# Patient Record
Sex: Female | Born: 1980 | Race: Black or African American | Hispanic: No | Marital: Single | Smoking: Current every day smoker
Health system: Southern US, Community
[De-identification: ages and names within clinical notes are randomized; demographics above are authoritative.]

## PROBLEM LIST (undated history)

## (undated) DIAGNOSIS — R569 Unspecified convulsions: Secondary | ICD-10-CM

## (undated) DIAGNOSIS — F445 Conversion disorder with seizures or convulsions: Secondary | ICD-10-CM

## (undated) DIAGNOSIS — K3184 Gastroparesis: Secondary | ICD-10-CM

## (undated) DIAGNOSIS — IMO0002 Reserved for concepts with insufficient information to code with codable children: Secondary | ICD-10-CM

## (undated) DIAGNOSIS — E119 Type 2 diabetes mellitus without complications: Secondary | ICD-10-CM

## (undated) DIAGNOSIS — M359 Systemic involvement of connective tissue, unspecified: Secondary | ICD-10-CM

## (undated) DIAGNOSIS — M329 Systemic lupus erythematosus, unspecified: Secondary | ICD-10-CM

## (undated) HISTORY — PX: CHOLECYSTECTOMY: SHX55

## (undated) HISTORY — PX: APPENDECTOMY: SHX54

---

## 2004-03-25 ENCOUNTER — Emergency Department: Payer: Self-pay | Admitting: Emergency Medicine

## 2004-05-20 ENCOUNTER — Emergency Department: Payer: Self-pay | Admitting: Internal Medicine

## 2004-05-24 ENCOUNTER — Inpatient Hospital Stay: Payer: Self-pay | Admitting: Anesthesiology

## 2007-07-20 ENCOUNTER — Ambulatory Visit: Payer: Self-pay | Admitting: Gynecology

## 2007-07-22 ENCOUNTER — Ambulatory Visit (HOSPITAL_COMMUNITY): Admission: RE | Admit: 2007-07-22 | Discharge: 2007-07-22 | Payer: Self-pay | Admitting: Gynecology

## 2007-07-23 ENCOUNTER — Ambulatory Visit: Payer: Self-pay | Admitting: Gynecology

## 2007-07-29 ENCOUNTER — Ambulatory Visit (HOSPITAL_COMMUNITY): Admission: RE | Admit: 2007-07-29 | Discharge: 2007-07-29 | Payer: Self-pay | Admitting: Gynecology

## 2007-08-18 ENCOUNTER — Ambulatory Visit (HOSPITAL_COMMUNITY): Admission: RE | Admit: 2007-08-18 | Discharge: 2007-08-18 | Payer: Self-pay | Admitting: Gynecology

## 2007-08-26 ENCOUNTER — Inpatient Hospital Stay (HOSPITAL_COMMUNITY): Admission: AD | Admit: 2007-08-26 | Discharge: 2007-08-26 | Payer: Self-pay | Admitting: Obstetrics & Gynecology

## 2007-09-01 ENCOUNTER — Emergency Department: Payer: Self-pay | Admitting: Emergency Medicine

## 2007-09-17 ENCOUNTER — Ambulatory Visit: Payer: Self-pay | Admitting: Obstetrics & Gynecology

## 2007-09-17 ENCOUNTER — Encounter (INDEPENDENT_AMBULATORY_CARE_PROVIDER_SITE_OTHER): Payer: Self-pay | Admitting: Gynecology

## 2007-09-27 ENCOUNTER — Emergency Department: Payer: Self-pay | Admitting: Emergency Medicine

## 2007-09-28 ENCOUNTER — Inpatient Hospital Stay (HOSPITAL_COMMUNITY): Admission: AD | Admit: 2007-09-28 | Discharge: 2007-10-01 | Payer: Self-pay | Admitting: Obstetrics & Gynecology

## 2007-09-28 ENCOUNTER — Ambulatory Visit: Payer: Self-pay | Admitting: Family Medicine

## 2007-10-08 ENCOUNTER — Ambulatory Visit: Payer: Self-pay | Admitting: Obstetrics & Gynecology

## 2007-10-14 ENCOUNTER — Ambulatory Visit (HOSPITAL_COMMUNITY): Admission: RE | Admit: 2007-10-14 | Discharge: 2007-10-14 | Payer: Self-pay | Admitting: Gynecology

## 2007-10-20 ENCOUNTER — Ambulatory Visit: Payer: Self-pay | Admitting: Obstetrics and Gynecology

## 2007-10-27 ENCOUNTER — Ambulatory Visit (HOSPITAL_COMMUNITY): Admission: RE | Admit: 2007-10-27 | Discharge: 2007-10-27 | Payer: Self-pay | Admitting: Gynecology

## 2007-11-04 ENCOUNTER — Ambulatory Visit: Payer: Self-pay | Admitting: Obstetrics & Gynecology

## 2007-11-10 ENCOUNTER — Emergency Department: Payer: Self-pay

## 2007-11-11 ENCOUNTER — Inpatient Hospital Stay (HOSPITAL_COMMUNITY): Admission: AD | Admit: 2007-11-11 | Discharge: 2007-11-11 | Payer: Self-pay | Admitting: Obstetrics and Gynecology

## 2007-11-12 ENCOUNTER — Inpatient Hospital Stay (HOSPITAL_COMMUNITY): Admission: AD | Admit: 2007-11-12 | Discharge: 2007-11-14 | Payer: Self-pay | Admitting: Family Medicine

## 2007-11-12 ENCOUNTER — Ambulatory Visit: Payer: Self-pay | Admitting: Family Medicine

## 2007-11-15 ENCOUNTER — Inpatient Hospital Stay (HOSPITAL_COMMUNITY): Admission: AD | Admit: 2007-11-15 | Discharge: 2007-11-20 | Payer: Self-pay | Admitting: Obstetrics & Gynecology

## 2007-11-15 ENCOUNTER — Ambulatory Visit: Payer: Self-pay | Admitting: Family Medicine

## 2007-12-03 ENCOUNTER — Ambulatory Visit: Payer: Self-pay | Admitting: Obstetrics & Gynecology

## 2007-12-17 ENCOUNTER — Inpatient Hospital Stay (HOSPITAL_COMMUNITY): Admission: AD | Admit: 2007-12-17 | Discharge: 2007-12-22 | Payer: Self-pay | Admitting: Obstetrics & Gynecology

## 2007-12-22 ENCOUNTER — Ambulatory Visit: Payer: Self-pay | Admitting: Family Medicine

## 2007-12-22 ENCOUNTER — Inpatient Hospital Stay (HOSPITAL_COMMUNITY): Admission: AD | Admit: 2007-12-22 | Discharge: 2007-12-29 | Payer: Self-pay | Admitting: Gynecology

## 2007-12-30 ENCOUNTER — Inpatient Hospital Stay (HOSPITAL_COMMUNITY): Admission: AD | Admit: 2007-12-30 | Discharge: 2007-12-31 | Payer: Self-pay | Admitting: Obstetrics & Gynecology

## 2008-01-11 ENCOUNTER — Encounter: Admission: RE | Admit: 2008-01-11 | Discharge: 2008-01-11 | Payer: Self-pay | Admitting: Obstetrics & Gynecology

## 2008-01-11 ENCOUNTER — Ambulatory Visit: Payer: Self-pay | Admitting: Family Medicine

## 2008-01-11 LAB — CONVERTED CEMR LAB
Hgb A1c MFr Bld: 5.6 % (ref 4.6–6.1)
Platelets: 232 10*3/uL (ref 150–400)
RDW: 13.5 % (ref 11.5–15.5)

## 2008-01-14 ENCOUNTER — Ambulatory Visit: Payer: Self-pay | Admitting: Obstetrics & Gynecology

## 2008-01-14 ENCOUNTER — Inpatient Hospital Stay (HOSPITAL_COMMUNITY): Admission: AD | Admit: 2008-01-14 | Discharge: 2008-01-17 | Payer: Self-pay | Admitting: Obstetrics & Gynecology

## 2008-01-14 ENCOUNTER — Ambulatory Visit: Payer: Self-pay | Admitting: Family Medicine

## 2008-01-19 ENCOUNTER — Ambulatory Visit: Payer: Self-pay | Admitting: Family Medicine

## 2008-01-19 ENCOUNTER — Inpatient Hospital Stay (HOSPITAL_COMMUNITY): Admission: AD | Admit: 2008-01-19 | Discharge: 2008-01-26 | Payer: Self-pay | Admitting: Obstetrics & Gynecology

## 2008-02-05 ENCOUNTER — Inpatient Hospital Stay (HOSPITAL_COMMUNITY): Admission: AD | Admit: 2008-02-05 | Discharge: 2008-02-08 | Payer: Self-pay | Admitting: Obstetrics & Gynecology

## 2008-02-09 ENCOUNTER — Ambulatory Visit: Payer: Self-pay | Admitting: Advanced Practice Midwife

## 2008-02-09 ENCOUNTER — Inpatient Hospital Stay (HOSPITAL_COMMUNITY): Admission: AD | Admit: 2008-02-09 | Discharge: 2008-02-09 | Payer: Self-pay | Admitting: Obstetrics & Gynecology

## 2008-02-17 ENCOUNTER — Observation Stay: Payer: Self-pay | Admitting: Obstetrics and Gynecology

## 2008-02-20 ENCOUNTER — Inpatient Hospital Stay: Payer: Self-pay | Admitting: Obstetrics and Gynecology

## 2008-02-23 ENCOUNTER — Inpatient Hospital Stay: Payer: Self-pay | Admitting: Obstetrics and Gynecology

## 2008-04-18 ENCOUNTER — Emergency Department: Payer: Self-pay | Admitting: Emergency Medicine

## 2008-04-20 ENCOUNTER — Emergency Department: Payer: Self-pay | Admitting: Emergency Medicine

## 2008-08-26 ENCOUNTER — Emergency Department: Payer: Self-pay | Admitting: Emergency Medicine

## 2009-01-04 ENCOUNTER — Inpatient Hospital Stay: Payer: Self-pay | Admitting: Internal Medicine

## 2009-01-09 ENCOUNTER — Inpatient Hospital Stay: Payer: Self-pay | Admitting: Internal Medicine

## 2009-01-23 ENCOUNTER — Emergency Department: Payer: Self-pay | Admitting: Emergency Medicine

## 2009-04-09 ENCOUNTER — Observation Stay: Payer: Self-pay | Admitting: Internal Medicine

## 2009-04-11 ENCOUNTER — Emergency Department: Payer: Self-pay | Admitting: Emergency Medicine

## 2009-04-12 ENCOUNTER — Inpatient Hospital Stay: Payer: Self-pay | Admitting: Internal Medicine

## 2009-04-25 ENCOUNTER — Inpatient Hospital Stay: Payer: Self-pay | Admitting: Internal Medicine

## 2009-12-11 ENCOUNTER — Emergency Department: Payer: Self-pay | Admitting: Emergency Medicine

## 2010-03-22 ENCOUNTER — Emergency Department: Payer: Self-pay | Admitting: Emergency Medicine

## 2010-03-25 ENCOUNTER — Ambulatory Visit: Payer: Self-pay | Admitting: Oncology

## 2010-04-05 ENCOUNTER — Emergency Department: Payer: Self-pay | Admitting: Emergency Medicine

## 2010-04-06 ENCOUNTER — Inpatient Hospital Stay: Payer: Self-pay | Admitting: Internal Medicine

## 2010-04-12 LAB — PATHOLOGY REPORT

## 2010-04-25 ENCOUNTER — Ambulatory Visit: Payer: Self-pay | Admitting: Oncology

## 2010-05-14 IMAGING — US US ABDOMEN COMPLETE
1 series · 13 of 25 positions shown · non-contrast
Comparison: 09/29/2007

CLINICAL DATA: Right upper quadrant abdominal pain.  Late second
trimester pregnancy.  Hyper emesis.

ABDOMEN ULTRASOUND
TECHNIQUE: Complete abdominal ultrasound examination was performed
including evaluation of the liver, gallbladder, bile ducts,
pancreas, kidneys, spleen, IVC, and abdominal aorta.

[Series 1: us abdomen complete · 13 of 106 slices shown]
[im 1/106]
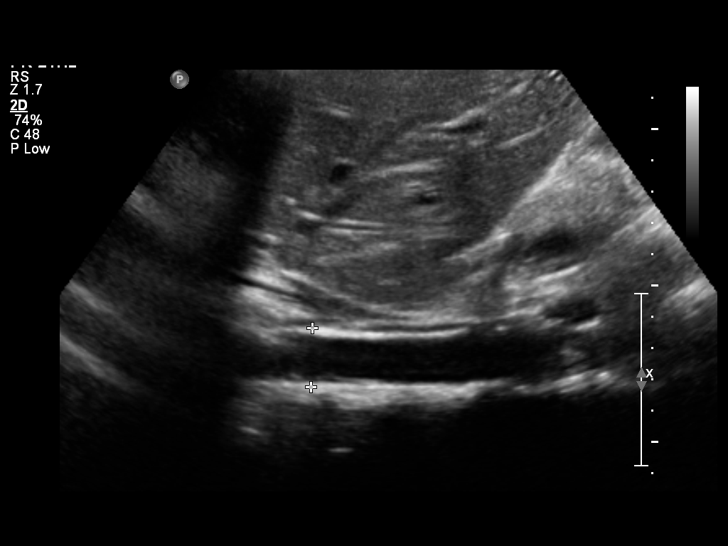
[im 9/106]
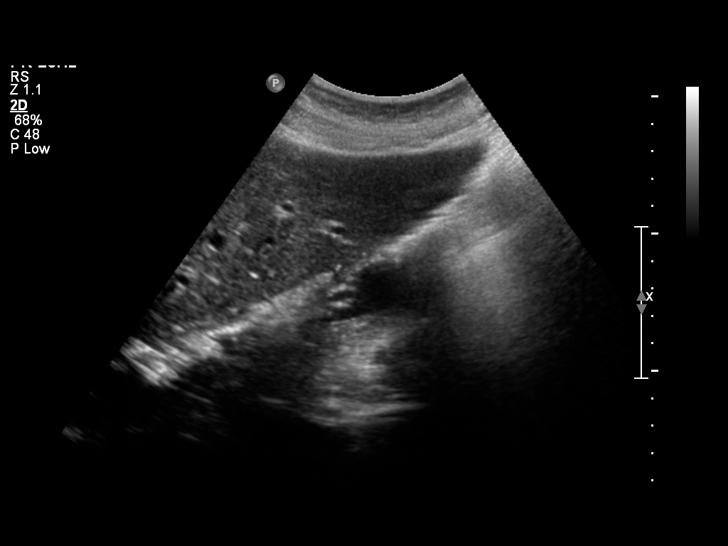
[im 18/106]
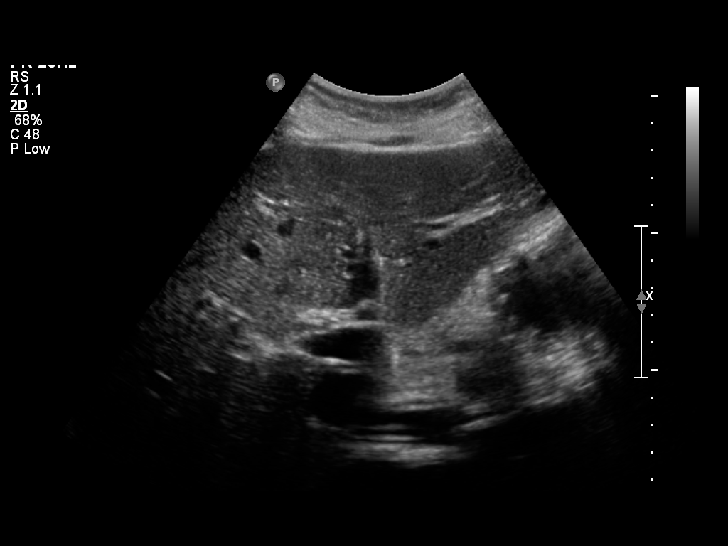
[im 27/106]
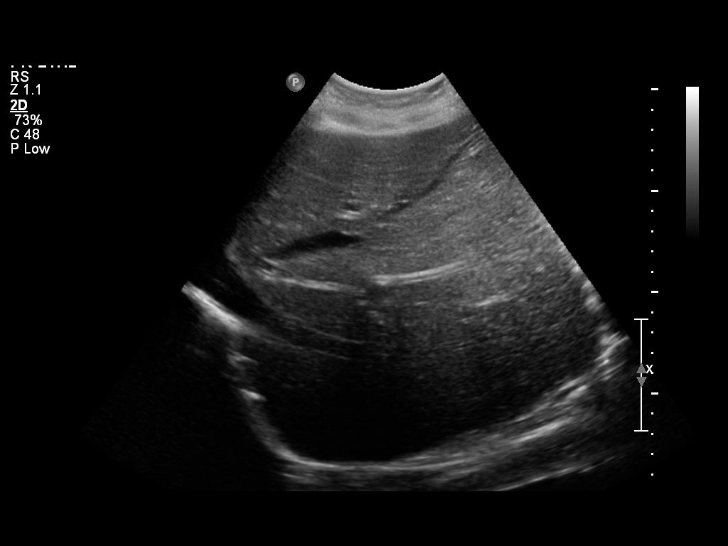
[im 36/106]
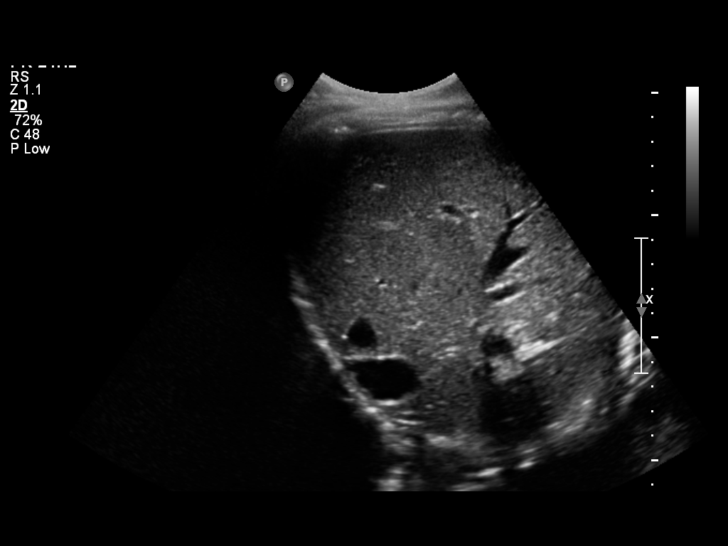
[im 44/106]
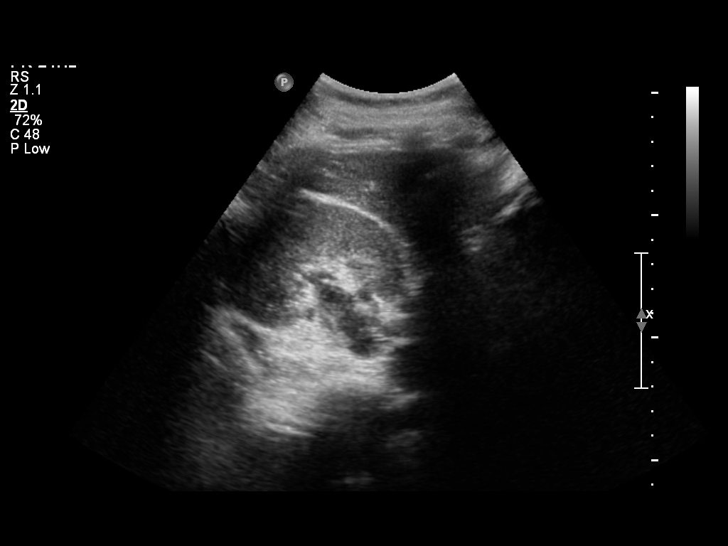
[im 53/106]
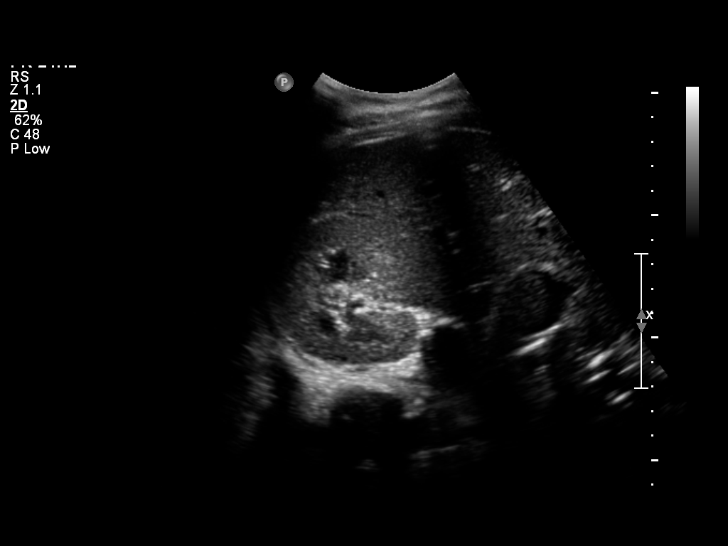
[im 62/106]
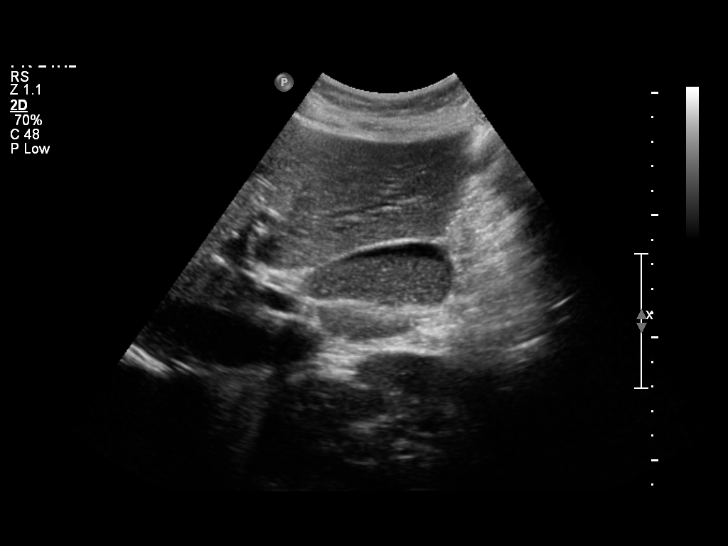
[im 71/106]
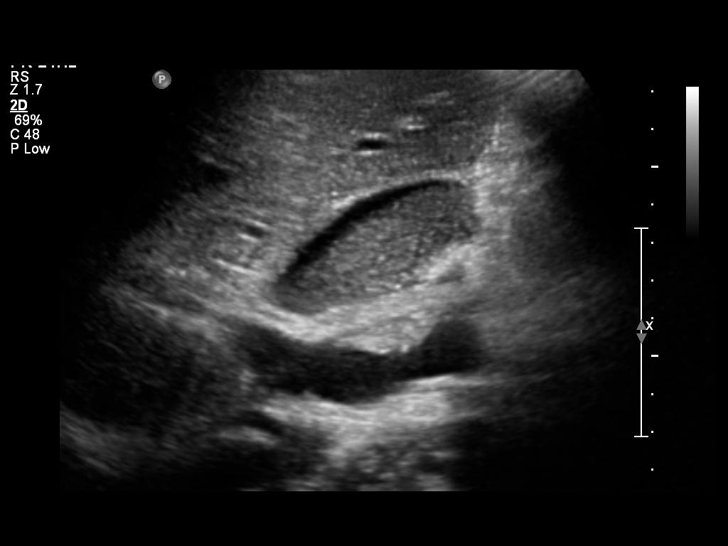
[im 79/106]
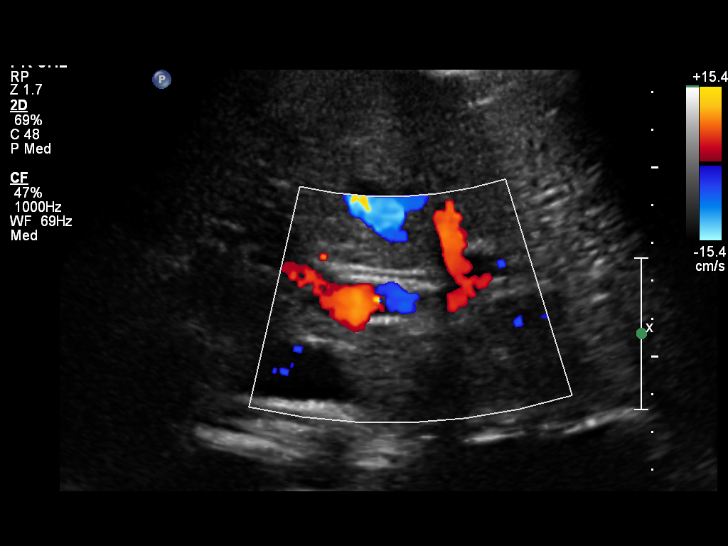
[im 88/106]
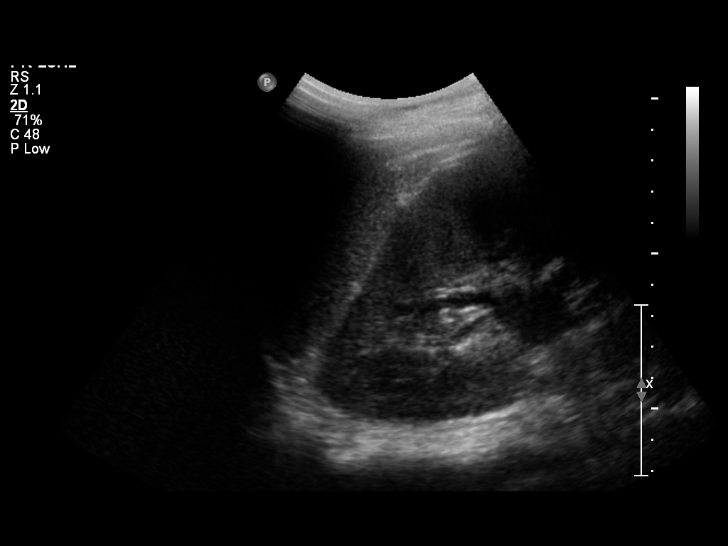
[im 97/106]
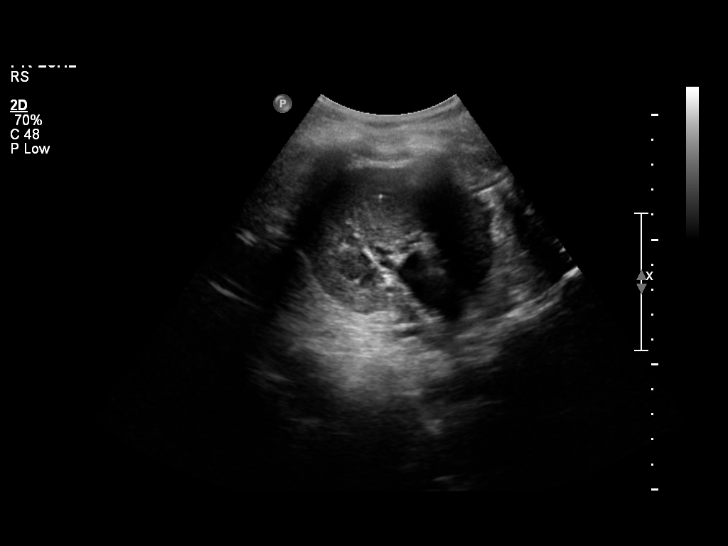
[im 106/106]
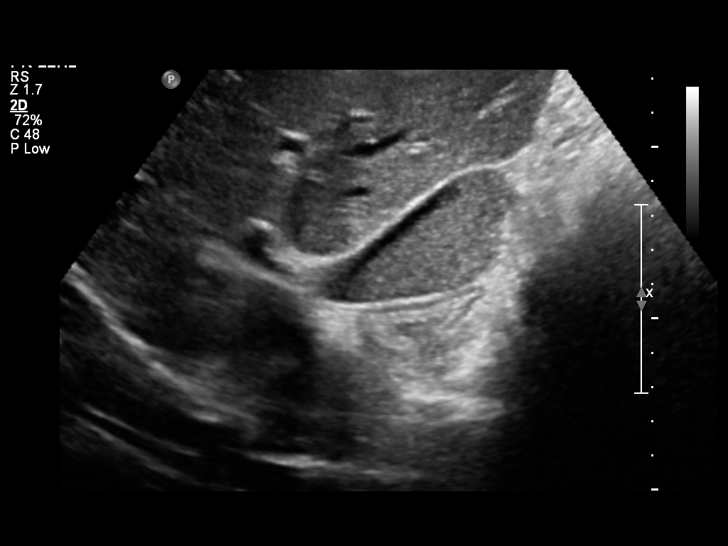

[13 of 25 positions shown; findings below may reference images not displayed]

FINDINGS: The aorta demonstrates expected tapering course.  The
pancreas appears unremarkable.

No discrete hepatic lesion is identified.  The common bile duct
measures 3 mm in diameter.

There is considerable sludge throughout the gallbladder, nearly
filling the gallbladder.  Gallbladder wall measures 3 mm in
diameter, borderline for gallbladder wall thickening.  No directly
visualized choledocholithiasis identified.  No shadowing
hyperechogenicity in the gallbladder to suggest gallstone.

The spleen appears unremarkable.

There is borderline left hydronephrosis and mild right
hydronephrosis.  No renal calculi are identified.  No renal mass
noted. The right kidney measures 13.9 cm in greatest length and the
left kidney measures 13.3 cm in greatest length.

The upper IVC appears unremarkable.
IMPRESSION: 1.  Prominent sludge in the gallbladder. Borderline gallbladder
wall thickening.  No directly visualized gallstones or biliary
dilatation.  Sonographic Murphy's sign is absent.
2.  Mild right hydronephrosis.

## 2010-08-07 NOTE — Discharge Summary (Signed)
[name], [name]            ACCOUNT NO.:  [id number]   MEDICAL RECORD NO.:  [id number]          PATIENT TYPE:  INP   LOCATION:  9305                          [facility]ACILITY:  [facility]   PHYSICIAN:  Norton Blizzard, MD    DATE OF BIRTH:  02-Sep-1980   DATE OF ADMISSION:  11/15/2007  DATE OF DISCHARGE:  11/20/2007                               DISCHARGE SUMMARY   REASON FOR HOSPITALIZATION:  [name] is a 30 year old  gravida 3, para 1-0-1-1 with approximately 58 weeks' gestational age who  was admitted with worsening right upper quadrant pain and nausea and  vomiting with a known history of cholestasis/cholelithiasis.  She has  had previous admissions for such similar problems and this admission is  related to worsening of her pain, nausea by related to her gallbladder.   HOSPITAL COURSE:  The patient was admitted and made n.p.o., started on  IV fluids.  Of note, due to her frequent vomiting, she had a potassium  of 2.0.  Her potassium was replaced with numerous runs of potassium  chloride IV.  Her nausea and vomiting were treated with numerous IV  medications to include Zofran, Protonix, Pepcid and Phenergan.  Additionally, the patient is an insulin-dependent diabetic; however, due  to her nutritional status and n.p.o. status, she required a little to no  insulin during the hospitalization.  Over period of several days, her  nausea and vomiting improved and she tolerated a regular carbohydrate-  modified low-fat diet.  At the time of discharge, the patient was  tolerating a full diet and had minimal to no right upper quadrant pain.   DISCHARGE MEDICATIONS:  1. Pepcid 20 mg twice daily.  2. Phenergan 25 mg orally as needed.  3. Her prenatal vitamins.  4. Transdermal scopolamine.  5. Zofran oral dissolving tablet 3 times a day as needed.  6. Her previous insulin dosages.   FINDINGS:  As per hospital course.   CONDITION AT DISCHARGE:  Good.   OPERATIONS AND PROCEDURES:   None.   DISCHARGE DIAGNOSES:  1. Hyperemesis gravidarum.  2. Cholestasis/cholelithiasis.  3. Hypokalemia.  4. Insulin-dependent diabetes mellitus.   INSTRUCTIONS FOR THE PATIENT:  The patient was instructed on use of her  medications as well as appointment for follow-up with [facility]  at [facility] on Monday, November 23, 2007, at 8:45 in morning.  The  patient voiced understanding of these instructions and all her questions  were answered.      Odie Sera, DO  Electronically Signed     ______________________________  Norton Blizzard, MD    MC/MEDQ  D:  01/18/2008  T:  01/18/2008  Job:  [id number]

## 2010-08-07 NOTE — Discharge Summary (Signed)
Rachael Gray, Rachael Gray            ACCOUNT NO.:  [id number]   MEDICAL RECORD NO.:  [id number]          PATIENT TYPE:  INP   LOCATION:  9158                          [facility]ILITY:  [facility]   PHYSICIAN:  Odie Sera, DO      DATE OF BIRTH:  February 12, 1981   DATE OF ADMISSION:  01/19/2008  DATE OF DISCHARGE:  01/26/2008                               DISCHARGE SUMMARY   REASON FOR ADMISSION:  [name] is a 30 year old gravida 3, para 1-0-1-  1 who is at 30 weeks and 3 days gestational age on the day of discharge  with a known history of chronic hyperemesis gravidarum most likely  secondary to chronic cholestasis gallbladder disease.  On the day of  admission, she presented to the MAU with worsening right upper quadrant  pain and inability to tolerate anything orally.  She had been previously  admitted and discharged approximately 3 days before the current  admission.  Additionally, she reports that over the last 2-3 weeks, she  has lost approximately 15 pounds due to the inability to tolerate  anything significant in terms of nutrition.   HOSPITAL COURSE:  The patient was admitted and placed n.p.o., given IV  fluids.  She was started on Zosyn because of her marked right upper  quadrant pain and her previous admissions.  She improved after receiving  a few days of IV Zosyn.  She received approximately 5 days IV Zosyn and  in addition, a Dobbhoff tube was placed.  In the process of trying to  confirm appropriate placement of the Dobbhoff tube, the patient related  that she had an episode of emesis, felt the Dobbhoff tube in her mouth,  and pulled it out.  Another Dobbhoff tube was then placed and  appropriate position was confirmed after advancing it several  centimeters and repeated an x-ray.  Tube feeds began, the patient  received Jevity 1.2 with increasing rate up until a goal rate of 70 mL  per hour.  The patient tolerated tube feeds quite well; however, when  given 80 mL of free water, the  patient became nauseated and vomited and  this happened on several occasions.  It was difficult to explain this;  however, this was shown to happen on several occasions.  However, the  patient was able to tolerate liquids p.o. in addition to the tube feeds,  so she is getting adequate amount of free water in addition to her tube  feeds.  The patient improved significantly after tube feeds were  started.  Her energy level improved and on hospital day #8, the patient  was requesting discharge to home.  She will be going home with continued  tube feeds at home, Jevity 1.2 at 70 mL per hour with recommendation to  continue oral hydration, small sips throughout the day.   Her meds at discharge include Reglan liquid 10 mg every 6 hours,  Dilaudid liquid 1 mg every 6 hours as needed, Protonix 40 mg liquid  suspension daily, Zofran oral-dissolving tablet 8 mg every 8 hours.  I  recommend she use this round-the-clock instead of p.r.n.  Phenergan  25  mg tablets as needed.  The patient was previously diagnosed insulin-  dependent diabetes, has had normal blood sugars in the entire  hospitalization, so I recommended she continue to check her blood sugars  at home.   FINAL DIAGNOSES:  1. Chronic cholestasis.  2. Insulin-dependent diabetes mellitus.  3. Hyperemesis gravidarum.  4. Hypokalemia.  5. Hypomagnesemia.   PROCEDURE PERFORMED:  Dobbhoff insertion x2.   CONDITION AT DISCHARGE:  She was stable.   INSTRUCTIONS TO THE PATIENT:  As per hospital course.   For her medications, I had a lengthy discussion with her at the time of  discharge in regards to trying to eat solid foods with regards to eating  bland low-fat foods in very small portions and eating every 1-2 hours  and then slowly advancing her diet as tolerated.  The patient was given  a followup appointment in [facility] for Thursday, November 5 at  9:30 a.m.  The patient voiced understanding of all the above  instructions and  all her questions were answered.      Odie Sera, DO  Electronically Signed     MC/MEDQ  D:  01/26/2008  T:  01/26/2008  Job:  [id number]

## 2010-08-07 NOTE — Discharge Summary (Signed)
Rachael Gray, SCHOENBECK            ACCOUNT NO.:  [id number]   MEDICAL RECORD NO.:  [id number]         PATIENT TYPE:  WINP   LOCATION:  WOC                           [facility]   PHYSICIAN:  Norton Blizzard, MD    DATE OF BIRTH:  12-02-1980   DATE OF ADMISSION:  01/14/2008  DATE OF DISCHARGE:  01/17/2008                               DISCHARGE SUMMARY   REASON FOR HOSPITALIZATION:  The patient is a 30 year old gravida 3,  para 1-0-1-1 at 33 weeks' gestation age with known history of  hyperemesis, chronic cholecystitis and gallbladder sludge who was  admitted for worsening pain and inability to tolerate any food or liquid  intake. The patient was also noted to have 6-pound weight loss over 4  days.  There were no fetal complications or abnormal vital signs that  were noted.  Of note, the patient does have a history of type 2  diabetes, but her sugars were well controlled.   HOSPITAL COURSE:  Upon admission, the patient was put on clear liquids,  which she tolerated.  She had few episodes of emesis, but this was  controlled on Zofran and Reglan.  She was advanced to soft, bland, low-  fat, carbohydrate-modified diet.  Of note, the patient did manage to  bring in regular food in the form of fried chicken tenders and [place]  fries. She did report increase in her pain, but she was told that fatty  foods would lead to gallbladder pain.  She was advised to stick to the  recommended diet.  Her pain was controlled on oral pain medications.  The patient was supposed to receive Thorazine suppositories for her  nausea, but she refused this.  She was sent out with a prescription for  oral Thorazine to see if this would aid in treating her nausea.  She  does report that sublingual Zofran makes her bottom lip swell; however,  she does not have any reaction to the intravenous Zofran.  The patient  required significant amount of IV Dilaudid initially, which was switched  over to oral Dilaudid for  her pain and she will be discharged on this  medication.  At the time of discharge, she is not noted to have any  abdominal pain.  Her abdomen is soft and nontender.  The fetal heart  rate tracing is reactive with a baseline in the 140s.  No contractions  on tocometer.  She is tolerating a full low-fat, carbohydrate-modified  diet and desires discharge to home.   DISCHARGE CONDITION:  Stable.   DISCHARGE INSTRUCTIONS:  The patient was told to adhere to a low-fat,  carbohydrate-modified diet and to continue her insulin regimen, her  antiemetic regimen and analgesia regimen. Of note during her  hospitalization, her blood sugars were all within normal range.   DISCHARGE MEDICATIONS:  1. Dilaudid 2-4 mg p.o. q. 4 h p.r.n. pain.  2. Thorazine 25 mg p.o. q.6 h p.r.n. pain.   FOLLOW-UP APPOINTMENTS:  The patient does have a scheduled followup  appointment in the [facility] on January 18, 2008.     [name],  MD  Electronically Signed    UAD/MEDQ  D:  01/17/2008  T:  01/17/2008  Job:  [id number]

## 2010-08-07 NOTE — Consult Note (Signed)
Rachael Gray, Rachael Gray            ACCOUNT NO.:  [id number]   MEDICAL RECORD NO.:  [id number]          PATIENT TYPE:  INP   LOCATION:  9153                          [facility]  [facility]   PHYSICIAN:  Almond Lint, MD       DATE OF BIRTH:  1980/12/06   DATE OF CONSULTATION:  12/23/2007  DATE OF DISCHARGE:                                 CONSULTATION   REFERRING PHYSICIAN:  Odie Sera, DO   CHIEF COMPLAINT:  Abdominal pain, nausea, and vomiting.   HISTORY OF PRESENT ILLNESS:  Rachael Gray is a patient that is being taken  care at [facility].  She has had hyperemesis and issues with her  gallbladder.  She was seen by our service 4 weeks ago for questionable  gallstones.  At that point, she was able to eat and not having any  tenderness.  She was subsequently managed nonoperatively.  At this time,  she was readmitted for 4 days and was advanced to low-fat diet.  She  only went home for 5 hours before coming back to the emergency  department.  At that point, she had worsening right upper quadrant  abdominal pain and was unable to keep down food.   PAST MEDICAL HISTORY:  Significant for adult-onset diabetes.  Also, [redacted]  weeks pregnant.  History of endometriosis.   PAST SURGICAL HISTORY:  Significant for laparoscopic right salpingo-  oophorectomy for what she describes as a ruptured cyst and a diagnostic  laparoscopy for endometriosis.  She has also had a miscarriage.   SOCIAL HISTORY:  She does not smoke or drink alcohol or abuse drugs.  She lives with her mother and her older preteen child.   FAMILY HISTORY:  Significant for family history of gallbladder disease.  She does have family member with cancer and hypertension.   ALLERGIES:  LIQUID TYLENOL, causes a rash.   MEDICATIONS:  1. Pepcid 40 a day.  2. Phenergan 25 as needed.  3. Prenatal vitamins.  4. Novolin N 24 units once a day.  5. NovoLog subcu 10 units in the morning and 8 units in the evening.  6. Transdermal  scopolamine patch.  7. Potassium chloride.   REVIEW OF SYSTEMS:  Otherwise negative x11 systems.   PHYSICAL EXAMINATION:  VITAL SIGNS:  T-max was 99.1 on September 28,  otherwise 97.6, pulse 87, blood pressure 96/58, respirations 18, and 96%  on room air.  GENERAL:  She is alert and oriented x3, in no acute distress.  HEART:  Regular rate and rhythm.  LUNGS:  Clear.  ABDOMEN:  Soft, nontender, and nondistended. Gravid.  Specifically, the  right upper quadrant is nontender on deep palpation.  EXTREMITIES:  Warm and well perfused.  No pitting edema.   LABORATORY DATA:  She has LFTs from September 26, which were not  elevated.  UA is positive for ketones.  Repeat ultrasound is significant  for continued and increased amount of gallbladder sludge.  Positive  sonographic Rachael Gray sign was seen.   ASSESSMENT:  Rachael Gray is a 30 year old female, who is [redacted] weeks  pregnant with complaints of abdominal pain, nausea, and  vomiting.  One  thing that she has not ever tried is antibiotics for a potential low-  grade chronic cholecystitis.  Her ultrasound findings do not demonstrate  any wall thickening or pericholecystic fluid, but she does have  tenderness and nausea.  She is advised that the second trimester is a  better time to have surgery because there is a lower risk of preterm  labor.  She would like to try to avoid surgery as she has had a  miscarriage in the past and is very concerned about the outcome of her  child.  She was written for Zosyn.  She should get repeat labs including  LFTs and CBC.  As long as her pain continues to resolve, it is not clear  that all her nausea is related to her gallbladder.  However, I did  advise the patient that if she continues to have pain, I would recommend  taking out the gallbladder now rather than waiting until late in her  pregnancy.  This was discussed with Dr Noel Gerold.  We will try keeping her  NPO with ice chips today and potentially placing her on  clear liquids  tomorrow.  Should we elect to do a cholecystectomy, we will need to do  this during the day at [facility] with fetal monitoring and then send  her back to [facility] after her gallbladder is removed.  This is not a  procedure that we would do at night.  We would want to do it electively  during the day.      Almond Lint, MD  Electronically Signed     FB/MEDQ  D:  12/24/2007  T:  12/25/2007  Job:  [id number]

## 2010-08-07 NOTE — Discharge Summary (Signed)
NAMESWETHA, Rachael Gray            ACCOUNT NO.:  [id number]   MEDICAL RECORD NO.:  [id number]          PATIENT TYPE:  INP   LOCATION:  9316                          [facility]   PHYSICIAN:  Allie Bossier, MD        DATE OF BIRTH:  1980/05/22   DATE OF ADMISSION:  12/17/2007  DATE OF DISCHARGE:  12/22/2007                               DISCHARGE SUMMARY   REASON FOR ADMISSION:  Rachael Gray is a 30 year old gravida 3, para 1-0-1-  1 who presented at 10 weeks and 5/7 days gestational age with right  upper abdominal pain, vomiting and poor oral intake.  Rachael Gray has had  several hospitalizations in this pregnancy for hyperemesis as well as  known gallbladder disease.   HOSPITAL COURSE:  Rachael Gray was admitted and placed on n.p.o. for  approximately 24 hours.  She was given her medications mostly in IV  form.  She did have a moderate amount of abdominal pain as well as  nausea and vomiting during the first 24 hours of her hospitalization.  She had an abdominal ultrasound which revealed a prominent sludge in the  gallbladder with some borderline gallbladder wall thickening.  No  gallstones were visualized on this ultrasound.  Ultrasound date was  December 19, 2007.  Her symptoms improved and her diet was slowly  advanced over the next 2 days.  At the time of discharge, she is having  a little bit of which she describes as acid reflux.  However, is  tolerating a normal low-fat modified carbohydrates gestational diabetes-  type diet.  This was per the advice of Nutrition who also saw her during  this hospitalization.   SIGNIFICANT FINDINGS:  Ultrasound as reported above.   Her labs during this hospitalization were significant for a mildly low  potassium level in the range of 3.1-3.3.  Additionally, an OB ultrasound  was performed which showed an EFW 64 percentile and showed an estimated  due date consistent with her estimated due date of March 25, 2008, and  subjectively normal  amniotic fluid level and of the visualized anatomy,  all was normal.   MEDICATIONS AT DISCHARGE:  The patient will go home on the following  meds.  Of note, please note that she has prescriptions for all these  medications except for the Percocet and prescription was given.  1. Zofran 8 mg orally dissolving tablets every 8 hours as needed.  2. Reglan 10 mg 4 times daily.  3. Phenergan 25 mg every 6 hours as needed.  4. Scopolamine patch every 72 hours.  5. Pepcid 20 mg twice daily.  6. K-Dur tablets 20 mEq daily.  7. Insulin.  The patient has previously been on regimen of NovoLog and      Novolin, however, due to her poor intake related to her gallbladder      disease, she has been on a sliding scale.  She relates that she      only had to use insulin twice in the last week.  Her blood sugars      have been normal in this entire  hospitalization without any insulin      use.   FINAL DIAGNOSES:  1. Hyperemesis gravidarum.  2. Chronic cholecystitis.  3. Insulin-dependent diabetes mellitus.  4. Intrauterine pregnancy at 76 weeks' gestational age.   PLAN:  The patient will follow up in the [facility] on Monday,  December 28, 2007.  The patient will call for an appointment.  The patient  will take her medications as described above.  Additionally,  approximately 50 minutes was spent talking with the patient about  controlling her gallbladder symptoms with a low-fat modified  carbohydrate diet at home as well as discussion on plans to help calm  the gallbladder down in the event that she does develop more pain and  more vomiting.  My recommendation was to put herself n.p.o. for least 12  hours except for some Percocet and sips of water and if the pain does  not go away after 12-24 hours then we instructed her to come in for  evaluation.      Odie Sera, DO  Electronically Signed     ______________________________  Allie Bossier, MD    MC/MEDQ  D:  12/22/2007  T:   12/23/2007  Job:  [id number]

## 2010-08-07 NOTE — Consult Note (Signed)
Rachael Gray, Rachael Gray            ACCOUNT NO.:  [id number]   MEDICAL RECORD NO.:  [id number]          PATIENT TYPE:  INP   LOCATION:                                [facility]   PHYSICIAN:  Thornton Park. Daphine Deutscher, MD  DATE OF BIRTH:  [birth year]   DATE OF CONSULTATION:  11/17/2007  DATE OF DISCHARGE:                                 CONSULTATION   CHIEF COMPLAINT:  Gallstones during pregnancy with diabetes and  hyperemesis gravidarum.   HISTORY:  This is a 30 year old African American female gravida 3, para  1, who is 25 weeks' gestational age, was admitted on November 16, 2007,  with persistent nausea, vomiting, and some upper abdominal discomfort.  I am seeing her on November 17, 2007, in the afternoon, and this has all  resolved.  She is eating a tray of solids and seems to be doing fine  with that.  She had an ultrasound recently that showed gallstones, and  we talked about the management of these.   PHYSICAL EXAMINATION:  She is having no tenderness in her right upper  quadrant at the present time.   LABORATORY DATA:  She has some metabolic aberrations secondary to her  insulin-dependent diabetes mellitus, but otherwise is unremarkable.   I think that under the circumstances, I would try to manage her  nonoperatively.  I told her it would be good for me to see her in the  office after delivery, and we can schedule an elective cholecystectomy,  perhaps, 6-8 weeks following delivery.  In the meantime, we could try to  follow her symptomatically and try to avoid surgery with pregnancy.  Certainly if she develops an acute attack of cholecystitis at the time  of remitting, then we would consider lap chole at that time.   I gave her my name and told her I would happy to see her after her  delivery or should there be something that arises before then we could  see her and intervene.   IMPRESSION:  1. Twenty one weeks' pregnant.  2. Insulin-dependent diabetes mellitus.  3. Hyperemesis  gravidarum.  4. Gallstones.  No evidence of acute cholecystitis at this time.   PLAN:  Observation and hopefully, we will be able to get through her  pregnancy without anesthesia and surgical intervention.      Thornton Park Daphine Deutscher, MD  Electronically Signed     MBM/MEDQ  D:  11/17/2007  T:  11/18/2007  Job:  [id number]   cc:   Lazaro Arms, M.D.  Fax: [phone]

## 2010-08-07 NOTE — Discharge Summary (Signed)
NAMEANAIKA, SANTILLANO            ACCOUNT NO.:  [id number]   MEDICAL RECORD NO.:  [id number]          PATIENT TYPE:  INP   LOCATION:  9305                          FACILITY:  [facility]   PHYSICIAN:  Tanya S. Shawnie Pons, M.D.   DATE OF BIRTH:  1981/01/19   DATE OF ADMISSION:  11/12/2007  DATE OF DISCHARGE:  11/14/2007                               DISCHARGE SUMMARY   DISCHARGE DIAGNOSES:  1. Hyperemesis gravidarum.  2. Cholelithiasis.  3. Singleton intrauterine pregnancy at 41 weeks' gestation.  4. A2 gestational diabetic-requiring insulin.   REASON FOR HOSPITALIZATION:  Mrs. Rachael Gray is a 30 year old  gravida 3, para 1-0-1-1 who was admitted from the MAU due to several  days of repetitive emesis episodes and inability to tolerate any fluids,  keep fluids down or food.  Approximately 1 week before this admission,  she was seen in a different facility and diagnosis of cholelithiasis was  made by ultrasound.  She was admitted and started on IV fluids as well  as clear liquid diet.  She was given a combination of Zofran and Reglan  for her nausea which seemed to control her nausea.  Additionally, she  was given Percocet on 2 occasions which significantly improved her right  upper quadrant pain.  As a result of improved pain and improved nausea,  she was tolerating p.o.'s and eating a full diet, feeling much better,  and asking to go home.  I had a lengthy discussion with Mrs. Ohm in  regards to whether her nausea improved, because her gallbladder pain  improved or her nausea improved as a combination of the Zofran and  Reglan.  She will be sent out with a few Percocet, but encouraged not to  use them on a regular basis.  Additionally, she will be sent out with  prescriptions for Zofran and Reglan.   PERTINENT LABORATORY DATA:  Upon admission, her admission labs were  significant for urine with greater than 80 ketone urea, a serum  bilirubin of 1.5, and white count of 9.7.   Additionally, she had a TSH  of 0.131 with a free T3 of 1.9, which is slightly on the lower side and  a free T4 of 1.01   PROCEDURE PERFORMED:  None.   CONDITION ON DISCHARGE:  Good.   INSTRUCTIONS:  Instructions given to the patient, described above.  The  patient was given instructions on taking her medications as well as  discussion on potential etiology of her nausea and vomiting.  The  patient was also instructed to continue her previous outpatient insulin  regimen as well as followup with her OB/GYN doctors at [facility] sometime within the next week.      Odie Sera, DO  Electronically Signed     ______________________________  Shelbie Proctor. Shawnie Pons, M.D.    MC/MEDQ  D:  11/14/2007  T:  11/14/2007  Job:  [id number]

## 2010-08-07 NOTE — Discharge Summary (Signed)
Rachael Gray, Rachael Gray            ACCOUNT NO.:  [id number]   MEDICAL RECORD NO.:  [id number]          PATIENT TYPE:  INP   LOCATION:  9155                          [facility]ACILITY:  [facility]   PHYSICIAN:  Allie Bossier, MD        DATE OF BIRTH:  1980-09-13   DATE OF ADMISSION:  02/05/2008  DATE OF DISCHARGE:  02/08/2008                               DISCHARGE SUMMARY   ATTENDING PHYSICIAN AT DISCHARGE:  Allie Bossier, MD   PRIMARY CARE PHYSICIAN:  [facility] at the [facility] in  [place].   DISCHARGE DIAGNOSES:  1. Chronic cholestasis.  2. Urinary tract infection, polymicrobial.  3. Insulin-dependent diabetes mellitus.  4. Hyperemesis gravidarum.  5. Hypokalemia.  6. Hypomagnesemia.   PROCEDURES PERFORMED DURING HOSPITALIZATION:  None.   HISTORY OF PRESENT ILLNESS:  The patient is a 30 year old G3, P 1-0-1-1  at approximately 33 weeks and 3 days gestation.  The patient had arrived  with EMS after reporting a near-syncopal episode on the day of  admission.  At that time, the patient stated that her mother had felt  that she needed to be checked out due to patient blacking out.  The  patient reported continued vomiting per previous reports.  Apparently,  the patient had reported vomiting twice on the day prior to admission,  in addition to two times on the day of admission.  She had been eating  poorly.   HOSPITAL COURSE:  1. Chronic cholestasis:  At this point, given the patient's history of      cholestasis, in addition to her hypokalemia, it was felt that these      were potential reasons why the patient had near-syncopal episodes.      She was placed on a bland diet and slowly progressed to increased      food intake.  Prior admissions required the patient has a      nasogastric Dobbhoff tube placed.  during this hospitalization, the      patient was able to take in by mouth without much vomiting.  During      this hospitalization within the last 2 days, the patient  vomited a      small amount only a single time and was tolerating a general diet.      On the day of discharge, the examiner noted that the patient was      eating potato chips on the final visit.  At this point, the patient      will be discharged with antinausea medication and instructions to      eat smaller meals in addition to blander foods, in addition to      avoiding fatty foods.  Please see discharge medications for      antiemetic drugs.  2. Urinary tract infection:  On the day of admission, the patient had      a urinalysis, which demonstrated positive nitrites in addition to      trace leukocyte esterase.  The patient was placed on Rocephin and      received approximately 7 doses of Rocephin during  her      hospitalization.  Urine culture returned back as polymicrobial.      There was no evidence of any GBS present.  At this point, it was      felt to be appropriately treated with 7 doses of Rocephin and no      further antibiotics will be given.  3. Insulin-dependent diabetes:  Also a potential reason that the      patient was having near-syncopal episodes.  During the      hospitalization, the patient maintained adequate CBGs in the range      of 70-110 on current insulin regimen.  Please see discharge      medications for current insulin regimen.  We will not change as      success is currently present.  I advised the patient of      hypoglycemic symptoms and urged the patient to maintain a low-      refined sugar diet.  I stressed the importance of protein and whole      wheats in her diet and an attempt to maintain blood sugars at      appropriate levels.  4. History of hyperemesis gravidarum:  At this point, it seems to be      controlled.  Likely a second component of her nausea.  We will      attempt to be controlled with smaller portion sizes in diet,      medications per below, and blander diet.  5. Hypokalemia:  On the day of admission, the patient had a  potassium      level of 2.6.  This was supplemented with oral potassium in      addition to IV potassium.  The level rose to 3.2.  On the day of      discharge, the patient's level had dropped back down to      approximately 2.8.  Four runs of potassium were given.  At this      point, due the patient's repeated success with this, no further      potassium level was checked prior to discharge.  She will be      discharged with 20 mEq potassium p.o. b.i.d.  Please see below.  6. Hypomagnesemia:  The patient had a magnesium level of approximately      1.7 and was supplemented appropriately.  No repeat value was had      given the patient's prior success with magnesium supplementation.   DISCHARGE DISPOSITION:  To home in improved and stable condition,  pregnant at [redacted] weeks and 3 days.   DISCHARGE MEDICATIONS:  1. Pepcid 40 mg p.o. daily #30, no refills.  2. Potassium chloride 20 mEq p.o. b.i.d. #60, no refills.  3. Zofran 8 mg p.o. q.8 h. p.r.n. for nausea #30, no refills.  4. Prenatal vitamins, take one tablet by mouth daily while breast-      feeding, #30.  5. Colace 100 mg take one tablet by mouth b.i.d. as needed for      constipation.  6. Dilaudid 2 mg p.o. q.4 h. p.r.n. for pain #30, no refills.  7. Phenergan 25 mg p.o. q.4-6 h. p.r.n. for nausea #30, no refills.  8. Reglan 10 mg p.o., take two times per day as needed for nausea.  9. Insulin schedule will be as follows, Novolin N subcutaneous 24      units subcutaneously daily.  10.NovoLog subcutaneously 10 units subcu a.m.  11.NovoLog subcutaneous 8 units  nightly.   The patient is to take blood sugars a.m. fasting, 2 hours postprandially  in the morning, 2 hours post lunch, 2 hours post dinner and one time  prior to bed, monitoring sugars appropriately.   Followup appointments will be with [facility] in approximately 1  week.  The patient is to schedule appointment.   DISCHARGE DIET:  Parke Simmers foods, avoiding fatty  foods:  Extensive  conversation has been had with the patient regarding diet changes  necessary at this point.   DISCHARGE INSTRUCTIONS:  The patient is to return to MAU for worsening  pain in her gallbladder out of proportion to her prior episodes.  Additionally, hypoglycemic symptoms were discussed with the patient and  the  patient is to call clinic for further advice with insulin regimen if  hypoglycemia takes place.  The patient is to take potassium levels as  follows per above and check a potassium level in the [facility] in  approximately in 1 week.     ______________________________  Obstetrics Resident      Allie Bossier, MD  Electronically Signed    OR/MEDQ  D:  02/08/2008  T:  02/09/2008  Job:  [id number]   cc:   Allie Bossier, MD

## 2010-08-07 NOTE — Discharge Summary (Signed)
NAMEALAILA, Gray            ACCOUNT NO.:  [id number]   MEDICAL RECORD NO.:  [id number]          PATIENT TYPE:  INP   LOCATION:  9153                          [facility]ACILITY:  [facility]   PHYSICIAN:  Lesly Dukes, M.D. DATE OF BIRTH:  [birth year]   DATE OF ADMISSION:  12/22/2007  DATE OF DISCHARGE:  12/29/2007                               DISCHARGE SUMMARY   REASON FOR HOSPITALIZATION:  [name] is a 30 year old  gravida 3, para 1-0-1-1 at 25 weeks' gestational age with a known  history of chronic cholecystitis/gallbladder sludge that was admitted  for worsening pain and inability to tolerate any food or drinks.  She  presented to the Maternity Admissions Unit several hours after a recent  discharge with worsening right upper quadrant pain and vomiting.   HOSPITAL COURSE:  She was admitted and placed n.p.o., it took several  days for pain to decrease, and her vomiting stopped.  She had multiple  episodes of bilious emesis approximately 800-900 mL at a time.  General  Surgery was consulted who recommended Zosyn.  The patient has begun to  feel better, and her diet was advanced slowly.  She slowly improved on  her medication regimen, which included Pepcid 20 mg b.i.d., Reglan 10 mg  4 times daily, transdermal scopolamine patch, Zofran 4 mg IV, which was  then changed to 8 mg dissolving tablets, and Phenergan 25 mg orally  every 6 hours.  The patient required a moderate mount of Dilaudid while  she was here; however 1 day prior to admission, she was changed to an  oral Percocet and has not had one since 2:00 p.m. on the day prior to  discharge.  At the time of discharge, she has no abdominal pain.  Her  abdomen is soft and nontender.  Uterus is gravid and consistent with  dates.  She is tolerating a full low-fat, carbohydrate-modified diet and  is desiring discharge.   FINAL DIAGNOSES:  1. Chronic cholecystitis.  2. Insulin-dependent diabetes mellitus.   INSTRUCTIONS FOR  THE PATIENT:  The patient was discharged on the  medications as described above.  I recommended that she routinely use  the Pepcid and the Reglan and the transdermal scopolamine with the  addition of the Zofran and Phenergan on as-needed basis.  I have  strongly urged her to maintain a good low-fat, carbohydrate-modified  diet to include 3 small meals a day and 3 snacks.  Because of her  decreased intake, her blood sugars have remained normal throughout this  hospitalization without any use of insulin except for the day of  discharge, which her a.m. insulin was 129.  Based on this, I recommended  she resume her insulin at approximately half a dose previously so that  would be Novolin N 10 units and NovoLog 5 units every morning and  NovoLog 4 units every night at dinner time.  She was told to continue  monitoring her blood pressures fasting, 2 hours postprandial, and at  bedtime.  She will follow up at the [facility] on Monday, March 05, 2008.  Of note, the patient states she  still has a prescription of Percocet from last hospitalization, which  she will use for pain control on an as-needed basis.  The patient  expressed a clear understanding of all of these instructions and will be  given a written version of these instructions as well.      Rachael Sera, DO      Lesly Dukes, M.D.  Electronically Signed    MC/MEDQ  D:  12/29/2007  T:  12/29/2007  Job:  [id number]

## 2010-08-07 NOTE — Discharge Summary (Signed)
Rachael Gray, Rachael Gray            ACCOUNT NO.:  [id number]   MEDICAL RECORD NO.:  [id number]          PATIENT TYPE:  INP   LOCATION:                                [facility]  [facility]   PHYSICIAN:  Karlton Lemon, MD      DATE OF BIRTH:  1981/03/04   DATE OF ADMISSION:  09/28/2007  DATE OF DISCHARGE:  10/01/2007                               DISCHARGE SUMMARY   ADMISSION DIAGNOSES:  1. Intrauterine pregnancy at 14 weeks 3 days gestation.  2. Gastroenteritis.  3. Diabetes mellitus type 2.  4. Dehydration.  5. Hypokalemia repleted.  6. Trichomonas vaginalis.   DISCHARGE DIAGNOSES:  1. Intrauterine pregnancy at 14 weeks 6 days gestation.  2. Gastroenteritis, symptoms improved.  3. Diabetes mellitus type 2.  4. Dehydration, resolved.  5. Hypokalemia repleted.  6. Trichomonas vaginalis status post treatment with Flagyl.   PROCEDURES:  The patient had a complete abdominal ultrasound performed  on September 29, 2007 showing normal gallbladder without gallstones,  gallbladder wall thickening or pericholecystic fluid, bowel duct was  within normal limits.  Liver, inferior vena cava, pancreas, spleen,  right and left kidney and the abdominal wall unremarkable.   CONSULTATIONS:  None.   COMPLICATIONS:  None.   PERTINENT LABORATORY FINDINGS:  Upon admission Rachael Gray had a CBC with  white blood cell count 10.4, hemoglobin 11.2, hematocrit 32.5, and  platelets 204.  Complete metabolic panel showed sodium 134, potassium  2.5. chloride 99, CO2 28, glucose 135, BUN 4, creatinine 0.39, AST 20,  ALT 13, total bilirubin 0.9, amylase was 150, and lipase was 31.  After  initial repletion with potassium the level improved to 2.8.  Later on  the day of admission, hospital day #3, potassium was 3.3.  A repeat  complete blood count showed white blood cells 7.9, hemoglobin 10.6,  hematocrit 31.1, and platelets 174.   BRIEF PERTINENT ADMISSION HISTORY:  Rachael Gray is a 30 year old gravida  3 para 1-0-1-1  at 14 weeks 3 days gestation by an 8-week ultrasound  presented to the [facility] office in [place] with  weight loss, nausea, vomiting, and dehydration.  The patient does have  known type 2 diabetes for which she is managed with NPH and regular  insulin.  She had started throwing up 48 hours prior to presentation in  the clinic.  She was unable to keep any food in her stomach without  vomiting.  She had a 16-pound weight loss over 11 days.  She notes no  diarrhea.  There were no other infectious symptoms such as fever, rash,  upper respiratory infections.  The patient was to be admitted for IV  hydration, antiemetics, and blood sugar control.   HOSPITAL COURSE:  Rachael Gray was admitted and started on IV hydration  with IV bolus of lactated Ringer's followed by 150 milliliters an hour.  Labs were drawn and the patient was found to have a potassium of 2.5.  At this time, potassium was replaced with IV repletion.  Her potassium  did improve from 2.5 initially to 3.3 on hospital day #3.  Her symptoms  of nausea and vomiting were treated symptomatically with Zofran and  Phenergan with fairly good control.  Protonix was used for any reflux  symptoms and Robinul was used to help with spitting that she may have  had.  She did continue to vomit during her stay as many as four times a  day.  Her diet was to be advanced as tolerated and she tolerated clears  at times throughout her first 2 days of admission.  Her blood sugars  were controlled with modified insulin regimen beginning at 12 units [place]  a.m. and NPH and 4 units [place] at bedtime.  She was started on sliding  scale insulin of regular insulin.  Her blood sugars throughout her stay  initially ranged in the 190, 193 postprandial.  The first fasting was  137, this improved to fasting of 70 on day of discharge with her  postprandial the day prior to discharge being 70, 75, and 100.  The  patient was also treated with IV Flagyl  during her stay due to  Trichomonas that was identified on her Pap smear at her initial prenatal  visit.  On day of discharge, the patient tolerated ham and eggs for  breakfast and did not vomit.  She stated that she was ready for  discharge.  She had initially lost 16 pounds over a 2-week period  between her last clinic visits.  She had regained weight from 131 pounds  to 139 pounds at time of discharge.  She was otherwise stable and ready  for discharge.   DISCHARGE STATUS:  Stable.   DISCHARGE MEDICATIONS:  1. Phenergan 25 mg 1 tablet every 6 hours as needed for nausea and      vomiting.  2. Zofran 4 mg 1 tablet by mouth every 4 hours as needed for nausea      and vomiting.  3. Robinul 1 mg 1 tablet by mouth three times daily.  4. Prilosec 20 mg 1 tablet by mouth daily.  5. Novolin N 10 units subcutaneously in the morning before breakfast      and 6 units subcutaneously before bedtime.   DISCHARGE INSTRUCTIONS:  1. Discharged to home.  2. Continue bland diet as tolerated and may advance to full diet as      tolerated.  3. Keep well hydrated with small frequent sips of water.  4. Followup in [facility] in 1 week.  5. Hospital followup and routine OB appointment.      Karlton Lemon, MD  Electronically Signed     NS/MEDQ  D:  10/01/2007  T:  10/01/2007  Job:  [id number]

## 2010-08-20 ENCOUNTER — Emergency Department: Payer: Self-pay | Admitting: Emergency Medicine

## 2010-12-20 LAB — COMPREHENSIVE METABOLIC PANEL
AST: 20
Albumin: 3.4 — ABNORMAL LOW
Calcium: 9.7
Chloride: 99
Creatinine, Ser: 0.39 — ABNORMAL LOW
GFR calc Af Amer: >60
Total Protein: 6.8

## 2010-12-20 LAB — CBC
Hemoglobin: 10.6 — ABNORMAL LOW
MCHC: 34
MCV: 85.3
MCV: 85.7
Platelets: 204
RBC: 3.63 — ABNORMAL LOW
WBC: 10.4

## 2010-12-20 LAB — WET PREP, GENITAL: Yeast Wet Prep HPF POC: NONE SEEN

## 2010-12-20 LAB — BASIC METABOLIC PANEL
CO2: 24
Chloride: 102
GFR calc Af Amer: >60
Potassium: 3.3 — ABNORMAL LOW
Sodium: 133 — ABNORMAL LOW

## 2010-12-20 LAB — GC/CHLAMYDIA PROBE AMP, GENITAL: GC Probe Amp, Genital: NEGATIVE

## 2010-12-24 LAB — GLUCOSE, CAPILLARY
Glucose-Capillary: 123 — ABNORMAL HIGH
Glucose-Capillary: 130 — ABNORMAL HIGH
Glucose-Capillary: 154 — ABNORMAL HIGH
Glucose-Capillary: 174 — ABNORMAL HIGH
Glucose-Capillary: 62 — ABNORMAL LOW
Glucose-Capillary: 65 — ABNORMAL LOW
Glucose-Capillary: 68 — ABNORMAL LOW
Glucose-Capillary: 71
Glucose-Capillary: 75
Glucose-Capillary: 76
Glucose-Capillary: 76
Glucose-Capillary: 81
Glucose-Capillary: 82
Glucose-Capillary: 84
Glucose-Capillary: 94
Glucose-Capillary: 95
Glucose-Capillary: 104 — ABNORMAL HIGH
Glucose-Capillary: 104 — ABNORMAL HIGH
Glucose-Capillary: 106 — ABNORMAL HIGH
Glucose-Capillary: 109 — ABNORMAL HIGH
Glucose-Capillary: 110 — ABNORMAL HIGH
Glucose-Capillary: 110 — ABNORMAL HIGH
Glucose-Capillary: 113 — ABNORMAL HIGH
Glucose-Capillary: 117 — ABNORMAL HIGH
Glucose-Capillary: 118 — ABNORMAL HIGH
Glucose-Capillary: 56 — ABNORMAL LOW
Glucose-Capillary: 63 — ABNORMAL LOW
Glucose-Capillary: 66 — ABNORMAL LOW
Glucose-Capillary: 67 — ABNORMAL LOW
Glucose-Capillary: 70
Glucose-Capillary: 70
Glucose-Capillary: 72
Glucose-Capillary: 72
Glucose-Capillary: 72
Glucose-Capillary: 85
Glucose-Capillary: 86
Glucose-Capillary: 89
Glucose-Capillary: 90
Glucose-Capillary: 92
Glucose-Capillary: 92
Glucose-Capillary: 93
Glucose-Capillary: 93
Glucose-Capillary: 93

## 2010-12-24 LAB — CBC
HCT: 30.1 — ABNORMAL LOW
HCT: 30.5 — ABNORMAL LOW
HCT: 24.2 — ABNORMAL LOW
HCT: 26.8 — ABNORMAL LOW
HCT: 36.2
Hemoglobin: 10.1 — ABNORMAL LOW
Hemoglobin: 8.1 — ABNORMAL LOW
Hemoglobin: 9 — ABNORMAL LOW
MCHC: 33.1
MCHC: 33.3
MCHC: 33.7
MCV: 89
MCV: 87.5
MCV: 87.5
MCV: 87.6
Platelets: 271
Platelets: 200
Platelets: 206
RBC: 3.43 — ABNORMAL LOW
RBC: 3.07 — ABNORMAL LOW
RDW: 13.4
RDW: 13.2
RDW: 13.3
WBC: 5.7

## 2010-12-24 LAB — COMPREHENSIVE METABOLIC PANEL
ALT: 9
ALT: 9
ALT: 10
ALT: <8
AST: 10
AST: 9
AST: 11
AST: 17
Albumin: 2.3 — ABNORMAL LOW
Albumin: 2.4 — ABNORMAL LOW
Albumin: 2.9 — ABNORMAL LOW
Albumin: 1.9 — ABNORMAL LOW
Albumin: 2 — ABNORMAL LOW
Albumin: 2.1 — ABNORMAL LOW
Albumin: 3 — ABNORMAL LOW
Alkaline Phosphatase: 30 — ABNORMAL LOW
Alkaline Phosphatase: 40
Alkaline Phosphatase: 32 — ABNORMAL LOW
Alkaline Phosphatase: 36 — ABNORMAL LOW
Alkaline Phosphatase: 38 — ABNORMAL LOW
BUN: 1 — ABNORMAL LOW
BUN: 1 — ABNORMAL LOW
BUN: 1 — ABNORMAL LOW
BUN: 2 — ABNORMAL LOW
BUN: <1 — ABNORMAL LOW
BUN: <1 — ABNORMAL LOW
BUN: <1 — ABNORMAL LOW
BUN: <1 — ABNORMAL LOW
CO2: 19
CO2: 19
CO2: 22
CO2: 25
CO2: 26
Calcium: 8.8
Calcium: 9.1
Calcium: 8 — ABNORMAL LOW
Calcium: 8 — ABNORMAL LOW
Calcium: 8 — ABNORMAL LOW
Chloride: 110
Chloride: 110
Chloride: 102
Chloride: 104
Chloride: 106
Creatinine, Ser: 0.31 — ABNORMAL LOW
Creatinine, Ser: 0.4
Creatinine, Ser: 0.3 — ABNORMAL LOW
Creatinine, Ser: 0.3 — ABNORMAL LOW
Creatinine, Ser: 0.38 — ABNORMAL LOW
Creatinine, Ser: 0.39 — ABNORMAL LOW
Creatinine, Ser: <0.3 — ABNORMAL LOW
GFR calc Af Amer: >60
GFR calc Af Amer: >60
GFR calc Af Amer: >60
GFR calc non Af Amer: >60
GFR calc non Af Amer: >60
GFR calc non Af Amer: >60
GFR calc non Af Amer: >60
GFR calc non Af Amer: >60
Glucose, Bld: 157 — ABNORMAL HIGH
Glucose, Bld: 85
Glucose, Bld: 102 — ABNORMAL HIGH
Glucose, Bld: 66 — ABNORMAL LOW
Glucose, Bld: 85
Glucose, Bld: 96
Potassium: 3.2 — ABNORMAL LOW
Potassium: 3.4 — ABNORMAL LOW
Potassium: 3 — ABNORMAL LOW
Potassium: 3.2 — ABNORMAL LOW
Potassium: 3.4 — ABNORMAL LOW
Sodium: 135
Sodium: 137
Sodium: 132 — ABNORMAL LOW
Sodium: 135
Total Bilirubin: 0.6
Total Bilirubin: 1
Total Bilirubin: 0.9
Total Bilirubin: 1.1
Total Bilirubin: 1.2
Total Protein: 5.2 — ABNORMAL LOW
Total Protein: 5.7 — ABNORMAL LOW
Total Protein: 6.8
Total Protein: 4.7 — ABNORMAL LOW
Total Protein: 5.1 — ABNORMAL LOW

## 2010-12-24 LAB — BASIC METABOLIC PANEL
BUN: <1 — ABNORMAL LOW
BUN: <1 — ABNORMAL LOW
CO2: 18 — ABNORMAL LOW
CO2: 22
CO2: 27
CO2: 29
Calcium: 8.3 — ABNORMAL LOW
Calcium: 8.4
Calcium: 9
Chloride: 103
Chloride: 108
Creatinine, Ser: 0.33 — ABNORMAL LOW
Creatinine, Ser: 0.57
GFR calc Af Amer: >60
GFR calc Af Amer: >60
GFR calc Af Amer: >60
GFR calc Af Amer: >60
GFR calc non Af Amer: >60
GFR calc non Af Amer: >60
GFR calc non Af Amer: >60
GFR calc non Af Amer: >60
Glucose, Bld: 99
Glucose, Bld: 107 — ABNORMAL HIGH
Glucose, Bld: 111 — ABNORMAL HIGH
Glucose, Bld: 88
Potassium: 3.3 — ABNORMAL LOW
Potassium: 3 — ABNORMAL LOW
Potassium: 3.2 — ABNORMAL LOW
Potassium: 3.4 — ABNORMAL LOW
Sodium: 136
Sodium: 134 — ABNORMAL LOW
Sodium: 134 — ABNORMAL LOW
Sodium: 136
Sodium: 138

## 2010-12-24 LAB — DIFFERENTIAL
Basophils Relative: 1
Lymphocytes Relative: 32
Monocytes Absolute: 0.5
Monocytes Relative: 11
Neutro Abs: 2.9
Neutrophils Relative %: 56

## 2010-12-24 LAB — POCT URINALYSIS DIP (DEVICE)
Operator id: 297281
Protein, ur: 30 — AB
Specific Gravity, Urine: 1.015

## 2010-12-24 LAB — MAGNESIUM
Magnesium: 1.5
Magnesium: 1.4 — ABNORMAL LOW
Magnesium: 1.6
Magnesium: 1.7

## 2010-12-24 LAB — URINALYSIS, ROUTINE W REFLEX MICROSCOPIC
Glucose, UA: NEGATIVE
Hgb urine dipstick: NEGATIVE
Hgb urine dipstick: NEGATIVE
Ketones, ur: >80 — AB
Protein, ur: NEGATIVE
Protein, ur: NEGATIVE
Specific Gravity, Urine: 1.015
Urobilinogen, UA: 0.2

## 2010-12-24 LAB — LIPASE, BLOOD: Lipase: 23

## 2010-12-25 LAB — COMPREHENSIVE METABOLIC PANEL
ALT: 49 — ABNORMAL HIGH
Albumin: 2 — ABNORMAL LOW
BUN: 2 — ABNORMAL LOW
BUN: <1 — ABNORMAL LOW
CO2: 26
Calcium: 8.5
Calcium: 9.3
Chloride: 104
Creatinine, Ser: 0.31 — ABNORMAL LOW
Creatinine, Ser: 0.49
GFR calc Af Amer: >60
GFR calc non Af Amer: >60
Glucose, Bld: 111 — ABNORMAL HIGH
Total Bilirubin: 0.6
Total Protein: 5.3 — ABNORMAL LOW
Total Protein: 7

## 2010-12-25 LAB — URINALYSIS, ROUTINE W REFLEX MICROSCOPIC
Glucose, UA: 100 — AB
Ketones, ur: >80 — AB
Ketones, ur: NEGATIVE
Nitrite: NEGATIVE
Nitrite: POSITIVE — AB
Protein, ur: 100 — AB
Protein, ur: NEGATIVE
pH: 6.5

## 2010-12-25 LAB — WET PREP, GENITAL: Trich, Wet Prep: NONE SEEN

## 2010-12-25 LAB — CBC
Hemoglobin: 12.3
MCHC: 32.8
MCV: 86.7
RBC: 4.34
RDW: 12.8

## 2010-12-25 LAB — URINE CULTURE

## 2010-12-25 LAB — GLUCOSE, CAPILLARY
Glucose-Capillary: 105 — ABNORMAL HIGH
Glucose-Capillary: 110 — ABNORMAL HIGH
Glucose-Capillary: 110 — ABNORMAL HIGH
Glucose-Capillary: 112 — ABNORMAL HIGH
Glucose-Capillary: 115 — ABNORMAL HIGH
Glucose-Capillary: 116 — ABNORMAL HIGH
Glucose-Capillary: 116 — ABNORMAL HIGH
Glucose-Capillary: 119 — ABNORMAL HIGH
Glucose-Capillary: 56 — ABNORMAL LOW
Glucose-Capillary: 70
Glucose-Capillary: 71
Glucose-Capillary: 76
Glucose-Capillary: 80
Glucose-Capillary: 81
Glucose-Capillary: 91
Glucose-Capillary: 94

## 2010-12-25 LAB — URINE MICROSCOPIC-ADD ON

## 2010-12-25 LAB — BASIC METABOLIC PANEL
BUN: <1 — ABNORMAL LOW
Chloride: 102
GFR calc non Af Amer: >60
Glucose, Bld: 114 — ABNORMAL HIGH
Potassium: 2.7 — CL

## 2010-12-25 LAB — GC/CHLAMYDIA PROBE AMP, GENITAL: GC Probe Amp, Genital: NEGATIVE

## 2010-12-26 LAB — POCT URINALYSIS DIP (DEVICE)
Glucose, UA: 250 — AB
Hgb urine dipstick: NEGATIVE
Protein, ur: NEGATIVE
Specific Gravity, Urine: 1.02
Urobilinogen, UA: 4 — ABNORMAL HIGH

## 2011-03-26 ENCOUNTER — Inpatient Hospital Stay: Payer: Self-pay | Admitting: Internal Medicine

## 2011-03-26 LAB — COMPREHENSIVE METABOLIC PANEL
Albumin: 4.2 g/dL (ref 3.4–5.0)
Alkaline Phosphatase: 52 U/L (ref 50–136)
Anion Gap: 16 (ref 7–16)
BUN: 13 mg/dL (ref 7–18)
Bilirubin,Total: 0.3 mg/dL (ref 0.2–1.0)
Calcium, Total: 9.7 mg/dL (ref 8.5–10.1)
Chloride: 102 mmol/L (ref 98–107)
Co2: 23 mmol/L (ref 21–32)
Creatinine: 0.64 mg/dL (ref 0.60–1.30)
EGFR (African American): >60
EGFR (Non-African Amer.): >60
Glucose: 379 mg/dL — ABNORMAL HIGH (ref 65–99)
Osmolality: 297 (ref 275–301)
Potassium: 3.7 mmol/L (ref 3.5–5.1)
SGOT(AST): 5 U/L — ABNORMAL LOW (ref 15–37)
SGPT (ALT): 12 U/L
Sodium: 141 mmol/L (ref 136–145)
Total Protein: 8.2 g/dL (ref 6.4–8.2)

## 2011-03-26 LAB — URINALYSIS, COMPLETE
Bilirubin,UR: NEGATIVE
Glucose,UR: >=500 mg/dL (ref 0–75)
Leukocyte Esterase: NEGATIVE
Nitrite: POSITIVE
Ph: 6 (ref 4.5–8.0)
Protein: NEGATIVE
RBC,UR: 1 /HPF (ref 0–5)
Specific Gravity: 1.037 (ref 1.003–1.030)
Squamous Epithelial: 5
WBC UR: 3 /HPF (ref 0–5)

## 2011-03-26 LAB — CBC
HCT: 41 % (ref 35.0–47.0)
HGB: 13.5 g/dL (ref 12.0–16.0)
MCH: 28.8 pg (ref 26.0–34.0)
MCHC: 32.8 g/dL (ref 32.0–36.0)
MCV: 88 fL (ref 80–100)
Platelet: 188 10*3/uL (ref 150–440)
RBC: 4.68 10*6/uL (ref 3.80–5.20)
RDW: 13.1 % (ref 11.5–14.5)
WBC: 21.5 10*3/uL — ABNORMAL HIGH (ref 3.6–11.0)

## 2011-03-26 LAB — GLUCOSE, RANDOM: Glucose: 297 mg/dL — ABNORMAL HIGH (ref 65–99)

## 2011-03-26 LAB — PHENYTOIN LEVEL, TOTAL: Dilantin: <0.4 ug/mL — ABNORMAL LOW (ref 10.0–20.0)

## 2011-03-26 LAB — LIPASE, BLOOD: Lipase: 123 U/L (ref 73–393)

## 2011-03-26 LAB — PREGNANCY, URINE: Pregnancy Test, Urine: NEGATIVE m[IU]/mL

## 2011-03-27 LAB — BASIC METABOLIC PANEL
Anion Gap: 11 (ref 7–16)
BUN: 7 mg/dL (ref 7–18)
Calcium, Total: 7.9 mg/dL — ABNORMAL LOW (ref 8.5–10.1)
Chloride: 106 mmol/L (ref 98–107)
Co2: 26 mmol/L (ref 21–32)
Creatinine: 0.32 mg/dL — ABNORMAL LOW (ref 0.60–1.30)
EGFR (African American): >60
Osmolality: 286 (ref 275–301)
Potassium: 3 mmol/L — ABNORMAL LOW (ref 3.5–5.1)
Sodium: 143 mmol/L (ref 136–145)

## 2011-04-12 ENCOUNTER — Observation Stay: Payer: Self-pay | Admitting: Internal Medicine

## 2011-04-12 LAB — COMPREHENSIVE METABOLIC PANEL
Alkaline Phosphatase: 44 U/L — ABNORMAL LOW (ref 50–136)
Anion Gap: 13 (ref 7–16)
Bilirubin,Total: 0.3 mg/dL (ref 0.2–1.0)
Calcium, Total: 9.1 mg/dL (ref 8.5–10.1)
Chloride: 103 mmol/L (ref 98–107)
Co2: 23 mmol/L (ref 21–32)
Creatinine: 0.54 mg/dL — ABNORMAL LOW (ref 0.60–1.30)
EGFR (African American): >60
EGFR (Non-African Amer.): >60
Osmolality: 286 (ref 275–301)
Potassium: 3.5 mmol/L (ref 3.5–5.1)
Sodium: 139 mmol/L (ref 136–145)

## 2011-04-12 LAB — CBC
HCT: 39 % (ref 35.0–47.0)
HGB: 13.2 g/dL (ref 12.0–16.0)
MCH: 29.1 pg (ref 26.0–34.0)
MCHC: 33.9 g/dL (ref 32.0–36.0)
RBC: 4.54 10*6/uL (ref 3.80–5.20)
RDW: 12.5 % (ref 11.5–14.5)

## 2011-04-12 LAB — URINALYSIS, COMPLETE
Bacteria: NONE SEEN
Bilirubin,UR: NEGATIVE
Blood: NEGATIVE
Glucose,UR: >=500 mg/dL (ref 0–75)
Leukocyte Esterase: NEGATIVE
Ph: 7 (ref 4.5–8.0)
Protein: NEGATIVE
Squamous Epithelial: 1

## 2011-04-12 LAB — PHENYTOIN LEVEL, TOTAL: Dilantin: 27 ug/mL — ABNORMAL HIGH (ref 10.0–20.0)

## 2011-04-14 LAB — GLUCOSE, RANDOM: Glucose: 136 mg/dL — ABNORMAL HIGH (ref 65–99)

## 2011-04-14 LAB — HEMOGLOBIN A1C: Hemoglobin A1C: 11.3 % — ABNORMAL HIGH (ref 4.2–6.3)

## 2011-10-18 ENCOUNTER — Emergency Department: Payer: Self-pay

## 2011-10-18 LAB — URINALYSIS, COMPLETE
Bacteria: NONE SEEN
Leukocyte Esterase: NEGATIVE
Squamous Epithelial: 1
WBC UR: 1 /HPF (ref 0–5)

## 2011-10-18 LAB — COMPREHENSIVE METABOLIC PANEL
Albumin: 3.8 g/dL (ref 3.4–5.0)
Anion Gap: 6 — ABNORMAL LOW (ref 7–16)
Calcium, Total: 9.2 mg/dL (ref 8.5–10.1)
Co2: 27 mmol/L (ref 21–32)
EGFR (African American): >60
EGFR (Non-African Amer.): >60
Glucose: 310 mg/dL — ABNORMAL HIGH (ref 65–99)
SGOT(AST): 19 U/L (ref 15–37)
SGPT (ALT): 13 U/L
Sodium: 136 mmol/L (ref 136–145)

## 2011-10-18 LAB — CBC
HCT: 40.6 % (ref 35.0–47.0)
RDW: 12.4 % (ref 11.5–14.5)

## 2011-10-18 LAB — HEMOGLOBIN A1C: Hemoglobin A1C: 11.1 % — ABNORMAL HIGH (ref 4.2–6.3)

## 2011-11-13 ENCOUNTER — Emergency Department: Payer: Self-pay | Admitting: Emergency Medicine

## 2011-11-13 LAB — CBC WITH DIFFERENTIAL/PLATELET
Basophil #: 0.1 10*3/uL (ref 0.0–0.1)
Lymphocyte #: 3.5 10*3/uL (ref 1.0–3.6)
MCH: 30.4 pg (ref 26.0–34.0)
MCV: 87 fL (ref 80–100)
Monocyte #: 0.6 x10 3/mm (ref 0.2–0.9)
Monocyte %: 7.7 %
Platelet: 206 10*3/uL (ref 150–440)
RDW: 13 % (ref 11.5–14.5)
WBC: 8.1 10*3/uL (ref 3.6–11.0)

## 2011-11-13 LAB — URINALYSIS, COMPLETE
Blood: NEGATIVE
Glucose,UR: >=500 mg/dL (ref 0–75)
Nitrite: POSITIVE
Ph: 7 (ref 4.5–8.0)
Specific Gravity: 1.021 (ref 1.003–1.030)
Squamous Epithelial: 1

## 2011-11-13 LAB — BASIC METABOLIC PANEL
Anion Gap: 6 — ABNORMAL LOW (ref 7–16)
BUN: 9 mg/dL (ref 7–18)
Co2: 26 mmol/L (ref 21–32)
Creatinine: 0.35 mg/dL — ABNORMAL LOW (ref 0.60–1.30)
EGFR (African American): >60
Osmolality: 278 (ref 275–301)

## 2011-11-13 LAB — PHENYTOIN LEVEL, TOTAL: Dilantin: 17.1 ug/mL (ref 10.0–20.0)

## 2011-11-22 ENCOUNTER — Emergency Department: Payer: Self-pay | Admitting: Emergency Medicine

## 2011-11-22 LAB — CBC
HGB: 13.5 g/dL (ref 12.0–16.0)
MCH: 29.2 pg (ref 26.0–34.0)
MCHC: 34.1 g/dL (ref 32.0–36.0)
WBC: 11.6 10*3/uL — ABNORMAL HIGH (ref 3.6–11.0)

## 2011-11-22 LAB — COMPREHENSIVE METABOLIC PANEL
Bilirubin,Total: 0.1 mg/dL — ABNORMAL LOW (ref 0.2–1.0)
Chloride: 106 mmol/L (ref 98–107)
Co2: 25 mmol/L (ref 21–32)
Creatinine: 0.52 mg/dL — ABNORMAL LOW (ref 0.60–1.30)
EGFR (Non-African Amer.): >60
Osmolality: 287 (ref 275–301)
Sodium: 140 mmol/L (ref 136–145)
Total Protein: 7.9 g/dL (ref 6.4–8.2)

## 2011-11-22 LAB — URINALYSIS, COMPLETE
Bilirubin,UR: NEGATIVE
Nitrite: POSITIVE
Ph: 6 (ref 4.5–8.0)
Protein: 100
RBC,UR: 4 /HPF (ref 0–5)

## 2011-11-22 LAB — HCG, QUANTITATIVE, PREGNANCY: Beta Hcg, Quant.: <1 m[IU]/mL — ABNORMAL LOW

## 2011-11-22 LAB — LIPASE, BLOOD: Lipase: 155 U/L (ref 73–393)

## 2011-11-22 LAB — PHENYTOIN LEVEL, TOTAL: Dilantin: 7.6 ug/mL — ABNORMAL LOW (ref 10.0–20.0)

## 2012-01-13 ENCOUNTER — Emergency Department: Payer: Self-pay | Admitting: Emergency Medicine

## 2012-04-13 ENCOUNTER — Emergency Department: Payer: Self-pay | Admitting: Emergency Medicine

## 2012-04-13 LAB — URINALYSIS, COMPLETE
Bilirubin,UR: NEGATIVE
Glucose,UR: >=500 mg/dL (ref 0–75)
Nitrite: NEGATIVE
Ph: 6 (ref 4.5–8.0)
RBC,UR: NONE SEEN /HPF (ref 0–5)
Specific Gravity: 1.025 (ref 1.003–1.030)
WBC UR: 1880 /HPF (ref 0–5)

## 2012-04-13 LAB — COMPREHENSIVE METABOLIC PANEL
Albumin: 3.6 g/dL (ref 3.4–5.0)
Alkaline Phosphatase: 99 U/L (ref 50–136)
BUN: 7 mg/dL (ref 7–18)
Calcium, Total: 9.1 mg/dL (ref 8.5–10.1)
Chloride: 101 mmol/L (ref 98–107)
Co2: 29 mmol/L (ref 21–32)
Creatinine: 0.66 mg/dL (ref 0.60–1.30)
Glucose: 350 mg/dL — ABNORMAL HIGH (ref 65–99)
Osmolality: 284 (ref 275–301)
Potassium: 3.4 mmol/L — ABNORMAL LOW (ref 3.5–5.1)
SGOT(AST): 18 U/L (ref 15–37)
SGPT (ALT): 32 U/L (ref 12–78)
Sodium: 136 mmol/L (ref 136–145)

## 2012-04-13 LAB — CBC
MCH: 28.8 pg (ref 26.0–34.0)
MCHC: 33.5 g/dL (ref 32.0–36.0)
Platelet: 159 10*3/uL (ref 150–440)
RDW: 13.5 % (ref 11.5–14.5)

## 2012-04-13 LAB — PREGNANCY, URINE: Pregnancy Test, Urine: NEGATIVE m[IU]/mL

## 2012-04-14 ENCOUNTER — Inpatient Hospital Stay: Payer: Self-pay | Admitting: Internal Medicine

## 2012-04-14 LAB — CBC WITH DIFFERENTIAL/PLATELET
Basophil %: 0.4 %
Eosinophil #: 0 10*3/uL (ref 0.0–0.7)
HCT: 35.2 % (ref 35.0–47.0)
HGB: 12.1 g/dL (ref 12.0–16.0)
Lymphocyte #: 1 10*3/uL (ref 1.0–3.6)
Lymphocyte %: 9.3 %
MCHC: 34.4 g/dL (ref 32.0–36.0)
MCV: 85 fL (ref 80–100)
Monocyte #: 1 x10 3/mm — ABNORMAL HIGH (ref 0.2–0.9)
Platelet: 140 10*3/uL — ABNORMAL LOW (ref 150–440)
RDW: 13.1 % (ref 11.5–14.5)
WBC: 10.6 10*3/uL (ref 3.6–11.0)

## 2012-04-14 LAB — URINALYSIS, COMPLETE
Bacteria: NONE SEEN
Glucose,UR: >=500 mg/dL (ref 0–75)
Nitrite: NEGATIVE
Squamous Epithelial: 3
WBC UR: 33 /HPF (ref 0–5)

## 2012-04-14 LAB — BASIC METABOLIC PANEL
Anion Gap: 15 (ref 7–16)
BUN: 4 mg/dL — ABNORMAL LOW (ref 7–18)
Calcium, Total: 8.6 mg/dL (ref 8.5–10.1)
Creatinine: 0.52 mg/dL — ABNORMAL LOW (ref 0.60–1.30)
EGFR (Non-African Amer.): >60
Glucose: 255 mg/dL — ABNORMAL HIGH (ref 65–99)
Osmolality: 278 (ref 275–301)
Sodium: 136 mmol/L (ref 136–145)

## 2012-04-15 LAB — COMPREHENSIVE METABOLIC PANEL
Albumin: 2.6 g/dL — ABNORMAL LOW (ref 3.4–5.0)
Alkaline Phosphatase: 115 U/L (ref 50–136)
Anion Gap: 10 (ref 7–16)
BUN: 1 mg/dL — ABNORMAL LOW (ref 7–18)
Bilirubin,Total: 0.3 mg/dL (ref 0.2–1.0)
Calcium, Total: 8.1 mg/dL — ABNORMAL LOW (ref 8.5–10.1)
Chloride: 108 mmol/L — ABNORMAL HIGH (ref 98–107)
EGFR (Non-African Amer.): >60
Glucose: 191 mg/dL — ABNORMAL HIGH (ref 65–99)
SGOT(AST): 21 U/L (ref 15–37)
Sodium: 138 mmol/L (ref 136–145)
Total Protein: 6.6 g/dL (ref 6.4–8.2)

## 2012-04-15 LAB — CBC WITH DIFFERENTIAL/PLATELET
Basophil #: 0 10*3/uL (ref 0.0–0.1)
Basophil %: 0.3 %
Eosinophil %: 0.1 %
HCT: 31.7 % — ABNORMAL LOW (ref 35.0–47.0)
HGB: 10.8 g/dL — ABNORMAL LOW (ref 12.0–16.0)
Lymphocyte #: 1.4 10*3/uL (ref 1.0–3.6)
Lymphocyte %: 25.4 %
MCH: 29.1 pg (ref 26.0–34.0)
MCHC: 33.9 g/dL (ref 32.0–36.0)
Neutrophil #: 3.4 10*3/uL (ref 1.4–6.5)
Neutrophil %: 64.4 %
RBC: 3.7 10*6/uL — ABNORMAL LOW (ref 3.80–5.20)
WBC: 5.3 10*3/uL (ref 3.6–11.0)

## 2012-04-15 LAB — URINE CULTURE

## 2012-04-16 LAB — CBC WITH DIFFERENTIAL/PLATELET
Basophil #: 0 10*3/uL (ref 0.0–0.1)
Basophil %: 0.5 %
Eosinophil #: 0 10*3/uL (ref 0.0–0.7)
Eosinophil %: 0.4 %
Lymphocyte #: 1.7 10*3/uL (ref 1.0–3.6)
Lymphocyte %: 29.1 %
MCH: 27.2 pg (ref 26.0–34.0)
MCHC: 32.1 g/dL (ref 32.0–36.0)
Monocyte #: 0.6 x10 3/mm (ref 0.2–0.9)
Monocyte %: 9.7 %
Neutrophil #: 3.5 10*3/uL (ref 1.4–6.5)
Neutrophil %: 60.3 %
Platelet: 156 10*3/uL (ref 150–440)
RBC: 3.81 10*6/uL (ref 3.80–5.20)
RDW: 13.1 % (ref 11.5–14.5)

## 2012-04-16 LAB — URINE CULTURE

## 2012-04-17 ENCOUNTER — Emergency Department: Payer: Self-pay | Admitting: Emergency Medicine

## 2012-04-17 LAB — COMPREHENSIVE METABOLIC PANEL
Albumin: 2.8 g/dL — ABNORMAL LOW (ref 3.4–5.0)
Albumin: 2.8 g/dL — ABNORMAL LOW (ref 3.4–5.0)
Alkaline Phosphatase: 184 U/L — ABNORMAL HIGH (ref 50–136)
Alkaline Phosphatase: 161 U/L — ABNORMAL HIGH (ref 50–136)
BUN: 2 mg/dL — ABNORMAL LOW (ref 7–18)
Bilirubin,Total: 0.3 mg/dL (ref 0.2–1.0)
Bilirubin,Total: 0.3 mg/dL (ref 0.2–1.0)
Calcium, Total: 7.8 mg/dL — ABNORMAL LOW (ref 8.5–10.1)
Chloride: 110 mmol/L — ABNORMAL HIGH (ref 98–107)
Co2: 21 mmol/L (ref 21–32)
Co2: 19 mmol/L — ABNORMAL LOW (ref 21–32)
Creatinine: 0.33 mg/dL — ABNORMAL LOW (ref 0.60–1.30)
EGFR (African American): >60
EGFR (Non-African Amer.): >60
Glucose: 192 mg/dL — ABNORMAL HIGH (ref 65–99)
Glucose: 174 mg/dL — ABNORMAL HIGH (ref 65–99)
Potassium: 3.2 mmol/L — ABNORMAL LOW (ref 3.5–5.1)
SGOT(AST): 27 U/L (ref 15–37)
SGPT (ALT): 49 U/L (ref 12–78)
Sodium: 140 mmol/L (ref 136–145)
Total Protein: 7.2 g/dL (ref 6.4–8.2)
Total Protein: 7.2 g/dL (ref 6.4–8.2)

## 2012-04-17 LAB — CBC WITH DIFFERENTIAL/PLATELET
Basophil #: 0.1 10*3/uL (ref 0.0–0.1)
Eosinophil %: 0.5 %
Eosinophil %: 0.3 %
HCT: 33.9 % — ABNORMAL LOW (ref 35.0–47.0)
HCT: 33.2 % — ABNORMAL LOW (ref 35.0–47.0)
HGB: 11 g/dL — ABNORMAL LOW (ref 12.0–16.0)
Lymphocyte #: 1 10*3/uL (ref 1.0–3.6)
Lymphocyte %: 17.5 %
Lymphocyte %: 16.5 %
MCH: 28.3 pg (ref 26.0–34.0)
MCHC: 33.5 g/dL (ref 32.0–36.0)
MCHC: 33.3 g/dL (ref 32.0–36.0)
MCV: 85 fL (ref 80–100)
Monocyte %: 6.3 %
Monocyte %: 6.3 %
Neutrophil %: 74.9 %
Neutrophil %: 75.9 %
Platelet: 184 10*3/uL (ref 150–440)
RBC: 4.01 10*6/uL (ref 3.80–5.20)
RDW: 13.2 % (ref 11.5–14.5)
WBC: 7 10*3/uL (ref 3.6–11.0)

## 2012-04-17 LAB — URINALYSIS, COMPLETE
Bacteria: NONE SEEN
Bilirubin,UR: NEGATIVE
Glucose,UR: >=500 mg/dL (ref 0–75)
Nitrite: NEGATIVE
Ph: 6 (ref 4.5–8.0)
Ph: 6 (ref 4.5–8.0)
Protein: NEGATIVE
Protein: NEGATIVE
RBC,UR: 26 /HPF (ref 0–5)
Specific Gravity: 1.008 (ref 1.003–1.030)
Squamous Epithelial: <1
WBC UR: 2 /HPF (ref 0–5)
WBC UR: 1 /HPF (ref 0–5)

## 2012-04-18 ENCOUNTER — Inpatient Hospital Stay: Payer: Self-pay | Admitting: Internal Medicine

## 2012-04-18 LAB — BASIC METABOLIC PANEL
Anion Gap: 11 (ref 7–16)
BUN: 1 mg/dL — ABNORMAL LOW (ref 7–18)
Calcium, Total: 7.9 mg/dL — ABNORMAL LOW (ref 8.5–10.1)
Co2: 21 mmol/L (ref 21–32)
Creatinine: 0.48 mg/dL — ABNORMAL LOW (ref 0.60–1.30)
EGFR (African American): >60
Potassium: 3.2 mmol/L — ABNORMAL LOW (ref 3.5–5.1)

## 2012-04-19 LAB — POTASSIUM: Potassium: 3.4 mmol/L — ABNORMAL LOW (ref 3.5–5.1)

## 2012-04-19 LAB — CULTURE, BLOOD (SINGLE)

## 2012-04-21 LAB — PATHOLOGY REPORT

## 2012-04-21 LAB — MAGNESIUM: Magnesium: 1.7 mg/dL — ABNORMAL LOW

## 2012-09-01 ENCOUNTER — Observation Stay: Payer: Self-pay | Admitting: Student

## 2012-09-01 LAB — URINALYSIS, COMPLETE
Bacteria: NONE SEEN
Bilirubin,UR: NEGATIVE
Nitrite: NEGATIVE
Protein: 30
RBC,UR: 18 /HPF (ref 0–5)
Squamous Epithelial: 6

## 2012-09-01 LAB — CBC
HGB: 13.1 g/dL (ref 12.0–16.0)
MCH: 27.4 pg (ref 26.0–34.0)
MCHC: 32.3 g/dL (ref 32.0–36.0)
MCV: 85 fL (ref 80–100)
Platelet: 249 10*3/uL (ref 150–440)
RDW: 12.6 % (ref 11.5–14.5)
WBC: 21.6 10*3/uL — ABNORMAL HIGH (ref 3.6–11.0)

## 2012-09-01 LAB — LIPASE, BLOOD: Lipase: 96 U/L (ref 73–393)

## 2012-09-01 LAB — COMPREHENSIVE METABOLIC PANEL
Bilirubin,Total: 0.3 mg/dL (ref 0.2–1.0)
Calcium, Total: 9.3 mg/dL (ref 8.5–10.1)
Chloride: 108 mmol/L — ABNORMAL HIGH (ref 98–107)
Co2: 19 mmol/L — ABNORMAL LOW (ref 21–32)
Creatinine: 0.63 mg/dL (ref 0.60–1.30)
EGFR (Non-African Amer.): >60
Osmolality: 286 (ref 275–301)
Potassium: 3.8 mmol/L (ref 3.5–5.1)
SGOT(AST): 9 U/L — ABNORMAL LOW (ref 15–37)
SGPT (ALT): 11 U/L — ABNORMAL LOW (ref 12–78)
Sodium: 139 mmol/L (ref 136–145)
Total Protein: 8.2 g/dL (ref 6.4–8.2)

## 2012-09-01 LAB — PREGNANCY, URINE: Pregnancy Test, Urine: NEGATIVE m[IU]/mL

## 2012-09-02 LAB — CBC WITH DIFFERENTIAL/PLATELET
Basophil %: 1.7 %
HGB: 11.8 g/dL — ABNORMAL LOW (ref 12.0–16.0)
Lymphocyte #: 4.1 10*3/uL — ABNORMAL HIGH (ref 1.0–3.6)
Lymphocyte %: 35.7 %
MCH: 28 pg (ref 26.0–34.0)
MCHC: 33.6 g/dL (ref 32.0–36.0)
Monocyte #: 0.6 x10 3/mm (ref 0.2–0.9)
Monocyte %: 4.8 %
Neutrophil #: 6.5 10*3/uL (ref 1.4–6.5)
Platelet: 216 10*3/uL (ref 150–440)
RBC: 4.21 10*6/uL (ref 3.80–5.20)
RDW: 12.8 % (ref 11.5–14.5)
WBC: 11.5 10*3/uL — ABNORMAL HIGH (ref 3.6–11.0)

## 2012-09-02 LAB — BASIC METABOLIC PANEL
BUN: 8 mg/dL (ref 7–18)
Co2: 24 mmol/L (ref 21–32)
Creatinine: 0.51 mg/dL — ABNORMAL LOW (ref 0.60–1.30)
EGFR (African American): >60
EGFR (Non-African Amer.): >60
Potassium: 3.2 mmol/L — ABNORMAL LOW (ref 3.5–5.1)
Sodium: 139 mmol/L (ref 136–145)

## 2012-09-04 LAB — URINE CULTURE

## 2013-02-27 LAB — CBC WITH DIFFERENTIAL/PLATELET
Basophil #: 0.1 10*3/uL (ref 0.0–0.1)
Basophil %: 1.2 %
Eosinophil #: 0.1 10*3/uL (ref 0.0–0.7)
HCT: 42.4 % (ref 35.0–47.0)
HGB: 14.1 g/dL (ref 12.0–16.0)
Lymphocyte #: 2.9 10*3/uL (ref 1.0–3.6)
MCHC: 33.2 g/dL (ref 32.0–36.0)
Monocyte #: 0.4 x10 3/mm (ref 0.2–0.9)
Monocyte %: 4.7 %
Neutrophil %: 62.3 %
RBC: 4.98 10*6/uL (ref 3.80–5.20)
WBC: 9.3 10*3/uL (ref 3.6–11.0)

## 2013-02-27 LAB — URINALYSIS, COMPLETE
Bacteria: NONE SEEN
Bilirubin,UR: NEGATIVE
Glucose,UR: >=500 mg/dL (ref 0–75)
Leukocyte Esterase: NEGATIVE
Nitrite: NEGATIVE
Protein: NEGATIVE
Specific Gravity: 1.039 (ref 1.003–1.030)
WBC UR: 2 /HPF (ref 0–5)

## 2013-02-27 LAB — COMPREHENSIVE METABOLIC PANEL
Albumin: 3.8 g/dL (ref 3.4–5.0)
Alkaline Phosphatase: 67 U/L
Anion Gap: 6 — ABNORMAL LOW (ref 7–16)
BUN: 10 mg/dL (ref 7–18)
Bilirubin,Total: 0.3 mg/dL (ref 0.2–1.0)
Calcium, Total: 9.6 mg/dL (ref 8.5–10.1)
Chloride: 102 mmol/L (ref 98–107)
Co2: 28 mmol/L (ref 21–32)
Creatinine: 0.92 mg/dL (ref 0.60–1.30)
EGFR (African American): >60
Glucose: 347 mg/dL — ABNORMAL HIGH (ref 65–99)
Osmolality: 285 (ref 275–301)
Potassium: 3.5 mmol/L (ref 3.5–5.1)
SGOT(AST): 15 U/L (ref 15–37)
Sodium: 136 mmol/L (ref 136–145)

## 2013-02-27 LAB — LIPASE, BLOOD: Lipase: 464 U/L — ABNORMAL HIGH (ref 73–393)

## 2013-02-28 ENCOUNTER — Inpatient Hospital Stay: Payer: Self-pay | Admitting: Student

## 2013-03-01 LAB — CBC WITH DIFFERENTIAL/PLATELET
Basophil #: 0 10*3/uL (ref 0.0–0.1)
Basophil %: 0.3 %
Eosinophil #: 0.1 10*3/uL (ref 0.0–0.7)
Eosinophil %: 0.8 %
Lymphocyte #: 2.9 10*3/uL (ref 1.0–3.6)
MCH: 28.5 pg (ref 26.0–34.0)
MCHC: 33.9 g/dL (ref 32.0–36.0)
Platelet: 181 10*3/uL (ref 150–440)
RDW: 12.8 % (ref 11.5–14.5)
WBC: 8.3 10*3/uL (ref 3.6–11.0)

## 2013-03-01 LAB — BASIC METABOLIC PANEL
Anion Gap: 9 (ref 7–16)
Chloride: 106 mmol/L (ref 98–107)
Glucose: 151 mg/dL — ABNORMAL HIGH (ref 65–99)
Potassium: 3 mmol/L — ABNORMAL LOW (ref 3.5–5.1)

## 2013-03-02 LAB — CBC WITH DIFFERENTIAL/PLATELET
Basophil #: 0.1 10*3/uL (ref 0.0–0.1)
Basophil %: 1 %
HCT: 35.5 % (ref 35.0–47.0)
Lymphocyte #: 3.2 10*3/uL (ref 1.0–3.6)
Lymphocyte %: 39.5 %
MCH: 28.1 pg (ref 26.0–34.0)
MCHC: 33.5 g/dL (ref 32.0–36.0)
MCV: 84 fL (ref 80–100)
Monocyte %: 6.5 %
RBC: 4.23 10*6/uL (ref 3.80–5.20)

## 2013-03-02 LAB — MAGNESIUM: Magnesium: 1.4 mg/dL — ABNORMAL LOW

## 2013-03-02 LAB — COMPREHENSIVE METABOLIC PANEL
Alkaline Phosphatase: 53 U/L
Anion Gap: 6 — ABNORMAL LOW (ref 7–16)
BUN: 3 mg/dL — ABNORMAL LOW (ref 7–18)
Calcium, Total: 8.6 mg/dL (ref 8.5–10.1)
Chloride: 107 mmol/L (ref 98–107)
Co2: 26 mmol/L (ref 21–32)
Creatinine: 0.43 mg/dL — ABNORMAL LOW (ref 0.60–1.30)
EGFR (Non-African Amer.): >60
SGOT(AST): 13 U/L — ABNORMAL LOW (ref 15–37)
SGPT (ALT): 14 U/L (ref 12–78)

## 2013-03-03 LAB — BASIC METABOLIC PANEL
Anion Gap: 3 — ABNORMAL LOW (ref 7–16)
Calcium, Total: 8.9 mg/dL (ref 8.5–10.1)
Chloride: 107 mmol/L (ref 98–107)
Co2: 27 mmol/L (ref 21–32)
Creatinine: 0.4 mg/dL — ABNORMAL LOW (ref 0.60–1.30)
EGFR (African American): >60
EGFR (Non-African Amer.): >60
Glucose: 90 mg/dL (ref 65–99)
Potassium: 3.4 mmol/L — ABNORMAL LOW (ref 3.5–5.1)
Sodium: 137 mmol/L (ref 136–145)

## 2013-03-03 LAB — MAGNESIUM: Magnesium: 1.6 mg/dL — ABNORMAL LOW

## 2013-03-26 ENCOUNTER — Emergency Department: Payer: Self-pay | Admitting: Emergency Medicine

## 2013-03-26 LAB — COMPREHENSIVE METABOLIC PANEL
ALT: 14 U/L (ref 12–78)
Albumin: 4 g/dL (ref 3.4–5.0)
Alkaline Phosphatase: 71 U/L
Anion Gap: 8 (ref 7–16)
BILIRUBIN TOTAL: 0.3 mg/dL (ref 0.2–1.0)
BUN: 7 mg/dL (ref 7–18)
CALCIUM: 9.5 mg/dL (ref 8.5–10.1)
CHLORIDE: 102 mmol/L (ref 98–107)
Co2: 24 mmol/L (ref 21–32)
Creatinine: 0.6 mg/dL (ref 0.60–1.30)
EGFR (Non-African Amer.): >60
Glucose: 358 mg/dL — ABNORMAL HIGH (ref 65–99)
Osmolality: 281 (ref 275–301)
Potassium: 3.6 mmol/L (ref 3.5–5.1)
SGOT(AST): 16 U/L (ref 15–37)
Sodium: 134 mmol/L — ABNORMAL LOW (ref 136–145)
TOTAL PROTEIN: 8.1 g/dL (ref 6.4–8.2)

## 2013-03-26 LAB — CBC
HCT: 41.6 % (ref 35.0–47.0)
HGB: 13.7 g/dL (ref 12.0–16.0)
MCH: 27.9 pg (ref 26.0–34.0)
MCHC: 33 g/dL (ref 32.0–36.0)
MCV: 85 fL (ref 80–100)
PLATELETS: 193 10*3/uL (ref 150–440)
RBC: 4.92 10*6/uL (ref 3.80–5.20)
RDW: 13.1 % (ref 11.5–14.5)
WBC: 11.3 10*3/uL — ABNORMAL HIGH (ref 3.6–11.0)

## 2013-03-26 LAB — URINALYSIS, COMPLETE
Bacteria: NONE SEEN
Bilirubin,UR: NEGATIVE
Glucose,UR: >=500 mg/dL (ref 0–75)
LEUKOCYTE ESTERASE: NEGATIVE
NITRITE: NEGATIVE
Ph: 6 (ref 4.5–8.0)
Protein: NEGATIVE
RBC,UR: 2 /HPF (ref 0–5)
Specific Gravity: 1.038 (ref 1.003–1.030)
Squamous Epithelial: 1

## 2013-03-26 LAB — PREGNANCY, URINE: PREGNANCY TEST, URINE: NEGATIVE m[IU]/mL

## 2013-03-26 LAB — LIPASE, BLOOD: LIPASE: 744 U/L — AB (ref 73–393)

## 2013-05-20 ENCOUNTER — Emergency Department: Payer: Self-pay | Admitting: Emergency Medicine

## 2013-05-20 LAB — COMPREHENSIVE METABOLIC PANEL
ALBUMIN: 4.1 g/dL (ref 3.4–5.0)
ALK PHOS: 67 U/L
ALT: 17 U/L (ref 12–78)
ANION GAP: 7 (ref 7–16)
BUN: 7 mg/dL (ref 7–18)
Bilirubin,Total: 0.4 mg/dL (ref 0.2–1.0)
CHLORIDE: 104 mmol/L (ref 98–107)
Calcium, Total: 9.4 mg/dL (ref 8.5–10.1)
Co2: 23 mmol/L (ref 21–32)
Creatinine: 0.45 mg/dL — ABNORMAL LOW (ref 0.60–1.30)
EGFR (Non-African Amer.): >60
Glucose: 300 mg/dL — ABNORMAL HIGH (ref 65–99)
OSMOLALITY: 277 (ref 275–301)
POTASSIUM: 4.1 mmol/L (ref 3.5–5.1)
SGOT(AST): 29 U/L (ref 15–37)
SODIUM: 134 mmol/L — AB (ref 136–145)
TOTAL PROTEIN: 8.9 g/dL — AB (ref 6.4–8.2)

## 2013-05-20 LAB — CBC
HCT: 43.2 % (ref 35.0–47.0)
HGB: 14.4 g/dL (ref 12.0–16.0)
MCH: 28.7 pg (ref 26.0–34.0)
MCHC: 33.5 g/dL (ref 32.0–36.0)
MCV: 86 fL (ref 80–100)
Platelet: 194 10*3/uL (ref 150–440)
RBC: 5.03 10*6/uL (ref 3.80–5.20)
RDW: 13 % (ref 11.5–14.5)
WBC: 10.8 10*3/uL (ref 3.6–11.0)

## 2013-05-20 LAB — MAGNESIUM: MAGNESIUM: 1.6 mg/dL — AB

## 2013-05-20 LAB — LIPASE, BLOOD: Lipase: 174 U/L (ref 73–393)

## 2013-05-20 LAB — HCG, QUANTITATIVE, PREGNANCY

## 2013-05-27 ENCOUNTER — Emergency Department: Payer: Self-pay | Admitting: Emergency Medicine

## 2013-05-27 LAB — CBC
HCT: 44.7 % (ref 35.0–47.0)
HGB: 15 g/dL (ref 12.0–16.0)
MCH: 28.6 pg (ref 26.0–34.0)
MCHC: 33.6 g/dL (ref 32.0–36.0)
MCV: 85 fL (ref 80–100)
Platelet: 209 10*3/uL (ref 150–440)
RBC: 5.24 10*6/uL — AB (ref 3.80–5.20)
RDW: 13.1 % (ref 11.5–14.5)
WBC: 9 10*3/uL (ref 3.6–11.0)

## 2013-05-27 LAB — URINALYSIS, COMPLETE
BILIRUBIN, UR: NEGATIVE
Blood: NEGATIVE
Glucose,UR: >=500 mg/dL (ref 0–75)
Ketone: NEGATIVE
Leukocyte Esterase: NEGATIVE
NITRITE: NEGATIVE
Ph: 6 (ref 4.5–8.0)
Protein: 30
RBC,UR: 2 /HPF (ref 0–5)
Specific Gravity: 1.038 (ref 1.003–1.030)
Squamous Epithelial: 12
WBC UR: 8 /HPF (ref 0–5)

## 2013-05-27 LAB — COMPREHENSIVE METABOLIC PANEL
ALBUMIN: 3.9 g/dL (ref 3.4–5.0)
Alkaline Phosphatase: 68 U/L
Anion Gap: 5 — ABNORMAL LOW (ref 7–16)
BUN: 7 mg/dL (ref 7–18)
Bilirubin,Total: 0.2 mg/dL (ref 0.2–1.0)
Calcium, Total: 9.7 mg/dL (ref 8.5–10.1)
Chloride: 103 mmol/L (ref 98–107)
Co2: 27 mmol/L (ref 21–32)
Creatinine: 0.65 mg/dL (ref 0.60–1.30)
EGFR (African American): >60
EGFR (Non-African Amer.): >60
GLUCOSE: 368 mg/dL — AB (ref 65–99)
Osmolality: 283 (ref 275–301)
Potassium: 3.8 mmol/L (ref 3.5–5.1)
SGOT(AST): 6 U/L — ABNORMAL LOW (ref 15–37)
SGPT (ALT): 10 U/L — ABNORMAL LOW (ref 12–78)
Sodium: 135 mmol/L — ABNORMAL LOW (ref 136–145)
Total Protein: 8.2 g/dL (ref 6.4–8.2)

## 2013-05-27 LAB — LIPASE, BLOOD: Lipase: 252 U/L (ref 73–393)

## 2013-05-27 LAB — ACETAMINOPHEN LEVEL: Acetaminophen: <2 ug/mL

## 2013-05-27 LAB — MAGNESIUM: Magnesium: 1.6 mg/dL — ABNORMAL LOW

## 2013-05-28 LAB — BASIC METABOLIC PANEL
Anion Gap: 8 (ref 7–16)
BUN: 8 mg/dL (ref 7–18)
CO2: 24 mmol/L (ref 21–32)
CREATININE: 0.68 mg/dL (ref 0.60–1.30)
Calcium, Total: 8.9 mg/dL (ref 8.5–10.1)
Chloride: 106 mmol/L (ref 98–107)
EGFR (African American): >60
EGFR (Non-African Amer.): >60
GLUCOSE: 227 mg/dL — AB (ref 65–99)
Osmolality: 281 (ref 275–301)
POTASSIUM: 3.7 mmol/L (ref 3.5–5.1)
Sodium: 138 mmol/L (ref 136–145)

## 2013-05-28 LAB — CBC WITH DIFFERENTIAL/PLATELET
BASOS PCT: 0.3 %
Basophil #: 0 10*3/uL (ref 0.0–0.1)
EOS ABS: 0 10*3/uL (ref 0.0–0.7)
Eosinophil %: 0.3 %
HCT: 42 % (ref 35.0–47.0)
HGB: 13.7 g/dL (ref 12.0–16.0)
LYMPHS ABS: 1.7 10*3/uL (ref 1.0–3.6)
LYMPHS PCT: 10.4 %
MCH: 28.2 pg (ref 26.0–34.0)
MCHC: 32.6 g/dL (ref 32.0–36.0)
MCV: 87 fL (ref 80–100)
Monocyte #: 0.7 x10 3/mm (ref 0.2–0.9)
Monocyte %: 4.3 %
Neutrophil #: 13.8 10*3/uL — ABNORMAL HIGH (ref 1.4–6.5)
Neutrophil %: 84.7 %
PLATELETS: 195 10*3/uL (ref 150–440)
RBC: 4.86 10*6/uL (ref 3.80–5.20)
RDW: 13.2 % (ref 11.5–14.5)
WBC: 16.3 10*3/uL — ABNORMAL HIGH (ref 3.6–11.0)

## 2013-05-28 LAB — MAGNESIUM: MAGNESIUM: 1.7 mg/dL — AB

## 2013-05-29 ENCOUNTER — Inpatient Hospital Stay: Payer: Self-pay | Admitting: Internal Medicine

## 2013-05-29 LAB — BASIC METABOLIC PANEL
Anion Gap: 8 (ref 7–16)
BUN: 7 mg/dL (ref 7–18)
CALCIUM: 8.5 mg/dL (ref 8.5–10.1)
CHLORIDE: 106 mmol/L (ref 98–107)
Co2: 23 mmol/L (ref 21–32)
Creatinine: 0.55 mg/dL — ABNORMAL LOW (ref 0.60–1.30)
EGFR (Non-African Amer.): >60
Glucose: 166 mg/dL — ABNORMAL HIGH (ref 65–99)
OSMOLALITY: 276 (ref 275–301)
Potassium: 2.9 mmol/L — ABNORMAL LOW (ref 3.5–5.1)
Sodium: 137 mmol/L (ref 136–145)

## 2013-05-29 LAB — CBC WITH DIFFERENTIAL/PLATELET
Basophil #: 0.2 10*3/uL — ABNORMAL HIGH (ref 0.0–0.1)
Basophil %: 1.6 %
Eosinophil #: 0 10*3/uL (ref 0.0–0.7)
Eosinophil %: 0.1 %
HCT: 38.7 % (ref 35.0–47.0)
HGB: 12.8 g/dL (ref 12.0–16.0)
Lymphocyte #: 3.7 10*3/uL — ABNORMAL HIGH (ref 1.0–3.6)
Lymphocyte %: 26.7 %
MCH: 28.1 pg (ref 26.0–34.0)
MCHC: 33.2 g/dL (ref 32.0–36.0)
MCV: 85 fL (ref 80–100)
Monocyte #: 0.8 x10 3/mm (ref 0.2–0.9)
Monocyte %: 5.7 %
NEUTROS ABS: 9.2 10*3/uL — AB (ref 1.4–6.5)
NEUTROS PCT: 65.9 %
PLATELETS: 202 10*3/uL (ref 150–440)
RBC: 4.57 10*6/uL (ref 3.80–5.20)
RDW: 13.2 % (ref 11.5–14.5)
WBC: 13.9 10*3/uL — AB (ref 3.6–11.0)

## 2013-05-29 LAB — MAGNESIUM: Magnesium: 1.5 mg/dL — ABNORMAL LOW

## 2013-05-30 LAB — BASIC METABOLIC PANEL
Anion Gap: 7 (ref 7–16)
BUN: 5 mg/dL — ABNORMAL LOW (ref 7–18)
CHLORIDE: 109 mmol/L — AB (ref 98–107)
CREATININE: 0.45 mg/dL — AB (ref 0.60–1.30)
Calcium, Total: 8.2 mg/dL — ABNORMAL LOW (ref 8.5–10.1)
Co2: 24 mmol/L (ref 21–32)
EGFR (African American): >60
EGFR (Non-African Amer.): >60
GLUCOSE: 97 mg/dL (ref 65–99)
OSMOLALITY: 277 (ref 275–301)
POTASSIUM: 3.2 mmol/L — AB (ref 3.5–5.1)
Sodium: 140 mmol/L (ref 136–145)

## 2013-05-30 LAB — MAGNESIUM: MAGNESIUM: 1.5 mg/dL — AB

## 2013-06-28 ENCOUNTER — Inpatient Hospital Stay: Payer: Self-pay | Admitting: Internal Medicine

## 2013-06-28 LAB — WET PREP, GENITAL

## 2013-06-28 LAB — COMPREHENSIVE METABOLIC PANEL
AST: 7 U/L — AB (ref 15–37)
Albumin: 3.2 g/dL — ABNORMAL LOW (ref 3.4–5.0)
Alkaline Phosphatase: 55 U/L
Anion Gap: 6 — ABNORMAL LOW (ref 7–16)
BILIRUBIN TOTAL: 0.2 mg/dL (ref 0.2–1.0)
BUN: 6 mg/dL — ABNORMAL LOW (ref 7–18)
CHLORIDE: 108 mmol/L — AB (ref 98–107)
CREATININE: 0.61 mg/dL (ref 0.60–1.30)
Calcium, Total: 7.9 mg/dL — ABNORMAL LOW (ref 8.5–10.1)
Co2: 26 mmol/L (ref 21–32)
EGFR (African American): >60
EGFR (Non-African Amer.): >60
Glucose: 264 mg/dL — ABNORMAL HIGH (ref 65–99)
OSMOLALITY: 286 (ref 275–301)
POTASSIUM: 3 mmol/L — AB (ref 3.5–5.1)
SGPT (ALT): 10 U/L — ABNORMAL LOW (ref 12–78)
Sodium: 140 mmol/L (ref 136–145)
TOTAL PROTEIN: 6.8 g/dL (ref 6.4–8.2)

## 2013-06-28 LAB — CBC
HCT: 39.6 % (ref 35.0–47.0)
HGB: 13 g/dL (ref 12.0–16.0)
MCH: 28.2 pg (ref 26.0–34.0)
MCHC: 32.9 g/dL (ref 32.0–36.0)
MCV: 86 fL (ref 80–100)
Platelet: 167 10*3/uL (ref 150–440)
RBC: 4.62 10*6/uL (ref 3.80–5.20)
RDW: 13.4 % (ref 11.5–14.5)
WBC: 8.8 10*3/uL (ref 3.6–11.0)

## 2013-06-28 LAB — ETHANOL: Ethanol: <3 mg/dL

## 2013-06-28 LAB — HCG, QUANTITATIVE, PREGNANCY

## 2013-06-28 LAB — LIPASE, BLOOD: LIPASE: 150 U/L (ref 73–393)

## 2013-06-28 LAB — MAGNESIUM: MAGNESIUM: 1.1 mg/dL — AB

## 2013-06-28 LAB — GC/CHLAMYDIA PROBE AMP

## 2013-06-29 ENCOUNTER — Ambulatory Visit: Payer: Self-pay | Admitting: Neurology

## 2013-06-29 LAB — CBC WITH DIFFERENTIAL/PLATELET
BASOS ABS: 0.1 10*3/uL (ref 0.0–0.1)
Basophil %: 0.7 %
Eosinophil #: 0.1 10*3/uL (ref 0.0–0.7)
Eosinophil %: 0.9 %
HCT: 36.8 % (ref 35.0–47.0)
HGB: 12.2 g/dL (ref 12.0–16.0)
LYMPHS PCT: 34.9 %
Lymphocyte #: 3.4 10*3/uL (ref 1.0–3.6)
MCH: 28.5 pg (ref 26.0–34.0)
MCHC: 33 g/dL (ref 32.0–36.0)
MCV: 86 fL (ref 80–100)
Monocyte #: 0.5 x10 3/mm (ref 0.2–0.9)
Monocyte %: 5.1 %
Neutrophil #: 5.7 10*3/uL (ref 1.4–6.5)
Neutrophil %: 58.4 %
PLATELETS: 144 10*3/uL — AB (ref 150–440)
RBC: 4.27 10*6/uL (ref 3.80–5.20)
RDW: 13.7 % (ref 11.5–14.5)
WBC: 9.8 10*3/uL (ref 3.6–11.0)

## 2013-06-29 LAB — DRUG SCREEN, URINE
AMPHETAMINES, UR SCREEN: NEGATIVE (ref ?–1000)
Barbiturates, Ur Screen: NEGATIVE (ref ?–200)
Benzodiazepine, Ur Scrn: NEGATIVE (ref ?–200)
CANNABINOID 50 NG, UR ~~LOC~~: POSITIVE (ref ?–50)
COCAINE METABOLITE, UR ~~LOC~~: NEGATIVE (ref ?–300)
MDMA (Ecstasy)Ur Screen: NEGATIVE (ref ?–500)
Methadone, Ur Screen: NEGATIVE (ref ?–300)
Opiate, Ur Screen: POSITIVE (ref ?–300)
Phencyclidine (PCP) Ur S: NEGATIVE (ref ?–25)
TRICYCLIC, UR SCREEN: NEGATIVE (ref ?–1000)

## 2013-06-29 LAB — URINALYSIS, COMPLETE
Bacteria: NONE SEEN
Bilirubin,UR: NEGATIVE
Glucose,UR: >=500 mg/dL (ref 0–75)
KETONE: NEGATIVE
Leukocyte Esterase: NEGATIVE
NITRITE: NEGATIVE
Ph: 7 (ref 4.5–8.0)
Protein: 30
RBC,UR: 3372 /HPF (ref 0–5)
Specific Gravity: 1.044 (ref 1.003–1.030)
Squamous Epithelial: 3
WBC UR: 5 /HPF (ref 0–5)

## 2013-06-29 LAB — BASIC METABOLIC PANEL
Anion Gap: 6 — ABNORMAL LOW (ref 7–16)
BUN: 4 mg/dL — AB (ref 7–18)
CREATININE: 0.64 mg/dL (ref 0.60–1.30)
Calcium, Total: 7.6 mg/dL — ABNORMAL LOW (ref 8.5–10.1)
Chloride: 106 mmol/L (ref 98–107)
Co2: 25 mmol/L (ref 21–32)
EGFR (African American): >60
EGFR (Non-African Amer.): >60
Glucose: 287 mg/dL — ABNORMAL HIGH (ref 65–99)
Osmolality: 281 (ref 275–301)
POTASSIUM: 3.8 mmol/L (ref 3.5–5.1)
Sodium: 137 mmol/L (ref 136–145)

## 2013-06-29 LAB — MAGNESIUM: MAGNESIUM: 2.5 mg/dL — AB

## 2013-06-30 ENCOUNTER — Emergency Department: Payer: Self-pay | Admitting: Emergency Medicine

## 2013-06-30 LAB — COMPREHENSIVE METABOLIC PANEL
ALT: 11 U/L — AB (ref 12–78)
Albumin: 3.8 g/dL (ref 3.4–5.0)
Alkaline Phosphatase: 63 U/L
Anion Gap: 5 — ABNORMAL LOW (ref 7–16)
BUN: 4 mg/dL — ABNORMAL LOW (ref 7–18)
Bilirubin,Total: 0.4 mg/dL (ref 0.2–1.0)
CHLORIDE: 104 mmol/L (ref 98–107)
CO2: 27 mmol/L (ref 21–32)
Calcium, Total: 9.1 mg/dL (ref 8.5–10.1)
Creatinine: 0.58 mg/dL — ABNORMAL LOW (ref 0.60–1.30)
EGFR (African American): >60
EGFR (Non-African Amer.): >60
Glucose: 240 mg/dL — ABNORMAL HIGH (ref 65–99)
OSMOLALITY: 277 (ref 275–301)
POTASSIUM: 3.5 mmol/L (ref 3.5–5.1)
SGOT(AST): 15 U/L (ref 15–37)
Sodium: 136 mmol/L (ref 136–145)
Total Protein: 8.2 g/dL (ref 6.4–8.2)

## 2013-06-30 LAB — CBC
HCT: 41.7 % (ref 35.0–47.0)
HGB: 13.5 g/dL (ref 12.0–16.0)
MCH: 27.7 pg (ref 26.0–34.0)
MCHC: 32.3 g/dL (ref 32.0–36.0)
MCV: 86 fL (ref 80–100)
Platelet: 200 10*3/uL (ref 150–440)
RBC: 4.87 10*6/uL (ref 3.80–5.20)
RDW: 13.7 % (ref 11.5–14.5)
WBC: 8.6 10*3/uL (ref 3.6–11.0)

## 2013-06-30 LAB — LIPASE, BLOOD: Lipase: 171 U/L (ref 73–393)

## 2013-08-30 ENCOUNTER — Emergency Department: Payer: Self-pay | Admitting: Emergency Medicine

## 2013-08-30 LAB — CBC WITH DIFFERENTIAL/PLATELET
Comment - H1-Com1: NORMAL
Eosinophil: 2 %
HCT: 39.2 % (ref 35.0–47.0)
HGB: 13.2 g/dL (ref 12.0–16.0)
LYMPHS PCT: 29 %
MCH: 28.9 pg (ref 26.0–34.0)
MCHC: 33.8 g/dL (ref 32.0–36.0)
MCV: 86 fL (ref 80–100)
MONOS PCT: 4 %
PLATELETS: 180 10*3/uL (ref 150–440)
RBC: 4.58 10*6/uL (ref 3.80–5.20)
RDW: 12.7 % (ref 11.5–14.5)
SEGMENTED NEUTROPHILS: 63 %
VARIANT LYMPHOCYTE - H1-RLYMPH: 2 %
WBC: 7 10*3/uL (ref 3.6–11.0)

## 2013-08-30 LAB — COMPREHENSIVE METABOLIC PANEL
ALK PHOS: 55 U/L
AST: 6 U/L — AB (ref 15–37)
Albumin: 3.7 g/dL (ref 3.4–5.0)
Anion Gap: 4 — ABNORMAL LOW (ref 7–16)
BILIRUBIN TOTAL: 0.2 mg/dL (ref 0.2–1.0)
BUN: 7 mg/dL (ref 7–18)
CHLORIDE: 105 mmol/L (ref 98–107)
CO2: 27 mmol/L (ref 21–32)
Calcium, Total: 8.9 mg/dL (ref 8.5–10.1)
Creatinine: 0.5 mg/dL — ABNORMAL LOW (ref 0.60–1.30)
EGFR (African American): >60
EGFR (Non-African Amer.): >60
GLUCOSE: 285 mg/dL — AB (ref 65–99)
Osmolality: 280 (ref 275–301)
Potassium: 4 mmol/L (ref 3.5–5.1)
SGPT (ALT): 10 U/L — ABNORMAL LOW (ref 12–78)
SODIUM: 136 mmol/L (ref 136–145)
Total Protein: 7.6 g/dL (ref 6.4–8.2)

## 2013-08-30 LAB — URINALYSIS, COMPLETE
BACTERIA: NONE SEEN
BILIRUBIN, UR: NEGATIVE
Glucose,UR: >=500 mg/dL (ref 0–75)
KETONE: NEGATIVE
Leukocyte Esterase: NEGATIVE
Nitrite: NEGATIVE
Ph: 7 (ref 4.5–8.0)
Protein: NEGATIVE
RBC,UR: 16 /HPF (ref 0–5)
SPECIFIC GRAVITY: 1.023 (ref 1.003–1.030)
Squamous Epithelial: <1

## 2013-08-30 LAB — LIPASE, BLOOD: Lipase: 186 U/L (ref 73–393)

## 2013-09-17 ENCOUNTER — Emergency Department: Payer: Self-pay | Admitting: Emergency Medicine

## 2013-09-17 LAB — COMPREHENSIVE METABOLIC PANEL
ALT: 15 U/L (ref 12–78)
ANION GAP: 8 (ref 7–16)
Albumin: 3.9 g/dL (ref 3.4–5.0)
Alkaline Phosphatase: 55 U/L
BUN: 6 mg/dL — AB (ref 7–18)
Bilirubin,Total: 0.2 mg/dL (ref 0.2–1.0)
CREATININE: 0.64 mg/dL (ref 0.60–1.30)
Calcium, Total: 9.4 mg/dL (ref 8.5–10.1)
Chloride: 105 mmol/L (ref 98–107)
Co2: 26 mmol/L (ref 21–32)
GLUCOSE: 187 mg/dL — AB (ref 65–99)
Osmolality: 280 (ref 275–301)
Potassium: 3.7 mmol/L (ref 3.5–5.1)
SGOT(AST): 10 U/L — ABNORMAL LOW (ref 15–37)
Sodium: 139 mmol/L (ref 136–145)
TOTAL PROTEIN: 8 g/dL (ref 6.4–8.2)

## 2013-09-17 LAB — URINALYSIS, COMPLETE
Bilirubin,UR: NEGATIVE
Blood: NEGATIVE
Glucose,UR: 50 mg/dL (ref 0–75)
Ketone: NEGATIVE
LEUKOCYTE ESTERASE: NEGATIVE
Nitrite: NEGATIVE
Ph: 8 (ref 4.5–8.0)
SPECIFIC GRAVITY: 1.015 (ref 1.003–1.030)
WBC UR: 5 /HPF (ref 0–5)

## 2013-09-17 LAB — CBC
HCT: 40.2 % (ref 35.0–47.0)
HGB: 13.4 g/dL (ref 12.0–16.0)
MCH: 28.8 pg (ref 26.0–34.0)
MCHC: 33.3 g/dL (ref 32.0–36.0)
MCV: 87 fL (ref 80–100)
PLATELETS: 236 10*3/uL (ref 150–440)
RBC: 4.63 10*6/uL (ref 3.80–5.20)
RDW: 12.6 % (ref 11.5–14.5)
WBC: 9.3 10*3/uL (ref 3.6–11.0)

## 2013-09-17 LAB — LIPASE, BLOOD: Lipase: 165 U/L (ref 73–393)

## 2013-09-20 ENCOUNTER — Observation Stay: Payer: Self-pay | Admitting: Internal Medicine

## 2013-09-20 LAB — DRUG SCREEN, URINE

## 2013-09-20 LAB — COMPREHENSIVE METABOLIC PANEL
ALBUMIN: 3.6 g/dL (ref 3.4–5.0)
ANION GAP: 8 (ref 7–16)
Alkaline Phosphatase: 51 U/L
BUN: 4 mg/dL — AB (ref 7–18)
Bilirubin,Total: 0.1 mg/dL — ABNORMAL LOW (ref 0.2–1.0)
CALCIUM: 9 mg/dL (ref 8.5–10.1)
CHLORIDE: 104 mmol/L (ref 98–107)
Co2: 25 mmol/L (ref 21–32)
Creatinine: 0.71 mg/dL (ref 0.60–1.30)
EGFR (Non-African Amer.): >60
Glucose: 294 mg/dL — ABNORMAL HIGH (ref 65–99)
OSMOLALITY: 282 (ref 275–301)
Potassium: 3.6 mmol/L (ref 3.5–5.1)
SGOT(AST): 7 U/L — ABNORMAL LOW (ref 15–37)
SGPT (ALT): 14 U/L (ref 12–78)
Sodium: 137 mmol/L (ref 136–145)
TOTAL PROTEIN: 7.4 g/dL (ref 6.4–8.2)

## 2013-09-20 LAB — URINALYSIS, COMPLETE
BILIRUBIN, UR: NEGATIVE
Blood: NEGATIVE
Glucose,UR: 100 mg/dL (ref 0–75)
KETONE: NEGATIVE
LEUKOCYTE ESTERASE: NEGATIVE
Nitrite: POSITIVE
PH: 7 (ref 4.5–8.0)
Protein: NEGATIVE
RBC,UR: NONE SEEN /HPF (ref 0–5)
Specific Gravity: 1.01 (ref 1.003–1.030)

## 2013-09-20 LAB — CBC
HCT: 40 % (ref 35.0–47.0)
HGB: 12.9 g/dL (ref 12.0–16.0)
MCH: 28.1 pg (ref 26.0–34.0)
MCHC: 32.4 g/dL (ref 32.0–36.0)
MCV: 87 fL (ref 80–100)
PLATELETS: 320 10*3/uL (ref 150–440)
RBC: 4.6 10*6/uL (ref 3.80–5.20)
RDW: 12.8 % (ref 11.5–14.5)
WBC: 8.8 10*3/uL (ref 3.6–11.0)

## 2013-09-20 LAB — CK: CK, Total: 40 U/L

## 2013-09-20 LAB — LIPASE, BLOOD: LIPASE: 171 U/L (ref 73–393)

## 2013-09-24 ENCOUNTER — Emergency Department: Payer: Self-pay | Admitting: Emergency Medicine

## 2013-09-25 LAB — CBC WITH DIFFERENTIAL/PLATELET
BASOS ABS: 0.2 10*3/uL — AB (ref 0.0–0.1)
BASOS PCT: 2.7 %
EOS ABS: 0.5 10*3/uL (ref 0.0–0.7)
Eosinophil %: 6.6 %
HCT: 41.3 % (ref 35.0–47.0)
HGB: 13.9 g/dL (ref 12.0–16.0)
LYMPHS ABS: 3.4 10*3/uL (ref 1.0–3.6)
Lymphocyte %: 42.7 %
MCH: 29 pg (ref 26.0–34.0)
MCHC: 33.6 g/dL (ref 32.0–36.0)
MCV: 86 fL (ref 80–100)
MONO ABS: 0.6 x10 3/mm (ref 0.2–0.9)
MONOS PCT: 7.4 %
NEUTROS ABS: 3.2 10*3/uL (ref 1.4–6.5)
NEUTROS PCT: 40.6 %
Platelet: 281 10*3/uL (ref 150–440)
RBC: 4.8 10*6/uL (ref 3.80–5.20)
RDW: 12.8 % (ref 11.5–14.5)
WBC: 8 10*3/uL (ref 3.6–11.0)

## 2013-09-25 LAB — COMPREHENSIVE METABOLIC PANEL
ANION GAP: 10 (ref 7–16)
AST: 17 U/L (ref 15–37)
Albumin: 4.2 g/dL (ref 3.4–5.0)
Alkaline Phosphatase: 58 U/L
BUN: 6 mg/dL — ABNORMAL LOW (ref 7–18)
Bilirubin,Total: 0.2 mg/dL (ref 0.2–1.0)
CALCIUM: 9.8 mg/dL (ref 8.5–10.1)
CO2: 24 mmol/L (ref 21–32)
Chloride: 105 mmol/L (ref 98–107)
Creatinine: 0.6 mg/dL (ref 0.60–1.30)
EGFR (African American): >60
EGFR (Non-African Amer.): >60
Glucose: 209 mg/dL — ABNORMAL HIGH (ref 65–99)
OSMOLALITY: 281 (ref 275–301)
Potassium: 3.5 mmol/L (ref 3.5–5.1)
SGPT (ALT): 17 U/L (ref 12–78)
Sodium: 139 mmol/L (ref 136–145)
TOTAL PROTEIN: 8.9 g/dL — AB (ref 6.4–8.2)

## 2013-09-25 LAB — LIPASE, BLOOD: Lipase: 172 U/L (ref 73–393)

## 2013-09-26 ENCOUNTER — Emergency Department: Payer: Self-pay | Admitting: Emergency Medicine

## 2013-09-26 LAB — CBC
HCT: 37.5 %
HCT: 38.2 %
HGB: 12.7 g/dL
HGB: 12.8 g/dL
MCH: 28.8 pg
MCH: 29.1 pg
MCHC: 33.5 g/dL
MCHC: 34 g/dL
MCV: 86 fL
MCV: 86 fL
Platelet: 256 x10 3/mm 3
Platelet: 273 x10 3/mm 3
RBC: 4.38 X10 6/mm 3
RBC: 4.44 X10 6/mm 3
RDW: 12.5 %
RDW: 12.8 %
WBC: 7 x10 3/mm 3
WBC: 7.9 x10 3/mm 3

## 2013-09-26 LAB — COMPREHENSIVE METABOLIC PANEL WITH GFR
Albumin: 3.7 g/dL
Alkaline Phosphatase: 48 U/L
Anion Gap: 7
BUN: 6 mg/dL — ABNORMAL LOW
Bilirubin,Total: 0.2 mg/dL
Calcium, Total: 9.2 mg/dL
Chloride: 103 mmol/L
Co2: 28 mmol/L
Creatinine: 0.58 mg/dL — ABNORMAL LOW
EGFR (African American): >60
EGFR (Non-African Amer.): >60
Glucose: 286 mg/dL — ABNORMAL HIGH
Osmolality: 284
Potassium: 3.6 mmol/L
SGOT(AST): 8 U/L — ABNORMAL LOW
SGPT (ALT): 15 U/L
Sodium: 138 mmol/L
Total Protein: 7.7 g/dL

## 2013-09-26 LAB — BASIC METABOLIC PANEL
ANION GAP: 11 (ref 7–16)
BUN: 6 mg/dL — ABNORMAL LOW (ref 7–18)
CHLORIDE: 104 mmol/L (ref 98–107)
CO2: 24 mmol/L (ref 21–32)
CREATININE: 0.63 mg/dL (ref 0.60–1.30)
Calcium, Total: 9.6 mg/dL (ref 8.5–10.1)
EGFR (African American): >60
Glucose: 277 mg/dL — ABNORMAL HIGH (ref 65–99)
Osmolality: 285 (ref 275–301)
Potassium: 3.5 mmol/L (ref 3.5–5.1)
Sodium: 139 mmol/L (ref 136–145)

## 2013-09-27 ENCOUNTER — Ambulatory Visit: Payer: Self-pay | Admitting: Neurology

## 2013-09-27 ENCOUNTER — Inpatient Hospital Stay: Payer: Self-pay | Admitting: Internal Medicine

## 2013-09-27 LAB — URINALYSIS, COMPLETE
Bacteria: NEGATIVE
Bilirubin,UR: NEGATIVE
Leukocyte Esterase: NEGATIVE
Nitrite: NEGATIVE
Ph: 9 (ref 4.5–8.0)
Protein: NEGATIVE
SPECIFIC GRAVITY: 1.016 (ref 1.003–1.030)

## 2013-09-27 LAB — DRUG SCREEN, URINE

## 2013-09-28 LAB — BASIC METABOLIC PANEL
Anion Gap: 10 (ref 7–16)
BUN: 7 mg/dL (ref 7–18)
CO2: 26 mmol/L (ref 21–32)
Calcium, Total: 9 mg/dL (ref 8.5–10.1)
Chloride: 101 mmol/L (ref 98–107)
Creatinine: 0.63 mg/dL (ref 0.60–1.30)
EGFR (African American): >60
EGFR (Non-African Amer.): >60
Glucose: 200 mg/dL — ABNORMAL HIGH (ref 65–99)
OSMOLALITY: 277 (ref 275–301)
POTASSIUM: 3.1 mmol/L — AB (ref 3.5–5.1)
Sodium: 137 mmol/L (ref 136–145)

## 2013-09-28 LAB — CBC WITH DIFFERENTIAL/PLATELET
Basophil #: 0.1 10*3/uL (ref 0.0–0.1)
Basophil %: 1.2 %
EOS PCT: 0 %
Eosinophil #: 0 10*3/uL (ref 0.0–0.7)
HCT: 39 % (ref 35.0–47.0)
HGB: 13.3 g/dL (ref 12.0–16.0)
Lymphocyte #: 2.2 10*3/uL (ref 1.0–3.6)
Lymphocyte %: 21.6 %
MCH: 29 pg (ref 26.0–34.0)
MCHC: 34 g/dL (ref 32.0–36.0)
MCV: 85 fL (ref 80–100)
MONO ABS: 0.6 x10 3/mm (ref 0.2–0.9)
MONOS PCT: 5.7 %
NEUTROS ABS: 7.1 10*3/uL — AB (ref 1.4–6.5)
Neutrophil %: 71.5 %
Platelet: 258 10*3/uL (ref 150–440)
RBC: 4.58 10*6/uL (ref 3.80–5.20)
RDW: 12.8 % (ref 11.5–14.5)
WBC: 10 10*3/uL (ref 3.6–11.0)

## 2013-09-30 LAB — BASIC METABOLIC PANEL
ANION GAP: 13 (ref 7–16)
BUN: 5 mg/dL — ABNORMAL LOW (ref 7–18)
CHLORIDE: 101 mmol/L (ref 98–107)
CO2: 24 mmol/L (ref 21–32)
Calcium, Total: 9.2 mg/dL (ref 8.5–10.1)
Creatinine: 0.44 mg/dL — ABNORMAL LOW (ref 0.60–1.30)
EGFR (African American): >60
EGFR (Non-African Amer.): >60
GLUCOSE: 136 mg/dL — AB (ref 65–99)
OSMOLALITY: 275 (ref 275–301)
Potassium: 3.1 mmol/L — ABNORMAL LOW (ref 3.5–5.1)
SODIUM: 138 mmol/L (ref 136–145)

## 2013-09-30 LAB — CBC WITH DIFFERENTIAL/PLATELET
BASOS ABS: 0.1 10*3/uL (ref 0.0–0.1)
BASOS PCT: 0.8 %
Eosinophil #: 0.1 10*3/uL (ref 0.0–0.7)
Eosinophil %: 1.2 %
HCT: 38 % (ref 35.0–47.0)
HGB: 12.7 g/dL (ref 12.0–16.0)
LYMPHS PCT: 34.4 %
Lymphocyte #: 3.8 10*3/uL — ABNORMAL HIGH (ref 1.0–3.6)
MCH: 29.1 pg (ref 26.0–34.0)
MCHC: 33.5 g/dL (ref 32.0–36.0)
MCV: 87 fL (ref 80–100)
Monocyte #: 1 x10 3/mm — ABNORMAL HIGH (ref 0.2–0.9)
Monocyte %: 9.2 %
Neutrophil #: 6 10*3/uL (ref 1.4–6.5)
Neutrophil %: 54.4 %
Platelet: 283 10*3/uL (ref 150–440)
RBC: 4.38 10*6/uL (ref 3.80–5.20)
RDW: 12.5 % (ref 11.5–14.5)
WBC: 11 10*3/uL (ref 3.6–11.0)

## 2013-09-30 LAB — SEDIMENTATION RATE: Erythrocyte Sed Rate: 30 mm/hr — ABNORMAL HIGH (ref 0–20)

## 2013-10-04 LAB — CREATININE, SERUM
Creatinine: 0.58 mg/dL — ABNORMAL LOW (ref 0.60–1.30)
EGFR (Non-African Amer.): >60

## 2013-10-06 ENCOUNTER — Emergency Department: Payer: Self-pay | Admitting: Emergency Medicine

## 2013-10-06 LAB — CBC WITH DIFFERENTIAL/PLATELET
BASOS ABS: 0.1 10*3/uL (ref 0.0–0.1)
Basophil %: 0.6 %
EOS ABS: 0.2 10*3/uL (ref 0.0–0.7)
Eosinophil %: 2.5 %
HCT: 36.7 % (ref 35.0–47.0)
HGB: 12.3 g/dL (ref 12.0–16.0)
LYMPHS ABS: 2.4 10*3/uL (ref 1.0–3.6)
Lymphocyte %: 29.2 %
MCH: 29.1 pg (ref 26.0–34.0)
MCHC: 33.5 g/dL (ref 32.0–36.0)
MCV: 87 fL (ref 80–100)
Monocyte #: 0.6 x10 3/mm (ref 0.2–0.9)
Monocyte %: 7.7 %
NEUTROS ABS: 4.9 10*3/uL (ref 1.4–6.5)
Neutrophil %: 60 %
Platelet: 243 10*3/uL (ref 150–440)
RBC: 4.23 10*6/uL (ref 3.80–5.20)
RDW: 12.7 % (ref 11.5–14.5)
WBC: 8.2 10*3/uL (ref 3.6–11.0)

## 2013-10-06 LAB — COMPREHENSIVE METABOLIC PANEL
ANION GAP: 10 (ref 7–16)
AST: 18 U/L (ref 15–37)
Albumin: 3.5 g/dL (ref 3.4–5.0)
Alkaline Phosphatase: 43 U/L — ABNORMAL LOW
BUN: 4 mg/dL — ABNORMAL LOW (ref 7–18)
Bilirubin,Total: 0.1 mg/dL — ABNORMAL LOW (ref 0.2–1.0)
CHLORIDE: 104 mmol/L (ref 98–107)
CO2: 25 mmol/L (ref 21–32)
CREATININE: 0.68 mg/dL (ref 0.60–1.30)
Calcium, Total: 8.9 mg/dL (ref 8.5–10.1)
EGFR (Non-African Amer.): >60
GLUCOSE: 309 mg/dL — AB (ref 65–99)
Osmolality: 286 (ref 275–301)
Potassium: 3.3 mmol/L — ABNORMAL LOW (ref 3.5–5.1)
SGPT (ALT): 13 U/L (ref 12–78)
Sodium: 139 mmol/L (ref 136–145)
Total Protein: 7.6 g/dL (ref 6.4–8.2)

## 2013-10-06 LAB — LIPASE, BLOOD: Lipase: 278 U/L (ref 73–393)

## 2013-10-07 LAB — URINALYSIS, COMPLETE
BILIRUBIN, UR: NEGATIVE
Blood: NEGATIVE
Glucose,UR: >=500 mg/dL (ref 0–75)
Ketone: NEGATIVE
LEUKOCYTE ESTERASE: NEGATIVE
Nitrite: NEGATIVE
Ph: 7 (ref 4.5–8.0)
Protein: 30
RBC,UR: 2 /HPF (ref 0–5)
Specific Gravity: 1.033 (ref 1.003–1.030)

## 2013-11-08 ENCOUNTER — Emergency Department: Payer: Self-pay | Admitting: Emergency Medicine

## 2013-11-08 LAB — URINALYSIS, COMPLETE
BLOOD: NEGATIVE
Bilirubin,UR: NEGATIVE
Glucose,UR: >=500 mg/dL (ref 0–75)
KETONE: NEGATIVE
Leukocyte Esterase: NEGATIVE
Nitrite: NEGATIVE
PH: 8 (ref 4.5–8.0)
Protein: 30
RBC,UR: 2 /HPF (ref 0–5)
Specific Gravity: 1.014 (ref 1.003–1.030)
WBC UR: 6 /HPF (ref 0–5)

## 2013-11-08 LAB — COMPREHENSIVE METABOLIC PANEL
ALK PHOS: 54 U/L
Albumin: 4 g/dL (ref 3.4–5.0)
Anion Gap: 9 (ref 7–16)
BILIRUBIN TOTAL: 0.3 mg/dL (ref 0.2–1.0)
BUN: 7 mg/dL (ref 7–18)
CALCIUM: 9.2 mg/dL (ref 8.5–10.1)
CHLORIDE: 103 mmol/L (ref 98–107)
CREATININE: 0.54 mg/dL — AB (ref 0.60–1.30)
Co2: 25 mmol/L (ref 21–32)
EGFR (Non-African Amer.): >60
GLUCOSE: 246 mg/dL — AB (ref 65–99)
OSMOLALITY: 280 (ref 275–301)
POTASSIUM: 4 mmol/L (ref 3.5–5.1)
SGOT(AST): 11 U/L — ABNORMAL LOW (ref 15–37)
SGPT (ALT): 9 U/L — ABNORMAL LOW
SODIUM: 137 mmol/L (ref 136–145)
TOTAL PROTEIN: 8.1 g/dL (ref 6.4–8.2)

## 2013-11-08 LAB — LIPASE, BLOOD: Lipase: 152 U/L (ref 73–393)

## 2013-11-08 LAB — CBC
HCT: 36.7 % (ref 35.0–47.0)
HGB: 11.6 g/dL — ABNORMAL LOW (ref 12.0–16.0)
MCH: 27.8 pg (ref 26.0–34.0)
MCHC: 31.7 g/dL — AB (ref 32.0–36.0)
MCV: 88 fL (ref 80–100)
PLATELETS: 227 10*3/uL (ref 150–440)
RBC: 4.19 10*6/uL (ref 3.80–5.20)
RDW: 12.7 % (ref 11.5–14.5)
WBC: 11.3 10*3/uL — AB (ref 3.6–11.0)

## 2013-12-07 ENCOUNTER — Emergency Department: Payer: Self-pay | Admitting: Emergency Medicine

## 2013-12-07 LAB — BASIC METABOLIC PANEL
ANION GAP: 11 (ref 7–16)
BUN: 5 mg/dL — AB (ref 7–18)
CALCIUM: 8.9 mg/dL (ref 8.5–10.1)
Chloride: 105 mmol/L (ref 98–107)
Co2: 22 mmol/L (ref 21–32)
Creatinine: 0.51 mg/dL — ABNORMAL LOW (ref 0.60–1.30)
EGFR (African American): >60
EGFR (Non-African Amer.): >60
GLUCOSE: 193 mg/dL — AB (ref 65–99)
Osmolality: 278 (ref 275–301)
Potassium: 3.3 mmol/L — ABNORMAL LOW (ref 3.5–5.1)
Sodium: 138 mmol/L (ref 136–145)

## 2013-12-07 LAB — CBC
HCT: 39.3 % (ref 35.0–47.0)
HGB: 12.7 g/dL (ref 12.0–16.0)
MCH: 28.2 pg (ref 26.0–34.0)
MCHC: 32.3 g/dL (ref 32.0–36.0)
MCV: 87 fL (ref 80–100)
Platelet: 212 10*3/uL (ref 150–440)
RBC: 4.51 10*6/uL (ref 3.80–5.20)
RDW: 13.1 % (ref 11.5–14.5)
WBC: 11.5 10*3/uL — ABNORMAL HIGH (ref 3.6–11.0)

## 2013-12-31 LAB — PATHOLOGY REPORT

## 2014-01-04 ENCOUNTER — Emergency Department: Payer: Self-pay | Admitting: Emergency Medicine

## 2014-01-04 LAB — CBC
HCT: 41.9 % (ref 35.0–47.0)
HGB: 13.7 g/dL (ref 12.0–16.0)
MCH: 28.1 pg (ref 26.0–34.0)
MCHC: 32.6 g/dL (ref 32.0–36.0)
MCV: 86 fL (ref 80–100)
Platelet: 195 10*3/uL (ref 150–440)
RBC: 4.87 10*6/uL (ref 3.80–5.20)
RDW: 13 % (ref 11.5–14.5)
WBC: 7.2 10*3/uL (ref 3.6–11.0)

## 2014-01-04 LAB — URINALYSIS, COMPLETE
BILIRUBIN, UR: NEGATIVE
Blood: NEGATIVE
Glucose,UR: >=500 mg/dL (ref 0–75)
Ketone: NEGATIVE
Leukocyte Esterase: NEGATIVE
Nitrite: NEGATIVE
Ph: 7 (ref 4.5–8.0)
Protein: NEGATIVE
RBC,UR: 1 /HPF (ref 0–5)
SPECIFIC GRAVITY: 1.028 (ref 1.003–1.030)

## 2014-01-04 LAB — COMPREHENSIVE METABOLIC PANEL
ALK PHOS: 69 U/L
ALT: 13 U/L — AB
ANION GAP: 8 (ref 7–16)
AST: 16 U/L (ref 15–37)
Albumin: 4 g/dL (ref 3.4–5.0)
BILIRUBIN TOTAL: 0.2 mg/dL (ref 0.2–1.0)
BUN: 8 mg/dL (ref 7–18)
CREATININE: 0.66 mg/dL (ref 0.60–1.30)
Calcium, Total: 9.2 mg/dL (ref 8.5–10.1)
Chloride: 102 mmol/L (ref 98–107)
Co2: 30 mmol/L (ref 21–32)
EGFR (African American): >60
Glucose: 308 mg/dL — ABNORMAL HIGH (ref 65–99)
Osmolality: 289 (ref 275–301)
Potassium: 3.8 mmol/L (ref 3.5–5.1)
Sodium: 140 mmol/L (ref 136–145)
Total Protein: 8.6 g/dL — ABNORMAL HIGH (ref 6.4–8.2)

## 2014-01-04 LAB — DRUG SCREEN, URINE
Amphetamines, Ur Screen: NEGATIVE (ref ?–1000)
BARBITURATES, UR SCREEN: NEGATIVE (ref ?–200)
Benzodiazepine, Ur Scrn: NEGATIVE (ref ?–200)
COCAINE METABOLITE, UR ~~LOC~~: NEGATIVE (ref ?–300)
Cannabinoid 50 Ng, Ur ~~LOC~~: POSITIVE (ref ?–50)
MDMA (ECSTASY) UR SCREEN: NEGATIVE (ref ?–500)
Methadone, Ur Screen: NEGATIVE (ref ?–300)
Opiate, Ur Screen: NEGATIVE (ref ?–300)
PHENCYCLIDINE (PCP) UR S: NEGATIVE (ref ?–25)
TRICYCLIC, UR SCREEN: NEGATIVE (ref ?–1000)

## 2014-01-04 LAB — ETHANOL: Ethanol: <3 mg/dL

## 2014-02-24 ENCOUNTER — Emergency Department: Payer: Self-pay | Admitting: Emergency Medicine

## 2014-02-24 LAB — BASIC METABOLIC PANEL
Anion Gap: 9 (ref 7–16)
BUN: 8 mg/dL (ref 7–18)
Calcium, Total: 9.1 mg/dL (ref 8.5–10.1)
Chloride: 103 mmol/L (ref 98–107)
Co2: 24 mmol/L (ref 21–32)
Creatinine: 0.56 mg/dL — ABNORMAL LOW (ref 0.60–1.30)
EGFR (African American): >60
Glucose: 272 mg/dL — ABNORMAL HIGH (ref 65–99)
Osmolality: 280 (ref 275–301)
POTASSIUM: 3.9 mmol/L (ref 3.5–5.1)
Sodium: 136 mmol/L (ref 136–145)

## 2014-02-24 LAB — COMPREHENSIVE METABOLIC PANEL
ALBUMIN: 3.9 g/dL (ref 3.4–5.0)
ANION GAP: 13 (ref 7–16)
Alkaline Phosphatase: 58 U/L
BUN: 11 mg/dL (ref 7–18)
Bilirubin,Total: 0.4 mg/dL (ref 0.2–1.0)
CHLORIDE: 106 mmol/L (ref 98–107)
Calcium, Total: 9.1 mg/dL (ref 8.5–10.1)
Co2: 19 mmol/L — ABNORMAL LOW (ref 21–32)
Creatinine: 0.59 mg/dL — ABNORMAL LOW (ref 0.60–1.30)
EGFR (African American): >60
GLUCOSE: 295 mg/dL — AB (ref 65–99)
Osmolality: 286 (ref 275–301)
Potassium: 3.6 mmol/L (ref 3.5–5.1)
SGOT(AST): 8 U/L — ABNORMAL LOW (ref 15–37)
SGPT (ALT): 15 U/L
SODIUM: 138 mmol/L (ref 136–145)
TOTAL PROTEIN: 8 g/dL (ref 6.4–8.2)

## 2014-02-24 LAB — CBC
HCT: 42.7 % (ref 35.0–47.0)
HCT: 42.1 % (ref 35.0–47.0)
HGB: 14 g/dL (ref 12.0–16.0)
HGB: 13.6 g/dL (ref 12.0–16.0)
MCH: 28.3 pg (ref 26.0–34.0)
MCH: 27.8 pg (ref 26.0–34.0)
MCHC: 32.8 g/dL (ref 32.0–36.0)
MCHC: 32.2 g/dL (ref 32.0–36.0)
MCV: 86 fL (ref 80–100)
MCV: 86 fL (ref 80–100)
PLATELETS: 177 10*3/uL (ref 150–440)
Platelet: 194 10*3/uL (ref 150–440)
RBC: 4.95 10*6/uL (ref 3.80–5.20)
RBC: 4.89 10*6/uL (ref 3.80–5.20)
RDW: 13.7 % (ref 11.5–14.5)
RDW: 13.8 % (ref 11.5–14.5)
WBC: 8.2 10*3/uL (ref 3.6–11.0)
WBC: 19.3 10*3/uL — ABNORMAL HIGH (ref 3.6–11.0)

## 2014-02-24 LAB — URINALYSIS, COMPLETE
Bilirubin,UR: NEGATIVE
Blood: NEGATIVE
Glucose,UR: >=500 mg/dL (ref 0–75)
Leukocyte Esterase: NEGATIVE
NITRITE: NEGATIVE
Ph: 7 (ref 4.5–8.0)
SPECIFIC GRAVITY: 1.03 (ref 1.003–1.030)
WBC UR: 1 /HPF (ref 0–5)

## 2014-02-24 LAB — LIPASE, BLOOD
LIPASE: 61 U/L — AB (ref 73–393)
Lipase: 212 U/L (ref 73–393)

## 2014-02-25 ENCOUNTER — Observation Stay: Payer: Self-pay | Admitting: Internal Medicine

## 2014-02-25 LAB — DRUG SCREEN, URINE
Amphetamines, Ur Screen: NEGATIVE (ref ?–1000)
BENZODIAZEPINE, UR SCRN: NEGATIVE (ref ?–200)
Barbiturates, Ur Screen: NEGATIVE (ref ?–200)
Cannabinoid 50 Ng, Ur ~~LOC~~: POSITIVE (ref ?–50)
Cocaine Metabolite,Ur ~~LOC~~: NEGATIVE (ref ?–300)
MDMA (Ecstasy)Ur Screen: NEGATIVE (ref ?–500)
METHADONE, UR SCREEN: NEGATIVE (ref ?–300)
OPIATE, UR SCREEN: NEGATIVE (ref ?–300)
Phencyclidine (PCP) Ur S: NEGATIVE (ref ?–25)
Tricyclic, Ur Screen: NEGATIVE (ref ?–1000)

## 2014-02-25 LAB — CBC
HCT: 40.9 % (ref 35.0–47.0)
HGB: 13.1 g/dL (ref 12.0–16.0)
MCH: 27.7 pg (ref 26.0–34.0)
MCHC: 32.1 g/dL (ref 32.0–36.0)
MCV: 86 fL (ref 80–100)
Platelet: 202 10*3/uL (ref 150–440)
RBC: 4.74 10*6/uL (ref 3.80–5.20)
RDW: 13.9 % (ref 11.5–14.5)
WBC: 18.6 10*3/uL — AB (ref 3.6–11.0)

## 2014-02-25 LAB — URINALYSIS, COMPLETE
BILIRUBIN, UR: NEGATIVE
Bacteria: NONE SEEN
Glucose,UR: >=500 mg/dL (ref 0–75)
LEUKOCYTE ESTERASE: NEGATIVE
NITRITE: NEGATIVE
Ph: 6 (ref 4.5–8.0)
SPECIFIC GRAVITY: 1.032 (ref 1.003–1.030)
Squamous Epithelial: 1
WBC UR: 1 /HPF (ref 0–5)

## 2014-02-25 LAB — ETHANOL

## 2014-02-25 LAB — COMPREHENSIVE METABOLIC PANEL
ALBUMIN: 4 g/dL (ref 3.4–5.0)
ANION GAP: 19 — AB (ref 7–16)
Alkaline Phosphatase: 59 U/L
BILIRUBIN TOTAL: 0.6 mg/dL (ref 0.2–1.0)
BUN: 13 mg/dL (ref 7–18)
CALCIUM: 9 mg/dL (ref 8.5–10.1)
Chloride: 101 mmol/L (ref 98–107)
Co2: 17 mmol/L — ABNORMAL LOW (ref 21–32)
Creatinine: 0.7 mg/dL (ref 0.60–1.30)
EGFR (African American): >60
EGFR (Non-African Amer.): >60
Glucose: 292 mg/dL — ABNORMAL HIGH (ref 65–99)
OSMOLALITY: 285 (ref 275–301)
POTASSIUM: 3.7 mmol/L (ref 3.5–5.1)
SGOT(AST): 7 U/L — ABNORMAL LOW (ref 15–37)
SGPT (ALT): 14 U/L
Sodium: 137 mmol/L (ref 136–145)
Total Protein: 8.3 g/dL — ABNORMAL HIGH (ref 6.4–8.2)

## 2014-02-25 LAB — CK TOTAL AND CKMB (NOT AT ARMC)
CK, TOTAL: 71 U/L (ref 26–192)
CK, Total: 83 U/L (ref 26–192)
CK, Total: 91 U/L (ref 26–192)
CK-MB: <0.5 ng/mL — ABNORMAL LOW (ref 0.5–3.6)
CK-MB: <0.5 ng/mL — ABNORMAL LOW (ref 0.5–3.6)

## 2014-02-25 LAB — TROPONIN I
Troponin-I: <0.02 ng/mL
Troponin-I: <0.02 ng/mL

## 2014-02-26 LAB — CBC WITH DIFFERENTIAL/PLATELET
BASOS ABS: 0.1 10*3/uL (ref 0.0–0.1)
Basophil %: 0.6 %
EOS ABS: 0 10*3/uL (ref 0.0–0.7)
EOS PCT: 0 %
HCT: 39.9 % (ref 35.0–47.0)
HGB: 12.8 g/dL (ref 12.0–16.0)
LYMPHS PCT: 8.7 %
Lymphocyte #: 1.4 10*3/uL (ref 1.0–3.6)
MCH: 27.9 pg (ref 26.0–34.0)
MCHC: 32.1 g/dL (ref 32.0–36.0)
MCV: 87 fL (ref 80–100)
MONOS PCT: 3.5 %
Monocyte #: 0.6 x10 3/mm (ref 0.2–0.9)
Neutrophil #: 13.9 10*3/uL — ABNORMAL HIGH (ref 1.4–6.5)
Neutrophil %: 87.2 %
Platelet: 183 10*3/uL (ref 150–440)
RBC: 4.6 10*6/uL (ref 3.80–5.20)
RDW: 13.9 % (ref 11.5–14.5)
WBC: 16 10*3/uL — ABNORMAL HIGH (ref 3.6–11.0)

## 2014-02-26 LAB — BASIC METABOLIC PANEL
Anion Gap: 12 (ref 7–16)
BUN: 9 mg/dL (ref 7–18)
Calcium, Total: 8.4 mg/dL — ABNORMAL LOW (ref 8.5–10.1)
Chloride: 107 mmol/L (ref 98–107)
Co2: 21 mmol/L (ref 21–32)
Creatinine: 0.43 mg/dL — ABNORMAL LOW (ref 0.60–1.30)
EGFR (Non-African Amer.): >60
Glucose: 182 mg/dL — ABNORMAL HIGH (ref 65–99)
OSMOLALITY: 283 (ref 275–301)
POTASSIUM: 3.4 mmol/L — AB (ref 3.5–5.1)
Sodium: 140 mmol/L (ref 136–145)

## 2014-02-27 LAB — CBC WITH DIFFERENTIAL/PLATELET
BASOS ABS: 0.1 10*3/uL (ref 0.0–0.1)
Basophil %: 1.1 %
Eosinophil #: 0 10*3/uL (ref 0.0–0.7)
Eosinophil %: 0.2 %
HCT: 35.9 % (ref 35.0–47.0)
HGB: 11.6 g/dL — ABNORMAL LOW (ref 12.0–16.0)
Lymphocyte #: 3.3 10*3/uL (ref 1.0–3.6)
Lymphocyte %: 38 %
MCH: 28 pg (ref 26.0–34.0)
MCHC: 32.4 g/dL (ref 32.0–36.0)
MCV: 87 fL (ref 80–100)
MONOS PCT: 7.7 %
Monocyte #: 0.7 x10 3/mm (ref 0.2–0.9)
Neutrophil #: 4.6 10*3/uL (ref 1.4–6.5)
Neutrophil %: 53 %
PLATELETS: 178 10*3/uL (ref 150–440)
RBC: 4.15 10*6/uL (ref 3.80–5.20)
RDW: 13.7 % (ref 11.5–14.5)
WBC: 8.8 10*3/uL (ref 3.6–11.0)

## 2014-02-27 LAB — BASIC METABOLIC PANEL
Anion Gap: 6 — ABNORMAL LOW (ref 7–16)
BUN: 6 mg/dL — ABNORMAL LOW (ref 7–18)
Calcium, Total: 8.2 mg/dL — ABNORMAL LOW (ref 8.5–10.1)
Chloride: 104 mmol/L (ref 98–107)
Co2: 29 mmol/L (ref 21–32)
Creatinine: 0.5 mg/dL — ABNORMAL LOW (ref 0.60–1.30)
EGFR (African American): >60
EGFR (Non-African Amer.): >60
Glucose: 189 mg/dL — ABNORMAL HIGH (ref 65–99)
OSMOLALITY: 280 (ref 275–301)
Potassium: 3.5 mmol/L (ref 3.5–5.1)
Sodium: 139 mmol/L (ref 136–145)

## 2014-03-01 ENCOUNTER — Emergency Department: Payer: Self-pay | Admitting: Emergency Medicine

## 2014-03-01 LAB — CBC WITH DIFFERENTIAL/PLATELET
BASOS ABS: 0.1 10*3/uL (ref 0.0–0.1)
Basophil %: 0.6 %
Eosinophil #: 0.1 10*3/uL (ref 0.0–0.7)
Eosinophil %: 1.3 %
HCT: 38.9 % (ref 35.0–47.0)
HGB: 12.7 g/dL (ref 12.0–16.0)
LYMPHS ABS: 4.1 10*3/uL — AB (ref 1.0–3.6)
Lymphocyte %: 41.6 %
MCH: 28 pg (ref 26.0–34.0)
MCHC: 32.5 g/dL (ref 32.0–36.0)
MCV: 86 fL (ref 80–100)
MONOS PCT: 7.2 %
Monocyte #: 0.7 x10 3/mm (ref 0.2–0.9)
NEUTROS PCT: 49.3 %
Neutrophil #: 4.9 10*3/uL (ref 1.4–6.5)
Platelet: 210 10*3/uL (ref 150–440)
RBC: 4.53 10*6/uL (ref 3.80–5.20)
RDW: 13.2 % (ref 11.5–14.5)
WBC: 9.9 10*3/uL (ref 3.6–11.0)

## 2014-03-01 LAB — URINALYSIS, COMPLETE
BILIRUBIN, UR: NEGATIVE
Glucose,UR: >=500 mg/dL (ref 0–75)
Leukocyte Esterase: NEGATIVE
Nitrite: NEGATIVE
PROTEIN: NEGATIVE
Ph: 8 (ref 4.5–8.0)
Specific Gravity: 1.018 (ref 1.003–1.030)
Squamous Epithelial: 2

## 2014-03-01 LAB — COMPREHENSIVE METABOLIC PANEL
ALBUMIN: 3.7 g/dL (ref 3.4–5.0)
ALT: 15 U/L
Alkaline Phosphatase: 49 U/L
Anion Gap: 10 (ref 7–16)
BUN: 5 mg/dL — ABNORMAL LOW (ref 7–18)
Bilirubin,Total: 0.3 mg/dL (ref 0.2–1.0)
Calcium, Total: 9 mg/dL (ref 8.5–10.1)
Chloride: 103 mmol/L (ref 98–107)
Co2: 24 mmol/L (ref 21–32)
Creatinine: 0.59 mg/dL — ABNORMAL LOW (ref 0.60–1.30)
EGFR (African American): >60
EGFR (Non-African Amer.): >60
Glucose: 269 mg/dL — ABNORMAL HIGH (ref 65–99)
Osmolality: 281 (ref 275–301)
Potassium: 3.2 mmol/L — ABNORMAL LOW (ref 3.5–5.1)
SGOT(AST): 7 U/L — ABNORMAL LOW (ref 15–37)
SODIUM: 137 mmol/L (ref 136–145)
TOTAL PROTEIN: 7.3 g/dL (ref 6.4–8.2)

## 2014-03-01 LAB — LIPASE, BLOOD: Lipase: 161 U/L (ref 73–393)

## 2014-04-15 ENCOUNTER — Emergency Department: Payer: Self-pay | Admitting: Emergency Medicine

## 2014-04-15 LAB — URINALYSIS, COMPLETE
BACTERIA: NONE SEEN
Bilirubin,UR: NEGATIVE
LEUKOCYTE ESTERASE: NEGATIVE
Nitrite: NEGATIVE
Ph: 5 (ref 4.5–8.0)
RBC,UR: 2 /HPF (ref 0–5)
Specific Gravity: 1.028 (ref 1.003–1.030)
Squamous Epithelial: 4

## 2014-04-15 LAB — BASIC METABOLIC PANEL
ANION GAP: 16 (ref 7–16)
BUN: 9 mg/dL (ref 7–18)
CREATININE: 0.65 mg/dL (ref 0.60–1.30)
Calcium, Total: 9.8 mg/dL (ref 8.5–10.1)
Chloride: 101 mmol/L (ref 98–107)
Co2: 20 mmol/L — ABNORMAL LOW (ref 21–32)
EGFR (African American): >60
Glucose: 264 mg/dL — ABNORMAL HIGH (ref 65–99)
OSMOLALITY: 282 (ref 275–301)
POTASSIUM: 3.7 mmol/L (ref 3.5–5.1)
Sodium: 137 mmol/L (ref 136–145)

## 2014-04-15 LAB — COMPREHENSIVE METABOLIC PANEL
ANION GAP: 10 (ref 7–16)
Albumin: 4 g/dL (ref 3.4–5.0)
Alkaline Phosphatase: 59 U/L
BUN: 6 mg/dL — AB (ref 7–18)
Bilirubin,Total: 0.4 mg/dL (ref 0.2–1.0)
CALCIUM: 9.2 mg/dL (ref 8.5–10.1)
Chloride: 103 mmol/L (ref 98–107)
Co2: 25 mmol/L (ref 21–32)
Creatinine: 0.59 mg/dL — ABNORMAL LOW (ref 0.60–1.30)
EGFR (Non-African Amer.): >60
Glucose: 279 mg/dL — ABNORMAL HIGH (ref 65–99)
Osmolality: 283 (ref 275–301)
POTASSIUM: 3.3 mmol/L — AB (ref 3.5–5.1)
SGOT(AST): 16 U/L (ref 15–37)
SGPT (ALT): 17 U/L
Sodium: 138 mmol/L (ref 136–145)
TOTAL PROTEIN: 8.2 g/dL (ref 6.4–8.2)

## 2014-04-15 LAB — CBC WITH DIFFERENTIAL/PLATELET
Basophil #: 0.1 10*3/uL (ref 0.0–0.1)
Basophil %: 1 %
EOS ABS: 0 10*3/uL (ref 0.0–0.7)
Eosinophil %: 0.4 %
HCT: 40.8 % (ref 35.0–47.0)
HGB: 13.1 g/dL (ref 12.0–16.0)
LYMPHS PCT: 25.5 %
Lymphocyte #: 2.9 10*3/uL (ref 1.0–3.6)
MCH: 28.1 pg (ref 26.0–34.0)
MCHC: 32.2 g/dL (ref 32.0–36.0)
MCV: 87 fL (ref 80–100)
Monocyte #: 0.6 x10 3/mm (ref 0.2–0.9)
Monocyte %: 5.4 %
NEUTROS ABS: 7.8 10*3/uL — AB (ref 1.4–6.5)
Neutrophil %: 67.7 %
Platelet: 196 10*3/uL (ref 150–440)
RBC: 4.68 10*6/uL (ref 3.80–5.20)
RDW: 14.2 % (ref 11.5–14.5)
WBC: 11.5 10*3/uL — ABNORMAL HIGH (ref 3.6–11.0)

## 2014-04-15 LAB — LIPASE, BLOOD: Lipase: 84 U/L (ref 73–393)

## 2014-04-16 ENCOUNTER — Inpatient Hospital Stay: Payer: Self-pay | Admitting: Internal Medicine

## 2014-04-16 LAB — CBC
HCT: 41.1 % (ref 35.0–47.0)
HGB: 13.3 g/dL (ref 12.0–16.0)
MCH: 27.7 pg (ref 26.0–34.0)
MCHC: 32.3 g/dL (ref 32.0–36.0)
MCV: 86 fL (ref 80–100)
Platelet: 209 10*3/uL (ref 150–440)
RBC: 4.78 10*6/uL (ref 3.80–5.20)
RDW: 14 % (ref 11.5–14.5)
WBC: 14.5 10*3/uL — ABNORMAL HIGH (ref 3.6–11.0)

## 2014-04-16 LAB — COMPREHENSIVE METABOLIC PANEL
ALK PHOS: 57 U/L
ANION GAP: 14 (ref 7–16)
Albumin: 4.2 g/dL (ref 3.4–5.0)
BILIRUBIN TOTAL: 0.7 mg/dL (ref 0.2–1.0)
BUN: 13 mg/dL (ref 7–18)
CALCIUM: 9.7 mg/dL (ref 8.5–10.1)
CHLORIDE: 100 mmol/L (ref 98–107)
CO2: 23 mmol/L (ref 21–32)
CREATININE: 0.74 mg/dL (ref 0.60–1.30)
EGFR (Non-African Amer.): >60
Glucose: 248 mg/dL — ABNORMAL HIGH (ref 65–99)
OSMOLALITY: 282 (ref 275–301)
POTASSIUM: 3 mmol/L — AB (ref 3.5–5.1)
SGOT(AST): 8 U/L — ABNORMAL LOW (ref 15–37)
SGPT (ALT): 12 U/L — ABNORMAL LOW
SODIUM: 137 mmol/L (ref 136–145)
Total Protein: 8.1 g/dL (ref 6.4–8.2)

## 2014-04-16 LAB — MAGNESIUM: Magnesium: 1.8 mg/dL

## 2014-04-17 LAB — CBC WITH DIFFERENTIAL/PLATELET
BASOS ABS: 0 10*3/uL (ref 0.0–0.1)
Basophil %: 0.3 %
EOS PCT: 0 %
Eosinophil #: 0 10*3/uL (ref 0.0–0.7)
HCT: 41 % (ref 35.0–47.0)
HGB: 13 g/dL (ref 12.0–16.0)
Lymphocyte #: 2 10*3/uL (ref 1.0–3.6)
Lymphocyte %: 13.9 %
MCH: 27.5 pg (ref 26.0–34.0)
MCHC: 31.7 g/dL — ABNORMAL LOW (ref 32.0–36.0)
MCV: 87 fL (ref 80–100)
MONOS PCT: 5.8 %
Monocyte #: 0.8 x10 3/mm (ref 0.2–0.9)
NEUTROS PCT: 80 %
Neutrophil #: 11.3 10*3/uL — ABNORMAL HIGH (ref 1.4–6.5)
PLATELETS: 174 10*3/uL (ref 150–440)
RBC: 4.72 10*6/uL (ref 3.80–5.20)
RDW: 13.8 % (ref 11.5–14.5)
WBC: 14.1 10*3/uL — AB (ref 3.6–11.0)

## 2014-04-17 LAB — BASIC METABOLIC PANEL
ANION GAP: 14 (ref 7–16)
BUN: 8 mg/dL (ref 7–18)
CO2: 23 mmol/L (ref 21–32)
Calcium, Total: 8.8 mg/dL (ref 8.5–10.1)
Chloride: 101 mmol/L (ref 98–107)
Creatinine: 0.52 mg/dL — ABNORMAL LOW (ref 0.60–1.30)
EGFR (African American): >60
EGFR (Non-African Amer.): >60
Glucose: 184 mg/dL — ABNORMAL HIGH (ref 65–99)
Osmolality: 279 (ref 275–301)
POTASSIUM: 3.5 mmol/L (ref 3.5–5.1)
Sodium: 138 mmol/L (ref 136–145)

## 2014-04-18 LAB — CLOSTRIDIUM DIFFICILE(ARMC)

## 2014-04-19 LAB — MAGNESIUM: Magnesium: 1.5 mg/dL — ABNORMAL LOW

## 2014-04-19 LAB — BASIC METABOLIC PANEL
Anion Gap: 7 (ref 7–16)
Calcium, Total: 8.2 mg/dL — ABNORMAL LOW (ref 8.5–10.1)
Chloride: 107 mmol/L (ref 98–107)
Co2: 28 mmol/L (ref 21–32)
Creatinine: 0.39 mg/dL — ABNORMAL LOW (ref 0.60–1.30)
EGFR (African American): >60
EGFR (Non-African Amer.): >60
Glucose: 91 mg/dL (ref 65–99)
POTASSIUM: 3.2 mmol/L — AB (ref 3.5–5.1)
SODIUM: 142 mmol/L (ref 136–145)

## 2014-04-20 LAB — POTASSIUM: POTASSIUM: 3.6 mmol/L (ref 3.5–5.1)

## 2014-04-20 LAB — MAGNESIUM: Magnesium: 1.7 mg/dL — ABNORMAL LOW

## 2014-04-23 LAB — CBC WITH DIFFERENTIAL/PLATELET
Basophil #: 0.1 10*3/uL (ref 0.0–0.1)
Basophil %: 1.3 %
EOS PCT: 3.1 %
Eosinophil #: 0.3 10*3/uL (ref 0.0–0.7)
HCT: 41.2 % (ref 35.0–47.0)
HGB: 13.6 g/dL (ref 12.0–16.0)
LYMPHS PCT: 35.8 %
Lymphocyte #: 3.3 10*3/uL (ref 1.0–3.6)
MCH: 28.2 pg (ref 26.0–34.0)
MCHC: 33.1 g/dL (ref 32.0–36.0)
MCV: 85 fL (ref 80–100)
MONO ABS: 0.6 x10 3/mm (ref 0.2–0.9)
MONOS PCT: 6.3 %
NEUTROS PCT: 53.5 %
Neutrophil #: 4.9 10*3/uL (ref 1.4–6.5)
Platelet: 221 10*3/uL (ref 150–440)
RBC: 4.84 10*6/uL (ref 3.80–5.20)
RDW: 13.7 % (ref 11.5–14.5)
WBC: 9.2 10*3/uL (ref 3.6–11.0)

## 2014-04-23 LAB — URINALYSIS, COMPLETE
BACTERIA: NONE SEEN
BLOOD: NEGATIVE
Bilirubin,UR: NEGATIVE
Glucose,UR: >=500 mg/dL (ref 0–75)
Hyaline Cast: 1
Ketone: NEGATIVE
NITRITE: NEGATIVE
Ph: 9 (ref 4.5–8.0)
RBC,UR: 13 /HPF (ref 0–5)
SPECIFIC GRAVITY: 1.02 (ref 1.003–1.030)

## 2014-04-23 LAB — TROPONIN I

## 2014-04-23 LAB — COMPREHENSIVE METABOLIC PANEL
ANION GAP: 10 (ref 7–16)
AST: 13 U/L — AB (ref 15–37)
Albumin: 3.7 g/dL (ref 3.4–5.0)
Alkaline Phosphatase: 45 U/L — ABNORMAL LOW (ref 46–116)
BUN: 5 mg/dL — ABNORMAL LOW (ref 7–18)
Bilirubin,Total: 0.1 mg/dL — ABNORMAL LOW (ref 0.2–1.0)
CALCIUM: 9.2 mg/dL (ref 8.5–10.1)
Chloride: 104 mmol/L (ref 98–107)
Co2: 25 mmol/L (ref 21–32)
Creatinine: 0.76 mg/dL (ref 0.60–1.30)
EGFR (Non-African Amer.): >60
Glucose: 245 mg/dL — ABNORMAL HIGH (ref 65–99)
Osmolality: 283 (ref 275–301)
POTASSIUM: 3.4 mmol/L — AB (ref 3.5–5.1)
SGPT (ALT): 12 U/L — ABNORMAL LOW (ref 14–63)
Sodium: 139 mmol/L (ref 136–145)
TOTAL PROTEIN: 8 g/dL (ref 6.4–8.2)

## 2014-04-24 ENCOUNTER — Observation Stay: Payer: Self-pay | Admitting: Internal Medicine

## 2014-04-24 LAB — BASIC METABOLIC PANEL
ANION GAP: 7 (ref 7–16)
BUN: 5 mg/dL — ABNORMAL LOW (ref 7–18)
CHLORIDE: 106 mmol/L (ref 98–107)
Calcium, Total: 8.9 mg/dL (ref 8.5–10.1)
Co2: 24 mmol/L (ref 21–32)
Creatinine: 0.62 mg/dL (ref 0.60–1.30)
EGFR (Non-African Amer.): >60
GLUCOSE: 268 mg/dL — AB (ref 65–99)
Osmolality: 280 (ref 275–301)
Potassium: 3.8 mmol/L (ref 3.5–5.1)
SODIUM: 137 mmol/L (ref 136–145)

## 2014-04-25 LAB — DRUG SCREEN, URINE
AMPHETAMINES, UR SCREEN: NEGATIVE (ref ?–1000)
Barbiturates, Ur Screen: NEGATIVE (ref ?–200)
Benzodiazepine, Ur Scrn: NEGATIVE (ref ?–200)
CANNABINOID 50 NG, UR ~~LOC~~: POSITIVE (ref ?–50)
COCAINE METABOLITE, UR ~~LOC~~: NEGATIVE (ref ?–300)
MDMA (ECSTASY) UR SCREEN: NEGATIVE (ref ?–500)
Methadone, Ur Screen: NEGATIVE (ref ?–300)
OPIATE, UR SCREEN: POSITIVE (ref ?–300)
PHENCYCLIDINE (PCP) UR S: NEGATIVE (ref ?–25)
Tricyclic, Ur Screen: NEGATIVE (ref ?–1000)

## 2014-05-04 ENCOUNTER — Emergency Department: Payer: Self-pay | Admitting: Emergency Medicine

## 2014-06-13 ENCOUNTER — Emergency Department: Payer: Self-pay | Admitting: Emergency Medicine

## 2014-06-15 ENCOUNTER — Ambulatory Visit: Admit: 2014-06-15 | Disposition: A | Discharge status: Home / Self Care | Payer: Self-pay | Admitting: Neurology

## 2014-06-16 ENCOUNTER — Inpatient Hospital Stay: Payer: Self-pay | Admitting: Internal Medicine

## 2014-06-16 LAB — COMPREHENSIVE METABOLIC PANEL
ALBUMIN: 4 g/dL
Alkaline Phosphatase: 38 U/L
Anion Gap: 9 (ref 7–16)
BILIRUBIN TOTAL: 0.8 mg/dL
BUN: 7 mg/dL
Calcium, Total: 8.6 mg/dL — ABNORMAL LOW
Chloride: 102 mmol/L
Co2: 25 mmol/L
Creatinine: 0.38 mg/dL — ABNORMAL LOW
Glucose: 81 mg/dL
Potassium: 2.9 mmol/L — ABNORMAL LOW
SGOT(AST): 14 U/L — ABNORMAL LOW
SGPT (ALT): 10 U/L — ABNORMAL LOW
SODIUM: 136 mmol/L
Total Protein: 7.1 g/dL

## 2014-06-17 LAB — BASIC METABOLIC PANEL
Anion Gap: 5 — ABNORMAL LOW (ref 7–16)
BUN: 6 mg/dL
Calcium, Total: 8.4 mg/dL — ABNORMAL LOW
Chloride: 109 mmol/L
Co2: 25 mmol/L
Creatinine: 0.38 mg/dL — ABNORMAL LOW
EGFR (African American): >60
Glucose: 125 mg/dL — ABNORMAL HIGH
POTASSIUM: 3 mmol/L — AB
Sodium: 139 mmol/L

## 2014-06-17 LAB — MAGNESIUM: Magnesium: 1.7 mg/dL

## 2014-06-18 LAB — CBC WITH DIFFERENTIAL/PLATELET
BASOS PCT: 1.2 %
Basophil #: 0.1 10*3/uL (ref 0.0–0.1)
Eosinophil #: 0.1 10*3/uL (ref 0.0–0.7)
Eosinophil %: 1.5 %
HCT: 37.3 % (ref 35.0–47.0)
HGB: 12.2 g/dL (ref 12.0–16.0)
LYMPHS PCT: 45.3 %
Lymphocyte #: 2.9 10*3/uL (ref 1.0–3.6)
MCH: 28 pg (ref 26.0–34.0)
MCHC: 32.7 g/dL (ref 32.0–36.0)
MCV: 86 fL (ref 80–100)
MONO ABS: 0.6 x10 3/mm (ref 0.2–0.9)
Monocyte %: 9.1 %
NEUTROS PCT: 42.9 %
Neutrophil #: 2.7 10*3/uL (ref 1.4–6.5)
Platelet: 170 10*3/uL (ref 150–440)
RBC: 4.36 10*6/uL (ref 3.80–5.20)
RDW: 13.7 % (ref 11.5–14.5)
WBC: 6.3 10*3/uL (ref 3.6–11.0)

## 2014-06-18 LAB — BASIC METABOLIC PANEL
ANION GAP: 7 (ref 7–16)
BUN: 6 mg/dL
Calcium, Total: 8.8 mg/dL — ABNORMAL LOW
Chloride: 106 mmol/L
Co2: 25 mmol/L
Creatinine: 0.42 mg/dL — ABNORMAL LOW
EGFR (African American): >60
EGFR (Non-African Amer.): >60
Glucose: 117 mg/dL — ABNORMAL HIGH
Potassium: 3.4 mmol/L — ABNORMAL LOW
SODIUM: 138 mmol/L

## 2014-06-18 LAB — URINE CULTURE

## 2014-07-15 NOTE — H&P (Signed)
PATIENT NAME:  Rachael Gray, Rachael Gray MR#:  [id number] DATE OF BIRTH:  01/15/1981  DATE OF ADMISSION:  09/01/2012  Addendum. This is a continuation.    PAST MEDICAL HISTORY:  1. Insulin-dependent diabetes, uncontrolled. Today's hemoglobin was 12.4. Last level was 11.4.  2. History of seizure disorder.   PAST SURGICAL HISTORY:  1. C-section.  2. Laparotomy.  3. Right ovary.   ALLERGIES: DOXYCYCLINE AND TYLENOL.   HOME MEDICATIONS:  1. Lantus 30 units daily and 15 units at bedtime if her blood sugar is elevated.  2. Keppra 500 mg oral p.o. b.i.d.   SOCIAL HISTORY: The patient smokes 1/2 pack to 1 pack per day. No alcohol. No IV drug abuse. Tried to counsel about quitting. She is not interested in that at all.   FAMILY HISTORY: Positive for cancer and diabetes.   REVIEW OF SYSTEMS:  CONSTITUTIONAL: The patient denies fever, chills. Complains of fatigue, weakness.  EYES: Denies blurry vision, double vision, inflammation, glaucoma.  ENT: Denies tinnitus, ear pain, hearing loss or epistaxis.  RESPIRATORY: Denies cough, wheezing, hemoptysis, painful respiration or COPD.   CARDIOVASCULAR: Denies chest pain, orthopnea, edema, palpitation or syncope.  GASTROINTESTINAL: Complains of nausea, vomiting, abdominal pain. Denies any coffee-ground emesis, any bright red blood per rectum, any melena, any diarrhea or constipation.  GENITOURINARY: Denies dysuria, hematuria, renal colic.  ENDOCRINE: Denies polyuria, polydipsia, heat or cold intolerance.  HEMATOLOGIC: Denies anemia, easy bruising, bleeding diathesis.  INTEGUMENTARY: Denies acne, rash, skin lesions.  MUSCULOSKELETAL: Denies gout, arthritis, cramps, limited activity.  NEUROLOGIC: Denies numbness, weakness, dysarthria, CVA, TIA.   PSYCHIATRIC: Denies anxiety, insomnia, substance abuse, alcohol abuse, bipolar disorder or schizophrenia.   PHYSICAL EXAMINATION:  VITAL SIGNS: Temperature 98.5, pulse 78, respiratory rate 18, blood pressure  145/78, saturating 99% on room air.  GENERAL: Well-nourished female, looks comfortable in bed, in no apparent stress.  HEENT: Head atraumatic, normocephalic. Pupils equal, reactive to light. Pink conjunctivae. Anicteric sclerae. Moist oral mucosa.  NECK: Supple. No thyromegaly. No JVD.  CHEST: Good air entry bilaterally. No wheezing, rales, rhonchi.  CARDIOVASCULAR: S1, S2 heard. No rubs, murmurs, gallops.  ABDOMEN: Tender to palpation in the epigastric area but no rebound, no guarding, nondistended. Bowel sounds present. No organomegaly.  EXTREMITIES: No edema. No clubbing. No cyanosis. Dorsalis pedis pulse +2 bilaterally.  SKIN: Normal skin turgor. Warm and dry.  PSYCHIATRIC: Appropriate affect. Awake, alert x3. Intact judgment and insight.  NEUROLOGIC: Cranial nerves grossly intact. No motor or sensory deficits.   PERTINENT LABS: Glucose 271, BUN 9, creatinine 0.63, sodium 139, potassium 3.8, chloride 108. Hemoglobin A1c 12.6. Calcium 9.3. ALT 11, AST 9, alk phos 65. White blood cells 21.6, hemoglobin 13.1, hematocrit 40, platelets 249.   ASSESSMENT AND PLAN:  1. Nausea, vomiting, abdominal pain: The patient had esophagogastroduodenoscopy done last time which did show gastric fundus erythema with bilious gastric contents, so this is most likely due to gastritis versus gastroparesis. Will start the patient on intravenous Protonix 40 mg b.i.d. The patient was never officially diagnosed with gastroparesis as she never had gastric emptying study done, so we will schedule her for gastric emptying study in the morning. Will manage with intravenous pain and nausea medicine. At this point, will continue with Phenergan and Zofran. Will try to avoid Reglan for her gastric emptying study in the morning. As well, will not start her on erythromycin until the study is done. If she has any delays in gastric emptying, will add Reglan and erythromycin to her current regimen. Will   have her on clear liquid diet and  advance as tolerated.  2. Leukocytosis: The patient is afebrile. This is most likely reactive. Will check urinalysis. The patient's CT abdomen and pelvis is pending. She denies any cough or any productive sputum.  3. Diabetes mellitus, poorly controlled: Will resume the patient on Lantus and insulin sliding scale.  4. Tobacco abuse: The patient was counseled. She is not interested at all in quitting. As well, she does not want any NicoDerm patches.  5. History of seizures: The patient will be resumed on her Keppra.  6. Deep vein thrombosis prophylaxis: Subcutaneous heparin.  7. Gastrointestinal prophylaxis: On intravenous Protonix.   CODE STATUS: FULL CODE.   TOTAL TIME SPENT ON ADMISSION AND PATIENT CARE: 55 minutes.   ____________________________ Albertine Patricia, MD dse:gb D: 09/01/2012 01:18:06 ET T: 09/01/2012 01:56:28 ET JOB#: [id number]  cc: Albertine Patricia, MD, <Dictator> Janyah Singleterry Graciela Husbands MD ELECTRONICALLY SIGNED 09/02/2012 0:41

## 2014-07-15 NOTE — Consult Note (Signed)
PATIENT NAME:  Rachael, Gray MR#:  [id number] DATE OF BIRTH:  01/25/1981  DATE OF CONSULTATION:  09/01/2012  REFERRING PHYSICIAN:  Vivien Presto, MD CONSULTING PHYSICIAN:  Manya Silvas, MD/Anterrio Mccleery A. Jerelene Gray, ANP (Adult Nurse Practitioner)  REASON FOR CONSULTATION: Gastroparesis.   HISTORY OF PRESENT ILLNESS: This pleasant African American female with history of type 1 diabetes, seizures disorder, gastritis and gastroparesis presents to the Emergency Room with acute onset of nausea, vomiting and diffuse abdominal pain, started Sunday. The patient reports she ate a hamburger with chili and chips at 11:00 and within 15 minutes developed severe nausea to the point where she had to leave the church picnic.Marland Kitchen She had repeated vomiting about every 25 minutes all day long. First emesis was greenish to yellow and then she thinks she passed some black emesis at the end. She had an episode of brown diarrhea. No bloating, but positive diffuse abdominal discomfort described as sharp and dull, rated 10/10. The patient reports this event is identical to what she has experienced in the past.    The patient does have a history of recurrent abdominal pain, and she used to take oxycodone which she has not had recently. The patient knows to avoid NSAIDs. She did take a Migraine Excedrin prior to this event. Previous luminal upper endoscopy studies have been performed 3 times for the same type of presentation, including 2011 with findings of small hiatus hernia, gastritis; 2012 with erosive gastritis; and most recently 04/20/2012 with EGD showing bilious fluid found in the gastric fundus, localized moderate erythematous mucosa with stigmata of recent bleeding found in the gastric fundus and normal esophagus, normal examined duodenum, with pathology showing negative H. pylori, negative dysplasia and malignancy, and mild to moderate chronic gastritis. No previous colonoscopy. A CT of the abdomen and pelvis with  contrast performed today showed a small area of decreased enhancement in the right kidney, decreased from prior. The patient had admission and pyelonephritis earlier this year. Dr. Vira Agar saw the patient in consultation in January for similar symptoms and recommended IV Reglan and improve diabetes control. The patient reports that she gets her care through a [facility] urgent care walk-in clinic. She reports Medicaid  just started and she resumed her medications in January. She reports taking pantoprazole daily.   PAST MEDICAL HISTORY: 1.  Insulin-dependent diabetes, diagnosed age 75, uncontrolled, hemoglobin A1c 12.4, previously at 11.4.  2.  History of seizure disorder.  3.  Nausea, vomiting, abdominal pain. Recurrent events with gastritis/erosive gastritis per recent EGD 04/20/2012.  4.  Gastroparesis.   PAST SURGICAL HISTORY: 1.  C-section.  2.  Laparotomy.  3.  Right oophorectomy. 4.  Cholecystectomy.   HOME MEDICATIONS:  1.  Lantus 30 units in the a.m.  2.  Lantus 15 units at bedtime.  3.  Levitra 40 mg daily.  4.  Keppra 500 mg twice daily. The patient says she is off that medication, but she brings in the pill bottle.  5.  Zofran/ondansetron 4 mg for nausea, vomiting.  6.  Pantoprazole 40 mg daily.  7.  Excedrin Migraine, rare use.  8.  Previously used oxycodone, discontinued 03/2012.   ALLERGIES: DOXYCYCLINE AND TYLENOL.   FAMILY HISTORY: Grandmother with breast cancer. Negative for colon cancer, colon polyps.   HABITS: Positive tobacco, 1 pack per day. Denies alcohol or illicit drug use.   SOCIAL HISTORY: The patient is single. Has 2 daughters, 58 year old and 43-year-old. Lives with mother. 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 She has been unemployed since. The patient reports she is applying for disability.   REVIEW OF SYSTEMS: Ten systems reviewed.  CONSTITUTIONAL: Positive for fever (99.5), some chills, fatigue, weakness.  GASTROINTESTINAL: GI  history with nausea, vomiting, abdominal pain. Reports coffee-ground emesis after repeated vomiting. Denies bright red blood or melena. Denies change in bowel habits and has a tendency towards very slight constipation at rare intervals.  GENITOURINARY: She does have urinary frequency, urgency. No burning, dysuria or hematuria.  NEUROLOGIC: Reports last seizure was yesterday and it was a short one lasting 10 to 20 minutes, witnessed by her mother. The patient does not know what type it was and she says she was incontinent of urine. PSYCHIATRIC: Positive for depression and a lot of situational stress and was referred to psychiatry as outpatient. Her appointment was today and she had to miss it for this hospitalization.   Remaining systems negative.   PHYSICAL EXAMINATION: VITAL SIGNS: 98.5, 104, 18, 121/72, 98% on room air.  GENERAL: Well-nourished, young [name] female presents in NAD. The patient is resting in bed. She is conversive.  HEENT: Head is normocephalic. Conjunctivae are a little injected. Sclerae are anicteric. Oral mucosa is dry and intact.  NECK: Supple. Trachea midline.  HEART: Heart tones S1, S2. Positive systolic murmur, may be flow murmur. No gallop or click.  LUNGS: CTA. Respirations are eupneic anterior and posterior.  ABDOMEN: Soft. The patient tenses. When she is asked to relax, abdomen remains soft, nondistended. She reports generalized tenderness wherever pressed. No HSM or masses.  RECTAL: Digital rectal exam with formed, dry, brown, heme-negative stool present. No internal masses.  EXTREMITIES: Lower extremities without edema, cyanosis or clubbing.  SKIN: Warm and dry without rash.  PSYCHIATRIC: The patient becomes tearful when talking about her situation, and starts to cry when talking about depression. She states she had a terrible childhood and has some issues she wants to talk to a psychiatrist about.  NEUROLOGIC: Cranial nerves grossly intact. Follows  commands and movement in extremities x 4. Gait not evaluated.   LABORATORY DATA: Admission blood work as noted. Glucose today 233. Beta-hCG negative. Hemoglobin A1c 12.6. Electrolytes unremarkable. Liver portion unremarkable. WBC 21.6. Urinalysis positive for ketones, blood, protein. CT of the abdomen without any acute abdominal findings. Small and large bowel normal in caliber. Appendix not definitely identified, however, no inflammatory changes in right lower quadrant.   IMPRESSION:  1.  A patient with recurrent episodes of severe nausea followed by repetitive vomiting, diffuse abdominal pain. This has been investigated in the past with findings of gastritis and erosive gastritis. She has had bilious gastric fluid to suggest a gastroparesis. This is a reasonable assumption give her insulin-dependence and uncontrolled diabetes.  2.  History of cholecystectomy.  3.  Leukocytosis, may be from repetitive vomiting. The patient reports low-grade fever at 99.5, some chills. Afebrile during admission.  4.  Generalized abdominal pain is chronic, tenderness is nonspecific, and the patient requests Dilaudid as it works much better for her than the morphine. Pain is at time sharp to dull.  5.  Guaiac-negative.  6.  The patient reports depression, becomes tearful, reports she had outpatient appointment  with psychiatry today. She reports difficult for her to access health care and states her Medicaid just came in January.   PLAN:  1.  Increase Protonix to 40 mg b.i.d. Begin Reglan 5 mg IV 4 times daily. See if that helps her stomach empty. There is a gastric emptying  study arranged for tomorrow. Hold Reglan in the am until that test is pursued. Elevated A1c could cause poor gastric emptying with uncontrolled diabetes. Gastric emptying usually improves when glucose  management improves.  2.  No recommendation for repeat luminal evaluation since EGD  was just performed in January as noted.  3.  Hemoccult negative  today, no evidence of active GI bleed with normal hemoglobin. No lower bowel complaints. 4.  Expect leukocytosis to improve. Will check serial CBCs daily.  5.  Psychiatric consult to assist with her depression and tearfulness and perhaps with compliance.  6.  This case was discussed with Dr. Vira Agar in collaboration of care. Further recommendations pending results.   These services provided by Rachael Millin A. Jerelene Redden, MS, APRN, BC, ANP, under collaborative agreement with Gaylyn Cheers, MD.   Thank you for this consultation.    ____________________________ Rachael Harder Jerelene Gray, ANP (Adult Nurse Practitioner) kam:jm D: 09/01/2012 17:21:00 ET T: 09/01/2012 20:22:55 ET JOB#: [id number]  cc: Rachael Gray, ANP (Adult Nurse Practitioner), <Dictator> Rachael Harder Sherlyn Hay, MSN, ANP-BC Adult Nurse Practitioner ELECTRONICALLY SIGNED 09/02/2012 11:59

## 2014-07-15 NOTE — H&P (Signed)
PATIENT NAME:  Rachael Gray, Rachael Gray MR#:  [id number] DATE OF BIRTH:  03-Jan-1981  DATE OF ADMISSION:  02/28/2013  REFERRING PHYSICIAN:  Dr. Lurline Hare.  PRIMARY CARE PHYSICIAN:  Following at the [facility] urgent care center.   CHIEF COMPLAINT:  Intractable nausea, vomiting and abdominal pain.   HISTORY OF PRESENT ILLNESS:  This is a 34 year old female with history of chronic abdominal pain, diabetes mellitus, seizures, the patient has multiple admissions in the past for similar presentation.  Most recent in June of this year, where she had a gastric emptying study done which was negative where she was discharged with a diagnosis of viral gastroenteritis, the patient as well she had an upper GI endoscopy in the past where she was found to have evidence of gastric fundus mucosa erythema and bilious content in the stomach, the patient reports she has been having abdominal pain, nausea and vomiting for the last two days, reports she has been symptom-free over the last five months since her last discharge in June of this year, reports symptoms started before two days, reports where she had vomiting more than 5 times today, she had one episode in the ED, the patient received a few doses of Zofran and Dilaudid where she still felt nauseous after that so hospitalist requested to admit the patient, the patient had CT abdomen and pelvis with IV contrast which was negative, did not show any acute findings, the patient did not have any leukocytosis, her blood glucose was uncontrolled at 347, but she was not in DKA with normal anion gap, lipase was mildly elevated at 464, the patient had a temperature 100.9 in the ED.  Urinalysis was negative.  Blood cultures were sent.  Denies any cough or any productive sputum.   PAST MEDICAL HISTORY: 1.  Insulin-dependent diabetes.  2.  History of seizure disorder.   PAST SURGICAL HISTORY: 1.  C-section.  2.  Laparotomy.   ALLERGIES:  DOXYCYCLINE AND TYLENOL AND ASPIRIN.   HOME  MEDICATIONS: 1.  Lantus 30 units in the morning, 15 at the evening.  2.  Keppra 500 mg oral 2 times a day.   SOCIAL HISTORY:  The patient smokes 1 pack per day.  No alcohol.  No illicit drug use.   FAMILY HISTORY:  Significant for cancer and diabetes in the family.   REVIEW OF SYSTEMS:   The patient denies any fever, chills, even though she had a temperature 100.9 in the ED.  Denies any weight gain, weight loss.  Complains of fatigue and weakness.  EYES:  Denies blurry vision, double vision, inflammation, glaucoma.  EARS, NOSE, THROAT:  Denies tinnitus, ear pain, hearing loss, epistaxis.  RESPIRATORY:  Denies cough, wheezing, hemoptysis, painful respiration or COPD.  CARDIOVASCULAR:  Denies chest pain, orthopnea, edema, palpitations or syncope.  GASTROINTESTINAL:  Had complaints of nausea and vomiting and abdominal pain for the last two days, but she denies any coffee-ground emesis, any bright red blood per rectum, any melena, any diarrhea, any constipation.  ENDOCRINE:  Denies polyuria, polydipsia, heat or cold intolerance.  Reports history of diabetes. GENITOURINARY:  Denies any dysuria, hematuria, or renal colic.  HEMATOLOGIC:  Denies anemia, easy bruising, bleeding diathesis.  INTEGUMENTARY:  Denies acne, rash or skin lesions.  MUSCULOSKELETAL:  Denies any arthritis, cramps.  NEUROLOGIC:  Denies any numbness, weakness, dysarthria, CVA, TIA.  Reports history of seizures in the past.  PSYCHIATRIC:  Denies anxiety, insomnia, substance abuse, alcohol abuse, bipolar disorder or schizophrenia.   PHYSICAL EXAMINATION: VITAL SIGNS:  Temperature 98.1, repeat 100.9, pulse 107, respiratory rate 18, blood pressure 137/79, saturating 98% on room air.  GENERAL:  Well-nourished female who looks comfortable in bed, in no apparent distress.  HEENT:  Head atraumatic, normocephalic.  Pupils equal, reactive to light.  Pink conjunctivae.  Anicteric sclerae.  Moist oral mucosa.  NECK:  Supple.  No  thyromegaly.  No JVD.  CHEST:  Good air entry bilaterally.  No wheezing, rales, rhonchi.  CARDIOVASCULAR:  S1, S2 heard.  No rubs, murmurs or gallops.  ABDOMEN:  Mild tender to palpation in the epigastric area, but no rebound, no guarding.  Bowel sounds present in all four quadrants.  No organomegaly, nondistended abdomen.  EXTREMITIES:  No edema.  No clubbing.  No cyanosis.  Dorsalis pedis pulse +2 bilaterally, radial pulses +2 bilaterally.  SKIN:  Normal skin turgor.  Warm and dry.  PSYCHIATRIC:  Appropriate affect.  Awake, alert x 3.  Intact judgment and insight.  NEUROLOGIC:  Cranial nerves grossly intact.  Motor 5 out of 5.  No focal deficits.  LYMPHATICS:  No cervical or supraclavicular lymphadenopathy.   PERTINENT LABORATORY DATA:  Glucose 347, BUN 10, creatinine 0.92, sodium 136, potassium 3.5, chloride 102, CO2 28, ALT 14, AST 15, alk phos 67.  Troponin less than 0.02.  White blood cells 9.3, hemoglobin 14.1, hematocrit 42.4, platelets 203.  Urinalysis negative for leukocyte esterase and nitrite.   IMAGING STUDIES:  CT abdomen and pelvis showing no acute abnormalities within the abdomen or pelvis.   ASSESSMENT AND PLAN: 1.  Nausea, vomiting and abdominal pain, the patient had multiple presentations in the past with a similar episode, she had negative study for gastroparesis in June of this year, so this is most likely related to gastritis episode, most likely viral, especially with her low-grade temperature, at this point we will send blood cultures and we will continue with aggressive hydration, we will keep her on as needed pain and nausea medicine, as well, she had history of gastritis in the past so we will start her on IV Protonix twice daily, as well she had mildly elevated lipase.  This is most likely related to her nausea and vomiting.  We will repeat the level in a.m.  2.  Diabetes mellitus, uncontrolled.  We will resume the patient on Lantus, but we will have her on a lower dose as  her by mouth intake is unpredictable.  We will have her on 10 twice a day and we will cover with insulin sliding scale as well.  We will keep her on liquid diet.  3.  Tobacco abuse.  The patient was counseled and she will be started on nicotine patches.  4.  History of seizure.  She will be continued on Keppra.  5.  Deep vein thrombosis prophylaxis.  SubQ heparin.  6.  Gastrointestinal prophylaxis.  The patient is on Protonix.  7.  CODE STATUS:  FULL CODE.   Total time spent on admission and patient care 55 minutes.    ____________________________ Albertine Patricia, MD dse:ea D: 02/28/2013 01:31:47 ET T: 02/28/2013 02:17:48 ET JOB#: [id number]  cc: Albertine Patricia, MD, <Dictator> DAWOOD Graciela Husbands MD ELECTRONICALLY SIGNED 03/01/2013 4:07

## 2014-07-15 NOTE — Consult Note (Signed)
Reason for Consult:  Admit Date: 14-Apr-2012   Chief Complaint: malaise   Reason for Consult: seizure   History of Present Illness:  History of Present Illness:   34 yo RHD F presents with generalized maliase and noted to have an urinary tract infection.  She then had a seizure in the ER.  Per pt, she has had seizures since age of 89 and has had multiple episodes.  She initially saw a neurologist 7 years ago but none since on the same dose of dilantin and has had multiple seizures but they have been more controlled lately.  She had an EEG in the past.  ROS:   General fatigue    HEENT no complaints    Lungs no complaints    Cardiac no complaints    GI no complaints    GU burning with urination    Musculoskeletal no complaints    Extremities no complaints    Skin no complaints    Neuro seizure    Endocrine no complaints    Psych no complaints   Past Medical/Surgical Hx:  migraines:   Seizures:   Lupus:   diabetes:   Epilepsy:   Endometriosis:   Diabetes Mellitus, Type II (NIDD):   Cesarean Section:   Laparoscopy:   Past Medical/ Surgical Hx:   Past Medical History as above    Past Surgical History as above   Home Medications: Medication Instructions Last Modified Date/Time  Dilantin 100 mg oral capsule, extended release 1 cap(s) orally 2 times a day 21-Jan-14 09:25   Allergies:  Tylenol: Rash  Tylenol: Rash  Doxycycline: Rash  Allergies:   Allergies as above   Social/Family History:  Employment Status: disabled   Lives With: family   Living Arrangements: apartment   Social History: rare Etoh, no tob, no illicits   Family History: no seizures   Vital Signs: **Vital Signs.:   21-Jan-14 09:47   Vital Signs Type Admission   Temperature Temperature (F) 97.9   Celsius 36.6   Temperature Source Oral   Pulse Pulse 91   Respirations Respirations 20   Systolic BP Systolic BP 536   Diastolic BP (mmHg) Diastolic BP (mmHg) 68   Mean BP 81   Pulse  Ox % Pulse Ox % 98   Pulse Ox Activity Level  At rest   Oxygen Delivery Room Air/ 21 %   Physical Exam:  General: NAD, resting comfortably, nl weight   HEENT: normocephalic, sclera nonicteric, oropharynx clear   Neck: supple, no JVD, no bruits   Chest: CTA B, no wheezing, good movement   Cardiac: RRR, no murmurs, no edema, 2+ pulses   Extremities: no C/C/E, FROM   Neurologic Exam:  Mental Status: alert and oriented x 3, normal speech and language, follows complex commands   Cranial Nerves: PERRLA, EOMI, nl VF, face symmetric, tongue midline, shoulder shrug equal   Motor Exam: 5/5 B normal, tone, no tremor   Deep Tendon Reflexes: 2+/4 B, plantars downgoing B, no Hoffman   Sensory Exam: pinprick, temperature, and vibration intact B   Coordination: FTN and HTS WNL, nl RAM, nl gait   Lab Results: TDMs:  21-Jan-14 05:15    Dilantin, Serum 14.5 (Result(s) reported on 14 Apr 2012 at 05:59AM.)  Routine Chem:  21-Jan-14 05:15    Glucose, Serum  255   BUN  4   Creatinine (comp)  0.52   Sodium, Serum 136   Potassium, Serum  3.2   Chloride, Serum 104  CO2, Serum  17   Calcium (Total), Serum 8.6   Anion Gap 15   Osmolality (calc) 278   eGFR (African American) >60   eGFR (Non-African American) >60 (eGFR values <65m/min/1.73 m2 may be an indication of chronic kidney disease (CKD). Calculated eGFR is useful in patients with stable renal function. The eGFR calculation will not be reliable in acutely ill patients when serum creatinine is changing rapidly. It is not useful in  patients on dialysis. The eGFR calculation may not be applicable to patients at the low and high extremes of body sizes, pregnant women, and vegetarians.)  Routine UA:  21-Jan-14 05:33    Color (UA) Yellow   Clarity (UA) Hazy   Glucose (UA) >=500   Bilirubin (UA) Negative   Ketones (UA) 2+   Specific Gravity (UA) 1.022   Blood (UA) 3+   pH (UA) 6.0   Protein (UA) 30 mg/dL   Nitrite (UA) Negative    Leukocyte Esterase (UA) Trace (Result(s) reported on 14 Apr 2012 at 06:25AM.)   RBC (UA) 81 /HPF   WBC (UA) 33 /HPF   Bacteria (UA) NONE SEEN   Epithelial Cells (UA) 3 /HPF   Mucous (UA) PRESENT (Result(s) reported on 14 Apr 2012 at 06:25AM.)  Routine Hem:  21-Jan-14 05:15    WBC (CBC) 10.6   RBC (CBC) 4.14   Hemoglobin (CBC) 12.1   Hematocrit (CBC) 35.2   Platelet Count (CBC)  140   MCV 85   MCH 29.2   MCHC 34.4   RDW 13.1   Neutrophil % 80.9   Lymphocyte % 9.3   Monocyte % 9.4   Eosinophil % 0.0   Basophil % 0.4   Neutrophil #  8.6   Lymphocyte # 1.0   Monocyte #  1.0   Eosinophil # 0.0   Basophil # 0.0 (Result(s) reported on 14 Apr 2012 at 05:59AM.)   Impression/Recommendations:  Recommendations:   CT of abdomen report reviewed by me  and shows pyelonephritis reviewed by me and show UTI notes reviewed by me    Epilepsy-  given the time period in which her seizures developed 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 Pyelonephritis-  treated d/c dilantin continue Keppra [id number] no driving or operating heavy machinery x 6 months would not use flouroquinolones to treat infection because they lower seizure threshold treat infection will sign off, [facility]  Electronic Signatures: SmJamison NeighborMD)  (Signed 21-Jan-14 14:12)  Authored: Consult, History of Present Illness, Review of Systems, PAST MEDICAL/SURGICAL HISTORY, HOME MEDICATIONS, ALLERGIES, Social/Family History, NURSING VITAL SIGNS, Physical Exam-, LAB RESULTS, Recommendations   Last Updated: 21-Jan-14 14:12 by SmJamison NeighborMD)

## 2014-07-15 NOTE — Consult Note (Signed)
Brief Consult Note: Diagnosis: abdominal pain, NV.   Patient was seen by consultant.   Consult note dictated.   Comments: Appreciate consult for 34 y/o [name] woman for evaluation of abdominal pain, NVD. onset 2d ago: unsure what started it. No known sick contacts. States her pain is in the upper abdomen on both sides, and food worsens. Food also triggers NV. Reports a black stool last Wed, and a dark greenish stool last Fri, which was her last bm.  States her pain is better on Dilaudid, and the Phenergan is helpful in controlling the NV. Feeling some better today, but has had several episodes of vomiting- last was a reddish color, but also noted patient has had several red ice pops. No coffee ground emesis at home. Last hgb 11.2, lipase slightly elevated. Had EGD 1/14 with bile gastritis. At some point had a positive gastric delay, but in chart review this was repeated over the summer while hospitlalized for similar complaints, and there was no gastroparesis- however was discharged on Reglan 5mg  po qid and PPI. States this was helpful, but her medicaid expired and she has been out for quite some time. Denies further GI complaints. Mild upper abdominal tenderness on exam: abd is soft. On rectal exam stool is faintly brown, heme negative. Impression and plan: Elevated lipase: repeat.  Epigastric pain, NV. Questionable melena, heme negative today. Has been off PPI and has diabetes, which in itself increases gastric acid producion. She is  Hemodynamically stable. Agree with PPI bid. Will plan for EGD when clinically feasible..  Electronic Signatures: Stephens November H (NP)  (Signed 08-Dec-14 17:20)  Authored: Brief Consult Note   Last Updated: 08-Dec-14 17:20 by Theodore Demark (NP)

## 2014-07-15 NOTE — Discharge Summary (Signed)
PATIENT NAME:  Rachael Gray, REGINO MR#:  [id number] DATE OF BIRTH:  October 26, 1980  DATE OF ADMISSION:  04/18/2012 DATE OF DISCHARGE:  04/23/2012  DISCHARGE DIAGNOSES:  1. Nausea, vomiting, now resolved, status post EGD showing some bile in the stomach with area of erythema in the fundus likely source of hematemesis, now tolerating diet fine, and the pain is well controlled.  2. Hypokalemia/hypomagnesemia, repleted and resolved.   SECONDARY DIAGNOSES:  1. Diabetes.  2. A history of seizure disorder.   CONSULTANTS: GI:  Dr. Vira Agar.  PROCEDURES/RADIOLOGY: 1. EGD on January 27th showed some bile in the stomach with area of erythema in the fundus which was thought to be likely source of hematemesis. Biopsies were taken for H. pylori.  2. KUB on January 24th showed no findings to suggest bowel obstruction or ileus.   MAJOR LABORATORY PANEL: Urinalysis on admission was negative. Urine pregnancy test was negative. Gastric biopsy showed mild-to-moderate chronic gastritis, no H. pylori, negative for dysplasia and malignancy.   HISTORY AND SHORT HOSPITAL COURSE: The patient is a 34 year old female with the above-mentioned medical problems who was admitted for nausea and vomiting. Please see Dr. Gardiner Coins dictated history and physical for further details. She also has a history of seizure for which she was restarted on Keppra. A GI consultation was obtained with Dr. Vira Agar who recommended EGD which was performed January 27th with the results dictated above. The patient was started on Reglan, Protonix and her antiseizure medication, was slowly improving, tolerated her diet okay, and her pain was fairly well controlled. She was close to her baseline on January 30th and was discharged home in stable condition.     PHYSICAL EXAMINATION: VITAL SIGNS: On the date of discharge her vital signs are as follows: Temperature 98.5, heart rate 91 per minute, respirations 18 per minute, blood pressure 112/74 mmHg. She was  saturating 97% on room air.   PERTINENT PHYSICAL EXAMINATION ON THE DATE OF DISCHARGE:  CARDIOVASCULAR: S1, S2 normal. No murmurs, rubs, or gallop.  LUNGS: Clear to auscultation bilaterally. No wheezing, rales, rhonchi, or crepitation.  ABDOMEN: Soft, benign.  NEUROLOGIC: Nonfocal examination. All other physical examination remained at the baseline.   DISCHARGE MEDICATIONS:  1. Insulin Lantus 12 units subcutaneous at bedtime.  2. Oxycodone 5 mg p.o. every 4 hours as needed.  3. Keppra 500 mg p.o. b.i.d.  4. Protonix 40 mg p.o. daily.  5. Zofran 4 mg p.o. every 6 hours as needed.  6. Ketorolac 10 mg p.o. 4 times a day as needed.  7. Promethazine 25 mg p.o. every 8 hours as needed.   DISCHARGE DIET: Regular.  DISCHARGE ACTIVITY: As tolerated.   DISCHARGE INSTRUCTIONS AND FOLLOW-UP:  The patient was instructed follow-up with her primary care physician at [facility] in 1 to 2 weeks. She will need follow-up with Dr. Vira Agar in 2 to 4 weeks. She was also recommended to follow-up at [facility] Neurology doctor in 4 to 6 weeks.   TOTAL TIME DISCHARGING THIS PATIENT: 55 minutes.    ____________________________ Lucina Mellow. Manuella Ghazi, MD vss:jm D: 04/24/2012 10:26:11 ET T: 04/24/2012 12:52:29 ET JOB#: [id number]  cc: Babbette Dalesandro S. Manuella Ghazi, MD, <Dictator> Manya Silvas, MD [facility] Neurology [facility] Lucina Mellow [facility] MD ELECTRONICALLY SIGNED 04/25/2012 11:20

## 2014-07-15 NOTE — H&P (Signed)
PATIENT NAME:  Rachael Gray, Rachael Gray MR#:  [id number] DATE OF BIRTH:  [birth year]  DATE OF ADMISSION:  08/31/2012  PRIMARY CARE PHYSICIAN: Following at [facility].    REFERRING PHYSICIAN: Marjean Donna.   CHIEF COMPLAINT: Intractable nausea, vomiting, abdominal pain.   HISTORY OF PRESENT ILLNESS: This is a 34 year old female with history of chronic abdominal pain, diabetes mellitus, seizures who has had multiple admissions in the past for similar presentation in January of this year. One episode, she had acute pyelonephritis. Another episode where she did require upper GI endoscopy, where she was found to have evidence of gastric fundus mucosa erythema and bilious contents in her stomach. The patient reports she was never officially diagnosed with gastroparesis as she never had gastric emptying study done in the past. The patient reports she has been having abdominal pain, nausea, vomiting over the last day. Denies any fever or chills. Denies diarrhea, loss of appetite, chest pain or back pain. No relieving or provoking symptoms. The patient's labs were significant for elevated white blood cell count at 20,000. The patient's lipase was within normal limits. She denies any alcohol abuse. The patient's hemoglobin A1c was at 12.6 which is an elevation from her previous hemoglobin A1c which was at 11 in the past.   The patient received multiple doses of IV Dilaudid and IV medication for her nausea without much improvement, so hospitalist service was requested to admit the patient for further management and treatment of her symptoms.     ____________________________ Albertine Patricia, MD dse:gb D: 09/01/2012 01:09:26 ET T: 09/01/2012 01:46:59 ET JOB#: [id number]  cc: Albertine Patricia, MD, <Dictator> Haliyah Fryman Graciela Husbands MD ELECTRONICALLY SIGNED 09/02/2012 0:41

## 2014-07-15 NOTE — H&P (Signed)
PATIENT NAME:  Rachael Gray, Rachael Gray MR#:  [id number] DATE OF BIRTH:  [birth year]  DATE OF ADMISSION:  04/17/2012  PRIMARY CARE PHYSICIAN:  None.   CHIEF COMPLAINT:  Intractable nausea, vomiting, seizures.   HISTORY OF PRESENT ILLNESS:  A 34 year old female with chronic abdominal pain, who was just discharged on January 22, who initially presented with nausea, vomiting and seizures, was diagnosed with right-sided pyelonephritis by CT scan secondary to E. coli and also seizures, and was discharged on Keppra. After discharge, the patient did not fill any of her medications. She actually came into the ER this morning for intractable nausea and vomiting. She was given Phenergan and sent home. Her aunt was not satisfied, so they were on their way to [facility] for further evaluation, when she stopped at [place] and apparently had 2 seizures. The EMS was called, and the patient was brought here where she has had no further seizures; however, she is complaining of her chronic abdominal pain and nausea and vomiting.   REVIEW OF SYSTEMS:  CONSTITUTIONAL:  No fever. Positive fatigue and weakness.  EYES:  No blurred or double vision, glaucoma or cataracts.   EAR, NOSE, THROAT:  No ear pain, hearing loss or seasonal allergies.  RESPIRATORY:  No cough, wheezing, hemoptysis.  CARDIOVASCULAR:  No chest pain, palpitations, orthopnea, edema, arrhythmia or dyspnea on exertion.  GASTROINTESTINAL:  Positive nausea and vomiting. No diarrhea. No melena or ulcers.  GENITOURINARY:  No dysuria or hematuria.  ENDOCRINE:  No polyuria or polydipsia.  HEMATOLOGIC:  No anemia or easy bruising.  SKIN:  No rash or lesions.  MUSCULOSKELETAL:  No limited activity.  NEUROLOGIC:  No history of CVA or TIA.  She has a history of seizure disorder.  PSYCHIATRIC:  No anxiety or depression.   PAST MEDICAL HISTORY:   1.  Insulin-dependent diabetes. The patient's last hemoglobin A1c was 11.4. She is not compliant with medications. She  says she cannot afford medications, so she does not take any of her medications.  2. History of seizure disorder, recently changed from Dilantin to [place] during her last hospitalization.   PAST SURGICAL HISTORY:  1.  C-section.  2.  Laparotomy.  3.  Right ovary.   ALLERGIES: DOXYCYCLINE AND TYLENOL.   MEDICATIONS:  The patient was discharged on:  1.  Lantus 12 units at bedtime.  2.  Cefuroxime 500 mg b.i.d. for 10 days.  3.  Oxycodone 5 mg q.4 hours p.r.n. pain.  4.  Keppra 500 mg b.i.d.   5.  Protonix 40 mg daily.  6.  Zofran ODT 4 mg q.i.d. p.r.n. nausea and vomiting.   SOCIAL HISTORY:  The patient smokes 1 pack a day. No alcohol or IV drug use. She is not interested at all in quitting.   FAMILY HISTORY:  Positive for cancer and diabetes.   PHYSICAL EXAMINATION: VITAL SIGNS:  Temperature 98.1, pulse is 95, respirations 18, blood pressure 145/63, 98% on room air.  GENERAL:  The patient is alert, in mild distress.  HEENT: Head is atraumatic. Pupils are round and reactive. Sclerae anicteric. Mucous membranes are moist. There is no tongue biting noted.  Sclerae anicteric.    NECK:  Supple without JVD, carotid bruit or enlarged thyroid.  CARDIOVASCULAR:  Regular rate and rhythm. There are no murmurs, gallops or rubs. PMI is not displaced.   LUNGS:  Clear to auscultation bilaterally without crackles, rales, rhonchi or wheezing. Normal percussion.  EXTREMITIES:  No clubbing, cyanosis or edema.  NEUROLOGIC:  Cranial  nerves II through XII are intact. Babinski sign is downgoing. Finger-to-nose is intact. Her reflexes are 2+ bilaterally and symmetrically. Her strength is a 4+ bilaterally and symmetrically.   LABORATORY DATA:   Sodium 140, potassium 3.3, chloride 109, bicarb 19, BUN less than 1, creatinine 0.33, glucose 174. Calcium 7.8, bilirubin 0.3, alk phos 161, ALT 41, AST 27, total protein 7.2, albumin 2.8. White blood cells 5.9, hemoglobin 11, hematocrit 33.2, platelets are 195. I have  ordered a KUB, which is pending.   ASSESSMENT AND PLAN: A 34 year old female with chronic abdominal pain with possible gastroparesis. She presents with intractable nausea and vomiting. She also had 2 seizures this afternoon and learned with recent diagnosis of pyelonephritis.  1.  Intractable nausea and vomiting. I suspect this is possibly related to her uncontrolled diabetes. Her last hemoglobin A1c was over 11 during her last hospitalization. I did discuss with this patient that she probably would have less frequent attacks of this nausea and vomiting  if her blood sugars were better controlled; however, she states that she cannot afford any medications. She will need a gastroparesis study at some point as an outpatient. For now, we will treat her nausea and vomiting with supportive care. I have ordered a KUB just to make sure she does have any other acute pathology, although her abdominal exam is completely benign.  2.  Seizures. The patient apparently had 2 seizures today. She did not pick up her prescription of Keppra, which is probably the reason why she had her seizures. She was loaded in the ER. We will  continue Keppra 500 b.i.d., and continue neuro checks and seizure precautions.  3.  Hypokalemia. This has been replaced in the ER.  4.  Uncontrolled diabetes. I have stressed the importance of trying to obtain her medications and any having an ADA diet. The patient seems very reluctant to make any changes right now. We will need case management at discharge. I will continue her on her outpatient medications that she was discharged with.  5.  Tobacco dependence. The patient is adamant about not quitting smoking. She has no desire to. The patient was counseled for 3 minutes. The patient would like a nicotine patch.  6.  The patient is FULL CODE STATUS. She will need a PCP and case manager prior to discharge.    TIME SPENT: Approximately 40 minutes.   ____________________________ Donell Beers. Benjie Karvonen,  MD spm:dm D: 04/17/2012 19:40:00 ET T: 04/17/2012 21:09:39 ET JOB#: [id number]  cc: Kip Cropp P. Benjie Karvonen, MD, <Dictator> Kahleb Mcclane P Rudolf Blizard MD ELECTRONICALLY SIGNED 04/19/2012 22:03

## 2014-07-15 NOTE — Consult Note (Signed)
Chief Complaint:  Subjective/Chief Complaint mild nausea today, 2 episodes of emesis since early this am.  Patient states abdominal pain is improved, overall feeling better than the past few days. no bm   VITAL SIGNS/ANCILLARY NOTES: **Vital Signs.:   10-Dec-14 20:12  Vital Signs Type Routine  Temperature Temperature (F) 98.7  Celsius 37  Temperature Source oral  Respirations Respirations 18  Systolic BP Systolic BP 981  Diastolic BP (mmHg) Diastolic BP (mmHg) 77  Mean BP 100  Pulse Ox % Pulse Ox % 97  Pulse Ox Activity Level  At rest  Oxygen Delivery Room Air/ 21 %  *Intake and Output.:   Daily 10-Dec-14 07:00  Emesis ml     Out:  250    13:30  Emesis ml     Out:  1    Shift 15:00  Emesis ml     Out:  1    18:10  Emesis ml     Out:  1    Shift 23:00  Emesis ml     Out:  1   Brief Assessment:  Cardiac Regular   Respiratory clear BS   Gastrointestinal details normal Soft  Nondistended  mild tenderness topalpation in the epigastrum, bowel sounds positive.   Lab Results: General Ref:  [id number] 95:62   Helicobacter pylori AB. IgG, IgA, IgM ========== TEST NAME ==========  ========= RESULTS =========  = REFERENCE RANGE =  HELICOBACTER P. AB PANEL  H pylori, IgM, IgG, IgA Ab H. pylori, IgG Abs              [   Result Pending       ]                   H. pylori, IgA Abs[   Result Pending       ]                   H. pylori, IgM Abs              [   <9.0 units           ]           0.0-8.9                                                Negative          <9.0                                                Equivocal   9.0 - 11.0                                                Positive         >11.0                                                                      .  This test was developed and its performance                characteristics determined by [facility]. It has not been                cleared or approved by the Food and Drug Administration.                Results of this test are for investigational purposes                only. The result should not be used as a diagnostic              procedure without confirmation of the diagnosis by                another medically diagnostic product or procedure.               [facility] [place]            No: [id number]           [address], [place], [place] [address]         Lindon Romp, MD         [phone]   Result(s) reported on 03 Mar 2013 at 12:47PM.  Routine Chem:  10-Dec-14 05:15   Glucose, Serum 90  BUN  2  Creatinine (comp)  0.40  Sodium, Serum 137  Potassium, Serum  3.4  Chloride, Serum 107  CO2, Serum 27  Calcium (Total), Serum 8.9  Anion Gap  3  Osmolality (calc) 270  eGFR (African American) >60  eGFR (Non-African American) >60 (eGFR values <47m/min/1.73 m2 may be an indication of chronic kidney disease (CKD). Calculated eGFR is useful in patients with stable renal function. The eGFR calculation will not be reliable in acutely ill patients when serum creatinine is changing rapidly. It is not useful in  patients on dialysis. The eGFR calculation may not be applicable to patients at the low and high extremes of body sizes, pregnant women, and vegetarians.)  Magnesium, Serum  1.6 (1.8-2.4 THERAPEUTIC RANGE: 4-7 mg/dL TOXIC: > 10 mg/dL  -----------------------)   Assessment/Plan:  Assessment/Plan:  Assessment 1) nausea vomiting abdominal pain.  improving.  currently on ppi, atc zofran, metoclopromide prn, was started on po erythromycin,  2) IDDM-history of poor compliance and poor control. 3) gastritis-appearance of mechanical gastritis, secondary to recurrent emesis   Plan 1) continue current, would change reglan from prn to 5 mg po ac and at bedtime.  Erythromycin, though a good short term help will not be an efficacious outpatient medicine.  Helicobacter pylori serology result pending.  I encouraged limiting use of pain meds as this may be  counterproductive re poor gastric emptying. I have also encouraged good outpatient followup with care provider and compliance with medicines.  (note normal gastric emptying study last summer) 2) patietn states she has not been seeing a regular putpatient physician and has been going to urgent care.  I have suggested to her that follow up could be done at [facility] for followup with better continuity of care  I willnot be available through the weekend, and will ask one of my associates to follow.   Electronic Signatures: SLoistine Simas(MD)  (Signed 10-Dec-14 21:49)  Authored: Chief Complaint, VITAL SIGNS/ANCILLARY NOTES, Brief Assessment, Lab Results, Assessment/Plan   Last Updated: 10-Dec-14 21:49 by SLoistine Simas(MD)

## 2014-07-15 NOTE — Consult Note (Signed)
PATIENT NAME:  Rachael Gray, Rachael Gray MR#:  [id number] DATE OF BIRTH:  11/11/1980  DATE OF CONSULTATION:  04/19/2012  CONSULTING PHYSICIAN:  Manya Silvas, MD  REASON FOR CONSULTATION: The patient is a 34 year old black female with chronic abdominal pain. She has problems with epigastric pain, pain across the upper abdomen, sometimes with vomiting and vomiting makes the pain worse. She has vomited black material in the last week or so. The pain is not relieved by vomiting. She was admitted, and I was asked to see her in consultation.   She had an upper endoscopy done here in 2009, and she also was diagnosed with gastroparesis at that time.   The patient was discharged recently on January 22nd.  She initially presented with nausea, vomiting and seizures, was diagnosed with a right-sided pyelonephritis by CAT scan with E. coli. She was discharged on Keppra, however, she was unable to get her medicines filled. Her Medicaid has expired, and she found out last year that her Medicaid was no longer applicable, and she was going through the process of getting it reinstated. It has recently been reinstated, but she has been unable to get any medicines yet.   PAST MEDICAL HISTORY:  1. History of insulin-dependent diabetes since age 55.  2. Seizure disorder, medicine changed from Dilantin to [place] during her last hospitalization.  3. Gastroparesis diagnosed in 2009.   PAST SURGICAL HISTORY: 1. Gallbladder removal.  2. Cesarean section.  3. Right ovary removal.   ALLERGIES: DOXYCYCLINE AND TYLENOL.   MEDICATIONS: Lantus 12 units at bedtime, oxycodone 5 mg every 4 hours p.r.n. for pain, Keppra 500 mg p.o. b.i.d., Protonix 40 mg a day,  Zofran oral dissolving tablet q.i.d. p.r.n.   The patient says that she has used Reglan in the past, and it seemed to help her nausea better than Zofran.  SOCIAL HISTORY: She smokes a pack a day. She does not drink alcohol. She does not use IV drugs. She has two  daughters.   FAMILY HISTORY: Positive for cancer and diabetes. Diabetes in both grandparents.   REVIEW OF SYSTEMS: No constipation. No diarrhea. No melena. She does definitely have hematemesis of black material. Her previous hemoglobin A1c was 11.4.   PHYSICAL EXAMINATION: GENERAL: Black female in no acute distress, appropriate and good historian.  HEENT: Sclerae anicteric. Conjunctivae negative.  HEAD: Atraumatic. Tongue shows some coating in it suggesting yeast infection.  NECK: Trachea in the midline.  CHEST: Clear.  HEART: No murmurs or gallops I can hear.  There is an occasional skipped beat, but the rhythm overall is  regular.  ABDOMEN: Bowel sounds are present, somewhat diminished. No hepatosplenomegaly. No masses. No bruits. There is definite epigastric tenderness and tenderness across the upper abdomen to palpation. No hepatosplenomegaly.  EXTREMITIES: No edema.  SKIN: Warm and dry.  PSYCH: Mood and affect are appropriate.   LABORATORY DATA: Sodium 140, potassium 3.3, chloride 109, bicarbonate 19, creatinine 0.33, BUN 1, glucose 174, calcium 7.8, total bilirubin 0.3, alkaline phosphatase 161, ALT 41, AST 27, total protein 7.2, albumin 2.8.  White count 5.9, hemoglobin 11, platelet count 195.   ASSESSMENT: Given her upper abdominal pain and hematemesis of black material in someone not taking iron or Pepto-Bismol, likely she has either gastritis with bleeding and possible ulcer disease with bleeding. She does have a history of gastroparesis. She does take an occasional Aleve for headaches. This is fairly rarely. She is on Depo-Provera shots for birth control.   PLAN: I am going to  start her on Reglan 5 mg IV every 6 hours, schedule her for an upper endoscopy for tomorrow with propofol anesthesia. This is felt to be needed because of her      seizure disorder to have their assistance. She has gotten Keppra since she has been here this admission. I will follow with you.    ____________________________ Manya Silvas, MD rte:cb D: 04/19/2012 17:02:05 ET T: 04/19/2012 17:14:53 ET JOB#: [id number]  cc: Manya Silvas, MD, <Dictator> Vipul S. Manuella Ghazi, MD Sital P. Benjie Karvonen, MD Manya Silvas MD ELECTRONICALLY SIGNED 05/14/2012 7:54

## 2014-07-15 NOTE — Consult Note (Signed)
Chief Complaint:  Subjective/Chief Complaint Please see full GI consult and brief consult note.  Patietn seen and examined, chart reviewed.  Patient admitted with n/v.  History of IDDM, and possible gastroparesis.  Of note, last gastric empyting study June '14 normal.  Patient reports some hematemesis when current began several days ago.  Of not patietn also reports that she has alot of problems with chronic constipation and is not taking anything to help her with a bm.  Of note when patient was on reglan (she ran out a coupld months ago)  her bm were also better.   N/V improved from admission but no bm since then.  REcommend continue bid ppi, add miralax, recheck lipase in am.  Will plan foe egd tomorrow as clinically feasible.  I have discussed the risks benefits and complicatiosn of egd to include not limited to bleeding infection perforation and sedation and she wishes to proceed.   VITAL SIGNS/ANCILLARY NOTES: **Vital Signs.:   08-Dec-14 14:09  Vital Signs Type Routine  Temperature Temperature (F) 98.1  Celsius 36.7  Temperature Source oral  Pulse Pulse 87  Respirations Respirations 18  Systolic BP Systolic BP 300  Diastolic BP (mmHg) Diastolic BP (mmHg) 81  Mean BP 94  Pulse Ox % Pulse Ox % 97  Pulse Ox Activity Level  At rest  Oxygen Delivery Room Air/ 21 %   Brief Assessment:  Cardiac Regular   Respiratory clear BS   Gastrointestinal details normal Soft  Nondistended  Bowel sounds normal  No rebound tenderness  mild epigastric tenderness to palpation.   Lab Results: Routine Chem:  08-Dec-14 04:57   Lipase 226 (Result(s) reported on 01 Mar 2013 at 06:41AM.)  Glucose, Serum  151  BUN  4  Creatinine (comp)  0.34  Sodium, Serum 139  Potassium, Serum  3.0  Chloride, Serum 106  CO2, Serum 24  Calcium (Total), Serum  8.2  Anion Gap 9  Osmolality (calc) 277  eGFR (African American) >60  eGFR (Non-African American) >60 (eGFR values <75m/min/1.73 m2 may be an indication of  chronic kidney disease (CKD). Calculated eGFR is useful in patients with stable renal function. The eGFR calculation will not be reliable in acutely ill patients when serum creatinine is changing rapidly. It is not useful in  patients on dialysis. The eGFR calculation may not be applicable to patients at the low and high extremes of body sizes, pregnant women, and vegetarians.)  Magnesium, Serum  1.5 (1.8-2.4 THERAPEUTIC RANGE: 4-7 mg/dL TOXIC: > 10 mg/dL  -----------------------)  Hemoglobin A1c ([facility])  12.2 (The American Diabetes Association recommends that a primary goal of therapy should be <7% and that physicians should reevaluate the treatment regimen in patients with HbA1c values consistently >8%.)  Routine Hem:  08-Dec-14 04:57   WBC (CBC) 8.3  RBC (CBC) 4.10  Hemoglobin (CBC)  11.7  Hematocrit (CBC)  34.5  Platelet Count (CBC) 181  MCV 84  MCH 28.5  MCHC 33.9  RDW 12.8  Neutrophil % 59.1  Lymphocyte % 35.1  Monocyte % 4.7  Eosinophil % 0.8  Basophil % 0.3  Neutrophil # 4.9  Lymphocyte # 2.9  Monocyte # 0.4  Eosinophil # 0.1  Basophil # 0.0 (Result(s) reported on 01 Mar 2013 at 06:56AM.)   Radiology Results: XRay:    06-Dec-14 23:00, Abdomen Complete 3 or more views of abd  Abdomen Complete 3 or more views of abd   REASON FOR EXAM:    abd pain, n/v  COMMENTS:  PROCEDURE: DXR - DXR ABDOMEN COMPLETE  - Feb 27 2013 11:00PM     CLINICAL DATA:  Epigastric pain.    EXAM:  ACUTE ABDOMEN SERIES (2 VIEW ABDOMEN AND 1 VIEW CHEST)    COMPARISON:  04/12/2009 04/17/2012    FINDINGS:  Chest radiograph demonstrates clear lungs. Heart and mediastinum are  normal. No evidence for free air. Stable surgical clips in the right  upper abdomen. Small amount of gas within the colon. Bony structures  are intact. No large abdominal calcifications.     IMPRESSION:  No acute chest findings.    Nonspecific bowel gas pattern.      Electronically Signed    By: Markus Daft M.D.    On: 02/27/2013 23:24         Verified By: Burman Riis, M.D.,  CT:    07-Dec-14 00:16, CT Abdomen and Pelvis With Contrast  CT Abdomen and Pelvis With Contrast   REASON FOR EXAM:    (1) B/L lower quadrant tenderness, n/v; (2) B/L lower   quadrant tenderness, n/v  COMMENTS:       PROCEDURE: CT  - CT ABDOMEN / PELVIS  W  - Feb 28 2013 12:16AM     CLINICAL DATA:  Epigastric pain.    EXAM:  CT ABDOMEN AND PELVIS WITH CONTRAST    TECHNIQUE:  Multidetector CT imaging of the abdomen and pelvis was performed  using the standard protocol following bolus administration of  intravenous contrast.  CONTRAST:  100 mL Isovue-300    COMPARISON:  11/22/2011 and 04/13/2012    FINDINGS:  Subtle ground-glass density at the posterior left lung base on  sequence 4, image 8 appears chronic and may represent small scar.  Otherwise, the lung bases are clear. No evidence for free air.    Gallbladder is been removed. No gross abnormality to the liver or  portal venous system. Normal appearance of the pancreas, spleen and  adrenal glands. Normal appearance of both kidneys. No significant  free fluid or lymphadenopathy. No gross abnormality to the uterus or  left ovary. Rightovary is not confidently visualized. No gross  abnormality to the small or large bowel. No acute bone abnormality.   IMPRESSION:  No acute abnormalities within the abdomen or pelvis.      Electronically Signed    By: Markus Daft M.D.    On: 12/07/201401:01         Verified By: Burman Riis, M.D.,   Assessment/Plan:  Assessment/Plan:  Plan as above.   Electronic Signatures: Loistine Simas (MD)  (Signed 08-Dec-14 21:05)  Authored: Chief Complaint, VITAL SIGNS/ANCILLARY NOTES, Brief Assessment, Lab Results, Radiology Results, Assessment/Plan   Last Updated: 08-Dec-14 21:05 by Loistine Simas (MD)

## 2014-07-15 NOTE — H&P (Signed)
PATIENT NAME:  Rachael Gray, Rachael Gray MR#:  [id number] DATE OF BIRTH:  07/10/1980  DATE OF ADMISSION:  04/14/2012  PRIMARY CARE PHYSICIAN: None.   CHIEF COMPLAINT: Seizures and abdominal pain.   HISTORY OF PRESENT ILLNESS: This is a 34 year old female patient with history of pyelonephritis, seizures and insulin-dependent diabetes mellitus who was initially in the Emergency Room yesterday diagnosed with right-sided pyelonephritis and UTI and returned to the Emergency Room today, early morning, with complaints of 5 minutes of generalized seizures. The patient presently feels back to baseline after receiving pain medications. She does have chronic lower abdominal and back pain and shoulder pain which seems to be worse, as per the patient. The patient was placed on ciprofloxacin and sent home. Her Dilantin level is therapeutic at 14.5. She did receive 1000 mg of Cerebyx in the Emergency Room.   The patient mentions that the last time she had UTI was 2 years prior. Her seizures have been well controlled. She has not had any recent change in medications.   No rash, fever or chills. Complains of fatigue.   PAST MEDICAL HISTORY: 1. Insulin-dependent diabetes mellitus. 2. Seizure disorder. 3. Endometriosis. 4. Pyelonephritis.   PAST SURGICAL HISTORY: 1. Cesarean section. 2. Laparotomy. 3. Right ovary removal.   ALLERGIES: DOXYCYCLINE AND TYLENOL.   HOME MEDICATIONS: 1. Dilantin extended-release 300 mg at bedtime.  2. Dilantin 200 mg oral 2 times a day.  3. Zofran ODT 4 mg oral every 6 hours as needed.  4. Norco 5/325 mg 1 to 2 tablets oral every 4 hours as needed.   SOCIAL HISTORY: The patient smokes a pack a day, occasional alcohol, no illicit drugs. Lives at home with her mom.   FAMILY HISTORY: Significant for cancer and diabetes.   REVIEW OF SYSTEMS:  CONSTITUTIONAL: Complains of fatigue. No weight loss or weight gain.   EYES: No blurred vision, pain or redness.   ENT: No ear pain,  hearing loss or tinnitus.   RESPIRATORY: No cough, wheeze or hemoptysis.   CARDIOVASCULAR: No chest pain, orthopnea or edema.   GASTROINTESTINAL: Complains of nausea, vomiting and abdominal pain. No melena or diarrhea.   GENITOURINARY: No dysuria. Complains of flank pain. Incontinence with seizures.   ENDOCRINE: No polyuria, nocturia or thyroid problems. Has diabetes.   HEMATOLOGIC/LYMPHATIC: No anemia, easy bruising or bleeding.   INTEGUMENTARY: No acne, rash or lesions.   MUSCULOSKELETAL: Has chronic back pain, neck pain and shoulder pain.   NEUROLOGICAL: Had one episode of seizure lasting about 5 minutes, as per the patient, which was generalized with incontinence followed.   PSYCHIATRY: No anxiety or depression.   PHYSICAL EXAMINATION:  VITALS: Temperature is 98.3, pulse 113, respirations 20, blood pressure 110/64 and saturating 98% on room air.   GENERAL: Moderately built female patient lying in bed, comfortable, conversational, cooperative with exam.   PSYCHIATRIC: Alert and oriented x 3. Mood and affect appropriate. Judgment intact.   HEENT: Atraumatic, normocephalic. Oral mucosa dry and pink. No oral ulcers or thrush. No tongue bite.   NECK: Supple. No thyromegaly. No palpable lymph nodes. Trachea midline. No carotid bruit or JVD.   CARDIOVASCULAR: S1 and S2 tachycardic without any murmurs. Peripheral pulses 2+. No edema.   RESPIRATORY: Normal work of breathing. Clear to auscultation on both sides.   ABDOMEN: Soft abdomen. Tenderness in the lower and middle abdomen without rigidity or guarding. No hepatosplenomegaly palpable.   GENITOURINARY: No suprapubic tenderness. No bladder distention. No CVA tenderness.   SKIN: Warm and dry. No  petechiae, rash or ulcers.   MUSCULOSKELETAL: No joint swelling, redness or effusion of the large joints. Normal muscle tone.   NEUROLOGICAL: Motor strength 5 out of 5 in upper and lower extremities. Sensation to fine touch intact  all over. Cranial nerves II through XII intact.   LYMPHATIC: No cervical lymphadenopathy.   LABS/RADIOLOGIC STUDIES: Glucose 255, BUN 4, creatinine 0.52, sodium 136, potassium 3.2 and bicarbonate 17. Dilantin level 14.5. WBC 10.6, hemoglobin 12.1, platelets 140 and neutrophils 80.9.   Urinalysis shows 1880 WBCs with 3+ bacteria.   Pregnancy test is negative.   CT scan of the abdomen and pelvis with contrast shows findings concerning for pyelonephritis involving the right kidney.   ASSESSMENT AND PLAN: 1. Recurrent seizures with therapeutic Dilantin. I suspect her recurrent seizures are likely secondary from the ongoing urinary tract infection. The patient has been compliant with her medications and Dilantin level is therapeutic. She will not need any further imaging studies at this time. The patient will be on seizure precautions with p.r.n. Ativan for any further seizures. We will add a dose of Keppra, continue her Dilantin orally. We will consult neurology for further input with the case with the recurrent seizures.  2. Right-sided pyelonephritis. The patient was placed on ciprofloxacin in the Emergency Room. Fluoroquinolones can lower the threshold of seizures. We will change this to ceftriaxone, follow urine cultures and get blood cultures. Pain management with IV and oral pain medications.  3. Insulin-dependent diabetes mellitus. The patient was on Lantus in the past, but presently is not on any diabetic medications at home. With uncontrolled diabetes, we will check a HbA1c, put her on sliding scale insulin and restart on low-dose Lantus.  4. Hypokalemia secondary to vomiting and dehydration. Replace as needed. 5. DVT prophylaxis with heparin.   CODE STATUS: FULL CODE.   TIME SPENT: 60 minutes. Also the patient was counseled for greater than 3 minutes to quit smoking.  ____________________________ Leia Alf. Calvary Difranco, MD srs:sb D: 04/14/2012 07:59:54 ET    T: 04/14/2012 09:21:19 ET         JOB#: [id number] cc: Alveta Heimlich R. Chidera Dearcos, MD, <Dictator> Neita Carp MD ELECTRONICALLY SIGNED 04/30/2012 13:22

## 2014-07-15 NOTE — Consult Note (Signed)
CC: abd pain.  Pt gastric empty study was normal.  She was started on reglan yesterday, it was supposed to be one dose but was ordered for qid. She should be kept on this medicine 5mg  qid an hour before meals.  Abd with minimal tenderness today.  Would continue PPI and no need for repeat EGD at this time.  Can follow up in our office is she would like.  Dr. Candace Cruise to cover but will not see unless you call.  Electronic Signatures: Manya Silvas (MD)  (Signed on 11-Jun-14 16:34)  Authored  Last Updated: 11-Jun-14 16:34 by Manya Silvas (MD)

## 2014-07-15 NOTE — Consult Note (Signed)
Pt feels better and would like to have real food.  Bx showed gastritis.  Will try mechanical soft diet.  Electronic Signatures: Manya Silvas (MD)  (Signed on 28-Jan-14 17:23)  Authored  Last Updated: 28-Jan-14 17:23 by Manya Silvas (MD)

## 2014-07-15 NOTE — Consult Note (Signed)
Chief Complaint:  Subjective/Chief Complaint seen for nausea and vomiting.  feeling much better today, no nausea or vomiting, less epigastric pain.   VITAL SIGNS/ANCILLARY NOTES: **Vital Signs.:   09-Dec-14 04:47  Vital Signs Type Routine  Temperature Temperature (F) 98.1  Celsius 36.7  Temperature Source oral  Pulse Pulse 92  Respirations Respirations 18  Systolic BP Systolic BP 832  Diastolic BP (mmHg) Diastolic BP (mmHg) 60  Mean BP 77  Pulse Ox % Pulse Ox % 99  Pulse Ox Activity Level  At rest  Oxygen Delivery Room Air/ 21 %   Brief Assessment:  Cardiac Regular   Respiratory clear BS   Gastrointestinal details normal Soft  Nondistended  No masses palpable  Bowel sounds normal  mild epigastric tenderness to palpation.   Lab Results: Hepatic:  09-Dec-14 05:06   Bilirubin, Total 0.3  Alkaline Phosphatase 53 (45-117 NOTE: New Reference Range 02/12/13)  SGPT (ALT) 14  SGOT (AST)  13  Total Protein, Serum 6.5  Albumin, Serum  2.9  Routine Chem:  09-Dec-14 05:06   HCG Betasubunit Quant. Serum  < 1 (1-3  (International Unit)  ----------------- Non-pregnant <5 Weeks Post LMP mIU/mL  3- 4 wk 9 - 130  4- 5 wk 75 - 2,600  5- 6 wk 850 - 20,800  6- 7 wk 4,000 - 100,000  7-12 wk 11,500 - 289,000 12-16 wk 18,000 - 137,000 16-29 wk 1,400 - 53,000 29-41 wk 940 - 60,000)  Glucose, Serum 90  BUN  3  Creatinine (comp)  0.43  Sodium, Serum 139  Potassium, Serum  3.0  Chloride, Serum 107  CO2, Serum 26  Calcium (Total), Serum 8.6  Osmolality (calc) 274  eGFR (African American) >60  eGFR (Non-African American) >60 (eGFR values <27m/min/1.73 m2 may be an indication of chronic kidney disease (CKD). Calculated eGFR is useful in patients with stable renal function. The eGFR calculation will not be reliable in acutely ill patients when serum creatinine is changing rapidly. It is not useful in  patients on dialysis. The eGFR calculation may not be applicable to patients  at the low and high extremes of body sizes, pregnant women, and vegetarians.)  Anion Gap  6  Magnesium, Serum  1.4 (1.8-2.4 THERAPEUTIC RANGE: 4-7 mg/dL TOXIC: > 10 mg/dL  -----------------------)  Routine Hem:  09-Dec-14 05:06   WBC (CBC) 8.2  RBC (CBC) 4.23  Hemoglobin (CBC)  11.9  Hematocrit (CBC) 35.5  Platelet Count (CBC) 192  MCV 84  MCH 28.1  MCHC 33.5  RDW 12.9  Neutrophil % 52.4  Lymphocyte % 39.5  Monocyte % 6.5  Eosinophil % 0.6  Basophil % 1.0  Neutrophil # 4.3  Lymphocyte # 3.2  Monocyte # 0.5  Eosinophil # 0.1  Basophil # 0.1 (Result(s) reported on 02 Mar 2013 at 05:48AM.)   Radiology Results: XRay:    06-Dec-14 23:00, Abdomen Complete 3 or more views of abd  Abdomen Complete 3 or more views of abd   REASON FOR EXAM:    abd pain, n/v  COMMENTS:       PROCEDURE: DXR - DXR ABDOMEN COMPLETE  - Feb 27 2013 11:00PM     CLINICAL DATA:  Epigastric pain.    EXAM:  ACUTE ABDOMEN SERIES (2 VIEW ABDOMEN AND 1 VIEW CHEST)    COMPARISON:  04/12/2009 04/17/2012    FINDINGS:  Chest radiograph demonstrates clear lungs. Heart and mediastinum are  normal. No evidence for free air. Stable surgical clips in the right  upper abdomen. Small amount of gas within the colon. Bony structures  are intact. No large abdominal calcifications.     IMPRESSION:  No acute chest findings.    Nonspecific bowel gas pattern.      Electronically Signed    By: Markus Daft M.D.    On: 02/27/2013 23:24         Verified By: Burman Riis, M.D.,  CT:    07-Dec-14 00:16, CT Abdomen and Pelvis With Contrast  CT Abdomen and Pelvis With Contrast   REASON FOR EXAM:    (1) B/L lower quadrant tenderness, n/v; (2) B/L lower   quadrant tenderness, n/v  COMMENTS:       PROCEDURE: CT  - CT ABDOMEN / PELVIS  W  - Feb 28 2013 12:16AM     CLINICAL DATA:  Epigastric pain.    EXAM:  CT ABDOMEN AND PELVIS WITH CONTRAST    TECHNIQUE:  Multidetector CT imaging of the abdomen and pelvis  was performed  using the standard protocol following bolus administration of  intravenous contrast.  CONTRAST:  100 mL Isovue-300    COMPARISON:  11/22/2011 and 04/13/2012    FINDINGS:  Subtle ground-glass density at the posterior left lung base on  sequence 4, image 8 appears chronic and may represent small scar.  Otherwise, the lung bases are clear. No evidence for free air.    Gallbladder is been removed. No gross abnormality to the liver or  portal venous system. Normal appearance of the pancreas, spleen and  adrenal glands. Normal appearance of both kidneys. No significant  free fluid or lymphadenopathy. No gross abnormality to the uterus or  left ovary. Rightovary is not confidently visualized. No gross  abnormality to the small or large bowel. No acute bone abnormality.   IMPRESSION:  No acute abnormalities within the abdomen or pelvis.      Electronically Signed    By: Markus Daft M.D.    On: 12/07/201401:01         Verified By: Burman Riis, M.D.,   Assessment/Plan:  Assessment/Plan:  Assessment 1) epigastric pain , nausea vomiting, hematemesis-possible h/o gastroparesis.  feeling much better.   Plan 1) egd today.  I have discussed the risks beneftis and complications of egd to include not limited to bleeding infection perforation and sedation and she wishes to proceed.  further recs to follow.   Electronic Signatures: Loistine Simas (MD)  (Signed 09-Dec-14 12:44)  Authored: Chief Complaint, VITAL SIGNS/ANCILLARY NOTES, Brief Assessment, Lab Results, Radiology Results, Assessment/Plan   Last Updated: 09-Dec-14 12:44 by Loistine Simas (MD)

## 2014-07-15 NOTE — Discharge Summary (Signed)
PATIENT NAME:  Rachael Gray, SOUCY MR#:  [id number] DATE OF BIRTH:  1980-07-06  DATE OF ADMISSION:  02/28/2013 DATE OF DISCHARGE: 03/04/2013   CONSULTANTS: Dr. Gustavo Lah from GI.   PRIMARY CARE PHYSICIAN: The patient following at [facility].   CHIEF COMPLAINT: Nausea, vomiting and abdominal pain.   DISCHARGE DIAGNOSES:  1.  Suspected gastroparesis in the setting of poorly controlled diabetes.  2.  Gastritis secondary to repeated emesis.  3.  Uncontrolled diabetes.  4.  History of seizure disorder.  5.  Tobacco abuse.  6.  Hypokalemia.  7.  Hypomagnesemia.   DISCHARGE MEDICATIONS:   1.  Omeprazole 20 mg daily.  2.  Keppra 500 mg 2 times a day.  3.  Lantus 30 units in the morning and 15 units at bedtime.  4.  Metoclopramide 5 mg 4 times a day, 3 times with meals and once at bedtime.   DIET: Low-sodium, low-fat, low-cholesterol, ADA diet. A soft diet for now until he feels better and back to baseline.   FOLLOWUP: Please follow with PCP within 1 to 2 weeks. Please follow with GI within 1 to 2 weeks for your H. pylori test result.   SIGNIFICANT LABS AND IMAGING: Initial lipase was 464, mildly elevated. Hemoglobin A1c was 12.2. Initial creatinine was 0.9, BUN 10, lowest potassium was 3, lowest magnesium was 1.4. LFTs on arrival showed protein of 8.4, otherwise LFTs are within normal limits. Troponin was negative. Initial white count of 9.3, hemoglobin 14.1, platelets of 203. UA on arrival did not suggest infection. H. pylori is pending. CT of abdomen and pelvis with contrast showing no acute abnormalities within the abdomen or pelvis. Abdominal x-ray 3 or more complete showing no acute chest findings and nonspecific bowel gas pattern.  HISTORY OF PRESENT ILLNESS AND HOSPITAL COURSE: For full details of H and P, please the dictation on day of admission by Dr. Waldron Labs, but briefly this is a 34 year old with poorly controlled diabetes, history of seizure disorder with multiple  admissions for same, who came in for intractable nausea, vomiting and abdominal pain. Of note, the patient had a recent gastric emptying study in June, which was negative. Discharged with viral gastroenteritis. Of note, the patient also had a recent EGD showing gastric fundus mucosa erythema and bilious content in the stomach. She was admitted to the hospital for symptomatic relief and started on and some pain medications, IV fluid and Zofran and GI was consulted. The patient had a slow recovery. The patient required multiple doses of opiates. She was seen by Dr. Gustavo Lah from GI and underwent an EGD on December 09 showing gastritis, likely from multiple bouts of emesis. She had been on PPI. She was started on some erythromycin and at this point her symptoms have significantly improved. She is tolerating her full liquids and is eager to be discharged at this point. She will follow up with her PCP in regards to her insulin management and she is aware that her H. pylori is pending and she will follow with GI. At this point, she will be discharged to outpatient followup.   PHYSICAL EXAMINATION:  VITAL SIGNS: On the day of discharge, temperature is 98.2, pulse rate 85, respiratory rate 18, blood pressure 122/76, oxygen saturation 98% on room air.  GENERAL: The patient is a well-developed female lying in bed in no obvious distress.  HEENT: Normocephalic, atraumatic. Moist mucous membranes. HEART: Normal S1, S2 without significant murmurs. LUNGS: Clear to auscultation.  ABDOMEN: Soft, nontender. Hyperactive.  EXTREMITIES: Show no significant lower extremity edema.   TOTAL TIME SPENT: 35 minutes.   CODE STATUS: The patient is full code.  ____________________________ Vivien Presto, MD sa:aw D: 03/04/2013 11:57:30 ET T: 03/04/2013 12:34:55 ET JOB#: [id number]  cc: Vivien Presto, MD, <Dictator> Vivien Presto MD ELECTRONICALLY SIGNED 03/09/2013 11:08

## 2014-07-15 NOTE — Discharge Summary (Signed)
PATIENT NAME:  Rachael Gray, MCGLOCKLIN MR#:  [id number] DATE OF BIRTH:  1980-05-25  DATE OF ADMISSION:  04/14/2012 DATE OF DISCHARGE:  04/16/2012  DISCHARGE DIAGNOSES: 1. Right pyelonephritis.  2. Recurrent seizures.  3. Escherichia coli urinary tract infection.  4. Tobacco abuse.  5. Uncontrolled diabetes mellitus.   IMAGING STUDIES: CT of the abdomen and pelvis showed right-sided pyelonephritis, uncomplicated.   CONSULTS: Dr. Tamala Julian of Neurology.   ADMITTING HISTORY AND PHYSICAL: Please see detailed H and P dictated on 04/14/2012. In brief, the patient is a 34 year old female with history of pyelonephritis, seizures, insulin-dependent diabetes mellitus, presented to the Emergency Room with 2 episodes of recurrent seizures. The patient was previously diagnosed with right-sided pyelonephritis 1 day prior and was sent home on ciprofloxacin. The patient was admitted to the hospitalist service for further work-up and treatment.   HOSPITAL COURSE: 1. Right pyelonephritis:  The patient was started on ceftriaxone. Her ciprofloxacin was discontinued as it can lower the seizure threshold. The patient did well, did have 1 spike in temperature of 101.5 on the day of admission but afebrile after that. Her urine culture is growing E. coli. Blood cultures have been negative. The patient will be on antibiotic treatment for a total of 14 days. She has finished a 4-day course of antibiotic, and she will be on antibiotic pills for 10 more days.  2. Recurrent seizures: The patient was on Dilantin with therapeutic dose, started on Keppra. Dr. Tamala Julian, of Neurology, saw the patient. She is to be continued on the Warden and discontinue the Dilantin considering she has generalized seizures. The patient will follow up as outpatient with a neurologist for the seizures.  3. Uncontrolled diabetes mellitus:  The patient's HgA1c has been elevated at 11.4, but she was not taking any insulin. She has been started on Lantus 12 units,  and her blood sugars are much well controlled between 150 to 186 at this time.   On the day of discharge, the patient has blood pressure 133/79, pulse 100 and is being discharged home in fair condition.   DISCHARGE MEDICATIONS: 1. Cefuroxime 500 mg oral twice a day for 10 days.  2. Oxycodone 5 mg oral every 4 hours as needed for pain.  3. Keppra 500 mg oral twice a day.  4. Protonix 40 mg oral once a day.  5. Zofran ODT 4 mg oral 4 times a day as needed for nausea or vomiting.  6. Lantus 12 units subcutaneous once a day.   DISCHARGE INSTRUCTIONS: The patient has been given a glucometer and Accu-Strip prescription to check her blood sugars before meals and at bedtime. ADA diet. Activity as tolerated, and follow up with primary care physician within a week.   TIME SPENT: On the day of discharge in discharge activity was 35 minutes.  ____________________________ Leia Alf Avnoor Koury, MD srs:cb D: 04/16/2012 11:48:00 ET T: 04/16/2012 12:04:40 ET JOB#: [id number]  cc: Alveta Heimlich R. Cherith Tewell, MD, <Dictator> [facility] MD ELECTRONICALLY SIGNED 04/30/2012 13:22

## 2014-07-15 NOTE — Consult Note (Signed)
PATIENT NAME:  Rachael Gray, Rachael Gray MR#:  [id number] DATE OF BIRTH:  06-24-1980  DATE OF CONSULTATION:  03/01/2013  REFERRING PHYSICIAN:  Dr. Laurin Coder  CONSULTING PHYSICIAN:  Theodore Demark, NP  REASON FOR CONSULTATION: GI consult has been ordered by Dr. Laurin Coder to evaluate for persistent nausea and vomiting.   HISTORY OF PRESENT ILLNESS:  We appreciate this consult for a 34 year old [name] woman for evaluation of abdominal pain, nausea, vomiting, onset two days ago. Unsure what started it. No known sick contacts. States her pain is in the upper abdomen on both sides and food worsens it. Food also triggers nausea and vomiting. Reports a black stool last Wednesday and a dark, greenish stool last Friday, which was her last bowel movement. She states her pain is better on Dilaudid and Phenergan is helpful in controlling the nausea, vomiting. Feeling some better today, but has had several episodes of vomiting last with reddish color, but also noted the patient has had several red ice pops. No coffee-ground emesis at home. Last hemoglobin 11.2, lipase slightly elevated, this has normalized. Had EGD January 2014, has bile gastritis at some point had a positive gastric delay but in chart review this was repeated over the summer while hospitalized for similar complaints and there was no gastroparesis, it was a normal test. However, she was discharged on Reglan 5 mg 4 times a day and PPI. States this was helpful but her Medicaid expired. She has been out of both of these medications for quite some time. Denies further GI complaints.   HOME MEDICATIONS: Lantus 30 units in the morning 15 in the evening, Keppra 500 mg b.i.d.   SOCIAL HISTORY: Endorses 1 pack per day smoking history. No alcohol, illicits. Never had a blood transfusion.   FAMILY HISTORY: No history of colorectal cancer, liver disease, ulcers, colon polyps.   PAST MEDICAL HISTORY:  Insulin-dependent diabetes, seizure disorder,  C-section, laparotomy.   ALLERGIES: DOXYCYCLINE, TYLENOL, ASPIRIN.  LABORATORY, DIAGNOSTIC AND RADIOLOGIC DATA: Glucose 151, BUN 4, creatinine 0.34, sodium 139, potassium 3, chloride 106, GFR greater than 60, magnesium 1.5, lipase 226, hemoglobin A1c 12.2. WBC 8.3, hemoglobin 11.7, hematocrit 34.5, platelet count 181. Red cells are normocytic with normal RDW.   Contrasted CT of the abdomen and pelvis yesterday did not reveal any acute abnormalities within the abdomen or pelvis.   PHYSICAL EXAMINATION: VITAL SIGNS: Most recent vital signs: Temperature 98.1, pulse 87, respiratory rate 18, blood pressure 120/81, oxygen saturation 97% on room air.  GENERAL: Well-appearing woman resting in bed in no acute distress.  HEENT: Normocephalic, atraumatic. Sclerae are clear. Mucous membranes pink and moist.  NECK: Supple. No abnormalities.  CHEST: Respirations eupneic. Lungs are clear.  CARDIAC: S1, S2, RRR. No MRG. No peripheral edema.  ABDOMEN: Soft, flat, nondistended. Bowel sounds x 4. Mild left upper and right upper and epigastric quadrant tenderness. No guarding, rigidity, peritoneal signs, hepatosplenomegaly, masses or other abnormalities.  RECTAL: No apparent abnormalities. Stool light brownish heme negative. No internal abnormalities.  EXTREMITIES: MAEW x 4. Strength five out of five. No clubbing or cyanosis.  NEUROLOGICAL: Alert, oriented x 3. Cranial nerves II through XII intact. Speech clear. No facial droop.  PSYCHIATRIC: Pleasant, calm, cooperative somewhat limited understanding to health care behaviors.   IMPRESSION AND PLAN:  1.  Elevated lipase. This was repeated and normalized.  2.  Epigastric pain, nausea, and vomiting questionable melena heme negative today. She has been off proton pump inhibitor and has diabetes, which in itself increases gastric  acid production. She says she is not using any NSAIDs. She is hemodynamically stable. Agree with proton pump inhibitor twice a day. We will  plan for EGD when clinically feasible. Additionally have recommended to her that she find a regular primary physician as to assist with her diabetes control as her elevated A1c indicates that her diabetes is not well controlled. This may be contributing to her gastrointestinal symptoms.   Thank you very much for this consult. These services were provided by Stephens November, MSN, [facility], in collaboration with Loistine Simas, M.D. with whom I have discussed this patient in full.   ____________________________ Theodore Demark, NP chl:cc D: 03/01/2013 17:26:58 ET T: 03/01/2013 18:38:06 ET JOB#: [id number]  cc: Theodore Demark, NP, <Dictator> [place] SIGNED 03/03/2013 11:04

## 2014-07-15 NOTE — Consult Note (Signed)
EGD done for Dr. Vira Agar. No more vomiting. Less abd pain. EGD showed some bile in stomach. Area of erythema in fundus, likely source of hematemesis. Bx taken for H/H/H.pylori. Continue reglan. Add daily PPI. Resume diet and advance as tolerated. Thanks.  Electronic Signatures: Verdie Shire (MD)  (Signed on 27-Jan-14 11:55)  Authored  Last Updated: 27-Jan-14 11:55 by Verdie Shire (MD)

## 2014-07-15 NOTE — Discharge Summary (Signed)
PATIENT NAME:  Rachael Gray, Rachael Gray MR#:  [id number] DATE OF BIRTH:  1980/08/11  DATE OF ADMISSION:  09/01/2012 DATE OF DISCHARGE:  09/02/2012  CHIEF COMPLAINT: Nausea, vomiting, abdominal pain.  DISCHARGE DIAGNOSES: 1.  Nausea and vomiting likely secondary to viral gastritis.  2.  Uncontrolled diabetes secondary to noncompliance.  3.  Hypokalemia.  4.  Hypomagnesemia.  5.  History of seizure disorder.   DISCHARGE MEDICATIONS:  1.  Keppra 500 mg 2 times a day. 2.  Lantus 30 units in the morning and 15 units at night. 3.  Protonix 40 mg 2 times a day. 4.  Zofran ODT 4 mg 1 tab 3 times a day as needed for nausea and vomiting.   DIET: Low sodium, ADA diet.   ACTIVITY: As tolerated.   DISCHARGE INSTRUCTIONS:  Please follow with PCP within 1 to 2 weeks for electrolyte check. Please follow with GI if symptoms recur within 1 to 2 weeks.   SIGNIFICANT LABORATORY AND DIAGNOSTICS: Initial BUN 9, creatinine 0.63, sodium 139, potassium 3.8.  Last potassium was 3.2, which was repleted. Initial serum CO2 19. LFTs on arrival showed AST of 9, ALT of 11, otherwise within normal limits. Hemoglobin A1c was elevated at 12.6. Initial WBC 21.6.  The following day WBC was 11.5. Urine culture does show greater than 100,000 CFU gram-negative rods. Urinalysis: No nitrites or leukocyte esterase, 18 RBC, 3. WBC, no bacteria. Pregnancy test negative.   Gastric emptying study: Negative.  CT of abdomen and pelvis with contrast shows small area of decreased enhancement in the right kidney which is significantly decreased in size from prior.   HISTORY OF PRESENT ILLNESS AND HOSPITAL COURSE: For full details of H and P, please see the dictation on 06/09 by Dr. Waldron Labs, but briefly this is a 34 year old with uncontrolled diabetes, seizure history, apparently noncompliant with Lantus who came in for above chief complaint and was noted to have elevated white blood cell count of 28,000, normal lipase, and was admitted to  the hospitalist service for IV fluids, nausea medicine and GI consult. She did have significant leukocytosis which did trend down the following day without any IV antibiotics. This was likely viral gastroenteritis. The possibility of gastroparesis was raised as the patient does have elevated A1c and is a noncompliant diabetic; however, that study was completely negative. The patient has been tolerating p.o. Was seen by GI. Her PPI was increased to b.i.d. and was discharged with outpatient follow-up as above. She has had no further nausea or vomiting and vitals being stable. She did have low electrolytes that were repleted and was instructed to follow with PCP for electrolyte check. While hospitalized her seizure medications were continued. Although urine culture is positive, she is not symptomatic and has no UTI symptoms at this point will be discharged. The patient is FULL CODE.  TOTAL TIME SPENT: 32 minutes. ____________________________ Vivien Presto, MD sa:sb D: 09/03/2012 08:12:48 ET T: 09/03/2012 10:42:37 ET JOB#: [id number]  cc: Vivien Presto, MD, <Dictator> Vivien Presto MD ELECTRONICALLY SIGNED 09/20/2012 22:10

## 2014-07-16 NOTE — Discharge Summary (Signed)
PATIENT NAME:  Rachael Gray, Rachael Gray MR#:  [id number] DATE OF BIRTH:  Jan 17, 1981  DATE OF ADMISSION:  09/20/2013 DATE OF DISCHARGE:  09/23/2013  DISCHARGE DIAGNOSES:  1. Nausea, vomiting, likely from gastroparesis, much improved. Tolerated diet. 2. Seizure.  None in the hospital. Likely due to medication noncompliance.   Certainly could have pseudoseizure also.   SECONDARY DIAGNOSES:  1. Insulin-dependent diabetes mellitus.  2. Osteoporosis. 3.  Seizure  disorder.  4.  Migraine. 5.  Lupus.  CONSULTATIONS: None.   PROCEDURES AND RADIOLOGY: Left shoulder x-ray on June 29th showed no acute bony abnormality.   MAJOR LABORATORY PANEL:  UA on admission was negative.   HISTORY AND SHORT HOSPITAL COURSE: The patient is a 34 year old female with above-mentioned medical problems who was admitted for intractable nausea, vomiting, and had an episode of seizure. Please see Dr. Nichola Sizer dictated history and physical for further details. The patient did not have any further seizures while in the hospital. This was thought to be due to medication noncompliance. The patient continued to complain of nausea and vomiting, although her vomiting was not being recorded by any of the nursing staff for which she was quite upset at times. After clear expectations set to patient and nursing staff, patient was well aware not to flush any vomiting. After which, within 24 hours, she did not have any vomiting reported. She was able to tolerate the diet and was agreeable with the discharge plan.  On the date of discharge, the vital signs were as follows:  Temperature 98.4, heart rate 80 per minute, respirations 20 per minute, blood pressure 110/69 mmHg, and she was saturating 99% on room air.    PHYSICAL EXAMINATION ON THE DATE OF DISCHARGE: CARDIOVASCULAR: S1, S2 normal. No murmurs, rubs, or gallop.  LUNGS: Clear to auscultation bilaterally. No wheezing, rales, rhonchi, crepitation.  ABDOMEN: Soft, benign.  NEUROLOGIC:  Nonfocal examination.    All other physical examination remained at baseline.   DISCHARGE MEDICATIONS:  1. Levetiracetam 500 mg p.o. b.i.d.  2. Tramadol 50 mg p.o. every 4 hours as needed.  3. Insulin glargine 15 units twice a day.  4. Promethazine 12.5 mg p.o. every 6 hours.  5. Humulin R sliding scale as per her home regimen.   DISCHARGE DIET: 1800 ADA.   DISCHARGE ACTIVITY: As tolerated.  DISCHARGE INSTRUCTIONS AND FOLLOWUP: The patient was instructed to follow up with a new primary care physician that she needs to pick from her new Medicaid card in 1-2 weeks. She will need followup with [facility] in 2-4 weeks.   TOTAL TIME DISCHARGING THIS PATIENT: 55 minutes.   SHE REMAINS AT VERY HIGH RISK FOR READMISSIONS.   ____________________________ Keli Buehner S. Manuella Ghazi, MD vss:dd/am D: 09/24/2013 19:39:49 ET T: 09/25/2013 05:24:45 ET JOB#: [id number]  cc: Averi Cacioppo S. Manuella Ghazi, MD, <Dictator> [facility]   Lucina Mellow [facility] MD ELECTRONICALLY SIGNED 09/26/2013 18:42

## 2014-07-16 NOTE — Consult Note (Signed)
Chief Complaint:  Subjective/Chief Complaint The patient reports less abd apin today and had some nausea with vomiting yesterday. Her diet has been advanced to clear liquids.   VITAL SIGNS/ANCILLARY NOTES: **Vital Signs.:   11-Jul-15 07:45  Vital Signs Type Q 8hr  Temperature Temperature (F) 98.5  Celsius 36.9  Temperature Source oral  Pulse Pulse 93  Respirations Respirations 18  Systolic BP Systolic BP 686  Diastolic BP (mmHg) Diastolic BP (mmHg) 73  Mean BP 87  Pulse Ox % Pulse Ox % 98  Pulse Ox Activity Level  At rest  Oxygen Delivery Room Air/ 21 %  Nurse Fingerstick (mg/dL) FSBS (fasting range 65-99 mg/dL) 107  Comments/Interventions  Nurse Notified   Brief Assessment:  GEN well developed, well nourished   Respiratory normal resp effort   Additional Physical Exam Alert and orientated times 3   Lab Results: Routine Chem:  09-Jul-15 18:25   Glucose, Serum  136  BUN  5  Creatinine (comp)  0.44  Sodium, Serum 138  Potassium, Serum  3.1  Chloride, Serum 101  CO2, Serum 24  Calcium (Total), Serum 9.2  Anion Gap 13  Osmolality (calc) 275  eGFR (African American) >60  eGFR (Non-African American) >60 (eGFR values <47m/min/1.73 m2 may be an indication of chronic kidney disease (CKD). Calculated eGFR is useful in patients with stable renal function. The eGFR calculation will not be reliable in acutely ill patients when serum creatinine is changing rapidly. It is not useful in  patients on dialysis. The eGFR calculation may not be applicable to patients at the low and high extremes of body sizes, pregnant women, and vegetarians.)  Routine Hem:  09-Jul-15 18:25   WBC (CBC) 11.0  RBC (CBC) 4.38  Hemoglobin (CBC) 12.7  Hematocrit (CBC) 38.0  Platelet Count (CBC) 283  MCV 87  MCH 29.1  MCHC 33.5  RDW 12.5  Neutrophil % 54.4  Lymphocyte % 34.4  Monocyte % 9.2  Eosinophil % 1.2  Basophil % 0.8  Neutrophil # 6.0  Lymphocyte #  3.8  Monocyte #  1.0   Eosinophil # 0.1  Basophil # 0.1 (Result(s) reported on 30 Sep 2013 at 06:51PM.)  Erythrocyte Sed Rate  30 (Result(s) reported on 30 Sep 2013 at 06:51PM.)   Assessment/Plan:  Assessment/Plan:  Assessment Nausea and vomiting with grade 4 erosive esophaigitis.   Plan Patient feeling better. Advance diet as tolerated.  Continue supportive care.   Electronic Signatures: WLucilla Lame(MD)  (Signed 11-Jul-15 09:09)  Authored: Chief Complaint, VITAL SIGNS/ANCILLARY NOTES, Brief Assessment, Lab Results, Assessment/Plan   Last Updated: 11-Jul-15 09:09 by WLucilla Lame(MD)

## 2014-07-16 NOTE — Discharge Summary (Signed)
Dates of Admission and Diagnosis:  Date of Admission 28-Jun-2013   Date of Discharge 29-Jun-2013   Admitting Diagnosis Gastritis, Seizure   Final Diagnosis 1. Gastritis 2. Seizures    Chief Complaint/History of Present Illness CHIEF COMPLAINT: Intractable nausea, vomiting and seizures.   HISTORY OF PRESENT ILLNESS: The patient is a 34 year old, pleasant female with a past medical history of insulin-dependent diabetes mellitus, suspected gastroparesis and history of seizures on Keppra, is presenting to the Emergency Room with a chief complaint of four witnessed seizures at home, associated with generalized abdominal pain and nausea, vomiting for the past three days. During my examination, the patient is reporting that she has been vomiting continuously for the past 2 to 3 days, and was unable to keep any p.o. She has a history of seizures and takes Keppra on a daily basis. For the past two days, as she was having nausea and intractable vomiting, she was unable to take her p.o. medicines either. Last night, she had four episodes of seizures, back-to-back, which were witnessed at home. Family members called EMS, and the patient was sent over to the Emergency Room. The patient was complaining of generalized abdominal pain associated with nausea and vomiting for the past three days. At the time of arrival, she was anxious and tearful. She was tachypneic also. Zofran 4 mg was given on her way to the Emergency Room by the EMS. The patient's initial magnesium was extremely low at 1.1, regarding which 4 grams of magnesium sulfate was given in the Emergency Room by the Emergency Room physician. Repeat magnesium level was at 2.5.   Chief Complaint/History of Present Illness cont'd CAT scan of the abdomen and pelvis with contrast was done, which has revealed no acute abnormalities. Hospitalist team is called to admit the patient. During my examination, the patient is resting comfortably, still complaining of  nausea and just requesting for clear liquids. The patient is just complaining of pain in the back of her neck and head. She thinks she might have traumatized while she was seizing. There was a small lump on the back of the neck, but the patient was able to move her neck from side to side. The patient has reported that her first episode of seizure was during her pregnancy, and she was started on Keppra at that time by the neurologist. Following that, she was not followed up by any neurologist. She is not quite sure why she is having seizures. Denies any diarrhea. No fever. No sick contacts.   Allergies:  Tylenol: Rash  Tylenol: Rash  Doxycycline: Rash  Aspirin: Rash    Routine Chem:  07-Apr-15 01:16   Magnesium, Serum  2.5 (1.8-2.4 THERAPEUTIC RANGE: 4-7 mg/dL TOXIC: > 10 mg/dL  -----------------------)    06:12   Glucose, Serum  287  BUN  4  Creatinine (comp) 0.64  Sodium, Serum 137  Potassium, Serum 3.8  Chloride, Serum 106  CO2, Serum 25  Calcium (Total), Serum  7.6  Anion Gap  6  Osmolality (calc) 281  eGFR (African American) >60  eGFR (Non-African American) >60 (eGFR values <50m/min/1.73 m2 may be an indication of chronic kidney disease (CKD). Calculated eGFR is useful in patients with stable renal function. The eGFR calculation will not be reliable in acutely ill patients when serum creatinine is changing rapidly. It is not useful in  patients on dialysis. The eGFR calculation may not be applicable to patients at the low and high extremes of body sizes, pregnant women, and vegetarians.)  Urine Drugs:  31-DVV-61 60:73   Tricyclic Antidepressant, Ur Qual (comp) NEGATIVE (Result(s) reported on 29 Jun 2013 at 01:24AM.)  Amphetamines, Urine Qual. NEGATIVE  MDMA, Urine Qual. NEGATIVE  Cocaine Metabolite, Urine Qual. NEGATIVE  Opiate, Urine qual POSITIVE  Phencyclidine, Urine Qual. NEGATIVE  Cannabinoid, Urine Qual. POSITIVE  Barbiturates, Urine Qual. NEGATIVE   Benzodiazepine, Urine Qual. NEGATIVE (----------------- The URINE DRUG SCREEN provides only a preliminary, unconfirmed analytical test result and should not be used for non-medical  purposes.  Clinical consideration and professional judgment should be  applied to any positive drug screen result due to possible interfering substances.  A more specific alternate chemical method must be used in order to obtain a confirmed analytical result.  Gas chromatography/mass spectrometry (GC/MS) is the preferred confirmatory method.)  Methadone, Urine Qual. NEGATIVE  Routine UA:  07-Apr-15 00:27   Color (UA) Red  Clarity (UA) Hazy  Glucose (UA) >=500  Bilirubin (UA) Negative  Ketones (UA) Negative  Specific Gravity (UA) 1.044  Blood (UA) 3+  pH (UA) 7.0  Protein (UA) 30 mg/dL  Nitrite (UA) Negative  Leukocyte Esterase (UA) Negative (Result(s) reported on 29 Jun 2013 at 01:26AM.)  RBC (UA) 3372 /HPF  WBC (UA) 5 /HPF  Bacteria (UA) NONE SEEN  Epithelial Cells (UA) 3 /HPF  Mucous (UA) PRESENT (Result(s) reported on 29 Jun 2013 at 01:26AM.)  Routine Hem:  07-Apr-15 06:12   WBC (CBC) 9.8  RBC (CBC) 4.27  Hemoglobin (CBC) 12.2  Hematocrit (CBC) 36.8  Platelet Count (CBC)  144  MCV 86  MCH 28.5  MCHC 33.0  RDW 13.7  Neutrophil % 58.4  Lymphocyte % 34.9  Monocyte % 5.1  Eosinophil % 0.9  Basophil % 0.7  Neutrophil # 5.7  Lymphocyte # 3.4  Monocyte # 0.5  Eosinophil # 0.1  Basophil # 0.1 (Result(s) reported on 29 Jun 2013 at 06:56AM.)   Pertinent Past History:  Pertinent Past History Seizures Gastritis   Hospital Course:  Hospital Course * Gastriits Has had recurrent admissions for vomiting. Gastric emptying study negative. Did have gatritis on prior EGD. Added PPI that she stopped taking. Nausea meds. Tolerated PO in hospital prior to d/c.  * Seizures due to unable to keep keppra tabs down. Restarted IV then PO once vomtiing resolved and seizure free in hosp. Seen by  Dr. Irish Elders. F/U with neuro as OP  Prior to d/c. Abd-soft, NT, BS present. S1, S2 Alert awake and ambulating on own  Pt seen and examined and discharged on 06/29/2013  Time spent on discharge 40 min   Condition on Discharge Fair   Code Status:  Code Status Full Code   DISCHARGE INSTRUCTIONS HOME MEDS:  Medication Reconciliation: Patient's Home Medications at Discharge:     Medication Instructions  levetiracetam 500 mg oral tablet  1 tab(s) orally 2 times a day   oxycodone 10 mg oral tablet  1 tab(s) orally every 6 hours, As Needed - for Pain   promethazine 25 mg oral tablet  1 tab(s) orally every 6 hours, As Needed - for Nausea, Vomiting   pantoprazole 40 mg oral delayed release tablet  1 tab(s) orally 2 times a day   metoclopramide 5 mg oral tablet, disintegrating  1 tab(s) orally 3 times a day, As Needed - for Nausea, Vomiting. Take scheduled 3 times a day before meals for 3 days and then as needed.    STOP TAKING THE FOLLOWING MEDICATION(S):    esomeprazole 20 mg oral delayedrelease capsule: 1 cap(s)  orally once a day  Physician's Instructions:  Diet Regular  Eat light for the first meal   Activity Limitations As tolerated   Return to Work Not Applicable   Time frame for Follow Up Appointment 1-2 weeks  PCP   Time frame for Follow Up Appointment 1-2 weeks  [facility]   Electronic Signatures: Maryfrances Portugal, Lottie Dawson (MD)  (Signed 11-Apr-15 16:32)  Authored: ADMISSION DATE AND DIAGNOSIS, CHIEF COMPLAINT/HPI, Allergies, PERTINENT LABS, [place], PATIENT INSTRUCTIONS   Last Updated: 11-Apr-15 16:32 by Alba Destine (MD)

## 2014-07-16 NOTE — Discharge Summary (Signed)
PATIENT NAME:  Rachael Gray, Rachael Gray MR#:  [id number] DATE OF BIRTH:  1980/11/17  DATE OF ADMISSION:  09/27/2013 DATE OF DISCHARGE:  10/04/2013  DISCHARGE DIAGNOSES:  1. Vomiting and abdominal pain secondary to severe erosive esophagitis and gastritis.  2.  Hypokalemia.  3.  Constipation.  4.  Seizures.  5.  Insulin-dependent diabetes mellitus.   DISCHARGE MEDICATIONS:  1.  Keppra 500 mg p.o. b.i.d.  2.  Tramadol 50 mg every 4 hours as needed for pain.  3.  Phenergan 12.5 mg every 6 hours.  4.  ( as needed for nausea.  5.  Humulin R 100 units/mL.  Patient is on sliding scale.  6.  Reglan 10 mg 4 times daily after meals and at bedtime.  7.  Lantus 5 units subcutaneous b.i.d.  8.  Pantoprazole 40 mg p.o. b.i.d.  9.  Sucralfate 1 gram p.o. 4 times daily.  10.  Oxycodone 5 mg every 8 hours as needed for pain.   DIET: Low-sodium, carb-control diet. The patient advised to eat a soft diet for a few days and then resume regular diet as tolerated.   FOLLOWUP:  Follow up with gastroenterology Dr. Allen Norris on July 22 at 9:30 a.m. at [facility] office.   CONSULTATIONS: GI consult with Dr. Lucilla Lame and neurology Dr. Irish Elders.  PROCEDURES: Patient had an EGD done and upper endoscopy done on July 8, and the results shows large grade D reflux esophagitis and gastritis, and normal duodenum. The patient advised to have gastric emptying study.   HOSPITAL COURSE: 1.  Abdominal pain, nausea, and vomiting. The patient is a 34 year old African American female with admission on July 2 for similar complaint.   Went home and came in because of nausea, vomiting and abdominal pain, and also recurrent seizures. Look at the history and physical for full details. The patient was admitted on June 29 and discharged on July 2. The patient admitted to hospitalist service for intractable nausea, vomiting, and abdominal pain. The patient was kept n.p.o., and started IV fluids along with IV PPI, and IV pain medications and  nausea medications. The patient was seen by gastroenterology Dr. Allen Norris.  The patient had a CT abdomen and pelvis which was unremarkable, and she had an endoscopy, which is EGD, which showed severe erosive esophagitis.  So we gave her Protonix 40 mg IV b.i.d. and also IV fluids along with Phenergan.  Patient's symptoms improved slowly and we started her on liquid diet. The patient tolerated the diet and we stopped IV fluids and changed to soft diet and, with GI, we told her that she can very slowly advance her diet at home.  Patient did not have any nausea, vomiting, or abdominal pain. All the symptoms improved with fluids and PPIs and nausea medicines of Zofran and Reglan.   2.  Recurrent seizures, seen by Dr. Irish Elders on the day of admission. Patient, because of nausea and vomiting, could not tolerate p.o. Keppra, so we gave her IV Keppra 500 mg b.i.d. and Dr. Irish Elders did not recommend any further work-up because patient's seizures were thought to be secondary to intolerance to p.o. stuff  due to nausea and vomiting,  so we continued IV PPI, IV Keppra, and changed to p.o. Keppra, and her nausea improved. The patient also had hypokalemia because of nausea and vomiting, and that also improved with replacement.    3.  Insulin-dependent diabetes.  Patient has Lantus and also insulin with sliding scale, so we told her that she can have  blood sugars checked and adjust the Lantus, but she is on low-dose at the time of discharge.   4.  Chronic pain syndrome. The patient is on tramadol and oxycodone and she needs to see a pain specialist for chronic pain management needs. Patient is continued on PPIs and sucralfate and she needs to see  Dr. Allen Norris as an outpatient.   TIME SPENT ON DISCHARGE PREPARATION: More than 30 minutes.    ____________________________ Epifanio Lesches, MD sk:ts D: 10/14/2013 12:15:45 ET T: 10/14/2013 13:27:05 ET JOB#: [id number]  cc: Epifanio Lesches, MD, <Dictator> Epifanio Lesches MD ELECTRONICALLY SIGNED 10/15/2013 11:27

## 2014-07-16 NOTE — Consult Note (Signed)
PATIENT NAME:  Rachael Gray, Rachael Gray MR#:  [id number] DATE OF BIRTH:  1980-10-28  DATE OF CONSULTATION:  02/25/2014  REASON FOR CONSULTATION:  Seizure activity.  HISTORY OF PRESENT ILLNESS: This 34 year old African American female with past medical history of reflux esophagitis and history of nausea, abdominal pain. Also, history of seizures, insulin-dependent diabetes, admitted with suspected diabetic ketoacidosis.  The patient present with seizure activity yesterday, status post load of Keppra, but the patient was not compliant with her antiepileptic medication and because of nausea, vomiting, could not take the p.o. medications.  Currently, no seizures this morning.  PAST MEDICAL HISTORY:  Insulin-dependent diabetes, gastroparesis, history of seizure disorder, reflux esophagitis.   SOCIAL HISTORY: Lives at home with mother.  Daily smoker, 1 pack per day. Denies alcohol, or illicit drug use.   MEDICATIONS: Have been reviewed.   REVIEW OF SYSTEMS:  Unable to obtain as the patient is somnolent post Ativan.   PHYSICAL EXAMINATION: VITAL SIGNS: Include a temperature of 97.8, pulse 89, respirations 18, blood pressure 116/79.  NEUROLOGIC: The patient is somnolent, difficulty to follow commands status post Ativan, but symmetrical movement in bilateral upper and lower extremities. Responds to visual threats.  Does look around the room. Generalized weakness.   IMPRESSION: A 34 year old female with past medical history significant of history of esophagitis, gastroesophageal reflux disease, history of seizure disorder,  presents with seizures, status post Keppra load. I have increased her Keppra to 750 b.i.d.   PLAN: I think the seizures are related to possibly DKA acidosis, as well as noncompliance with medications and nausea, vomiting. Continue the Keppra 750 b.i.d. Discharge planning, I do not think the patient needs any further imaging. No need for EEG at this point.   Thank you. It was pleasure  seeing this patient.    ____________________________ Leotis Pain, MD yz:DT D: 02/25/2014 14:08:05 ET T: 02/25/2014 14:28:05 ET JOB#: [id number]  cc: Leotis Pain, MD, <Dictator> Leotis Pain MD ELECTRONICALLY SIGNED 03/23/2014 13:48

## 2014-07-16 NOTE — Consult Note (Signed)
Brief Consult Note: Diagnosis: Recurrent nausea and vomiting. Patient recently discharged with the same. Now with abd pain with vomiting.   Patient was seen by consultant.   Consult note dictated.   Comments: Patient recurrent comiting. Pain muscular in nature. Will plan for EGD for tomorrow to r/o gastric outlet obstruction.  Electronic Signatures: Lucilla Lame (MD)  (Signed 07-Jul-15 12:04)  Authored: Brief Consult Note   Last Updated: 07-Jul-15 12:04 by Lucilla Lame (MD)

## 2014-07-16 NOTE — Consult Note (Signed)
PATIENT NAME:  Rachael Gray, Rachael Gray MR#:  [id number] DATE OF BIRTH:  [birth year]  DATE OF CONSULTATION:  09/27/2013  REFERRING PHYSICIAN:   CONSULTING PHYSICIAN:  Leotis Pain, MD  HISTORY OF PRESENT ILLNESS: This is a 34 year old African American female with past medical history of insulin-dependent diabetes, history of seizure and is controlled on Keppra status post recent discharge on June 22nd at which time she was admitted on June 22nd with nausea and vomiting and assumed gastroparesis. After she was discharged home she started having increased bouts of vomiting, could not hold down her medications, specifically her antiepileptic Keppra that she is taking and had about 3 seizure episodes described as tonic-clonic. For that reason she came to the Emergency Department. On admission the patient is complaining of epigastric pain. She was given IV fluids, loaded with Keppra, and started on IV Keppra. No seizures since admission, but she is still complaining of epigastric pain.   PAST MEDICAL HISTORY: Insulin-dependent diabetes, gastroparesis, chronic history of seizures, migraines, and lupus.  PAST MEDICAL HISTORY: Appendectomy, cholecystectomy, C-section.   ALLERGIES: Include ASPIRIN, DILANTIN, DOXYCYCLINE, TYLENOL.   HOME MEDICATIONS: Include tramadol, promethazine, metoclopramide, Keppra 500 mg b.i.d.  PSYCHOSOCIAL HISTORY: Lives at home with mother. Has daily 1 pack per day smoking history. Denies EtOH or drug use.   FAMILY HISTORY: Cardiac problems, specifically on her father's side.  REVIEW OF SYSTEMS:  CONSTITUTIONAL: Currently denies any fever. Complains of generalized fatigue.  HEENT: No blurred vision. No dry mouth. PULMONARY: No cough, wheezing.  CARDIOVASCULAR: No chest pain. GASTROINTESTINAL: No abdominal pain. Complains of nausea. NEUROLOGIC: No weakness on one side of the body compared to the other.  MUSCULOSKELETAL: No joint pain. No muscle pain. PSYCHIATRIC: No anxiety.  No depression.   PHYSICAL EXAMINATION: VITAL SIGNS: Includes a temperature of 98, pulse 82, respirations 18, blood pressure 134/78 and saturating well with pulse ox 99% on room air.  NEUROLOGIC: The patient is alert, awake, and oriented to time, place, location, and the reason why she is in the hospital. Facial sensation intact. Facial motor is intact. Extraocular movements intact. Visual fields intact bilaterally. Tongue is midline. Uvula elevates symmetrically. Shoulder shrug intact. Motor strength appears to be 5/5 bilaterally in upper and lower extremities. Sensation intact to light touch and temperature. Coordination intact to finger-to-nose. Reflexes are 2+ symmetrical. Gait could not be assessed.   IMPRESSION: A 34 year old female with history of diabetes and lupus and well known history of seizure disorder, on Keppra, well controlled, comes in with generalized tonic-clonic seizures, about 3 of them. The patient had bouts of vomiting. She was recently discharged July 2nd for similar reasons. I suspect the seizures are because she could not hold down her antiepileptic medication, which is Keppra specifically.   PLAN: I agree with continuing Keppra 500 mg IV. I do not think she needs any further imaging. I do not think she needs any an EEG at this point. I think since we know the reason for her seizures. At this point, looking over home medications, would discontinue tramadol and start her on another medication as tramadol has a high tendency of lowering seizure threshold. This case was discussed with the patient at the bedside.   Thank you. It was a pleasure seeing this patient. Please call with any questions.   ____________________________ Leotis Pain, MD yz:sb D: 09/27/2013 12:53:42 ET T: 09/27/2013 13:32:03 ET JOB#: [id number]  cc: Leotis Pain, MD, <Dictator> Leotis Pain MD ELECTRONICALLY SIGNED 10/26/2013 21:10

## 2014-07-16 NOTE — Consult Note (Signed)
Chief Complaint:  Subjective/Chief Complaint Patient reports feeling better today. Abd pain muscular from vomiting. Reviewed hospitilist note about CT scan. Patient states she is improving slowly.   VITAL SIGNS/ANCILLARY NOTES: **Vital Signs.:   10-Jul-15 07:45  Vital Signs Type Q 8hr  Temperature Temperature (F) 99.1  Celsius 37.2  Temperature Source oral  Pulse Pulse 96  Respirations Respirations 18  Systolic BP Systolic BP 622  Diastolic BP (mmHg) Diastolic BP (mmHg) 80  Mean BP 98  Pulse Ox % Pulse Ox % 98  Pulse Ox Activity Level  At rest  Oxygen Delivery Room Air/ 21 %   Brief Assessment:  GEN well developed, well nourished, no acute distress   Cardiac Regular   Respiratory normal resp effort  clear BS   Gastrointestinal Normal   Gastrointestinal details normal Soft   Additional Physical Exam Alert and orientated times 3   Lab Results: Routine Chem:  09-Jul-15 18:25   Glucose, Serum  136  BUN  5  Creatinine (comp)  0.44  Sodium, Serum 138  Potassium, Serum  3.1  Chloride, Serum 101  CO2, Serum 24  Calcium (Total), Serum 9.2  Anion Gap 13  Osmolality (calc) 275  eGFR (African American) >60  eGFR (Non-African American) >60 (eGFR values <85m/min/1.73 m2 may be an indication of chronic kidney disease (CKD). Calculated eGFR is useful in patients with stable renal function. The eGFR calculation will not be reliable in acutely ill patients when serum creatinine is changing rapidly. It is not useful in  patients on dialysis. The eGFR calculation may not be applicable to patients at the low and high extremes of body sizes, pregnant women, and vegetarians.)  Routine Hem:  09-Jul-15 18:25   WBC (CBC) 11.0  RBC (CBC) 4.38  Hemoglobin (CBC) 12.7  Hematocrit (CBC) 38.0  Platelet Count (CBC) 283  MCV 87  MCH 29.1  MCHC 33.5  RDW 12.5  Neutrophil % 54.4  Lymphocyte % 34.4  Monocyte % 9.2  Eosinophil % 1.2  Basophil % 0.8  Neutrophil # 6.0  Lymphocyte #   3.8  Monocyte #  1.0  Eosinophil # 0.1  Basophil # 0.1 (Result(s) reported on 30 Sep 2013 at 06:51PM.)  Erythrocyte Sed Rate  30 (Result(s) reported on 30 Sep 2013 at 06:51PM.)   Assessment/Plan:  Assessment/Plan:  Assessment Abd pain.- Muscular from vomiting. Nausea and vomiting- likely from grade 4 esophagitis and constipation.   Plan Continue supportive care. Consider gastric emptying study when vomiting stops, possibly as a outpatient.   Electronic Signatures: WLucilla Lame(MD)  (Signed 10-Jul-15 15:10)  Authored: Chief Complaint, VITAL SIGNS/ANCILLARY NOTES, Brief Assessment, Lab Results, Assessment/Plan   Last Updated: 10-Jul-15 15:10 by WLucilla Lame(MD)

## 2014-07-16 NOTE — Consult Note (Signed)
  I have seen and examined [name] and agree with Rachael Gray a/p.  [name] is reporting severe abdominal pain along with the n/v and is screaming out for pain medication.   She is extremely tender to even light palpiaton in epigastrim, RUQ, LUQ  although this may be sowewhat exaggerated.   has had several similar presentations and had a CT 02/2013 which was unremarkable.     she is currently in severe pain and is very tender to palpation,  I believe it is necessary to proceed with another CT a/p to r/o severe pathology.   this CT is negative, this raises the suspicion that this pain, n/v may be more psychogenic in nature.  cont to follow.     Electronic Signatures: Arther Dames (MD)  (Signed on 06-Mar-15 18:09)  Authored  Last Updated: 06-Mar-15 18:09 by Arther Dames (MD)

## 2014-07-16 NOTE — Consult Note (Signed)
PATIENT NAME:  Rachael Gray, Rachael Gray MR#:  [id number] DATE OF BIRTH:  Oct 17, 1980  DATE OF CONSULTATION:  05/28/2013  REFERRING PHYSICIAN:  Dr. Posey Pronto CONSULTING PHYSICIAN:  Arther Dames, MD / Corky Sox. Dilon Lank, PA-C  REASON FOR CONSULTATION: Recurrent nausea and vomiting.   HISTORY OF PRESENT ILLNESS: This is a pleasant 34 year old female who was initially admitted following some seizure activity with intractable nausea and vomiting. She does have a history of multiple admissions for intractable nausea and vomiting associated with seizures as well. There has been some concerns in the past that the patient vomits up her seizure medication and as a result this throws her into seizure activity. She has undergone extensive work-up in the past to try and evaluate these episodes of nausea and vomiting and there has been a questionable history of gastroparesis. Within the past year or so she has had a normal gastric emptying study. Prior to being admitted, the patient explains to me that she checked her blood sugars and they run 117. In the Emergency Room, however, they had significantly increased and were greater than 300. As a result, she was admitted due to the recurring seizure activity and intractable nausea and vomiting. Since being admitted, she has continued to feel extremely nauseated and has been vomiting up until this morning. She has been kept n.p.o. status and does admit that she is afraid to eat. She has not vomited for several hours, but she also has not had any p.o. intake. LFTs were within normal limits previously in the ER. Blood sugars have trended down somewhat to 227. Chest x-ray was obtained showing no evidence of active disease, and she did have a mildly elevated white blood cell count. She is denying any abdominal pain associated with this. There have been no fever or chills. No recent travel or sick contacts. Unfortunately, she has been unable to identify any sort of trigger that can throw her  into these episodes of nausea and vomiting, but they have been a recurring issue now for several years. Thus far she has not been given a clear answer, but for the most part previous providers have alluded to the fact that this could be gastroparesis. When asked about any prior EGDs, she thinks that she may have had one within the past couple of years, but cannot tell me for certain. Of note, she does not have a gallbladder. No unintentional weight changes. No chest pain or shortness of breath. No changes to her bathroom habits, and she denies any diarrhea, constipation, bright red blood per rectum, or melena.   PAST MEDICAL HISTORY: Chronic abdominal pain, seizure disorder, and diabetes mellitus with possible gastroparesis.   ALLERGIES: ASPIRIN, TYLENOL, AND DOXYCYCLINE.   PAST SURGICAL HISTORY: Right oophorectomy, appendectomy, cholecystectomy, surgery for endometriosis, also history of 5 to 6 laparoscopies to evaluate chronic abdominal pain and endometriosis.   SOCIAL HISTORY: The patient is a daily tobacco smoker and is not quite interested in quitting. She denies any alcohol or illicit drug use.   FAMILY HISTORY: Cory Roughen has had gastroparesis. Grandmother also had breast cancer. An uncle was just newly diagnosed with colon cancer. Father has had diabetes mellitus type 2.   REVIEW OF SYSTEMS: A 10 system review was obtained on the patient. Pertinent positives are mentioned above and otherwise negative.   PHYSICAL EXAMINATION:  VITAL SIGNS: Blood pressure 99/62, heart rate 90, respirations 18, temp 98.2, bedside pulse ox 99%.  GENERAL: This is a pleasant 34 year old female resting quietly and comfortably in bed,  in no acute distress, alert and oriented x3.  HEAD: Atraumatic, normocephalic.  NECK: Supple. No lymphadenopathy noted.  HEENT: Sclerae anicteric. Mucous membranes moist.  LUNGS: Respirations are even and unlabored. Clear to auscultation in bilateral anterior lung fields.   CARDIAC: Regular rate and rhythm. S1, S2 noted.  ABDOMEN: Soft, nontender, nondistended. Normoactive bowel sounds noted in all 4 quadrants. No guarding or rebound. No masses, hernias, or organomegaly appreciated.  RECTAL: Deferred.  PSYCHIATRIC: Appropriate mood and affect.  NEUROLOGIC: Cranial nerves II through XII are grossly intact.  EXTREMITIES: Negative for lower extremity edema, 2+ pulses noted in bilateral upper extremities.   DIAGNOSTICS: Laboratory data: White blood cells 16.3, hemoglobin 13.7, hematocrit 42, MCV 87, platelets 195,000. Sodium 138, potassium 3.7, BUN 8, creatinine 0.68, glucose 227 down from 322. Magnesium 1.7. LFTs were within normal limits on her recent ER visit prior to admission.   Imaging: Chest x-ray was obtained on the patient showing no evidence of acute cardiopulmonary disease.   Per reports, a gastric emptying study was obtained last year at some point and was negative for delayed emptying.   ASSESSMENT: 1.  Intractable nausea and vomiting. This seems to be an episodic issue and she has a history of multiple prior admissions for similar reasons.  2.  Seizures. The patient recently had seizure activity yesterday, which we suspect was related to her vomiting up her seizure medication. She has since been started on IV Keppra and Ativan.  3.  Diabetes mellitus with hyperglycemia at admission with blood glucose level being greater than 300.   PLAN: I have discussed this patient's case in detail with Dr. Arther Dames who is involved in the development of the patient's plan of care. Her nausea and vomiting at this point could certainly be from a number of things and may be multifactorial. Her blood sugars were significantly elevated at admission, which could have certainly been contributing. The patient did however have a normal gastric emptying study which does raise some questions on other potential etiologies. We do agree with her being on a PPI twice a day. We are  going to change her Phenergan and Zofran prescriptions to being scheduled as opposed to p.r.n. to see if this can help settle her stomach as well. Her last EGD was Dec 2014 by Dr Gustavo Lah, and revealed only mild gastritis with a normal duodenum and normal esophagus. Therefore, repeating this procedure at this point would likely be low-yield. Could consider repeating Gastric Emptying Study. May be n/v secondary to hyperglycemia. Will continue to follow The above plan was outlined in detail for the patient and all questions were answered. We will continue to follow this patient throughout hospitalization and make further recommendations per clinical course.   Thank you so much for this consultation and for allowing [place] to participate in the patient's plan of care.  This services provided by Corky Sox. Florrie Ramires, PA-C under collaborative agreement with Dr. Arther Dames.  ____________________________ Corky Sox. Ernestene Coover, PA-C kme:sb D: 05/28/2013 13:46:49 ET T: 05/28/2013 14:14:38 ET JOB#: [id number]  cc: Corky Sox. Cecil Bixby, PA-C, <Dictator> [place] PA ELECTRONICALLY SIGNED 05/28/2013 14:53

## 2014-07-16 NOTE — H&P (Signed)
PATIENT NAME:  Rachael Gray, Rachael Gray MR#:  [id number] DATE OF BIRTH:  02-05-1981  DATE OF ADMISSION:  09/20/2013  PRIMARY CARE PHYSICIAN: She goes to [facility] for her primary care needs.    REFERRING EMERGENCY ROOM PHYSICIAN: Dr. Lisa Roca.   CHIEF COMPLAINT: Intractable nausea and vomiting and seizures.   HISTORY OF PRESENT ILLNESS: A 33 year old female with past medical history of insulin-dependent diabetes, suspected gastroparesis and history of seizures on Keppra. Presenting to Emergency Room with chief complaint of witnessed seizures at home. She has generalized abdominal pain with nausea and vomiting for the last 3 days. She came to the Emergency Room but was discharged home from ER after given symptomatic treatment. Because of severe nausea and vomiting, unable to take her seizure medication Keppra for the last 2 days, so today she had 1 major episode of seizure and 5 to 10 small episodes which were witnessed by her mother. As per her, she had loss of control of her urination while having seizure. Brought to Emergency Room after this and given injection medications to control her nausea, pain and seizures. Still continued complaining of feeling nauseous, so given to hospitalist team for further management.   REVIEW OF SYSTEMS:  CONSTITUTIONAL: Negative for fever, fatigue, weakness, pain or weight loss.  EYES: No blurring or double vision, discharge or redness.  EARS, NOSE, THROAT: No tinnitus, ear pain or hearing loss.  RESPIRATORY: N cough, wheezing, hemoptysis or shortness of breath.  CARDIOVASCULAR: No chest pain, orthopnea, edema, arrhythmia, palpitation.  GASTROINTESTINAL: The patient has nausea, vomiting and abdominal pain.  GENITOURINARY: No dysuria, hematuria or increased frequency.  ENDOCRINE: No heat or cold intolerance. No excessive sweating.  MUSCULOSKELETAL: No pain or swelling in the joints.  NEUROLOGICAL: No numbness, weakness, tremor or vertigo.   PSYCHIATRIC: No anxiety, insomnia or bipolar disorder.   PAST MEDICAL HISTORY: Insulin-dependent diabetes mellitus, gastroparesis, seizure disorder, migraine, lupus.   PAST SURGICAL HISTORY: Appendectomy, cholecystectomy, C-section.   PSYCHOSOCIAL HISTORY: Lives at home with mother. She smokes 1 pack of cigarettes every day. Denies alcohol or illegal drug use.   FAMILY HISTORY: Mother is healthy, and father has some heart problem. Grandmother has diabetes and lupus.   HOME MEDICATIONS:  1. Tramadol 50 mg oral tablet every 4 hours as needed.  2. Keppra 500 mg oral 2 times a day.  3. Lantus subcutaneous as needed 2 times a day.   PHYSICAL EXAMINATION:  VITAL SIGNS: In the ER, temperature 98.8, pulse 74, respirations 17, blood pressure 124/65, pulse ox is 97 on room air.  GENERAL: The patient is fully alert and oriented. Slightly distressed due to nausea and pain.  HEENT: Head and neck atraumatic. Conjunctivae pink. Oral mucosa moist.  NECK: Supple. No JVD.  RESPIRATORY: Bilateral equal and clear air entry.  CARDIOVASCULAR: S1, S2 present, regular. No murmur.  ABDOMEN: Soft, nontender. Bowel sounds present. No organomegaly.  SKIN: No rashes.  LEGS: No edema.  JOINTS: No swelling or tenderness.  NEUROLOGICAL: No numbness, weakness. Power 5 out of 5. No tremors or rigidity.  PSYCHIATRIC: Does not appear in any acute distress.   IMPORTANT LABORATORY RESULTS: Glucose 294, BUN 4, creatinine 0.71, sodium 137, potassium 3.6, chloride 104 and CO2 is 25. Calcium is 9. Pregnancy test is negative.   Total protein 7.4, albumin 3.6, bilirubin 0.1, alkaline phosphate 51, SGOT 7, SGPT is 14. CK total is 40. Urine for toxicology is positive for cannabinoid and opiate. WBC 8.8, hemoglobin 12.9, platelet count is 320  and MCV is 87.    Urinalysis is negative.   ASSESSMENT AND PLAN: A 34 year old female with past history of seizure, insulin-dependent diabetes and gastroparesis. Came to Emergency Room  as she missed her seizure medication because of gastroparesis and had seizures.  1. Noncompliance seizure: This is as she missed her seizure medication secondary to her vomiting and nausea and had seizure. Will restart her Keppra and give her Ativan as needed for seizure disorder. No further workup needed as we have the reason why she had seizures.  2. Intractable nausea and vomiting: This is secondary to diabetic gastroparesis. Will give Zofran and Phenergan on as needed injection and wait for recovery.  3. Insulin-dependent diabetes mellitus: Currently, the patient has decreased oral intake. Will keep her on liquid diet because of nausea and vomiting, so we will just put her on sliding scale insulin coverage.  4. Lupus: Does not appear to be taking any medication for that. I advised her to continue following with primary care for this issue.  5. Smoking: Smoking cessation counseling is done for 4 minutes and offered nicotine patch. The patient agrees to try that.   CODE STATUS: FULL CODE.   TOTAL TIME SPENT ON THIS ADMISSION: 50 minutes.   ____________________________ Ceasar Lund Anselm Jungling, MD vgv:gb D: 09/20/2013 16:34:01 ET T: 09/21/2013 01:25:00 ET JOB#: [id number]  cc: Ceasar Lund. Anselm Jungling, MD, <Dictator> Vaughan Basta MD ELECTRONICALLY SIGNED 09/21/2013 16:46

## 2014-07-16 NOTE — H&P (Signed)
PATIENT NAME:  Rachael Gray, Rachael Gray MR#:  [id number] DATE OF BIRTH:  1981/01/23  DATE OF ADMISSION:  05/27/2013  REFERRING PHYSICIAN: Dr. Thomasene Lot.   PRIMARY CARE PHYSICIAN: [facility].   CHIEF COMPLAINT: Nausea, vomiting, seizures.   HISTORY OF PRESENT ILLNESS: The patient is a 34 year old, African American female with chronic abdominal pain, diabetes, seizure disorder with multiple admissions for intractable nausea and vomiting who presents with same. The patient stated that she had beginning yesterday morning, multiple episodes of nausea, vomiting. She had unfortunately has just received some Ativan, Keppra and some antiemetics and is sleepy. She is arousable. She mumbles a few words and goes back to sleep. She is unable to provide much history. The history was obtained from the ER physician and staff. Apparently patient came into the ER earlier in the morning and which appears to have received some medications including some lorazepam for the seizure which she presented with in the morning. She also was noted to have some vomiting. She was given some antiemetic including Zofran, some morphine Reglan injection in addition to Roxicodone. She was discharged from the ER but states that she had persistent nausea, vomiting and had 2 more seizures and came back to the ER via an ambulance. Here she received IV Keppra 750 mg x 1, in addition to another milligram of Ativan. Currently, she is unable to provide much history, otherwise. Hospitalist services were contacted for further evaluation and management.   PAST MEDICAL HISTORY: Insulin-dependent diabetes mellitus, history of seizure disorder, suspected gastroparesis prior; however, she had a negative gastric emptying study middle of last year.  Earlier in the morning labs showed no nitrites or leukocyte esterase in the urine, 8 WBC, 3 bacteria and less than 2 acetaminophen under serum tox. White count was 9 in the morning, hemoglobin 15, platelets  209. LFTs showed AST and ALT both below normal levels but otherwise LFTs were within normal limits. Lipase was 252. Magnesium was 1.6, glucose 368, BUN 7, creatinine 0.65. Sodium 135, potassium 3.8, chloride is 103. Serum CO2 is 27. Chest x-ray, one view, shows no active disease.   ASSESSMENT AND PLAN: We have a 34 year old African American female with history of diabetes, seizure disorder, suspected gastroparesis in the past who has been having nausea, vomiting, seizures and 2 ER visits. We will admit the patient for observation. She has received Ativan and, unfortunately, is not a reliable historian at this time and cannot provide much history. We would start the patient on some gentle fluids. Although I wanted to see if she is really taking Keppra or not. I cannot get a level at this point because I try to add Keppra level to a.m. labs and I was not successful, and she has just received an IV dose of Keppra. I suspect that her seizures are likely secondary to not absorbing Keppra, secondary to multiple episodes of nausea, vomiting. We would therefore start the patient on IV Keppra at this time and if these are persistent would obtain a neurology consult. We will replace magnesium, put her on seizure precautions and also order Ativan p.r.n. for seizure activity. I would also obtain an EEG. In regards to the nausea, vomiting, I do not think that this is gastroparesis but cannot get more information. She had a negative gastric emptying study middle of last year and if she has persistent symptoms, perhaps we can consult with gastroenterology and the test could be repeated. I would start her on some gentle fluids, Zofran for symptomatic relief.  She denies having any diarrhea. Would cut down on the Lantus, check a hemoglobin A1c, start her on heparin for deep vein thrombosis prophylaxis.  The patient is full code.  TOTAL TIME SPENT:  50 minutes.    ____________________________ Vivien Presto,  MD sa:ce D: 05/27/2013 17:13:52 ET T: 05/27/2013 19:07:11 ET JOB#: [id number]  cc: Vivien Presto, MD, <Dictator> Vivien Presto MD ELECTRONICALLY SIGNED 06/24/2013 15:36

## 2014-07-16 NOTE — H&P (Signed)
PATIENT NAME:  Rachael Gray, Rachael Gray MR#:  [id number] DATE OF BIRTH:  Oct 15, 1980  DATE OF ADMISSION:  09/27/2013  PRIMARY CARE PHYSICIAN: Nonlocal.   REFERRING PHYSICIAN: Dr. Beather Arbour  CHIEF COMPLAINT:  Intractable nausea, vomiting, epigastric abdominal pain, recurrent seizures.   HISTORY OF PRESENTING ILLNESS: Patient is a 34 year old African American female with past medical history of insulin-dependent diabetes mellitus, history of seizures on Keppra. Was just recently admitted to the hospital on June 29th with intractable nausea, vomiting, and seizures and was discharged on July 2nd. Her nausea and vomiting was assumed to be from gastroparesis. After going home, she was not feeling well. She was still vomiting and was seen by ED this morning. She was given anti-nausea medication, IV fluids, and she was discharged home after giving Keppra. After going home, patient was still nauseous and vomiting. Had 3 more episodes of seizures at home. Patient seizures were not getting better, so she came into the ED. As patient is still persistently nauseated and vomiting associated with epigastric abdominal pain, she was given IV fluids and also Keppra was loaded and hospitalist team is called to admit the patient. During my examination, the patient is still nauseous and vomiting. She is complaining of epigastric abdominal pain, very uncomfortable to give any history as the patient is persistently nauseous and vomiting. No family members at bedside.   Just reported that she was unable to keep anything down.   PAST MEDICAL HISTORY: Insulin-dependent diabetes mellitus, gastroparesis, chronic history of seizures, migraines, lupus.   PAST SURGICAL HISTORY: Appendectomy, cholecystectomy, C-section.   ALLERGIES: ALLERGIC TO ASPIRIN, DILANTIN, DOXYCYCLINE, AND TYLENOL.  HOME MEDICATIONS:  Tramadol 50 mg p.o. q. 4 hours  as needed, promethazine 12.5 mg p.o. every 6 hours,  metoclopramide 10 mg 1 tablet p.o. 4 times a day,  Keppra 500 mg p.o. b.i.d., insulin Lantus 2 times a day, Humulin sliding scale.   PSYCHOSOCIAL HISTORY: Lives at home with mother, smokes 1 pack a day. Denies alcohol or illicit drug usage.   FAMILY HISTORY: Father had heart problems. Mom is healthy. Grandmother has diabetes mellitus and lupus.   REVIEW OF SYSTEMS:  CONSTITUTIONAL: Denies any fever. Complaining of fatigue. Denies any weight loss or weight gain.  EYES:  Denies any blurry vision, double vision.  EARS, NOSE, AND THROAT:  No tinnitus, ear pain or hearing loss.  RESPIRATORY:  Denies cough, wheezing, COPD or shortness of breath.  CARDIOVASCULAR: No chest pain, palpitations, edema . GASTROINTESTINAL: Complaining of persistent nausea, vomiting, and epigastric abdominal pain.  GENITOURINARY: No dysuria, hematuria, or increased frequency of urination.  ENDOCRINE: Denies any heat or cold intolerance. Denies any sweating. Denies any polyuria, nocturia. Has insulin-dependent diabetes mellitus.  MUSCULOSKELETAL: No joint pain in the neck and back. NEUROLOGICAL:  Denies any vertigo or ataxia. Denies any weakness or numbness.  PSYCHIATRIC: History of anxiety or depression.   PHYSICAL EXAMINATION:  VITAL SIGNS: Temperature 99.1, pulse 80, respirations 16-18, blood pressure 139/90, pulse oximetry is 100% on room air.  GENERAL APPEARANCE: Not in any acute distress. Moderately built and nourished.  HEENT: Normocephalic, atraumatic. Pupils are equally reacting to light and accommodation. No scleral icterus. No conjunctival injection. No sinus tenderness. Dry mucous membranes.  NECK: Supple. No JVD or thyromegaly. Range of motion is intact.  LUNGS: Clear to auscultation bilaterally. No accessory muscle use and no anterior chest wall tenderness on palpation.  CARDIAC: S1 and S2 normal. Regular rate and rhythm. No murmurs.  GASTROINTESTINAL: Soft. Bowel sounds are positive in all  4 quadrants. Positive epigastric tenderness. No rebound tenderness.  No masses felt. No organomegaly.  NEUROLOGIC: Awake, alert, and oriented x 3. Cranial nerves II-XII are grossly intact. Motor and sensory are intact. Reflexes are 2+.  EXTREMITIES: No edema. No cyanosis. No clubbing.  SKIN: Warm to touch. Normal turgor. No rashes. No lesions.  MUSCULOSKELETAL: No joint effusion, tenderness, erythema.  PSYCHIATRIC: Flat mood and affect.   LABORATORY AND IMAGING STUDIES: Glucose 277, BUN 6.  The rest of the BMP is normal. Accu-Chek 257. CBC is normal.   Portable chest x-ray: No active disease.  A 12-lead EKG, normal sinus rhythm at 92 beats per minute, an incomplete right bundle branch block.   ASSESSMENT AND PLAN: A 34 year old African American female presenting to the ED with a chief complaint of intractable nausea, vomiting, and epigastric abdominal pain. Evaluated by ED this morning and was discharged home. The patient had 3 episodes of seizures after she was sent home. The patient was just recently admitted to the hospital on June 29th and discharged home on July 2nd with similar complaints. The patient is reporting that she is unable to keep any food down. She cannot take any medications either.  1. Intractable nausea, vomiting, and epigastric abdominal pain, probably from gastroparesis. Other differential can be acute gastritis versus peptic ulcer disease. We will keep her n.p.o., provide IV fluids breath.  Patient will be given IV Pepcid. So, patient will be given anti-nausea medication. Will alternate Phenergan and Zofran.  If no improvement, we will consider adding Reglan.  2. Epigastric abdominal pain, most probably from acute gastritis versus peptic ulcer disease. If there is no clinical improvement, we will consider GI consult.  3. Continued seizures, Keppra IV was given. We will continue Keppra 500 mg IV q. 12 hours.  It is from noncompliance from intractable nausea and vomiting as patient was unable to take any medications.  4. Neurology consult is  placed.  5. Insulin-dependent diabetes mellitus. The patient is currently n.p.o.  Provide IV fluids and provide her sliding scale insulin.  6. We will provide her gastrointestinal and deep vein thrombosis prophylaxis. Plan of care discussed with the patient.   CODE STATUS:  She is full code. Mom is the medical power of attorney.   TIME SPENT ON ADMISSION: 50 minutes.    ____________________________ Nicholes Mango, MD ag:dd D: 09/27/2013 02:26:00 ET T: 09/27/2013 03:40:14 ET JOB#: [id number]  cc: Nicholes Mango, MD, <Dictator> Nicholes Mango MD ELECTRONICALLY SIGNED 10/09/2013 2:15

## 2014-07-16 NOTE — Discharge Summary (Signed)
PATIENT NAME:  Rachael, Gray MR#:  [id number] DATE OF BIRTH:  [birth year]  DATE OF ADMISSION:  05/29/2013 DATE OF DISCHARGE:  05/30/2013  ADMISSION DIAGNOSIS: Nausea, vomiting,   DISCHARGE DIAGNOSES: 1.  Recurrent nausea and vomiting.  2.  Uncontrolled diabetes.  3.  Seizure disorder.  4.  Electrolyte abnormalities.   CONSULTATIONS: Dr. Rayann Heman from GI.   LABORATORY, DIAGNOSTIC AND RADIOLOGICAL DATA:   1.  Imaging:  The patient had a CT of the abdomen without contrast which showed no evidence of acute pathology.  2.  Discharge sodium 140, potassium 3.2, chloride 109, bicarb 24, BUN 5, creatinine 0.45, glucose 97, magnesium 1.5 which was repleted prior to discharge.   HOSPITAL COURSE:  A 34 year old female who presents with recurrent nausea and vomiting,  has had multiple admissions for the same issue. For further details, please refer to the H and P. 1.  Recurrent nausea and vomiting which is intractable. The patient had a previous negative gastroparesis study in December 2014.  She had EGD  showing gastritis. Her recurrent nausea and vomiting was thought to be possibly due to hyperglycemia. She could have some gastric dysrhythmia or possible cyclic vomiting syndrome or psychogenic. GI was consulted. They felt the patient is probably likely psychogenic in nature. I did refer the patient to [facility] for further evaluation of her intractable nausea and vomiting. She tolerated her liquid diet, advancing to a full liquid diet. If she tolerates this, she may be able to be discharged with plan for soft diet as an outpatient. We did also try Reglan for supportive care.  2.  Diabetes. The patient will continue her outpatient medications and have outpatient followup for poorly-controlled diabetes.  3.  Seizure disorder.  The patient had 2 seizures secondary to not able to take p.o. medications. The patient is now able to tolerate her p.o. medications.  4.  Electrolyte abnormalities which were repleted and  this was secondary to problem #1.   DISCHARGE MEDICATIONS: 1.  Keppra 500 mg b.i.d.  2.  Lantus 30 units at bedtime.  3.  Oxycodone 10 mg q.6 hours p.r.n.  4.  Lantus 15 units at bedtime, in the morning.  5.  Reglan 10 mg 4 times a day before meals and at bedtime.  6.  Nexium 20 mg daily.   DISCHARGE DIET: ADA diet.   DISCHARGE ACTIVITY: As tolerated.   DISCHARGE FOLLOWUP:  One week with medical care in [place].    The patient is medically stable for discharge.   TIME SPENT: 40 minutes.    ____________________________ Donell Beers. Benjie Karvonen, MD spm:cs D: 05/30/2013 12:16:28 ET T: 05/30/2013 20:37:51 ET JOB#: [id number]  cc: Jaysten Essner P. Benjie Karvonen, MD, <Dictator> Donell Beers Jahziah Simonin MD ELECTRONICALLY SIGNED 05/31/2013 14:19

## 2014-07-16 NOTE — Consult Note (Signed)
PATIENT NAME:  Rachael Gray, Rachael Gray MR#:  [id number] DATE OF BIRTH:  12-07-1980  DATE OF CONSULTATION:  06/29/2013  CONSULTING PHYSICIAN:  Leotis Pain, MD  REASON FOR CONSULTATION: Seizure activity.  HISTORY OF PRESENT ILLNESS: A 34 year old pleasant female with past medical history of insulin-dependent diabetes, gastroparesis and history of seizures, who presented to the Emergency Department with multiple seizures while being at home. The patient's first seizure was about 1 to 2 years ago and has been on Keppra since then. Occasionally has seizures, described as generalized tonic-clonic, possibly 1 to 2 times a month. The patient has had increased nausea and vomiting for the past couple days prior to admission and was unable to take her p.o. medications. Currently, back to baseline, tolerating her diet and was started on IV Keppra. Unknown history why she has seizures. She just states that she was started on antiepileptic medications due to seizure. Unclear if there was any workup done.   PAST MEDICAL HISTORY: Includes insulin-dependent diabetes, gastroparesis, history of seizure disorder, migraines and lupus.   PAST SURGICAL HISTORY: Appendectomy, cholecystectomy and C-section.   ALLERGIES: INCLUDE ASPIRIN, DOXYCYCLINE, TYLENOL.   PSYCHOSOCIAL HISTORY: Lives at home with her mom. No smoking or drug use. Denies any EtOH use.   FAMILY HISTORY: No family history of any seizures.   HOME MEDICATIONS: Include promethazine, oxycodone, Keppra 500 mg twice a day, omeprazole.   REVIEW OF SYSTEMS:  CONSTITUTIONAL: Denies any fevers, denies any fatigue. Generalized weakness throughout. EYES: No blurry vision, no double vision. Denies any shortness of breath. No palpitations.  GASTROINTESTINAL: Positive for vomiting and nausea. Denies any abdominal pain. ENDOCRINE: Denies any cold or heat intolerance.  NEUROLOGIC: Denies any weakness on one side of the body compared to the other. History of  seizures.  PSYCHIATRIC: No anxiety or depression.   LABORATORY DATA: Workup has been reviewed.   IMAGING: Has been reviewed.   PHYSICAL EXAMINATION: VITAL SIGNS: Include a temperature of 97, pulse 70, respirations 20, blood pressure 115/75, pulse oximetry 99% on room air.  NEUROLOGIC: The patient is awake, alert and oriented to time, place, location and the name of the hospital. Able to tell me the President of the [place]. Able to tell me how many quarters are in $1.50. Speech appears to be fluent. Cranial nerve examination: Extraocular movements intact. Pupils equal, round and reactive bilaterally. Facial sensation intact. Facial motor intact. Tongue is midline. Uvula elevates symmetrically. Shoulder shrug intact. Motor strength appears to be 5 out of 5, bilateral upper and lower extremities. Coordination: Finger-to-nose intact. Sensation intact to light touch and temperature. Reflexes intact symmetrically. Gait not assessed.   IMPRESSION: This is Rachael Gray, a 34 year old female with history of seizures diagnosed about 1 to 2 years ago, on Keppra, presenting with generalized tonic-clonic seizures x4 that have resolved. She started on IV Keppra after load. Has not been on antiepileptic medication for about 2 days due to nausea and vomiting.  PLAN: Agree with continuation of IV antiepileptics until discharge, which can be switched to p.o. She states she is close to her baseline. No further neurological input while in the hospital. At this point, the patient should follow up with neurologist as outpatient. I am not sure she has one, but should be set up with an appointment either with Dr. Manuella Ghazi or Dr. Melrose Nakayama if  possible specifically to work up the cause and the reason why she is having seizures in a 34 year old. Discharge planning from a neurological standpoint.   Thank you.  It was a pleasure seeing this patient.  ____________________________ Leotis Pain,  MD yz:lb D: 06/29/2013 11:42:24 ET T: 06/29/2013 12:15:29 ET JOB#: [id number]  cc: Leotis Pain, MD, <Dictator> Leotis Pain MD ELECTRONICALLY SIGNED 07/21/2013 11:30

## 2014-07-16 NOTE — Consult Note (Signed)
Brief Consult Note: Diagnosis: adjustment disorder.   Patient was seen by consultant.   Consult note dictated.   Comments: Psychiatry: Patient seen and chart reviewed. Patient was sleepy from medication and also uninterested in interview. Ultimately unable to complete interview. Kept falling asleep and would only whisper while awake. Note dictated but no current rec.  Electronic Signatures: Katharyn Schauer, Madie Reno (MD)  (Signed 04-Dec-15 16:55)  Authored: Brief Consult Note   Last Updated: 04-Dec-15 16:55 by Gonzella Lex (MD)

## 2014-07-16 NOTE — H&P (Signed)
PATIENT NAME:  Rachael Gray, Rachael Gray MR#:  [id number] DATE OF BIRTH:  March 28, 1980  DATE OF ADMISSION:  02/25/2014  PRIMARY CARE PHYSICIAN: Nurse practitioner, Maxcine Ham, from [facility].  HISTORY OF PRESENT ILLNESS: The patient is a 34 year old African American female with past medical history significant for history of LA Grade D reflux esophagitis as per EGD in July 2015. Also suspected gastroparesis and history of multiple presentations with nausea, vomiting, and abdominal pains, also history of seizure disorder and insulin-dependent diabetes mellitus, who presented to the hospital with complaints of recurrent seizures. Apparently the patient was admitted to the Emergency Room yesterday on 11/25/2013, because of recurrent seizures. She admitted to not taking her medications because of nausea, vomiting, and abdominal pains, and she was loaded with 1500 mg of IV Keppra and eventually discharge to home. She sustained 1 more seizure, and presented back to the hospital. Because she was noted to be progressively acidotic, hospitalist services were contacted for admission.   PAST MEDICAL HISTORY: Significant for history of insulin-dependent diabetes mellitus with gastroparesis, history of seizure disorder, noncompliant with medications, medical noncompliance, history of nausea and vomiting and abdominal pain, which was felt to be due to LA Grade D reflux esophagitis by EGD in July 2015, and also erosive gastritis. Migraine headaches, as well as lupus.  PAST SURGICAL HISTORY: Appendectomy, cholecystectomy, as well as C-section.   SOCIAL HISTORY: Lives at home with mother. Smokes approximately 1 pack of cigarettes every day. Denies alcohol or illegal drugs, although the patient's urine drug screen was marijuana.   FAMILY HISTORY: His mother is healthy. The patient's father has heart problems and grandmother has diabetes as well as lupus.    MEDICATIONS: Unknown, but the patient tells me that she  takes Lantus 15 units subcutaneously daily as well as a sliding scale insulin, and Keppra 500 mg twice daily. In the past, apparently, she was on tramadol as needed.   REVIEW OF SYSTEMS:  CONSTITUTIONAL: Very difficult to obtain as the patient is somewhat somnolent. Admits to having some abdominal pains around her navel area, with no alleviating or relieving factors, constant. Also denies fevers, chills, fatigue, weakness, pains, except as mentioned above. No weight loss or gain.  EYES: Denies any blurry vision, double vision, glaucoma, or cataracts.  EARS, NOSE, AND THROAT: Denies any tinnitus, allergies, epistaxis, sinus pain, dentures, difficulty swallowing.  RESPIRATORY: Denies any cough, wheezes, asthma, or COPD.  CARDIOVASCULAR: Denies chest pains, orthopnea, edema, arrhythmias, palpitations or syncope. GASTROINTESTINAL: Admits to nausea, vomiting, and abdominal pains. Denies any rectal bleeding or change in bowel habits.  GENITOURINARY: Denies dysuria, hematuria, frequency, incontinence.  ENDOCRINE: Denies any polydipsia, nocturia, thyroid problems, heat or cold intolerance or thirst.  HEMATOLOGIC: Denies any anemia, easy bruising or bleeding, or swollen glands.  SKIN: Denies any acne, rash, change in moles.  MUSCULOSKELETAL: Denies arthritis, cramps, swelling.  NEUROLOGIC: No numbness, epilepsy or tremor.  PSYCHIATRIC: Denies anxiety, insomnia.   PHYSICAL EXAMINATION:  VITAL SIGNS: On arrival to the hospital, the patient's vital signs were temperature 98.4, pulse 107, respiration was 14, blood pressure 117/102, saturation was 100% on room air.  GENERAL: This is a well-developed, well-nourished African American female in no significant distress, somewhat somnolent and obtunded, sitting on the stretcher.  HEENT: Her pupils are equal, reactive to light. Extraocular movements intact. No icterus or jaundice. Has normal hearing. No pharyngeal erythema. Mucosa is dry.  NECK: No masses. Supple,  nontender. Thyroid is not enlarged. No adenopathy. No JVD or carotid bruits.  Full range of motion.  LUNGS: Clear to auscultation in all fields. No rales, rhonchi, diminished breath sounds or wheezing. No labored inspirations, increased effort, dullness to percussion, overt distress.  CARDIOVASCULAR: S1, S2 appreciated. Rhythm is regular. PMI not lateralized. Chest is nontender to palpation. There are 1+ pedal pulses. No lower extremity edema, calf tenderness or cyanosis was noted.  ABDOMEN: Soft tender diffusely. No rebound or guarding were noted. No hepatosplenomegaly or masses were noted.  RECTAL: Deferred.  MUSCULOSKELETAL: Able to move all extremities. No cyanosis, degenerative joint disease or kyphosis. Gait not tested. SKIN: Did not reveal any rashes, lesions, erythema, nodularity, or induration. It was warm and dry to palpation.  LYMPHATIC: No adenopathy in the cervical region.  NEUROLOGICAL: Cranial nerves grossly intact. Sensory is difficult to obtain, as the patient is very poorly cooperative. No dysarthria or aphasia. The patient is somnolent, poorly cooperative. Memory is not impaired.  LABORATORY DATA: BMP: Glucose level of 292, bicarbonate level 17, anion gap of 19, Alcohol level less than 3. Total protein of 8.3; otherwise, liver enzymes were unremarkable. Urine drug screen was positive for cannabinoids. White blood cell count was elevated to 18.6, hemoglobin was 13.1, platelet count 202,000. Urinalysis: Clear yellow urine, more than 500 glucose, negative for bilirubin, 2+ ketones, specific gravity 1.032, pH was 6.0, 1+ blood, 100 mg /dL protein, negative for nitrites or leukocyte esterase, 1 red blood cell, 1 white blood cell, no bacteria, 1 epithelial cell was noted.   RADIOLOGIC STUDIES: CT scan of the head without contrast, 02/25/2014, was negative.   ASSESSMENT AND PLAN:  1.  Recurrent seizures. Admit the patient to the medical floor. Continue her on Keppra IV, following her  condition. I will also added Ativan as needed. We will continue seizure precautions.  2.  Acidosis. Likely due to seizure, although cannot rule out mild diabetic ketoacidosis. We will continue IV fluids as well as insulin, and will follow her labs.  3.  Insulin-dependent diabetes mellitus. Get hemoglobin A1C. Continue Lantus sliding scale insulin and start the patient on clear liquid diet.  4.  Gastroparesis. Reglan IV every 6 hours as scheduled; also PPI, and following her clinically. Pain medications as needed.  5.  Leukocytosis, likely stress related. Rule out aspiration pneumonitis. Get a chest x-ray.  6.  Marijuana abuse. Will get psychiatrist to see the patient in consultation.  7.  Of note, the patient will have a discussion tobacco abuse issues in the nearest future.   TIME SPENT: 50 minutes.   ____________________________ Theodoro Grist, MD rv:MT D: 02/25/2014 10:06:52 ET T: 02/25/2014 10:28:13 ET JOB#: [id number]  cc: Theodoro Grist, MD, <Dictator> Maxcine Ham, NP - [facility]  Nakia Koble MD ELECTRONICALLY SIGNED 03/01/2014 12:01

## 2014-07-16 NOTE — Consult Note (Signed)
PATIENT NAME:  Rachael Gray, Rachael Gray MR#:  [id number] DATE OF BIRTH:  09-25-1980  DATE OF CONSULTATION:  09/28/2013  REFERRING PHYSICIAN:   CONSULTING PHYSICIAN:  Lucilla Lame, MD  REASON FOR CONSULTATION: Nausea, vomiting and abdominal pain.   HISTORY OF PRESENT ILLNESS: This patient is a 34 year old woman who comes in with a history of diabetes mellitus and a history of seizures. The patient had recently been discharged from the hospital after being seen by Dr. Rayann Heman for nausea and vomiting. The patient also was thought to have her seizures due to the nausea, vomiting and inability to take her seizure medication. The patient was discharged home and states that she never really got better and now comes back with continued nausea and vomiting. Her abdominal pain is also in the upper abdomen and worse with her nausea and vomiting. There is no report of any vomiting up of any blood. The patient had had an upper endoscopy in the past for the same symptoms and was found to have mild gastritis. She also had a gastric emptying study done last year that was normal. There is no report of any unexplained fevers or chills. The patient states that her morphine that she is taking for the pain is making her nausea worse and that she does better with Dilaudid.   PAST MEDICAL HISTORY: Chronic seizures, migraines, lupus, insulin-dependent diabetes mellitus, appendectomy, cholecystectomy, C-section.   ALLERGIES: TYLENOL, DILANTIN, DOXYCYCLINE AND ASPIRIN.   HOME MEDICATIONS: Tramadol, promethazine, metoclopramide, Keppra, insulin.   SOCIAL HISTORY: The patient smokes 1 pack of cigarettes a day. Denies alcohol or drug abuse.   FAMILY HISTORY: Noncontributory.   REVIEW OF SYSTEMS: A 10 point review is negative except for what was stated above.   PHYSICAL EXAMINATION: GENERAL: The patient sitting in bed with an emesis bag in hand, appears to be uncomfortable. VITAL SIGNS: Pulse 86, respirations 16, blood pressure  118/70, pulse oximetry 99%.  HEENT: Normocephalic, atraumatic. Extraocular movements intact. Pupils equally round and reactive to light and accommodation without JVD and without lymphadenopathy.  LUNGS: Clear to auscultation bilaterally.  HEART: Regular rate and rhythm without murmurs, rubs, or gallops.  ABDOMEN: Soft and nondistended without hepatosplenomegaly. Positive bowel sounds. There was tenderness to the abdominal wall on light palpation with 1 finger while flexing the abdominal walls.  EXTREMITIES: Without cyanosis, clubbing, or edema.  NEUROLOGIC: Grossly intact.  SKIN: Without any rashes or lesions.   ANCILLARY SERVICES: CT scan done on admission showed the patient to have no abnormalities, except postsurgical changes from having her gallbladder out.  The patient labs had a low potassium of 3.1 with a normal CBC.   ASSESSMENT AND PLAN: This patient is a 34 year old woman with recurrent nausea, vomiting and abdominal pain. The abdominal pain appears to be musculoskeletal. There has been no cause for her abdominal pain found at past work-ups. The patient has a recent discharge for the same and was sent home without the symptoms completely resolving and now she is back with the same issues. The patient will be set up for an EGD tomorrow. The patient may benefit from a repeat gastric emptying study to see if gastroparesis is present. Should also consider a CT scan of the head for a possible neurological condition that may be causing her her recurrent episodes of nausea and vomiting.   Thank you very much for involving me in the care of this patient. If you have any questions, please do not hesitate to call.  ____________________________ Lucilla Lame, MD dw:sb  D: 09/28/2013 13:04:40 ET T: 09/28/2013 13:30:05 ET JOB#: [id number]  cc: Lucilla Lame, MD, <Dictator> Lucilla Lame MD ELECTRONICALLY SIGNED 09/29/2013 7:09

## 2014-07-16 NOTE — Consult Note (Signed)
Chief Complaint:  Subjective/Chief Complaint Patient tolerating clear liquids today. States she is hungry and would like to advance. No further vomiting.   Brief Assessment:  GEN well developed, well nourished, no acute distress   Respiratory normal resp effort   Additional Physical Exam Alert and orientated times 3   Lab Results: Routine Chem:  09-Jul-15 18:25   Glucose, Serum  136  BUN  5  Creatinine (comp)  0.44  Sodium, Serum 138  Potassium, Serum  3.1  Chloride, Serum 101  CO2, Serum 24  Calcium (Total), Serum 9.2  Anion Gap 13  Osmolality (calc) 275  eGFR (African American) >60  eGFR (Non-African American) >60 (eGFR values <20m/min/1.73 m2 may be an indication of chronic kidney disease (CKD). Calculated eGFR is useful in patients with stable renal function. The eGFR calculation will not be reliable in acutely ill patients when serum creatinine is changing rapidly. It is not useful in  patients on dialysis. The eGFR calculation may not be applicable to patients at the low and high extremes of body sizes, pregnant women, and vegetarians.)  Routine Hem:  09-Jul-15 18:25   WBC (CBC) 11.0  RBC (CBC) 4.38  Hemoglobin (CBC) 12.7  Hematocrit (CBC) 38.0  Platelet Count (CBC) 283  MCV 87  MCH 29.1  MCHC 33.5  RDW 12.5  Neutrophil % 54.4  Lymphocyte % 34.4  Monocyte % 9.2  Eosinophil % 1.2  Basophil % 0.8  Neutrophil # 6.0  Lymphocyte #  3.8  Monocyte #  1.0  Eosinophil # 0.1  Basophil # 0.1 (Result(s) reported on 30 Sep 2013 at 06:51PM.)  Erythrocyte Sed Rate  30 (Result(s) reported on 30 Sep 2013 at 06:51PM.)   Assessment/Plan:  Assessment/Plan:  Assessment Patient feeling better and tolerating clears.   Plan Patient to have softs today and if tolerates it will be discharged home tomorrow as per hospitalist.  Will sing off now. Please call with any questions   Electronic Signatures: WLucilla Lame(MD)  (Signed 12-Jul-15 09:44)  Authored: Chief  Complaint, Brief Assessment, Lab Results, Assessment/Plan   Last Updated: 12-Jul-15 09:44 by WLucilla Lame(MD)

## 2014-07-16 NOTE — Consult Note (Signed)
PATIENT NAME:  Rachael Gray, Rachael Gray MR#:  [id number] DATE OF BIRTH:  [birth year]  DATE OF CONSULTATION:  02/25/2014  REFERRING PHYSICIAN:   CONSULTING PHYSICIAN:  Gonzella Lex, MD  IDENTIFYING INFORMATION AND REASON FOR CONSULTATION: A 34 year old woman with diabetes and recurrent seizures who is admitted to the hospital with acidosis as well as seizures. Consultation requested because of substance abuse and failure to take care of her medication.   HISTORY OF PRESENT ILLNESS: Information obtained from the patient and the chart. The patient was admitted to the hospital with recurrent seizures, also poorly controlled diabetes and possible diabetic acidosis. The patient admitted that she had not taken a couple of doses of her Keppra. She stated that this was because she was sick. The patient was a poor historian and gave very limited information during the consult. She denied that she used any drugs illegally, particularly and specifically, she denied using any marijuana. She stated that she took her medication regularly, but had only missed a couple of doses of Keppra because she was feeling so sick. The patient denied any wish to harm herself or any suicidal ideation. She was not able to give any detailed description of her mood and really not able to give much other useful recent history.   PAST PSYCHIATRIC HISTORY: Does not appear to have a clearcut past psychiatric history. No evidence of previous psychiatric hospitalizations or suicidality. It does not appear that psychiatric illness has been identified in the past as part of her problem.   PAST MEDICAL HISTORY: Seizure disorder on Keppra, insulin-dependent diabetes, history of gastroparesis.   SOCIAL HISTORY: The patient states she lives with her child and her mother. The patient is not currently working. Does not give any other history.   FAMILY HISTORY: Not able to answer.   CURRENT MEDICATIONS: In the hospital she is being treated with  Keppra, Reglan, pantoprazole, insulin, p.r.n. Dilaudid, p.r.n. Ativan, p.r.n. Phenergan.   ALLERGIES: ASPIRIN, DILANTIN, DOXYCYCLINE, TYLENOL.   REVIEW OF SYSTEMS: The patient stated that she was still feeling sick and tired. She was not able to identify her mood. Denied any suicidality. Indicated that she was still having significant amounts of pain. The rest of the review of systems noncontributory.   MENTAL STATUS EXAMINATION: A sick and disheveled -appearing woman, looks her stated age. In a hospital bed. Looks sedated and sleepy when I came in. I was able to arouse her to a brief interaction, but she barely opened her eyes. Made no eye contact. Psychomotor activity very limited. Speech was slurred and minimal in amount. Affect flat. Mood not stated. Thoughts minimal, spoke only a few words, did not answer questions completely. No evidence of delusions. The patient was able to identify the hospital she was in, was not able to answer any other questions about orientation. No other cognitive testing performed or possible. Denied suicidal ideation. Denies hallucinations.   LABORATORY RESULTS: On admission a drug screen was positive for marijuana. Glucose elevated at 272, creatinine low. CK is unremarkable. Pregnancy test negative.   ASSESSMENT: This is a 34 year old woman with chronic illnesses that require daily treatment to avoid potentially life threatening consequences. She has had multiple hospitalizations and, in the last couple of days, has had recurrent hospitalizations with seizures. I was not able to get a very detailed history to say the least. She was able to tell me that she regularly takes her medicine and had only missed a couple doses because of feeling sick. Was not able to  give any other detailed information about her medical treatment. The entire interview was very limited by sedation, which might be related to having recently been given narcotics. The patient denied any substance abuse  but is positive for marijuana. In my experience, quite a few people in the community under recognize marijuana as being a drug of abuse and frequently will not report it or minimize its use.   TREATMENT PLAN: At this point, I have no reason to suggest any specific treatment. The patient can be counseled about the potential negative consequences of marijuana use. Further counseling and social assistance about medication compliance can be done once she is more mentally alert. If psychiatric help is needed over the weekend, please contact psychiatrist on call. If she is still here on Monday, I will attempt  to round on her.   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   SECONDARY DIAGNOSES:   AXIS I: Marijuana abuse, mild.    ____________________________ Gonzella Lex, MD jtc:bm D: 02/25/2014 23:37:38 ET T: 02/26/2014 00:33:33 ET JOB#: [id number]  cc: Gonzella Lex, MD, <Dictator> Gonzella Lex MD ELECTRONICALLY SIGNED 03/06/2014 11:15

## 2014-07-16 NOTE — Consult Note (Signed)
Details:   - CT scan with no etiology.   I suspect a lot of this n/v, and abd pain is psychogenic / functional which will be very difficult to treat.  - cont current management. - consider psych consult  - will check am cortisol to eval for adrenal insuff.  - no further recs at this time.   Electronic Signatures: Arther Dames (MD)  (Signed 07-Mar-15 20:15)  Authored: Details   Last Updated: 07-Mar-15 20:15 by Arther Dames (MD)

## 2014-07-16 NOTE — H&P (Signed)
PATIENT NAME:  Rachael Gray, Rachael Gray MR#:  [id number] DATE OF BIRTH:  [birth year]  DATE OF ADMISSION:  06/29/2013  PRIMARY CARE PHYSICIAN: Nonlocal.   REFERRING PHYSICIAN: Dr. Jacqualine Code.  CHIEF COMPLAINT: Intractable nausea, vomiting and seizures.   HISTORY OF PRESENT ILLNESS: The patient is a 34 year old, pleasant female with a past medical history of insulin-dependent diabetes mellitus, suspected gastroparesis and history of seizures on Keppra, is presenting to the Emergency Room with a chief complaint of four witnessed seizures at home, associated with generalized abdominal pain and nausea, vomiting for the past three days. During my examination, the patient is reporting that she has been vomiting continuously for the past 2 to 3 days, and was unable to keep any p.o. She has a history of seizures and takes Keppra on a daily basis. For the past two days, as she was having nausea and intractable vomiting, she was unable to take her p.o. medicines either. Last night, she had four episodes of seizures, back-to-back, which were witnessed at home. Family members called EMS, and the patient was sent over to the Emergency Room. The patient was complaining of generalized abdominal pain associated with nausea and vomiting for the past three days. At the time of arrival, she was anxious and tearful. She was tachypneic also. Zofran 4 mg was given on her way to the Emergency Room by the EMS. The patient's initial magnesium was extremely low at 1.1, regarding which 4 grams of magnesium sulfate was given in the Emergency Room by the Emergency Room physician. Repeat magnesium level was at 2.5. CAT scan of the abdomen and pelvis with contrast was done, which has revealed no acute abnormalities. Hospitalist team is called to admit the patient. During my examination, the patient is resting comfortably, still complaining of nausea and just requesting for clear liquids. The patient is just complaining of pain in the back of her neck  and head. She thinks she might have traumatized while she was seizing. There was a small lump on the back of the neck, but the patient was able to move her neck from side to side. The patient has reported that her first episode of seizure was during her pregnancy, and she was started on Keppra at that time by the neurologist. Following that, she was not followed up by any neurologist. She is not quite sure why she is having seizures. Denies any diarrhea. No fever. No sick contacts.   PAST MEDICAL HISTORY: Insulin-dependent diabetes mellitus, suspected gastroparesis, history of seizure disorder, migraines, lupus.    PAST SURGICAL HISTORY: Appendectomy, cholecystectomy, C-section.   ALLERGIES: ALLERGIC TO ASPIRIN, DOXYCYCLINE, TYLENOL.   PSYCHOSOCIAL HISTORY: Lives at home with mom. She used to smoke, but quit smoking recently. Denies alcohol or illicit drug usage.   FAMILY HISTORY: Mother was healthy. Dad had some heart problems.   HOME MEDICATIONS: Promethazine 25 mg 1 tablet p.o. q.6 hours, oxycodone 10 mg p.o. q.6 hours, Keppra 500 mg 2 times a day, omeprazole 20 mg 1 capsule p.o. once daily.   REVIEW OF SYSTEMS: CONSTITUTIONAL:  Denies any fever, fatigue, complaining of weakness.  EYES: Denies blurry vision, double vision, eye pain, redness.  ENT: Denies epistaxis, ear pain, hearing loss or discharge.  NECK: Complaining of pain on the back of her neck associated with small swelling.  RESPIRATION: Denies cough, chronic obstructive pulmonary disease, wheezing.  CARDIOVASCULAR: No chest pain, palpitations, syncope.  GASTROINTESTINAL: Complaining of nausea, vomiting. Denies any diarrhea. Complaining of abdominal pain for the past two or three  days. Denies any hematemesis or melena. She has chronic history of gastroparesis.  GENITOURINARY: No dysuria or hematuria.  GYNECOLOGIC AND BREASTS: Denies breast mass or vaginal discharge. Had C-section in the past.  ENDOCRINE: Denies polyuria, nocturia.  Has a chronic history of insulin-dependent diabetes mellitus.  HEMATOLOGIC AND LYMPHATIC: No anemia, easy bruising, bleeding.  INTEGUMENTARY: No acne, rash, lesions.  MUSCULOSKELETAL: Complaining of pain in the back of her neck, associated small lump following intractable seizures. Denies any gout. Denies shoulder pain, knee pain, or hip pain. NEUROLOGIC: Denies vertigo, ataxia. No history of dementia or headaches. Has history of epilepsy.  PSYCHIATRIC: No ADD, OCD, anxiety.   PHYSICAL EXAMINATION: VITAL SIGNS: Temperature 97.8, pulse 77, respirations 16, blood pressure is 110/64, pulse oximetry 99%.  GENERAL APPEARANCE: Not in acute distress. Moderately built and nourished.  HEENT: Normocephalic. Pupils are equally reacting to light and accommodation. No scleral icterus. No conjunctival injection. No sinus tenderness. Dry mucous membranes.  NECK: In the back of the neck, in the occipital area, a small lump is noticed. It is tender to touch, but the patient was able to move her neck in all directions. No JVD.  LUNGS: Clear to auscultation bilaterally. No accessory muscle use and no anterior chest wall tenderness on palpation.  CARDIAC: S1, S2 normal. Regular rate and rhythm. No murmurs.  GASTROINTESTINAL: Soft. Bowel sounds are positive in all four quadrants. Nontender, nondistended. No hepatosplenomegaly. No masses felt.  NEUROLOGIC: Awake, alert, oriented x3. Cranial nerves II through XII are grossly intact. Motor and sensory are intact. Reflexes are 2+.  EXTREMITIES: No edema. No cyanosis. No clubbing.  SKIN: Warm to touch. Normal turgor. No rashes. No lesions.  MUSCULOSKELETAL: No joint effusion. Positive cervical tenderness in the back of the neck. There is small swelling, no bleeding, no discharge noticed. Range of motion of the neck is almost intact but uncomfortable.  PSYCHIATRIC: Normal mood and affect.    LABORATORY AND IMAGING STUDIES: CT of the abdomen and pelvis with contrast: No  acute abnormality seen within the abdomen and pelvis. Minimal calcification along the common iliac arteries bilaterally, advanced for age. CT head is ordered, stat, as the patient is complaining of pain on the back of the head and neck, which is pending at this time. Radiologist has recommended to do that in the next 6 to 12 hours, as the patient already had a CT abdomen and pelvis with contrast in the ER. Other labs: Glucose 264, BUN 6, creatinine 0.61, sodium 140, potassium 3.0, chloride 108, CO2 26, GFR greater than 60. Anion gap 6, calcium 7.9. Magnesium initially 1.1; after replacement, it went up to 2.5. Lipase 150. Serum alcohol level less than 3. Beta-hCG less than 1. LFTs: AST is 7, ALT 10, total protein 6.8, albumin 3.2, bilirubin total 0.2, alkaline phosphatase 55. Tricyclic antidepressants are negative. CBC normal. Wet preparation for Chlamydia trachomatis is negative, gonorrhea is negative. Urinalysis: Hazy in color, greater than 500 glucose, nitrites and leukocyte esterase are negative. A 12-lead EKG: Normal sinus rhythm at 93 beats per minute, normal PR interval, corrected QT is at 450 ms, no acute ST-T wave changes.   ASSESSMENT AND PLAN: A 34 year old female, presenting to the Emergency Room with a two day history of intractable nausea, vomiting and abdominal pain, with evidence of four witnessed seizures at home.  1.  Status epilepticus, completely resolved now. We will admit her to telemetry bed. We will provide her Keppra 500 mg IV q.12 hours. The patient has reported that she  is not following up with neurology. Will put in a neurology consult. A loading dose of Keppra was given in the Emergency Room. We will continue close monitoring of the patient.  2.  Neck pain with headache following status epilepticus. CT head is ordered stat, which is pending at this time. Radiologist has recommended to get CT head done in 6 to 12 hours time frame, as the patient has received CT abdomen and pelvis with  contrast.  3.  Acute gastritis with suspected gastroparesis. The patient will be on clear liquids. We will provide her IV fluids and proton pump inhibitor.  4.  Insulin-dependent diabetes mellitus with suspected gastroparesis. The patient will be on clear liquid diet. While she is on clear liquids, we will provide her IV fluids and sliding scale insulin. The patient could not recall her home medications for diabetes, which need to be reconciled. Currently, the patient is on clear liquids. We will consider resuming the medications after medication reconciliation.  5.  Hypomagnesemia, repleted with 4 grams of magnesium sulfate in the Emergency Room, which is at 2.5 now.  6.  Hypokalemia. Will provide intravenous fluids with potassium. This could  probably from intractable nausea and vomiting.  7.  We will provide her gastrointestinal prophylaxis. Will hold off on the deep vein thrombosis prophylaxis until CT head is done, and consider putting the patient on deep vein thrombosis prophylaxis if CT head is negative.   CODE STATUS: The patient is full code.   Plan of care discussed in detail with the patient. She is aware of the plan.   TOTAL TIME SPENT ON ADMISSION: 50 minutes     ____________________________ Nicholes Mango, MD ag:cg D: 06/29/2013 01:49:01 ET T: 06/29/2013 02:28:08 ET JOB#: [id number]  cc: Nicholes Mango, MD, <Dictator> Nicholes Mango MD ELECTRONICALLY SIGNED 07/15/2013 7:04

## 2014-07-16 NOTE — Consult Note (Signed)
Chief Complaint:  Subjective/Chief Complaint The patient reports feeling better today. She was not able to complete the gastric emptying study due to vomiting. The patient had an EGD with erosive esophagitis.   VITAL SIGNS/ANCILLARY NOTES: **Vital Signs.:   09-Jul-15 16:10  Vital Signs Type Q 8hr  Temperature Temperature (F) 98.2  Celsius 36.7  Temperature Source oral  Pulse Pulse 85  Respirations Respirations 18  Systolic BP Systolic BP 546  Diastolic BP (mmHg) Diastolic BP (mmHg) 70  Mean BP 90  Pulse Ox % Pulse Ox % 100  Pulse Ox Activity Level  At rest  Oxygen Delivery Room Air/ 21 %   Brief Assessment:  GEN well developed, well nourished, no acute distress   Respiratory normal resp effort   Gastrointestinal details normal Soft   Additional Physical Exam Alert and orientated times 3   Lab Results: Pathology:  08-Jul-15 12:00   Pathology Report ========== TEST NAME ==========  ========= RESULTS =========  = REFERENCE RANGE =  PATHOLOGY REPORT  Pathology Report .                               [   Final Report         ]                   Material submitted:         Marland Kitchen ANTRAL BIOPSY .                               [   Final Report         ]                   Pre-operative diagnosis:                                        . NAUSEA, VOMITING .                               [   Final Report         ]                Post-operative diagnosis:                                       . EROSIVE ESOPHAGITIS, GASTRITIS .                               [   Final Report         ]                   ********************************************************************** Diagnosis: ANTRAL BIOPSY: - ANTRAL MUCOSA WITH MILD FOVEOLAR HYPERPLASIA AND MILD CHRONIC GASTRITIS. - NEGATIVE FOR H.PYLORI, DYSPLASIA AND MALIGNANCY. XDB/09/30/2013 ********************************************************************** .                               [   Final Report         ]  Electronically signed:                                     . Lum Babe, MD, Pathologist .                               [   Final Report         ]      Gross description:                                         . Received in a formalin filled container labeled Rosalina Hankey and antral biopsy is a single piece of pink red soft tissue admixed with small amount of white soft material measuring up to 0.5 cm in greatest dimensions. Entirely submitted in one cassette. BMD/KCT .                               [   Final Report         ]                   Pathologist provided ICD-9: 535.50 .                               [   Final Report         ]                   CPT                                                        . 094709               [facility]            No: [id number]           [address], [place], [place] [address]           [place], [place]                                         [phone] [id number]   Result(s) reported on 30 Sep 2013 at 02:51PM.   Assessment/Plan:  Assessment/Plan:  Assessment Nausea, vomiting and abd pain.   Plan The paitent is doing better today. The vomiting is still present but better. The abd pain is muscular from repeated vomiting. Continue supportive care.   Electronic Signatures: Lucilla Lame (MD)  (Signed 09-Jul-15 17:23)  Authored: Chief Complaint, VITAL SIGNS/ANCILLARY NOTES, Brief Assessment, Lab Results, Assessment/Plan   Last Updated: 09-Jul-15 17:23 by Lucilla Lame (MD)

## 2014-07-17 NOTE — H&P (Signed)
PATIENT NAME:  Rachael Gray, Rachael Gray MR#:  [id number] DATE OF BIRTH:  10-16-1980  DATE OF ADMISSION:  03/26/2011  PRIMARY CARE PHYSICIAN: None.   CHIEF COMPLAINT: Nausea and vomiting and back pain.   HISTORY OF PRESENT ILLNESS: This is a 34 year old female who has history of diabetes and seizure disorder. She says she has been off her medications for months because of economic issues. She reports having seizures since then, said she had one last night, although there is none documented. She started nausea and vomiting yesterday. She has been having back pain, especially in the left side. CT scan here reveals no cause although she does have a urinary tract infection and highly suspicious now that she has pyelonephritis.   PAST MEDICAL HISTORY:  1. Seizure disorder.  2. Insulin-dependent diabetes. 3. Endometriosis.   PAST SURGICAL HISTORY:  1. Cesarean section.  2. Laparotomy. 3. Right ovarian removal.    SOCIAL HISTORY: Smokes about a half-pack of cigarettes a day. Does not drink alcohol.   FAMILY HISTORY: Significant for diabetes and cancer.   ALLERGIES: Tylenol.   REVIEW OF SYSTEMS: CONSTITUTIONAL: She has had fever. EYES: No blurred vision. ENT: No hearing loss. CARDIOVASCULAR: No chest pain. PULMONARY: No shortness of breath. GASTROINTESTINAL: She has had abdominal pain. GENITOURINARY: No dysuria. ENDOCRINE: No heat or cold intolerance. INTEGUMENT: No rash. MUSCULOSKELETAL: Occasional joint pain. NEUROLOGIC: She has had seizures. PSYCHIATRIC: No depression.   PHYSICAL EXAMINATION:  VITAL SIGNS: Temperature 95.4, pulse 100, respirations 22, blood pressure 119/65.   GENERAL: This is a well-nourished black female in no acute distress.   HEENT: The pupils are equal, round, reactive to light. Sclerae is not icteric. Oral mucosa is moist. Oropharynx is clear. Nasopharynx is clear.   NECK: Supple. No JVD, lymphadenopathy or thyromegaly.   CARDIOVASCULAR: Regular rate and rhythm. No  murmurs, rubs, or gallops.   LUNGS: Clear to auscultation. No dullness to percussion.   ABDOMEN: Soft, nondistended. Bowel sounds are positive. No hepatosplenomegaly. No masses. There is some mild left CVA tenderness.   EXTREMITIES: There is no edema. No joint deformity.   NEUROLOGIC: Cranial nerves II through XII are intact. She is alert and oriented x4.   SKIN: Moist with no rash.   LABORATORY, DIAGNOSTIC AND RADIOLOGICAL DATA: Urinalysis is nitrite positive. White blood cells 21.5.   ASSESSMENT AND PLAN:  1. Acute pyelonephritis. Will go ahead and start patient on IV antibiotics. Get urine cultures. Since she is unable to hold medications now with nausea and vomiting will go ahead and admit her to the hospital.  2. Nausea and vomiting. Will give her supportive care.  3. Hyperglycemia. She does not have access to medications. Will go ahead and treat her with insulin and get case management involved about access to medications when she is discharged.  4. Seizure disorder. She does claim to have seizures recently. There is none documented. Will get a Dilantin level and go ahead and start her with a small oral loading dose and then regular oral Dilantin, if needed we can load her IV.  5. Insulin-dependent diabetes. She will need diabetic education and access to medications.   TIME SPENT ON ADMISSION: 45 minutes.  ____________________________ Baxter Hire, MD jdj:cms D: 03/26/2011 10:33:16 ET T: 03/27/2011 06:44:32 ET JOB#: [id number]  cc: Baxter Hire, MD, <Dictator> Baxter Hire MD ELECTRONICALLY SIGNED 03/29/2011 10:11

## 2014-07-17 NOTE — H&P (Signed)
PATIENT NAME:  Rachael Gray, Rachael Gray MR#:  [id number] DATE OF BIRTH:  Aug 18, 1980  DATE OF ADMISSION:  04/12/2011  PRIMARY CARE PHYSICIAN: [facility]    CHIEF COMPLAINT: Nausea and dizziness.   HISTORY OF PRESENT ILLNESS: This is a 34 year old female who was admitted earlier this month with some pyelonephritis. She was sent home on Cipro. She also has a seizure disorder and was not taking Dilantin then. She was placed on Dilantin 300 mg at bedtime. Coming in with her symptoms today, Dilantin level was checked and she was found to have Dilantin toxicity and symptomatic from it so we are going to admit her overnight for observation and let the Dilantin get out of her system.   PAST MEDICAL HISTORY:  1. Seizure disorder.  2. Insulin dependent diabetes.  3. Endometriosis. 4. Recent pyelonephritis.   PAST SURGICAL HISTORY:  1. Cesarean section. 2. Laparotomy. 3. Right ovary removal.   ALLERGIES: Doxycycline and Tylenol.   CURRENT MEDICATIONS:  1. Lantus 30 units sub-Q at bedtime.  2. Dilantin ER 300 mg at bedtime.  3. Cipro 500 mg b.i.d., just finished two days ago.   SOCIAL HISTORY: Smokes about a half pack of cigarettes a day. Does not drink alcohol.   FAMILY HISTORY: Significant for cancer and diabetes.   REVIEW OF SYSTEMS: CONSTITUTIONAL: No fever or chills. EYES: No blurred vision. ENT: No hearing loss. CARDIOVASCULAR: No chest pain. PULMONARY: No shortness of breath. GI: She has some nausea. GU: No dysuria. ENDOCRINE: No heat or cold intolerance. INTEGUMENTARY: No rash. MUSCULOSKELETAL: Occasional joint pain. NEUROLOGICAL: Some dizziness.   PHYSICAL EXAMINATION:   VITAL SIGNS: Temperature 96.5, pulse 84, respirations 20, blood pressure 130/60.   GENERAL: This is a well nourished black female in no acute distress.   HEENT: Pupils are equal, round, and reactive to light. Sclerae nonicteric. Oral mucosa is moist. Oropharynx is clear. Nasopharynx is clear.   NECK: Supple. No  JVD, lymphadenopathy, or thyromegaly.   CARDIOVASCULAR: Regular rate and rhythm. No murmurs, rubs, or gallops.   LUNGS: Clear to auscultation. No dullness to percussion. She is not using accessory muscles.   ABDOMEN: Soft, nontender, nondistended. Bowel sounds are positive. No hepatosplenomegaly. No masses.   EXTREMITIES: No edema.   NEUROLOGIC: She has some horizontal nystagmus. Otherwise, cranial nerves II through XII appear to be intact. She has a mild tremor.   SKIN: Moist with no rash.  LABORATORY, DIAGNOSTIC, AND RADIOLOGICAL DATA: Dilantin level was 27. Urinalysis shows no sign of infection.   ASSESSMENT AND PLAN:  1. Dilantin toxicity. She said she had been taking the medication regularly. Either this medication has interacted with the Cipro or it could be that 300 mg is just too high of a dose for her. For now will go ahead and hold her Dilantin medication. Recheck the levels in the morning. Will also monitor on telemetry and do frequent neuro checks.  2. Insulin dependent diabetes. Will continue her on her Lantus and monitor her blood sugars.   TIME SPENT ON ADMISSION: 20 minutes.   ____________________________ Baxter Hire, MD jdj:drc D: 04/12/2011 17:20:42 ET T: 04/12/2011 18:05:18 ET JOB#: [id number]  cc: Baxter Hire, MD, <Dictator> [facility] MD ELECTRONICALLY SIGNED 04/15/2011 9:07

## 2014-07-17 NOTE — Discharge Summary (Signed)
PATIENT NAME:  Rachael Gray, PETTIBONE MR#:  [id number] DATE OF BIRTH:  [birth year]  DATE OF ADMISSION:  04/12/2011 DATE OF DISCHARGE:  04/14/2011  PRIMARY CARE PHYSICIAN: [facility]  REASON FOR ADMISSION: Nausea and dizziness.   DISCHARGE DIAGNOSES:  1. Nausea and dizziness secondary to Dilantin toxicity.  2. Dilantin toxicity.  3. Insulin-dependent diabetes mellitus which is poorly controlled per hemoglobin A1c of 11.3.  4. History of seizure disorder.  5. History of tobacco abuse, ongoing.  6. History of recent pyelonephritis which is treated. 7. History of endometriosis.   CONSULTS: None.   PROCEDURES: None.   LABORATORY, DIAGNOSTIC AND RADIOLOGICAL DATA:  Serum glucose 293 on admission, 136 on the day of discharge.   Hemoglobin A1c 11.3 from 04/16/2011.   LFTs are normal on admission.   Complete metabolic panel normal on admission except for serum glucose elevated at 293.   Serum Dilantin 27 on admission and 16.9 on the day of discharge.   CBC normal on admission.   Urinalysis was benign.  Urine pregnancy test is negative.   BRIEF HISTORY/HOSPITAL COURSE: Patient is a pleasant 34 year old female with past medical history of tobacco abuse, seizure disorder, insulin-dependent diabetes mellitus, endometriosis  and recently diagnosed and treated urinary tract infection versus pyelonephritis who presented to the Emergency Department with complaints of nausea and dizziness. Please see dictated admission history and physical for pertinent details surrounding the onset of this hospitalization. Please see below for further details.  1. Nausea and dizziness felt to be secondary to Dilantin toxicity as evidenced by supratherapeutic and elevated serum Dilantin levels at the time of admission. She was admitted to the medical floor and Dilantin was held and she was hydrated with IV fluids to allow the Dilantin to be flushed from her system. She was also maintained on seizure  precautions. With IV fluids and hydration and after holding Dilantin, her serum Dilantin levels have normalized now to the therapeutic range. She was taking 300 mg at bedtime at home. There may have been some drug interaction with the antibiotics that she was recently prescribed for urinary tract infection and pyelonephritis. Urinary tract infection and pyelonephritis appears to have resolved based off unremarkable urinalysis and she was nontoxic appearing and afebrile and without leukocytosis. In regards to her seizure disorder, she was maintained on seizure precautions and now that her serum Dilantin level has normalized to a therapeutic range her serum Dilantin dose was then decreased from 300 to 200 mg at bedtime. This may need to be further adjusted as an outpatient and she will need to have a repeat serum Dilantin level checked within one week per her primary care physician to which patient was agreeable. She did not experience any witnessed seizures during this hospitalization. As above, once her Dilantin levels had normalized with IV fluids her nausea and dizziness and all of her symptoms have resolved. She has ambulated well on the day of discharge and is without any nausea or dizziness. She is tolerating a normal consistency diet well without any nausea, vomiting or dizziness. For insulin-dependent diabetes mellitus this appears to be poorly controlled per hemoglobin A1c of 11.3 and she will likely need to have her diabetic regimen adjusted as an outpatient with further advancement of her Lantus. She was also given sliding scale insulin in the hospital. She states she is in the process of applying for Medicaid. She states that when she obtains insurance she would like to follow up with an endocrinologist which can be arranged by  her primary care physician.  2. Recently diagnosed urinary tract infection and pyelonephritis, appears to have been treated adequately as there are no signs of urinary tract  infection on urinalysis which was obtained during this hospitalization and she was nontoxic appearing and afebrile and without leukocytosis and was without any dysuria or flank pain or any suprapubic pain.  3. Tobacco abuse. Patient was strongly counseled on the importance of smoking cessation.  4. On 04/14/2011 patient was hemodynamically stable and without any nausea or dizziness and was felt to be stable for discharge home with close outpatient follow up to which patient was agreeable.   DISCHARGE DISPOSITION: Home.   DISCHARGE CONDITION: Improved, stable.   DISCHARGE ACTIVITY: As tolerated.   DISCHARGE DIET: ADA.   DISCHARGE MEDICATIONS:  1. Dilantin 200 mg p.o. at bedtime.  2. Lantus 30 units subcutaneous at bedtime.   DISCHARGE INSTRUCTIONS:  1. Take medications as prescribed.  2. Returned to Emergency Department for recurrence of symptoms.   3. Return to Emergency Department for seizures.   FOLLOW UP INSTRUCTIONS: Follow up with [facility] within 1 to 2 weeks. Patient needs repeat serum Dilantin level checked within one week.   TIME SPENT ON DISCHARGE: Greater than 30 minutes.   ____________________________ Romie Jumper, MD knl:cms D: 04/15/2011 01:18:30 ET T: 04/16/2011 11:04:12 ET JOB#: [id number]  cc: Romie Jumper, MD, <Dictator> [facility]  Romie Jumper MD ELECTRONICALLY SIGNED 04/23/2011 18:58

## 2014-07-17 NOTE — Discharge Summary (Signed)
PATIENT NAME:  Rachael, Gray MR#:  [id number] DATE OF BIRTH:  23-May-1980  DATE OF ADMISSION:  03/26/2011 DATE OF DISCHARGE:  03/28/2011  DIAGNOSES:  1. Possible pyelonephritis. 2. Diabetes. 3. Seizure disorder. 4. Nausea and vomiting, resolved.  5. Smoking.  6. Hyperkalemia.   DISPOSITION: Patient is being discharged home.   FOLLOW UP: Follow up with primary care physician at [facility].   DIET: 1800 calorie ADA diet.   ACTIVITY: As tolerated.   DISCHARGE MEDICATIONS:  1. Lantus 30 units subcutaneously at bedtime.  2. Dilantin ER 300 mg at bedtime.  3. Cipro 500 mg b.i.d. for seven days.  4. Zofran 4 mg ODT q.6 hours p.r.n. nausea, vomiting.  5. Norco 5/325, 1 tablet every six hours p.r.n. severe pain.  LABORATORY, DIAGNOSTIC AND RADIOLOGICAL DATA: Microbiology: Urine culture: No growth in 36 hours. CAT scan of the abdomen and pelvis without contrast showed no acute abnormality. White count 21.5. Normal hemoglobin and platelet count. Hypokalemia which was supplemented. Rest of complete metabolic panel was normal.   HOSPITAL COURSE: Patient is a 34 year old female with history of seizure disorder and diabetes who ran out of her medications about a month ago after her insurance was lost. She presented with nausea, vomiting and left flank pain. She had a white count of 21,000 on admission. She was admitted with a diagnosis of possible acute pyelonephritis. CAT scan of the abdomen and pelvis without contrast showed no acute abnormalities. She was initially treated with IV antibiotics and then switched to oral antibiotics. She had nausea, vomiting on admission therefore she was started on a clear liquid diet which was switched to a regular diet once her presenting symptoms resolved. For her diabetes she was treated with Lantus and Dilantin for her seizure disorder. She had mild hypokalemia which was supplemented. She was counseled about smoking cessation. Overall patient was  symptomatically significantly improved by the time of discharge.   TIME SPENT: 45 minutes.  ____________________________ Cherre Huger, MD sp:cms D: 03/28/2011 16:11:08 ET T: 04/01/2011 08:47:16 ET JOB#: [id number]  cc: Cherre Huger, MD, <Dictator> [facility] Cherre Huger MD ELECTRONICALLY SIGNED 04/01/2011 12:16

## 2014-07-20 NOTE — Discharge Summary (Signed)
PATIENT NAME:  Rachael Gray, Rachael Gray MR#:  [id number] DATE OF BIRTH:  [birth year]  DATE OF ADMISSION:  02/25/2014 DATE OF DISCHARGE:  02/27/2014  ADMITTING PHYSICIAN: Theodoro Grist, MD  DISCHARGING PHYSICIAN: Gladstone Lighter, MD  PRIMARY CARE PHYSICIAN: At [facility].    CONSULTATIONS IN THE HOSPITAL:  1.  Neurology consultation by Leotis Pain, MD 2.  Psychiatric consultation by Gonzella Lex, MD  DISCHARGE DIAGNOSES:  1.  Type 1 diabetes mellitus, presenting with diabetic ketoacidosis.  2.  Seizure disorder with noncompliance and presenting with seizures.  3.  Diabetic gastroparesis.  4.  Chronic abdominal pain.  5.  History of reflex esophagitis.  6.  Migraine headaches.  7.  Hypokalemia.  8.  Tobacco use disorder.   DISCHARGE HOME MEDICATIONS:  1.  Keppra 500 mg p.o. b.i.d.  2.  Lantus 5 units subcutaneously twice a day.  3.  Humulin R injection 4 units 4 times a day before meals and after sliding scale.  4.  Phenergan 25 mg rectal suppository every 6 hours as needed for nausea/vomiting.  5.  Dilaudid 1 mg p.o. every 6 hours p.r.n. for pain.  6.  Reglan 10 mg p.o. 3 times a day before meals for 7 days.   DISCHARGE DIET: Carbohydrate-controlled, 1800 calorie diet.   DISCHARGE ACTIVITY: As tolerated.   FOLLOWUP INSTRUCTIONS: PCP followup in 1 week.   LABORATORY AND IMAGING STUDIES: Prior to discharge, WBC 8.8, hemoglobin 11.6, hematocrit 35.9, platelet count 178,000. Sodium 139, potassium 3.5, chloride 104, bicarbonate 29, BUN 6, creatinine 0.5, glucose 118, and calcium of 8.2. KUB on admission showing nonspecific bowel gas pattern nonobstructive. Chest x-ray showing clear lung fields. No acute cardiopulmonary disease. Urine toxicology screen positive for marijuana when she came in. CT of the head without contrast done for seizure showing normal CT of the brain.   BRIEF HOSPITAL COURSE: Rachael Gray is a 34 year old African American female with past medical  history significant for type 1 diabetes mellitus, history of seizure disorder, history of noncompliance with medications, who presents to the hospital secondary to nausea, vomiting, and also seizures.  1.  Seizures with history of seizure disorder, noncompliant with her medications at home. She said she ran out of [place] and was actively seizing. She was loaded with Keppra, seen by a neurologist, and restarted her on her [place]. She has not had further seizures in the hospital. There was one questionable episode of seizure, when actually the patient called out saying that she was having a seizure and the episode aborted without any medication and she was a little confused after the episode. The neurologist did not believe it was a true seizure. She is being discharged back on her home dose of Keppra. No further EEG or neurological imaging recommended.  2.  Diabetic gastroparesis with severe nausea and vomiting requiring multiple doses of Reglan, Phenergan, and Zofran the hospital. Diet was advanced. She is clinically improving. She is being discharged on standing Reglan for 7 days and Phenergan suppository as needed.  3.  Diabetes mellitus, noncompliance with medications, unable to take p.o. due to her nausea and vomiting on presentation. Mild elevation of her anion gap when she presented, did not require any insulin drip or anything. She is only on low-dose Lantus at home along with sliding scale insulin. Sugars are much improved now.   Her course has been otherwise uneventful in the hospital.   DISCHARGE CONDITION: Stable.   DISCHARGE DISPOSITION: Home.   TIME SPENT ON DISCHARGE: 40  minutes.   ____________________________ Gladstone Lighter, MD rk:ts D: 02/27/2014 12:38:00 ET T: 02/27/2014 18:26:52 ET JOB#: [id number]  cc: Gladstone Lighter, MD, <Dictator> [place] MD ELECTRONICALLY SIGNED 03/29/2014 13:41

## 2014-07-24 NOTE — Consult Note (Signed)
Since I am no longer a Medicaid Participating Physician, I cannot bill for this visit or take the patient for long term care. This is a consult only. This is the case of a 34 y/o African-American female patient with a Medical History significant for diabetes, and diabetic gastroparesis, as well as seizures and chronic abdominal pain of uncertain etiology. She also indicates having Lupus. She was recently admited to the Hospital due to Acute Generalized Abdominal Pain after a single episode of uncontrolled diarrhea, followed with several episodes of vomiting, with eventually culminated with an episode of vomiting with some bright red blood. As I evaluated the patient, we presents with a relatively rhythmic movement disorder (dyskinesia), which the family identified as seizures. However, they do not appear to be tonic-clonic seizures, and in addition, the patient was able to answer questions and talk normally as they were occurring. Therefore, it is more likely to be a type of dyskinesia, as oppossed to seizures. Upon reviewing her current medications, the following possible Drug-to-drug interaction was identified:  Metoclopramide injection-Promethazine injection Start Date: 14-Jun-2014 13:43     Status: Active Order CONTRAINDICATED:  Coadministration of metoclopramide with phenothiazines, neuroleptics, or other antidopaminergic agents (e.g., tetrabenazine) may increase the frequency and severity of extrapyramidal reactions (i.e., acute dystonic reactions, tardive dyskinesia, akathisia, Parkinson-like symptoms) due to additive antidopaminergic effects.  By itself, metoclopramide can cause acute dystonic reactions in approximately 0.2% of patients treated with the usual adult dosages of 30 to 40 mg/day.  These reactions are typically seen during the first 24 to 48 hours of treatment, occur more frequently in pediatric and adult patients less than 106 years of age, and are increased with higher dosages.  Symptoms may  include involuntary movements of limbs, facial grimacing, torticollis, oculogyric crisis, rhythmic protrusion of tongue, bulbar type of speech, trismus, opisthotonus (tetanus-like reactions), and rarely, stridor and dyspnea due to laryngospasm.  Dystonic reactions usually respond to treatment with anticholinergic agents such as diphenhydramine or benztropine.  Tardive dyskinesia (TD) is a potentially irreversible and disfiguring disorder characterized most frequently by involuntary movements of the tongue, face, mouth, or jaw, and less frequently by involuntary movements of the trunk and/or extremities.  Movements may be choreoathetotic in appearance.  Although the risk of TD with metoclopramide has not been extensively studied, a prevalence of 20% has been reported in one study among patients treated for at least 12 weeks.  The risk is increased in the elderly, women, and diabetic populations; however, it is not possible to predict which patients will develop TD.  Both the risk of developing TD and the likelihood that TD will become irreversible increase with duration of treatment and total cumulative dose.  There is no known effective treatment.  In some patients, TD may remit, partially or completely, within several weeks to months after metoclopramide is withdrawn.  Akathisia, or motor restlessness, consist of feelings of anxiety, agitation, jitteriness, and insomnia, as well as inability to sit still, pacing, and foot tapping.  Symptoms may disappear spontaneously or respond to a reduction in dosage.  Parkinsonian-like symptoms may include bradykinesia, tremor, cogwheel rigidity, and mask-like facies.  These symptoms most commonly occur within the first 6 months of metoclopramide therapy and subside within 2 to 3 months following drug discontinuation.  MANAGEMENT:  Due to the potential for increased risk of serious and potentially irreversible extrapyramidal reactions, metoclopramide should not be prescribed  in combination with other antidopaminergic agents.  In addition, metoclopramide should not be used for longer than 12 weeks  except in rare cases where therapeutic benefit is anticipated to outweigh the risk of developing tardive dyskinesia. terms of her pain, they indicate that she takes Dilaudid (Hydromorphone) 10 mg 1/2 a tablet qd PRN. When I looked it up in the [facility] Prescription Monitoring Program, it revealed that she actually takes 2 mg, and not 10. She indicates that this works very well. If this is the case, I do not see any problems with allowing her to have: Dilaudid 2 mg, 1/2 to 1 tab PO QD PRN for pain, until the reason for her dyskinesia can be sorted out. I do agree that the descision to stop the Tramadol was a good one. I would stay away from any medication capable of increasing her dopamine, or serotonin levels, until she can be fully evaluated by Neurology. If the oral route is not available, consider using a Fentanyl Patch. I would recommend 12.5 mcg/hr q 3 days.  you for consulting     Electronic Signatures: Noberto Retort (MD) (Signed on 23-Mar-16 19:29)  Authored   Last Updated: 23-Mar-16 19:37 by Noberto Retort (MD)

## 2014-07-24 NOTE — Consult Note (Signed)
Brief Consult Note: Diagnosis: as  above.   Comments: spoke with Dr Gustavo Lah- will plan for UGI/SBFT, hemoccult cards, plan for cspy if pos.  Electronic Signatures: Stephens November H (NP)  (Signed 24-Mar-16 19:54)  Authored: Brief Consult Note   Last Updated: 24-Mar-16 19:54 by Theodore Demark (NP)

## 2014-07-24 NOTE — Consult Note (Signed)
PATIENT NAME:  Rachael Gray, Rachael Gray MR#:  [id number] DATE OF BIRTH:  11-22-1980  DATE OF CONSULTATION:  06/14/2014  REFERRING PHYSICIAN:   CONSULTING PHYSICIAN:  Gonzella Lex, MD  IDENTIFYING INFORMATION AND REASON FOR CONSULT: This is a 34 year old woman with a history of chronic recurrent abdominal pain and diabetes. Consult is for possible pseudoseizures.   HISTORY OF PRESENT ILLNESS: Information from the patient and the chart. The patient was noted on admission to the hospital this time as having spells of generalized shaking during which she was able to converse and interact with physicians and staff. This has been observed several times during her hospitalization. Neurology has observed this and feels that it does not appear to be a seizure-like phenomenon. In interview today the patient says that she is feeling bad primarily from her pain. She says the pain in her stomach and is very agonizing. Also indicates that she has pain in her throat from throwing up. She is feeling sick to her stomach. She feels that her current pain medication has not been adequate. Prior to coming into the hospital she says that she had not been feeling particularly depressed. Denies problems with sleep except insofar as her abdominal pain had been keeping her awake. She denies having any suicidal or homicidal thoughts whatsoever. Denies any hallucinations. Much of the rest of the history was compromised because at a certain point the patient became irritated and terminated the interview. She made it very clear to me that she did not think that she had any kind of mental health problem and did not appreciate having a psychiatric consult requested. I did get a chance to talk with her about her spells. I told her that doctors had noticed that she was having shaking spells that did not look like true seizures. The patient demonstrated one of these for me, showing me how her legs would begin shaking and then she would shake all  over her body. We continued to converse during this. She said that she thought that this was a shivering episode such as one might have in the cold. She did not seem to see the importance of distinguishing this particularly from an epileptic-like seizure. The patient very much emphasizes that her main concern is from her abdominal pain.  PAST PSYCHIATRIC HISTORY: From what I can tell this is the third time a psychiatric consult has been called. I saw the patient in the hospital back in December, at which point it seemed to be settled opinion that she had true epileptic seizures. Consult at that time was for substance abuse. I was able to identify then that she used marijuana, but could not identify a clear other substance abuse problem. Subsequently Dr. Franchot Mimes saw the patient on an admission between that one and the current one and at that time felt that the patient probably was having pseudoseizures. Outside of this there is no evidence of a documented history of diagnosed psychiatric problems. No history of psychiatric hospitalization. No history of suicide attempts or violence. It is uncertain whether the patient has a history of abusing medication. It looks like she does have frequent episodes of pain and has presented to the Emergency Room and other acute settings at times for pain medicine. It is not clear however that she really abuses it.  SOCIAL HISTORY: Not able to work. Lives with her mother.   FAMILY HISTORY: Denies any family history of mental illness.   CURRENT MEDICATIONS: On admission she was taking Reglan 10  mg 3 times a day, Keppra 500 mg twice a day, insulin 10 units at bedtime and 4 units 4 times a day short-acting.   ALLERGIES: ASPIRIN, DILANTIN, DOXYCYCLINE, TYLENOL.   REVIEW OF SYSTEMS: The patient complains of abdominal pain, also nausea and vomiting. Mood is described as bad, but not depressed. Denies suicidality. Denies hallucinations. She admits that she gets shaking spells  which she describes as being "chills." The rest of the full review of systems is negative.   MENTAL STATUS EXAMINATION: Acutely ill-looking woman who appears her stated age, interviewed in her hospital room. The patient was initially cooperative, but after a few minutes announced that she did not want to continue the interview and did not think that it was necessary, and requested that we terminate the conversation. During the time we were talking she maintained intermittent eye contact. Psychomotor activity was fidgety and she often looked like she was having spells of pain. Upon my asking about her shaking spells she demonstrated a shivering episode that ran through her body. Her speech was decreased in total amount and quiet. Affect appeared pained. Mood was stated as being not so good. Thoughts appeared to be lucid, if a little concrete. Denies hallucinations. Denied suicidal or homicidal ideation. The patient was alert and oriented x 4, but declined to cooperate with other cognitive testing. Judgment and insight hard to assess.   LABORATORY RESULTS: On admission Tegretol, valproic acid, and phenobarbital levels and Dilantin levels were all negative as would be expected since she does not take any of those medicines. Her glucose was elevated at 259. The rest of the chemistry panel was unremarkable. I do not see that there was any drug test done on admission. Her pregnancy test was negative. Magnesium continues to run low. Blood sugars are getting under better control, but still run in the 200s.   VITAL SIGNS: Currently blood pressure 137/53, respirations 20, pulse 107, temperature 99.1.   ASSESSMENT: This is a 34 year old woman with diabetes, recurrent episodes of abdominal pain of unclear etiology, and seizures with the possibility of pseudoseizures. Neurology notes up to this point had indicated a belief that the patient had a true epileptic disorder from what I can tell and she had been maintained on  Keppra. It was with this admission I think that the shivering spells were noticed. The spell that I noticed was clearly not a seizure. It was volitional and self-limited by the patient. Did not have any characteristic like a seizure. The patient herself did not seem to be wanting to indicate to me that it was a seizure. It is certainly possible that she may still have an epileptic disorder as well as having these spells. Given her lack of any attempt to conceal them from me I do not think that there is any evidence that this is really a manipulative malingering. Also not a conversion disorder. I think these shaking episodes probably can be better seen as a physical expression she uses to show her pain and distress. Therefore I am going to give the primary diagnosis for now as being an adjustment disorder rather than a conversion disorder.   TREATMENT PLAN: No specific treatment plan indicated. The patient clearly does not want to engage in psychiatric treatment. I am still not sure whether there may be some degree of opiate abuse, certainly she wants to be getting more pain medicine than she is currently getting. Discretion about appropriate use of narcotics, I will leave up to the rest of the  team. No initiation of any psychiatric medicine. I will make one more attempt to follow up with her, but if she continues to state that she does not want any intervention from me I will sign off.   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   SECONDARY DIAGNOSES:   AXIS I: Rule out opiate abuse.   AXIS II: Deferred.   AXIS III:  Epilepsy, diabetes, recurrent abdominal pain.     ____________________________ Gonzella Lex, MD jtc:bu D: 06/14/2014 19:45:00 ET T: 06/14/2014 20:12:16 ET JOB#: [id number]  cc: Gonzella Lex, MD, <Dictator> Gonzella Lex MD ELECTRONICALLY SIGNED 06/27/2014 9:58

## 2014-07-24 NOTE — H&P (Signed)
PATIENT NAME:  Rachael Gray, Rachael Gray MR#:  [id number] DATE OF BIRTH:  Jan 31, 1981  DATE OF ADMISSION:  06/14/2014  PRIMARY CARE PHYSICIAN: [place]   CHIEF COMPLAINT: Abdominal pain.   HISTORY OF PRESENT ILLNESS: This is a 34 year old female who presents to the hospital quite often for abdominal pain, nausea and vomiting. She states that she started developing pain in her abdomen, generalized all over, yesterday. Pain described 10/10, stabbing pain. She has been vomiting, 1 episode of diarrhea. She has been very nauseous. She has not been able to rest. She has been tired and she feels miserable. She is having chills. She was recently in the ER yesterday and sent home and came back today. When I walked in the room, she was shaking like a pseudoseizure. I was able to talk to her and tell her to stop shaking and she stopped shaking after a few minutes. The last time she was in the hospital she had similar presentations and the nurses were calling for seizure-like activity, but this is pseudoseizure. Hospitalist services were contacted for further evaluation.   PAST MEDICAL HISTORY: Diabetes, seizure disorder, lupus, possible gastroparesis.   PAST SURGICAL HISTORY: None.   ALLERGIES: ASPIRIN, DILANTIN, DOXYCYCLINE AND TYLENOL   MEDICATIONS: That she is taking on a daily basis include Reglan 10 mg 3 times a day before meals, Keppra 500 mg every 12 hours, insulin glargine 10 units subcutaneous injection at bedtime, Humulin R 4 units 4 times a day before meals and at bedtime. She was given a script of Cipro. Looks like she has an old script for Dilaudid, which I do not think she takes on a regular basis.   SOCIAL HISTORY: No smoking in 2 days. No alcohol. No drug use. Not working currently. lives with her mother.   FAMILY HISTORY: Both parents are healthy.   REVIEW OF SYSTEMS: CONSTITUTIONAL: Positive for chills. No fever. No sweats. Positive for fatigue. No weight gain. No weight loss.  EYES: No  blurry vision.  EARS, NOSE, MOUTH AND THROAT: No hearing loss. No sore throat. No difficulty swallowing.  CARDIOVASCULAR: Positive for chest pain. No palpitations.  RESPIRATORY: Positive for shortness of breath. No cough. No sputum. No hemoptysis.  GASTROINTESTINAL: Positive for nausea. Positive for vomiting. Positive for abdominal pain. One episode of diarrhea. No bright red blood per rectum. No melena.  GENITOURINARY: No burning on urination or hematuria.  MUSCULOSKELETAL: No joint pain or muscle pain.  INTEGUMENT: No rashes or eruptions.  NEUROLOGIC: No fainting or blackouts.  PSYCHIATRIC: Diagnosed in the past with a lot of somatization.  ENDOCRINE: No thyroid problems.  HEMATOLOGIC AND LYMPHATIC: No anemia. No easy bruising or bleeding.   PHYSICAL EXAMINATION: VITAL SIGNS: Last vital signs recorded included a temperature of 98.2, pulse 92, respirations 14, blood pressure 122/72, pulse ox 100% on room air.  GENERAL: When I walked in the room, the patient was shaking, entire body. I was able to talk to her and she answered questions. I knew this was a pseudoseizure. I asked the patient to stop shaking and to concentrate on that. After a few minutes she stopped shaking.  EYES: Conjunctivae and lids normal. Pupils equal, round, and reactive to light. Extraocular muscles intact. No nystagmus.  EARS, NOSE, MOUTH AND THROAT: Tympanic membranes: No erythema. Nasal mucosa: No erythema. Throat: No erythema. No exudate seen. Lips and gums: No lesions.  NECK: No JVD. No bruits. No lymphadenopathy. No thyromegaly. No thyroid nodules palpated.  RESPIRATORY: Lungs clear to auscultation. No use  of accessory muscles to breathe. No rhonchi, rales, or wheeze heard.  CARDIOVASCULAR: S1, S2 normal. No gallops, rubs, or murmurs heard. Carotid upstroke 2+ bilaterally. No bruits.  EXTREMITIES: Dorsalis pedis pulses 2+ bilaterally. No edema of the lower extremity.  ABDOMEN: Soft. As per patient, generalized  tenderness throughout the entire abdomen. No organosplenomegaly. Normoactive bowel sounds. No masses felt.  LYMPHATIC: No lymph nodes in the neck.  MUSCULOSKELETAL: No clubbing, edema, cyanosis.  SKIN: No rashes or ulcers seen.  NEUROLOGIC: Cranial nerves II through XII grossly intact. DTRs 2+ bilateral lower extremities.  PSYCHIATRIC: The patient is oriented to person, place, and time.Marland Kitchen   DIAGNOSTIC DATA: White blood cell count 11, H and H 11.3 and 36.3, platelet count 180,000. Glucose 205, BUN 12, creatinine 0.54, sodium 138, potassium 3, chloride 100, CO2 25, calcium 9.4. Liver function tests normal range. Yesterday's or last nights labs included a potassium of 3.7.   ASSESSMENT AND PLAN: 1.  Generalized abdominal pain with nausea and vomiting. Similar presentations in the past. Possible diabetic gastroparesis. Unsure if this is the diagnosis or not. The last time she was in the hospital she did not get better until IV Dilaudid was given. In the ER, she was told she is not getting IV Dilaudid. Looking back at old studies, back in July 2015, she had a gastric emptying study which she vomited up the egg after a short period of time so this study could not be completed. She has had numerous CAT scans of the abdomen and pelvis that were negative. Conservative management with IV Zofran, standing dose, IM Phenergan, IV Reglan, and IV Toradol if needed for pain. We will try to hold off on narcotics at this point. Will admit as an observation.  2.  Hypokalemia. Will check magnesium and replace that. If low will replace potassium in IV fluids.  3.  Diabetes. We will put on low dose Lantus and sliding scale at this point.  4.  Pseudoseizure. With history of seizure, I will give Keppra IV b.i.d. at this point.  TIME SPENT ON ADMISSION: 50 minutes.    ____________________________ Tana Conch. Leslye Peer, MD rjw:sb D: 06/14/2014 13:56:56 ET T: 06/14/2014 14:32:15 ET JOB#: [id number]  cc: Tana Conch. Leslye Peer,  MD, <Dictator> PCP - [place] MD ELECTRONICALLY SIGNED 06/14/2014 17:33

## 2014-07-24 NOTE — Consult Note (Signed)
Psychiatry: PAtient with abdominal pain. Today reports her pain and nausea are really bad. Not able to eat. and alert and interactive. Shaking all over while still able to hold a rational conversation. Very desperate and child-like affect and thought.I dont think these are "pseudo seizures" but rather just a very primal expression of distress as might be seen in a child. Would not worry about them as a target of treatment. some soothing and supportive counceling. Will follow.disorder  Electronic Signatures: Clapacs, Madie Reno (MD)  (Signed on 23-Mar-16 16:48)  Authored  Last Updated: 23-Mar-16 16:48 by Gonzella Lex (MD)

## 2014-07-24 NOTE — Discharge Summary (Signed)
PATIENT NAME:  Rachael Gray, Rachael Gray MR#:  [id number] DATE OF BIRTH:  09-09-1980  DATE OF ADMISSION:  06/16/2014 DATE OF DISCHARGE:  06/18/2014  DISCHARGE DIAGNOSES:  1. Generalized abdominal pain and severe nausea, vomiting secondary to acute cystitis.  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  3. Hypokalemia.  4. Adjustment disorder.  5. Chronic pain.   CONSULTATIONS:  1. Psychiatry, Gonzella Lex, MD 2. Gastroenterology, Lollie Sails, MD 3. Neurology, Leotis Pain, MD 4. Pain management, Francisco A. Dossie Arbour, MD  CODE STATUS: FULL CODE.    PROCEDURES: None.   IMAGING STUDIES: Upper GI series and small bowel follow-through were normal.   DISCHARGE MEDICATIONS: Humulin R 40 units subcutaneously 4 times a day as needed for sliding scale, metoclopramide 10 mg p.o. 3 times a day before meals.  Insulin Lantus 10 units subcutaneously once daily at bedtime. Fentanyl 12.5 mg patch applied topically and change every 3 days as recommended by Dr. Dossie Arbour. Bactrim 1 tablet p.o. b.i.d. for 5 days. Discontinued Keppra, Cipro and hydromorphone.   DIET:  Carbohydrate control, regular consistency.   ACTIVITY: As tolerated.   FOLLOWUP: With primary care physician in 1 week, pain management in 2 weeks, outpatient psychiatry as well as psychotherapy in 2 weeks, Outpatient Di[facility] Clinic at [facility] in 2 weeks.  BRIEF HISTORY AND PHYSICAL AND HOSPITAL COURSE: The patient is a 34 year old female who came into the ED with a chief complaint of abdominal pain, nausea, vomiting.  The patient was also shaking which looked like a pseudoseizure as reported by the admitting physician. Please review history and physical for details.   HOSPITAL COURSE:  1. Generalized abdominal pain with nausea and vomiting. Initially it was thought to be from diabetic gastroparesis. The patient was given IV fluids, IV Reglan, GI consult was placed. She had upper gastrointestinal series and small bowel  follow-through, they were normal. Urine culture has revealed Enterococcus sensitive to ciprofloxacin and Bactrim. Abdominal pain, nausea and vomiting was thought to be from acute cystitis and well as from diabetic gastroparesis. With IV fluids, her situation significantly improved and she was discharged with p.o. Bactrim.  2. Hypokalemia.  Replaced potassium IV put in the IV and p.o. form. Also, magnesium was given.  3. Pseudoseizures with history of seizures. The patient was placed on Keppra p.o. IV b.i.d. According to psychiatry and neurology note, the patient had history of seizures from her childhood. Also, now apparently she is having pseudoseizures. Neurology, Dr. Irish Elders,  has seen this patient. He has discontinued Keppra, Toradol and tramadol as Toradol and tramadol can cause lowering of the seizure threshold. Keppra was also discontinued to differentiate true seizures by neurology. We had not noticed any true seizures during the hospital course but  several episodes of pseudoseizures were noticed. Dr. Irish Elders has recommended the patient to follow up with the [facility] for the 3 day continuous EEG monitoring to rule out true seizures. Regarding the pseudoseizures, it probably from adjustment disorder. The patient was seen by Dr. Weber Cooks.  At one point it was thought to be from acting out.   Psychotherapy was offered, but patient and her mom has refused psychotherapy.  Mom does not think her daughter needs any counseling at this point of time.  The patient was seen by Dr. Weber Cooks, it is probably from adjustment disorder, just recommended supportive therapy. No treatment plan needed.  Outpatient counseling was recommended. 4. Pain management. The patient was complaining of chronic pain.   The patient was seen by  Dr. Dossie Arbour, pain management, who has recommended fentanyl 12.5 mcg patch to change every 72 hours and outpatient follow-up with pain management.   The hospital course was uneventful.   The patient tolerated fluids and her diet was advanced and decision is made to discharge the patient home under satisfactory condition.   DISCHARGE PHYSICAL EXAMINATION: VITAL SIGNS: Today, temperature 98.2, pulse 84, respirations 16, blood pressure 115/75,  pulse oximetry 98%. GENERAL APPEARANCE: Not in any acute distress. Moderately built and nourished.  HEENT: Normocephalic, atraumatic. Pupils are equally reacting to light and accommodation. No scleral icterus. No conjunctival injection. No sinus tenderness. No postnasal drip. Moist mucous membranes.  NECK: Supple. No JVD. No thyromegaly. Range of motion is intact.  LUNGS: Clear to auscultation bilaterally. No accessory muscle use.   There is no anterior chest wall tenderness on palpation.  CARDIAC: S1, S2 normal. Regular rate and rhythm. No murmur.  GASTROINTESTINAL: Soft. Bowel sounds are positive in all 4 quadrants. Nontender, nondistended. No masses felt.  NEUROLOGIC: Awake, alert, oriented x 3. Cranial nerves II through XII are grossly intact. Motor and sensory are intact. Reflexes are 2+.  EXTREMITIES: No edema. No cyanosis. No clubbing.  SKIN: Warm to touch. Normal turgor. No rashes. No lesions.  MUSCULOSKELETAL: No joint effusion, no tenderness, erythema.  PSYCHIATRIC: Normal mood and affect.  LABORATORY AND IMAGING STUDIES: Urine culture is positive for greater than 100,000 colonies of Enterococcus bacteria sensitive to ciprofloxacin, Bactrim.  Upper GI series and small bowel follow-through are normal. Glucose 117, BUN normal, creatinine 0.42, sodium is normal, potassium 3.4. GFR greater than 68, magnesium 1.7, anion gap is at 7. CBC normal.   The diagnosis and plan of care was discussed in detail with the patient. She verbalized understanding of the plan.   TOTAL TIME SPENT ON THE DISCHARGE: 45 minutes. ____________________________ Nicholes Mango, MD ag:tr D: 06/18/2014 16:29:00 ET T: 06/18/2014 17:16:08  ET JOB#: [id number]  cc: Gonzella Lex, MD Lollie Sails, MD Leotis Pain, MD Primary Care Physician Nicholes Mango MD ELECTRONICALLY SIGNED 06/29/2014 12:18

## 2014-07-24 NOTE — Discharge Summary (Signed)
PATIENT NAME:  Rachael Gray, Rachael Gray MR#:  [id number] DATE OF BIRTH:  March 05, 1981  DATE OF ADMISSION:  04/16/2014 DATE OF DISCHARGE:  04/20/2014  PRIMARY CARE PHYSICIAN: [facility].    FINAL DIAGNOSES: 1.  Acute diabetic gastroparesis.  2.  Seizure disorder.  3.  Hypokalemia and hypomagnesemia.  4.  Type 2 diabetes.   MEDICATIONS ON DISCHARGE: Include Humulin R 4 units 4 times a day before meals and at bedtime and as needed for sliding scale, Reglan 10 mg 3 times a day, promethazine 25 mg every 6 hours as needed for nausea, promethazine rectal suppository every 6 hours as needed for nausea, Keppra 500 mg q. 12 hours, Lantus insulin 10 units subcutaneous injection at bedtime, oxycodone 5 mg 1 tablet every 6 hours as needed for moderate pain, ranitidine 150 mg twice a day, magnesium oxide 400 mg once a day for 7 days, Klor-Con 20 mEq once a day for 5 days.   FOLLOWUP: In 1 to 2 weeks with [facility] medical.   DIET: Low sodium, carbohydrate-controlled diet, regular consistency.   ACTIVITY: As tolerated.   HOSPITAL COURSE: The patient was admitted 04/16/2014 and discharged 04/20/2014. Came in with not feeling well, nausea, vomiting, abdominal pain. The patient was admitted with acute diabetic gastroparesis with epigastric abdominal pain, nausea and vomiting. Was given Reglan IV as needed, nausea and pain medications. For hypokalemia, this was replaced in the IV fluids. The patient did have a seizure the other day prior to coming in and was given IV Keppra.   LABORATORY AND RADIOLOGICAL DATA DURING THE HOSPITAL COURSE: Included a magnesium 1.8, glucose 248, BUN 13, creatinine 0.74, sodium 137, potassium 3.0, chloride 100, CO2 of 23. Liver function tests normal range. White blood cell count 14.5, hemoglobin and hematocrit of 13.3 and 41.4, platelet count of 209,000. Stool for Clostridium difficile was negative. Creatinine upon discharge 0.39, potassium 3.6, magnesium 1.7, glucose 108.   HOSPITAL COURSE PER  PROBLEM LIST:  1.  For the patient's acute gastroparesis, she was not feeling well for a good period of time. It took a long time for her to improve. She was given IV Dilaudid which seemed to help the IV Reglan. Prior to discharge, she was tolerating diet and felt well enough to go home. She has switched back to oral medications.  2.  For her seizure disorder, she was given IV Keppra while here and p.o. Keppra upon going home.  3.  Hypokalemia and hypomagnesemia. These were replaced during the hospital course. IV magnesium prior to discharge, oral magnesium and potassium on discharge.  4.  For her type 2 diabetes, low dose Lantus and sliding scale only prior to meals needed to be given.   TIME SPENT ON DISCHARGE: 35 minutes.    ____________________________ Tana Conch. Leslye Peer, MD rjw:at D: 04/21/2014 13:06:30 ET T: 04/21/2014 18:41:32 ET JOB#: [id number]  cc: Tana Conch. Leslye Peer, MD, <Dictator> Henry MD ELECTRONICALLY SIGNED 04/22/2014 14:43

## 2014-07-24 NOTE — Consult Note (Signed)
PATIENT NAME:  Rachael Gray, Rachael Gray MR#:  [id number] DATE OF BIRTH:  1980-09-20  DATE OF CONSULTATION:  06/16/2014  REFERRING PHYSICIAN:   CONSULTING PHYSICIAN:  Theodore Demark, NP  REASON FOR CONSULTATION: GI consult ordered by Dr. Margaretmary Eddy for evaluation of nausea, vomiting, abdominal pain, gastroparesis, cannot use Reglan.   HISTORY OF PRESENT ILLNESS:  Appreciate consult for a 34 year old African-American woman for evaluation of nausea, vomiting, abdominal pain, possible gastroparesis. Has history of cholecystectomy, possible epilepsy versus pseudoseizures, diabetes, possible lupus, endometriosis. Has had several admissions for abdominal pain, nausea, and vomiting in the past with several EGDs, CAT scans, and evaluation for gastroparesis. Had a CT scan June of 2015 with no acute abnormalities, also in March and April 2015 with no acute findings.  December of 2014 had CT abdomen and pelvis with no acute findings.  January of 2014 demonstrated some pyelonephritis, CT of abdomen and pelvis repeated June of 2014 found improvement in her kidney, both of these CTs had no other acute abnormalities in GI tract. August of 2013 had CT of abdomen and pelvis with cystitis, but no other acute abnormalities. She had negative Helicobacter pylori testing in December of 2014. She has had an EGD in 2011 with gastritis, EGD in January of 2014 with bilious gastritis, another EGD in December of 2014 with normal esophagus, gastritis, normal duodenum. EGD in July of 2015 with some esophagitis and gastritis, at that time she was thought to have musculoskeletal abdominal pain. There has been no dysplasia or malignancy found on her endoscopic procedures. She has had abdominal films December 2014, another 2014, July of 2015, December of 2015, and March of 2016, all with unremarkable findings. She had a gastric emptying study June of 2014 that was normal. This was repeated in July of 2015, however she vomited so the test was  incomplete. She has had 4 CTs of head to evaluate intracranial issues done in October of 2015, December of 2015, July of 2015, and February of 2015, these all had essentially negative results. She has tested positive for cannabis in April, June, July, October, December of 2015 and additionally in January of 2016.  Currently her MET-B and liver panel are unremarkable. Her hemoglobin is 11.6.  Her CBC is otherwise unremarkable. She also had a negative urine pregnancy test here. She has had a psychiatric evaluation for possible narcotic-seeking behavior/possible psychiatric disorder, but this was inconclusive and the patient did not want to continue this. She has had pain management attending during this visit who recommended against dopamine antagonist due to tardive dyskinesia side effects/drug interactions. There was also a note from this physician stating a discrepancy in the amount of Dilaudid that the patient and family reported she was taking and the actual prescription. She denies nonsteroidal anti-inflammatory drugs. States her abdominal pain is mostly in the epigastric area and associated with nausea and vomiting and currently it is the same as it always is. States her last bowel movement was 3 days ago. The patient states no melena or hematochezia, however her family member thought there was some blood in her stool on Monday. Her hemoglobin is 11.6. Her BUN is normal. Her LFTs are normal. Says she is passing flatus well. Nothing makes her pain any better. Tremors make it worse. States her last Pap smear was last year and normal and that she has been taking Reglan, Zantac, Zofran, and intermittent Dilaudid for abdominal complaints at home with variable results. She states frustration over her chronic pain and dyspepsia. Her  grandmother had colon cancer.   PAST MEDICAL HISTORY: Diabetes, questionable seizure disorder, lupus, cholecystectomy, endometriosis.   ALLERGIES: ASPIRIN, DILANTIN, DOXYCYCLINE,  TYLENOL.   MEDICATIONS AT HOME:  Were Reglan 10 mg t.i.d., Keppra 500 mg q. 12 h, Lantus insulin subcutaneous, Humulin R sliding scale.   SOCIAL HISTORY: No tobacco in 2 days. Denies alcohol and illicits. Lives with her mother.  It is notable that she has been recommended to establish care with a primary provider, however she states at this time that she has not done so. When asked who was prescribing her insulin and other medications she said it was the doctors here at [facility].   FAMILY HISTORY: Grandmother with colon cancer.   REVIEW OF SYSTEMS:  Ten systems reviewed, significant for chills fatigue, tremor, anxiety, intermittent shortness of breath, seizures versus pseudoseizures, otherwise unremarkable. It is of note that she has been diagnosed in the past with somatization.   LABORATORY DATA: Most recent laboratories as above. She did have a 2 view of abdomen, x-ray on the 23rd with no evidence of bowel obstruction or free air, no acute findings and prior cholecystectomy.   PHYSICAL EXAMINATION:  VITAL SIGNS: Most recent vitals, temperature 98.4, pulse 72, respiratory rate 18, blood pressure 128/80, SaO2 to 94% on room air.  GENERAL: Flat-appearing young woman, in no acute distress.  HEENT: Normocephalic, atraumatic. Sclerae clear.  NECK: No JVD or lymphadenopathy.  CHEST: Respirations eupneic. Lungs clear.  CARDIAC: S1, S2, RRR. No MRG.  ABDOMEN: Flat. Bowel sounds x 4. Soft. Diffuse generalized tenderness throughout. No guarding or rigidity. No hepatosplenomegaly, masses, rebound tenderness, or peritoneal signs.  MUSCULOSKELETAL: MAEW x 4. No clubbing, cyanosis, or edema.  SKIN: Warm, dry, pink. No erythema, lesion, or rash noted.  NEUROLOGIC: Alert, oriented x 3. Cranial nerves II through XII intact.  PSYCHIATRIC: Flat. Emotionally labile, calms easily at this time  IMPRESSION AND PLAN: Chronic epigastric abdominal pain, chronic dyspepsia. Discussed this with her. Differential diagnosis  includes cannabinoid syndrome, functional pain, dyspepsia, acute on chronic gastritis/esophagitis, abdominal neuropathic pain, other. She is in no acute distress at present. Her abdomen is flat and soft. She may benefit from tertiary opinion. In further discussion with Dr. Gustavo Lah we will assess upper GI series, small bowel follow-through, and Hemoccult cards. If the Hemoccult cards return positive we will plan for a colonoscopy.   Thank you very much for this consult.    These services were provided by Stephens November, MSN, [facility], in collaboration with Loistine Simas, MD, with whom I have discussed this patient in full.    ____________________________ Theodore Demark, NP chl:bu D: 06/17/2014 12:46:00 ET T: 06/17/2014 13:28:36 ET JOB#: [id number]  cc: Theodore Demark, NP, <Dictator> [place] SIGNED 06/24/2014 16:06

## 2014-07-24 NOTE — Consult Note (Signed)
Brief Consult Note: Diagnosis: abdominal pain/nv.   Patient was seen by consultant.   Consult note dictated.   Comments: Appreciate consult for 34 y/o [name] woman for evaluation of NV, abdominal pain, possible gastroparesis, cholecystectomy,  possible epilepsy v. pseudoseizures, DM, lupus, endometriosis.  Has had several admissions for abdominal pain, NV in past, with several EGDs, ct scans, and eval for gastroparesis. Significant findings included esophagitis and gastritis, had  pyelonephritis once. There has been no gastric dysplasia or malignancy, H pylori tests negative. There was no gastric delay 2014, repeat test incomplete 2015.  She has tested positive for cannabis on several occasions over the last year. Has had psych eval for possible narc seeking behaviour/possible psych disorder, but this was inconclusive. Had pain mgmt opinion, who recommended against dopamine antagonists due to TD side effects/drug interactions. States her abdominal pain is mostly in the epigastric area associated with NV, and that it is the same that it always is. Last bm 3d ago, patient states no melena/ hematochezia, however family member thought there was some red in her stools last Monday. Hgb 11, Bun normal. Lfts normal. Says passing flatus well, nothing makes it better, tremors make it worse. Last Pap last year and normal per pt report. Has been taking Reglan, Zantac, Zofran, and intermittent dilaudid for her abdomial complaints at home with variable results. Of note there was a discrepancy reported by Dr Dossie Arbour in the amount of dilaudid pt/family reported she was taking and the actual rx.  Denies NSAIDs. States frustration over her chronic pain and dyspepsia. Grandmother had colon cancer.  Impression/Plan: chronic epigastric abdominal pain, chronic dyspespia. Discussed this with her- ddx includes cannabinoid syndrome, functional pain/dyspepsia, acute on chronic gastritis/esophagitis, abdominal neuropathic  pain, other. She is in no acute distress at present and abdomen flat/soft. May benefit from tertiary opinion- will discuss further interventions with Dr Gustavo Lah-.  Electronic Signatures: Stephens November H (NP)  (Signed 24-Mar-16 19:53)  Authored: Brief Consult Note   Last Updated: 24-Mar-16 19:53 by Theodore Demark (NP)

## 2014-07-24 NOTE — Consult Note (Signed)
PATIENT NAME:  Rachael Gray, ABSHIER MR#:  [id number] DATE OF BIRTH:  12/23/1980  DATE OF CONSULTATION:  06/15/2014  REFERRING PHYSICIAN:   CONSULTING PHYSICIAN:  Leotis Pain, MD  REASON FOR CONSULTATION:  Questionable seizure activity, abdominal pain.  HISTORY OF PRESENT ILLNESS:  A 34 year old female well known to my service.  I have seen her 2 to 3 times in the past with admission with questionable seizures. The patient states that she suspects she has had seizures as infant, was treated on and off. No definite diagnosis of epilepsy.  When she states she has her seizures, she has generalized shaking, but able to communicate throughout the seizures. I have experienced and witnessed multiple of those events while she was in the hospital.  Once the anxiety is relieved, the symptoms improve.  There is no postictal state. No tongue biting. No urinary incontinence. The patient comes in again for abdominal pain.   PAST MEDICAL HISTORY: Diabetes, questionable seizure disorder, lupus, possible gastroparesis.   MEDICATIONS: Reviewed and include aspirin, Dilantin, doxycycline, and Tylenol.   HOME MEDICATIONS: Have been reviewed.   SOCIAL HISTORY: No alcohol use. No drug use, but positive history for smoking.   REVIEW OF SYSTEMS:   GENERAL:  No shortness of breath. No chest pain. Positive abdominal pain diffuse pressure-like, positive history of anxiety.  NEUROLOGICAL EVALUATION: The patient is alert, awake, oriented to time, place, location.  States she is in the hospital for abdominal pain. Speech is fluent. Tongue is midline. Uvula elevates symmetrically. Shoulder shrug intact. Generalized weakness as per patient, 4+/5 bilaterally upper and lower extremities. Coordination: Finger-to-nose intact. Gait could not be assessed due to severe pain.   IMPRESSION: A 34 year old female who presents with severe abdominal pain, nausea and vomiting with questionable seizure episodes.  All these episodes that are  witnessed and described, I am not convinced these are true epileptic seizures, likely nonepileptic or pseudoseizures. The patient has generalized movement of upper and lower extremities. The patient is able to communicate to me throughout these episodes.   PLAN: Discontinue Keppra. Would not treat these as true seizures at this point. Would hold off tramadol, Toradol as they do lower the seizure threshold. The patient does need an admission in the future for an epilepsy center for 3-5 days for video EEG monitoring with provocation to make a true diagnosis of seizures. I do not think it is appropriate for [place] to just put her on antiepileptic medication as we would be feeding into her symptoms.  For pain control, would consider pain management.   Thank you. It was a pleasure seeing this patient.    ____________________________ Leotis Pain, MD yz:sp D: 06/15/2014 12:03:01 ET T: 06/15/2014 12:29:42 ET JOB#: [id number]  cc: Leotis Pain, MD, <Dictator> Leotis Pain MD ELECTRONICALLY SIGNED 06/28/2014 15:40

## 2014-07-24 NOTE — Consult Note (Signed)
Chief Complaint:  Subjective/Chief Complaint Please see full GI consult note.  Patient seen and examined, chart reviewed.  Long discussion with patient in regards to symptoms.  Patioetn had had 5 EGD since 03/2009 for similar sx, with findings of gastritis related to emesis, and once erosive esophagitis.  Negative H pylori.  Multiple abdominal ct uninformative.   Gastric emptying study normal.  Review of UDS indicates positive for cannabis on every admission, patient is a habitual user.  My DDx would include cannabis use syndrome and cyclical vomiting syndrome.  I would recommend treatment with amitryptylline in the evening and avoidance of cannabis.  Would need to discuss with Psych and pain control to reduce interactions of meds with amitryptylline.  Continue scheduled zofran, iv ppi, carafate as these will help the injury to the stomach from repeated emesis. Addition of benzodiazepine (low dose valium) in combination with zofran may be more effective than zofran alone.  Would recommend UGIS with sbft if patietn can tolerate.   Dr Allen Norris covering over the weekend.   VITAL SIGNS/ANCILLARY NOTES: **Vital Signs.:   24-Mar-16 20:54  Vital Signs Type Routine  Temperature Temperature (F) 98.1  Celsius 36.7  Temperature Source oral  Respirations Respirations 16  Systolic BP Systolic BP 032  Diastolic BP (mmHg) Diastolic BP (mmHg) 77  Mean BP 97  Pulse Ox % Pulse Ox % 99  Pulse Ox Activity Level  At rest  Oxygen Delivery Room Air/ 21 %   Electronic Signatures: Loistine Simas (MD)  (Signed 24-Mar-16 21:19)  Authored: Chief Complaint, VITAL SIGNS/ANCILLARY NOTES   Last Updated: 24-Mar-16 21:19 by Loistine Simas (MD)

## 2014-07-24 NOTE — Consult Note (Signed)
Psychiatry: Follow-up for this patient who has had an adjustment disorder with anxiety and agitation.  She was asleep today and I chose not to wake her up.  I was able to speak to her mother who was at bedside.  Mother reports that today is been a much better day.  The trembling and shaking is almost entirely gone.  Pain is much improved. am glad to hear that the trembling has gone away as she has generally gotten better medically which is exactly what I would expect.  I will follow-up as needed.  No other specific treatment her medicine right now.  Electronic Signatures: Masey Scheiber, Madie Reno (MD)  (Signed on 24-Mar-16 17:32)  Authored  Last Updated: 24-Mar-16 17:32 by Gonzella Lex (MD)

## 2014-07-24 NOTE — H&P (Signed)
PATIENT NAME:  Rachael Gray, Rachael Gray MR#:  [id number] DATE OF BIRTH:  1980/06/28  DATE OF ADMISSION:  04/16/2014  PRIMARY CARE PHYSICIAN: [facility].  CHIEF COMPLAINT: Not feeling well.   HISTORY OF PRESENT ILLNESS: This is a 34 year old female with history of diabetes, chronic pain, gastroparesis and seizure disorder. She presents with not feeling good. She was in the ER yesterday, discharged home and came back again not feeling well. Pain all over her body. Her stomach hurts in the upper abdomen, radiating up into her chest, 9.5/10 in intensity, sharp feeling, comes and goes, lasting 5 minutes at a time, going on for the past 4 days. She has been vomiting. No diarrhea. Positive for nausea. She states that she also had a fever. In the ER, she was found to have low potassium and elevated white count. Hospitalist services were contacted for further evaluation.   PAST MEDICAL HISTORY: Type 2 diabetes, chronic pain syndrome, gastroparesis, seizure disorder.   PAST SURGICAL HISTORY: None.   ALLERGIES: Tylenol, doxycycline, aspirin and Dilantin.   MEDICATIONS: Include Keppra 500 mg b.i.d., Lantus 25 units daily, Reglan 5 mg 4 times daily.   SOCIAL HISTORY: Smokes 2 cigarettes per day. No alcohol. No drug use. Currently not working.   FAMILY HISTORY: Unknown. She has not had contact with her family in over 10 years.   REVIEW OF SYSTEMS:  CONSTITUTIONAL: Positive for fever. No chills or sweats. No weight loss. Positive for fatigue and weakness.   EYES: No blurry vision.   EARS, NOSE, MOUTH AND THROAT: No hearing loss. No sore throat. No difficulty swallowing.   CARDIOVASCULAR: Positive for chest pain, sharp in nature.   RESPIRATORY: No shortness of breath. No cough. No sputum. No hemoptysis.   GASTROINTESTINAL: Positive for nausea and vomiting. Positive for abdominal pain. No diarrhea. No constipation. No bright red blood per rectum. No melena.   GENITOURINARY: No burning on  urination. No hematuria.   MUSCULOSKELETAL: Positive for body pains all over, more muscle.   INTEGUMENT: No rashes or eruptions.   NEUROLOGIC: No fainting or blackouts, but did have a seizure the other day.   PSYCHIATRIC: No anxiety or depression.   ENDOCRINE: No thyroid problems.   HEMATOLOGIC AND LYMPHATIC: No anemia, no easy bruising or bleeding.   PHYSICAL EXAMINATION: VITAL SIGNS: On presentation to the ER include temperature 98.5, pulse 112, respirations 18, blood pressure 137/90, pulse oximetry 97% on room air.   GENERAL: No respiratory distress.   EYES: Conjunctivae and lids normal. Pupils equal, round and reactive to light. Extraocular muscles intact. No nystagmus.   EARS, NOSE, MOUTH AND THROAT: Tympanic membranes: No erythema. Nasal mucosa: No erythema. Throat: No erythema, no exudate seen. Lips and gums: No lesions.   NECK: No JVD. No bruits. No lymphadenopathy. No thyromegaly. No thyroid nodules palpated.   LUNGS: Clear to auscultation. No use of accessory muscles to breathe. No rhonchi, rales or wheeze heard.   CARDIOVASCULAR: S1, S2 normal. No gallops, rubs or murmurs heard. Carotid upstroke 2+ bilaterally. No bruits.   EXTREMITIES: Dorsalis pedis pulses 2+ bilaterally. No edema of the lower extremities.   ABDOMEN: Soft. Positive tenderness in the epigastric area. No organomegaly/splenomegaly. Normoactive bowel sounds. No masses felt.   LYMPHATIC: No lymph nodes in the neck.   MUSCULOSKELETAL: No clubbing, edema or cyanosis.   SKIN: No rashes or ulcers seen.   NEUROLOGIC: Cranial nerves 2 through 12 grossly intact. Deep tendon reflexes 2+ bilateral lower extremities.   PSYCHIATRIC: The patient is  oriented to person, place and time.   LABORATORY AND RADIOLOGICAL DATA: Lipase yesterday was 84. White blood cell count today 14.5, hemoglobin and hematocrit 13.3 and 41.1, platelet count of 209,000. Glucose 248, BUN 13, creatinine 0.74, sodium 137, potassium 3.0,  chloride 100, CO2 of 23, calcium 9.7. Liver function tests normal range. Urinalysis 2+ ketones, greater than 500 mg/dL of glucose, greater than 500 mg/dL of protein.   ASSESSMENT AND PLAN: 1. Acute gastroparesis secondary to diabetes with epigastric abdominal pain. Supportive care with intravenous fluid hydration. We will give intravenous Reglan 10 mg intravenous 4 times daily, p.r.n. intravenous pain medications and nausea medications. Will bring in as an observation initially.  2. Hypokalemia we will replace intravenous fluids, check magnesium and if that is low, we will replace also.  3. Seizure disorder the other day. We will give intravenous Keppra while here.  4. Tobacco abuse. Smoking cessation counseling done, 3 minutes by me. No need for nicotine patch since she only smokes 2 cigarettes per day.  TIME SPENT ON ADMISSION: 50 minutes.    ____________________________ Tana Conch. Leslye Peer, MD rjw:TT D: 04/16/2014 13:13:45 ET T: 04/16/2014 14:14:56 ET JOB#: [id number]  cc: Tana Conch. Leslye Peer, MD, <Dictator> [facility] Marisue Brooklyn MD ELECTRONICALLY SIGNED 04/22/2014 14:42

## 2014-07-24 NOTE — Consult Note (Signed)
PATIENT NAME:  Rachael Gray, Rachael Gray MR#:  [id number] DATE OF BIRTH:  May 07, 1980  DATE OF CONSULTATION:  04/18/2014  REASON FOR CONSULTATION: Seizure activity.   HISTORY OF PRESENT ILLNESS: This is a 34 year old female known to my service because of history of seizure disorder, on Keppra 500 mg twice a day, admitted with abdominal pain, gastroparesis. Neurology consulted for shaking episode yesterday. When speaking to the patient, the patient states she felt feverish, and she was shaking, upper and low    er extremities, but was alert throughout the whole episode, and states she felt as they were chills. Currently, back to normal, just complaining of diffuse abdominal pain, which is being treated as gastroparesis per primary team.   PAST MEDICAL HISTORY: Type 2 diabetes, chronic pain syndrome.   PAST SURGICAL HISTORY: None.   ALLERGIES: TALWIN, DOXYCYCLINE, ASPIRIN, DILANTIN.   HOME MEDICATIONS: Keppra 500 mg twice a day, Lantus 25 daily, Reglan 5 mg 4 times a day.   SOCIAL HISTORY: Daily smoker. No drug use.   FAMILY HISTORY: Unknown.   REVIEW OF SYSTEMS: Positive for chills. Positive for generalized weakness. No chest pain. Positive for diffuse abdominal pain. Denies any anxiety and depression.   NEUROLOGICAL: Positive history for seizure episodes.   LABORATORY WORKUP: Has been reviewed.   IMAGING: Reviewed.    NEUROLOGICAL EVALUATION: The patient is alert, awake and oriented to time, place, location, and the reason why she is in the hospital. Facial sensation intact. Facial motor is intact. Tongue is midline. Uvula symmetrically. Extraocular movements are intact. Motor strength appears to be 4+/5 bilaterally upper and lower extremities. Sensation intact to light touch and temperature. Coordination: Finger-to-nose intact. Gait could not be assessed.   IMPRESSION: A 34 year old female with suspected chronic seizure disorder, on Keppra. The patient states takes daily, admitted with  gastroparesis, abdominal pain. The patient states that she had chills yesterday and shaking-like activity. Does not sound like seizure activity. The patient was able to talk throughout the episode, and the patient states these episodes are different than her seizure activity.   PLAN: No further treatment. Continue the same Keppra 500 mg twice a day. No need for EEG or any further imaging from a neurological standpoint. Gastroparesis treatment as per primary team.   Thank you. It was a pleasure seeing this patient. Please call with any questions  ____________________________ Leotis Pain, MD yz:ap D: 04/18/2014 11:52:47 ET T: 04/18/2014 12:11:02 ET JOB#: [id number]  cc: Leotis Pain, MD, <Dictator> Leotis Pain MD ELECTRONICALLY SIGNED 05/03/2014 12:04

## 2014-07-24 NOTE — Discharge Summary (Signed)
PATIENT NAME:  Rachael Gray, Rachael Gray MR#:  [id number] DATE OF BIRTH:  11/08/1980  DATE OF ADMISSION:  04/24/2014 DATE OF DISCHARGE:  04/25/2014  For a detailed note, please see the history and physical done on admission by Dr. Reece Levy.   DIAGNOSES AT DISCHARGE:  1.  Gastroparesis flare. 2.  Chronic abdominal pain.  3.  Diabetes.  4.  History of seizures. 5.  Narcotic-seeking behavior.   CODE STATUS: The patient is being discharged on a carbohydrate-controlled diet.   ACTIVITY: As tolerated.   FOLLOW-UP:  With her primary care physician in the next 1 to 2 weeks.    DISCHARGE MEDICATIONS: Regular insulin 4 units q.i.d. before meals, Reglan 10 mg t.i.d., promethazine 25 mg every 6 hours as needed, promethazine suppository as needed, oxycodone 5 mg every 6 hours as needed, Keppra 5 mg b.i.d. insulin glargine 10 units at bedtime, loratadine 150 mg b.i.d., magnesium oxide 400 mg daily, potassium 20 mEq daily.   CONSULTANTS DURING THE HOSPITAL COURSE: Dr. Irish Elders from neurology.   BRIEF HOSPITAL COURSE: This is a 34 year old female who presented to the hospital with abdominal pain, intractable nausea, vomiting.  1.  Gastroparesis flare. This was likely the cause of patient's intractable nausea, vomiting, abdominal pain. The patient was admitted to the hospital, started on supportive care with IV fluids, antiemetics and pain control with oral and IV narcotics. The patient did show high drug-seeking behavior, therefore her narcotics were quickly tapered.  The patient's clinical symptoms have improved.  She has less nausea and vomiting.  Her diet was slowly advanced from a clear liquid eventually to a regular diet, which she is tolerating without any further evidence of nausea, vomiting or worsening abdominal pain.  At this point, the patient will be discharged home on oral oxycodone and promethazine as stated.  2.  History of seizures. The patient had no acute seizure-type activity in the hospital.  She  had an episode in the hospital, which looked like a seizure, but it was likely anxiety mediated.  Neurology was consulted.  They did not think that the patient was having an acute seizure. She was maintained on her Keppra and she will resume that.  3.  Chronic abdominal pain. The patient was maintained on oxycodone.  She was given some IV Dilaudid for her gastroparesis flare, but that was quickly tapered, as she shows high drug-seeking behavior.  4.  Anxiety. The patient was maintained on some Xanax and she will continue that.   CODE STATUS: The patient is a FULL CODE.   DISPOSITION: She was discharged home.   TIME SPENT: 40 minutes.     ____________________________ Belia Heman. Verdell Carmine, MD vjs:DT D: 04/25/2014 15:40:00 ET T: 04/25/2014 17:10:32 ET JOB#: [id number]  cc: Belia Heman. Verdell Carmine, MD, <Dictator>  Henreitta Leber MD ELECTRONICALLY SIGNED 05/07/2014 12:50

## 2014-07-24 NOTE — H&P (Signed)
PATIENT NAME:  Rachael Gray, Rachael Gray MR#:  [id number] DATE OF BIRTH:  03-07-1981  DATE OF ADMISSION:  04/24/2014  REFERRING DOCTOR: Ahmed Prima, MD  PRIMARY CARE PRACTITIONER: Nonlocal at [facility].   ADMITTING DOCTOR: Juluis Mire, MD    CHIEF COMPLAINT: Nausea, vomiting, and upper abdominal pain for the past 3 days.    HISTORY OF PRESENT ILLNESS: This is a 34 year old female with a history of diabetes mellitus on insulin, gastroparesis, seizure disorder, cyclical nausea and vomiting, recent [facility] admission and d/c from 04/16/2014 to 04/20/2014, who presents with the complaints of nausea and vomiting with associated with upper abdominal pain, ongoing for the past 2-3 days. The patient was recently admitted to [facility] with similar complaints of nausea and vomiting with upper abdominal pain secondary to diabetic gastroparesis, was treated conservatively with IV fluids and IV pain medications and Reglan, and was sent home on 27th of this month. Returns to the Emergency Room with the complaints of ongoing nausea, vomiting with associated epigastric pain, not controlled by her usual medications. In the Emergency Room on arrival, the patient was noted to have evaluated pulse rate of 134 and was found to be dehydrated, and was given IV pain medications and IV antiemetics and IV fluids. Her heart rate kind of improved, but not normalized, still continues to have significant upper abdominal pain with associated ongoing intermittent nausea and vomiting. Hence, hospitalist service was consulted for further evaluation and management. No history of any blood in the vomitus. No diarrhea. Denies any fever, cough. No chest pain. No shortness of breath. No focal weakness or numbness. No dysuria, frequency, urgency.    PAST MEDICAL HISTORY:  1.  Diabetes mellitus on insulin.  2.  Diabetic gastroparesis.  3.  Seizure disorder.  4.  History of cyclical vomiting.  PAST SURGICAL HISTORY:  1.  Appendectomy.  2.   Cholecystectomy.  3.  Cesarean section.    ALLERGIES:  1.  TYLENOL.  2.  DOXYCYCLINE.  3.  ASPIRIN.  4.  DILANTIN.   SOCIAL HISTORY: She is single, currently not working. Smokes about 1-2 cigarettes per day. Denies any alcohol or substance abuse.   FAMILY HISTORY: Unknown, since she has not had any contact with her family in the past 10 years.   HOME MEDICATIONS:  1.  Humulin regular 4 units subcutaneously 4 times a day before meals as needed.  2.  Lantus 10 units subcutaneously once a day at bedtime.  3.  Klor-Con 20 mEq oral tablet 1 tablet orally once a day.  4.  Levetiracetam 500 mg oral tablet 1 tablet orally twice a day.  5.  Magnesium oxide 400 mg oral tablet 1 tablet orally once a day.  6.  Metoclopramide 10 mg oral tablet 1 tablet 3 times a day.  7.  Oxycodone 5 mg oral tablet 1 tablet orally every 6 hours as needed for pain.  8.  Promethazine 25 mg rectal suppository 1 suppository rectally every 6 hours as needed.  9.  Promethazine 25 mg tablet 1 tablet orally every 6 hours as needed for nausea and vomiting.  10.  Ranitidine 150 mg oral capsule 1 capsule orally 2 times a day.   REVIEW OF SYSTEMS:  CONSTITUTIONAL: Negative for fever or chills. She does have some generalized weakness.  EYES: Negative for blurred vision, double vision. No pain. No redness. No discharge.  EARS, NOSE, AND THROAT: Negative for tinnitus, ear pain, hearing loss, epistaxis, nasal discharge, difficulty swallowing.  RESPIRATORY: Negative for cough,  wheezing, dyspnea, hemoptysis, painful respiration. CARDIOVASCULAR: Negative for chest pain, palpitations, dizziness, syncopal episodes, orthopnea, dyspnea on exertion, or pedal edema.  GASTROINTESTINAL: Positive for nausea and vomiting with associated upper abdominal pain as noted in the history of present illness. Negative for any hematemesis. No melena. No diarrhea. No rectal bleeding.  GENITOURINARY: Negative for dysuria, frequency, urgency, or hematuria.   ENDOCRINE: Negative for polyuria, nocturia, heat or cold intolerance.  HEMATOLOGIC AND LYMPHATIC: Negative for anemia, easy bruising, bleeding, or swollen glands.  INTEGUMENTARY: Negative for acne, skin rash, or lesions.  MUSCULOSKELETAL: Negative for chronic arthritis or gout.  NEUROLOGICAL: Negative for focal weakness or numbness. No history of CVA, TIA. History of seizure disorder on oral medications; no recent seizures.  PSYCHIATRIC: Negative for anxiety, insomnia, depression.   PHYSICAL EXAMINATION:  VITAL SIGNS: Temperature 98 degrees Fahrenheit; pulse rate on arrival to the Emergency Department 135, current pulse rate is 110 per minute; respirations 20 per minute; blood pressure 113/93; oxygen saturation 100% on room air.  GENERAL: Well-developed, well-nourished, alert, in mild distress because of upper abdominal pain; otherwise comfortably resting in the bed at this time.  HEAD: Atraumatic, normocephalic.  EYES: Pupils are equal, react to light and accommodation. No conjunctival pallor. No icterus. Extraocular movements intact.   NOSE: No drainage. No lesions.  EARS: No drainage. No external lesions.  ORAL CAVITY: No mucosal lesions. No exudates.  NECK: Supple. No JVD. No thyromegaly. No carotid bruit. Range of motion of neck within normal limits.  RESPIRATORY: Good respiratory effort. Not using accessory muscles of respiration. Bilateral vesicular breath sounds present. No rales or rhonchi.  CARDIOVASCULAR: S1, S2 regular. No murmurs, gallops, or clicks. Peripheral pulses equal at carotid, femoral, and pedal pulses. No peripheral edema.  GASTROINTESTINAL: Abdomen is soft. Mild tenderness in epigastrium. No guarding. No rigidity. No hepatosplenomegaly. Bowel sounds present and equal in all 4 quadrants.  GENITOURINARY: Deferred.  MUSCULOSKELETAL: No joint tenderness or effusion. Range of motion is adequate. Strength and tone equal bilaterally.  SKIN: Inspection within normal limits. No  obvious wounds.  LYMPHATIC: No cervical lymphadenopathy.  VASCULAR: Good dorsalis pedis and posterior tibial pulses.  NEUROLOGIC: Alert, awake, and oriented x 3. Cranial nerves II-XII grossly intact. No sensory deficit. Motor strength is 5/5 in both lower extremities. DTRs 2+ bilateral symmetrical.  PSYCHIATRIC: Alert, awake, and oriented x 3. Judgment, insight adequate. Memory and mood within normal limits.   ANCILLARY DATA:  LABORATORY DATA: Urine pregnancy negative. Serum glucose 245, BUN 5, creatinine 0.76, sodium 139, potassium 3.4, chloride 104, bicarbonate 25, total calcium 9.2, total protein 8.0, albumin 3.7, total bilirubin 0.1, alkaline phosphatase 45, AST 13, ALT 12. Troponin less than 0.02. WBC 9.2, hemoglobin 13.6, hematocrit 41.2, platelet count 221,000.   URINALYSIS: WBC 8, bacteria none.   EKG: Sinus tachycardia with ventricular rate of 112 beats per minute. No acute ST-T changes.   ASSESSMENT AND PLAN: A 34 year old female with a history of insulin-dependent diabetes mellitus, diabetic gastroparesis, chronic pain, seizure disorder, presents with intractable nausea, vomiting, epigastric pain, recent [facility] admission and discharge from 04/16/2014 to 04/20/2014 with the same complaints.   1.  Nausea, vomiting, epigastric pain secondary due to diabetic gastroparesis.   2.  Diabetic gastroparesis.   PLAN: Admit to medical floor. We will keep her n.p.o. except medications. Continue intravenous fluids, intravenous pain medications, intravenous antiemetics. Continue Reglan, proton pump inhibitor, and H2-blocker.  3.  Diabetes mellitus on insulin: The patient on Lantus at home. We will hold off  Lantus for now since the patient is n.p.o. Continue sliding scale insulin and follow blood sugars. Start Lantus once the patient resumes oral feeds.  4.  History of seizure disorder, on Keppra, stable clinically, no seizures: Continue same.  5.  Deep vein thrombosis  prophylaxis: Subcutaneous Lovenox.  6.  Gastrointestinal prophylaxis: Proton pump inhibitor.   CODE STATUS: FULL CODE.   TIME SPENT: 45 minutes.    ____________________________ Juluis Mire, MD enr:bm D: 04/24/2014 01:08:35 ET T: 04/24/2014 01:35:57 ET JOB#: [id number]  cc: Juluis Mire, MD, <Dictator> [facility] Juluis Mire MD ELECTRONICALLY SIGNED 04/24/2014 14:38

## 2014-07-24 NOTE — Consult Note (Signed)
Psychiatry: Follow-up for this patient with adjustment disorder.  Today she says she is feeling much better.  Able to manage clear liquids.  Pain is much improved. review of systems she is not reporting having her shivering spells.  Pain is improved no other new complaints. mental status she is alert and oriented with good eye contact.  Affect still has some immaturity to it.  Overall euthymic however.  Denies suicidal ideation. remains adjustment disorder.  No change to treatment or plan.  Supportive therapy completed.  Electronic Signatures: Clapacs, Madie Reno (MD)  (Signed on 25-Mar-16 17:53)  Authored  Last Updated: 25-Mar-16 17:53 by Gonzella Lex (MD)

## 2014-07-24 NOTE — Consult Note (Signed)
PATIENT NAME:  Rachael Gray, Rachael Gray MR#:  [id number] DATE OF BIRTH:  1980-09-21  DATE OF CONSULTATION:  04/24/2014  REFERRING PHYSICIAN:   CONSULTING PHYSICIAN:  Leotis Pain, MD  REASON FOR CONSULTATION: Seizure activity.  HISTORY OF PRESENT ILLNESS: A 34 year old female known from my service, last seen at [facility] earlier last week, presented with complaints of nausea, vomiting associated with abdominal pain ongoing for the past 2-3 days since discharge. The patient admitted with a similar episode. No cause for bowel abdominal pain was found. Neurology, especially myself, has seen her last week for questionable seizure-like episode. The patient was shaking while talking throughout the episode, which has resolved. Did not think it was seizure activity. This time presents with a similar episode, the patient was talking and had generalized shaking. Currently, she is back to normal, but only complains of generalized abdominal pain.  PAST MEDICAL HISTORY: Diabetes, diabetic gastroparesis, seizure disorder, history of cyclical vomiting.   NEUROLOGICAL EVALUATION: Awake, alert, and oriented to time, place, location and the reason why she is on hospital. Complaining of diffuse abdominal pain. Extraocular movements are intact. Speech is fluent. Sensation intact. Motor is intact. Gait intact.  IMPRESSION: A 34 year old female with abdominal pain, last seen by neurology and myself last week. She has a questionable history of seizure disorder, on Keppra at baseline. At this point with her presentation, I do not think it is seizure related, does not sound like seizure. Would not change any of her antiepileptic medication. Look for her source of abdominal pain, but at the same time, I think the patient was looking for medications. She appears to be pain seeking. No further input. Please call for any further questions.    ____________________________ Leotis Pain,  MD yz:TT D: 04/24/2014 13:27:11 ET T: 04/24/2014 15:26:22 ET JOB#: [id number]  cc: Leotis Pain, MD, <Dictator> Leotis Pain MD ELECTRONICALLY SIGNED 05/03/2014 12:07

## 2014-07-24 NOTE — Consult Note (Signed)
PATIENT NAME:  Rachael Gray, Rachael Gray MR#:  [id number] DATE OF BIRTH:  09-01-1980  DATE OF CONSULTATION:  04/17/2014  CONSULTING PHYSICIAN:  Evangelynn Lochridge K. Amarissa Koerner, MD  SUBJECTIVE: The patient was seen in consultation, room #1.1 at [facility]. The patient is a 34 year old, African-American female who is not employed and last worked a long time ago. The patient is single, never married and lives with her mother. According to the information obtained from the staff, the patient is very manipulative and is attention seeking and calls her "frequent flier." Reports that last night, she was very demanding, asking for help for chest pains and internal pain in the stomach, for which she was given Dilaudid, which calmed her down.   PAST PSYCHIATRIC HISTORY: The patient denies any inpatient psychiatry, denies any suicide attempt, denies being followed by any psychiatrist.   OBJECTIVE: The patient was seen lying comfortably in bed, alert and oriented, cooperative in the beginning but when questions are being asked by undersigned, she got upset, irritable and stated that she came here for help because she has gastroparesis and she has stomach problems. She has seizures, she is in chronic pain and this has been going on for 3 years. The patient became very irritable and she stated that she is not depressed, she is not suicidal and she does not have any mental illness and does not want to proceed with further interview at this time. Insight and judgment guarded. Impulse control poor.  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 Recommend re-evaluation when the patient is eager to get help for her psychiatric problems and confrontation. Meanwhile, after the patient is stabilized, she can be discharged home and given a follow up appointment for counseling where she can vent her feeling and get help for her psychiatric problems.   ____________________________ Wallace Cullens. Franchot Mimes, MD skc:TT D: 04/17/2014  13:24:22 ET T: 04/17/2014 17:17:52 ET JOB#: [id number]  cc: Arlyn Leak K. Franchot Mimes, MD, <Dictator> Dewain Penning MD ELECTRONICALLY SIGNED 04/23/2014 11:58

## 2014-10-18 LAB — CBC WITH DIFFERENTIAL/PLATELET
BASOS ABS: 0 10*3/uL (ref 0.0–0.1)
Basophil %: 0.2 %
Eosinophil #: 0 10*3/uL (ref 0.0–0.7)
Eosinophil %: 0 %
HCT: 36 % (ref 35.0–47.0)
HGB: 11.3 g/dL — AB (ref 12.0–16.0)
LYMPHS ABS: 2 10*3/uL (ref 1.0–3.6)
Lymphocyte %: 14.2 %
MCH: 26.9 pg (ref 26.0–34.0)
MCHC: 31.4 g/dL — ABNORMAL LOW (ref 32.0–36.0)
MCV: 86 fL (ref 80–100)
MONO ABS: 1 x10 3/mm — AB (ref 0.2–0.9)
Monocyte %: 7 %
Neutrophil #: 11.3 10*3/uL — ABNORMAL HIGH (ref 1.4–6.5)
Neutrophil %: 78.6 %
Platelet: 183 10*3/uL (ref 150–440)
RBC: 4.2 10*6/uL (ref 3.80–5.20)
RDW: 14 % (ref 11.5–14.5)
WBC: 14.4 10*3/uL — ABNORMAL HIGH (ref 3.6–11.0)

## 2014-10-18 LAB — COMPREHENSIVE METABOLIC PANEL
ALT: 11 U/L — AB
Albumin: 4.2 g/dL
Alkaline Phosphatase: 37 U/L — ABNORMAL LOW
Anion Gap: 12 (ref 7–16)
BUN: 8 mg/dL
Bilirubin,Total: 0.6 mg/dL
CALCIUM: 8.6 mg/dL — AB
CHLORIDE: 104 mmol/L
CREATININE: 0.58 mg/dL
Co2: 23 mmol/L
GLUCOSE: 127 mg/dL — AB
POTASSIUM: 3 mmol/L — AB
SGOT(AST): 18 U/L
Sodium: 139 mmol/L
Total Protein: 7.2 g/dL

## 2014-10-18 LAB — URINALYSIS, COMPLETE
Bacteria: NONE SEEN
Bilirubin,UR: NEGATIVE
Blood: NEGATIVE
Glucose,UR: >=500 mg/dL (ref 0–75)
Nitrite: NEGATIVE
Ph: 6 (ref 4.5–8.0)
Protein: 30
SPECIFIC GRAVITY: 1.018 (ref 1.003–1.030)

## 2014-11-04 ENCOUNTER — Emergency Department
Admission: EM | Admit: 2014-11-04 | Discharge: 2014-11-04 | Disposition: A | Discharge status: Home / Self Care | Payer: Self-pay | Attending: Emergency Medicine | Admitting: Emergency Medicine

## 2014-11-04 ENCOUNTER — Encounter: Payer: Self-pay | Admitting: Emergency Medicine

## 2014-11-04 DIAGNOSIS — Z720 Tobacco use: Secondary | ICD-10-CM | POA: Insufficient documentation

## 2014-11-04 DIAGNOSIS — R739 Hyperglycemia, unspecified: Secondary | ICD-10-CM

## 2014-11-04 DIAGNOSIS — E1165 Type 2 diabetes mellitus with hyperglycemia: Secondary | ICD-10-CM | POA: Insufficient documentation

## 2014-11-04 DIAGNOSIS — Z794 Long term (current) use of insulin: Secondary | ICD-10-CM | POA: Insufficient documentation

## 2014-11-04 DIAGNOSIS — Z3202 Encounter for pregnancy test, result negative: Secondary | ICD-10-CM | POA: Insufficient documentation

## 2014-11-04 DIAGNOSIS — F445 Conversion disorder with seizures or convulsions: Secondary | ICD-10-CM

## 2014-11-04 DIAGNOSIS — R195 Other fecal abnormalities: Secondary | ICD-10-CM

## 2014-11-04 DIAGNOSIS — R197 Diarrhea, unspecified: Secondary | ICD-10-CM | POA: Insufficient documentation

## 2014-11-04 DIAGNOSIS — R569 Unspecified convulsions: Secondary | ICD-10-CM | POA: Insufficient documentation

## 2014-11-04 HISTORY — DX: Type 2 diabetes mellitus without complications: E11.9

## 2014-11-04 HISTORY — DX: Unspecified convulsions: R56.9

## 2014-11-04 LAB — COMPREHENSIVE METABOLIC PANEL
ALK PHOS: 44 U/L (ref 38–126)
ALT: 9 U/L — ABNORMAL LOW (ref 14–54)
ANION GAP: 10 (ref 5–15)
AST: 14 U/L — ABNORMAL LOW (ref 15–41)
Albumin: 3.7 g/dL (ref 3.5–5.0)
BUN: 8 mg/dL (ref 6–20)
CO2: 20 mmol/L — ABNORMAL LOW (ref 22–32)
CREATININE: 0.5 mg/dL (ref 0.44–1.00)
Calcium: 8.9 mg/dL (ref 8.9–10.3)
Chloride: 106 mmol/L (ref 101–111)
GFR calc Af Amer: >60 mL/min (ref >60)
GFR calc non Af Amer: >60 mL/min (ref >60)
Glucose, Bld: 287 mg/dL — ABNORMAL HIGH (ref 65–99)
POTASSIUM: 3.9 mmol/L (ref 3.5–5.1)
Sodium: 136 mmol/L (ref 135–145)
TOTAL PROTEIN: 6.8 g/dL (ref 6.5–8.1)
Total Bilirubin: <0.1 mg/dL — ABNORMAL LOW (ref 0.3–1.2)

## 2014-11-04 LAB — URINALYSIS COMPLETE WITH MICROSCOPIC (ARMC ONLY)
BILIRUBIN URINE: NEGATIVE
Hgb urine dipstick: NEGATIVE
Ketones, ur: NEGATIVE mg/dL
Nitrite: NEGATIVE
Protein, ur: NEGATIVE mg/dL
Specific Gravity, Urine: 1.028 (ref 1.005–1.030)
pH: 7 (ref 5.0–8.0)

## 2014-11-04 LAB — CBC
HCT: 37.6 % (ref 35.0–47.0)
HEMOGLOBIN: 12.2 g/dL (ref 12.0–16.0)
MCH: 27.9 pg (ref 26.0–34.0)
MCHC: 32.4 g/dL (ref 32.0–36.0)
MCV: 86.4 fL (ref 80.0–100.0)
PLATELETS: 156 10*3/uL (ref 150–440)
RBC: 4.35 MIL/uL (ref 3.80–5.20)
RDW: 14 % (ref 11.5–14.5)
WBC: 7.3 10*3/uL (ref 3.6–11.0)

## 2014-11-04 LAB — PREGNANCY, URINE: PREG TEST UR: NEGATIVE

## 2014-11-04 LAB — GLUCOSE, CAPILLARY: Glucose-Capillary: 235 mg/dL — ABNORMAL HIGH (ref 65–99)

## 2014-11-04 LAB — LIPASE, BLOOD: LIPASE: 49 U/L (ref 22–51)

## 2014-11-04 MED ORDER — SODIUM CHLORIDE 0.9 % IV BOLUS (SEPSIS): 1000.0000 mL | Freq: Once | INTRAVENOUS | Status: DC

## 2014-11-04 MED ORDER — GI COCKTAIL ~~LOC~~
30.0000 mL | Freq: Once | ORAL | Status: AC
  Administered 2014-11-04: 30 mL via ORAL
  Filled 2014-11-04: qty 30

## 2014-11-04 MED ORDER — ONDANSETRON 4 MG PO TBDP
4.0000 mg | ORAL_TABLET | Freq: Once | ORAL | Status: AC
  Administered 2014-11-04: 4 mg via ORAL
  Filled 2014-11-04: qty 1

## 2014-11-04 MED ORDER — INSULIN ASPART 100 UNIT/ML ~~LOC~~ SOLN
8.0000 [IU] | Freq: Once | SUBCUTANEOUS | Status: AC
  Administered 2014-11-04: 8 [IU] via SUBCUTANEOUS
  Filled 2014-11-04: qty 8

## 2014-11-04 NOTE — Discharge Instructions (Signed)
Nonepileptic Seizures  Please follow-up with your doctor this week. Return to the emergency room right away should you have a fever, develops severe abdominal pain, have blood in her stool, feel weak or dizzy, have another "seizure" or other new concerns or symptoms arise. Please make sure to adhere to your insulin regimen and diabetes care.  Nonepileptic seizures are seizures that are not caused by abnormal electrical signals in your brain. These seizures often seem like epileptic seizures, but they are not caused by epilepsy.  There are two types of nonepileptic seizures:  A physiologic nonepileptic seizure results from a disruption in your brain.  A psychogenic seizure results from emotional stress. These seizures are sometimes called pseudoseizures. CAUSES  Causes of physiologic nonepileptic seizures include:   Sudden drop in blood pressure.  Low blood sugar.  Low levels of salt (sodium) in your blood.  Low levels of calcium in your blood.  Migraine.  Heart rhythm problems.  Sleep disorders.  Drug and alcohol abuse. Common causes of psychogenic nonepileptic seizures include:  Stress.  Emotional trauma.  Sexual or physical abuse.  Major life events, such as divorce or the death of a loved one.  Mental health disorders, including panic attack and hyperactivity disorder. SIGNS AND SYMPTOMS A nonepileptic seizure can look like an epileptic seizure, including uncontrollable shaking (convulsions), or changes in attention, behavior, or the ability to remain awake and alert. However, there are some differences. Nonepileptic seizures usually:  Do not cause physical injuries.  Start slowly.  Include crying or shrieking.  Last longer than 2 minutes.  Have a short recovery time without headache or exhaustion. DIAGNOSIS  Your health care provider can usually diagnose nonepileptic seizures after taking your medical history and giving you a physical exam. Your health care  provider may want to talk to your friends or relatives who have seen you have a seizure.  You may also need to have tests to look for causes of physiologic nonepileptic seizures. This may include an electroencephalogram (EEG), which is a test that measures electrical activity in your brain. If you have had an epileptic seizure, the results of your EEG will be abnormal. If your health care provider thinks you have had a psychogenic nonepileptic seizure, you may need to see a mental health specialist for an evaluation. TREATMENT  Treatment depends on the type and cause of your seizures.  For physiologic nonepileptic seizures, treatment is aimed at addressing the underlying condition that caused the seizures. These seizures usually stop when the underlying condition is properly treated.  Nonepileptic seizures do not respond to the seizure medicines used to treat epilepsy.  For psychogenic seizures, you may need to work with a mental health specialist. [place] care will depend on the type of nonepileptic seizures you have.   Follow all your health care provider's instructions.  Keep all your follow-up appointments. SEEK MEDICAL CARE IF: You continue to have seizures after treatment. SEEK IMMEDIATE MEDICAL CARE IF:  Your seizures change or become more frequent.  You injure yourself during a seizure.  You have one seizure after another.  You have trouble recovering from a seizure.  You have chest pain or trouble breathing. MAKE SURE YOU:  Understand these instructions.  Will watch your condition.  Will get help right away if you are not doing well or get worse. Document Released: 04/26/2005 Document Revised: 07/26/2013 Document Reviewed: 01/05/2013 [facility] Patient Information 2015 [place], [place]. This information is not intended to replace advice given to you  by your health care provider. Make sure you discuss any questions you have with your health care  provider.

## 2014-11-04 NOTE — ED Notes (Signed)
Pt arrived a&o via ems.  pts sister called ems for seizure.  EMS reports pt was having seizure activity on their arrival , and was talking to them during it.  No incontinence.

## 2014-11-04 NOTE — ED Notes (Signed)
Pt has hx of seizures, not taking meds due to insurance issue.  On ems arrival pt has shaking but talking and had no incontinence or post ictle period.  States she "may have just been very anxious"  Pt calm and cooperative, skin w/d, PERRL, MAE.

## 2014-11-04 NOTE — ED Provider Notes (Signed)
[facility] Emergency Department Provider Note  ____________________________________________  Time seen: Approximately 9:18 AM  I have reviewed the triage vital signs and the nursing notes.   HISTORY  Chief Complaint Seizures    HPI Rachael Gray is a 34 y.o. female presents today after an episode of a possible "seizure". EMS reports that on arrival the patient was tremble [place], but was speaking to them clearly. Report that she was fully alert and moving all of her extremities, which she is reporting as a possible seizure.  The patient reports that she's been having some nausea, some occasional loose stools over the last week. She is also diabetic. She reports that she has not been vomiting, but was feeling anxious this morning about having to go into work. She reports that she had a bowel movement, walked into her room and began feeling very anxious, she then laid down and began to "shake". She states that her family found her and retention she may be having a seizure.  History he with EMS, the patient was awake and alert for than the entire time. He did not witness any obvious seizure activity, though the patient was shaking when they arrive.  The patient is calm now, she reports not being in any pain. She reports that she is slightly nauseated and having some loose stools that are nonbloody about once a day for the last week. No travel history. No fevers or chills. She denies pregnancy. No pain or burning with urination. She does use insulin on occasion, but reports that she has not regularly using it as she is supposed to. She does have insulin supply at home.   Past Medical History  Diagnosis Date  . Diabetes mellitus without complication   . Seizures     There are no active problems to display for this patient.   Past Surgical History  Procedure Laterality Date  . Appendectomy    . Cholecystectomy      Current Outpatient Rx  Name  Route  Sig   Dispense  Refill  . insulin NPH-regular Human (NOVOLIN 70/30) (70-30) 100 UNIT/ML injection   Subcutaneous   Inject 22-25 Units into the skin 2 (two) times daily with a meal. 25 units in the morning and 22 units in the evening           Allergies Doxycycline and Tylenol  History reviewed. No pertinent family history.  Social History Social History  Substance Use Topics  . Smoking status: Current Every Day Smoker  . Smokeless tobacco: None  . Alcohol Use: Yes    Review of Systems Constitutional: No fever/chills Eyes: No visual changes. ENT: No sore throat. Cardiovascular: Denies chest pain. Respiratory: Denies shortness of breath. Gastrointestinal: No abdominal pain. No vomiting.    No constipation. Genitourinary: Negative for dysuria. Not pregnant. No vaginal bleeding. Musculoskeletal: Negative for back pain. Skin: Negative for rash. Neurological: Negative for headaches, focal weakness or numbness.  Denies watning to hurt herself. She does feel anxious at times, especially around work.  10-point ROS otherwise negative.  ____________________________________________   PHYSICAL EXAM:  VITAL SIGNS: ED Triage Vitals  Enc Vitals Group     BP 11/04/14 0845 129/78 mmHg     Pulse Rate 11/04/14 0845 100     Resp 11/04/14 0845 18     Temp 11/04/14 0845 98.1 F (36.7 C)     Temp Source 11/04/14 0845 Oral     SpO2 11/04/14 0845 99 %     Weight  11/04/14 0845 137 lb (62.143 kg)     Height 11/04/14 0845 5\' 5"  (1.651 m)     Head Cir --      Peak Flow --      Pain Score --      Pain Loc --      Pain Edu? --      Excl. in [place]? --     Constitutional: Alert and oriented. Well appearing and in no acute distress. Eyes: Conjunctivae are normal. PERRL. EOMI. Head: Atraumatic. Nose: No congestion/rhinnorhea. Mouth/Throat: Mucous membranes are moist.  Oropharynx non-erythematous. Neck: No stridor.   Cardiovascular: Normal rate, regular rhythm. Grossly normal heart sounds.   Good peripheral circulation. Respiratory: Normal respiratory effort.  No retractions. Lungs CTAB. Gastrointestinal: Soft and nontender. No distention. No abdominal bruits. No CVA tenderness. Musculoskeletal: No lower extremity tenderness nor edema.  No joint effusions. Neurologic:  Normal speech and language. No gross focal neurologic deficits are appreciated. No gait instability. Skin:  Skin is warm, dry and intact. No rash noted. Psychiatric: Mood and affect are normal. Speech and behavior are normal.  Essentially normal exam at this time.  ____________________________________________   LABS (all labs ordered are listed, but only abnormal results are displayed)  Labs Reviewed  COMPREHENSIVE METABOLIC PANEL - Abnormal; Notable for the following:    CO2 20 (*)    Glucose, Bld 287 (*)    AST 14 (*)    ALT 9 (*)    Total Bilirubin <0.1 (*)    All other components within normal limits  URINALYSIS COMPLETEWITH MICROSCOPIC ([facility] ONLY) - Abnormal; Notable for the following:    Color, Urine YELLOW (*)    APPearance CLEAR (*)    Glucose, UA >500 (*)    Leukocytes, UA TRACE (*)    Bacteria, UA RARE (*)    Squamous Epithelial / LPF 0-5 (*)    All other components within normal limits  GLUCOSE, CAPILLARY - Abnormal; Notable for the following:    Glucose-Capillary 235 (*)    All other components within normal limits  URINE CULTURE  CBC  LIPASE, BLOOD  PREGNANCY, URINE  CBG MONITORING, ED  CBG MONITORING, ED   ____________________________________________  EKG   ____________________________________________  RADIOLOGY   ____________________________________________   PROCEDURES  Procedure(s) performed: None  Critical Care performed: No  ____________________________________________   INITIAL IMPRESSION / ASSESSMENT AND PLAN / ED COURSE  Pertinent labs & imaging results that were available during my care of the patient were reviewed by me and considered in my medical  decision making (see chart for details).  Patient transferred chief complaint of possible seizure. In discussion with EMS, and the patient as well as review of previous medical records does not appear that the patient had a true organic seizure, this likely represents an episode of anxiety presenting as a possible pseudoseizure given that the patient was witnessed to have this tremulous activity, while speaking and alert with EMS. She has a history of pseudoseizures.  She reports feeling very anxious about her work, in addition she's been having some slight nausea and diarrhea for about one week. Her abdominal exam and physical exam are very reassuring. Neurologically intact. We will obtain basic abdominal labs, urinalysis, and continue to observe her in the ER.  ----------------------------------------- 10:47 AM on 11/04/2014 -----------------------------------------  Patient resting conflict. No distress. No vomiting or further episodes of shaking in the ER. Her glucose is elevated, she reports she has not used her insulin today. I'll give her 8 units  of insulin. No evidence of DKA. Bicarbonate is noted at 20, however this is likely in combination due to some slight dehydration as well as may be associated with slight hyperglycemia. She does not have any ketonuria.  Once glucose is controlled, anticipate discharge to home.  ----------------------------------------- 12:01 PM on 11/04/2014 -----------------------------------------  Patient reports she feels well and would like to be discharged. On reevaluation she is awake alert no distress. Repeat abdominal exam is soft nontender and nondistended. No emesis or diarrhea in the ER. Afebrile with reassuring vital signs. Reinforce importance of adherence to her insulin regimen, patient agreeable. Discharged home. Return precautions discussed. ____________________________________________   FINAL CLINICAL IMPRESSION(S) / ED DIAGNOSES  Final  diagnoses:  Hyperglycemia  Loose stools  Pseudoseizure      Delman Kitten, MD 11/04/14 1204

## 2014-11-07 LAB — URINE CULTURE
Culture: >=100000
SPECIAL REQUESTS: NORMAL

## 2014-11-08 NOTE — Progress Notes (Addendum)
ED Antimicrobial Stewardship Positive Culture Follow Up   Rachael Gray is an 34 y.o. female who presented to [facility] on 11/04/2014 with a chief complaint of   Chief Complaint  Patient presents with  . Seizures    Recent Results (from the past 720 hour(s))  Urine culture     Status: None   Collection Time: 11/04/14  8:51 AM  Result Value Ref Range Status   Specimen Description URINE, RANDOM  Final   Special Requests Normal  Final   Culture >=100,000 COLONIES/mL ENTEROBACTER AEROGENES  Final   Report Status 11/07/2014 FINAL  Final   Organism ID, Bacteria ENTEROBACTER AEROGENES  Final      Susceptibility   Enterobacter aerogenes - MIC*    CEFTAZIDIME <=1 SENSITIVE Sensitive     CEFAZOLIN >=64 RESISTANT Resistant     CEFTRIAXONE <=1 SENSITIVE Sensitive     CIPROFLOXACIN <=0.25 SENSITIVE Sensitive     GENTAMICIN <=1 SENSITIVE Sensitive     IMIPENEM 2 SENSITIVE Sensitive     TRIMETH/SULFA <=20 SENSITIVE Sensitive     NITROFURANTOIN Value in next row Intermediate      INTERMEDIATE64    PIP/TAZO Value in next row Sensitive      SENSITIVE<=4    * >=100,000 COLONIES/mL ENTEROBACTER AEROGENES     [x]  Patient discharged originally without antimicrobial agent and treatment is now indicated  New antibiotic prescription: Ciprofloxacin 500 mg PO BID x 7 days for treatment of UTI Rx called in to [facility]  ED Provider: [name] Lore with MD Quale about patient's history of pseudoseizure and use of Cipro.  MD okay with Cipro use as patient's pseudoseizure likely due to anxiety.  Murrell Converse, PharmD Clinical Pharmacist 11/08/2014

## 2014-11-12 ENCOUNTER — Encounter: Payer: Self-pay | Admitting: Emergency Medicine

## 2014-11-12 ENCOUNTER — Emergency Department
Admission: EM | Admit: 2014-11-12 | Discharge: 2014-11-12 | Disposition: A | Discharge status: Home / Self Care | Payer: Self-pay | Attending: Emergency Medicine | Admitting: Emergency Medicine

## 2014-11-12 DIAGNOSIS — E1165 Type 2 diabetes mellitus with hyperglycemia: Secondary | ICD-10-CM

## 2014-11-12 DIAGNOSIS — R000 Tachycardia, unspecified: Secondary | ICD-10-CM | POA: Insufficient documentation

## 2014-11-12 DIAGNOSIS — R569 Unspecified convulsions: Secondary | ICD-10-CM

## 2014-11-12 DIAGNOSIS — Z720 Tobacco use: Secondary | ICD-10-CM | POA: Insufficient documentation

## 2014-11-12 DIAGNOSIS — Z3202 Encounter for pregnancy test, result negative: Secondary | ICD-10-CM | POA: Insufficient documentation

## 2014-11-12 DIAGNOSIS — Z794 Long term (current) use of insulin: Secondary | ICD-10-CM | POA: Insufficient documentation

## 2014-11-12 DIAGNOSIS — G40909 Epilepsy, unspecified, not intractable, without status epilepticus: Secondary | ICD-10-CM | POA: Insufficient documentation

## 2014-11-12 LAB — CBC WITH DIFFERENTIAL/PLATELET
BASOS ABS: 0.1 10*3/uL (ref 0–0.1)
Basophils Relative: 1 %
EOS PCT: 2 %
Eosinophils Absolute: 0.1 10*3/uL (ref 0–0.7)
HCT: 41.1 % (ref 35.0–47.0)
HEMOGLOBIN: 13.3 g/dL (ref 12.0–16.0)
LYMPHS ABS: 2.4 10*3/uL (ref 1.0–3.6)
LYMPHS PCT: 30 %
MCH: 28.1 pg (ref 26.0–34.0)
MCHC: 32.3 g/dL (ref 32.0–36.0)
MCV: 86.9 fL (ref 80.0–100.0)
Monocytes Absolute: 0.6 10*3/uL (ref 0.2–0.9)
Monocytes Relative: 7 %
NEUTROS PCT: 60 %
Neutro Abs: 4.9 10*3/uL (ref 1.4–6.5)
PLATELETS: 218 10*3/uL (ref 150–440)
RBC: 4.73 MIL/uL (ref 3.80–5.20)
RDW: 14.2 % (ref 11.5–14.5)
WBC: 8 10*3/uL (ref 3.6–11.0)

## 2014-11-12 LAB — URINALYSIS COMPLETE WITH MICROSCOPIC (ARMC ONLY)
Bilirubin Urine: NEGATIVE
Nitrite: NEGATIVE
Protein, ur: 30 mg/dL — AB
Specific Gravity, Urine: 1.03 (ref 1.005–1.030)
pH: 6 (ref 5.0–8.0)

## 2014-11-12 LAB — COMPREHENSIVE METABOLIC PANEL
ALK PHOS: 47 U/L (ref 38–126)
ALT: 8 U/L — AB (ref 14–54)
AST: 21 U/L (ref 15–41)
Albumin: 4.3 g/dL (ref 3.5–5.0)
Anion gap: 11 (ref 5–15)
BUN: 8 mg/dL (ref 6–20)
CALCIUM: 9.3 mg/dL (ref 8.9–10.3)
CO2: 24 mmol/L (ref 22–32)
CREATININE: 0.59 mg/dL (ref 0.44–1.00)
Chloride: 103 mmol/L (ref 101–111)
GFR calc Af Amer: >60 mL/min (ref >60)
Glucose, Bld: 304 mg/dL — ABNORMAL HIGH (ref 65–99)
Potassium: 3.9 mmol/L (ref 3.5–5.1)
Sodium: 138 mmol/L (ref 135–145)
Total Bilirubin: 0.6 mg/dL (ref 0.3–1.2)
Total Protein: 8 g/dL (ref 6.5–8.1)

## 2014-11-12 LAB — PREGNANCY, URINE: PREG TEST UR: NEGATIVE

## 2014-11-12 MED ORDER — LEVETIRACETAM 750 MG PO TABS: 750.0000 mg | ORAL_TABLET | Freq: Two times a day (BID) | ORAL | Status: DC

## 2014-11-12 MED ORDER — LORAZEPAM 2 MG/ML IJ SOLN
1.0000 mg | Freq: Once | INTRAMUSCULAR | Status: AC
  Administered 2014-11-12: 1 mg via INTRAVENOUS

## 2014-11-12 MED ORDER — SODIUM CHLORIDE 0.9 % IV BOLUS (SEPSIS)
1000.0000 mL | Freq: Once | INTRAVENOUS | Status: AC
  Administered 2014-11-12: 1000 mL via INTRAVENOUS

## 2014-11-12 MED ORDER — SODIUM CHLORIDE 0.9 % IV SOLN
1000.0000 mg | Freq: Once | INTRAVENOUS | Status: AC
  Administered 2014-11-12: 1000 mg via INTRAVENOUS
  Filled 2014-11-12: qty 10

## 2014-11-12 NOTE — ED Notes (Signed)
Seizure activity stops after second ativan IV, pt able to state history, states abd pain

## 2014-11-12 NOTE — ED Notes (Signed)
Pt discharged home after verbalizing understanding of discharge instructions; nad noted. 

## 2014-11-12 NOTE — Discharge Instructions (Signed)
Resume your Keppra. Take 750 mg twice a day. Your sugar is elevated today. He sure to continue your diabetes medications and even appropriate diet. Follow-up with your regular doctor. You can also follow-up at [facility] clinic walk-in. You should see a neurologist as well. Below is the name for a neurologist at [facility] clinic.  Seizure, Adult A seizure means there is unusual activity in the brain. A seizure can cause changes in attention or behavior. Seizures often cause shaking (convulsions). Seizures often last from 30 seconds to 2 minutes. HOME CARE   If you are given medicines, take them exactly as told by your doctor.  Keep all doctor visits as told.  Do not swim or drive until your doctor says it is okay.  Teach others what to do if you have a seizure. They should:  Lay you on the ground.  Put a cushion under your head.  Loosen any tight clothing around your neck.  Turn you on your side.  Stay with you until you get better. GET HELP RIGHT AWAY IF:   The seizure lasts longer than 2 to 5 minutes.  The seizure is very bad.  The person does not wake up after the seizure.  The person's attention or behavior changes. Drive the person to the emergency room or call your local emergency services (911 in [place] MAKE SURE YOU:   Understand these instructions.  Will watch your condition.  Will get help right away if you are not doing well or get worse. Document Released: 08/28/2007 Document Revised: 06/03/2011 Document Reviewed: 02/27/2011 [facility] Patient Information 2015 [place], [place]. This information is not intended to replace advice given to you by your health care provider. Make sure you discuss any questions you have with your health care provider.

## 2014-11-12 NOTE — ED Provider Notes (Signed)
[facility] Emergency Department Provider Note  ____________________________________________  Time seen: Initially seen her side where I assisted removing the patient from a private vehicle at approximately 9:35 AM  I have reviewed the triage vital signs and the nursing notes.   HISTORY  Chief Complaint Seizures     HPI Rachael Gray is a 34 y.o. female who has a history of seizures and diabetes. Her family drove her to the emergency department in their private vehicle was she was having an active seizure. We assisted in removing her from the front passenger seat of the car curbside and placing her on a stretcher and bring her into the emergency department to room 5.  At this time it is unclear when this seizure started. History is quite limited currently.    Past Medical History  Diagnosis Date  . Diabetes mellitus without complication   . Seizures     There are no active problems to display for this patient.   Past Surgical History  Procedure Laterality Date  . Appendectomy    . Cholecystectomy      Current Outpatient Rx  Name  Route  Sig  Dispense  Refill  . insulin NPH-regular Human (NOVOLIN 70/30) (70-30) 100 UNIT/ML injection   Subcutaneous   Inject 22-25 Units into the skin 2 (two) times daily with a meal. 25 units in the morning and 22 units in the evening         . levETIRAcetam (KEPPRA) 750 MG tablet   Oral   Take 1 tablet (750 mg total) by mouth 2 (two) times daily.   60 tablet   0     Allergies Doxycycline and Tylenol  No family history on file.  Social History Social History  Substance Use Topics  . Smoking status: Current Every Day Smoker -- 2.00 packs/day    Types: Cigarettes  . Smokeless tobacco: None  . Alcohol Use: Yes    Review of Systems Unable to obtain a review of systems due to the patient's current active seizure ____________________________________________   PHYSICAL EXAM:  VITAL SIGNS: ED  Triage Vitals  Enc Vitals Group     BP 11/12/14 0940 140/82 mmHg     Pulse Rate 11/12/14 0940 82     Resp 11/12/14 0940 20     Temp 11/12/14 0940 98 F (36.7 C)     Temp Source 11/12/14 0940 Oral     SpO2 11/12/14 0940 99 %     Weight 11/12/14 0940 137 lb (62.143 kg)     Height 11/12/14 0940 5\' 5"  (1.651 m)     Head Cir --      Peak Flow --      Pain Score 11/12/14 0940 9     Pain Loc --      Pain Edu? --      Excl. in [place]? --     Constitutional: Active seizure in process. Nonverbal. ENT   Head: Normocephalic and atraumatic.   Nose: No congestion/rhinnorhea.   Mouth/Throat: No blood, no lack to tongue noted. Cardiovascular: Tachycardia with heart rate of 100-110. Normal blood pressure. regular rhythm, no murmur noted Respiratory:  Short shallow breaths with the current seizure. Oxygen saturation of 100% on room air.  Gastrointestinal: Soft and nontender. No distention.  Musculoskeletal: No deformity noted. Patient does have rigidity and tremor consistent with the current seizure. Neurologic: Active seizure in process. Gaze is random and not locked in one direction. Skin:  Skin is warm, dry. No  rash noted. ____________________________________________    LABS (pertinent positives/negatives)  Labs Reviewed  COMPREHENSIVE METABOLIC PANEL - Abnormal; Notable for the following:    Glucose, Bld 304 (*)    ALT 8 (*)    All other components within normal limits  URINALYSIS COMPLETEWITH MICROSCOPIC ([facility] ONLY) - Abnormal; Notable for the following:    Color, Urine YELLOW (*)    APPearance CLEAR (*)    Glucose, UA >500 (*)    Ketones, ur 1+ (*)    Hgb urine dipstick 1+ (*)    Protein, ur 30 (*)    Leukocytes, UA 2+ (*)    Bacteria, UA FEW (*)    Squamous Epithelial / LPF 0-5 (*)    All other components within normal limits  CBC WITH DIFFERENTIAL/PLATELET  PREGNANCY, URINE    CRITICAL CARE Performed by: Ahmed Prima   Total critical care time: 30 minutes  due to management of active seizure upon arrival. The patient was treated with emergent IV medication due to her seizure.  Critical care time was exclusive of separately billable procedures and treating other patients.  Critical care was necessary to treat or prevent imminent or life-threatening deterioration.  Critical care was time spent personally by me on the following activities: development of treatment plan with patient and/or surrogate as well as nursing, discussions with consultants, evaluation of patient's response to treatment, examination of patient, obtaining history from patient or surrogate, ordering and performing treatments and interventions, ordering and review of laboratory studies, ordering and review of radiographic studies, pulse oximetry and re-evaluation of patient's condition.     INITIAL IMPRESSION / ASSESSMENT AND PLAN / ED COURSE  Pertinent labs & imaging results that were available during my care of the patient were reviewed by me and considered in my medical decision making (see chart for details).  Patient with a known seizure history with active seizure upon arrival in the emergency department. I have given the patient 1 mg of Ativan IV twice. After the second dose of Ativan, the seizure activity stopped. A short time afterwards the patient began to be cognitive with limited communication.  The patient was able to tell me that she used to take [place] but this was stopped during her recent hospitalization. We will treat her with Keppra, 1000 mg, IV, now.  ----------------------------------------- 1:41 PM on 11/12/2014 -----------------------------------------  The patient's labs are reasonable except for her elevated glucose level which is 304.  At this time, the patient has been sleeping but is arousable and communicative and in no acute distress. She feels ready to go home. I will write a prescription for Keppra, 750 mg, twice a day. She reports she has a  primary care doctor that she can follow-up with, but she does not know his name and cannot tell me which clinic he works with. In turn, I will provide her with the name of the on-call neurologist as well as. She reports she does not have a neurologist.  ____________________________________________   FINAL CLINICAL IMPRESSION(S) / ED DIAGNOSES  Final diagnoses:  Seizures  Hyperglycemia due to type 2 diabetes mellitus      Ahmed Prima, MD 11/12/14 1353

## 2014-11-12 NOTE — ED Notes (Signed)
Pt ready for discharge but we are waiting for someone to bring her clothing since her clothes were cut off of her when she arrived this morning.

## 2014-11-12 NOTE — ED Notes (Signed)
Patient driven up by family in private car actively seizing. Pt pulled to stretcher and immediately to room 5 with MD and nurse x 3 to room

## 2014-11-28 ENCOUNTER — Emergency Department
Admission: EM | Admit: 2014-11-28 | Discharge: 2014-11-28 | Disposition: A | Discharge status: Home / Self Care | Payer: Self-pay | Attending: Emergency Medicine | Admitting: Emergency Medicine

## 2014-11-28 ENCOUNTER — Emergency Department: Payer: Self-pay

## 2014-11-28 DIAGNOSIS — Z794 Long term (current) use of insulin: Secondary | ICD-10-CM | POA: Insufficient documentation

## 2014-11-28 DIAGNOSIS — R109 Unspecified abdominal pain: Secondary | ICD-10-CM

## 2014-11-28 DIAGNOSIS — N12 Tubulo-interstitial nephritis, not specified as acute or chronic: Secondary | ICD-10-CM | POA: Insufficient documentation

## 2014-11-28 DIAGNOSIS — E119 Type 2 diabetes mellitus without complications: Secondary | ICD-10-CM | POA: Insufficient documentation

## 2014-11-28 DIAGNOSIS — Z3202 Encounter for pregnancy test, result negative: Secondary | ICD-10-CM | POA: Insufficient documentation

## 2014-11-28 DIAGNOSIS — Z79899 Other long term (current) drug therapy: Secondary | ICD-10-CM | POA: Insufficient documentation

## 2014-11-28 DIAGNOSIS — Z720 Tobacco use: Secondary | ICD-10-CM | POA: Insufficient documentation

## 2014-11-28 LAB — URINALYSIS COMPLETE WITH MICROSCOPIC (ARMC ONLY)
BILIRUBIN URINE: NEGATIVE
NITRITE: POSITIVE — AB
Protein, ur: 30 mg/dL — AB
Specific Gravity, Urine: 1.025 (ref 1.005–1.030)
pH: 7 (ref 5.0–8.0)

## 2014-11-28 LAB — CBC WITH DIFFERENTIAL/PLATELET
BASOS ABS: 0.1 10*3/uL (ref 0–0.1)
BASOS PCT: 1 %
Eosinophils Absolute: 0 10*3/uL (ref 0–0.7)
Eosinophils Relative: 1 %
HEMATOCRIT: 40.4 % (ref 35.0–47.0)
Hemoglobin: 13.4 g/dL (ref 12.0–16.0)
Lymphocytes Relative: 26 %
Lymphs Abs: 2.1 10*3/uL (ref 1.0–3.6)
MCH: 28.2 pg (ref 26.0–34.0)
MCHC: 33.1 g/dL (ref 32.0–36.0)
MCV: 85.3 fL (ref 80.0–100.0)
Monocytes Absolute: 0.9 10*3/uL (ref 0.2–0.9)
Monocytes Relative: 11 %
NEUTROS ABS: 5.1 10*3/uL (ref 1.4–6.5)
Neutrophils Relative %: 61 %
Platelets: 144 10*3/uL — ABNORMAL LOW (ref 150–440)
RBC: 4.74 MIL/uL (ref 3.80–5.20)
RDW: 13.7 % (ref 11.5–14.5)
WBC: 8.3 10*3/uL (ref 3.6–11.0)

## 2014-11-28 LAB — COMPREHENSIVE METABOLIC PANEL
ALT: 9 U/L — ABNORMAL LOW (ref 14–54)
ANION GAP: 7 (ref 5–15)
AST: 14 U/L — ABNORMAL LOW (ref 15–41)
Albumin: 3.9 g/dL (ref 3.5–5.0)
Alkaline Phosphatase: 51 U/L (ref 38–126)
BILIRUBIN TOTAL: 0.4 mg/dL (ref 0.3–1.2)
BUN: 6 mg/dL (ref 6–20)
CO2: 25 mmol/L (ref 22–32)
Calcium: 9 mg/dL (ref 8.9–10.3)
Chloride: 102 mmol/L (ref 101–111)
Creatinine, Ser: 0.55 mg/dL (ref 0.44–1.00)
GFR calc Af Amer: >60 mL/min (ref >60)
Glucose, Bld: 282 mg/dL — ABNORMAL HIGH (ref 65–99)
Potassium: 3.9 mmol/L (ref 3.5–5.1)
Sodium: 134 mmol/L — ABNORMAL LOW (ref 135–145)
TOTAL PROTEIN: 7.8 g/dL (ref 6.5–8.1)

## 2014-11-28 LAB — PREGNANCY, URINE: Preg Test, Ur: NEGATIVE

## 2014-11-28 MED ORDER — ONDANSETRON HCL 4 MG/2ML IJ SOLN
4.0000 mg | Freq: Once | INTRAMUSCULAR | Status: AC
  Administered 2014-11-28: 4 mg via INTRAVENOUS
  Filled 2014-11-28: qty 2

## 2014-11-28 MED ORDER — MORPHINE SULFATE (PF) 4 MG/ML IV SOLN
4.0000 mg | Freq: Once | INTRAVENOUS | Status: AC
  Administered 2014-11-28: 4 mg via INTRAVENOUS
  Filled 2014-11-28: qty 1

## 2014-11-28 MED ORDER — CEPHALEXIN 500 MG PO CAPS: 500.0000 mg | ORAL_CAPSULE | Freq: Three times a day (TID) | ORAL | Status: DC

## 2014-11-28 MED ORDER — SODIUM CHLORIDE 0.9 % IV BOLUS (SEPSIS)
1000.0000 mL | Freq: Once | INTRAVENOUS | Status: AC
  Administered 2014-11-28: 1000 mL via INTRAVENOUS

## 2014-11-28 MED ORDER — OXYCODONE HCL 5 MG PO TABS: 5.0000 mg | ORAL_TABLET | ORAL | Status: DC | PRN

## 2014-11-28 MED ORDER — DEXTROSE 5 % IV SOLN
1.0000 g | Freq: Once | INTRAVENOUS | Status: AC
  Administered 2014-11-28: 1 g via INTRAVENOUS
  Filled 2014-11-28: qty 10

## 2014-11-28 NOTE — ED Provider Notes (Signed)
[facility] Emergency Department Provider Note  ____________________________________________  Time seen: Approximately 7062 PM  I have reviewed the triage vital signs and the nursing notes.   HISTORY  Chief Complaint Flank Pain    HPI Olubunmi L Livecchi is a 34 y.o. female with a history of a seizure disorder who is presenting today with right flank pain over the past 2 days. She says that she's also had fever associated with this. She denies any urinary symptoms. No burning with urination. No family history kidney stones. No history of herself with kidney stones. No vaginal bleeding or discharge. No nausea or vomiting. No abdominal pain. Says the pain is become constant and moderate to severe.   Past Medical History  Diagnosis Date  . Diabetes mellitus without complication   . Seizures     There are no active problems to display for this patient.   Past Surgical History  Procedure Laterality Date  . Appendectomy    . Cholecystectomy      Current Outpatient Rx  Name  Route  Sig  Dispense  Refill  . insulin NPH-regular Human (NOVOLIN 70/30) (70-30) 100 UNIT/ML injection   Subcutaneous   Inject 22-25 Units into the skin 2 (two) times daily with a meal. 25 units in the morning and 22 units in the evening         . levETIRAcetam (KEPPRA) 750 MG tablet   Oral   Take 1 tablet (750 mg total) by mouth 2 (two) times daily.   60 tablet   0     Allergies Doxycycline and Tylenol  No family history on file.  Social History Social History  Substance Use Topics  . Smoking status: Current Every Day Smoker -- 2.00 packs/day    Types: Cigarettes  . Smokeless tobacco: Not on file  . Alcohol Use: Yes    Review of Systems Constitutional: No fever/chills Eyes: No visual changes. ENT: No sore throat. Cardiovascular: Denies chest pain. Respiratory: Denies shortness of breath. Gastrointestinal: No abdominal pain.  No nausea, no vomiting.  No  diarrhea.  No constipation. Genitourinary: Negative for dysuria. Musculoskeletal: Negative for back pain. Skin: Negative for rash. Neurological: Negative for headaches, focal weakness or numbness.  10-point ROS otherwise negative.  ____________________________________________   PHYSICAL EXAM:  VITAL SIGNS: ED Triage Vitals  Enc Vitals Group     BP 11/28/14 0927 109/66 mmHg     Pulse Rate 11/28/14 0927 95     Resp 11/28/14 0927 18     Temp 11/28/14 0927 98.8 F (37.1 C)     Temp Source 11/28/14 0927 Oral     SpO2 11/28/14 0927 100 %     Weight 11/28/14 0927 141 lb (63.957 kg)     Height 11/28/14 0927 5\' 5"  (1.651 m)     Head Cir --      Peak Flow --      Pain Score 11/28/14 0927 9     Pain Loc --      Pain Edu? --      Excl. in [place]? --     Constitutional: Alert and oriented. Well appearing and in no acute distress. Eyes: Conjunctivae are normal. PERRL. EOMI. Head: Atraumatic. Nose: No congestion/rhinnorhea. Mouth/Throat: Mucous membranes are moist.  Oropharynx non-erythematous. Neck: No stridor.   Cardiovascular: Normal rate, regular rhythm. Grossly normal heart sounds.  Good peripheral circulation. Respiratory: Normal respiratory effort.  No retractions. Lungs CTAB. Gastrointestinal: Soft and nontender. No distention. No abdominal bruits. Right-sided CVA tenderness  palpation. Musculoskeletal: No lower extremity tenderness nor edema.  No joint effusions. Neurologic:  Normal speech and language. No gross focal neurologic deficits are appreciated. No gait instability. Skin:  Skin is warm, dry and intact. No rash noted. Psychiatric: Mood and affect are normal. Speech and behavior are normal.  ____________________________________________   LABS (all labs ordered are listed, but only abnormal results are displayed)  Labs Reviewed  COMPREHENSIVE METABOLIC PANEL - Abnormal; Notable for the following:    Sodium 134 (*)    Glucose, Bld 282 (*)    AST 14 (*)    ALT 9 (*)     All other components within normal limits  CBC WITH DIFFERENTIAL/PLATELET - Abnormal; Notable for the following:    Platelets 144 (*)    All other components within normal limits  URINALYSIS COMPLETEWITH MICROSCOPIC ([facility] ONLY) - Abnormal; Notable for the following:    Color, Urine YELLOW (*)    APPearance CLOUDY (*)    Glucose, UA >500 (*)    Ketones, ur TRACE (*)    Hgb urine dipstick 1+ (*)    Protein, ur 30 (*)    Nitrite POSITIVE (*)    Leukocytes, UA 3+ (*)    Bacteria, UA MANY (*)    Squamous Epithelial / LPF 0-5 (*)    All other components within normal limits  URINE CULTURE  PREGNANCY, URINE   ____________________________________________  EKG   ____________________________________________  RADIOLOGY  Patient with CT evidence of pyelonephritis. No kidney stone on the CAT scan. ____________________________________________   PROCEDURES   ____________________________________________   INITIAL IMPRESSION / ASSESSMENT AND PLAN / ED COURSE  Pertinent labs & imaging results that were available during my care of the patient were reviewed by me and considered in my medical decision making (see chart for details).  ----------------------------------------- 1:06 PM on 11/28/2014 -----------------------------------------  Asian with mild improvement after morphine. We'll discharge home with Percocet as well as antibiotic treatment for pyelonephritis. Patient as well as her daughter was at the bedside or splint results and understand the plan and are willing to comply. ____________________________________________   FINAL CLINICAL IMPRESSION(S) / ED DIAGNOSES  Acute pyelonephritis. Initial visit.    Orbie Pyo, MD 11/28/14 [phone]

## 2014-11-28 NOTE — Discharge Instructions (Signed)
Pyelonephritis, Adult °Pyelonephritis is a kidney infection. A kidney infection can happen quickly, or it can last for a long time. °HOME CARE  °· Take your medicine (antibiotics) as told. Finish it even if you start to feel better. °· Keep all doctor visits as told. °· Drink enough fluids to keep your pee (urine) clear or pale yellow. °· Only take medicine as told by your doctor. °GET HELP RIGHT AWAY IF:  °· You have a fever or lasting symptoms for more than 2-3 days. °· You have a fever and your symptoms suddenly get worse. °· You cannot take your medicine or drink fluids as told. °· You have chills and shaking. °· You feel very weak or pass out (faint). °· You do not feel better after 2 days. °MAKE SURE YOU: °· Understand these instructions. °· Will watch your condition. °· Will get help right away if you are not doing well or get worse. °Document Released: 04/18/2004 Document Revised: 09/10/2011 Document Reviewed: 08/29/2010 °ExitCare® Patient Information ©2015 [employer]. This information is not intended to replace advice given to you by your health care provider. Make sure you discuss any questions you have with your health care provider. ° °

## 2014-11-28 NOTE — ED Notes (Signed)
Pt reports pain to right flank that started 2 days ago. Pain is constant. Denies urinary symptoms. Reports fevers, denies NV.

## 2014-11-30 LAB — URINE CULTURE: Culture: >=100000

## 2014-12-04 ENCOUNTER — Observation Stay
Admission: EM | Admit: 2014-12-04 | Discharge: 2014-12-07 | Disposition: A | Discharge status: Home / Self Care | Payer: Self-pay | Attending: Internal Medicine | Admitting: Internal Medicine

## 2014-12-04 ENCOUNTER — Encounter: Payer: Self-pay | Admitting: Emergency Medicine

## 2014-12-04 DIAGNOSIS — E1143 Type 2 diabetes mellitus with diabetic autonomic (poly)neuropathy: Secondary | ICD-10-CM | POA: Insufficient documentation

## 2014-12-04 DIAGNOSIS — Z794 Long term (current) use of insulin: Secondary | ICD-10-CM | POA: Insufficient documentation

## 2014-12-04 DIAGNOSIS — R109 Unspecified abdominal pain: Secondary | ICD-10-CM | POA: Insufficient documentation

## 2014-12-04 DIAGNOSIS — E872 Acidosis, unspecified: Secondary | ICD-10-CM | POA: Diagnosis present

## 2014-12-04 DIAGNOSIS — E1165 Type 2 diabetes mellitus with hyperglycemia: Secondary | ICD-10-CM | POA: Insufficient documentation

## 2014-12-04 DIAGNOSIS — G40909 Epilepsy, unspecified, not intractable, without status epilepticus: Secondary | ICD-10-CM | POA: Insufficient documentation

## 2014-12-04 DIAGNOSIS — Z881 Allergy status to other antibiotic agents status: Secondary | ICD-10-CM | POA: Insufficient documentation

## 2014-12-04 DIAGNOSIS — Z9049 Acquired absence of other specified parts of digestive tract: Secondary | ICD-10-CM | POA: Insufficient documentation

## 2014-12-04 DIAGNOSIS — N12 Tubulo-interstitial nephritis, not specified as acute or chronic: Principal | ICD-10-CM | POA: Diagnosis present

## 2014-12-04 DIAGNOSIS — R000 Tachycardia, unspecified: Secondary | ICD-10-CM | POA: Insufficient documentation

## 2014-12-04 DIAGNOSIS — A419 Sepsis, unspecified organism: Secondary | ICD-10-CM | POA: Diagnosis present

## 2014-12-04 DIAGNOSIS — Z886 Allergy status to analgesic agent status: Secondary | ICD-10-CM | POA: Insufficient documentation

## 2014-12-04 DIAGNOSIS — F419 Anxiety disorder, unspecified: Secondary | ICD-10-CM | POA: Diagnosis present

## 2014-12-04 DIAGNOSIS — F1110 Opioid abuse, uncomplicated: Secondary | ICD-10-CM | POA: Diagnosis present

## 2014-12-04 DIAGNOSIS — Z87891 Personal history of nicotine dependence: Secondary | ICD-10-CM | POA: Insufficient documentation

## 2014-12-04 DIAGNOSIS — F4322 Adjustment disorder with anxiety: Secondary | ICD-10-CM | POA: Insufficient documentation

## 2014-12-04 DIAGNOSIS — Z79899 Other long term (current) drug therapy: Secondary | ICD-10-CM | POA: Insufficient documentation

## 2014-12-04 LAB — CBC
HCT: 43.4 % (ref 35.0–47.0)
Hemoglobin: 14.5 g/dL (ref 12.0–16.0)
MCH: 28.3 pg (ref 26.0–34.0)
MCHC: 33.3 g/dL (ref 32.0–36.0)
MCV: 84.9 fL (ref 80.0–100.0)
PLATELETS: 269 10*3/uL (ref 150–440)
RBC: 5.11 MIL/uL (ref 3.80–5.20)
RDW: 13.7 % (ref 11.5–14.5)
WBC: 10.3 10*3/uL (ref 3.6–11.0)

## 2014-12-04 LAB — URINALYSIS COMPLETE WITH MICROSCOPIC (ARMC ONLY)
BILIRUBIN URINE: NEGATIVE
Glucose, UA: >500 mg/dL — AB
Hgb urine dipstick: NEGATIVE
Nitrite: NEGATIVE
PH: 6 (ref 5.0–8.0)
PROTEIN: 100 mg/dL — AB
Specific Gravity, Urine: 1.029 (ref 1.005–1.030)

## 2014-12-04 LAB — COMPREHENSIVE METABOLIC PANEL
ALBUMIN: 4.5 g/dL (ref 3.5–5.0)
ALK PHOS: 50 U/L (ref 38–126)
ALT: 12 U/L — AB (ref 14–54)
AST: 26 U/L (ref 15–41)
Anion gap: 14 (ref 5–15)
BUN: 10 mg/dL (ref 6–20)
CALCIUM: 9.9 mg/dL (ref 8.9–10.3)
CHLORIDE: 100 mmol/L — AB (ref 101–111)
CO2: 22 mmol/L (ref 22–32)
CREATININE: 0.63 mg/dL (ref 0.44–1.00)
GFR calc Af Amer: >60 mL/min (ref >60)
GFR calc non Af Amer: >60 mL/min (ref >60)
GLUCOSE: 299 mg/dL — AB (ref 65–99)
Potassium: 3.7 mmol/L (ref 3.5–5.1)
SODIUM: 136 mmol/L (ref 135–145)
Total Bilirubin: 0.8 mg/dL (ref 0.3–1.2)
Total Protein: 9 g/dL — ABNORMAL HIGH (ref 6.5–8.1)

## 2014-12-04 LAB — MAGNESIUM: MAGNESIUM: 1.4 mg/dL — AB (ref 1.7–2.4)

## 2014-12-04 LAB — LIPASE, BLOOD: Lipase: 64 U/L — ABNORMAL HIGH (ref 22–51)

## 2014-12-04 LAB — LACTIC ACID, PLASMA
LACTIC ACID, VENOUS: 2.5 mmol/L — AB (ref 0.5–2.0)
Lactic Acid, Venous: 4.4 mmol/L (ref 0.5–2.0)

## 2014-12-04 LAB — GLUCOSE, CAPILLARY: GLUCOSE-CAPILLARY: 331 mg/dL — AB (ref 65–99)

## 2014-12-04 MED ORDER — ONDANSETRON HCL 4 MG PO TABS: 4.0000 mg | ORAL_TABLET | Freq: Four times a day (QID) | ORAL | Status: DC | PRN

## 2014-12-04 MED ORDER — DEXTROSE 5 % IV SOLN
1.0000 g | Freq: Once | INTRAVENOUS | Status: AC
  Administered 2014-12-04: 1 g via INTRAVENOUS
  Filled 2014-12-04: qty 10

## 2014-12-04 MED ORDER — INSULIN ASPART 100 UNIT/ML ~~LOC~~ SOLN
0.0000 [IU] | Freq: Three times a day (TID) | SUBCUTANEOUS | Status: DC
Start: 2014-12-05 — End: 2014-12-07
  Administered 2014-12-05: 2 [IU] via SUBCUTANEOUS
  Administered 2014-12-05: 5 [IU] via SUBCUTANEOUS
  Administered 2014-12-06: 2 [IU] via SUBCUTANEOUS
  Administered 2014-12-06: 1 [IU] via SUBCUTANEOUS
  Administered 2014-12-07: 2 [IU] via SUBCUTANEOUS
  Filled 2014-12-04: qty 2
  Filled 2014-12-04: qty 1
  Filled 2014-12-04: qty 5
  Filled 2014-12-04 (×2): qty 2

## 2014-12-04 MED ORDER — PROMETHAZINE HCL 25 MG/ML IJ SOLN
12.5000 mg | Freq: Four times a day (QID) | INTRAMUSCULAR | Status: DC | PRN
  Administered 2014-12-05 (×3): 12.5 mg via INTRAVENOUS
  Filled 2014-12-04 (×3): qty 1

## 2014-12-04 MED ORDER — LORAZEPAM 2 MG/ML IJ SOLN
2.0000 mg | Freq: Once | INTRAMUSCULAR | Status: AC
  Administered 2014-12-04: 2 mg via INTRAVENOUS
  Filled 2014-12-04: qty 1

## 2014-12-04 MED ORDER — SODIUM CHLORIDE 0.9 % IV BOLUS (SEPSIS)
1000.0000 mL | Freq: Once | INTRAVENOUS | Status: AC
  Administered 2014-12-04: 1000 mL via INTRAVENOUS

## 2014-12-04 MED ORDER — ALPRAZOLAM 0.25 MG PO TABS: 0.2500 mg | ORAL_TABLET | Freq: Three times a day (TID) | ORAL | Status: DC | PRN

## 2014-12-04 MED ORDER — INSULIN GLARGINE 100 UNIT/ML ~~LOC~~ SOLN
22.0000 [IU] | Freq: Every day | SUBCUTANEOUS | Status: DC
  Administered 2014-12-05 – 2014-12-06 (×3): 22 [IU] via SUBCUTANEOUS
  Filled 2014-12-04 (×4): qty 0.22

## 2014-12-04 MED ORDER — ALBUTEROL SULFATE (2.5 MG/3ML) 0.083% IN NEBU: 2.5000 mg | INHALATION_SOLUTION | RESPIRATORY_TRACT | Status: DC | PRN

## 2014-12-04 MED ORDER — HEPARIN SODIUM (PORCINE) 5000 UNIT/ML IJ SOLN
5000.0000 [IU] | Freq: Three times a day (TID) | INTRAMUSCULAR | Status: DC
  Administered 2014-12-05 – 2014-12-06 (×4): 5000 [IU] via SUBCUTANEOUS
  Filled 2014-12-04 (×5): qty 1

## 2014-12-04 MED ORDER — LEVETIRACETAM 750 MG PO TABS
750.0000 mg | ORAL_TABLET | Freq: Two times a day (BID) | ORAL | Status: DC
  Filled 2014-12-04: qty 1

## 2014-12-04 MED ORDER — ONDANSETRON HCL 4 MG PO TABS: 4.0000 mg | ORAL_TABLET | ORAL | Status: DC | PRN

## 2014-12-04 MED ORDER — LORAZEPAM 2 MG/ML IJ SOLN
1.0000 mg | INTRAMUSCULAR | Status: DC | PRN
  Administered 2014-12-05: 1 mg via INTRAVENOUS
  Filled 2014-12-04: qty 1

## 2014-12-04 MED ORDER — KETOROLAC TROMETHAMINE 30 MG/ML IJ SOLN
30.0000 mg | Freq: Four times a day (QID) | INTRAMUSCULAR | Status: DC | PRN
  Administered 2014-12-05 (×2): 30 mg via INTRAVENOUS
  Filled 2014-12-04 (×2): qty 1

## 2014-12-04 MED ORDER — CEFTRIAXONE SODIUM 1 G IJ SOLR
1.0000 g | INTRAMUSCULAR | Status: DC
  Filled 2014-12-04 (×2): qty 10

## 2014-12-04 MED ORDER — ONDANSETRON HCL 4 MG/2ML IJ SOLN: 4.0000 mg | Freq: Four times a day (QID) | INTRAMUSCULAR | Status: DC | PRN

## 2014-12-04 MED ORDER — ONDANSETRON HCL 4 MG/2ML IJ SOLN
4.0000 mg | INTRAMUSCULAR | Status: DC | PRN
  Administered 2014-12-04 – 2014-12-05 (×4): 4 mg via INTRAVENOUS
  Filled 2014-12-04 (×4): qty 2

## 2014-12-04 MED ORDER — INSULIN ASPART 100 UNIT/ML ~~LOC~~ SOLN
0.0000 [IU] | Freq: Every day | SUBCUTANEOUS | Status: DC
  Administered 2014-12-05: 4 [IU] via SUBCUTANEOUS
  Filled 2014-12-04: qty 4

## 2014-12-04 MED ORDER — ONDANSETRON HCL 4 MG/2ML IJ SOLN
4.0000 mg | INTRAMUSCULAR | Status: AC
Start: 2014-12-04 — End: 2014-12-04
  Administered 2014-12-04: 4 mg via INTRAVENOUS
  Filled 2014-12-04: qty 2

## 2014-12-04 MED ORDER — ONDANSETRON HCL 4 MG/2ML IJ SOLN
4.0000 mg | INTRAMUSCULAR | Status: AC
  Administered 2014-12-04: 4 mg via INTRAVENOUS
  Filled 2014-12-04: qty 2

## 2014-12-04 MED ORDER — SODIUM CHLORIDE 0.9 % IV SOLN
INTRAVENOUS | Status: AC
  Administered 2014-12-05 (×3): via INTRAVENOUS

## 2014-12-04 MED ORDER — OXYCODONE HCL 5 MG PO TABS
5.0000 mg | ORAL_TABLET | ORAL | Status: DC | PRN
  Administered 2014-12-06: 5 mg via ORAL
  Filled 2014-12-04: qty 1

## 2014-12-04 MED ORDER — KETOROLAC TROMETHAMINE 30 MG/ML IJ SOLN
30.0000 mg | Freq: Once | INTRAMUSCULAR | Status: AC
  Administered 2014-12-04: 30 mg via INTRAVENOUS
  Filled 2014-12-04: qty 1

## 2014-12-04 MED ORDER — ONDANSETRON HCL 4 MG/2ML IJ SOLN: 4.0000 mg | Freq: Once | INTRAMUSCULAR | Status: DC

## 2014-12-04 NOTE — H&P (Addendum)
[facility] [place] NAME: Rachael Gray    MR#:  [id number]  DATE OF BIRTH:  1980/07/18  DATE OF ADMISSION:  12/04/2014  PRIMARY CARE PHYSICIAN: No PCP Per Patient   REQUESTING/REFERRING PHYSICIAN: Dr. Jacqualine Code  CHIEF COMPLAINT:   Chief Complaint  Patient presents with  . Abdominal Pain    HISTORY OF PRESENT ILLNESS:  Rachael Gray  is a 34 y.o. female with a known history of diabetes and the seizure disorder. The patient came to the ED due to right flank pain 6 days ago. CAT scan of abdomen show possible pyelonephritis. Urine culture showed Escherichia coli. She was given Keflex by mouth and discharged to home. But she couldn't afford the medication and didn't take by mouth antibiotics. She has had worsening nausea, vomiting, right-sided flank pain and abdominal pain. In addition she's tachycardia at 120 to 130s. He was treated with Rocephin 1 dose in ED. she is extremely anxious and body shaking in ED. She said morphine doesn't work for her and she wants Dilaudid.she was treated with Ativan twice and Zofran once.  PAST MEDICAL HISTORY:   Past Medical History  Diagnosis Date  . Diabetes mellitus without complication   . Seizures     PAST SURGICAL HISTORY:   Past Surgical History  Procedure Laterality Date  . Appendectomy    . Cholecystectomy      SOCIAL HISTORY:   Social History  Substance Use Topics  . Smoking status: Former Smoker -- 2.00 packs/day    Types: Cigarettes  . Smokeless tobacco: Not on file  . Alcohol Use: Yes  Denies any alcohol drinking.  FAMILY HISTORY:  No family history on file.  DRUG ALLERGIES:   Allergies  Allergen Reactions  . Doxycycline Swelling  . Tylenol [Acetaminophen] Rash    REVIEW OF SYSTEMS:  CONSTITUTIONAL: No fever,generalized  weakness.  EYES: No blurred or double vision.  EARS, NOSE, AND THROAT: No tinnitus or ear pain.  RESPIRATORY: No cough, shortness of breath,  wheezing or hemoptysis.  CARDIOVASCULAR: No chest pain, orthopnea, edema.  GASTROINTESTINAL:Positive abdominal pain, right flank pain  nausea, vomiting,  no diarrhea or melena or bloody stool.  GENITOURINARY: No dysuria, hematuria. But has urine frequency.  ENDOCRINE: No polyuria, nocturia,  HEMATOLOGY: No anemia, easy bruising or bleeding SKIN: No rash or lesion. MUSCULOSKELETAL: No joint pain or arthritis.   NEUROLOGIC: No tingling, numbness, weakness.  PSYCHIATRY:Has anxiety  but no depression.   MEDICATIONS AT HOME:   Prior to Admission medications   Medication Sig Start Date End Date Taking? Authorizing Provider  cephALEXin (KEFLEX) 500 MG capsule Take 1 capsule (500 mg total) by mouth 3 (three) times daily. 11/28/14 12/08/14 Yes Orbie Pyo, MD  insulin NPH-regular Human (NOVOLIN 70/30) (70-30) 100 UNIT/ML injection Inject 22-25 Units into the skin 2 (two) times daily with a meal. 25 units in the morning and 22 units in the evening   Yes Historical Provider, MD  levETIRAcetam (KEPPRA) 750 MG tablet Take 1 tablet (750 mg total) by mouth 2 (two) times daily. 11/12/14  Yes Ahmed Prima, MD  oxyCODONE (OXY IR/ROXICODONE) 5 MG immediate release tablet Take 1 tablet (5 mg total) by mouth every 4 (four) hours as needed for moderate pain or severe pain. 11/28/14   Orbie Pyo, MD      VITAL SIGNS:  Blood pressure 140/118, pulse 121, temperature 98.7 F (37.1 C), temperature source Oral, resp. rate 24, height 5\' 5"  (1.651  m), weight 63.957 kg (141 lb), last menstrual period 11/13/2014, SpO2 97 %.  PHYSICAL EXAMINATION:  GENERAL:  34 y.o.-year-old patient lying in the bed with no acute distress.  EYES: Pupils equal, round, reactive to light and accommodation. No scleral icterus. Extraocular muscles intact.  HEENT: Head atraumatic, normocephalic. Oropharynx and nasopharynx clear.  NECK:  Supple, no jugular venous distention. No thyroid enlargement, no tenderness.   LUNGS: Normal breath sounds bilaterally, no wheezing, rales,rhonchi or crepitation. No use of accessory muscles of respiration.  CARDIOVASCULAR: S1, S2 normal. No murmurs, rubs, or gallops.  ABDOMEN: Soft, nontender, nondistended. Bowel sounds present. No organomegaly or mass.  EXTREMITIES: No pedal edema, cyanosis, or clubbing.  NEUROLOGIC: Cranial nerves II through XII are intact. Muscle strength 5/5 in all extremities. Sensation intact. Gait not checked. Subjective body shaking but stopped shaking upon request.  PSYCHIATRIC: The patient is alert and oriented x 3. Looks anxious.  SKIN: No obvious rash, lesion, or ulcer.   LABORATORY PANEL:   CBC  Recent Labs Lab 12/04/14 1739  WBC 10.3  HGB 14.5  HCT 43.4  PLT 269   ------------------------------------------------------------------------------------------------------------------  Chemistries   Recent Labs Lab 12/04/14 1739  NA 136  K 3.7  CL 100*  CO2 22  GLUCOSE 299*  BUN 10  CREATININE 0.63  CALCIUM 9.9  AST 26  ALT 12*  ALKPHOS 50  BILITOT 0.8   ------------------------------------------------------------------------------------------------------------------  Cardiac Enzymes No results for input(s): TROPONINI in the last 168 hours. ------------------------------------------------------------------------------------------------------------------  RADIOLOGY:  No results found.  EKG:   Orders placed or performed during the hospital encounter of 12/04/14  . EKG 12-Lead  . EKG 12-Lead  . EKG 12-Lead  . EKG 12-Lead    IMPRESSION AND PLAN:    Pyelonephritis Lactic acidosis Tachycardia Uncontrolled diabetes 2 Anxiety Seizure disorder  Patient will be placed for observation. She was treated with Rocephin 1 dose in the ED I will continue IV Rocephin, start IV fluid support and follow-up urine culture. Follow-up lactic acid. For uncontrolled diabetes 2, I will check hemoglobin A1c, start sliding scale  and Lantus. Continue Keppra Give Xanax and Ativan when necessary.  All the records are reviewed and case discussed with ED provider. Management plans discussed with the patient, family and they are in agreement.  CODE STATUS: Full code  TOTAL TIME TAKING CARE OF THIS PATIENT: 48 minutes.    Demetrios Loll M.D on 12/04/2014 at 6:49 PM  Between 7am to 6pm - Pager - [phone]  After 6pm go to [email or web address] - password [email or web address] [facility]  [facility]  Office  [phone]  CC: Primary care physician; No PCP Per Patient

## 2014-12-04 NOTE — ED Notes (Signed)
Pt comes into the ED via POV c/o upper abdominal pain, N/V, and shaking continuously.  Patient has history of epilepsy.  Patient states she was seen here recently for kidney infection and UTI.  Patient very anxious upon assessment.

## 2014-12-04 NOTE — ED Notes (Signed)
MD at bedside. 

## 2014-12-04 NOTE — ED Notes (Signed)
Admitting MD at bedside.

## 2014-12-04 NOTE — ED Provider Notes (Signed)
[facility] Emergency Department Provider Note REMINDER - THIS NOTE IS NOT A FINAL MEDICAL RECORD UNTIL IT IS SIGNED. UNTIL THEN, THE CONTENT BELOW MAY REFLECT INFORMATION FROM A DOCUMENTATION TEMPLATE, NOT THE ACTUAL PATIENT VISIT. ____________________________________________  Time seen: Approximately 6:12 PM  I have reviewed the triage vital signs and the nursing notes.   HISTORY  Chief Complaint Abdominal Pain    HPI Rachael Gray is a 34 y.o. female reports that she started having right flank pain about a week ago and was diagnosed with urinary tract infection. She reports that she was unable to afford any antibiotic's and the symptoms have worsened and she is nauseated vomiting and feeling dehydrated. She reports that she is having severe pain in her right flank and also feeling extremely anxious and shaky.  Should be noted the patient is extremely anxious appearing. She is tremulous in all extremities, but awake and alert and speaking.  Patient denies pregnancy. She reports nausea and vomiting several times this morning.  Review of records demonstrates the patient was diagnosed with pyelonephritis recently, this is Escherichia coli pansensitive.  Past Medical History  Diagnosis Date  . Diabetes mellitus without complication   . Seizures     Patient Active Problem List   Diagnosis Date Noted  . Pyelonephritis 12/04/2014  . Lactic acidosis 12/04/2014    Class: Acute    Past Surgical History  Procedure Laterality Date  . Appendectomy    . Cholecystectomy      Current Outpatient Rx  Name  Route  Sig  Dispense  Refill  . cephALEXin (KEFLEX) 500 MG capsule   Oral   Take 1 capsule (500 mg total) by mouth 3 (three) times daily.   42 capsule   0   . insulin NPH-regular Human (NOVOLIN 70/30) (70-30) 100 UNIT/ML injection   Subcutaneous   Inject 22-25 Units into the skin 2 (two) times daily with a meal. 25 units in the morning and 22 units  in the evening         . levETIRAcetam (KEPPRA) 750 MG tablet   Oral   Take 1 tablet (750 mg total) by mouth 2 (two) times daily.   60 tablet   0   . oxyCODONE (OXY IR/ROXICODONE) 5 MG immediate release tablet   Oral   Take 1 tablet (5 mg total) by mouth every 4 (four) hours as needed for moderate pain or severe pain.   15 tablet   0     Allergies Doxycycline and Tylenol  No family history on file.  Social History Social History  Substance Use Topics  . Smoking status: Former Smoker -- 2.00 packs/day    Types: Cigarettes  . Smokeless tobacco: None  . Alcohol Use: Yes    Review of Systems Constitutional: Fevers and chills Eyes: No visual changes. ENT: No sore throat. Cardiovascular: Denies chest pain. Respiratory: Denies shortness of breath. Gastrointestinal: See history of present illness No diarrhea.  No constipation. Genitourinary: Burning with urination Musculoskeletal: Negative for back pain. Skin: Negative for rash. Neurological: Negative for headaches, focal weakness or numbness.  10-point ROS otherwise negative.  ____________________________________________   PHYSICAL EXAM:  VITAL SIGNS: ED Triage Vitals  Enc Vitals Group     BP 12/04/14 1704 140/118 mmHg     Pulse Rate 12/04/14 1704 121     Resp 12/04/14 1704 24     Temp 12/04/14 1704 98.7 F (37.1 C)     Temp Source 12/04/14 1704 Oral  SpO2 12/04/14 1704 97 %     Weight 12/04/14 1704 141 lb (63.957 kg)     Height 12/04/14 1704 5\' 5"  (1.651 m)     Head Cir --      Peak Flow --      Pain Score 12/04/14 1705 10     Pain Loc --      Pain Edu? --      Excl. in [place]? --    Constitutional: Alert and oriented. Patient is tremulous in all extremities, states she feels very anxious. Eyes: Conjunctivae are normal. PERRL. EOMI. Head: Atraumatic. Nose: No congestion/rhinnorhea. Mouth/Throat: Mucous membranes are moist.  Oropharynx non-erythematous. Neck: No stridor.   Cardiovascular:  Tachycardic rate, regular rhythm. Grossly normal heart sounds.  Good peripheral circulation. Respiratory: Normal respiratory effort.  No retractions. Lungs CTAB. Gastrointestinal: Patient has moderate tenderness along the right flank and severe tenderness and right CVA.Marland Kitchen No distention.  Musculoskeletal: No lower extremity tenderness nor edema.  No joint effusions. Neurologic:  Normal speech and language. No gross focal neurologic deficits are appreciated. No gait instability. Skin:  Skin is warm, dry and intact. No rash noted. Psychiatric: Mood and affect are normal. Speech and behavior are normal.  ____________________________________________   LABS (all labs ordered are listed, but only abnormal results are displayed)  Labs Reviewed  LIPASE, BLOOD - Abnormal; Notable for the following:    Lipase 64 (*)    All other components within normal limits  COMPREHENSIVE METABOLIC PANEL - Abnormal; Notable for the following:    Chloride 100 (*)    Glucose, Bld 299 (*)    Total Protein 9.0 (*)    ALT 12 (*)    All other components within normal limits  URINALYSIS COMPLETEWITH MICROSCOPIC ([facility] ONLY) - Abnormal; Notable for the following:    Color, Urine YELLOW (*)    APPearance HAZY (*)    Glucose, UA >500 (*)    Ketones, ur 1+ (*)    Protein, ur 100 (*)    Leukocytes, UA 2+ (*)    Bacteria, UA RARE (*)    Squamous Epithelial / LPF 6-30 (*)    All other components within normal limits  LACTIC ACID, PLASMA - Abnormal; Notable for the following:    Lactic Acid, Venous 4.4 (*)    All other components within normal limits  CULTURE, BLOOD (ROUTINE X 2)  CULTURE, BLOOD (ROUTINE X 2)  CBC  LACTIC ACID, PLASMA  PREGNANCY, URINE   ____________________________________________  EKG  ED ECG REPORT I, Stefanee Mckell, the attending physician, personally viewed and interpreted this ECG.  Date: 12/04/2014 EKG Time: 1720 Rate: 80 Rhythm: normal sinus rhythm QRS Axis: normal Intervals:  normal ST/T Wave abnormalities: normal Conduction Disutrbances: none Narrative Interpretation: unremarkable  ____________________________________________  RADIOLOGY  Reviewed CT scan from last ED visit, concerning for possible pyelonephritis ____________________________________________   PROCEDURES  Procedure(s) performed: None  Critical Care performed: No  ____________________________________________   INITIAL IMPRESSION / ASSESSMENT AND PLAN / ED COURSE  Pertinent labs & imaging results that were available during my care of the patient were reviewed by me and considered in my medical decision making (see chart for details).  Patient presents with ongoing right flank pain now with worsening nausea and vomiting. Diagnosed with pyelonephritis with pansensitive Escherichia coli, but the patient did not pick up any prescription for her cephalexin. Her symptoms seem to be worsening. She is also extremely anxious, tremulous in all extremities. We have given the patient Ativan, she is calm  down well.  Primary concern is for worsening pyelonephritis with nausea vomiting and dehydration. ____________________________________________   FINAL CLINICAL IMPRESSION(S) / ED DIAGNOSES  Final diagnoses:  Pyelonephritis  Sepsis, due to unspecified organism  Anxiety      Delman Kitten, MD 12/04/14 1944

## 2014-12-04 NOTE — ED Notes (Signed)
Patient contact is Edwyna Perfect (cousin) [phone]

## 2014-12-05 DIAGNOSIS — F419 Anxiety disorder, unspecified: Secondary | ICD-10-CM | POA: Diagnosis present

## 2014-12-05 LAB — BASIC METABOLIC PANEL
Anion gap: 15 (ref 5–15)
BUN: 14 mg/dL (ref 6–20)
CHLORIDE: 105 mmol/L (ref 101–111)
CO2: 21 mmol/L — AB (ref 22–32)
CREATININE: 0.53 mg/dL (ref 0.44–1.00)
Calcium: 9.5 mg/dL (ref 8.9–10.3)
GFR calc Af Amer: >60 mL/min (ref >60)
GFR calc non Af Amer: >60 mL/min (ref >60)
Glucose, Bld: 257 mg/dL — ABNORMAL HIGH (ref 65–99)
Potassium: 3.9 mmol/L (ref 3.5–5.1)
Sodium: 141 mmol/L (ref 135–145)

## 2014-12-05 LAB — GLUCOSE, CAPILLARY
GLUCOSE-CAPILLARY: 270 mg/dL — AB (ref 65–99)
GLUCOSE-CAPILLARY: 179 mg/dL — AB (ref 65–99)
GLUCOSE-CAPILLARY: 99 mg/dL (ref 65–99)
Glucose-Capillary: 154 mg/dL — ABNORMAL HIGH (ref 65–99)
Glucose-Capillary: 316 mg/dL — ABNORMAL HIGH (ref 65–99)

## 2014-12-05 LAB — CBC
HEMATOCRIT: 39.8 % (ref 35.0–47.0)
Hemoglobin: 12.8 g/dL (ref 12.0–16.0)
MCH: 27.7 pg (ref 26.0–34.0)
MCHC: 32.3 g/dL (ref 32.0–36.0)
MCV: 85.8 fL (ref 80.0–100.0)
PLATELETS: 258 10*3/uL (ref 150–440)
RBC: 4.64 MIL/uL (ref 3.80–5.20)
RDW: 13.7 % (ref 11.5–14.5)
WBC: 13.3 10*3/uL — ABNORMAL HIGH (ref 3.6–11.0)

## 2014-12-05 LAB — HEMOGLOBIN A1C: Hgb A1c MFr Bld: 10.7 % — ABNORMAL HIGH (ref 4.0–6.0)

## 2014-12-05 LAB — PREGNANCY, URINE: Preg Test, Ur: NEGATIVE

## 2014-12-05 LAB — LACTIC ACID, PLASMA: Lactic Acid, Venous: 2.6 mmol/L (ref 0.5–2.0)

## 2014-12-05 MED ORDER — SCOPOLAMINE 1 MG/3DAYS TD PT72
1.0000 | MEDICATED_PATCH | TRANSDERMAL | Status: DC
  Administered 2014-12-05: 1.5 mg via TRANSDERMAL
  Filled 2014-12-05: qty 1

## 2014-12-05 MED ORDER — DEXTROSE 5 % IV SOLN
1.0000 g | INTRAVENOUS | Status: DC
  Administered 2014-12-05: 1 g via INTRAVENOUS
  Filled 2014-12-05 (×2): qty 10

## 2014-12-05 MED ORDER — LORAZEPAM 2 MG/ML IJ SOLN
2.0000 mg | INTRAMUSCULAR | Status: DC | PRN
  Administered 2014-12-05 (×3): 2 mg via INTRAVENOUS
  Filled 2014-12-05 (×4): qty 1

## 2014-12-05 MED ORDER — MORPHINE SULFATE (PF) 2 MG/ML IV SOLN
2.0000 mg | INTRAVENOUS | Status: DC | PRN
  Administered 2014-12-05 – 2014-12-06 (×5): 2 mg via INTRAVENOUS
  Filled 2014-12-05 (×6): qty 1

## 2014-12-05 MED ORDER — SODIUM CHLORIDE 0.9 % IV SOLN
750.0000 mg | Freq: Two times a day (BID) | INTRAVENOUS | Status: DC
  Administered 2014-12-05 – 2014-12-06 (×2): 750 mg via INTRAVENOUS
  Filled 2014-12-05 (×5): qty 7.5

## 2014-12-05 NOTE — Progress Notes (Signed)
Patient is in bed with violent shaking of the upper and lower extremities.  She remains awake during this episode.  She is also able to talk and states "I can't stop shaking." Ativan was administered per order.  Patient also became tearful and apologetic as the shaking eased off.

## 2014-12-05 NOTE — Progress Notes (Signed)
Dr. Anselm Jungling was contacted.  After 2mg  of IV ativan nearly 30 minutes ago, patient is still having the intermittent shaking.  He was also notified that she has not been able to eat anything this shift and anything she has sipped on she has thrown it up.  Keppra is ordered orally bid.  Dr. Anselm Jungling to place orders and will come see patient.

## 2014-12-05 NOTE — Progress Notes (Signed)
Dr Lavetta Nielsen notified of pt crying,thrashing about in bed arms, legs flailing, orders given

## 2014-12-05 NOTE — Consult Note (Signed)
Urology Consult  Referring physician: Dr. Bridgett Larsson Reason for referral: recurrent UTI   I have reviewed the patients medical record. She is currently admitted with UTI and pyelonephritis after failing outpatient management due to noncompliance with her antibiotics. She will need a formal recurrent UTI workup which can be accomplished as an outpatient. Please schedule an appointment at discharge with [facility] by calling [phone] .  Please call with any additional questions  Lanore Renderos L

## 2014-12-05 NOTE — Progress Notes (Signed)
Inpatient Diabetes Program Recommendations  AACE/ADA: New Consensus Statement on Inpatient Glycemic Control (2013)  Target Ranges:  Prepandial:   less than 140 mg/dL      Peak postprandial:   less than 180 mg/dL (1-2 hours)      Critically ill patients:  140 - 180 mg/dL   Results for ZENITA, KISTER (MRN [id number]) as of 12/05/2014 09:39  Ref. Range 12/04/2014 20:51 12/05/2014 00:10 12/05/2014 07:26  Glucose-Capillary Latest Ref Range: 65-99 mg/dL 331 (H) 316 (H) 270 (H)    Diabetes history: DM2 Outpatient Diabetes medications: 70/30 25 units QAM, 70/30 22 units QPM Current orders for Inpatient glycemic control: Lantus 22 units QHS, Novolog 0-9 units TID with meals, Novolog 0-5 units HS  Inpatient Diabetes Program Recommendations Insulin - Basal: Please consider increasing Lantus to 25 units QHS (based on 63 kg x 0.4 units). Insulin - Meal Coverage: May want to consider ordering Novolog 4 units TID with meals for meal coverage if patient is eating at least 50% of meals. HgbA1C: A1C is in process.  Thanks, Barnie Alderman, RN, MSN, CCRN, CDE Diabetes Coordinator Inpatient Diabetes Program [phone] (Team Pager from [place] to [place]) [phone] ([facility] office) [phone] [facility] office) [phone] [facility] office)

## 2014-12-05 NOTE — Progress Notes (Signed)
Dr Marcille Blanco notified of Lactic acid 2.6, pt crying, shaking, thrashing in the bed. Md acknowledged.

## 2014-12-05 NOTE — Progress Notes (Signed)
Dr Lavetta Nielsen notified of Lactic acid 2.5. acknowledge

## 2014-12-05 NOTE — Progress Notes (Signed)
[place] at [place] NAME: Quintasia Theroux    MR#:  [id number]  DATE OF BIRTH:  [birth year]  SUBJECTIVE:  CHIEF COMPLAINT:   Chief Complaint  Patient presents with  . Abdominal Pain    recently treated for UTI from ER- came back with severe flank pain, CT last week was showing perinephric changes. This is 4th UTI episode this year.   She is currently shaking and very anxious due to pain. Vomited her food and all meds this morning.  REVIEW OF SYSTEMS:  CONSTITUTIONAL: No fever, fatigue or weakness.  EYES: No blurred or double vision.  EARS, NOSE, AND THROAT: No tinnitus or ear pain.  RESPIRATORY: No cough, shortness of breath, wheezing or hemoptysis.  CARDIOVASCULAR: No chest pain, orthopnea, edema.  GASTROINTESTINAL: positive for nausea, vomiting, no diarrhea , but have severe abdominal pain.  GENITOURINARY: No dysuria, hematuria. Have flank pain. ENDOCRINE: No polyuria, nocturia,  HEMATOLOGY: No anemia, easy bruising or bleeding SKIN: No rash or lesion. MUSCULOSKELETAL: No joint pain or arthritis.   NEUROLOGIC: No tingling, numbness, weakness.  PSYCHIATRY: No anxiety or depression.   ROS  DRUG ALLERGIES:   Allergies  Allergen Reactions  . Doxycycline Swelling  . Tylenol [Acetaminophen] Rash    VITALS:  Blood pressure 93/49, pulse 104, temperature 98.7 F (37.1 C), temperature source Oral, resp. rate 21, height 5\' 5"  (1.651 m), weight 63.957 kg (141 lb), last menstrual period 11/13/2014, SpO2 100 %.  PHYSICAL EXAMINATION:  GENERAL:  34 y.o.-year-old patient lying in the bed with no acute distress.  EYES: Pupils equal, round, reactive to light and accommodation. No scleral icterus. Extraocular muscles intact.  HEENT: Head atraumatic, normocephalic. Oropharynx and nasopharynx clear.  NECK:  Supple, no jugular venous distention. No thyroid enlargement, no tenderness.  LUNGS: Normal breath sounds bilaterally, no wheezing,  rales,rhonchi or crepitation. No use of accessory muscles of respiration.  CARDIOVASCULAR: S1, S2 normal. No murmurs, rubs, or gallops.  ABDOMEN: Soft,  Mild tender, nondistended. Bowel sounds present. No organomegaly or mass. Tender on both Costovertebral angles. EXTREMITIES: No pedal edema, cyanosis, or clubbing.  NEUROLOGIC: Cranial nerves II through XII are intact. Muscle strength 5/5 in all extremities. Sensation intact. Gait not checked. Pt have lot of shaking in whole body, she is completely alert and talking to me, and stopped shaking when I asked about her details of previous UTIs and Sexual history, after I finished interviewing her - she started back shaking. PSYCHIATRIC: The patient is alert and oriented x 3.  SKIN: No obvious rash, lesion, or ulcer.   Physical Exam LABORATORY PANEL:   CBC  Recent Labs Lab 12/05/14 0546  WBC 13.3*  HGB 12.8  HCT 39.8  PLT 258   ------------------------------------------------------------------------------------------------------------------  Chemistries   Recent Labs Lab 12/04/14 1739 12/04/14 2126 12/05/14 0447  NA 136  --  141  K 3.7  --  3.9  CL 100*  --  105  CO2 22  --  21*  GLUCOSE 299*  --  257*  BUN 10  --  14  CREATININE 0.63  --  0.53  CALCIUM 9.9  --  9.5  MG  --  1.4*  --   AST 26  --   --   ALT 12*  --   --   ALKPHOS 50  --   --   BILITOT 0.8  --   --    ------------------------------------------------------------------------------------------------------------------  Cardiac Enzymes No results for input(s): TROPONINI in the  last 168 hours. ------------------------------------------------------------------------------------------------------------------  RADIOLOGY:  No results found.  ASSESSMENT AND PLAN:   * Pyelonephritis   Peripelvic strandring in previous CT 1 week ago,   Urine cx reviewed- on rocephin IV>   This is 4th episode with UTI this year in this young female, I called Urology  consult.  * lactic acidosis   Likely due to decreased circulation and volume- slight hypotension   Lactic acid is better today, On IV fluids.  * Nausea and vomit   Symptomatic management  * Uncontrolled DM   Likely due to infection.   Continue lantus + Sliding scale coverage.  * seizure   On keppra oral, vomited today, so swiched to IV.  * excessive tremors  She is fully alert and stopped shaking while I was interviewing her.   I called psychiatry consult.      All the records are reviewed and case discussed with Care Management/Social Workerr. Management plans discussed with the patient, family and they are in agreement.  CODE STATUS: full.  TOTAL TIME TAKING CARE OF THIS PATIENT: 35 minutes.    POSSIBLE D/C IN 2-3 DAYS, DEPENDING ON CLINICAL CONDITION.   Vaughan Basta M.D on 12/05/2014   Between 7am to 6pm - Pager - [phone]  After 6pm go to [email or web address] - password [email or web address] [facility]  [facility] [place] [facility]  Office  [phone]  CC: Primary care physician; No PCP Per Patient

## 2014-12-05 NOTE — Consult Note (Signed)
[facility] Face-to-Face Psychiatry Consult   Reason for Consult:  Consult for this 34 year old woman in the hospital with recurrent urinary tract infections. Consult over concerns about her trembling behavior Referring Physician:  Anselm Jungling Patient Identification: Rachael Gray MRN:  [id number] Principal Diagnosis: Adjustment disorder with anxiety Diagnosis:   Patient Active Problem List   Diagnosis Date Noted  . Pyelonephritis [N12] 12/04/2014  . Lactic acidosis [E87.2] 12/04/2014    Class: Acute    Total Time spent with patient: 45 minutes  Subjective:   Rachael Gray is a 34 y.o. female patient admitted with "my stomach is hurting".  HPI:  Information from the patient and the chart. I have met this patient several times previously for the same question during other hospitalizations. Patient states that she came into the hospital because of abdominal pain. She appears to have pyelonephritis. Has had multiple recurrent urinary tract infections. The concern was about a behavior she has in which she will shake all or part of her body while maintaining consciousness. I questioned her about it and she admits that she continues to do it although she was not doing it when I spoke with her. She identifies it with anxiety and physical distress. She does not specifically make any identification with "seizures" and this sort of behavior. She is much more concerned about her abdominal discomfort. Patient states that she has been compliant with her medicine. Has not been abusing drugs. Denies depression denies suicidal ideation denies psychotic symptoms.  Past psychiatric history: This is either the third or fourth time that I have seen her for this condition. My conclusion back in March was that the shaking behavior is simply a way that she has of expressing distress. She is aware of it and is able to control it. I don't think that it meets criteria for a factitious or conversion symptom but is simply a  dramatic way she has of showing her discomfort. I'm not even sure that we should use the term pseudoseizures for this behavior since again she is completely aware of it and is not necessarily trying to express "seizures". Other than that there is no specific mental health condition identified. No history of suicidality or hospitalization.  Social history: Patient lives with her mother. When she is not acutely sick she is employed.  Family history: Nonidentified  Substance abuse history: Nonidentified  Medical history: Patient has diabetes. She's had a history of rent abdominal pain which at times has been resistant to diagnosis. This time she seems to be having recurrent urinary tract infections. HPI Elements:   Quality:  Trembling behavior. Severity:  Mild to moderate. Timing:  Occurs when she is in pain or distress especially when she is trying to express that to a professional. Duration:  Intermittent currently in remission. Context:  Acute illness.  Past Medical History:  Past Medical History  Diagnosis Date  . Diabetes mellitus without complication   . Seizures     Past Surgical History  Procedure Laterality Date  . Appendectomy    . Cholecystectomy     Family History: No family history on file. Social History:  History  Alcohol Use  . Yes     History  Drug Use No    Social History   Social History  . Marital Status: Single    Spouse Name: N/A  . Number of Children: N/A  . Years of Education: N/A   Social History Main Topics  . Smoking status: Former Smoker -- 2.00 packs/day  Types: Cigarettes  . Smokeless tobacco: None  . Alcohol Use: Yes  . Drug Use: No  . Sexual Activity: Not Asked   Other Topics Concern  . None   Social History Narrative   Additional Social History:                          Allergies:   Allergies  Allergen Reactions  . Doxycycline Swelling  . Tylenol [Acetaminophen] Rash    Labs:  Results for orders placed or  performed during the hospital encounter of 12/04/14 (from the past 48 hour(s))  Lipase, blood     Status: Abnormal   Collection Time: 12/04/14  5:39 PM  Result Value Ref Range   Lipase 64 (H) 22 - 51 U/L  Comprehensive metabolic panel     Status: Abnormal   Collection Time: 12/04/14  5:39 PM  Result Value Ref Range   Sodium 136 135 - 145 mmol/L   Potassium 3.7 3.5 - 5.1 mmol/L   Chloride 100 (L) 101 - 111 mmol/L   CO2 22 22 - 32 mmol/L   Glucose, Bld 299 (H) 65 - 99 mg/dL   BUN 10 6 - 20 mg/dL   Creatinine, Ser 0.63 0.44 - 1.00 mg/dL   Calcium 9.9 8.9 - 10.3 mg/dL   Total Protein 9.0 (H) 6.5 - 8.1 g/dL   Albumin 4.5 3.5 - 5.0 g/dL   AST 26 15 - 41 U/L   ALT 12 (L) 14 - 54 U/L   Alkaline Phosphatase 50 38 - 126 U/L   Total Bilirubin 0.8 0.3 - 1.2 mg/dL   GFR calc non Af Amer >60 >60 mL/min   GFR calc Af Amer >60 >60 mL/min    Comment: (NOTE) The eGFR has been calculated using the CKD EPI equation. This calculation has not been validated in all clinical situations. eGFR's persistently <60 mL/min signify possible Chronic Kidney Disease.    Anion gap 14 5 - 15  CBC     Status: None   Collection Time: 12/04/14  5:39 PM  Result Value Ref Range   WBC 10.3 3.6 - 11.0 K/uL   RBC 5.11 3.80 - 5.20 MIL/uL   Hemoglobin 14.5 12.0 - 16.0 g/dL   HCT 43.4 35.0 - 47.0 %   MCV 84.9 80.0 - 100.0 fL   MCH 28.3 26.0 - 34.0 pg   MCHC 33.3 32.0 - 36.0 g/dL   RDW 13.7 11.5 - 14.5 %   Platelets 269 150 - 440 K/uL  Lactic acid, plasma     Status: Abnormal   Collection Time: 12/04/14  5:40 PM  Result Value Ref Range   Lactic Acid, Venous 4.4 (HH) 0.5 - 2.0 mmol/L    Comment: CRITICAL RESULT CALLED TO, READ BACK BY AND VERIFIED WITH KENDALL MOFFITT @ 1816 ON 12/04/2014 BY CAF   Blood culture (routine x 2)     Status: None (Preliminary result)   Collection Time: 12/04/14  5:40 PM  Result Value Ref Range   Specimen Description BLOOD RIGHT ASSIST CONTROL    Special Requests BOTTLES DRAWN AEROBIC  AND ANAEROBIC  5CC    Culture NO GROWTH < 24 HOURS    Report Status PENDING   Blood culture (routine x 2)     Status: None (Preliminary result)   Collection Time: 12/04/14  5:40 PM  Result Value Ref Range   Specimen Description BLOOD RIGHT HAND    Special Requests  BOTTLES DRAWN AEROBIC AND ANAEROBIC  AER 3CC ANA 5CC   Culture NO GROWTH < 24 HOURS    Report Status PENDING   Urinalysis complete, with microscopic ([facility] only)     Status: Abnormal   Collection Time: 12/04/14  5:41 PM  Result Value Ref Range   Color, Urine YELLOW (A) YELLOW   APPearance HAZY (A) CLEAR   Glucose, UA >500 (A) NEGATIVE mg/dL   Bilirubin Urine NEGATIVE NEGATIVE   Ketones, ur 1+ (A) NEGATIVE mg/dL   Specific Gravity, Urine 1.029 1.005 - 1.030   Hgb urine dipstick NEGATIVE NEGATIVE   pH 6.0 5.0 - 8.0   Protein, ur 100 (A) NEGATIVE mg/dL   Nitrite NEGATIVE NEGATIVE   Leukocytes, UA 2+ (A) NEGATIVE   RBC / HPF 6-30 0 - 5 RBC/hpf   WBC, UA 6-30 0 - 5 WBC/hpf   Bacteria, UA RARE (A) NONE SEEN   Squamous Epithelial / LPF 6-30 (A) NONE SEEN   Mucous PRESENT   Glucose, capillary     Status: Abnormal   Collection Time: 12/04/14  8:51 PM  Result Value Ref Range   Glucose-Capillary 331 (H) 65 - 99 mg/dL  Lactic acid, plasma     Status: Abnormal   Collection Time: 12/04/14  9:26 PM  Result Value Ref Range   Lactic Acid, Venous 2.5 (HH) 0.5 - 2.0 mmol/L    Comment: CRITICAL RESULT CALLED TO, READ BACK BY AND VERIFIED WITH TRISHA KING AT 2202 12/04/14.PMH  Hemoglobin A1c     Status: Abnormal   Collection Time: 12/04/14  9:26 PM  Result Value Ref Range   Hgb A1c MFr Bld 10.7 (H) 4.0 - 6.0 %  Magnesium     Status: Abnormal   Collection Time: 12/04/14  9:26 PM  Result Value Ref Range   Magnesium 1.4 (L) 1.7 - 2.4 mg/dL  Glucose, capillary     Status: Abnormal   Collection Time: 12/05/14 12:10 AM  Result Value Ref Range   Glucose-Capillary 316 (H) 65 - 99 mg/dL  Pregnancy, urine     Status: None    Collection Time: 12/05/14 12:46 AM  Result Value Ref Range   Preg Test, Ur NEGATIVE NEGATIVE  Basic metabolic panel     Status: Abnormal   Collection Time: 12/05/14  4:47 AM  Result Value Ref Range   Sodium 141 135 - 145 mmol/L   Potassium 3.9 3.5 - 5.1 mmol/L   Chloride 105 101 - 111 mmol/L   CO2 21 (L) 22 - 32 mmol/L   Glucose, Bld 257 (H) 65 - 99 mg/dL   BUN 14 6 - 20 mg/dL   Creatinine, Ser 0.53 0.44 - 1.00 mg/dL   Calcium 9.5 8.9 - 10.3 mg/dL   GFR calc non Af Amer >60 >60 mL/min   GFR calc Af Amer >60 >60 mL/min    Comment: (NOTE) The eGFR has been calculated using the CKD EPI equation. This calculation has not been validated in all clinical situations. eGFR's persistently <60 mL/min signify possible Chronic Kidney Disease.    Anion gap 15 5 - 15  CBC     Status: Abnormal   Collection Time: 12/05/14  5:46 AM  Result Value Ref Range   WBC 13.3 (H) 3.6 - 11.0 K/uL   RBC 4.64 3.80 - 5.20 MIL/uL   Hemoglobin 12.8 12.0 - 16.0 g/dL   HCT 39.8 35.0 - 47.0 %   MCV 85.8 80.0 - 100.0 fL   MCH 27.7 26.0 - 34.0  pg   MCHC 32.3 32.0 - 36.0 g/dL   RDW 13.7 11.5 - 14.5 %   Platelets 258 150 - 440 K/uL  Lactic acid, plasma     Status: Abnormal   Collection Time: 12/05/14  5:46 AM  Result Value Ref Range   Lactic Acid, Venous 2.6 (HH) 0.5 - 2.0 mmol/L    Comment: CRITICAL RESULT CALLED TO, READ BACK BY AND VERIFIED WITH TRISHA KING AT 3154 12/05/14.PMH  Glucose, capillary     Status: Abnormal   Collection Time: 12/05/14  7:26 AM  Result Value Ref Range   Glucose-Capillary 270 (H) 65 - 99 mg/dL  Glucose, capillary     Status: Abnormal   Collection Time: 12/05/14 11:15 AM  Result Value Ref Range   Glucose-Capillary 179 (H) 65 - 99 mg/dL  Glucose, capillary     Status: None   Collection Time: 12/05/14  4:29 PM  Result Value Ref Range   Glucose-Capillary 99 65 - 99 mg/dL  Glucose, capillary     Status: Abnormal   Collection Time: 12/05/14  9:11 PM  Result Value Ref Range    Glucose-Capillary 154 (H) 65 - 99 mg/dL   Comment 1 Notify RN     Vitals: Blood pressure 93/69, pulse 79, temperature 98.3 F (36.8 C), temperature source Oral, resp. rate 18, height 5' 5"  (1.651 m), weight 63.957 kg (141 lb), last menstrual period 11/13/2014, SpO2 100 %.  Risk to Self: Is patient at risk for suicide?: No Risk to Others:   Prior Inpatient Therapy:   Prior Outpatient Therapy:    Current Facility-Administered Medications  Medication Dose Route Frequency Provider Last Rate Last Dose  . albuterol (PROVENTIL) (2.5 MG/3ML) 0.083% nebulizer solution 2.5 mg  2.5 mg Nebulization Q2H PRN Demetrios Loll, MD      . ALPRAZolam Duanne Moron) tablet 0.25 mg  0.25 mg Oral TID PRN Demetrios Loll, MD      . cefTRIAXone (ROCEPHIN) 1 g in dextrose 5 % 50 mL IVPB  1 g Intravenous Q24H Vaughan Basta, MD   1 g at 12/05/14 2044  . heparin injection 5,000 Units  5,000 Units Subcutaneous 3 times per day Demetrios Loll, MD   5,000 Units at 12/05/14 0511  . insulin aspart (novoLOG) injection 0-5 Units  0-5 Units Subcutaneous QHS Demetrios Loll, MD   4 Units at 12/05/14 0018  . insulin aspart (novoLOG) injection 0-9 Units  0-9 Units Subcutaneous TID WC Demetrios Loll, MD   2 Units at 12/05/14 1319  . insulin glargine (LANTUS) injection 22 Units  22 Units Subcutaneous QHS Demetrios Loll, MD   22 Units at 12/05/14 0017  . ketorolac (TORADOL) 30 MG/ML injection 30 mg  30 mg Intravenous Q6H PRN Demetrios Loll, MD   30 mg at 12/05/14 1213  . levETIRAcetam (KEPPRA) 750 mg in sodium chloride 0.9 % 100 mL IVPB  750 mg Intravenous Q12H Vaughan Basta, MD   750 mg at 12/05/14 1515  . LORazepam (ATIVAN) injection 2 mg  2 mg Intravenous Q4H PRN Harrie Foreman, MD   2 mg at 12/05/14 1304  . morphine 2 MG/ML injection 2 mg  2 mg Intravenous Q4H PRN Vaughan Basta, MD   2 mg at 12/05/14 2043  . ondansetron (ZOFRAN) tablet 4 mg  4 mg Oral Q4H PRN Lytle Butte, MD       Or  . ondansetron [facility]) injection 4 mg  4 mg Intravenous Q4H  PRN Lytle Butte, MD   4 mg at  12/05/14 1123  . oxyCODONE (Oxy IR/ROXICODONE) immediate release tablet 5 mg  5 mg Oral Q4H PRN Demetrios Loll, MD      . promethazine (PHENERGAN) injection 12.5 mg  12.5 mg Intravenous Q6H PRN Lytle Butte, MD   12.5 mg at 12/05/14 1536  . scopolamine (TRANSDERM-SCOP) 1 MG/3DAYS 1.5 mg  1 patch Transdermal Q72H Harrie Foreman, MD   1.5 mg at 12/05/14 7829    Musculoskeletal: Strength & Muscle Tone: within normal limits Gait & Station: unable to stand Patient leans: N/A  Psychiatric Specialty Exam: Physical Exam  Nursing note and vitals reviewed. Constitutional: She appears well-developed and well-nourished.  HENT:  Head: Normocephalic and atraumatic.  Eyes: Conjunctivae are normal. Pupils are equal, round, and reactive to light.  Neck: Normal range of motion.  Cardiovascular: Normal heart sounds.   Respiratory: Effort normal.  GI: Soft.  Musculoskeletal: Normal range of motion.  Neurological: She is alert.  Skin: Skin is warm and dry.  Psychiatric: She has a normal mood and affect. Judgment and thought content normal. Her speech is delayed. She is slowed. Cognition and memory are normal.    Review of Systems  Constitutional: Negative.   HENT: Negative.   Eyes: Negative.   Respiratory: Negative.   Cardiovascular: Negative.   Gastrointestinal: Positive for nausea, vomiting and abdominal pain.  Musculoskeletal: Negative.   Skin: Negative.   Neurological: Negative.   Psychiatric/Behavioral: Negative for depression, suicidal ideas, hallucinations and substance abuse. The patient is nervous/anxious. The patient does not have insomnia.     Blood pressure 93/69, pulse 79, temperature 98.3 F (36.8 C), temperature source Oral, resp. rate 18, height 5' 5"  (1.651 m), weight 63.957 kg (141 lb), last menstrual period 11/13/2014, SpO2 100 %.Body mass index is 23.46 kg/(m^2).  General Appearance: Disheveled  Eye Sport and exercise psychologist::  Fair  Speech:  Slow  Volume:   Decreased  Mood:  Dysphoric  Affect:  Appropriate  Thought Process:  Intact  Orientation:  Full (Time, Place, and Person)  Thought Content:  Negative  Suicidal Thoughts:  No  Homicidal Thoughts:  No  Memory:  Immediate;   Good Recent;   Good Remote;   Good  Judgement:  Fair  Insight:  Fair  Psychomotor Activity:  Decreased  Concentration:  Fair  Recall:  Good  Fund of Knowledge:Good  Language: Good  Akathisia:  No  Handed:  Right  AIMS (if indicated):     Assets:  Communication Skills Desire for Improvement Resilience Social Support  ADL's:  Intact  Cognition: WNL  Sleep:      Medical Decision Making: Review of Psycho-Social Stressors (1), Established Problem, Worsening (2) and Review of Medication Regimen & Side Effects (2)  Treatment Plan Summary: Plan This patient's behavior in which she will shake or tremble sometimes quite dramatically without a change in consciousness does not require any treatment. She seems to be able to function fine at work and socially when she is healthy. This behavior appears to come out really just when she is acutely sick and in the hospital and especially in physical discomfort. There is no treatment indicated. Patient is maintained on Keppra out of what is probably in an abundance of caution given that neurology consultants in the past have also not believe that she has seizures. I don't think there is really any evidence that the patient has a true seizure disorder but I will not take the liberty of adjusting her anticonvulsives. No other psychiatric treatment needed at this point.  Plan:  Patient does not meet criteria for psychiatric inpatient admission. Supportive therapy provided about ongoing stressors. Disposition: Supportive counseling. No further treatment.  John Clapacs 12/05/2014 9:28 PM

## 2014-12-06 LAB — URINE CULTURE

## 2014-12-06 LAB — GLUCOSE, CAPILLARY
GLUCOSE-CAPILLARY: 291 mg/dL — AB (ref 65–99)
GLUCOSE-CAPILLARY: 102 mg/dL — AB (ref 65–99)
GLUCOSE-CAPILLARY: 167 mg/dL — AB (ref 65–99)
Glucose-Capillary: 144 mg/dL — ABNORMAL HIGH (ref 65–99)
Glucose-Capillary: 180 mg/dL — ABNORMAL HIGH (ref 65–99)

## 2014-12-06 MED ORDER — ENOXAPARIN SODIUM 40 MG/0.4ML ~~LOC~~ SOLN
40.0000 mg | SUBCUTANEOUS | Status: DC
  Administered 2014-12-06: 40 mg via SUBCUTANEOUS
  Filled 2014-12-06: qty 0.4

## 2014-12-06 MED ORDER — OXYCODONE HCL 5 MG PO TABS
5.0000 mg | ORAL_TABLET | ORAL | Status: DC | PRN
  Administered 2014-12-06 (×2): 5 mg via ORAL
  Filled 2014-12-06 (×2): qty 1

## 2014-12-06 MED ORDER — LEVETIRACETAM 750 MG PO TABS
750.0000 mg | ORAL_TABLET | Freq: Two times a day (BID) | ORAL | Status: DC
  Administered 2014-12-06 (×2): 750 mg via ORAL
  Filled 2014-12-06 (×3): qty 1

## 2014-12-06 MED ORDER — CEFUROXIME AXETIL 500 MG PO TABS
250.0000 mg | ORAL_TABLET | Freq: Two times a day (BID) | ORAL | Status: DC
  Administered 2014-12-06: 250 mg via ORAL
  Filled 2014-12-06 (×3): qty 1

## 2014-12-06 NOTE — Progress Notes (Signed)
Spoke with patient about diabetes and home regimen for diabetes control. Patient reports that she does not have a PCP or insurance. Patient states that she takes care of her diabetes herself. She reports that currently she takes Novolin 70/30 25 units QAM and 70/30 20 units QPM as an outpatient for diabetes control. Patient currently purchases Novolin 70/30 from [facility] for $25 per vial without a prescription.  Patient states that she checks her glucose twice a day and "it is good". Asked what specific glucose numbers were most commonly seen when she checks her glucose. Patient reports that glucose ranges from 200-300's mg/dl. Informed patient of normal glucose ranges from 80-130 mg/dl.  Inquired about knowledge about A1C and patient reports that she does not know what an A1C is. Discussed A1C results (10.7% on 12/04/14) and explained what an A1C is, basic pathophysiology of DM Type 2, basic home care, importance of checking CBGs and maintaining good CBG control to prevent long-term and short-term complications. Explained to patient that current A1C indicates an average glucose of 260 mg/dl which can lead to damage within inner lining of blood vessels which leads to potential complications seen with diabetes. Informed patient that she needed help with improving her diabetes control. Discussed impact of nutrition, exercise, stress, sickness, and medications on diabetes control.  Inquired about diet and patient states that she "eats whatever she wants to". Discussed carbohydrates and carbohydrate goals per day and meal and patient reports that she "does not plan to change her diet and will continue to eat what she wants". Encouraged patient to watch carbohydrates consumed and to cut out sugary drinks.  Inquired about patient's willingness to follow up and patient states that she is interested in following up with a doctor after discharge.  Informed patient I will ask that Case Manager talk with her about setting up  follow up care. Patient verbalized understanding of information discussed and she states that she has no further questions at this time related to diabetes. Talked with Liliane Channel, [facility] and he will assess patient's needs and try to arrange follow up care.  Thanks, Barnie Alderman, RN, MSN, CCRN, CDE Diabetes Coordinator Inpatient Diabetes Program [phone] (Team Pager) [phone] ([facility] office) [phone] [facility] office) [phone] [facility] office)

## 2014-12-06 NOTE — Consult Note (Signed)
  Psychiatry: Patient seen for follow-up. She has no new complaints. She is not doing any of her shaking this evening when I came by to see her. She tells me that her pain is a little better and she is not throwing up as much and she has been able to get up and walk. She is looking forward to possible discharge tomorrow. Supportive counseling completed. No change to diagnosis or treatment plan.

## 2014-12-06 NOTE — Progress Notes (Signed)
[place] [facility] [place] NAME: Rachael Gray    MR#:  [id number]  DATE OF BIRTH:  1980/04/06  SUBJECTIVE:  CHIEF COMPLAINT:   Chief Complaint  Patient presents with  . Abdominal Pain    recently treated for UTI from ER- came back with severe flank pain, CT last week was showing perinephric changes. This is 4th UTI episode this year.   Today, more calm, and somewhat sleepy.  REVIEW OF SYSTEMS:  CONSTITUTIONAL: No fever, fatigue or weakness.  EYES: No blurred or double vision.  EARS, NOSE, AND THROAT: No tinnitus or ear pain.  RESPIRATORY: No cough, shortness of breath, wheezing or hemoptysis.  CARDIOVASCULAR: No chest pain, orthopnea, edema.  GASTROINTESTINAL: positive for nausea, vomiting, no diarrhea , but have severe abdominal pain.  GENITOURINARY: No dysuria, hematuria. Have flank pain. ENDOCRINE: No polyuria, nocturia,  HEMATOLOGY: No anemia, easy bruising or bleeding SKIN: No rash or lesion. MUSCULOSKELETAL: No joint pain or arthritis.   NEUROLOGIC: No tingling, numbness, weakness.  PSYCHIATRY: No anxiety or depression.   ROS  DRUG ALLERGIES:   Allergies  Allergen Reactions  . Doxycycline Swelling  . Tylenol [Acetaminophen] Rash    VITALS:  Blood pressure 112/70, pulse 81, temperature 98 F (36.7 C), temperature source Oral, resp. rate 16, height 5\' 5"  (1.651 m), weight 63.957 kg (141 lb), last menstrual period 11/13/2014, SpO2 100 %.  PHYSICAL EXAMINATION:  GENERAL:  34 y.o.-year-old patient lying in the bed with no acute distress.  EYES: Pupils equal, round, reactive to light and accommodation. No scleral icterus. Extraocular muscles intact.  HEENT: Head atraumatic, normocephalic. Oropharynx and nasopharynx clear.  NECK:  Supple, no jugular venous distention. No thyroid enlargement, no tenderness.  LUNGS: Normal breath sounds bilaterally, no wheezing, rales,rhonchi or crepitation. No use of accessory muscles of  respiration.  CARDIOVASCULAR: S1, S2 normal. No murmurs, rubs, or gallops.  ABDOMEN: Soft,  Mild tender, nondistended. Bowel sounds present. No organomegaly or mass. Tender on both Costovertebral angles. EXTREMITIES: No pedal edema, cyanosis, or clubbing.  NEUROLOGIC: Cranial nerves II through XII are intact. Muscle strength 5/5 in all extremities. Sensation intact. Gait not checked.  PSYCHIATRIC: The patient is alert and oriented x 3.  SKIN: No obvious rash, lesion, or ulcer.   Physical Exam LABORATORY PANEL:   CBC  Recent Labs Lab 12/05/14 0546  WBC 13.3*  HGB 12.8  HCT 39.8  PLT 258   ------------------------------------------------------------------------------------------------------------------  Chemistries   Recent Labs Lab 12/04/14 1739 12/04/14 2126 12/05/14 0447  NA 136  --  141  K 3.7  --  3.9  CL 100*  --  105  CO2 22  --  21*  GLUCOSE 299*  --  257*  BUN 10  --  14  CREATININE 0.63  --  0.53  CALCIUM 9.9  --  9.5  MG  --  1.4*  --   AST 26  --   --   ALT 12*  --   --   ALKPHOS 50  --   --   BILITOT 0.8  --   --    ------------------------------------------------------------------------------------------------------------------  Cardiac Enzymes No results for input(s): TROPONINI in the last 168 hours. ------------------------------------------------------------------------------------------------------------------  RADIOLOGY:  No results found.  ASSESSMENT AND PLAN:   * Pyelonephritis   Peripelvic strandring in previous CT 1 week ago,   Urine cx reviewed- on rocephin IV>   This is 4th episode with UTI this year in this young female, I called  Urology consult. They suggested to [facility].  * lactic acidosis   Likely due to decreased circulation and volume- slight hypotension   Lactic acid is better today, On IV fluids.  * Nausea and vomit   Symptomatic management  * Uncontrolled DM   Likely due to infection.    Continue lantus + Sliding scale coverage.  * seizure   On keppra oral, vomiting stopped.    Now swiched to oral back.  * excessive tremors  She is fully alert and stopped shaking while I was interviewing her.   I called psychiatry consult.   More calm today.     All the records are reviewed and case discussed with Care Management/Social Workerr. Management plans discussed with the patient, family and they are in agreement.  CODE STATUS: full.  TOTAL TIME TAKING CARE OF THIS PATIENT: 35 minutes.    POSSIBLE D/C IN 1-2 DAYS, DEPENDING ON CLINICAL CONDITION.   Vaughan Basta M.D on 12/06/2014   Between 7am to 6pm - Pager - [phone]  After 6pm go to [email or web address] - password [email or web address] [facility]  [place] [facility]  Office  [phone]  CC: Primary care physician; No PCP Per Patient

## 2014-12-07 DIAGNOSIS — A419 Sepsis, unspecified organism: Secondary | ICD-10-CM | POA: Diagnosis present

## 2014-12-07 LAB — GLUCOSE, CAPILLARY
GLUCOSE-CAPILLARY: 159 mg/dL — AB (ref 65–99)
GLUCOSE-CAPILLARY: 97 mg/dL (ref 65–99)

## 2014-12-07 MED ORDER — CEPHALEXIN 500 MG PO CAPS: 500.0000 mg | ORAL_CAPSULE | Freq: Three times a day (TID) | ORAL | Status: AC

## 2014-12-07 NOTE — Progress Notes (Signed)
A&O. VSS. Tolerating diet well. Poor appetite. Complaints of pain, but refusing oral pain medication. Discharged per MD orders. Discharge instructions reviewed with pt and pt verbalized understanding. Prescription given to pt. Discharged ambulatory escorted by mother.

## 2014-12-07 NOTE — Progress Notes (Signed)
Patients IV went bad and charge nurse replaced it and that IV also went bad d/t patient tampering with them. Patient is very upset, yelling, screaming, cursing, and being rude. DR. Marcille Blanco notified and stated we did not need to replace IV at this time and to offer PO PRN Oxycodone for pain. Patient refused it and only wants IV pain medication.

## 2014-12-07 NOTE — Discharge Instructions (Signed)

## 2014-12-09 DIAGNOSIS — F1110 Opioid abuse, uncomplicated: Secondary | ICD-10-CM | POA: Diagnosis present

## 2014-12-09 LAB — CULTURE, BLOOD (ROUTINE X 2)
Culture: NO GROWTH
Culture: NO GROWTH

## 2014-12-09 NOTE — Discharge Summary (Signed)
[facility] NAME: Rachael Gray    MR#:  [id number]  Rachael Gray:  1980/06/04  DATE OF ADMISSION:  12/04/2014 ADMITTING PHYSICIAN: Demetrios Loll, MD  DATE OF DISCHARGE: 12/07/2014 12:32 PM  PRIMARY CARE PHYSICIAN: No PCP Per Patient    ADMISSION DIAGNOSIS:  Anxiety [F41.9] Pyelonephritis [N12] Sepsis, due to unspecified organism [A41.9]  DISCHARGE DIAGNOSIS:  Principal Problem:   Pyelonephritis Active Problems:   Lactic acidosis   Anxiety   Sepsis   Narcotic abuse   SECONDARY DIAGNOSIS:   Past Medical History  Diagnosis Date  . Diabetes mellitus without complication   . Seizures      ADMITTING HISTORY  Rachael Gray is a 34 y.o. female with a known history of diabetes and the seizure disorder. The patient came to the ED due to right flank pain 6 days ago. CAT scan of abdomen show possible pyelonephritis. Urine culture showed Escherichia coli. She was given Keflex by mouth and discharged to home. But she couldn't afford the medication and didn't take by mouth antibiotics. She has had worsening nausea, vomiting, right-sided flank pain and abdominal pain. In addition she's tachycardia at 120 to 130s. He was treated with Rocephin 1 dose in ED. she is extremely anxious and body shaking in ED. She said morphine doesn't work for her and she wants Dilaudid.she was treated with Ativan twice and Zofran once.    HOSPITAL COURSE:    patient was admitted onto medical floor. Her nausea and vomiting were due to gastroparesis. She does seem to have narcotic seeking behavior. IV pain medications were decreased in doses but patient continued to demand IV pain medications. By the day of discharge she has tolerated soft diet. Her seizure medications and antibiotics have been switched to oral.  She does have history of noncompliance of not taking antibiotics as prescribed. I have counseled the patient on multiple occasions. She  has agreed to be compliant with antibiotics after discharge for her pyelonephritis. Afebrile by the time of discharge without any dysuria or hematuria.   Stable for discharge home.   CONSULTS OBTAINED:  Treatment Team:  Gonzella Lex, MD Cleon Gustin, MD  DRUG ALLERGIES:   Allergies  Allergen Reactions  . Doxycycline Swelling  . Tylenol [Acetaminophen] Rash    DISCHARGE MEDICATIONS:   Discharge Medication List as of 12/07/2014 12:23 PM    CONTINUE these medications which have CHANGED   Details  cephALEXin (KEFLEX) 500 MG capsule Take 1 capsule (500 mg total) by mouth 3 (three) times daily., Starting 12/07/2014, Until Sat 12/17/14, Print      CONTINUE these medications which have NOT CHANGED   Details  insulin NPH-regular Human (NOVOLIN 70/30) (70-30) 100 UNIT/ML injection Inject 22-25 Units into the skin 2 (two) times daily with a meal. 25 units in the morning and 22 units in the evening, Until Discontinued, Historical Med    levETIRAcetam (KEPPRA) 750 MG tablet Take 1 tablet (750 mg total) by mouth 2 (two) times daily., Starting 11/12/2014, Until Discontinued, Print    oxyCODONE (OXY IR/ROXICODONE) 5 MG immediate release tablet Take 1 tablet (5 mg total) by mouth every 4 (four) hours as needed for moderate pain or severe pain., Starting 11/28/2014, Until Discontinued, Print         Today    VITAL SIGNS:  Blood pressure 97/48, pulse 70, temperature 97.9 F (36.6 C), temperature source Oral, resp. rate 17, height 5\' 5"  (1.651 m), weight 63.957  kg (141 lb), last menstrual period 11/13/2014, SpO2 100 %.  I/O:  No intake or output data in the 24 hours ending 12/09/14 1311  PHYSICAL EXAMINATION:  Physical Exam  GENERAL:  34 y.o.-year-old patient lying in the bed with no acute distress.  LUNGS: Normal breath sounds bilaterally, no wheezing, rales,rhonchi or crepitation. No use of accessory muscles of respiration.  CARDIOVASCULAR: S1, S2 normal. No murmurs, rubs, or  gallops.  ABDOMEN: Soft, non-tender, non-distended. Bowel sounds present. No organomegaly or mass.  NEUROLOGIC: Moves all 4 extremities. PSYCHIATRIC: The patient is alert and oriented x 3.  SKIN: No obvious rash, lesion, or ulcer.   DATA REVIEW:   CBC  Recent Labs Lab 12/05/14 0546  WBC 13.3*  HGB 12.8  HCT 39.8  PLT 258    Chemistries   Recent Labs Lab 12/04/14 1739 12/04/14 2126 12/05/14 0447  NA 136  --  141  K 3.7  --  3.9  CL 100*  --  105  CO2 22  --  21*  GLUCOSE 299*  --  257*  BUN 10  --  14  CREATININE 0.63  --  0.53  CALCIUM 9.9  --  9.5  MG  --  1.4*  --   AST 26  --   --   ALT 12*  --   --   ALKPHOS 50  --   --   BILITOT 0.8  --   --     Cardiac Enzymes No results for input(s): TROPONINI in the last 168 hours.  Microbiology Results  Results for orders placed or performed during the hospital encounter of 12/04/14  Blood culture (routine x 2)     Status: None   Collection Time: 12/04/14  5:40 PM  Result Value Ref Range Status   Specimen Description BLOOD RIGHT ASSIST CONTROL  Final   Special Requests BOTTLES DRAWN AEROBIC AND ANAEROBIC  5CC  Final   Culture NO GROWTH 5 DAYS  Final   Report Status 12/09/2014 FINAL  Final  Blood culture (routine x 2)     Status: None   Collection Time: 12/04/14  5:40 PM  Result Value Ref Range Status   Specimen Description BLOOD RIGHT HAND  Final   Special Requests   Final    BOTTLES DRAWN AEROBIC AND ANAEROBIC  AER 3CC ANA 5CC   Culture NO GROWTH 5 DAYS  Final   Report Status 12/09/2014 FINAL  Final  Urine culture     Status: None   Collection Time: 12/05/14 12:46 AM  Result Value Ref Range Status   Specimen Description URINE, RANDOM  Final   Special Requests NONE  Final   Culture 3,000 COLONIES/mL INSIGNIFICANT GROWTH  Final   Report Status 12/06/2014 FINAL  Final    RADIOLOGY:  No results found.    Follow up with PCP in 1 week.  Management plans discussed with the patient, family and they are in  agreement.  CODE STATUS:   TOTAL TIME TAKING CARE OF THIS PATIENT ON DAY OF DISCHARGE: more than 30 minutes.    Hillary Bow R M.D on 12/09/2014 at 1:11 PM  Between 7am to 6pm - Pager - [phone]  After 6pm go to [email or web address] - password [email or web address] [facility]  [facility]  Office  [phone]  CC: Primary care physician; No PCP Per Patient

## 2014-12-10 ENCOUNTER — Observation Stay
Admission: EM | Admit: 2014-12-10 | Discharge: 2014-12-11 | Disposition: A | Discharge status: Home / Self Care | Payer: Self-pay | Attending: Internal Medicine | Admitting: Internal Medicine

## 2014-12-10 ENCOUNTER — Encounter: Payer: Self-pay | Admitting: Emergency Medicine

## 2014-12-10 ENCOUNTER — Emergency Department: Payer: Self-pay

## 2014-12-10 DIAGNOSIS — E876 Hypokalemia: Secondary | ICD-10-CM | POA: Insufficient documentation

## 2014-12-10 DIAGNOSIS — Z87891 Personal history of nicotine dependence: Secondary | ICD-10-CM | POA: Insufficient documentation

## 2014-12-10 DIAGNOSIS — Z885 Allergy status to narcotic agent status: Secondary | ICD-10-CM | POA: Insufficient documentation

## 2014-12-10 DIAGNOSIS — Z79891 Long term (current) use of opiate analgesic: Secondary | ICD-10-CM | POA: Insufficient documentation

## 2014-12-10 DIAGNOSIS — K3184 Gastroparesis: Secondary | ICD-10-CM | POA: Insufficient documentation

## 2014-12-10 DIAGNOSIS — R112 Nausea with vomiting, unspecified: Principal | ICD-10-CM | POA: Insufficient documentation

## 2014-12-10 DIAGNOSIS — Z881 Allergy status to other antibiotic agents status: Secondary | ICD-10-CM | POA: Insufficient documentation

## 2014-12-10 DIAGNOSIS — E1143 Type 2 diabetes mellitus with diabetic autonomic (poly)neuropathy: Secondary | ICD-10-CM | POA: Insufficient documentation

## 2014-12-10 DIAGNOSIS — Z888 Allergy status to other drugs, medicaments and biological substances status: Secondary | ICD-10-CM | POA: Insufficient documentation

## 2014-12-10 DIAGNOSIS — E1165 Type 2 diabetes mellitus with hyperglycemia: Secondary | ICD-10-CM | POA: Insufficient documentation

## 2014-12-10 DIAGNOSIS — B9620 Unspecified Escherichia coli [E. coli] as the cause of diseases classified elsewhere: Secondary | ICD-10-CM | POA: Insufficient documentation

## 2014-12-10 DIAGNOSIS — Z794 Long term (current) use of insulin: Secondary | ICD-10-CM | POA: Insufficient documentation

## 2014-12-10 DIAGNOSIS — N390 Urinary tract infection, site not specified: Secondary | ICD-10-CM | POA: Insufficient documentation

## 2014-12-10 DIAGNOSIS — Z23 Encounter for immunization: Secondary | ICD-10-CM | POA: Insufficient documentation

## 2014-12-10 DIAGNOSIS — G40909 Epilepsy, unspecified, not intractable, without status epilepticus: Secondary | ICD-10-CM | POA: Insufficient documentation

## 2014-12-10 DIAGNOSIS — Z9049 Acquired absence of other specified parts of digestive tract: Secondary | ICD-10-CM | POA: Insufficient documentation

## 2014-12-10 DIAGNOSIS — F419 Anxiety disorder, unspecified: Secondary | ICD-10-CM | POA: Insufficient documentation

## 2014-12-10 DIAGNOSIS — Z886 Allergy status to analgesic agent status: Secondary | ICD-10-CM | POA: Insufficient documentation

## 2014-12-10 HISTORY — DX: Conversion disorder with seizures or convulsions: F44.5

## 2014-12-10 HISTORY — DX: Unspecified convulsions: R56.9

## 2014-12-10 LAB — URINE DRUG SCREEN, QUALITATIVE (ARMC ONLY)
Amphetamines, Ur Screen: NOT DETECTED
BARBITURATES, UR SCREEN: NOT DETECTED
Benzodiazepine, Ur Scrn: POSITIVE — AB
CANNABINOID 50 NG, UR ~~LOC~~: POSITIVE — AB
COCAINE METABOLITE, UR ~~LOC~~: NOT DETECTED
MDMA (ECSTASY) UR SCREEN: NOT DETECTED
Methadone Scn, Ur: NOT DETECTED
OPIATE, UR SCREEN: NOT DETECTED
Phencyclidine (PCP) Ur S: NOT DETECTED
TRICYCLIC, UR SCREEN: NOT DETECTED

## 2014-12-10 LAB — URINALYSIS COMPLETE WITH MICROSCOPIC (ARMC ONLY)
BACTERIA UA: NONE SEEN
BILIRUBIN URINE: NEGATIVE
NITRITE: NEGATIVE
Protein, ur: 30 mg/dL — AB
Specific Gravity, Urine: 1.013 (ref 1.005–1.030)
pH: 7 (ref 5.0–8.0)

## 2014-12-10 LAB — COMPREHENSIVE METABOLIC PANEL
ALK PHOS: 42 U/L (ref 38–126)
ALT: 12 U/L — AB (ref 14–54)
AST: 29 U/L (ref 15–41)
Albumin: 4.1 g/dL (ref 3.5–5.0)
Anion gap: 11 (ref 5–15)
BUN: 6 mg/dL (ref 6–20)
CALCIUM: 9.5 mg/dL (ref 8.9–10.3)
CHLORIDE: 105 mmol/L (ref 101–111)
CO2: 24 mmol/L (ref 22–32)
CREATININE: 0.5 mg/dL (ref 0.44–1.00)
GFR calc non Af Amer: >60 mL/min (ref >60)
GLUCOSE: 297 mg/dL — AB (ref 65–99)
Potassium: 3.7 mmol/L (ref 3.5–5.1)
SODIUM: 140 mmol/L (ref 135–145)
Total Bilirubin: 0.9 mg/dL (ref 0.3–1.2)
Total Protein: 7.6 g/dL (ref 6.5–8.1)

## 2014-12-10 LAB — CBC WITH DIFFERENTIAL/PLATELET
BASOS PCT: 1 %
Basophils Absolute: 0.1 10*3/uL (ref 0–0.1)
EOS PCT: 2 %
Eosinophils Absolute: 0.2 10*3/uL (ref 0–0.7)
HEMATOCRIT: 38.5 % (ref 35.0–47.0)
HEMOGLOBIN: 12.8 g/dL (ref 12.0–16.0)
LYMPHS PCT: 25 %
Lymphs Abs: 2.6 10*3/uL (ref 1.0–3.6)
MCH: 28.1 pg (ref 26.0–34.0)
MCHC: 33.3 g/dL (ref 32.0–36.0)
MCV: 84.5 fL (ref 80.0–100.0)
MONOS PCT: 6 %
Monocytes Absolute: 0.6 10*3/uL (ref 0.2–0.9)
NEUTROS ABS: 6.7 10*3/uL — AB (ref 1.4–6.5)
NEUTROS PCT: 66 %
Platelets: 278 10*3/uL (ref 150–440)
RBC: 4.55 MIL/uL (ref 3.80–5.20)
RDW: 13.2 % (ref 11.5–14.5)
WBC: 10.2 10*3/uL (ref 3.6–11.0)

## 2014-12-10 LAB — ACETAMINOPHEN LEVEL: Acetaminophen (Tylenol), Serum: <10 ug/mL — ABNORMAL LOW (ref 10–30)

## 2014-12-10 LAB — GLUCOSE, CAPILLARY
GLUCOSE-CAPILLARY: 275 mg/dL — AB (ref 65–99)
Glucose-Capillary: 111 mg/dL — ABNORMAL HIGH (ref 65–99)

## 2014-12-10 LAB — ETHANOL: Alcohol, Ethyl (B): <5 mg/dL (ref <5)

## 2014-12-10 LAB — SALICYLATE LEVEL: Salicylate Lvl: <4 mg/dL (ref 2.8–30.0)

## 2014-12-10 LAB — LIPASE, BLOOD: LIPASE: 32 U/L (ref 22–51)

## 2014-12-10 MED ORDER — CEPHALEXIN 250 MG PO CAPS
500.0000 mg | ORAL_CAPSULE | Freq: Three times a day (TID) | ORAL | Status: DC
  Administered 2014-12-10 – 2014-12-11 (×2): 500 mg via ORAL
  Filled 2014-12-10 (×2): qty 2

## 2014-12-10 MED ORDER — LEVETIRACETAM 750 MG PO TABS
750.0000 mg | ORAL_TABLET | Freq: Two times a day (BID) | ORAL | Status: DC
  Administered 2014-12-10 – 2014-12-11 (×2): 750 mg via ORAL
  Filled 2014-12-10 (×3): qty 1

## 2014-12-10 MED ORDER — SODIUM CHLORIDE 0.9 % IV BOLUS (SEPSIS)
1000.0000 mL | Freq: Once | INTRAVENOUS | Status: AC
  Administered 2014-12-10: 1000 mL via INTRAVENOUS

## 2014-12-10 MED ORDER — HALOPERIDOL LACTATE 5 MG/ML IJ SOLN
5.0000 mg | Freq: Once | INTRAMUSCULAR | Status: AC
Start: 2014-12-10 — End: 2014-12-10
  Administered 2014-12-10: 5 mg via INTRAVENOUS

## 2014-12-10 MED ORDER — SODIUM CHLORIDE 0.9 % IV SOLN
INTRAVENOUS | Status: DC
  Administered 2014-12-10 – 2014-12-11 (×2): via INTRAVENOUS

## 2014-12-10 MED ORDER — INSULIN ASPART 100 UNIT/ML ~~LOC~~ SOLN
0.0000 [IU] | SUBCUTANEOUS | Status: DC
  Administered 2014-12-10: 21:00:00 5 [IU] via SUBCUTANEOUS
  Administered 2014-12-11: 04:00:00 1 [IU] via SUBCUTANEOUS
  Administered 2014-12-11: 3 [IU] via SUBCUTANEOUS
  Filled 2014-12-10: qty 3
  Filled 2014-12-10: qty 5
  Filled 2014-12-10: qty 1

## 2014-12-10 MED ORDER — HEPARIN SODIUM (PORCINE) 5000 UNIT/ML IJ SOLN
5000.0000 [IU] | Freq: Three times a day (TID) | INTRAMUSCULAR | Status: DC
  Administered 2014-12-10 – 2014-12-11 (×2): 5000 [IU] via SUBCUTANEOUS
  Filled 2014-12-10 (×2): qty 1

## 2014-12-10 MED ORDER — HALOPERIDOL LACTATE 5 MG/ML IJ SOLN
INTRAMUSCULAR | Status: AC
  Administered 2014-12-10: 5 mg via INTRAVENOUS
  Filled 2014-12-10: qty 1

## 2014-12-10 MED ORDER — SODIUM CHLORIDE 0.9 % IV SOLN
500.0000 mg | Freq: Once | INTRAVENOUS | Status: AC
  Administered 2014-12-10: 500 mg via INTRAVENOUS
  Filled 2014-12-10: qty 5

## 2014-12-10 MED ORDER — BENZOCAINE 20 % MT SOLN
OROMUCOSAL | Status: AC
  Filled 2014-12-10: qty 5

## 2014-12-10 MED ORDER — OXYCODONE HCL 5 MG PO TABS: 5.0000 mg | ORAL_TABLET | ORAL | Status: DC | PRN

## 2014-12-10 MED ORDER — LORAZEPAM 2 MG/ML IJ SOLN
1.0000 mg | Freq: Once | INTRAMUSCULAR | Status: AC
  Administered 2014-12-10: 1 mg via INTRAVENOUS
  Filled 2014-12-10: qty 1

## 2014-12-10 MED ORDER — INFLUENZA VAC SPLIT QUAD 0.5 ML IM SUSY
0.5000 mL | PREFILLED_SYRINGE | INTRAMUSCULAR | Status: AC
  Administered 2014-12-11: 0.5 mL via INTRAMUSCULAR
  Filled 2014-12-10: qty 0.5

## 2014-12-10 MED ORDER — ONDANSETRON HCL 4 MG PO TABS
4.0000 mg | ORAL_TABLET | Freq: Four times a day (QID) | ORAL | Status: DC | PRN
  Administered 2014-12-11: 4 mg via ORAL
  Filled 2014-12-10: qty 1

## 2014-12-10 MED ORDER — ACETAMINOPHEN 650 MG RE SUPP: 650.0000 mg | Freq: Four times a day (QID) | RECTAL | Status: DC | PRN

## 2014-12-10 MED ORDER — DOCUSATE SODIUM 100 MG PO CAPS
100.0000 mg | ORAL_CAPSULE | Freq: Two times a day (BID) | ORAL | Status: DC
  Administered 2014-12-10 – 2014-12-11 (×2): 100 mg via ORAL
  Filled 2014-12-10 (×3): qty 1

## 2014-12-10 MED ORDER — ACETAMINOPHEN 325 MG PO TABS: 650.0000 mg | ORAL_TABLET | Freq: Four times a day (QID) | ORAL | Status: DC | PRN

## 2014-12-10 MED ORDER — ONDANSETRON HCL 4 MG/2ML IJ SOLN
4.0000 mg | Freq: Once | INTRAMUSCULAR | Status: AC
  Administered 2014-12-10: 4 mg via INTRAVENOUS
  Filled 2014-12-10: qty 2

## 2014-12-10 MED ORDER — ONDANSETRON HCL 4 MG/2ML IJ SOLN: 4.0000 mg | Freq: Four times a day (QID) | INTRAMUSCULAR | Status: DC | PRN

## 2014-12-10 MED ORDER — BISACODYL 10 MG RE SUPP: 10.0000 mg | Freq: Every day | RECTAL | Status: DC | PRN

## 2014-12-10 NOTE — H&P (Signed)
History and Physical    Rachael Gray DOB: 1980-06-26 DOA: 12/10/2014  Referring physician: Dr. Burlene Arnt PCP: No PCP Per Patient  Specialists: none  Chief Complaint: N/V  HPI: Rachael Gray is a 34 y.o. female has a past medical history significant for DM, seizures and anxiety recently hospitalized for pyelonephritis now with intractable N/V unable to keep po meds down. Continues to vomit in ER despite IV Haldol and IV Zofran. Allergic to Reglan. Has known diabetic gastroparesis.  Review of Systems: The patient denies fever, weight loss,, vision loss, decreased hearing, hoarseness, chest pain, syncope, dyspnea on exertion, peripheral edema, balance deficits, hemoptysis,  melena, hematochezia, severe indigestion/heartburn, hematuria, incontinence, genital sores, muscle weakness, suspicious skin lesions, transient blindness, difficulty walking, depression, unusual weight change, abnormal bleeding, enlarged lymph nodes, angioedema, and breast masses.   Past Medical History  Diagnosis Date  . Diabetes mellitus without complication   . Seizures   . Pseudoseizures    Past Surgical History  Procedure Laterality Date  . Appendectomy    . Cholecystectomy     Social History:  reports that she has quit smoking. Her smoking use included Cigarettes. She smoked 2.00 packs per day. She does not have any smokeless tobacco history on file. She reports that she drinks alcohol. She reports that she does not use illicit drugs.  Allergies  Allergen Reactions  . Doxycycline Swelling  . Morphine And Related   . Reglan [Metoclopramide]   . Tylenol [Acetaminophen] Rash    History reviewed. No pertinent family history.  Prior to Admission medications   Medication Sig Start Date End Date Taking? Authorizing Provider  cephALEXin (KEFLEX) 500 MG capsule Take 1 capsule (500 mg total) by mouth 3 (three) times daily. 12/07/14 12/17/14 Yes Srikar Sudini, MD  insulin NPH-regular Human  (NOVOLIN 70/30) (70-30) 100 UNIT/ML injection Inject 22-25 Units into the skin 2 (two) times daily with a meal. 25 units in the morning and 22 units in the evening   Yes Historical Provider, MD  levETIRAcetam (KEPPRA) 750 MG tablet Take 1 tablet (750 mg total) by mouth 2 (two) times daily. 11/12/14  Yes Ahmed Prima, MD  oxyCODONE (OXY IR/ROXICODONE) 5 MG immediate release tablet Take 1 tablet (5 mg total) by mouth every 4 (four) hours as needed for moderate pain or severe pain. 11/28/14  Yes Orbie Pyo, MD   Physical Exam: Filed Vitals:   12/10/14 1301 12/10/14 1344 12/10/14 1433  BP: 134/79 138/84 118/59  Pulse: 85 75 74  Temp: 97.6 F (36.4 C)    TempSrc: Oral    Resp: 32 20 19  Height: 5\' 5"  (1.651 m)    Weight: 61.7 kg (136 lb 0.4 oz)    SpO2: 98% 96% 96%     General:  No apparent distress  Eyes: PERRL, EOMI, no scleral icterus  ENT: moist oropharynx  Neck: supple, no lymphadenopathy  Cardiovascular: regular rate without MRG; 2+ peripheral pulses, no JVD, no peripheral edema  Respiratory: CTA biL, good air movement without wheezing, rhonchi or crackled  Abdomen: soft,  tender to palpation, positive bowel sounds, no guarding, no rebound  Skin: no rashes  Musculoskeletal: normal bulk and tone, no joint swelling  Psychiatric: normal mood and affect  Neurologic: CN 2-12 grossly intact, MS 5/5 in all 4  Labs on Admission:  Basic Metabolic Panel:  Recent Labs Lab 12/04/14 1739 12/04/14 2126 12/05/14 0447 12/10/14 1254  NA 136  --  141 140  K 3.7  --  3.9 3.7  CL 100*  --  105 105  CO2 22  --  21* 24  GLUCOSE 299*  --  257* 297*  BUN 10  --  14 6  CREATININE 0.63  --  0.53 0.50  CALCIUM 9.9  --  9.5 9.5  MG  --  1.4*  --   --    Liver Function Tests:  Recent Labs Lab 12/04/14 1739 12/10/14 1254  AST 26 29  ALT 12* 12*  ALKPHOS 50 42  BILITOT 0.8 0.9  PROT 9.0* 7.6  ALBUMIN 4.5 4.1    Recent Labs Lab 12/04/14 1739 12/10/14 1254   LIPASE 64* 32   No results for input(s): AMMONIA in the last 168 hours. CBC:  Recent Labs Lab 12/04/14 1739 12/05/14 0546 12/10/14 1254  WBC 10.3 13.3* 10.2  NEUTROABS  --   --  6.7*  HGB 14.5 12.8 12.8  HCT 43.4 39.8 38.5  MCV 84.9 85.8 84.5  PLT 269 258 278   Cardiac Enzymes: No results for input(s): CKTOTAL, CKMB, CKMBINDEX, TROPONINI in the last 168 hours.  BNP (last 3 results) No results for input(s): BNP in the last 8760 hours.  ProBNP (last 3 results) No results for input(s): PROBNP in the last 8760 hours.  CBG:  Recent Labs Lab 12/06/14 1105 12/06/14 1629 12/06/14 2131 12/07/14 0758 12/07/14 1118  GLUCAP 144* 180* 167* 159* 97    Radiological Exams on Admission: No results found.  EKG: Independently reviewed.  Assessment/Plan Active Problems:   Intractable nausea and vomiting   Will observe on the floor with IV fluids. Check KUB. Consult GI. Drop NG tube.Follow sugars.  Diet: NPO Fluids: NS@100  DVT Prophylaxis: SQ Heparin  Code Status: FULL  Family Communication: none  Disposition Plan: home  Time spent: 45 min

## 2014-12-10 NOTE — ED Notes (Signed)
Vomited large amount of yellow emesis.  Linens changed, skin care given.

## 2014-12-10 NOTE — ED Provider Notes (Addendum)
[facility] Emergency Department [place] Note  ____________________________________________  Time seen: Approximately 1:29 PM  I have reviewed the triage vital signs and the nursing notes.   HISTORY  Chief Complaint Seizures    HPI Brandey L Heese is a 34 y.o. female who has a history of diabetes, narcotic seeking behavior, pseudoseizures, apparently also true seizures,medical noncompliance, and recent discharge from this facility after pyelonephritis presents today complaining of having had a seizure. She is in the process of having what is obviously a pseudoseizure. EMS did say that she vomited. The patient had several seizure-like activities with EMS and did receive some sedating medication but she is still yelling. Patient is demanding Dilaudid by name. She states that she does not believe she took her Keppra this morning but she did finish her antibiotics. Patient is not pregnant by her own account and by recent negative pregnancy test. She states that her whole body hurts.  Past Medical History  Diagnosis Date  . Diabetes mellitus without complication   . Seizures   . Pseudoseizures     Patient Active Problem List   Diagnosis Date Noted  . Narcotic abuse 12/09/2014  . Sepsis 12/07/2014  . Anxiety   . Pyelonephritis 12/04/2014  . Lactic acidosis 12/04/2014    Class: Acute    Past Surgical History  Procedure Laterality Date  . Appendectomy    . Cholecystectomy      Current Outpatient Rx  Name  Route  Sig  Dispense  Refill  . cephALEXin (KEFLEX) 500 MG capsule   Oral   Take 1 capsule (500 mg total) by mouth 3 (three) times daily.   7 capsule   0   . insulin NPH-regular Human (NOVOLIN 70/30) (70-30) 100 UNIT/ML injection   Subcutaneous   Inject 22-25 Units into the skin 2 (two) times daily with a meal. 25 units in the morning and 22 units in the evening         . levETIRAcetam (KEPPRA) 750 MG tablet   Oral   Take 1 tablet (750 mg  total) by mouth 2 (two) times daily.   60 tablet   0   . oxyCODONE (OXY IR/ROXICODONE) 5 MG immediate release tablet   Oral   Take 1 tablet (5 mg total) by mouth every 4 (four) hours as needed for moderate pain or severe pain.   15 tablet   0     Allergies Doxycycline; Morphine and related; Reglan; and Tylenol  No family history on file.  Social History Social History  Substance Use Topics  . Smoking status: Former Smoker -- 2.00 packs/day    Types: Cigarettes  . Smokeless tobacco: None  . Alcohol Use: Yes    Review of Systems Limited by patient interaction with exam. Constitutional: No fever/chills Eyes: No visual changes. ENT: No sore throat. Cardiovascular: Denies chest pain. Respiratory: Denies shortness of breath. Gastrointestinal: Sensation is been vomiting and has abdominal pain for "weeks" Genitourinary: Patient did not answer Musculoskeletal: Patient states her entire back hurts Skin: Negative for rash. Neurological: Negative for headaches, focal weakness or numbness. Psychiatric: Patient denies being suicidal  10-point ROS otherwise negative.  ____________________________________________   PHYSICAL EXAM:  VITAL SIGNS: ED Triage Vitals  Enc Vitals Group     BP 12/10/14 1301 134/79 mmHg     Pulse Rate 12/10/14 1301 85     Resp 12/10/14 1301 32     Temp 12/10/14 1301 97.6 F (36.4 C)     Temp Source 12/10/14  1301 Oral     SpO2 12/10/14 1301 98 %     Weight 12/10/14 1301 136 lb 0.4 oz (61.7 kg)     Height 12/10/14 1301 5\' 5"  (1.651 m)     Head Cir --      Peak Flow --      Pain Score 12/10/14 1302 10     Pain Loc --      Pain Edu? --      Excl. in [place]? --     Constitutional: Alert and oriented. Physically, the patient is nontoxic but she is yelling, hyperventilating and having nonphysiologic shaking activity episodes which seem to intensify when she is upset. Able speak clearly despite this activity. Eyes: Conjunctivae are normal. PERRL.  EOMI. Head: Atraumatic. Nose: No congestion/rhinnorhea. Mouth/Throat: Mucous membranes are moist.  Oropharynx non-erythematous. Neck: No stridor.   Cardiovascular: Normal rate, regular rhythm. Grossly normal heart sounds.  Good peripheral circulation. Respiratory: Normal respiratory effort.  No retractions. Lungs CTAB. She hyperventilating and upset Gastrointestinal: She is distracted there is no evidence of abdominal tenderness No distention. No abdominal bruits. No CVA tenderness. Musculoskeletal: No lower extremity tenderness nor edema.  No joint effusions. Neurologic:  Normal speech and language. No gross focal neurologic deficits are appreciated. No gait instability. Skin:  Skin is warm, dry and intact. No rash noted. Psychiatric: Mood and affect are anxious upset. Speech and behavior are anxious and upset  ____________________________________________   LABS (all labs ordered are listed, but only abnormal results are displayed)  Labs Reviewed  COMPREHENSIVE METABOLIC PANEL - Abnormal; Notable for the following:    Glucose, Bld 297 (*)    ALT 12 (*)    All other components within normal limits  URINE CULTURE  LIPASE, BLOOD  CBC WITH DIFFERENTIAL/PLATELET  URINALYSIS COMPLETEWITH MICROSCOPIC ([facility] ONLY)  ACETAMINOPHEN LEVEL  ETHANOL  SALICYLATE LEVEL  POC URINE PREG, ED   ____________________________________________  EKG  I personally reviewed the EKG on this patient, rate 87 bpm no acute ST elevation or ST depression normal axis ____________________________________________  RADIOLOGY  ____________________________________________   PROCEDURES  Procedure(s) performed: None  Critical Care performed: None  ____________________________________________   INITIAL IMPRESSION / ASSESSMENT AND PLAN / ED COURSE  Pertinent labs & imaging results that were available during my care of the patient were reviewed by me and considered in my medical decision making (see chart  for details).  Patient here complaining of what she herself describes as pseudoseizures. However, she also takes Keppra for evidently true seizures and she has a history of noncompliance. I will give her an IV load of Keppra as she does have a history of noncompliance. The patient is yelling and screaming in the room and demanding Dilaudid pain medication. I certainly do not think narcotic pain medications are a viable therapeutic intervention at this point. I did give the patient Haldol as an antiemetic as she was having some rather loud forced vomiting in the room. She does not have a history of long QTC on prior EKGs. Patient has since calmed down and is resting comfortably in the bed. This very difficult to understand what here is secondary to physical and what is secondary to mental distress. We will check diffuse blood work and we will monitor her closely. Patient is much more comfortable at this time a longer yelling and screaming no longer having obvious pseudoseizure activity. We will keep her on a monitor and watch her closely here while we try to determine what if any interventions  or further required. ____________________________________________  ----------------------------------------- 3:46 PM on 12/10/2014 -----------------------------------------  Patient awakes after her slumber feeling much refreshed and feeling better. She would like to try some by mouth challenge. No actual seizure activity since arrival. [place] work reassuring, vital signs are reassuring. His minute the patient could tolerate by mouth and perhaps returning home. We will reassess. Patient does have a history of gastroparesis and chronic vomiting however.  ----------------------------------------- 3:53 PM on 12/10/2014 -----------------------------------------  did try by mouth, patient vomited again. She states she is not really a home because she still feels too nauseated. Her abdomen is benign. We'll try other  anti-emetics and I talked to the hospitalist service. I feel he may not be successful in curing or controlling this patient's pathology today in the emergency department FINAL CLINICAL IMPRESSION(S) / ED DIAGNOSES  Final diagnoses:  None     Schuyler Amor, MD 12/10/14 [place], MD 12/10/14 [place], MD 12/10/14 1553

## 2014-12-10 NOTE — ED Notes (Signed)
Patient AAOx3.  Full body shaking observed on arrival.  Patient states she has history of pseudoseizures and seizures.  Patient upset and hyperventilating.  Moderate effect to relaxation techniques, shaking decreased during breathing exercises.  Patient reportedly had recent miscarriage.  Skin warm and dry.

## 2014-12-10 NOTE — Plan of Care (Signed)
Problem: Discharge Progression Outcomes Goal: Other Discharge Outcomes/Goals Outcome: Progressing Plan of care progress: -NPO except ice and meds -IV fluids NS at 125ml/hr -PRN meds per order for nausea and vomiting -no complaints of pain, no distress or discomfort noted -no noted seizure activity, sleeping between care, not interacting with staff

## 2014-12-10 NOTE — ED Notes (Signed)
EMS called to residence secondary to seizures.  On EMS arrival patient awake and alert, full body shaking noted.  EMS started 20 ga IV to right hand and valium given enroute.  Patient vomited large amount en route to [facility].

## 2014-12-10 NOTE — ED Notes (Signed)
Patient removed NGT and refuses NGT to be replaced.  Dr. Doy Hutching aware and no further action ordered.

## 2014-12-10 NOTE — ED Notes (Signed)
Incontinent of urine.  Also emesis on bed linens.  Linens changed, skin care given.

## 2014-12-10 NOTE — ED Notes (Signed)
Moving all extremities equally and strong.  Patient calm and resting after Haldol given.  Respirations regular and non labored.  NAD>  Continue to monitor.

## 2014-12-10 NOTE — ED Notes (Signed)
Patient sleeping. Skin warm and dry.  Respirations regular and non labored.  Continue to monitor.

## 2014-12-10 NOTE — ED Notes (Signed)
Remains AAOx3.  Skin warm and dry.  Moving all extremities equally and strong.  Has episodes of pseudoseizure that resolve with emotional support and relaxation breathing techniques.  Patient resting now.  No seizure activity observed.

## 2014-12-11 LAB — CBC
HEMATOCRIT: 32 % — AB (ref 35.0–47.0)
HEMOGLOBIN: 10.6 g/dL — AB (ref 12.0–16.0)
MCH: 28.9 pg (ref 26.0–34.0)
MCHC: 33.2 g/dL (ref 32.0–36.0)
MCV: 87.1 fL (ref 80.0–100.0)
Platelets: 219 10*3/uL (ref 150–440)
RBC: 3.68 MIL/uL — AB (ref 3.80–5.20)
RDW: 13.7 % (ref 11.5–14.5)
WBC: 9.7 10*3/uL (ref 3.6–11.0)

## 2014-12-11 LAB — COMPREHENSIVE METABOLIC PANEL
ALBUMIN: 3.2 g/dL — AB (ref 3.5–5.0)
ALT: 10 U/L — ABNORMAL LOW (ref 14–54)
ANION GAP: 10 (ref 5–15)
AST: 45 U/L — ABNORMAL HIGH (ref 15–41)
Alkaline Phosphatase: 33 U/L — ABNORMAL LOW (ref 38–126)
BILIRUBIN TOTAL: 0.7 mg/dL (ref 0.3–1.2)
BUN: <5 mg/dL — ABNORMAL LOW (ref 6–20)
CO2: 25 mmol/L (ref 22–32)
Calcium: 8.3 mg/dL — ABNORMAL LOW (ref 8.9–10.3)
Chloride: 105 mmol/L (ref 101–111)
Creatinine, Ser: 0.61 mg/dL (ref 0.44–1.00)
Glucose, Bld: 235 mg/dL — ABNORMAL HIGH (ref 65–99)
POTASSIUM: 2.8 mmol/L — AB (ref 3.5–5.1)
Sodium: 140 mmol/L (ref 135–145)
TOTAL PROTEIN: 6 g/dL — AB (ref 6.5–8.1)

## 2014-12-11 LAB — POCT PREGNANCY, URINE: Preg Test, Ur: NEGATIVE

## 2014-12-11 LAB — GLUCOSE, CAPILLARY
GLUCOSE-CAPILLARY: 127 mg/dL — AB (ref 65–99)
Glucose-Capillary: 125 mg/dL — ABNORMAL HIGH (ref 65–99)
Glucose-Capillary: 222 mg/dL — ABNORMAL HIGH (ref 65–99)

## 2014-12-11 LAB — MAGNESIUM: Magnesium: 1.4 mg/dL — ABNORMAL LOW (ref 1.7–2.4)

## 2014-12-11 MED ORDER — INSULIN ASPART PROT & ASPART (70-30 MIX) 100 UNIT/ML ~~LOC~~ SUSP
17.0000 [IU] | Freq: Two times a day (BID) | SUBCUTANEOUS | Status: DC
  Administered 2014-12-11: 08:00:00 17 [IU] via SUBCUTANEOUS
  Filled 2014-12-11: qty 17

## 2014-12-11 MED ORDER — POTASSIUM CHLORIDE 20 MEQ/15ML (10%) PO SOLN
60.0000 meq | Freq: Once | ORAL | Status: AC
  Administered 2014-12-11: 11:00:00 60 meq via ORAL
  Filled 2014-12-11: qty 45

## 2014-12-11 MED ORDER — MAGNESIUM OXIDE 400 (241.3 MG) MG PO TABS
400.0000 mg | ORAL_TABLET | Freq: Once | ORAL | Status: AC
  Administered 2014-12-11: 12:00:00 400 mg via ORAL
  Filled 2014-12-11: qty 1

## 2014-12-11 MED ORDER — MAGNESIUM SULFATE 2 GM/50ML IV SOLN
2.0000 g | Freq: Once | INTRAVENOUS | Status: DC
  Filled 2014-12-11: qty 50

## 2014-12-11 MED ORDER — POTASSIUM CHLORIDE CRYS ER 20 MEQ PO TBCR: 40.0000 meq | EXTENDED_RELEASE_TABLET | ORAL | Status: DC

## 2014-12-11 MED ORDER — CEPHALEXIN 500 MG PO CAPS: 500.0000 mg | ORAL_CAPSULE | Freq: Three times a day (TID) | ORAL | Status: DC

## 2014-12-11 MED ORDER — POTASSIUM CHLORIDE CRYS ER 20 MEQ PO TBCR
40.0000 meq | EXTENDED_RELEASE_TABLET | Freq: Once | ORAL | Status: AC
  Administered 2014-12-11: 11:00:00 40 meq via ORAL
  Filled 2014-12-11: qty 2

## 2014-12-11 NOTE — Progress Notes (Signed)
MD order to discharge pt home.  Pt started vomiting.  MD aware.  Pt may still discharge if feeling OK after lunch.    Pt ride came and pt wanted to be discharged before eating lunch, her ride had somewhere to be.  Pt discharged.

## 2014-12-11 NOTE — Plan of Care (Signed)
Problem: Discharge Progression Outcomes Goal: Other Discharge Outcomes/Goals Outcome: Progressing Plan of care progress to goals: Discharge plan- pt is from home and plans to return home. Pain controlled- no c/o pain or discomfort noted this shift. Hemodynamically stable- VSS, IV fluids NS at 177ml/hr per order. Complications resolved- no noted seizure activity, sleeping between care, withdrawn.  Tolerating diet- NPO except ice and meds, no c/o nausea or vomiting. Activity appropriate for discharge- moderate fall risk, standby assist while up to BR.  A&O pt, no c/o pain, nausea or vomiting this shift. IVF infusing per order. No seizure activity noted this shift. VSS. Moderate fall risk.

## 2014-12-11 NOTE — Progress Notes (Signed)
Critical potassium level of 2.8 received this morning during rounding report. Day RN notified of critical value and will report to rounding MD.

## 2014-12-11 NOTE — Care Management Note (Signed)
Case Management Note  Patient Details  Name: CIMONE FAHEY MRN: [id number] Date of Birth: 1980/12/31  Subjective/Objective:   Uninsured Ms Prell was provided with an application to the [facility], a generic multiple use generic drug discount card, and a list of medical clinics who serve uninsured patients. Ms Roundy verbalized understanding to call [facility] tomorrow and tell them that she was discharged from [facility] today and needs to be seen tomorrow.                  Action/Plan:   Expected Discharge Date:                  Expected Discharge Plan:     In-House Referral:     Discharge planning Services     Post Acute Care Choice:    Choice offered to:     DME Arranged:    DME Agency:     HH Arranged:    [place] Agency:     Status of Service:     Medicare Important Message Given:    Date Medicare IM Given:    Medicare IM give by:    Date Additional Medicare IM Given:    Additional Medicare Important Message give by:     If discussed at [place] of Stay Meetings, dates discussed:    Additional Comments:  Maverick Patman A, RN 12/11/2014, 8:52 AM

## 2014-12-11 NOTE — Discharge Summary (Signed)
[facility] NAME: Rachael Gray    MR#:  [id number]  DATE OF BIRTH:  1981-01-30  DATE OF ADMISSION:  12/10/2014 ADMITTING PHYSICIAN: Idelle Crouch, MD  DATE OF DISCHARGE: 12/11/2014 PRIMARY CARE PHYSICIAN: No PCP Per Patient    ADMISSION DIAGNOSIS:  Nausea & vomiting [R11.2]  DISCHARGE DIAGNOSIS:  Active Problems:   Intractable nausea and vomiting   Nausea & vomiting   SECONDARY DIAGNOSIS:   Past Medical History  Diagnosis Date  . Diabetes mellitus without complication   . Seizures   . Pseudoseizures     HOSPITAL COURSE:  34 year old female with a history of uncontrolled diabetes, seizures and anxiety who presented with intractable nausea and vomiting. For further details please further H&P.  1. Intractable nausea and vomiting: This is likely secondary to gastroenteritis. Patient is actually doing well this morning. Patient was transitioned to a soft diet and tolerated this.  2. Hypokalemia: This is secondary to problem #1. Potassium was repleted prior to discharge. Magnesium was low and therefore was also supplemented.  3. Urinary tract infection: Patient was recently treated for Escherichia coli urinary tract infection on Keflex. She continues to have a mild urinary infection. She'll continue for a few more days and Keflex. Previous Urine culture was sensitive to this and about.  4. Uncontrolled diabetes: His last hemoglobin A1c was 10.2. I discussed the patient should have close follow-up for her diabetes. She'll be referred to open her clinic.  5. Seizure disorder: Patient will continue [place] AND DIET:  Patient is being discharged home in stable condition on a diabetic diet  CONSULTS OBTAINED:  Treatment Team:  Manya Silvas, MD  DRUG ALLERGIES:   Allergies  Allergen Reactions  . Doxycycline Swelling  . Morphine And Related   . Reglan [Metoclopramide]   . Tylenol  [Acetaminophen] Rash    DISCHARGE MEDICATIONS:   Current Discharge Medication List    CONTINUE these medications which have CHANGED   Details  !! cephALEXin (KEFLEX) 500 MG capsule Take 1 capsule (500 mg total) by mouth 3 (three) times daily. Qty: 12 capsule, Refills: 0     !! - Potential duplicate medications found. Please discuss with provider.    CONTINUE these medications which have NOT CHANGED   Details  !! cephALEXin (KEFLEX) 500 MG capsule Take 1 capsule (500 mg total) by mouth 3 (three) times daily. Qty: 7 capsule, Refills: 0    insulin NPH-regular Human (NOVOLIN 70/30) (70-30) 100 UNIT/ML injection Inject 22-25 Units into the skin 2 (two) times daily with a meal. 25 units in the morning and 22 units in the evening    levETIRAcetam (KEPPRA) 750 MG tablet Take 1 tablet (750 mg total) by mouth 2 (two) times daily. Qty: 60 tablet, Refills: 0    oxyCODONE (OXY IR/ROXICODONE) 5 MG immediate release tablet Take 1 tablet (5 mg total) by mouth every 4 (four) hours as needed for moderate pain or severe pain. Qty: 15 tablet, Refills: 0     !! - Potential duplicate medications found. Please discuss with provider.            Today   CHIEF COMPLAINT:  Patient is doing well this morning. Patient would like to go home. Patient denies nausea or vomiting.   VITAL SIGNS:  Blood pressure 118/60, pulse 71, temperature 97.8 F (36.6 C), temperature source Oral, resp. rate 18, height 5\' 5"  (1.651 m), weight 63.186 kg (139 lb 4.8  oz), last menstrual period 12/09/2014, SpO2 100 %.   REVIEW OF SYSTEMS:  Review of Systems  Constitutional: Negative for fever, chills and malaise/fatigue.  HENT: Negative for sore throat.   Eyes: Negative for blurred vision.  Respiratory: Negative for cough, hemoptysis, shortness of breath and wheezing.   Cardiovascular: Negative for chest pain, palpitations and leg swelling.  Gastrointestinal: Negative for nausea, vomiting, abdominal pain, diarrhea  and blood in stool.  Genitourinary: Negative for dysuria.  Musculoskeletal: Negative for back pain.  Neurological: Negative for dizziness, tremors and headaches.  Endo/Heme/Allergies: Does not bruise/bleed easily.     PHYSICAL EXAMINATION:  GENERAL:  34 y.o.-year-old patient lying in the bed with no acute distress.  NECK:  Supple, no jugular venous distention. No thyroid enlargement, no tenderness.  LUNGS: Normal breath sounds bilaterally, no wheezing, rales,rhonchi  No use of accessory muscles of respiration.  CARDIOVASCULAR: S1, S2 normal. No murmurs, rubs, or gallops.  ABDOMEN: Soft, non-tender, non-distended. Bowel sounds present. No organomegaly or mass.  EXTREMITIES: No pedal edema, cyanosis, or clubbing.  PSYCHIATRIC: The patient is alert and oriented x 3.  SKIN: No obvious rash, lesion, or ulcer.   DATA REVIEW:   CBC  Recent Labs Lab 12/11/14 0503  WBC 9.7  HGB 10.6*  HCT 32.0*  PLT 219    Chemistries   Recent Labs Lab 12/11/14 0503  NA 140  K 2.8*  CL 105  CO2 25  GLUCOSE 235*  BUN <5*  CREATININE 0.61  CALCIUM 8.3*  MG 1.4*  AST 45*  ALT 10*  ALKPHOS 33*  BILITOT 0.7    Cardiac Enzymes No results for input(s): TROPONINI in the last 168 hours.  Microbiology Results  @MICRORSLT48 @  RADIOLOGY:  Dg Abd 1 View  12/10/2014   CLINICAL DATA:  Nausea, vomiting and seizures.  EXAM: ABDOMEN - 1 VIEW  COMPARISON:  CT scan 11/28/2014  FINDINGS: The bowel gas pattern is unremarkable. No findings for obstruction or perforation. The soft tissue shadows are maintained. No worrisome calcifications. The bony structures are unremarkable.  IMPRESSION: No plain film findings for an acute abdominal process.   Electronically Signed   By: Marijo Sanes M.D.   On: 12/10/2014 17:31      Management plans discussed with the patient and   she is in agreement. Stable for discharge home  Patient should follow up with PCP  CODE STATUS:     Code Status Orders         Start     Ordered   12/10/14 1802  Full code   Continuous     12/10/14 1801      TOTAL TIME TAKING CARE OF THIS PATIENT: 35 minutes.    MODY, SITAL M.D on 12/11/2014 at 10:22 AM  Between 7am to 6pm - Pager - [phone] After 6pm go to [email or web address] - password [email or web address] [facility]  [facility]  Office  [phone]  CC: Primary care physician; No PCP Per Patient

## 2014-12-12 LAB — URINE CULTURE: Culture: NO GROWTH

## 2014-12-13 ENCOUNTER — Emergency Department
Admission: EM | Admit: 2014-12-13 | Discharge: 2014-12-13 | Discharge status: Against Medical Advice | Payer: Self-pay | Attending: Emergency Medicine | Admitting: Emergency Medicine

## 2014-12-13 DIAGNOSIS — Z792 Long term (current) use of antibiotics: Secondary | ICD-10-CM | POA: Insufficient documentation

## 2014-12-13 DIAGNOSIS — R112 Nausea with vomiting, unspecified: Secondary | ICD-10-CM | POA: Insufficient documentation

## 2014-12-13 DIAGNOSIS — F1110 Opioid abuse, uncomplicated: Secondary | ICD-10-CM | POA: Insufficient documentation

## 2014-12-13 DIAGNOSIS — F1310 Sedative, hypnotic or anxiolytic abuse, uncomplicated: Secondary | ICD-10-CM | POA: Insufficient documentation

## 2014-12-13 DIAGNOSIS — R109 Unspecified abdominal pain: Secondary | ICD-10-CM | POA: Insufficient documentation

## 2014-12-13 DIAGNOSIS — Z79899 Other long term (current) drug therapy: Secondary | ICD-10-CM | POA: Insufficient documentation

## 2014-12-13 DIAGNOSIS — F1210 Cannabis abuse, uncomplicated: Secondary | ICD-10-CM | POA: Insufficient documentation

## 2014-12-13 DIAGNOSIS — R569 Unspecified convulsions: Secondary | ICD-10-CM

## 2014-12-13 DIAGNOSIS — E119 Type 2 diabetes mellitus without complications: Secondary | ICD-10-CM | POA: Insufficient documentation

## 2014-12-13 DIAGNOSIS — Z87891 Personal history of nicotine dependence: Secondary | ICD-10-CM | POA: Insufficient documentation

## 2014-12-13 DIAGNOSIS — G40909 Epilepsy, unspecified, not intractable, without status epilepticus: Secondary | ICD-10-CM | POA: Insufficient documentation

## 2014-12-13 DIAGNOSIS — F445 Conversion disorder with seizures or convulsions: Secondary | ICD-10-CM

## 2014-12-13 DIAGNOSIS — Z794 Long term (current) use of insulin: Secondary | ICD-10-CM | POA: Insufficient documentation

## 2014-12-13 DIAGNOSIS — Z765 Malingerer [conscious simulation]: Secondary | ICD-10-CM | POA: Insufficient documentation

## 2014-12-13 DIAGNOSIS — Z3202 Encounter for pregnancy test, result negative: Secondary | ICD-10-CM | POA: Insufficient documentation

## 2014-12-13 LAB — URINALYSIS COMPLETE WITH MICROSCOPIC (ARMC ONLY)
BILIRUBIN URINE: NEGATIVE
Bacteria, UA: NONE SEEN
Hgb urine dipstick: NEGATIVE
KETONES UR: NEGATIVE mg/dL
NITRITE: NEGATIVE
PROTEIN: NEGATIVE mg/dL
Specific Gravity, Urine: 1.009 (ref 1.005–1.030)
pH: 7 (ref 5.0–8.0)

## 2014-12-13 LAB — URINE DRUG SCREEN, QUALITATIVE (ARMC ONLY)
Amphetamines, Ur Screen: NOT DETECTED
BARBITURATES, UR SCREEN: NOT DETECTED
BENZODIAZEPINE, UR SCRN: POSITIVE — AB
CANNABINOID 50 NG, UR ~~LOC~~: POSITIVE — AB
Cocaine Metabolite,Ur ~~LOC~~: NOT DETECTED
MDMA (Ecstasy)Ur Screen: NOT DETECTED
Methadone Scn, Ur: NOT DETECTED
Opiate, Ur Screen: NOT DETECTED
PHENCYCLIDINE (PCP) UR S: NOT DETECTED
TRICYCLIC, UR SCREEN: NOT DETECTED

## 2014-12-13 LAB — COMPREHENSIVE METABOLIC PANEL
ALBUMIN: 3.9 g/dL (ref 3.5–5.0)
ALT: 10 U/L — ABNORMAL LOW (ref 14–54)
ANION GAP: 12 (ref 5–15)
AST: 15 U/L (ref 15–41)
Alkaline Phosphatase: 39 U/L (ref 38–126)
BILIRUBIN TOTAL: 0.1 mg/dL — AB (ref 0.3–1.2)
BUN: 6 mg/dL (ref 6–20)
CALCIUM: 9.5 mg/dL (ref 8.9–10.3)
CO2: 26 mmol/L (ref 22–32)
Chloride: 103 mmol/L (ref 101–111)
Creatinine, Ser: 0.47 mg/dL (ref 0.44–1.00)
GFR calc non Af Amer: >60 mL/min (ref >60)
GLUCOSE: 233 mg/dL — AB (ref 65–99)
POTASSIUM: 3.6 mmol/L (ref 3.5–5.1)
SODIUM: 141 mmol/L (ref 135–145)
TOTAL PROTEIN: 7.3 g/dL (ref 6.5–8.1)

## 2014-12-13 LAB — ETHANOL

## 2014-12-13 LAB — SALICYLATE LEVEL

## 2014-12-13 LAB — LIPASE, BLOOD: Lipase: 32 U/L (ref 22–51)

## 2014-12-13 LAB — HCG, QUANTITATIVE, PREGNANCY: HCG, BETA CHAIN, QUANT, S: 1 m[IU]/mL (ref <5)

## 2014-12-13 LAB — LACTIC ACID, PLASMA: LACTIC ACID, VENOUS: 1.3 mmol/L (ref 0.5–2.0)

## 2014-12-13 LAB — ACETAMINOPHEN LEVEL: Acetaminophen (Tylenol), Serum: <10 ug/mL — ABNORMAL LOW (ref 10–30)

## 2014-12-13 MED ORDER — HALOPERIDOL LACTATE 5 MG/ML IJ SOLN
INTRAMUSCULAR | Status: AC
  Administered 2014-12-13: 5 mg via INTRAVENOUS
  Filled 2014-12-13: qty 1

## 2014-12-13 MED ORDER — HALOPERIDOL LACTATE 5 MG/ML IJ SOLN
5.0000 mg | Freq: Once | INTRAMUSCULAR | Status: AC
  Administered 2014-12-13: 5 mg via INTRAVENOUS

## 2014-12-13 NOTE — Discharge Instructions (Signed)
You are leaving against medical advice.  As we explained, you received multiple doses of sedation medication to treat your seizure-like episodes and it is not safe to give additional pain medicine at this time.  We offered to obtain a CT scan of your abdomen and pelvis to evaluate for the possibility of problems within your abdomen, but you are refusing any additional treatment at this time.  Please note that you can return to the emergency department any time for further evaluation.  We encourage you to follow up with your regular doctor at the next available opportunity and continue taking all of your medications.    Abdominal Pain, Women Abdominal (stomach, pelvic, or belly) pain can be caused by many things. It is important to tell your doctor:  The location of the pain.  Does it come and go or is it present all the time?  Are there things that start the pain (eating certain foods, exercise)?  Are there other symptoms associated with the pain (fever, nausea, vomiting, diarrhea)? All of this is helpful to know when trying to find the cause of the pain. CAUSES   Stomach: virus or bacteria infection, or ulcer.  Intestine: appendicitis (inflamed appendix), regional ileitis (Crohn's disease), ulcerative colitis (inflamed colon), irritable bowel syndrome, diverticulitis (inflamed diverticulum of the colon), or cancer of the stomach or intestine.  Gallbladder disease or stones in the gallbladder.  Kidney disease, kidney stones, or infection.  Pancreas infection or cancer.  Fibromyalgia (pain disorder).  Diseases of the female organs:  Uterus: fibroid (non-cancerous) tumors or infection.  Fallopian tubes: infection or tubal pregnancy.  Ovary: cysts or tumors.  Pelvic adhesions (scar tissue).  Endometriosis (uterus lining tissue growing in the pelvis and on the pelvic organs).  Pelvic congestion syndrome (female organs filling up with blood just before the menstrual  period).  Pain with the menstrual period.  Pain with ovulation (producing an egg).  Pain with an IUD (intrauterine device, birth control) in the uterus.  Cancer of the female organs.  Functional pain (pain not caused by a disease, may improve without treatment).  Psychological pain.  Depression. DIAGNOSIS  Your doctor will decide the seriousness of your pain by doing an examination.  Blood tests.  X-rays.  Ultrasound.  CT scan (computed tomography, special type of X-ray).  MRI (magnetic resonance imaging).  Cultures, for infection.  Barium enema (dye inserted in the large intestine, to better view it with X-rays).  Colonoscopy (looking in intestine with a lighted tube).  Laparoscopy (minor surgery, looking in abdomen with a lighted tube).  Major abdominal exploratory surgery (looking in abdomen with a large incision). TREATMENT  The treatment will depend on the cause of the pain.   Many cases can be observed and treated at home.  Over-the-counter medicines recommended by your caregiver.  Prescription medicine.  Antibiotics, for infection.  Birth control pills, for painful periods or for ovulation pain.  Hormone treatment, for endometriosis.  Nerve blocking injections.  Physical therapy.  Antidepressants.  Counseling with a psychologist or psychiatrist.  Minor or major surgery. HOME CARE INSTRUCTIONS   Do not take laxatives, unless directed by your caregiver.  Take over-the-counter pain medicine only if ordered by your caregiver. Do not take aspirin because it can cause an upset stomach or bleeding.  Try a clear liquid diet (broth or water) as ordered by your caregiver. Slowly move to a bland diet, as tolerated, if the pain is related to the stomach or intestine.  Have a thermometer and  take your temperature several times a day, and record it.  Bed rest and sleep, if it helps the pain.  Avoid sexual intercourse, if it causes pain.  Avoid  stressful situations.  Keep your follow-up appointments and tests, as your caregiver orders.  If the pain does not go away with medicine or surgery, you may try:  Acupuncture.  Relaxation exercises (yoga, meditation).  Group therapy.  Counseling. SEEK MEDICAL CARE IF:   You notice certain foods cause stomach pain.  Your home care treatment is not helping your pain.  You need stronger pain medicine.  You want your IUD removed.  You feel faint or lightheaded.  You develop nausea and vomiting.  You develop a rash.  You are having side effects or an allergy to your medicine. SEEK IMMEDIATE MEDICAL CARE IF:   Your pain does not go away or gets worse.  You have a fever.  Your pain is felt only in portions of the abdomen. The right side could possibly be appendicitis. The left lower portion of the abdomen could be colitis or diverticulitis.  You are passing blood in your stools (bright red or black tarry stools, with or without vomiting).  You have blood in your urine.  You develop chills, with or without a fever.  You pass out. MAKE SURE YOU:   Understand these instructions.  Will watch your condition.  Will get help right away if you are not doing well or get worse. Document Released: 01/06/2007 Document Revised: 07/26/2013 Document Reviewed: 01/26/2009 [facility] Patient Information 2015 [place], [place]. This information is not intended to replace advice given to you by your health care provider. Make sure you discuss any questions you have with your health care provider.

## 2014-12-13 NOTE — ED Provider Notes (Signed)
[facility] Emergency Department Provider Note  ____________________________________________  Time seen: Approximately 5:06 PM  I have reviewed the triage vital signs and the nursing notes.   HISTORY  Chief Complaint Seizures  The patient is minimally cooperative with both history and the physical limits the exam  HPI Rachael Gray is a 34 y.o. female with an extensive history ofmultiple emergency department visits recently, pseudoseizures, possibly true seizures (she takes [place] as an outpatient), narcotic seeking behavior, and medical noncompliance presents by EMS as an emergency traffic for persistent seizure-like activity.  Reportedly they were called out to the house where she had been having a generalized seizure for was described as 15 minutes.  She stopped spontaneously after their arrival and immediately started talking to them.  She was then put in the back of the ambulance where she began having seizure-like activity again.  She received 2 separate doses of Versed 2 mg IV in route to the hospital.  She was still having some generalized shaking throughout her body when she arrived to the emergency department.  Per EMS her vital signs were stable.  Upon arrival in the emergency department I applied a brisk sternal rub and she immediately woke up and stopped the seizure like activity.  She spoke with me coherently although she is somnolent, likely from the Versed 4 mg received prior to arrival.  She is protecting her airway and does not appear to be in any acute distress after the completion of the seizure-like episodes.   Past Medical History  Diagnosis Date  . Diabetes mellitus without complication   . Seizures   . Pseudoseizures     Patient Active Problem List   Diagnosis Date Noted  . Intractable nausea and vomiting 12/10/2014  . Nausea & vomiting 12/10/2014  . Narcotic abuse 12/09/2014  . Sepsis 12/07/2014  . Anxiety   . Pyelonephritis  12/04/2014  . Lactic acidosis 12/04/2014    Class: Acute    Past Surgical History  Procedure Laterality Date  . Appendectomy    . Cholecystectomy      Current Outpatient Rx  Name  Route  Sig  Dispense  Refill  . cephALEXin (KEFLEX) 500 MG capsule   Oral   Take 1 capsule (500 mg total) by mouth 3 (three) times daily.   7 capsule   0   . cephALEXin (KEFLEX) 500 MG capsule   Oral   Take 1 capsule (500 mg total) by mouth 3 (three) times daily.   12 capsule   0   . insulin NPH-regular Human (NOVOLIN 70/30) (70-30) 100 UNIT/ML injection   Subcutaneous   Inject 22-25 Units into the skin 2 (two) times daily with a meal. 25 units in the morning and 22 units in the evening         . levETIRAcetam (KEPPRA) 750 MG tablet   Oral   Take 1 tablet (750 mg total) by mouth 2 (two) times daily.   60 tablet   0   . oxyCODONE (OXY IR/ROXICODONE) 5 MG immediate release tablet   Oral   Take 1 tablet (5 mg total) by mouth every 4 (four) hours as needed for moderate pain or severe pain.   15 tablet   0     Allergies Doxycycline; Morphine and related; Reglan; and Tylenol  History reviewed. No pertinent family history.  Social History Social History  Substance Use Topics  . Smoking status: Former Smoker -- 2.00 packs/day    Types: Cigarettes  .  Smokeless tobacco: None  . Alcohol Use: Yes    Review of Systems Unable to obtain, the patient will state only that her abdomen hurts  ____________________________________________   PHYSICAL EXAM:  VITAL SIGNS: ED Triage Vitals  Enc Vitals Group     BP 12/13/14 1601 129/91 mmHg     Pulse Rate 12/13/14 1601 74     Resp 12/13/14 1601 13     Temp 12/13/14 1601 98.7 F (37.1 C)     Temp Source 12/13/14 1601 Oral     SpO2 12/13/14 1601 100 %     Weight --      Height --      Head Cir --      Peak Flow --      Pain Score --      Pain Loc --      Pain Edu? --      Excl. in [place]? --     Constitutional: somnolent but Alert. No  acute distress after the completion of her Seizure-like activity. Eyes: Conjunctivae are normal. PERRL. EOMI. Head: Atraumatic. Nose: No congestion/rhinnorhea. Mouth/Throat: Mucous membranes are moist.  Oropharynx non-erythematous. Neck: No stridor.  No cervical spine tenderness to palpation. Cardiovascular: Normal rate, regular rhythm. Grossly normal heart sounds.  Good peripheral circulation. Respiratory: Normal respiratory effort.  No retractions. Lungs CTAB. Gastrointestinal: Soft without distention.  Initially the patient did not endorse any tenderness, then stated that her abdomen was hurting.  There is no specific location that appears tender. Musculoskeletal: No lower extremity tenderness nor edema.  No joint effusions. Neurologic:  Slurred speech but appropriate. No gross focal neurologic deficits are appreciated.  Skin:  Skin is warm, dry and intact. No rash noted. Psychiatric: Mood and affect are somnolent after the Versed. Speech is slurred.  ____________________________________________   LABS (all labs ordered are listed, but only abnormal results are displayed)  Labs Reviewed  URINE DRUG SCREEN, QUALITATIVE ([facility] ONLY) - Abnormal; Notable for the following:    Cannabinoid 50 Ng, Ur [place] POSITIVE (*)    Benzodiazepine, Ur Scrn POSITIVE (*)    All other components within normal limits  URINALYSIS COMPLETEWITH MICROSCOPIC ([facility] ONLY) - Abnormal; Notable for the following:    Color, Urine YELLOW (*)    APPearance HAZY (*)    Glucose, UA >500 (*)    Leukocytes, UA 3+ (*)    Squamous Epithelial / LPF 0-5 (*)    All other components within normal limits  ACETAMINOPHEN LEVEL - Abnormal; Notable for the following:    Acetaminophen (Tylenol), Serum <10 (*)    All other components within normal limits  URINE CULTURE  HCG, QUANTITATIVE, PREGNANCY  LIPASE, BLOOD  LACTIC ACID, PLASMA  ETHANOL  SALICYLATE LEVEL  LACTIC ACID, PLASMA  CBC WITH DIFFERENTIAL/PLATELET   COMPREHENSIVE METABOLIC PANEL  CBG MONITORING, ED   ____________________________________________  EKG  Unable to obtain due to patient cooperation ____________________________________________  RADIOLOGY   No results found.  ____________________________________________   PROCEDURES  Procedure(s) performed: None  Critical Care performed: Yes, see critical care note(s)   CRITICAL CARE Performed by: Hinda Kehr   Total critical care time: 30 minutes  Critical care time was exclusive of separately billable procedures and treating other patients.  Critical care was necessary to treat or prevent imminent or life-threatening deterioration.  Critical care was time spent personally by me on the following activities: development of treatment plan with patient and/or surrogate as well as nursing, discussions with consultants, evaluation of patient's response to treatment,  examination of patient, obtaining history from patient or surrogate, ordering and performing treatments and interventions, ordering and review of laboratory studies, ordering and review of radiographic studies, pulse oximetry and re-evaluation of patient's condition.  ____________________________________________   INITIAL IMPRESSION / ASSESSMENT AND PLAN / ED COURSE  Pertinent labs & imaging results that were available during my care of the patient were reviewed by me and considered in my medical decision making (see chart for details).  I initially evaluated the patient concerned for status epilepticus.  However, it became apparent immediately after arrival that her seizure-like activity did not appear consistent with epileptic seizures.  A sternal rub completely stopped in she would open her eyes and loudly yelled "ouch".  She was somnolent after the Versed.  I had an opportunity to review her prior records and saw that she has had at least 4-5 recent emergency department visits including 2 hospitalizations,  initially for pyelonephritis and then for intractable nausea and vomiting.  Her presentation at that point was very similar to today's episode.  It is documented that after the completion of her seizure-like activity the last time, she was observed forcing episodes of emesis.  She received Haldol and multiple rounds of Dilaudid for pain control.  She was eventually admitted for intractable nausea and vomiting.  Her hospital course was unremarkable and she was discharged without a specific diagnosis.that was 3 days ago.  She has had several rounds of antibiotics for urinary tract infection.  My plan was to check all relevant labs and obtain a IV contrast CT abdomen/pelvis to evaluate for intra-abdominal pathology including but not limited to pyelonephritis, the possibility of a tubo-ovarian abscess, other etiology that has not been identified yet.  After my initial evaluation review of her hospital records, she began stating that she was nauseated and vomiting again.I gave her Haldol 5 mg IV which worked well the last time.  Shortly thereafter she called out and began demanding pain medicine.  I explained that I was not comfortable providing narcotic medicine at this time given all her recent evaluations and all the sedation occasion she has had today.  I explained that I have a workup plan that includes a CT scan, but she became very angry and belligerent and stated that she wanted to go.  She called a family member and signed out AMA while I was taking care of a critical STEMI patient.  She was observed ambulating without difficulty and was directly visualize getting into the car of her sober family member and departing before I could complete her discharge paperwork or speak with her again.    ____________________________________________  FINAL CLINICAL IMPRESSION(S) / ED DIAGNOSES  Final diagnoses:  Pseudoseizures  Nausea and vomiting, vomiting of unspecified type  Opioid abuse  Drug-seeking  behavior  Abdominal pain, unspecified abdominal location  Persistent UTI   NEW MEDICATIONS STARTED DURING THIS VISIT:  Discharge Medication List as of 12/13/2014  5:28 PM       Hinda Kehr, MD 12/13/14 8088

## 2014-12-14 LAB — GLUCOSE, CAPILLARY: GLUCOSE-CAPILLARY: 217 mg/dL — AB (ref 65–99)

## 2014-12-15 LAB — URINE CULTURE
CULTURE: NO GROWTH
SPECIAL REQUESTS: NORMAL

## 2015-01-24 ENCOUNTER — Encounter: Payer: Self-pay | Admitting: Emergency Medicine

## 2015-01-24 ENCOUNTER — Emergency Department
Admission: EM | Admit: 2015-01-24 | Discharge: 2015-01-24 | Disposition: A | Discharge status: Home / Self Care | Payer: Self-pay | Attending: Emergency Medicine | Admitting: Emergency Medicine

## 2015-01-24 DIAGNOSIS — Z79899 Other long term (current) drug therapy: Secondary | ICD-10-CM | POA: Insufficient documentation

## 2015-01-24 DIAGNOSIS — E119 Type 2 diabetes mellitus without complications: Secondary | ICD-10-CM | POA: Insufficient documentation

## 2015-01-24 DIAGNOSIS — A084 Viral intestinal infection, unspecified: Secondary | ICD-10-CM | POA: Insufficient documentation

## 2015-01-24 DIAGNOSIS — F419 Anxiety disorder, unspecified: Secondary | ICD-10-CM | POA: Insufficient documentation

## 2015-01-24 DIAGNOSIS — Z3202 Encounter for pregnancy test, result negative: Secondary | ICD-10-CM | POA: Insufficient documentation

## 2015-01-24 DIAGNOSIS — Z794 Long term (current) use of insulin: Secondary | ICD-10-CM | POA: Insufficient documentation

## 2015-01-24 DIAGNOSIS — Z87891 Personal history of nicotine dependence: Secondary | ICD-10-CM | POA: Insufficient documentation

## 2015-01-24 HISTORY — DX: Reserved for concepts with insufficient information to code with codable children: IMO0002

## 2015-01-24 HISTORY — DX: Systemic lupus erythematosus, unspecified: M32.9

## 2015-01-24 HISTORY — DX: Gastroparesis: K31.84

## 2015-01-24 LAB — URINALYSIS COMPLETE WITH MICROSCOPIC (ARMC ONLY)
BACTERIA UA: NONE SEEN
Bilirubin Urine: NEGATIVE
Glucose, UA: >500 mg/dL — AB
Hgb urine dipstick: NEGATIVE
Nitrite: NEGATIVE
PH: 6 (ref 5.0–8.0)
PROTEIN: 30 mg/dL — AB
Specific Gravity, Urine: 1.035 — ABNORMAL HIGH (ref 1.005–1.030)

## 2015-01-24 LAB — PREGNANCY, URINE: Preg Test, Ur: NEGATIVE

## 2015-01-24 MED ORDER — RANITIDINE HCL 150 MG PO CAPS: 150.0000 mg | ORAL_CAPSULE | Freq: Two times a day (BID) | ORAL | Status: DC

## 2015-01-24 MED ORDER — DICYCLOMINE HCL 10 MG PO CAPS
10.0000 mg | ORAL_CAPSULE | Freq: Once | ORAL | Status: AC
  Administered 2015-01-24: 10 mg via ORAL
  Filled 2015-01-24: qty 1

## 2015-01-24 MED ORDER — DICYCLOMINE HCL 20 MG PO TABS: 20.0000 mg | ORAL_TABLET | Freq: Three times a day (TID) | ORAL | Status: DC | PRN

## 2015-01-24 MED ORDER — PROMETHAZINE HCL 25 MG PO TABS: 25.0000 mg | ORAL_TABLET | Freq: Four times a day (QID) | ORAL | Status: DC | PRN

## 2015-01-24 MED ORDER — ONDANSETRON HCL 4 MG/2ML IJ SOLN
4.0000 mg | Freq: Once | INTRAMUSCULAR | Status: AC
  Administered 2015-01-24: 4 mg via INTRAVENOUS
  Filled 2015-01-24: qty 2

## 2015-01-24 MED ORDER — DEXTROSE 5 % AND 0.9 % NACL IV BOLUS
1000.0000 mL | Freq: Once | INTRAVENOUS | Status: AC
  Administered 2015-01-24: 1000 mL via INTRAVENOUS
  Filled 2015-01-24: qty 1000

## 2015-01-24 MED ORDER — GI COCKTAIL ~~LOC~~
30.0000 mL | ORAL | Status: AC
Start: 2015-01-24 — End: 2015-01-24
  Administered 2015-01-24: 30 mL via ORAL
  Filled 2015-01-24: qty 30

## 2015-01-24 MED ORDER — FAMOTIDINE 20 MG PO TABS
40.0000 mg | ORAL_TABLET | Freq: Once | ORAL | Status: AC
  Administered 2015-01-24: 40 mg via ORAL
  Filled 2015-01-24: qty 2

## 2015-01-24 NOTE — ED Notes (Signed)
AAOx3.  Skin warm and dry.  Moving all extremities equally and strong.  Ambulates with easy and steady gait.  NAD

## 2015-01-24 NOTE — ED Provider Notes (Signed)
[facility] Emergency Department Provider Note  ____________________________________________  Time seen: 12:45 PM  I have reviewed the triage vital signs and the nursing notes.   HISTORY  Chief Complaint Seizures    HPI Rachael Gray is a 34 y.o. female who complains of some mild bilateral frontal headache with nausea vomiting and diarrhea for the past 2 days. She reports that multiple friends recently been sick with the flu and other viral illnesses. She also complains of body aches. No chest pain shortness of breath fevers. She has had some chills. No blood in emesis or stool. No dysuria frequency urgency.  Contrary to triage note, the patient denies any recent seizures to me.   Past Medical History  Diagnosis Date  . Diabetes mellitus without complication ([place])   . Seizures ([place])   . Pseudoseizures   . Gastroparesis   . Lupus [facility])      Patient Active Problem List   Diagnosis Date Noted  . Intractable nausea and vomiting 12/10/2014  . Nausea & vomiting 12/10/2014  . Narcotic abuse 12/09/2014  . Sepsis ([place]) 12/07/2014  . Anxiety   . Pyelonephritis 12/04/2014  . Lactic acidosis 12/04/2014    Class: Acute     Past Surgical History  Procedure Laterality Date  . Appendectomy    . Cholecystectomy       Current Outpatient Rx  Name  Route  Sig  Dispense  Refill  . insulin NPH-regular Human (NOVOLIN 70/30) (70-30) 100 UNIT/ML injection   Subcutaneous   Inject 22-25 Units into the skin 2 (two) times daily with a meal. 25 units in the morning and 22 units in the evening         . levETIRAcetam (KEPPRA) 750 MG tablet   Oral   Take 1 tablet (750 mg total) by mouth 2 (two) times daily.   60 tablet   0   . cephALEXin (KEFLEX) 500 MG capsule   Oral   Take 1 capsule (500 mg total) by mouth 3 (three) times daily. Patient not taking: Reported on 01/24/2015   12 capsule   0   . dicyclomine (BENTYL) 20 MG tablet   Oral   Take 1  tablet (20 mg total) by mouth 3 (three) times daily as needed for spasms.   30 tablet   0   . oxyCODONE (OXY IR/ROXICODONE) 5 MG immediate release tablet   Oral   Take 1 tablet (5 mg total) by mouth every 4 (four) hours as needed for moderate pain or severe pain. Patient not taking: Reported on 01/24/2015   15 tablet   0   . promethazine (PHENERGAN) 25 MG tablet   Oral   Take 1 tablet (25 mg total) by mouth every 6 (six) hours as needed for nausea or vomiting.   15 tablet   0   . ranitidine (ZANTAC) 150 MG capsule   Oral   Take 1 capsule (150 mg total) by mouth 2 (two) times daily.   28 capsule   0      Allergies Doxycycline; Morphine and related; Reglan; and Tylenol   No family history on file.  Social History Social History  Substance Use Topics  . Smoking status: Former Smoker -- 2.00 packs/day    Types: Cigarettes  . Smokeless tobacco: None  . Alcohol Use: Yes    Review of Systems  Constitutional:   No fever or chills. No weight changes Eyes:   No blurry vision or double vision.  ENT:  No sore throat. Cardiovascular:   No chest pain. Respiratory:   No dyspnea or cough. Gastrointestinal:   Positive generalized abdominal pain with vomiting and diarrhea.  No BRBPR or melena. Genitourinary:   Negative for dysuria, urinary retention, bloody urine, or difficulty urinating. Musculoskeletal:   Negative for back pain. No joint swelling or pain. Skin:   Negative for rash. Neurological:   Negative for headaches, focal weakness or numbness. Psychiatric:  Positive anxiety , denies depression.   Endocrine:  No hot/cold intolerance, changes in energy, or sleep difficulty.  10-point ROS otherwise negative.  ____________________________________________   PHYSICAL EXAM:  VITAL SIGNS: ED Triage Vitals  Enc Vitals Group     BP 01/24/15 1117 164/139 mmHg     Pulse Rate 01/24/15 1117 110     Resp 01/24/15 1117 21     Temp 01/24/15 1117 98.2 F (36.8 C)     Temp  Source 01/24/15 1117 Oral     SpO2 01/24/15 1117 100 %     Weight 01/24/15 1117 147 lb (66.679 kg)     Height 01/24/15 1117 5\' 5"  (1.651 m)     Head Cir --      Peak Flow --      Pain Score 01/24/15 1118 6     Pain Loc --      Pain Edu? --      Excl. in [place]? --      Constitutional:   Alert and oriented. Well appearing and in no distress. Eyes:   No scleral icterus. No conjunctival pallor. PERRL. EOMI ENT   Head:   Normocephalic and atraumatic.   Nose:   No congestion/rhinnorhea. No septal hematoma   Mouth/Throat:   MMM, no pharyngeal erythema. No peritonsillar mass. No uvula shift.   Neck:   No stridor. No SubQ emphysema. No meningismus. Hematological/Lymphatic/Immunilogical:   No cervical lymphadenopathy. Cardiovascular:   RRR heart rate 90. Normal and symmetric distal pulses are present in all extremities. No murmurs, rubs, or gallops. Respiratory:   Normal respiratory effort without tachypnea nor retractions. Breath sounds are clear and equal bilaterally. No wheezes/rales/rhonchi. Gastrointestinal:   Soft with mild generalized tenderness, nonfocal. No distention. There is no CVA tenderness.  No rebound, rigidity, or guarding. Genitourinary:   deferred Musculoskeletal:   Nontender with normal range of motion in all extremities. No joint effusions.  No lower extremity tenderness.  No edema. Neurologic:   Normal speech and language.  CN 2-10 normal. Motor grossly intact. No pronator drift.  Normal gait. No gross focal neurologic deficits are appreciated.  Skin:    Skin is warm, dry and intact. No rash noted.  No petechiae, purpura, or bullae. Psychiatric:   Mood and affect are normal. Speech and behavior are normal. Patient exhibits appropriate insight and judgment.  ____________________________________________    LABS (pertinent positives/negatives) (all labs ordered are listed, but only abnormal results are displayed) Labs Reviewed  URINALYSIS COMPLETEWITH  MICROSCOPIC ([facility] ONLY) - Abnormal; Notable for the following:    Color, Urine YELLOW (*)    APPearance CLEAR (*)    Glucose, UA >500 (*)    Ketones, ur TRACE (*)    Specific Gravity, Urine 1.035 (*)    Protein, ur 30 (*)    Leukocytes, UA TRACE (*)    Squamous Epithelial / LPF 6-30 (*)    All other components within normal limits  PREGNANCY, URINE  POC URINE PREG, ED   ____________________________________________   EKG    ____________________________________________    RADIOLOGY  ____________________________________________   PROCEDURES   ____________________________________________   INITIAL IMPRESSION / ASSESSMENT AND PLAN / ED COURSE  Pertinent labs & imaging results that were available during my care of the patient were reviewed by me and considered in my medical decision making (see chart for details).  Patient presents with abdominal pain nausea vomiting diarrhea consistent with viral gastroenteritis particularly with history of multiple Friends who have  been sick with fairly significant viral syndromes recently. Vital signs are unremarkable, urinalysis and urine pregnancy tests are unremarkable, patient is not in distress and overall well appearing although she is uncomfortable. We'll concentrate on symptom relief and expect that this illness should be self-limited. Patient has had a flu shot already this year. Low suspicion for ectopic, obstruction or perforation, pancreatitis cholecystitis or AAA.   ____________________________________________   FINAL CLINICAL IMPRESSION(S) / ED DIAGNOSES  Final diagnoses:  Viral gastroenteritis      Carrie Mew, MD 01/24/15 1345

## 2015-01-24 NOTE — ED Notes (Signed)
Patient to ER for c/o seizure via EMS. Patient felt nauseated this am and got up to use restroom. Had episode of diarrhea, then onset of headache. States she has seizure like activity with increased anxiety. Patient with full body jerking, but able to speak during movements. Patient reports increased anxiety currently.

## 2015-01-24 NOTE — Discharge Instructions (Signed)
Viral Gastroenteritis °Viral gastroenteritis is also known as stomach flu. This condition affects the stomach and intestinal tract. It can cause sudden diarrhea and vomiting. The illness typically lasts 3 to 8 days. Most people develop an immune response that eventually gets rid of the virus. While this natural response develops, the virus can make you quite ill. °CAUSES  °Many different viruses can cause gastroenteritis, such as rotavirus or noroviruses. You can catch one of these viruses by consuming contaminated food or water. You may also catch a virus by sharing utensils or other personal items with an infected person or by touching a contaminated surface. °SYMPTOMS  °The most common symptoms are diarrhea and vomiting. These problems can cause a severe loss of body fluids (dehydration) and a body salt (electrolyte) imbalance. Other symptoms may include: °· Fever. °· Headache. °· Fatigue. °· Abdominal pain. °DIAGNOSIS  °Your caregiver can usually diagnose viral gastroenteritis based on your symptoms and a physical exam. A stool sample may also be taken to test for the presence of viruses or other infections. °TREATMENT  °This illness typically goes away on its own. Treatments are aimed at rehydration. The most serious cases of viral gastroenteritis involve vomiting so severely that you are not able to keep fluids down. In these cases, fluids must be given through an intravenous line (IV). °HOME CARE INSTRUCTIONS  °· Drink enough fluids to keep your urine clear or pale yellow. Drink small amounts of fluids frequently and increase the amounts as tolerated. °· Ask your caregiver for specific rehydration instructions. °· Avoid: °¨ Foods high in sugar. °¨ Alcohol. °¨ Carbonated drinks. °¨ Tobacco. °¨ Juice. °¨ Caffeine drinks. °¨ Extremely hot or cold fluids. °¨ Fatty, greasy foods. °¨ Too much intake of anything at one time. °¨ Dairy products until 24 to 48 hours after diarrhea stops. °· You may consume probiotics.  Probiotics are active cultures of beneficial bacteria. They may lessen the amount and number of diarrheal stools in adults. Probiotics can be found in yogurt with active cultures and in supplements. °· Wash your hands well to avoid spreading the virus. °· Only take over-the-counter or prescription medicines for pain, discomfort, or fever as directed by your caregiver. Do not give aspirin to children. Antidiarrheal medicines are not recommended. °· Ask your caregiver if you should continue to take your regular prescribed and over-the-counter medicines. °· Keep all follow-up appointments as directed by your caregiver. °SEEK IMMEDIATE MEDICAL CARE IF:  °· You are unable to keep fluids down. °· You do not urinate at least once every 6 to 8 hours. °· You develop shortness of breath. °· You notice blood in your stool or vomit. This may look like coffee grounds. °· You have abdominal pain that increases or is concentrated in one small area (localized). °· You have persistent vomiting or diarrhea. °· You have a fever. °· The patient is a child younger than 3 months, and he or she has a fever. °· The patient is a child older than 3 months, and he or she has a fever and persistent symptoms. °· The patient is a child older than 3 months, and he or she has a fever and symptoms suddenly get worse. °· The patient is a baby, and he or she has no tears when crying. °MAKE SURE YOU:  °· Understand these instructions. °· Will watch your condition. °· Will get help right away if you are not doing well or get worse. °  °This information is not intended to replace   advice given to you by your health care provider. Make sure you discuss any questions you have with your health care provider.   Document Released: 03/11/2005 Document Revised: 06/03/2011 Document Reviewed: 12/26/2010 Elsevier Interactive Patient Education 2016 [place] Choices to Help Relieve Diarrhea, Adult When you have diarrhea, the foods you eat and your  eating habits are very important. Choosing the right foods and drinks can help relieve diarrhea. Also, because diarrhea can last up to 7 days, you need to replace lost fluids and electrolytes (such as sodium, potassium, and chloride) in order to help prevent dehydration.  WHAT GENERAL GUIDELINES DO I NEED TO FOLLOW?  Slowly drink 1 cup (8 oz) of fluid for each episode of diarrhea. If you are getting enough fluid, your urine will be clear or pale yellow.  Eat starchy foods. Some good choices include white rice, white toast, pasta, low-fiber cereal, baked potatoes (without the skin), saltine crackers, and bagels.  Avoid large servings of any cooked vegetables.  Limit fruit to two servings per day. A serving is  cup or 1 small piece.  Choose foods with less than 2 g of fiber per serving.  Limit fats to less than 8 tsp (38 g) per day.  Avoid fried foods.  Eat foods that have probiotics in them. Probiotics can be found in certain dairy products.  Avoid foods and beverages that may increase the speed at which food moves through the stomach and intestines (gastrointestinal tract). Things to avoid include:  High-fiber foods, such as dried fruit, raw fruits and vegetables, nuts, seeds, and whole grain foods.  Spicy foods and high-fat foods.  Foods and beverages sweetened with high-fructose corn syrup, honey, or sugar alcohols such as xylitol, sorbitol, and mannitol. WHAT FOODS ARE RECOMMENDED? Grains White rice. White, [place], or pita breads (fresh or toasted), including plain rolls, buns, or bagels. White pasta. Saltine, soda, or graham crackers. Pretzels. Low-fiber cereal. Cooked cereals made with water (such as cornmeal, farina, or cream cereals). Plain muffins. Matzo. Melba toast. Zwieback.  Vegetables Potatoes (without the skin). Strained tomato and vegetable juices. Most well-cooked and canned vegetables without seeds. Tender lettuce. Fruits Cooked or canned applesauce, apricots,  cherries, fruit cocktail, grapefruit, peaches, pears, or plums. Fresh bananas, apples without skin, cherries, grapes, cantaloupe, grapefruit, peaches, oranges, or plums.  Meat and Other Protein Products Baked or boiled chicken. Eggs. Tofu. Fish. Seafood. Smooth peanut butter. Ground or well-cooked tender beef, ham, veal, lamb, pork, or poultry.  Dairy Plain yogurt, kefir, and unsweetened liquid yogurt. Lactose-free milk, buttermilk, or soy milk. Plain hard cheese. Beverages Sport drinks. Clear broths. Diluted fruit juices (except prune). Regular, caffeine-free sodas such as ginger ale. Water. Decaffeinated teas. Oral rehydration solutions. Sugar-free beverages not sweetened with sugar alcohols. Other Bouillon, broth, or soups made from recommended foods.  The items listed above may not be a complete list of recommended foods or beverages. Contact your dietitian for more options. WHAT FOODS ARE NOT RECOMMENDED? Grains Whole grain, whole wheat, bran, or rye breads, rolls, pastas, crackers, and cereals. Wild or brown rice. Cereals that contain more than 2 g of fiber per serving. Corn tortillas or taco shells. Cooked or dry oatmeal. Granola. Popcorn. Vegetables Raw vegetables. Cabbage, broccoli, Brussels sprouts, artichokes, baked beans, beet greens, corn, kale, legumes, peas, sweet potatoes, and yams. Potato skins. Cooked spinach and cabbage. Fruits Dried fruit, including raisins and dates. Raw fruits. Stewed or dried prunes. Fresh apples with skin, apricots, mangoes, pears, raspberries, and strawberries.  Meat  and Other Protein Products Chunky peanut butter. Nuts and seeds. Beans and lentils. Berniece Salines.  Dairy High-fat cheeses. Milk, chocolate milk, and beverages made with milk, such as milk shakes. Cream. Ice cream. Sweets and Desserts Sweet rolls, doughnuts, and sweet breads. Pancakes and waffles. Fats and Oils Butter. Cream sauces. Margarine. Salad oils. Plain salad dressings. Olives. Avocados.    Beverages Caffeinated beverages (such as coffee, tea, soda, or energy drinks). Alcoholic beverages. Fruit juices with pulp. Prune juice. Soft drinks sweetened with high-fructose corn syrup or sugar alcohols. Other Coconut. Hot sauce. Chili powder. Mayonnaise. Gravy. Cream-based or milk-based soups.  The items listed above may not be a complete list of foods and beverages to avoid. Contact your dietitian for more information. WHAT SHOULD I DO IF I BECOME DEHYDRATED? Diarrhea can sometimes lead to dehydration. Signs of dehydration include dark urine and dry mouth and skin. If you think you are dehydrated, you should rehydrate with an oral rehydration solution. These solutions can be purchased at pharmacies, retail stores, or online.  Drink -1 cup (120-240 mL) of oral rehydration solution each time you have an episode of diarrhea. If drinking this amount makes your diarrhea worse, try drinking smaller amounts more often. For example, drink 1-3 tsp (5-15 mL) every 5-10 minutes.  A general rule for staying hydrated is to drink 1-2 L of fluid per day. Talk to your health care provider about the specific amount you should be drinking each day. Drink enough fluids to keep your urine clear or pale yellow.   This information is not intended to replace advice given to you by your health care provider. Make sure you discuss any questions you have with your health care provider.   Document Released: 06/01/2003 Document Revised: 04/01/2014 Document Reviewed: 02/01/2013 Elsevier Interactive Patient Education [employer].

## 2015-02-01 ENCOUNTER — Emergency Department
Admission: EM | Admit: 2015-02-01 | Discharge: 2015-02-01 | Disposition: A | Discharge status: Home / Self Care | Payer: Self-pay | Attending: Emergency Medicine | Admitting: Emergency Medicine

## 2015-02-01 ENCOUNTER — Encounter: Payer: Self-pay | Admitting: *Deleted

## 2015-02-01 DIAGNOSIS — Z79899 Other long term (current) drug therapy: Secondary | ICD-10-CM | POA: Insufficient documentation

## 2015-02-01 DIAGNOSIS — R1010 Upper abdominal pain, unspecified: Secondary | ICD-10-CM | POA: Insufficient documentation

## 2015-02-01 DIAGNOSIS — Z792 Long term (current) use of antibiotics: Secondary | ICD-10-CM | POA: Insufficient documentation

## 2015-02-01 DIAGNOSIS — Z794 Long term (current) use of insulin: Secondary | ICD-10-CM | POA: Insufficient documentation

## 2015-02-01 DIAGNOSIS — G40909 Epilepsy, unspecified, not intractable, without status epilepticus: Secondary | ICD-10-CM | POA: Insufficient documentation

## 2015-02-01 DIAGNOSIS — Z87891 Personal history of nicotine dependence: Secondary | ICD-10-CM | POA: Insufficient documentation

## 2015-02-01 DIAGNOSIS — E119 Type 2 diabetes mellitus without complications: Secondary | ICD-10-CM | POA: Insufficient documentation

## 2015-02-01 DIAGNOSIS — R110 Nausea: Secondary | ICD-10-CM | POA: Insufficient documentation

## 2015-02-01 DIAGNOSIS — R109 Unspecified abdominal pain: Secondary | ICD-10-CM | POA: Insufficient documentation

## 2015-02-01 LAB — HEPATIC FUNCTION PANEL
ALBUMIN: 4 g/dL (ref 3.5–5.0)
ALT: 10 U/L — AB (ref 14–54)
AST: 15 U/L (ref 15–41)
Alkaline Phosphatase: 51 U/L (ref 38–126)
Bilirubin, Direct: <0.1 mg/dL — ABNORMAL LOW (ref 0.1–0.5)
TOTAL PROTEIN: 7.6 g/dL (ref 6.5–8.1)
Total Bilirubin: 0.4 mg/dL (ref 0.3–1.2)

## 2015-02-01 LAB — CBC WITH DIFFERENTIAL/PLATELET
BASOS ABS: 0.1 10*3/uL (ref 0–0.1)
BASOS PCT: 1 %
EOS PCT: 3 %
Eosinophils Absolute: 0.2 10*3/uL (ref 0–0.7)
HEMATOCRIT: 39.3 % (ref 35.0–47.0)
Hemoglobin: 13 g/dL (ref 12.0–16.0)
LYMPHS PCT: 17 %
Lymphs Abs: 1.4 10*3/uL (ref 1.0–3.6)
MCH: 28.7 pg (ref 26.0–34.0)
MCHC: 33.2 g/dL (ref 32.0–36.0)
MCV: 86.5 fL (ref 80.0–100.0)
MONO ABS: 0.5 10*3/uL (ref 0.2–0.9)
Monocytes Relative: 6 %
NEUTROS ABS: 6.2 10*3/uL (ref 1.4–6.5)
Neutrophils Relative %: 73 %
PLATELETS: 164 10*3/uL (ref 150–440)
RBC: 4.54 MIL/uL (ref 3.80–5.20)
RDW: 13.8 % (ref 11.5–14.5)
WBC: 8.4 10*3/uL (ref 3.6–11.0)

## 2015-02-01 LAB — BASIC METABOLIC PANEL
ANION GAP: 6 (ref 5–15)
BUN: 9 mg/dL (ref 6–20)
CALCIUM: 9.5 mg/dL (ref 8.9–10.3)
CO2: 28 mmol/L (ref 22–32)
Chloride: 102 mmol/L (ref 101–111)
Creatinine, Ser: 0.5 mg/dL (ref 0.44–1.00)
Glucose, Bld: 288 mg/dL — ABNORMAL HIGH (ref 65–99)
POTASSIUM: 3.9 mmol/L (ref 3.5–5.1)
Sodium: 136 mmol/L (ref 135–145)

## 2015-02-01 LAB — LIPASE, BLOOD: Lipase: 31 U/L (ref 11–51)

## 2015-02-01 MED ORDER — ONDANSETRON HCL 4 MG/2ML IJ SOLN
INTRAMUSCULAR | Status: AC
  Administered 2015-02-01: 4 mg via INTRAVENOUS
  Filled 2015-02-01: qty 2

## 2015-02-01 MED ORDER — FENTANYL CITRATE (PF) 100 MCG/2ML IJ SOLN
50.0000 ug | Freq: Once | INTRAMUSCULAR | Status: AC
  Administered 2015-02-01: 50 ug via INTRAVENOUS
  Filled 2015-02-01: qty 2

## 2015-02-01 MED ORDER — IBUPROFEN 600 MG PO TABS: 600.0000 mg | ORAL_TABLET | Freq: Once | ORAL | Status: DC

## 2015-02-01 MED ORDER — ONDANSETRON HCL 4 MG/2ML IJ SOLN
4.0000 mg | Freq: Once | INTRAMUSCULAR | Status: AC
  Administered 2015-02-01: 4 mg via INTRAVENOUS

## 2015-02-01 MED ORDER — SODIUM CHLORIDE 0.9 % IV BOLUS (SEPSIS)
1000.0000 mL | Freq: Once | INTRAVENOUS | Status: AC
  Administered 2015-02-01: 1000 mL via INTRAVENOUS

## 2015-02-01 NOTE — ED Provider Notes (Signed)
[facility] Emergency Department Provider Note    ____________________________________________  Time seen: 1810  I have reviewed the triage vital signs and the nursing notes.   HISTORY  Chief Complaint Seizures   History limited by: Not Limited   HPI Rachael Gray is a 34 y.o. female who presents to the emergency department today after a possible seizure activity. The patient has a history of seizures and pseudoseizures. She states that today she has had some abdominal pain and nausea. She states she did throw multiple times. There is then report that she had roughly a 15 minute seizure. Describes general tonic-clonic. EMS to give her Versed on scene. The patient at this time only complains of continued abdominal pain. She states it is located in the epigastric region. There is some radiation to her back. She endorses nonbloody emesis. No diarrhea. No fevers.   Past Medical History  Diagnosis Date  . Diabetes mellitus without complication ([place])   . Seizures ([place])   . Pseudoseizures   . Gastroparesis   . Lupus [facility])     Patient Active Problem List   Diagnosis Date Noted  . Intractable nausea and vomiting 12/10/2014  . Nausea & vomiting 12/10/2014  . Narcotic abuse 12/09/2014  . Sepsis ([place]) 12/07/2014  . Anxiety   . Pyelonephritis 12/04/2014  . Lactic acidosis 12/04/2014    Class: Acute    Past Surgical History  Procedure Laterality Date  . Appendectomy    . Cholecystectomy      Current Outpatient Rx  Name  Route  Sig  Dispense  Refill  . cephALEXin (KEFLEX) 500 MG capsule   Oral   Take 1 capsule (500 mg total) by mouth 3 (three) times daily. Patient not taking: Reported on 01/24/2015   12 capsule   0   . dicyclomine (BENTYL) 20 MG tablet   Oral   Take 1 tablet (20 mg total) by mouth 3 (three) times daily as needed for spasms.   30 tablet   0   . insulin NPH-regular Human (NOVOLIN 70/30) (70-30) 100 UNIT/ML injection  Subcutaneous   Inject 22-25 Units into the skin 2 (two) times daily with a meal. 25 units in the morning and 22 units in the evening         . levETIRAcetam (KEPPRA) 750 MG tablet   Oral   Take 1 tablet (750 mg total) by mouth 2 (two) times daily.   60 tablet   0   . oxyCODONE (OXY IR/ROXICODONE) 5 MG immediate release tablet   Oral   Take 1 tablet (5 mg total) by mouth every 4 (four) hours as needed for moderate pain or severe pain. Patient not taking: Reported on 01/24/2015   15 tablet   0   . promethazine (PHENERGAN) 25 MG tablet   Oral   Take 1 tablet (25 mg total) by mouth every 6 (six) hours as needed for nausea or vomiting.   15 tablet   0   . ranitidine (ZANTAC) 150 MG capsule   Oral   Take 1 capsule (150 mg total) by mouth 2 (two) times daily.   28 capsule   0     Allergies Doxycycline; Morphine and related; Reglan; and Tylenol  No family history on file.  Social History Social History  Substance Use Topics  . Smoking status: Former Smoker -- 2.00 packs/day    Types: Cigarettes  . Smokeless tobacco: None  . Alcohol Use: Yes    Review of Systems  Constitutional: Negative for fever. Cardiovascular: Negative for chest pain. Respiratory: Negative for shortness of breath. Gastrointestinal: Positive for upper abdominal pain. Genitourinary: Negative for dysuria. Musculoskeletal: Negative for back pain. Skin: Negative for rash. Neurological: Negative for headaches, focal weakness or numbness.   10-point ROS otherwise negative.  ____________________________________________   PHYSICAL EXAM:  VITAL SIGNS: ED Triage Vitals  Enc Vitals Group     BP 02/01/15 1750 118/98 mmHg     Pulse Rate 02/01/15 1750 108     Resp 02/01/15 1750 18     Temp 02/01/15 1750 98.8 F (37.1 C)     Temp Source 02/01/15 1750 Oral     SpO2 02/01/15 1745 97 %     Weight 02/01/15 1750 147 lb (66.679 kg)     Height 02/01/15 1750 5\' 5"  (1.651 m)     Head Cir --      Peak  Flow --      Pain Score 02/01/15 1751 9   Constitutional: Alert and oriented. Well appearing and in no distress. Eyes: Conjunctivae are normal. PERRL. Normal extraocular movements. ENT   Head: Normocephalic and atraumatic.   Nose: No congestion/rhinnorhea.   Mouth/Throat: Mucous membranes are moist.   Neck: No stridor. Hematological/Lymphatic/Immunilogical: No cervical lymphadenopathy. Cardiovascular: Normal rate, regular rhythm.  No murmurs, rubs, or gallops. Respiratory: Normal respiratory effort without tachypnea nor retractions. Breath sounds are clear and equal bilaterally. No wheezes/rales/rhonchi. Gastrointestinal: Soft and mildly tender to palpation in the epigastric. No distention.  Genitourinary: Deferred Musculoskeletal: Normal range of motion in all extremities. No joint effusions.  No lower extremity tenderness nor edema. Neurologic:  Normal speech and language. No gross focal neurologic deficits are appreciated.  Skin:  Skin is warm, dry and intact. No rash noted. Psychiatric: Mood and affect are normal. Speech and behavior are normal. Patient exhibits appropriate insight and judgment.  ____________________________________________    LABS (pertinent positives/negatives)  Labs Reviewed  BASIC METABOLIC PANEL - Abnormal; Notable for the following:    Glucose, Bld 288 (*)    All other components within normal limits  HEPATIC FUNCTION PANEL - Abnormal; Notable for the following:    ALT 10 (*)    Bilirubin, Direct <0.1 (*)    All other components within normal limits  CBC WITH DIFFERENTIAL/PLATELET  LIPASE, BLOOD     ____________________________________________   EKG  None  ____________________________________________    RADIOLOGY  None   ____________________________________________   PROCEDURES  Procedure(s) performed: None  Critical Care performed: No  ____________________________________________   INITIAL IMPRESSION / ASSESSMENT  AND PLAN / ED COURSE  Pertinent labs & imaging results that were available during my care of the patient were reviewed by me and considered in my medical decision making (see chart for details).  Patient presented to the emergency department today after an apparent seizure and abdominal pain. The patient did not have any further seizure activity here in the emergency department. Additionally the patient had blood work drawn to investigate for the abdominal pain. Prior to all the blood work returning however the patient asked to be discharged so she could take care of her children. Will give seizure precautions. Encourage neurology follow-up.  ____________________________________________   FINAL CLINICAL IMPRESSION(S) / ED DIAGNOSES  Final diagnoses:  Abdominal pain, unspecified abdominal location     Nance Pear, MD 02/02/15 1535

## 2015-02-01 NOTE — ED Notes (Signed)
Per EMS report, patient began seizing approximately 15 minutes prior to their arrival, per mother's report, and was still seizing when they arrived. Patient was given 5mg  Versed IM. Patient was alert in the ambulance and was able to answer simple questions, but arrived with eyes closed and head and feet shaking. Patient was able to sit up independently and help removed her T-shirt.

## 2015-02-01 NOTE — Discharge Instructions (Signed)
It is very important that you do not drive, go up on roofs, swim, or put yourself in any situation that might be dangerous for you or others if you were to have another seizure until you are cleared by a neurologist. Please seek medical attention for any high fevers, chest pain, shortness of breath, change in behavior, persistent vomiting, bloody stool or any other new or concerning symptoms.   Abdominal Pain, Adult Many things can cause abdominal pain. Usually, abdominal pain is not caused by a disease and will improve without treatment. It can often be observed and treated at home. Your health care provider will do a physical exam and possibly order blood tests and X-rays to help determine the seriousness of your pain. However, in many cases, more time must pass before a clear cause of the pain can be found. Before that point, your health care provider may not know if you need more testing or further treatment. HOME CARE INSTRUCTIONS Monitor your abdominal pain for any changes. The following actions may help to alleviate any discomfort you are experiencing:  Only take over-the-counter or prescription medicines as directed by your health care provider.  Do not take laxatives unless directed to do so by your health care provider.  Try a clear liquid diet (broth, tea, or water) as directed by your health care provider. Slowly move to a bland diet as tolerated. SEEK MEDICAL CARE IF:  You have unexplained abdominal pain.  You have abdominal pain associated with nausea or diarrhea.  You have pain when you urinate or have a bowel movement.  You experience abdominal pain that wakes you in the night.  You have abdominal pain that is worsened or improved by eating food.  You have abdominal pain that is worsened with eating fatty foods.  You have a fever. SEEK IMMEDIATE MEDICAL CARE IF:  Your pain does not go away within 2 hours.  You keep throwing up (vomiting).  Your pain is felt only in  portions of the abdomen, such as the right side or the left lower portion of the abdomen.  You pass bloody or black tarry stools. MAKE SURE YOU:  Understand these instructions.  Will watch your condition.  Will get help right away if you are not doing well or get worse.   This information is not intended to replace advice given to you by your health care provider. Make sure you discuss any questions you have with your health care provider.   Document Released: 12/19/2004 Document Revised: 11/30/2014 Document Reviewed: 11/18/2012 Elsevier Interactive Patient Education [employer].

## 2015-03-28 ENCOUNTER — Emergency Department: Payer: Self-pay

## 2015-03-28 ENCOUNTER — Emergency Department
Admission: EM | Admit: 2015-03-28 | Discharge: 2015-03-28 | Disposition: A | Discharge status: Home / Self Care | Payer: Self-pay | Attending: Emergency Medicine | Admitting: Emergency Medicine

## 2015-03-28 DIAGNOSIS — G43009 Migraine without aura, not intractable, without status migrainosus: Secondary | ICD-10-CM

## 2015-03-28 DIAGNOSIS — Z87891 Personal history of nicotine dependence: Secondary | ICD-10-CM | POA: Insufficient documentation

## 2015-03-28 DIAGNOSIS — E119 Type 2 diabetes mellitus without complications: Secondary | ICD-10-CM | POA: Insufficient documentation

## 2015-03-28 DIAGNOSIS — Z794 Long term (current) use of insulin: Secondary | ICD-10-CM | POA: Insufficient documentation

## 2015-03-28 DIAGNOSIS — Z79899 Other long term (current) drug therapy: Secondary | ICD-10-CM | POA: Insufficient documentation

## 2015-03-28 DIAGNOSIS — G43909 Migraine, unspecified, not intractable, without status migrainosus: Secondary | ICD-10-CM | POA: Insufficient documentation

## 2015-03-28 MED ORDER — SODIUM CHLORIDE 0.9 % IV SOLN
Freq: Once | INTRAVENOUS | Status: AC
Start: 2015-03-28 — End: 2015-03-28
  Administered 2015-03-28: 20:00:00 via INTRAVENOUS

## 2015-03-28 MED ORDER — PROCHLORPERAZINE EDISYLATE 5 MG/ML IJ SOLN
10.0000 mg | Freq: Four times a day (QID) | INTRAMUSCULAR | Status: DC | PRN
  Administered 2015-03-28: 10 mg via INTRAVENOUS
  Filled 2015-03-28: qty 2

## 2015-03-28 NOTE — ED Notes (Addendum)
Pt requested to go home sts she feels better.  Informed Dr Cinda Quest.

## 2015-03-28 NOTE — ED Notes (Signed)
Pt bib ems for migraine and uncontrollable shaking. Pt has hx of DM2

## 2015-03-28 NOTE — ED Provider Notes (Signed)
[facility] Emergency Department Provider Note  ____________________________________________  Time seen: Approximately 7:46 PM  I have reviewed the triage vital signs and the nursing notes.   HISTORY  Chief Complaint Migraine   HPI Rachael Gray is a 35 y.o. female who reports she had gradual onset of severe headache and generalized throbbing with nausea and lights make the headache worse. She had an aura just like she usually does with her regular migraines. Just that this migraine is much worse than usual. More severe than usual.She denies any fever. Patient reports she is on her menstrual period right now other right time the right amount of bleeding she says she is not pregnant   Past Medical History  Diagnosis Date  . Diabetes mellitus without complication ([place])   . Seizures ([place])   . Pseudoseizures   . Gastroparesis   . Lupus [facility])     Patient Active Problem List   Diagnosis Date Noted  . Intractable nausea and vomiting 12/10/2014  . Nausea & vomiting 12/10/2014  . Narcotic abuse 12/09/2014  . Sepsis ([place]) 12/07/2014  . Anxiety   . Pyelonephritis 12/04/2014  . Lactic acidosis 12/04/2014    Class: Acute    Past Surgical History  Procedure Laterality Date  . Appendectomy    . Cholecystectomy      Current Outpatient Rx  Name  Route  Sig  Dispense  Refill  . cephALEXin (KEFLEX) 500 MG capsule   Oral   Take 1 capsule (500 mg total) by mouth 3 (three) times daily. Patient not taking: Reported on 01/24/2015   12 capsule   0   . dicyclomine (BENTYL) 20 MG tablet   Oral   Take 1 tablet (20 mg total) by mouth 3 (three) times daily as needed for spasms.   30 tablet   0   . insulin NPH-regular Human (NOVOLIN 70/30) (70-30) 100 UNIT/ML injection   Subcutaneous   Inject 22-25 Units into the skin 2 (two) times daily with a meal. 25 units in the morning and 22 units in the evening         . levETIRAcetam (KEPPRA) 750 MG tablet  Oral   Take 1 tablet (750 mg total) by mouth 2 (two) times daily.   60 tablet   0   . oxyCODONE (OXY IR/ROXICODONE) 5 MG immediate release tablet   Oral   Take 1 tablet (5 mg total) by mouth every 4 (four) hours as needed for moderate pain or severe pain. Patient not taking: Reported on 01/24/2015   15 tablet   0   . promethazine (PHENERGAN) 25 MG tablet   Oral   Take 1 tablet (25 mg total) by mouth every 6 (six) hours as needed for nausea or vomiting.   15 tablet   0   . ranitidine (ZANTAC) 150 MG capsule   Oral   Take 1 capsule (150 mg total) by mouth 2 (two) times daily.   28 capsule   0     Allergies Doxycycline; Morphine and related; Reglan; and Tylenol  No family history on file.  Social History Social History  Substance Use Topics  . Smoking status: Former Smoker -- 2.00 packs/day    Types: Cigarettes  . Smokeless tobacco: Not on file  . Alcohol Use: Yes    Review of Systems Constitutional: No fever/chills ENT: No sore throat. Cardiovascular: Denies chest pain. Respiratory: Denies shortness of breath. Gastrointestinal: No abdominal pain.  No nausea, no vomiting.  No diarrhea.  No constipation. Genitourinary: Negative for dysuria. Musculoskeletal: Negative for back pain. Skin: Negative for rash. Neurological: Negative for  focal weakness or numbness.  10-point ROS otherwise negative.  ____________________________________________   PHYSICAL EXAM:  VITAL SIGNS: ED Triage Vitals  Enc Vitals Group     BP 03/28/15 1939 133/90 mmHg     Pulse Rate 03/28/15 1939 99     Resp 03/28/15 1939 22     Temp 03/28/15 1939 97.5 F (36.4 C)     Temp src --      SpO2 03/28/15 1939 100 %     Weight 03/28/15 1939 135 lb (61.236 kg)     Height 03/28/15 1939 5\' 5"  (1.651 m)     Head Cir --      Peak Flow --      Pain Score 03/28/15 1939 9     Pain Loc --      Pain Edu? --      Excl. in [place]? --     Constitutional: Alert and oriented. But tearful and covering  her head with the sheet. Eyes: Deferred due to photophobia Head: Atraumatic. Nose: No congestion/rhinnorhea. Mouth/Throat: Mucous membranes are moist.  Oropharynx non-erythematous. Neck: No stridor.   Cardiovascular: Normal rate, regular rhythm. Grossly normal heart sounds.  Good peripheral circulation. Respiratory: Normal respiratory effort.  No retractions. Lungs CTAB. Gastrointestinal: Soft and nontender. No distention. No abdominal bruits. No CVA tenderness. Musculoskeletal: No lower extremity tenderness nor edema.  No joint effusions. Neurologic:  Normal speech and language. No gross focal neurologic deficits are appreciated. No gait instability. Skin:  Skin is warm, dry and intact. No rash noted. Psychiatric: Mood and affect are normal. Speech and behavior are normal.  ____________________________________________   LABS (all labs ordered are listed, but only abnormal results are displayed)  Labs Reviewed - No data to display ____________________________________________  EKG   ____________________________________________  RADIOLOGY  Radiology reports and CT shows no acute disease   PROCEDURES  At 15 of 9 the patient says her headache is much better and she wants to go home. Since the head CT is negative I will let her do so  ____________________________________________   INITIAL IMPRESSION / ASSESSMENT AND PLAN / ED COURSE  Pertinent labs & imaging results that were available during my care of the patient were reviewed by me and considered in my medical decision making (see chart for details).   ____________________________________________   FINAL CLINICAL IMPRESSION(S) / ED DIAGNOSES  Final diagnoses:  Nonintractable migraine, unspecified migraine type      Nena Polio, MD 03/29/15 [phone]

## 2015-06-27 ENCOUNTER — Emergency Department
Admission: EM | Admit: 2015-06-27 | Discharge: 2015-06-27 | Disposition: A | Discharge status: Home / Self Care | Payer: Self-pay | Attending: Emergency Medicine | Admitting: Emergency Medicine

## 2015-06-27 ENCOUNTER — Emergency Department: Payer: Self-pay

## 2015-06-27 DIAGNOSIS — Z79899 Other long term (current) drug therapy: Secondary | ICD-10-CM | POA: Insufficient documentation

## 2015-06-27 DIAGNOSIS — R739 Hyperglycemia, unspecified: Secondary | ICD-10-CM

## 2015-06-27 DIAGNOSIS — Z794 Long term (current) use of insulin: Secondary | ICD-10-CM | POA: Insufficient documentation

## 2015-06-27 DIAGNOSIS — F445 Conversion disorder with seizures or convulsions: Secondary | ICD-10-CM

## 2015-06-27 DIAGNOSIS — E1165 Type 2 diabetes mellitus with hyperglycemia: Secondary | ICD-10-CM | POA: Insufficient documentation

## 2015-06-27 DIAGNOSIS — F1110 Opioid abuse, uncomplicated: Secondary | ICD-10-CM | POA: Insufficient documentation

## 2015-06-27 DIAGNOSIS — A419 Sepsis, unspecified organism: Secondary | ICD-10-CM | POA: Insufficient documentation

## 2015-06-27 DIAGNOSIS — Z792 Long term (current) use of antibiotics: Secondary | ICD-10-CM | POA: Insufficient documentation

## 2015-06-27 DIAGNOSIS — M329 Systemic lupus erythematosus, unspecified: Secondary | ICD-10-CM | POA: Insufficient documentation

## 2015-06-27 DIAGNOSIS — F17210 Nicotine dependence, cigarettes, uncomplicated: Secondary | ICD-10-CM | POA: Insufficient documentation

## 2015-06-27 DIAGNOSIS — Z760 Encounter for issue of repeat prescription: Secondary | ICD-10-CM

## 2015-06-27 DIAGNOSIS — E860 Dehydration: Secondary | ICD-10-CM

## 2015-06-27 LAB — URINALYSIS COMPLETE WITH MICROSCOPIC (ARMC ONLY)
BACTERIA UA: NONE SEEN
Bilirubin Urine: NEGATIVE
Ketones, ur: NEGATIVE mg/dL
NITRITE: NEGATIVE
PROTEIN: NEGATIVE mg/dL
SPECIFIC GRAVITY, URINE: 1.035 — AB (ref 1.005–1.030)
pH: 8 (ref 5.0–8.0)

## 2015-06-27 LAB — COMPREHENSIVE METABOLIC PANEL
ALBUMIN: 4.3 g/dL (ref 3.5–5.0)
ALK PHOS: 51 U/L (ref 38–126)
ALT: 8 U/L — AB (ref 14–54)
AST: 21 U/L (ref 15–41)
Anion gap: 11 (ref 5–15)
BILIRUBIN TOTAL: 0.9 mg/dL (ref 0.3–1.2)
BUN: 6 mg/dL (ref 6–20)
CALCIUM: 9.3 mg/dL (ref 8.9–10.3)
CO2: 22 mmol/L (ref 22–32)
Chloride: 104 mmol/L (ref 101–111)
Creatinine, Ser: 0.56 mg/dL (ref 0.44–1.00)
GFR calc Af Amer: >60 mL/min (ref >60)
GFR calc non Af Amer: >60 mL/min (ref >60)
GLUCOSE: 303 mg/dL — AB (ref 65–99)
Potassium: 4 mmol/L (ref 3.5–5.1)
SODIUM: 137 mmol/L (ref 135–145)
TOTAL PROTEIN: 8.2 g/dL — AB (ref 6.5–8.1)

## 2015-06-27 LAB — CBC WITH DIFFERENTIAL/PLATELET
Basophils Absolute: 0.1 10*3/uL (ref 0–0.1)
EOS ABS: 0.1 10*3/uL (ref 0–0.7)
Eosinophils Relative: 1 %
HEMATOCRIT: 39.7 % (ref 35.0–47.0)
HEMOGLOBIN: 13.3 g/dL (ref 12.0–16.0)
LYMPHS ABS: 2.5 10*3/uL (ref 1.0–3.6)
Lymphocytes Relative: 20 %
MCH: 29.1 pg (ref 26.0–34.0)
MCHC: 33.5 g/dL (ref 32.0–36.0)
MCV: 86.9 fL (ref 80.0–100.0)
MONO ABS: 0.7 10*3/uL (ref 0.2–0.9)
Neutro Abs: 9.2 10*3/uL — ABNORMAL HIGH (ref 1.4–6.5)
Neutrophils Relative %: 72 %
Platelets: 259 10*3/uL (ref 150–440)
RBC: 4.56 MIL/uL (ref 3.80–5.20)
RDW: 13.1 % (ref 11.5–14.5)
WBC: 12.5 10*3/uL — ABNORMAL HIGH (ref 3.6–11.0)

## 2015-06-27 LAB — GLUCOSE, CAPILLARY: GLUCOSE-CAPILLARY: 299 mg/dL — AB (ref 65–99)

## 2015-06-27 LAB — LIPASE, BLOOD: Lipase: 21 U/L (ref 11–51)

## 2015-06-27 LAB — POCT PREGNANCY, URINE: Preg Test, Ur: NEGATIVE

## 2015-06-27 MED ORDER — SODIUM CHLORIDE 0.9 % IV BOLUS (SEPSIS)
1000.0000 mL | Freq: Once | INTRAVENOUS | Status: AC
  Administered 2015-06-27: 1000 mL via INTRAVENOUS

## 2015-06-27 MED ORDER — DIPHENHYDRAMINE HCL 50 MG/ML IJ SOLN
25.0000 mg | Freq: Once | INTRAMUSCULAR | Status: AC
  Administered 2015-06-27: 25 mg via INTRAVENOUS
  Filled 2015-06-27: qty 1

## 2015-06-27 MED ORDER — LEVETIRACETAM 750 MG PO TABS: 750.0000 mg | ORAL_TABLET | Freq: Two times a day (BID) | ORAL | Status: DC

## 2015-06-27 MED ORDER — ONDANSETRON HCL 4 MG/2ML IJ SOLN
4.0000 mg | Freq: Once | INTRAMUSCULAR | Status: AC
  Administered 2015-06-27: 4 mg via INTRAVENOUS
  Filled 2015-06-27: qty 2

## 2015-06-27 MED ORDER — PROMETHAZINE HCL 25 MG PO TABS: 25.0000 mg | ORAL_TABLET | Freq: Four times a day (QID) | ORAL | Status: DC | PRN

## 2015-06-27 MED ORDER — LEVETIRACETAM 500 MG/5ML IV SOLN
1500.0000 mg | Freq: Once | INTRAVENOUS | Status: AC
  Administered 2015-06-27: 1500 mg via INTRAVENOUS
  Filled 2015-06-27: qty 15

## 2015-06-27 NOTE — Discharge Instructions (Signed)
Dehydration, Adult Dehydration is a condition in which you do not have enough fluid or water in your body. It happens when you take in less fluid than you lose. Vital organs such as the kidneys, brain, and heart cannot function without a proper amount of fluids. Any loss of fluids from the body can cause dehydration.  Dehydration can range from mild to severe. This condition should be treated right away to help prevent it from becoming severe. CAUSES  This condition may be caused by:  Vomiting.  Diarrhea.  Excessive sweating, such as when exercising in hot or humid weather.  Not drinking enough fluid during strenuous exercise or during an illness.  Excessive urine output.  Fever.  Certain medicines. RISK FACTORS This condition is more likely to develop in:  People who are taking certain medicines that cause the body to lose excess fluid (diuretics).   People who have a chronic illness, such as diabetes, that may increase urination.  Older adults.   People who live at high altitudes.   People who participate in endurance sports.  SYMPTOMS  Mild Dehydration  Thirst.  Dry lips.  Slightly dry mouth.  Dry, warm skin. Moderate Dehydration  Very dry mouth.   Muscle cramps.   Dark urine and decreased urine production.   Decreased tear production.   Headache.   Light-headedness, especially when you stand up from a sitting position.  Severe Dehydration  Changes in skin.   Cold and clammy skin.   Skin does not spring back quickly when lightly pinched and released.   Changes in body fluids.   Extreme thirst.   No tears.   Not able to sweat when body temperature is high, such as in hot weather.   Minimal urine production.   Changes in vital signs.   Rapid, weak pulse (more than 100 beats per minute when you are sitting still).   Rapid breathing.   Low blood pressure.   Other changes.   Sunken eyes.   Cold hands and feet.    Confusion.  Lethargy and difficulty being awakened.  Fainting (syncope).   Short-term weight loss.   Unconsciousness. DIAGNOSIS  This condition may be diagnosed based on your symptoms. You may also have tests to determine how severe your dehydration is. These tests may include:   Urine tests.   Blood tests.  TREATMENT  Treatment for this condition depends on the severity. Mild or moderate dehydration can often be treated at home. Treatment should be started right away. Do not wait until dehydration becomes severe. Severe dehydration needs to be treated at the hospital. Treatment for Mild Dehydration  Drinking plenty of water to replace the fluid you have lost.   Replacing minerals in your blood (electrolytes) that you may have lost.  Treatment for Moderate Dehydration  Consuming oral rehydration solution (ORS). Treatment for Severe Dehydration  Receiving fluid through an IV tube.   Receiving electrolyte solution through a feeding tube that is passed through your nose and into your stomach (nasogastric tube or NG tube).  Correcting any abnormalities in electrolytes. HOME CARE INSTRUCTIONS   Drink enough fluid to keep your urine clear or pale yellow.   Drink water or fluid slowly by taking small sips. You can also try sucking on ice cubes.  Have food or beverages that contain electrolytes. Examples include bananas and sports drinks.  Take over-the-counter and prescription medicines only as told by your health care provider.   Prepare ORS according to the manufacturer's instructions. Take sips  of ORS every 5 minutes until your urine returns to normal.  If you have vomiting or diarrhea, continue to try to drink water, ORS, or both.   If you have diarrhea, avoid:   Beverages that contain caffeine.   Fruit juice.   Milk.   Carbonated soft drinks.  Do not take salt tablets. This can lead to the condition of having too much sodium in your body  (hypernatremia).  SEEK MEDICAL CARE IF:  You cannot eat or drink without vomiting.  You have had moderate diarrhea during a period of more than 24 hours.  You have a fever. SEEK IMMEDIATE MEDICAL CARE IF:   You have extreme thirst.  You have severe diarrhea.  You have not urinated in 6-8 hours, or you have urinated only a small amount of very dark urine.  You have shriveled skin.  You are dizzy, confused, or both.   This information is not intended to replace advice given to you by your health care provider. Make sure you discuss any questions you have with your health care provider.   Document Released: 03/11/2005 Document Revised: 11/30/2014 Document Reviewed: 07/27/2014 Elsevier Interactive Patient Education 2016 [place].  Hyperglycemia Hyperglycemia occurs when the glucose (sugar) in your blood is too high. Hyperglycemia can happen for many reasons, but it most often happens to people who do not know they have diabetes or are not managing their diabetes properly.  CAUSES  Whether you have diabetes or not, there are other causes of hyperglycemia. Hyperglycemia can occur when you have diabetes, but it can also occur in other situations that you might not be as aware of, such as: Diabetes  If you have diabetes and are having problems controlling your blood glucose, hyperglycemia could occur because of some of the following reasons:  Not following your meal plan.  Not taking your diabetes medications or not taking it properly.  Exercising less or doing less activity than you normally do.  Being sick. Pre-diabetes  This cannot be ignored. Before people develop Type 2 diabetes, they almost always have "pre-diabetes." This is when your blood glucose levels are higher than normal, but not yet high enough to be diagnosed as diabetes. Research has shown that some long-term damage to the body, especially the heart and circulatory system, may already be occurring during  pre-diabetes. If you take action to manage your blood glucose when you have pre-diabetes, you may delay or prevent Type 2 diabetes from developing. Stress  If you have diabetes, you may be "diet" controlled or on oral medications or insulin to control your diabetes. However, you may find that your blood glucose is higher than usual in the hospital whether you have diabetes or not. This is often referred to as "stress hyperglycemia." Stress can elevate your blood glucose. This happens because of hormones put out by the body during times of stress. If stress has been the cause of your high blood glucose, it can be followed regularly by your caregiver. That way he/she can make sure your hyperglycemia does not continue to get worse or progress to diabetes. Steroids  Steroids are medications that act on the infection fighting system (immune system) to block inflammation or infection. One side effect can be a rise in blood glucose. Most people can produce enough extra insulin to allow for this rise, but for those who cannot, steroids make blood glucose levels go even higher. It is not unusual for steroid treatments to "uncover" diabetes that is developing. It is not  always possible to determine if the hyperglycemia will go away after the steroids are stopped. A special blood test called an A1c is sometimes done to determine if your blood glucose was elevated before the steroids were started. SYMPTOMS  Thirsty.  Frequent urination.  Dry mouth.  Blurred vision.  Tired or fatigue.  Weakness.  Sleepy.  Tingling in feet or leg. DIAGNOSIS  Diagnosis is made by monitoring blood glucose in one or all of the following ways:  A1c test. This is a chemical found in your blood.  Fingerstick blood glucose monitoring.  Laboratory results. TREATMENT  First, knowing the cause of the hyperglycemia is important before the hyperglycemia can be treated. Treatment may include, but is not be limited  to:  Education.  Change or adjustment in medications.  Change or adjustment in meal plan.  Treatment for an illness, infection, etc.  More frequent blood glucose monitoring.  Change in exercise plan.  Decreasing or stopping steroids.  Lifestyle changes. HOME CARE INSTRUCTIONS   Test your blood glucose as directed.  Exercise regularly. Your caregiver will give you instructions about exercise. Pre-diabetes or diabetes which comes on with stress is helped by exercising.  Eat wholesome, balanced meals. Eat often and at regular, fixed times. Your caregiver or nutritionist will give you a meal plan to guide your sugar intake.  Being at an ideal weight is important. If needed, losing as little as 10 to 15 pounds may help improve blood glucose levels. SEEK MEDICAL CARE IF:   You have questions about medicine, activity, or diet.  You continue to have symptoms (problems such as increased thirst, urination, or weight gain). SEEK IMMEDIATE MEDICAL CARE IF:   You are vomiting or have diarrhea.  Your breath smells fruity.  You are breathing faster or slower.  You are very sleepy or incoherent.  You have numbness, tingling, or pain in your feet or hands.  You have chest pain.  Your symptoms get worse even though you have been following your caregiver's orders.  If you have any other questions or concerns.   This information is not intended to replace advice given to you by your health care provider. Make sure you discuss any questions you have with your health care provider.   Document Released: 09/04/2000 Document Revised: 06/03/2011 Document Reviewed: 11/15/2014 Elsevier Interactive Patient Education 2016 [employer].  Nonepileptic Seizures Nonepileptic seizures are seizures that are not caused by abnormal electrical signals in your brain. These seizures often seem like epileptic seizures, but they are not caused by epilepsy.  There are two types of nonepileptic  seizures:  A physiologic nonepileptic seizure results from a disruption in your brain.  A psychogenic seizure results from emotional stress. These seizures are sometimes called pseudoseizures. CAUSES  Causes of physiologic nonepileptic seizures include:   Sudden drop in blood pressure.  Low blood sugar.  Low levels of salt (sodium) in your blood.  Low levels of calcium in your blood.  Migraine.  Heart rhythm problems.  Sleep disorders.  Drug and alcohol abuse. Common causes of psychogenic nonepileptic seizures include:  Stress.  Emotional trauma.  Sexual or physical abuse.  Major life events, such as divorce or the death of a loved one.  Mental health disorders, including panic attack and hyperactivity disorder. SIGNS AND SYMPTOMS A nonepileptic seizure can look like an epileptic seizure, including uncontrollable shaking (convulsions), or changes in attention, behavior, or the ability to remain awake and alert. However, there are some differences. Nonepileptic seizures usually:  Do  not cause physical injuries.  Start slowly.  Include crying or shrieking.  Last longer than 2 minutes.  Have a short recovery time without headache or exhaustion. DIAGNOSIS  Your health care provider can usually diagnose nonepileptic seizures after taking your medical history and giving you a physical exam. Your health care provider may want to talk to your friends or relatives who have seen you have a seizure.  You may also need to have tests to look for causes of physiologic nonepileptic seizures. This may include an electroencephalogram (EEG), which is a test that measures electrical activity in your brain. If you have had an epileptic seizure, the results of your EEG will be abnormal. If your health care provider thinks you have had a psychogenic nonepileptic seizure, you may need to see a mental health specialist for an evaluation. TREATMENT  Treatment depends on the type and cause of  your seizures.  For physiologic nonepileptic seizures, treatment is aimed at addressing the underlying condition that caused the seizures. These seizures usually stop when the underlying condition is properly treated.  Nonepileptic seizures do not respond to the seizure medicines used to treat epilepsy.  For psychogenic seizures, you may need to work with a mental health specialist. [place] care will depend on the type of nonepileptic seizures you have.   Follow all your health care provider's instructions.  Keep all your follow-up appointments. SEEK MEDICAL CARE IF: You continue to have seizures after treatment. SEEK IMMEDIATE MEDICAL CARE IF:  Your seizures change or become more frequent.  You injure yourself during a seizure.  You have one seizure after another.  You have trouble recovering from a seizure.  You have chest pain or trouble breathing. MAKE SURE YOU:  Understand these instructions.  Will watch your condition.  Will get help right away if you are not doing well or get worse.   This information is not intended to replace advice given to you by your health care provider. Make sure you discuss any questions you have with your health care provider.   Document Released: 04/26/2005 Document Revised: 04/01/2014 Document Reviewed: 01/05/2013 Elsevier Interactive Patient Education [employer].

## 2015-06-27 NOTE — ED Notes (Signed)
Per EMS patient comes from home after an epileptic seizure. Patient was at home with her kids who report she started seizing around 1440 today. Hx of epilepsy and diabetes. Patient normally takes Kepra for seizures but has been off her medications for 1 day. Patient will respond to voice. Patient is answering questions in triage. Lethargic. Able to move all extremities on command.

## 2015-06-27 NOTE — ED Notes (Signed)
Pharmacy called and notified of need of Keppra. States they will send it up.

## 2015-06-27 NOTE — ED Provider Notes (Signed)
[facility] Emergency Department Provider Note  ____________________________________________  Time seen: 5:00 PM on arrival by EMS  I have reviewed the triage vital signs and the nursing notes.   HISTORY  Chief Complaint Seizures    HPI Rachael Gray is a 35 y.o. female brought to the ED by EMS due to seizure-like activity at home for about 2 hours. EMS reports that the patient's mother told them that the patient does this all the time and it is not a big deal. EMS gave a total of 10 mg of Versed en route with the convulsive activity coming and going. There per the patient was intermittently alert does well in between seizure-like activity. Most recent seizure started just a few minutes ago just prior to arrival in the emergency department treatment room.  When I approach the patient and began asking her questions, convulsive activity stopped and she answered appropriately. No recent head trauma.LMP ended 3 days ago. He also reports some generalized abdominal pain with nausea and states that she has a history of gastroparesis. She also reports that she ran out of her Keppra yesterday. Denies dysuria frequency urgency or pregnancy. No recent drug use.     Past Medical History  Diagnosis Date  . Diabetes mellitus without complication ([place])   . Seizures ([place])   . Pseudoseizures   . Gastroparesis   . Lupus [facility])      Patient Active Problem List   Diagnosis Date Noted  . Intractable nausea and vomiting 12/10/2014  . Nausea & vomiting 12/10/2014  . Narcotic abuse 12/09/2014  . Sepsis ([place]) 12/07/2014  . Anxiety   . Pyelonephritis 12/04/2014  . Lactic acidosis 12/04/2014    Class: Acute     Past Surgical History  Procedure Laterality Date  . Appendectomy    . Cholecystectomy       Current Outpatient Rx  Name  Route  Sig  Dispense  Refill  . cephALEXin (KEFLEX) 500 MG capsule   Oral   Take 1 capsule (500 mg total) by mouth 3 (three) times  daily. Patient not taking: Reported on 01/24/2015   12 capsule   0   . dicyclomine (BENTYL) 20 MG tablet   Oral   Take 1 tablet (20 mg total) by mouth 3 (three) times daily as needed for spasms.   30 tablet   0   . insulin NPH-regular Human (NOVOLIN 70/30) (70-30) 100 UNIT/ML injection   Subcutaneous   Inject 22-25 Units into the skin 2 (two) times daily with a meal. 25 units in the morning and 22 units in the evening         . levETIRAcetam (KEPPRA) 750 MG tablet   Oral   Take 1 tablet (750 mg total) by mouth 2 (two) times daily.   60 tablet   0   . oxyCODONE (OXY IR/ROXICODONE) 5 MG immediate release tablet   Oral   Take 1 tablet (5 mg total) by mouth every 4 (four) hours as needed for moderate pain or severe pain. Patient not taking: Reported on 01/24/2015   15 tablet   0   . promethazine (PHENERGAN) 25 MG tablet   Oral   Take 1 tablet (25 mg total) by mouth every 6 (six) hours as needed for nausea or vomiting.   15 tablet   0   . ranitidine (ZANTAC) 150 MG capsule   Oral   Take 1 capsule (150 mg total) by mouth 2 (two) times daily.   [place]  capsule   0      Allergies Doxycycline; Morphine and related; Reglan; and Tylenol   No family history on file.  Social History Social History  Substance Use Topics  . Smoking status: Former Smoker -- 2.00 packs/day    Types: Cigarettes  . Smokeless tobacco: Not on file  . Alcohol Use: Yes    Review of Systems  Constitutional:   No fever or chills. No weight changes Eyes:   No vision changes.  ENT:   No sore throat. No rhinorrhea. Cardiovascular:   No chest pain. Respiratory:   No dyspnea or cough. Gastrointestinal:   Positive generalized abdominal pain. No vomiting or diarrhea..  No BRBPR or melena. Genitourinary:   Negative for dysuria or difficulty urinating. Musculoskeletal:   Negative for focal pain or swelling Skin:   Negative for rash. Neurological:   Negative for headaches, focal weakness or  numbness.  10-point ROS otherwise negative.  ____________________________________________   PHYSICAL EXAM:  VITAL SIGNS: ED Triage Vitals  Enc Vitals Group     BP 06/27/15 1730 120/77 mmHg     Pulse Rate 06/27/15 1709 99     Resp 06/27/15 1709 17     Temp 06/27/15 1709 98 F (36.7 C)     Temp Source 06/27/15 1709 Oral     SpO2 06/27/15 1709 99 %     Weight 06/27/15 1709 134 lb (60.782 kg)     Height 06/27/15 1709 5\' 5"  (1.651 m)     Head Cir --      Peak Flow --      Pain Score 06/27/15 1710 9     Pain Loc --      Pain Edu? --      Excl. in [place]? --     Vital signs reviewed, nursing assessments reviewed. On initial arrival, patient is turning head side to side with generalized shaking activity. On approaching the patient and beginning to ask questions, all of this activity stops and she answers appropriately right away. And then proceeded with physical exam.  Constitutional:   Alert and oriented. Well appearing and in no distress. Eyes:   No scleral icterus. No conjunctival pallor. PERRL at 2-3 mm, symmetric. EOMI without nystagmus ENT   Head:   Normocephalic and atraumatic.   Nose:   No congestion/rhinnorhea. No septal hematoma   Mouth/Throat:   MMM, no pharyngeal erythema. No peritonsillar mass.    Neck:   No stridor. No SubQ emphysema. No meningismus. Hematological/Lymphatic/Immunilogical:   No cervical lymphadenopathy. Cardiovascular:   RRR. Symmetric bilateral radial and DP pulses.  No murmurs.  Respiratory:   Normal respiratory effort without tachypnea nor retractions. Breath sounds are clear and equal bilaterally. No wheezes/rales/rhonchi. Gastrointestinal:   Soft and nontender. Non distended. There is no CVA tenderness.  No rebound, rigidity, or guarding. Genitourinary:   deferred Musculoskeletal:   Nontender with normal range of motion in all extremities. No joint effusions.  No lower extremity tenderness.  No edema. Neurologic:   Normal speech and  language.  CN 2-10 normal. Motor grossly intact. No gross focal neurologic deficits are appreciated.  Skin:    Skin is warm, dry and intact. No rash noted.  No petechiae, purpura, or bullae.  ____________________________________________    LABS (pertinent positives/negatives) (all labs ordered are listed, but only abnormal results are displayed) Labs Reviewed  COMPREHENSIVE METABOLIC PANEL - Abnormal; Notable for the following:    Glucose, Bld 303 (*)    Total Protein 8.2 (*)  ALT 8 (*)    All other components within normal limits  CBC WITH DIFFERENTIAL/PLATELET - Abnormal; Notable for the following:    WBC 12.5 (*)    Neutro Abs 9.2 (*)    All other components within normal limits  URINALYSIS COMPLETEWITH MICROSCOPIC ([facility] ONLY) - Abnormal; Notable for the following:    Color, Urine YELLOW (*)    APPearance CLEAR (*)    Glucose, UA >500 (*)    Specific Gravity, Urine 1.035 (*)    Hgb urine dipstick 1+ (*)    Leukocytes, UA TRACE (*)    Squamous Epithelial / LPF 0-5 (*)    All other components within normal limits  GLUCOSE, CAPILLARY - Abnormal; Notable for the following:    Glucose-Capillary 299 (*)    All other components within normal limits  LIPASE, BLOOD  POC URINE PREG, ED  POCT PREGNANCY, URINE   ____________________________________________   EKG  Interpreted by me Sinus tachycardia rate 102, normal axis intervals QRS ST segments and T waves.  ____________________________________________    RADIOLOGY  Chest x-ray unremarkable  ____________________________________________   PROCEDURES   ____________________________________________   INITIAL IMPRESSION / ASSESSMENT AND PLAN / ED COURSE  Pertinent labs & imaging results that were available during my care of the patient were reviewed by me and considered in my medical decision making (see chart for details).  Patient presents with a generalized convulsive activity which appears to be  non-epileptogenic seizure. She has a history of same. She is also run out of her Keppra which may be contributing to this. The symptoms spontaneously resolved on arrival when I started asking the patient questions about how she is feeling. She remained without any recurrence of symptoms in the emergency department. Since of workup unremarkable. Patient was given IV fluids and IV Keppra load. No evidence of UTI pneumonia meningitis encephalitis or trauma. We'll continue her on Keppra which I have refilled for her. Phenergan for gastroparesis as needed. Patient is tolerating oral intake in the emergency department. Medically stable. Presentation not consistent with status epilepticus.     ____________________________________________   FINAL CLINICAL IMPRESSION(S) / ED DIAGNOSES  Final diagnoses:  Hyperglycemia  Mild dehydration  Medication refill  Pseudoseizure [facility])      Carrie Mew, MD 06/27/15 2001

## 2015-08-21 ENCOUNTER — Encounter: Payer: Self-pay | Admitting: *Deleted

## 2015-08-21 ENCOUNTER — Emergency Department
Admission: EM | Admit: 2015-08-21 | Discharge: 2015-08-21 | Disposition: A | Discharge status: Home / Self Care | Payer: Self-pay | Attending: Emergency Medicine | Admitting: Emergency Medicine

## 2015-08-21 DIAGNOSIS — F419 Anxiety disorder, unspecified: Secondary | ICD-10-CM | POA: Insufficient documentation

## 2015-08-21 DIAGNOSIS — Z87891 Personal history of nicotine dependence: Secondary | ICD-10-CM | POA: Insufficient documentation

## 2015-08-21 DIAGNOSIS — G40509 Epileptic seizures related to external causes, not intractable, without status epilepticus: Secondary | ICD-10-CM

## 2015-08-21 DIAGNOSIS — E119 Type 2 diabetes mellitus without complications: Secondary | ICD-10-CM | POA: Insufficient documentation

## 2015-08-21 DIAGNOSIS — Z794 Long term (current) use of insulin: Secondary | ICD-10-CM | POA: Insufficient documentation

## 2015-08-21 DIAGNOSIS — G40909 Epilepsy, unspecified, not intractable, without status epilepticus: Secondary | ICD-10-CM | POA: Insufficient documentation

## 2015-08-21 DIAGNOSIS — Z79899 Other long term (current) drug therapy: Secondary | ICD-10-CM | POA: Insufficient documentation

## 2015-08-21 LAB — CBC WITH DIFFERENTIAL/PLATELET
Basophils Absolute: 0.1 10*3/uL (ref 0–0.1)
Basophils Relative: 1 %
EOS ABS: 0.2 10*3/uL (ref 0–0.7)
Eosinophils Relative: 3 %
HEMATOCRIT: 38.8 % (ref 35.0–47.0)
HEMOGLOBIN: 13.2 g/dL (ref 12.0–16.0)
LYMPHS ABS: 4 10*3/uL — AB (ref 1.0–3.6)
Lymphocytes Relative: 50 %
MCH: 28.7 pg (ref 26.0–34.0)
MCHC: 34 g/dL (ref 32.0–36.0)
MCV: 84.2 fL (ref 80.0–100.0)
MONOS PCT: 7 %
Monocytes Absolute: 0.6 10*3/uL (ref 0.2–0.9)
NEUTROS ABS: 3 10*3/uL (ref 1.4–6.5)
NEUTROS PCT: 39 %
Platelets: 176 10*3/uL (ref 150–440)
RBC: 4.61 MIL/uL (ref 3.80–5.20)
RDW: 12.8 % (ref 11.5–14.5)
WBC: 7.8 10*3/uL (ref 3.6–11.0)

## 2015-08-21 LAB — COMPREHENSIVE METABOLIC PANEL
ALK PHOS: 40 U/L (ref 38–126)
ALT: 8 U/L — ABNORMAL LOW (ref 14–54)
ANION GAP: 10 (ref 5–15)
AST: 13 U/L — ABNORMAL LOW (ref 15–41)
Albumin: 4 g/dL (ref 3.5–5.0)
BILIRUBIN TOTAL: 0.6 mg/dL (ref 0.3–1.2)
BUN: 12 mg/dL (ref 6–20)
CALCIUM: 9 mg/dL (ref 8.9–10.3)
CO2: 24 mmol/L (ref 22–32)
Chloride: 103 mmol/L (ref 101–111)
Creatinine, Ser: 0.58 mg/dL (ref 0.44–1.00)
Glucose, Bld: 235 mg/dL — ABNORMAL HIGH (ref 65–99)
Potassium: 3.3 mmol/L — ABNORMAL LOW (ref 3.5–5.1)
Sodium: 137 mmol/L (ref 135–145)
TOTAL PROTEIN: 7.5 g/dL (ref 6.5–8.1)

## 2015-08-21 LAB — BLOOD GAS, ARTERIAL
ACID-BASE DEFICIT: 0.8 mmol/L (ref 0.0–2.0)
BICARBONATE: 23.5 meq/L (ref 21.0–28.0)
FIO2: 0.21
O2 Saturation: 99.3 %
PATIENT TEMPERATURE: 37
PH ART: 7.41 (ref 7.350–7.450)
pCO2 arterial: 37 mmHg (ref 32.0–48.0)
pO2, Arterial: 145 mmHg — ABNORMAL HIGH (ref 83.0–108.0)

## 2015-08-21 LAB — ETHANOL: Alcohol, Ethyl (B): <5 mg/dL (ref <5)

## 2015-08-21 LAB — ACETAMINOPHEN LEVEL

## 2015-08-21 LAB — SALICYLATE LEVEL

## 2015-08-21 MED ORDER — ONDANSETRON HCL 4 MG/2ML IJ SOLN
4.0000 mg | Freq: Once | INTRAMUSCULAR | Status: AC | PRN
  Administered 2015-08-21: 4 mg via INTRAVENOUS

## 2015-08-21 MED ORDER — SODIUM CHLORIDE 0.9 % IV SOLN
1000.0000 mg | Freq: Once | INTRAVENOUS | Status: AC
  Administered 2015-08-21: 1000 mg via INTRAVENOUS
  Filled 2015-08-21: qty 10

## 2015-08-21 MED ORDER — ONDANSETRON HCL 4 MG/2ML IJ SOLN
INTRAMUSCULAR | Status: AC
  Administered 2015-08-21: 4 mg via INTRAVENOUS
  Filled 2015-08-21: qty 2

## 2015-08-21 MED ORDER — LEVETIRACETAM 750 MG PO TABS: 750.0000 mg | ORAL_TABLET | Freq: Two times a day (BID) | ORAL | Status: DC

## 2015-08-21 MED ORDER — SODIUM CHLORIDE 0.9 % IV BOLUS (SEPSIS)
1000.0000 mL | Freq: Once | INTRAVENOUS | Status: AC
  Administered 2015-08-21: 1000 mL via INTRAVENOUS

## 2015-08-21 NOTE — ED Notes (Signed)
Pt arrived to ED via EMS from home after onset of seizures 1 hour ago. Pt has hx of similar events. EMS reports a total of 13 grand mal seizures lasting 1-2 minutes each. Pt is reported to be responding in between seizures but is emotional. 4mg  Versed administered in route via EMS. Pt is responding to MD upon arrival and able to open eyes on command. Pts legs are shaking on and off throughout triage.

## 2015-08-21 NOTE — ED Provider Notes (Signed)
[facility] Emergency Department Provider Note   ____________________________________________  Time seen: Approximately 1:49 AM  I have reviewed the triage vital signs and the nursing notes.   HISTORY  Chief Complaint Seizures    HPI Rachael Gray is a 35 y.o. female presents to the ED from home via EMS for chief complaint of seizure. Patient is well-known to the emergency department; has a history of pseudoseizure, diabetes and gastroparesis. EMS reports family state patient has had increased stress recently. EMS reports a total of 13 grand mal seizures lasting 1-2 minutes each with no postictal period and patient responded appropriately in between seizures. Patient is emotional and tearful. 4 mg IV Versed administered via EMS en route.Patient denies recent fever, chills, chest pain, shortness of breath, chest pain, nausea, vomiting, abdominal pain, diarrhea. Denies recent travel or trauma. Nothing makes her symptoms better or worse.   Past Medical History  Diagnosis Date  . Diabetes mellitus without complication ([place])   . Seizures ([place])   . Pseudoseizures   . Gastroparesis   . Lupus [facility])     Patient Active Problem List   Diagnosis Date Noted  . Intractable nausea and vomiting 12/10/2014  . Nausea & vomiting 12/10/2014  . Narcotic abuse 12/09/2014  . Sepsis ([place]) 12/07/2014  . Anxiety   . Pyelonephritis 12/04/2014  . Lactic acidosis 12/04/2014    Class: Acute    Past Surgical History  Procedure Laterality Date  . Appendectomy    . Cholecystectomy      Current Outpatient Rx  Name  Route  Sig  Dispense  Refill  . dicyclomine (BENTYL) 20 MG tablet   Oral   Take 1 tablet (20 mg total) by mouth 3 (three) times daily as needed for spasms.   30 tablet   0   . insulin NPH-regular Human (NOVOLIN 70/30) (70-30) 100 UNIT/ML injection   Subcutaneous   Inject 22-25 Units into the skin 2 (two) times daily with a meal. 25 units in the  morning and 22 units in the evening         . promethazine (PHENERGAN) 25 MG tablet   Oral   Take 1 tablet (25 mg total) by mouth every 6 (six) hours as needed for nausea or vomiting.   15 tablet   0   . levETIRAcetam (KEPPRA) 750 MG tablet   Oral   Take 1 tablet (750 mg total) by mouth 2 (two) times daily.   60 tablet   0     Allergies Doxycycline; Morphine and related; Reglan; and Tylenol  History reviewed. No pertinent family history.  Social History Social History  Substance Use Topics  . Smoking status: Former Smoker -- 2.00 packs/day    Types: Cigarettes  . Smokeless tobacco: None  . Alcohol Use: Yes    Review of Systems  Constitutional: No fever/chills. Eyes: No visual changes. ENT: No sore throat. Cardiovascular: Denies chest pain. Respiratory: Denies shortness of breath. Gastrointestinal: No abdominal pain.  No nausea, no vomiting.  No diarrhea.  No constipation. Genitourinary: Negative for dysuria. Musculoskeletal: Negative for back pain. Skin: Negative for rash. Neurological: Positive for seizure-like activity. Negative for headaches, focal weakness or numbness. Psychiatric:Positive for increased stress and anxiety.  10-point ROS otherwise negative.  ____________________________________________   PHYSICAL EXAM:  VITAL SIGNS: ED Triage Vitals  Enc Vitals Group     BP --      Pulse --      Resp --  Temp --      Temp src --      SpO2 --      Weight --      Height --      Head Cir --      Peak Flow --      Pain Score --      Pain Loc --      Pain Edu? --      Excl. in [place]? --     Constitutional: Alert and oriented. Well appearing and in no acute distress. Eyes: Conjunctivae are bloodshot bilaterally. PERRL. EOMI. Head: Atraumatic. Nose: No congestion/rhinnorhea. Mouth/Throat: Mucous membranes are moist.  Oropharynx non-erythematous. Neck: No stridor.  No cervical spine tenderness to palpation. Cardiovascular: Normal rate,  regular rhythm. Grossly normal heart sounds.  Good peripheral circulation. Respiratory: Normal respiratory effort.  No retractions. Lungs CTAB. Gastrointestinal: Soft and nontender. No distention. No abdominal bruits. No CVA tenderness. Musculoskeletal: No lower extremity tenderness nor edema.  No joint effusions. Neurologic:  Seizure-like activity with shaking of all extremities. Shaking stops to voice. Patient opens eyes spontaneously to voice. When her right arm is lifted above her head, patient keeps her arm up and does not let it drop onto her face. Patient is immediately alert and oriented 3 and answers questions appropriately. When she is left alone she immediately resumes shaking of all extremities. Again when questioned, patient stops shaking activity and answers questions appropriately. Normal speech and language. No gross focal neurologic deficits are appreciated.  Skin:  Skin is warm, dry and intact. No rash noted. Psychiatric: Mood and affect are anxious. Speech and behavior are normal.  ____________________________________________   LABS (all labs ordered are listed, but only abnormal results are displayed)  Labs Reviewed  CBC WITH DIFFERENTIAL/PLATELET - Abnormal; Notable for the following:    Lymphs Abs 4.0 (*)    All other components within normal limits  COMPREHENSIVE METABOLIC PANEL - Abnormal; Notable for the following:    Potassium 3.3 (*)    Glucose, Bld 235 (*)    AST 13 (*)    ALT 8 (*)    All other components within normal limits  ACETAMINOPHEN LEVEL - Abnormal; Notable for the following:    Acetaminophen (Tylenol), Serum <10 (*)    All other components within normal limits  BLOOD GAS, ARTERIAL - Abnormal; Notable for the following:    pO2, Arterial 145 (*)    All other components within normal limits  ETHANOL  SALICYLATE LEVEL  URINALYSIS COMPLETEWITH MICROSCOPIC ([facility] ONLY)  URINE DRUG SCREEN, QUALITATIVE ([facility] ONLY)  POC URINE PREG, ED    ____________________________________________  EKG  ED ECG REPORT I, Kaijah Abts J, the attending physician, personally viewed and interpreted this ECG.   Date: 08/21/2015  EKG Time: 0152  Rate: 96  Rhythm: normal EKG, normal sinus rhythm  Axis: Normal  Intervals:none  ST&T Change: Nonspecific  ____________________________________________  RADIOLOGY  None ____________________________________________   PROCEDURES  Procedure(s) performed: None  Critical Care performed: No  ____________________________________________   INITIAL IMPRESSION / ASSESSMENT AND PLAN / ED COURSE  Pertinent labs & imaging results that were available during my care of the patient were reviewed by me and considered in my medical decision making (see chart for details).  35 year old female with a history of pseudoseizures, diabetes, gastroparesis who presents with seizure-like activity. Seizure-like activity stops immediately to voice. Patient is not post ictal and answers questions alertly and appropriately. She denies SI/HI/AH/VH but admits to increased stress recently. She also tells  pharmacy tech that she has been out of her Keppra, which likely contributes to her seizure-like activity. Patient exhibits no focal neurological deficits. Will obtain screening lab work and infuse IV Keppra.  ----------------------------------------- 4:54 AM on 08/21/2015 -----------------------------------------  Patient resting in no acute distress. She is awake, alert, smiling. Tolerated sips of ginger ale without emesis. Discussed with patient and given strict return precautions. Her prescription refilled. And she was encouraged to follow up with her PCP. Strict return precautions given. Patient verbalizes understanding and agrees with plan of care. 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     Note:  This document was prepared using Dragon voice recognition software and may include unintentional dictation errors.    Paulette Blanch, MD 08/21/15 [phone]

## 2015-08-21 NOTE — Discharge Instructions (Signed)
You were seen in the emergency department for seizure-like activity. Your Keppra prescription has been refilled. Please take this daily as directed by your doctor. Return to the ER for worsening symptoms, persistent vomiting, difficulty breathing or other concerns.  Epilepsy People with epilepsy have times when they shake and jerk uncontrollably (seizures). This happens when there is a sudden change in brain function. Epilepsy may have many possible causes. Anything that disturbs the normal pattern of brain cell activity can lead to seizures. HOME CARE   Follow your doctor's instructions about driving and safety during normal activities.  Get enough sleep.  Only take medicine as told by your doctor.  Avoid things that you know can cause you to have seizures (triggers).  Write down when your seizures happen and what you remember about each seizure. Write down anything you think may have caused the seizure to happen.  Tell the people you live and work with that you have seizures. Make sure they know how to help you. They should:  Cushion your head and body.  Turn you on your side.  Not restrain you.  Not place anything inside your mouth.  Call for local emergency medical help if there is any question about what has happened.  Keep all follow-up visits with your doctor. This is very important. GET HELP IF:  You get an infection or start to feel sick. You may have more seizures when you are sick.  You are having seizures more often.  Your seizure pattern is changing. GET HELP RIGHT AWAY IF:   A seizure does not stop after a few seconds or minutes.  A seizure causes you to have trouble breathing.  A seizure gives you a very bad headache.  A seizure makes you unable to speak or use a part of your body.   This information is not intended to replace advice given to you by your health care provider. Make sure you discuss any questions you have with your health care provider.     Document Released: 01/06/2009 Document Revised: 12/30/2012 Document Reviewed: 10/21/2012 Elsevier Interactive Patient Education [employer].

## 2015-08-21 NOTE — ED Notes (Signed)
Pt discharged to home.  Family member driving.  Discharge instructions reviewed.  Verbalized understanding.  No questions or concerns at this time.  Teach back verified.  Pt in NAD.  No items left in ED.   

## 2015-10-23 IMAGING — CT CT ABD-PELV W/ CM
2 of 4 series · 16 of 46 positions shown, 18 images · IV contrast (isovue)
Comparison: CT of the abdomen and pelvis 02/28/2013.

CLINICAL DATA: Nausea.  Epigastric abdominal pain.

EXAM:
CT ABDOMEN AND PELVIS WITH CONTRAST
TECHNIQUE: Multidetector CT imaging of the abdomen and pelvis was performed
using the standard protocol following bolus administration of
intravenous contrast.
CONTRAST:  100 mL of Isovue 370.

[Series 2: routine abd pel with · axial · 0.67mm/px · z∈[+408,+828]mm · 13 of 92 slices shown, 15 images]
[im 4/92  soft-tissue]
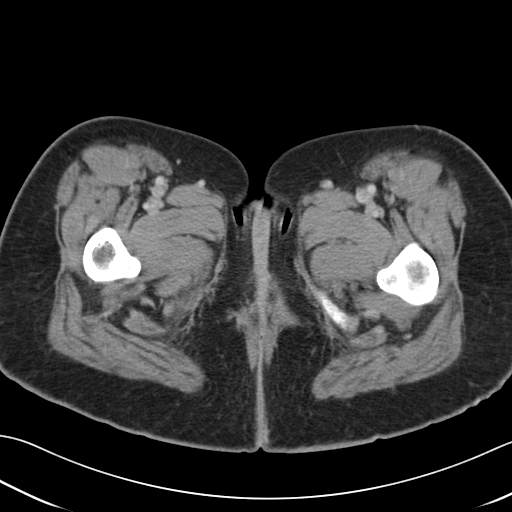
[im 4/92  bone]
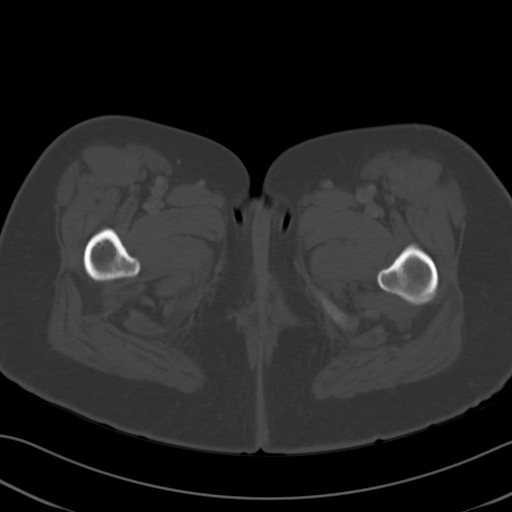
[im 12/92  soft-tissue]
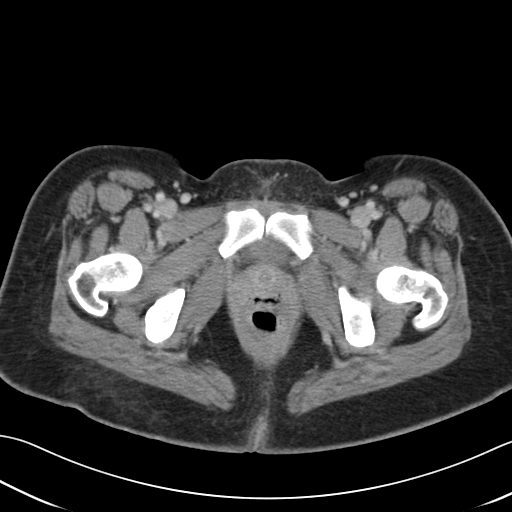
[im 19/92  soft-tissue]
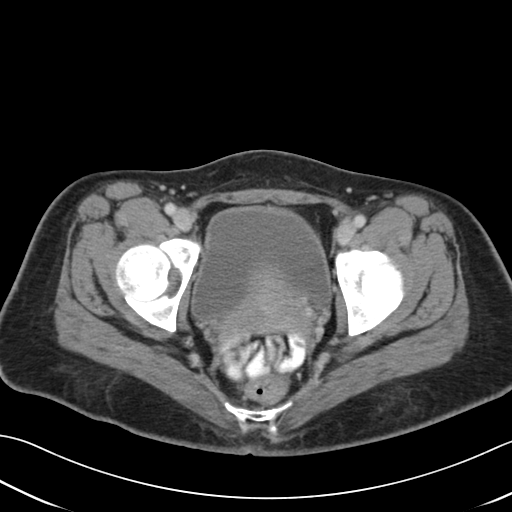
[im 27/92  soft-tissue]
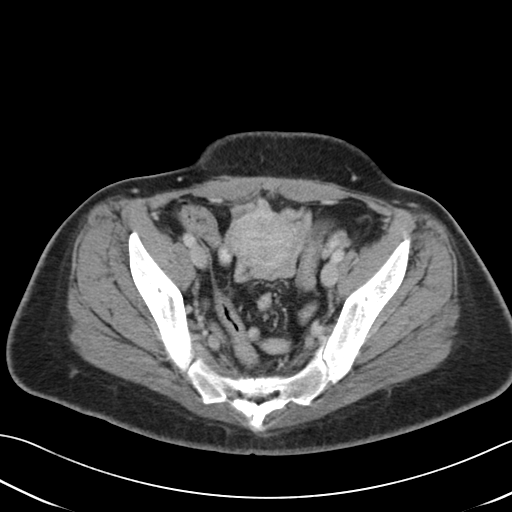
[im 31/92  soft-tissue]
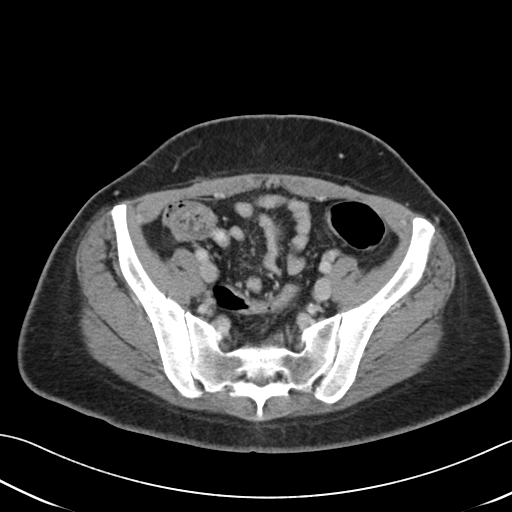
[im 38/92  soft-tissue]
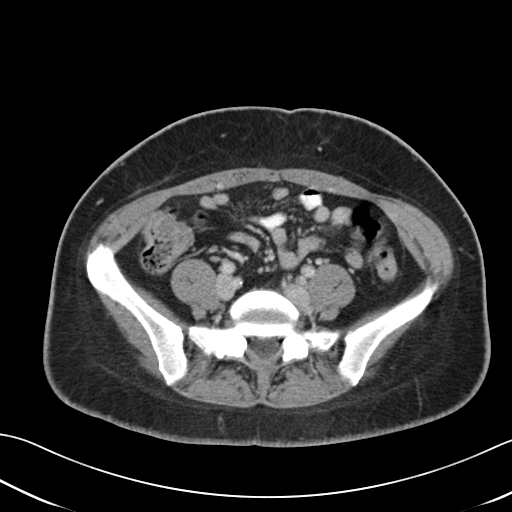
[im 46/92  soft-tissue]
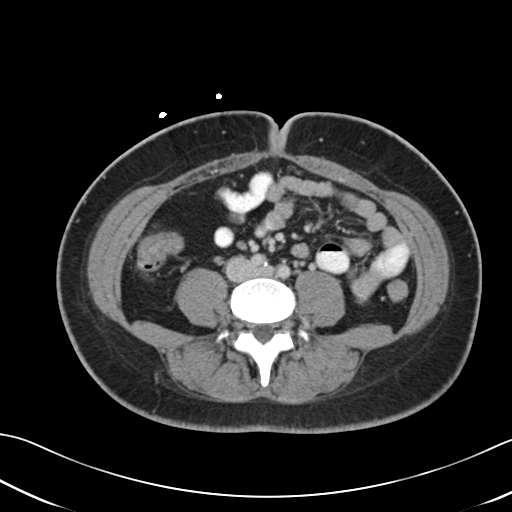
[im 54/92  soft-tissue]
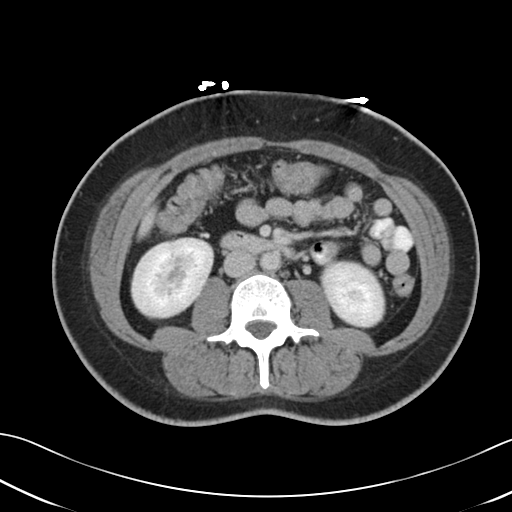
[im 61/92  soft-tissue]
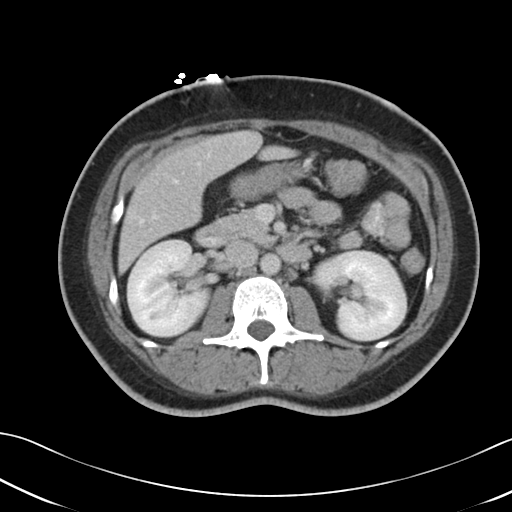
[im 61/92  bone]
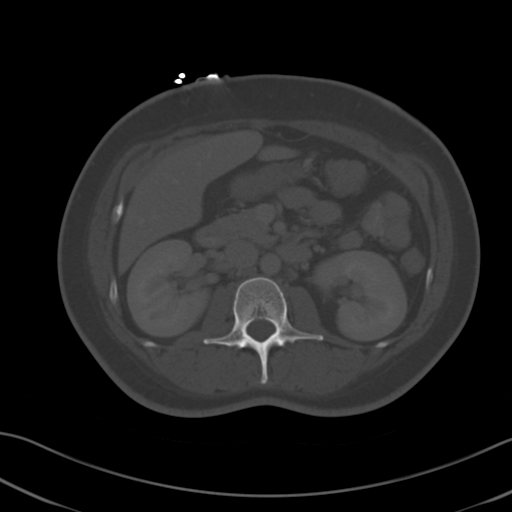
[im 65/92  soft-tissue]
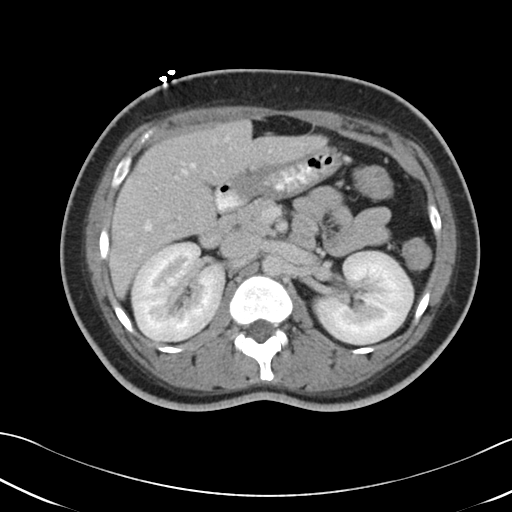
[im 73/92  soft-tissue]
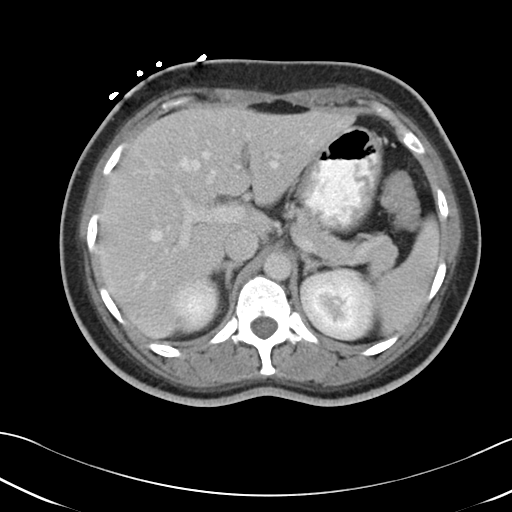
[im 80/92  soft-tissue]
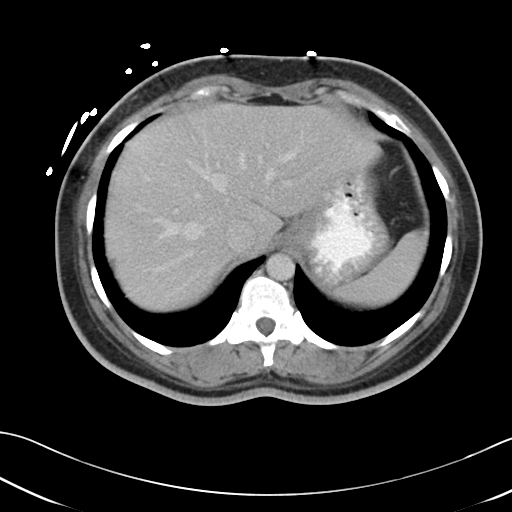
[im 88/92  soft-tissue]
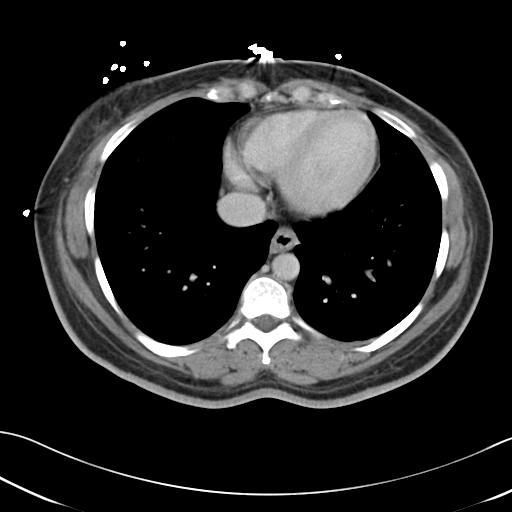

[Series 5: cor routine abd pel with · coronal · 0.62mm/px · 3 of 137 slices shown]
[im 46/137  soft-tissue]
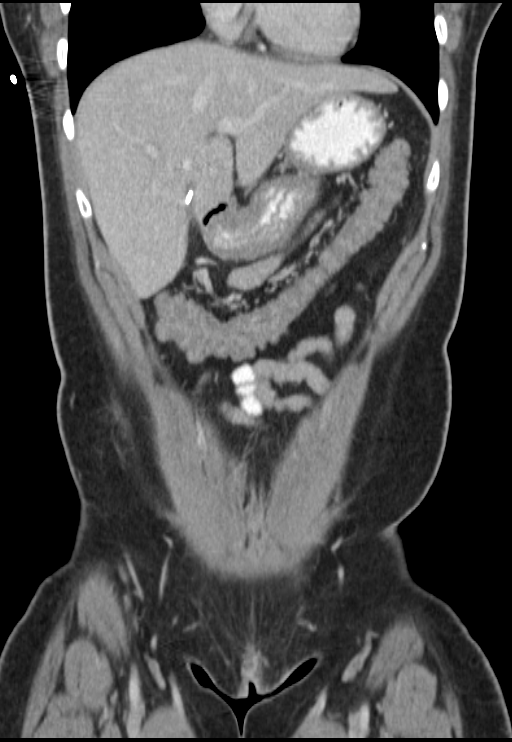
[im 61/137  soft-tissue]
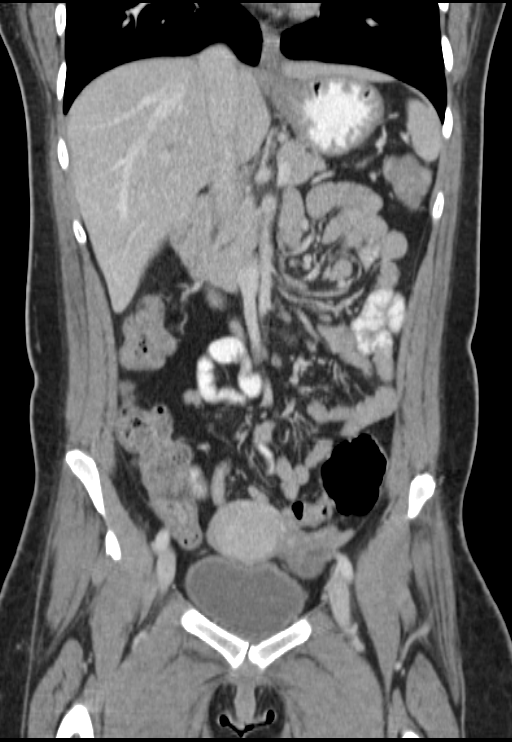
[im 76/137  soft-tissue]
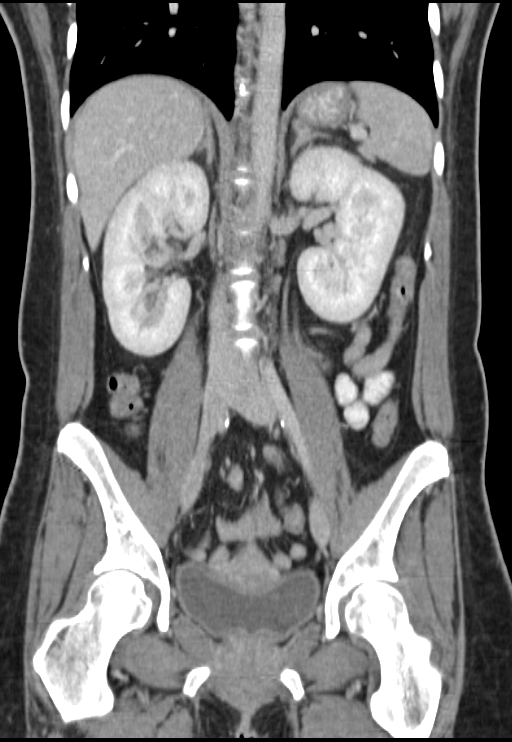

[16 of 46 positions shown; findings below may reference images not displayed]

FINDINGS: Lung Bases: Unremarkable.

Abdomen/Pelvis: Status post cholecystectomy. The appearance of the
liver, pancreas, spleen, bilateral adrenal glands and bilateral
kidneys is unremarkable. No significant volume of ascites. No
pneumoperitoneum. No pathologic distention of small bowel. No
definite lymphadenopathy identified within the abdomen or pelvis.
Mild atherosclerosis in the abdominal and pelvic vasculature,
without evidence of aneurysm. Uterus and right ovary are
unremarkable. In the left ovary there is a 1.9 x 1.4 x 1.6 cm
well-defined low-attenuation lesion, likely to represent a small
follicle. Urinary bladder is unremarkable in appearance. The
appendix is not confidently identified. Regardless, there are no
inflammatory changes surrounding the cecum to suggest an acute
appendicitis at this time.

Musculoskeletal: There are no aggressive appearing lytic or blastic
lesions noted in the visualized portions of the skeleton.
IMPRESSION: 1. No acute findings in the abdomen or pelvis to account for the
patient's symptoms.
2. Status post cholecystectomy.
3. Additional incidental findings, as above.

## 2015-12-15 ENCOUNTER — Encounter: Payer: Self-pay | Admitting: Emergency Medicine

## 2015-12-15 ENCOUNTER — Emergency Department
Admission: EM | Admit: 2015-12-15 | Discharge: 2015-12-15 | Disposition: A | Discharge status: Home / Self Care | Payer: Self-pay | Attending: Emergency Medicine | Admitting: Emergency Medicine

## 2015-12-15 DIAGNOSIS — B9789 Other viral agents as the cause of diseases classified elsewhere: Secondary | ICD-10-CM

## 2015-12-15 DIAGNOSIS — E119 Type 2 diabetes mellitus without complications: Secondary | ICD-10-CM | POA: Insufficient documentation

## 2015-12-15 DIAGNOSIS — J111 Influenza due to unidentified influenza virus with other respiratory manifestations: Secondary | ICD-10-CM

## 2015-12-15 DIAGNOSIS — Z87891 Personal history of nicotine dependence: Secondary | ICD-10-CM | POA: Insufficient documentation

## 2015-12-15 DIAGNOSIS — J069 Acute upper respiratory infection, unspecified: Secondary | ICD-10-CM | POA: Insufficient documentation

## 2015-12-15 DIAGNOSIS — R69 Illness, unspecified: Secondary | ICD-10-CM

## 2015-12-15 DIAGNOSIS — M791 Myalgia: Secondary | ICD-10-CM | POA: Insufficient documentation

## 2015-12-15 LAB — CBC WITH DIFFERENTIAL/PLATELET
BASOS ABS: 0.1 10*3/uL (ref 0–0.1)
Basophils Relative: 1 %
Eosinophils Absolute: 0.4 10*3/uL (ref 0–0.7)
Eosinophils Relative: 3 %
HEMATOCRIT: 41.1 % (ref 35.0–47.0)
Hemoglobin: 14 g/dL (ref 12.0–16.0)
LYMPHS ABS: 2.6 10*3/uL (ref 1.0–3.6)
LYMPHS PCT: 16 %
MCH: 29.2 pg (ref 26.0–34.0)
MCHC: 34 g/dL (ref 32.0–36.0)
MCV: 85.9 fL (ref 80.0–100.0)
Monocytes Absolute: 0.9 10*3/uL (ref 0.2–0.9)
Monocytes Relative: 5 %
NEUTROS ABS: 12.9 10*3/uL — AB (ref 1.4–6.5)
Neutrophils Relative %: 75 %
Platelets: 205 10*3/uL (ref 150–440)
RBC: 4.79 MIL/uL (ref 3.80–5.20)
RDW: 13 % (ref 11.5–14.5)
WBC: 17 10*3/uL — ABNORMAL HIGH (ref 3.6–11.0)

## 2015-12-15 LAB — URINE DRUG SCREEN, QUALITATIVE (ARMC ONLY)
AMPHETAMINES, UR SCREEN: NOT DETECTED
BENZODIAZEPINE, UR SCRN: NOT DETECTED
Barbiturates, Ur Screen: NOT DETECTED
Cannabinoid 50 Ng, Ur ~~LOC~~: POSITIVE — AB
Cocaine Metabolite,Ur ~~LOC~~: NOT DETECTED
MDMA (ECSTASY) UR SCREEN: NOT DETECTED
METHADONE SCREEN, URINE: NOT DETECTED
OPIATE, UR SCREEN: NOT DETECTED
PHENCYCLIDINE (PCP) UR S: NOT DETECTED
Tricyclic, Ur Screen: NOT DETECTED

## 2015-12-15 LAB — URINALYSIS COMPLETE WITH MICROSCOPIC (ARMC ONLY)
BACTERIA UA: NONE SEEN
BILIRUBIN URINE: NEGATIVE
Glucose, UA: >500 mg/dL — AB
LEUKOCYTES UA: NEGATIVE
Nitrite: NEGATIVE
PH: 5 (ref 5.0–8.0)
Protein, ur: 100 mg/dL — AB
Specific Gravity, Urine: 1.036 — ABNORMAL HIGH (ref 1.005–1.030)

## 2015-12-15 LAB — BASIC METABOLIC PANEL
ANION GAP: 13 (ref 5–15)
BUN: 8 mg/dL (ref 6–20)
CHLORIDE: 104 mmol/L (ref 101–111)
CO2: 19 mmol/L — AB (ref 22–32)
Calcium: 9.3 mg/dL (ref 8.9–10.3)
Creatinine, Ser: 0.54 mg/dL (ref 0.44–1.00)
GFR calc Af Amer: >60 mL/min (ref >60)
GLUCOSE: 283 mg/dL — AB (ref 65–99)
POTASSIUM: 3.3 mmol/L — AB (ref 3.5–5.1)
Sodium: 136 mmol/L (ref 135–145)

## 2015-12-15 LAB — POCT RAPID STREP A: STREPTOCOCCUS, GROUP A SCREEN (DIRECT): NEGATIVE

## 2015-12-15 LAB — GLUCOSE, CAPILLARY
GLUCOSE-CAPILLARY: 270 mg/dL — AB (ref 65–99)
Glucose-Capillary: 281 mg/dL — ABNORMAL HIGH (ref 65–99)

## 2015-12-15 MED ORDER — ONDANSETRON 4 MG PO TBDP
4.0000 mg | ORAL_TABLET | Freq: Once | ORAL | Status: AC
  Administered 2015-12-15: 4 mg via ORAL
  Filled 2015-12-15: qty 1

## 2015-12-15 MED ORDER — ONDANSETRON HCL 4 MG/2ML IJ SOLN
4.0000 mg | INTRAMUSCULAR | Status: AC
  Administered 2015-12-15: 4 mg via INTRAVENOUS
  Filled 2015-12-15: qty 2

## 2015-12-15 MED ORDER — OSELTAMIVIR PHOSPHATE 75 MG PO CAPS: 75.0000 mg | ORAL_CAPSULE | Freq: Two times a day (BID) | ORAL | 0 refills | Status: DC

## 2015-12-15 MED ORDER — PSEUDOEPH-BROMPHEN-DM 30-2-10 MG/5ML PO SYRP: 10.0000 mL | ORAL_SOLUTION | Freq: Four times a day (QID) | ORAL | 0 refills | Status: DC | PRN

## 2015-12-15 MED ORDER — IBUPROFEN 100 MG/5ML PO SUSP
ORAL | Status: AC
  Administered 2015-12-15: 800 mg via ORAL
  Filled 2015-12-15: qty 40

## 2015-12-15 MED ORDER — DIPHENHYDRAMINE HCL 50 MG/ML IJ SOLN
12.5000 mg | Freq: Once | INTRAMUSCULAR | Status: AC
  Administered 2015-12-15: 12.5 mg via INTRAVENOUS
  Filled 2015-12-15: qty 1

## 2015-12-15 MED ORDER — KETOROLAC TROMETHAMINE 30 MG/ML IJ SOLN
15.0000 mg | Freq: Once | INTRAMUSCULAR | Status: AC
  Administered 2015-12-15: 15 mg via INTRAVENOUS

## 2015-12-15 MED ORDER — ONDANSETRON HCL 4 MG PO TABS: 4.0000 mg | ORAL_TABLET | Freq: Every day | ORAL | 0 refills | Status: DC | PRN

## 2015-12-15 MED ORDER — KETOROLAC TROMETHAMINE 30 MG/ML IJ SOLN
INTRAMUSCULAR | Status: AC
  Administered 2015-12-15: 15 mg via INTRAVENOUS
  Filled 2015-12-15: qty 1

## 2015-12-15 MED ORDER — FLUTICASONE PROPIONATE 50 MCG/ACT NA SUSP: 2.0000 | Freq: Every day | NASAL | 0 refills | Status: DC

## 2015-12-15 MED ORDER — IBUPROFEN 100 MG/5ML PO SUSP
800.0000 mg | Freq: Once | ORAL | Status: AC
  Administered 2015-12-15: 800 mg via ORAL

## 2015-12-15 MED ORDER — KETOROLAC TROMETHAMINE 30 MG/ML IJ SOLN
15.0000 mg | Freq: Once | INTRAMUSCULAR | Status: AC
  Administered 2015-12-15: 15 mg via INTRAVENOUS
  Filled 2015-12-15: qty 1

## 2015-12-15 MED ORDER — HALOPERIDOL LACTATE 5 MG/ML IJ SOLN
2.5000 mg | Freq: Once | INTRAMUSCULAR | Status: AC
  Administered 2015-12-15: 2.5 mg via INTRAVENOUS
  Filled 2015-12-15: qty 1

## 2015-12-15 MED ORDER — SODIUM CHLORIDE 0.9 % IV SOLN
1000.0000 mg | INTRAVENOUS | Status: AC
  Administered 2015-12-15: 1000 mg via INTRAVENOUS
  Filled 2015-12-15: qty 10

## 2015-12-15 NOTE — ED Notes (Signed)
Pt. Going home in blue scrubs.  Pt. Going home with friend.

## 2015-12-15 NOTE — ED Triage Notes (Signed)
Pt presents with URI symptoms x 2 days. Pt states her daughter had 101 temp a few days ago and she "snuggled" with her and became sick right after. Pt alert & oriented with NAD Noted.

## 2015-12-15 NOTE — ED Triage Notes (Signed)
Pt c/o cough with congestion for the past 2 days

## 2015-12-15 NOTE — Discharge Instructions (Addendum)
Please use your prescribed medications as instructed, as well as taking your seizure medication that you were prescribed previously.

## 2015-12-15 NOTE — ED Notes (Signed)
Patient is shaking in bed. Boyfriend states 'this is what it looks like when she has seizures'. Patient (while shaking) states "yes, this is my seizure".

## 2015-12-15 NOTE — ED Notes (Signed)
Cleaned pt and changed bed linen. Gave pt two warm blankets

## 2015-12-15 NOTE — ED Provider Notes (Signed)
[facility] Emergency Department Provider Note  ____________________________________________  Time seen: Approximately 1:41 PM  I have reviewed the triage vital signs and the nursing notes.   HISTORY  Chief Complaint URI    HPI Rachael Gray is a 35 y.o. female , NAD, presents to the emergency department with 2 day history of fever, chills, body aches, sore throat, nasal congestion, runny nose. Patient states that she has child at home as been sick with similar symptoms. She was consoled the child and noted the day after that she began becoming ill as well. Has had a cough with some chest pain with the cough only but no production of cough. Denies any wheezing or shortness of breath. Has not had any palpitations, abdominal pain, nausea, vomiting. Denies any changes in urinary or bowel habits. Has not received flu vaccine this year. Has been taking TheraFlu over-the-counter without significant relief of symptoms.   Past Medical History:  Diagnosis Date  . Diabetes mellitus without complication ([place])   . Gastroparesis   . Lupus ([place])   . Pseudoseizures   . Seizures [facility])     Patient Active Problem List   Diagnosis Date Noted  . Intractable nausea and vomiting 12/10/2014  . Nausea & vomiting 12/10/2014  . Narcotic abuse 12/09/2014  . Sepsis ([place]) 12/07/2014  . Anxiety   . Pyelonephritis 12/04/2014  . Lactic acidosis 12/04/2014    Class: Acute    Past Surgical History:  Procedure Laterality Date  . APPENDECTOMY    . CHOLECYSTECTOMY      Prior to Admission medications   Medication Sig Start Date End Date Taking? Authorizing Provider  brompheniramine-pseudoephedrine-DM 30-2-10 MG/5ML syrup Take 10 mLs by mouth 4 (four) times daily as needed. 12/15/15   Jami L Hagler, PA-C  dicyclomine (BENTYL) 20 MG tablet Take 1 tablet (20 mg total) by mouth 3 (three) times daily as needed for spasms. 01/24/15   Carrie Mew, MD  fluticasone [facility]) 50  MCG/ACT nasal spray Place 2 sprays into both nostrils daily. 12/15/15   Jami L Hagler, PA-C  insulin NPH-regular Human (NOVOLIN 70/30) (70-30) 100 UNIT/ML injection Inject 22-25 Units into the skin 2 (two) times daily with a meal. 25 units in the morning and 22 units in the evening    Historical Provider, MD  levETIRAcetam (KEPPRA) 750 MG tablet Take 1 tablet (750 mg total) by mouth 2 (two) times daily. 08/21/15   Paulette Blanch, MD  ondansetron (ZOFRAN) 4 MG tablet Take 1 tablet (4 mg total) by mouth daily as needed for nausea or vomiting. 12/15/15 12/22/15  Jami L Hagler, PA-C  oseltamivir (TAMIFLU) 75 MG capsule Take 1 capsule (75 mg total) by mouth 2 (two) times daily. 12/15/15   Jami L Hagler, PA-C  promethazine (PHENERGAN) 25 MG tablet Take 1 tablet (25 mg total) by mouth every 6 (six) hours as needed for nausea or vomiting. 06/27/15   Carrie Mew, MD    Allergies Doxycycline; Morphine and related; Reglan [metoclopramide]; and Tylenol [acetaminophen]  History reviewed. No pertinent family history.  Social History Social History  Substance Use Topics  . Smoking status: Former Smoker    Packs/day: 1.00    Types: Cigarettes  . Smokeless tobacco: Never Used  . Alcohol use Yes     Review of Systems  Constitutional: Positive fever, chills, fatigue Eyes: No visual changes. No discharge, redness, pain ENT: Positive sore throat, nasal congestion, runny nose. No ear pain, sinus pressure. Cardiovascular: No chest pain. Respiratory: Positive  cough without production. No shortness of breath. No wheezing.  Gastrointestinal: No abdominal pain.  No nausea, vomiting.   Genitourinary: Negative for dysuria, hematuria. No urinary hesitancy, urgency or increased frequency. Musculoskeletal: Positive for general myalgias but no joint swelling.  Skin: Negative for rash. Neurological: Negative for headaches, focal weakness or numbness. No tingling. 10-point ROS otherwise  negative.  ____________________________________________   PHYSICAL EXAM:  VITAL SIGNS: ED Triage Vitals [12/15/15 1333]  Enc Vitals Group     BP 121/84     Pulse Rate 95     Resp 20     Temp 99.1 F (37.3 C)     Temp Source Oral     SpO2 100 %     Weight 142 lb (64.4 kg)     Height 5\' 5"  (1.651 m)     Head Circumference      Peak Flow      Pain Score 7     Pain Loc      Pain Edu?      Excl. in [place]?      Constitutional: Alert and oriented. Ill appearing but in no acute distress. Eyes: Conjunctivae are normal Without icterus or injection. PERRL. EOMI without pain.  Head: Atraumatic. ENT:      Ears: TMs visualized bilaterally with trace serous effusion and mild bulging but no perforation or erythema. Bilateral ear canals without erythema, swelling, drainage.      Nose: Moderate congestion with profuse clear rhinorrhea. Turbinates are injected bilaterally.      Mouth/Throat: Mucous membranes are moist. Pharynx with moderate injection but no exudate or swelling. Uvula is midline. Profuse clear postnasal drip. Neck: No stridor. Supple with full range of motion. Trachea is midline. Hematological/Lymphatic/Immunilogical: No cervical lymphadenopathy. Cardiovascular: Normal rate, regular rhythm. Normal S1 and S2.  Good peripheral circulation. Respiratory: Normal respiratory effort without tachypnea or retractions. Lungs CTAB with breath sounds noted in all lung fields. No wheeze, rhonchi, rales. Neurologic:  Normal speech and language. No gross focal neurologic deficits are appreciated.  Skin:  Skin is warm, dry and intact. No rash noted. Psychiatric: Mood and affect are normal. Speech and behavior are normal. Patient exhibits appropriate insight and judgement.   ____________________________________________   LABS (all labs ordered are listed, but only abnormal results are displayed)  Labs Reviewed  GLUCOSE, CAPILLARY - Abnormal; Notable for the following:       Result Value    Glucose-Capillary 270 (*)    All other components within normal limits  POCT RAPID STREP A  CBG MONITORING, ED   ____________________________________________  EKG  EKG reveals normal sinus rhythm with a ventricular rate of 91 bpm. No acute changes. No evidence of STEMI. EKG also reviewed by Dr. Lenise Arena. ____________________________________________  RADIOLOGY  I have personally viewed and evaluated these images (plain radiographs) as part of my medical decision making, as well as reviewing the written report by the radiologist.  No results found.  ____________________________________________    PROCEDURES  Procedure(s) performed: None   Procedures   Medications  ondansetron (ZOFRAN-ODT) disintegrating tablet 4 mg (4 mg Oral Given 12/15/15 1410)  ibuprofen (ADVIL,MOTRIN) 100 MG/5ML suspension 800 mg (800 mg Oral Given 12/15/15 1435)     ____________________________________________   INITIAL IMPRESSION / ASSESSMENT AND PLAN / ED COURSE  Pertinent labs & imaging results that were available during my care of the patient were reviewed by me and considered in my medical decision making (see chart for details).  Clinical Course  Comment By Time  Patient became nauseous with some vomiting after strep swab was completed. Zofran was given. Braxton Feathers, PA-C 09/22 1407  Unfortunately rapid flu tests are not available in the lab at this time. Will go ahead and treat the patient for such as symptoms are consistent. Braxton Feathers, PA-C 09/22 1416  Patient has noted lower back pain and full body shaking since onset of nausea and vomiting. She is alert, talking in full sentences without any pupillary change. We will check blood glucose and administer Tylenol. Braxton Feathers, PA-C 09/22 1422  I was called into the patient's exam room around 2:50 PM by the significant other stating that the patient was having a seizure. Patient was visualized laying on her left side,  symmetric/slow body gyrations with no muscle stiffness or tightness. Significant other states that this is normal for the patient and previous seizures. States they can happen back to back a few minutes between each episode. The significant other at the bedside does not believe the patient has not been taking her medications as prescribed for chronic conditions. Patient will be transferred to room #10 for further evaluation and treatment by a attending physician. Braxton Feathers, PA-C 09/22 1455       ____________________________________________  FINAL CLINICAL IMPRESSION(S) / ED DIAGNOSES  Final diagnoses:  Viral URI with cough  Influenza-like illness      NEW MEDICATIONS STARTED DURING THIS VISIT:  New Prescriptions   BROMPHENIRAMINE-PSEUDOEPHEDRINE-DM 30-2-10 MG/5ML SYRUP    Take 10 mLs by mouth 4 (four) times daily as needed.   FLUTICASONE (FLONASE) 50 MCG/ACT NASAL SPRAY    Place 2 sprays into both nostrils daily.   ONDANSETRON (ZOFRAN) 4 MG TABLET    Take 1 tablet (4 mg total) by mouth daily as needed for nausea or vomiting.   OSELTAMIVIR (TAMIFLU) 75 MG CAPSULE    Take 1 capsule (75 mg total) by mouth 2 (two) times daily.         Braxton Feathers, PA-C 12/15/15 1503    Earleen Newport, MD 12/15/15 [phone]

## 2015-12-15 NOTE — ED Provider Notes (Signed)
----------------------------------------- 3:29 PM on 12/15/2015 -----------------------------------------  The patient was brought to the main side of the emergency department after having pseudoseizure activity in flex.  As she was being settled in to her room, I had the opportunity to review multiple notes in the computer regarding prior admissions with similar behavior.  Approximately one year ago she had pyelonephritis and had voluntary seizure-like activity and has even been evaluated by psychiatry in the past who reviewed prior neurology notes and sees no prior evidence of true seizures; Dr. Weber Cooks commented that it is most likely "an abundance of caution" that resulted in her being prescribed Keppra, although she is noncompliant with the [place] anyway.  Dr. Weber Cooks also commented that the patient knows that she is doing it and that she told him that she is aware and sometimes can stop the shaking.  He believes that this is the way that she handles stress when she is feeling ill.  I went into the room and she is continuing to have some slow rhythmic shaking of her entire body but she remains alert, oriented, and demanding pain medication as she did a year ago during her ED visit.  I applied a painful stimulus to her toenail and she immediately stopped the rhythmic shaking and jumped up and asked me what the hell I was doing, then lied back down and resumed her seizure-like activity.  I explained to her that pain medication and specifically Dilaudid which is her preference does not cure seizures and would only run the risk of making the situation worse.  I explained to her that we will treat her discomfort and monitor her.  She states that she developed some back pain because of all the vomiting she was having and that she has persistent nausea.  She also states that when he took a throat swab that was what made her nauseated.  I will treat her with Haldol 2.5 mg IV, Benadryl 12.5 mg IV, both of which  should help with her nausea.  Additionally I will give her a small dose of Toradol 15 mg IV to help with her discomfort.  I told her that we would not be treating her pseudoseizures or her constellation of symptoms with narcotics at this time but that I am more than happy to help her feel better in general with any nonnarcotic medications.  Also give her a small fluid bolus and check some basic labs which are not checked previously.  She is displeased because she feels like pain medication will help her much more, but her fianc understands and agrees.  Also checking a urinalysis given her history of multiple urinary tract infections.  Clinical Course  Comment By Time  Patient became nauseous with some vomiting after strep swab was completed. Zofran was given. Rachael Gray, Rachael Gray 09/22 1407  Unfortunately rapid flu tests are not available in the lab at this time. Will go ahead and treat the patient for such as symptoms are consistent. Rachael Gray, Rachael Gray 09/22 1416  Patient has noted lower back pain and full body shaking since onset of nausea and vomiting. She is alert, talking in full sentences without any pupillary change. We will check blood glucose and administer Tylenol. Rachael Gray, Rachael Gray 09/22 1422  I was called into the patient's exam room around 2:50 PM by the significant other stating that the patient was having a seizure. Patient was visualized laying on her left side, symmetric/slow body gyrations with no muscle stiffness or tightness. Significant  other states that this is normal for the patient and previous seizures. States they can happen back to back a few minutes between each episode. The significant other at the bedside does not believe the patient has not been taking her medications as prescribed for chronic conditions. Patient will be transferred to room #10 for further evaluation and treatment by a attending physician. Rachael Gray, Rachael Gray 09/22 1455  I observed the patient through the  window and the door and saw that she and her fianc were watching TV without any distress.  A few minutes later and looked after the nurse had entered to provide some medication and the patient had resumed her slow and rhythmic shaking. Rachael Gray, Rachael Gray 09/22 1600  Patient is lying comfortably and not having any additional seizure like activity.  Leukocytosis but she does have influenza like illness.  Labs otherwise unremarkable.  Will reassess for ability to send her home. Rachael Gray, Rachael Gray 09/22 1715  Patient no longer having pseudoseizure activity.  She is tolerating PO intake.  I explained that since she seems to have a viral illness is unlikely we will make her feel all better.  She and her fianc agree that she would rather be at home resting then here.  I will discharge her as per the original plan and encouraged her to follow up with her regular doctor. Rachael Gray, Rachael Gray 09/22 1748  Urinalysis unremarkable.  Urine drug screen still pending.  Patient has Keppra at home that she is just not taking.  Encouraged her to do so and to follow-up with her regular doctor at the next available opportunity.  Patient is stable. Rachael Gray, Rachael Gray 09/22 [name]      Rachael Gray, Rachael Gray 12/15/15 [phone]

## 2015-12-15 NOTE — ED Notes (Signed)
Pt. Moved from Flex to ED main.  Pt. Having conscious seizure like activity in room.  Pt. States back pain is 10/10.  Pt. Conscious and alert x4.

## 2015-12-15 NOTE — ED Notes (Signed)
Due to more resources needed for pt. Care, pt. Acuity changed from 4 to 3.

## 2015-12-16 ENCOUNTER — Observation Stay
Admission: EM | Admit: 2015-12-16 | Discharge: 2015-12-18 | Disposition: A | Discharge status: Home / Self Care | Payer: Self-pay | Attending: Family Medicine | Admitting: Family Medicine

## 2015-12-16 ENCOUNTER — Emergency Department: Payer: Self-pay

## 2015-12-16 DIAGNOSIS — Z885 Allergy status to narcotic agent status: Secondary | ICD-10-CM | POA: Insufficient documentation

## 2015-12-16 DIAGNOSIS — M329 Systemic lupus erythematosus, unspecified: Secondary | ICD-10-CM | POA: Insufficient documentation

## 2015-12-16 DIAGNOSIS — R1110 Vomiting, unspecified: Principal | ICD-10-CM | POA: Diagnosis present

## 2015-12-16 DIAGNOSIS — E1065 Type 1 diabetes mellitus with hyperglycemia: Secondary | ICD-10-CM

## 2015-12-16 DIAGNOSIS — E119 Type 2 diabetes mellitus without complications: Secondary | ICD-10-CM | POA: Insufficient documentation

## 2015-12-16 DIAGNOSIS — Z881 Allergy status to other antibiotic agents status: Secondary | ICD-10-CM | POA: Insufficient documentation

## 2015-12-16 DIAGNOSIS — IMO0002 Reserved for concepts with insufficient information to code with codable children: Secondary | ICD-10-CM

## 2015-12-16 DIAGNOSIS — A419 Sepsis, unspecified organism: Secondary | ICD-10-CM

## 2015-12-16 DIAGNOSIS — F445 Conversion disorder with seizures or convulsions: Secondary | ICD-10-CM | POA: Insufficient documentation

## 2015-12-16 DIAGNOSIS — R112 Nausea with vomiting, unspecified: Secondary | ICD-10-CM

## 2015-12-16 DIAGNOSIS — Z8719 Personal history of other diseases of the digestive system: Secondary | ICD-10-CM | POA: Insufficient documentation

## 2015-12-16 DIAGNOSIS — Z9049 Acquired absence of other specified parts of digestive tract: Secondary | ICD-10-CM | POA: Insufficient documentation

## 2015-12-16 DIAGNOSIS — Z79899 Other long term (current) drug therapy: Secondary | ICD-10-CM | POA: Insufficient documentation

## 2015-12-16 DIAGNOSIS — E108 Type 1 diabetes mellitus with unspecified complications: Secondary | ICD-10-CM

## 2015-12-16 DIAGNOSIS — Z888 Allergy status to other drugs, medicaments and biological substances status: Secondary | ICD-10-CM | POA: Insufficient documentation

## 2015-12-16 DIAGNOSIS — Z87891 Personal history of nicotine dependence: Secondary | ICD-10-CM | POA: Insufficient documentation

## 2015-12-16 DIAGNOSIS — Z886 Allergy status to analgesic agent status: Secondary | ICD-10-CM | POA: Insufficient documentation

## 2015-12-16 LAB — BLOOD GAS, ARTERIAL
Acid-base deficit: 5.9 mmol/L — ABNORMAL HIGH (ref 0.0–2.0)
Bicarbonate: 18.8 mmol/L — ABNORMAL LOW (ref 20.0–28.0)
FIO2: 0.21
O2 SAT: 96.1 %
PCO2 ART: 34 mmHg (ref 32.0–48.0)
PH ART: 7.35 (ref 7.350–7.450)
Patient temperature: 37
pO2, Arterial: 87 mmHg (ref 83.0–108.0)

## 2015-12-16 LAB — CBC WITH DIFFERENTIAL/PLATELET
BASOS ABS: 0.1 10*3/uL (ref 0–0.1)
BASOS PCT: 0 %
BASOS PCT: 0 %
Basophils Absolute: 0 10*3/uL (ref 0–0.1)
EOS PCT: 0 %
Eosinophils Absolute: 0 10*3/uL (ref 0–0.7)
Eosinophils Absolute: 0 10*3/uL (ref 0–0.7)
Eosinophils Relative: 0 %
HCT: 39.3 % (ref 35.0–47.0)
HEMATOCRIT: 41.5 % (ref 35.0–47.0)
HEMOGLOBIN: 14.2 g/dL (ref 12.0–16.0)
Hemoglobin: 12.7 g/dL (ref 12.0–16.0)
LYMPHS PCT: 3 %
LYMPHS PCT: 5 %
Lymphs Abs: 0.7 10*3/uL — ABNORMAL LOW (ref 1.0–3.6)
Lymphs Abs: 0.9 10*3/uL — ABNORMAL LOW (ref 1.0–3.6)
MCH: 29.1 pg (ref 26.0–34.0)
MCH: 28.2 pg (ref 26.0–34.0)
MCHC: 34.1 g/dL (ref 32.0–36.0)
MCHC: 32.3 g/dL (ref 32.0–36.0)
MCV: 85.2 fL (ref 80.0–100.0)
MCV: 87.2 fL (ref 80.0–100.0)
MONOS PCT: 2 %
Monocytes Absolute: 0.4 10*3/uL (ref 0.2–0.9)
Monocytes Absolute: 0.6 10*3/uL (ref 0.2–0.9)
Monocytes Relative: 3 %
NEUTROS ABS: 20.5 10*3/uL — AB (ref 1.4–6.5)
NEUTROS PCT: 95 %
Neutro Abs: 18.7 10*3/uL — ABNORMAL HIGH (ref 1.4–6.5)
Neutrophils Relative %: 92 %
PLATELETS: 207 10*3/uL (ref 150–440)
Platelets: 212 10*3/uL (ref 150–440)
RBC: 4.87 MIL/uL (ref 3.80–5.20)
RBC: 4.51 MIL/uL (ref 3.80–5.20)
RDW: 13.1 % (ref 11.5–14.5)
RDW: 13 % (ref 11.5–14.5)
WBC: 21.7 10*3/uL — ABNORMAL HIGH (ref 3.6–11.0)
WBC: 20.3 10*3/uL — AB (ref 3.6–11.0)

## 2015-12-16 LAB — URINALYSIS COMPLETE WITH MICROSCOPIC (ARMC ONLY)
BACTERIA UA: NONE SEEN
Bilirubin Urine: NEGATIVE
Leukocytes, UA: NEGATIVE
Nitrite: NEGATIVE
Protein, ur: 100 mg/dL — AB
Specific Gravity, Urine: 1.029 (ref 1.005–1.030)
pH: 5 (ref 5.0–8.0)

## 2015-12-16 LAB — GLUCOSE, CAPILLARY
GLUCOSE-CAPILLARY: 233 mg/dL — AB (ref 65–99)
Glucose-Capillary: 232 mg/dL — ABNORMAL HIGH (ref 65–99)
Glucose-Capillary: 180 mg/dL — ABNORMAL HIGH (ref 65–99)
Glucose-Capillary: 184 mg/dL — ABNORMAL HIGH (ref 65–99)

## 2015-12-16 LAB — URINE DRUG SCREEN, QUALITATIVE (ARMC ONLY)
Amphetamines, Ur Screen: NOT DETECTED
Barbiturates, Ur Screen: NOT DETECTED
Benzodiazepine, Ur Scrn: POSITIVE — AB
COCAINE METABOLITE, UR ~~LOC~~: NOT DETECTED
Cannabinoid 50 Ng, Ur ~~LOC~~: POSITIVE — AB
MDMA (ECSTASY) UR SCREEN: NOT DETECTED
METHADONE SCREEN, URINE: NOT DETECTED
Opiate, Ur Screen: NOT DETECTED
Phencyclidine (PCP) Ur S: NOT DETECTED
TRICYCLIC, UR SCREEN: NOT DETECTED

## 2015-12-16 LAB — COMPREHENSIVE METABOLIC PANEL
ALBUMIN: 4.4 g/dL (ref 3.5–5.0)
ALK PHOS: 54 U/L (ref 38–126)
ALT: 11 U/L — ABNORMAL LOW (ref 14–54)
ANION GAP: 15 (ref 5–15)
AST: 15 U/L (ref 15–41)
BILIRUBIN TOTAL: 0.9 mg/dL (ref 0.3–1.2)
BUN: 14 mg/dL (ref 6–20)
CALCIUM: 9.5 mg/dL (ref 8.9–10.3)
CO2: 20 mmol/L — ABNORMAL LOW (ref 22–32)
Chloride: 103 mmol/L (ref 101–111)
Creatinine, Ser: 0.65 mg/dL (ref 0.44–1.00)
GFR calc Af Amer: >60 mL/min (ref >60)
GFR calc non Af Amer: >60 mL/min (ref >60)
GLUCOSE: 337 mg/dL — AB (ref 65–99)
Potassium: 3.7 mmol/L (ref 3.5–5.1)
Sodium: 138 mmol/L (ref 135–145)
TOTAL PROTEIN: 8.4 g/dL — AB (ref 6.5–8.1)

## 2015-12-16 LAB — LACTIC ACID, PLASMA
LACTIC ACID, VENOUS: 2.4 mmol/L — AB (ref 0.5–1.9)
Lactic Acid, Venous: 2.4 mmol/L (ref 0.5–1.9)

## 2015-12-16 LAB — PREGNANCY, URINE: PREG TEST UR: NEGATIVE

## 2015-12-16 LAB — LIPASE, BLOOD: Lipase: 14 U/L (ref 11–51)

## 2015-12-16 LAB — ETHANOL: Alcohol, Ethyl (B): <5 mg/dL (ref <5)

## 2015-12-16 MED ORDER — HALOPERIDOL LACTATE 5 MG/ML IJ SOLN
5.0000 mg | Freq: Once | INTRAMUSCULAR | Status: AC
  Administered 2015-12-16: 5 mg via INTRAVENOUS

## 2015-12-16 MED ORDER — SODIUM CHLORIDE 0.9 % IV BOLUS (SEPSIS)
1000.0000 mL | Freq: Once | INTRAVENOUS | Status: AC
  Administered 2015-12-16: 1000 mL via INTRAVENOUS

## 2015-12-16 MED ORDER — PIPERACILLIN-TAZOBACTAM 3.375 G IVPB 30 MIN
3.3750 g | Freq: Once | INTRAVENOUS | Status: AC
  Administered 2015-12-16: 3.375 g via INTRAVENOUS
  Filled 2015-12-16: qty 50

## 2015-12-16 MED ORDER — ONDANSETRON HCL 4 MG/2ML IJ SOLN
4.0000 mg | Freq: Four times a day (QID) | INTRAMUSCULAR | Status: DC
  Administered 2015-12-16 – 2015-12-18 (×8): 4 mg via INTRAVENOUS
  Filled 2015-12-16 (×8): qty 2

## 2015-12-16 MED ORDER — HALOPERIDOL LACTATE 5 MG/ML IJ SOLN
INTRAMUSCULAR | Status: AC
  Administered 2015-12-16: 5 mg via INTRAVENOUS
  Filled 2015-12-16: qty 1

## 2015-12-16 MED ORDER — PANTOPRAZOLE SODIUM 40 MG PO TBEC
40.0000 mg | DELAYED_RELEASE_TABLET | Freq: Every day | ORAL | Status: DC
  Administered 2015-12-16 – 2015-12-18 (×3): 40 mg via ORAL
  Filled 2015-12-16 (×3): qty 1

## 2015-12-16 MED ORDER — SODIUM CHLORIDE 0.9 % IV SOLN
INTRAVENOUS | Status: DC
Start: 2015-12-16 — End: 2015-12-18
  Administered 2015-12-16 – 2015-12-18 (×8): via INTRAVENOUS

## 2015-12-16 MED ORDER — PROMETHAZINE HCL 25 MG/ML IJ SOLN
12.5000 mg | Freq: Once | INTRAMUSCULAR | Status: AC
  Administered 2015-12-16: 12.5 mg via INTRAVENOUS
  Filled 2015-12-16 (×2): qty 1

## 2015-12-16 MED ORDER — INSULIN ASPART 100 UNIT/ML ~~LOC~~ SOLN
0.0000 [IU] | Freq: Three times a day (TID) | SUBCUTANEOUS | Status: DC
  Administered 2015-12-16: 3 [IU] via SUBCUTANEOUS
  Administered 2015-12-16: 2 [IU] via SUBCUTANEOUS
  Administered 2015-12-17: 3 [IU] via SUBCUTANEOUS
  Administered 2015-12-17 – 2015-12-18 (×2): 2 [IU] via SUBCUTANEOUS
  Administered 2015-12-18: 3 [IU] via SUBCUTANEOUS
  Filled 2015-12-16: qty 3
  Filled 2015-12-16 (×3): qty 2
  Filled 2015-12-16 (×2): qty 3

## 2015-12-16 MED ORDER — VANCOMYCIN HCL IN DEXTROSE 1-5 GM/200ML-% IV SOLN
1000.0000 mg | Freq: Once | INTRAVENOUS | Status: AC
  Administered 2015-12-16: 1000 mg via INTRAVENOUS
  Filled 2015-12-16: qty 200

## 2015-12-16 MED ORDER — LEVETIRACETAM 750 MG PO TABS
750.0000 mg | ORAL_TABLET | Freq: Two times a day (BID) | ORAL | Status: DC
  Administered 2015-12-16 – 2015-12-18 (×5): 750 mg via ORAL
  Filled 2015-12-16 (×5): qty 1

## 2015-12-16 MED ORDER — ACETAMINOPHEN 325 MG PO TABS: 650.0000 mg | ORAL_TABLET | ORAL | Status: DC | PRN

## 2015-12-16 MED ORDER — PROMETHAZINE HCL 25 MG/ML IJ SOLN
12.5000 mg | Freq: Once | INTRAMUSCULAR | Status: AC
  Administered 2015-12-16: 12.5 mg via INTRAVENOUS
  Filled 2015-12-16: qty 1

## 2015-12-16 MED ORDER — GI COCKTAIL ~~LOC~~
30.0000 mL | Freq: Once | ORAL | Status: AC
  Administered 2015-12-16: 30 mL via ORAL
  Filled 2015-12-16: qty 30

## 2015-12-16 MED ORDER — METOCLOPRAMIDE HCL 5 MG/ML IJ SOLN
5.0000 mg | Freq: Four times a day (QID) | INTRAMUSCULAR | Status: DC
  Administered 2015-12-16 – 2015-12-17 (×5): 5 mg via INTRAVENOUS
  Administered 2015-12-17: 10 mg via INTRAVENOUS
  Administered 2015-12-17 – 2015-12-18 (×3): 5 mg via INTRAVENOUS
  Filled 2015-12-16 (×9): qty 2

## 2015-12-16 MED ORDER — INSULIN ASPART 100 UNIT/ML ~~LOC~~ SOLN: 0.0000 [IU] | Freq: Every day | SUBCUTANEOUS | Status: DC

## 2015-12-16 NOTE — Progress Notes (Signed)
Multiple emesis with pt c/o stomach pain. MD Edwina Barth notified. Orders received gi cocktail, protonix 40 mg . Per MD, no narcotics at this time because of gastroparesis. Orders placed.

## 2015-12-16 NOTE — Progress Notes (Signed)
Pt continues to c/o stomach pain. Prime doc paged. MD returned call and notified. MD Hugelmyer looking and chart and will notify.

## 2015-12-16 NOTE — ED Notes (Signed)
Lactic acid 2.4  MD notified

## 2015-12-16 NOTE — ED Provider Notes (Signed)
[facility]  I accepted care from Dr. Beather Arbour ____________________________________________    LABS (pertinent positives/negatives)  Labs Reviewed  CBC WITH DIFFERENTIAL/PLATELET - Abnormal; Notable for the following:       Result Value   WBC 21.7 (*)    Neutro Abs 20.5 (*)    Lymphs Abs 0.7 (*)    All other components within normal limits  COMPREHENSIVE METABOLIC PANEL - Abnormal; Notable for the following:    CO2 20 (*)    Glucose, Bld 337 (*)    Total Protein 8.4 (*)    ALT 11 (*)    All other components within normal limits  URINALYSIS COMPLETEWITH MICROSCOPIC ([facility] ONLY) - Abnormal; Notable for the following:    Color, Urine STRAW (*)    APPearance CLEAR (*)    Glucose, UA >500 (*)    Ketones, ur 2+ (*)    Hgb urine dipstick 1+ (*)    Protein, ur 100 (*)    Squamous Epithelial / LPF 0-5 (*)    All other components within normal limits  URINE DRUG SCREEN, QUALITATIVE ([facility] ONLY) - Abnormal; Notable for the following:    Cannabinoid 50 Ng, Ur [place] POSITIVE (*)    Benzodiazepine, Ur Scrn POSITIVE (*)    All other components within normal limits  LACTIC ACID, PLASMA - Abnormal; Notable for the following:    Lactic Acid, Venous 2.4 (*)    All other components within normal limits  BLOOD GAS, ARTERIAL - Abnormal; Notable for the following:    Bicarbonate 18.8 (*)    Acid-base deficit 5.9 (*)    All other components within normal limits  LACTIC ACID, PLASMA - Abnormal; Notable for the following:    Lactic Acid, Venous 2.4 (*)    All other components within normal limits  CBC WITH DIFFERENTIAL/PLATELET - Abnormal; Notable for the following:    WBC 20.3 (*)    Neutro Abs 18.7 (*)    Lymphs Abs 0.9 (*)    All other components within normal limits  CULTURE, BLOOD (ROUTINE X 2)  CULTURE, BLOOD (ROUTINE X 2)  ETHANOL  LIPASE, BLOOD  LACTIC ACID, PLASMA  PREGNANCY, URINE     ____________________________________________   PROCEDURES  Procedure(s)  performed: None  Critical Care performed: None  ____________________________________________   INITIAL IMPRESSION / ASSESSMENT AND PLAN / ED COURSE   Pertinent labs & imaging results that were available during my care of the patient were reviewed by me and considered in my medical decision making (see chart for details).  I accepted care from Dr. Beather Arbour overnight. She is continued to have nonbloody and nonbilious emesis. She has abdominal cramping, and when I press on her abdomen its centered in the mid epigastrium and umbilicus. She does not have her appendix. No pelvic pain or lower abdominal tenderness to palpation. At this point I don't think that she needs abdominal imaging, this seems gastrointestinal in nature with major symptom as recurrent vomiting.  Plan was to recheck this patient's lactate and white blood cell count after IV fluid bolus. After 1 L normal saline, her blood pressure is lower than it has been out about systolic [id number], and her heart rate is tachycardic 101 -112.  Her lactate remained the same at 2.4. Her white blood cell count remains significantly elevated at 20,000. At this point in time, she is still symptomatic, I have asked the nurse to give her 2 more liters of IV fluids. I'm concerned about the possibility for sepsis with  a lower blood pressure, elevated wbc ct, and tachycardia, and elevated lactate.  I covered her with vanc and zosyn for undifferentiated possible sepsis.  I spoke with Dr. Lavetta Nielsen about admission.  CONSULTATIONS: Dr. Lavetta Nielsen, Hospitalist for admission.   Patient / Family / Caregiver informed of clinical course, medical decision-making process, and agree with plan.       ____________________________________________   FINAL CLINICAL IMPRESSION(S) / ED DIAGNOSES  Final diagnoses:  Pseudoseizure ([place])  Non-intractable vomiting with nausea, vomiting of unspecified type  Sepsis, unspecified organism ([facility])        Lisa Roca, MD 12/16/15  [phone]

## 2015-12-16 NOTE — ED Notes (Signed)
Seizure pads placed on bed

## 2015-12-16 NOTE — ED Triage Notes (Signed)
Pt arrived via ems for c/o seizure - ems reports that pt was post dictal on arrival to home and then began to have back to back seizures while in route to the er - they gave a total of 10mg  Valium - they also reported large amounts of vomit in the pt room that she was found in

## 2015-12-16 NOTE — ED Provider Notes (Signed)
[facility] Emergency Department Provider Note   ____________________________________________   First MD Initiated Contact with Patient 12/16/15 [phone]     (approximate)  I have reviewed the triage vital signs and the nursing notes.   HISTORY  Chief Complaint Seizures  Limited by drowsiness  HPI Rachael Gray is a 35 y.o. female brought to the ED from home via EMS with a chief complaint of seizures and vomiting.Patient is well-known to the emergency department for a history of pseudoseizures, nausea and vomiting. She is prescribed Keppra as a precautionary measure without documented evidence of true seizure activity but is noncompliant. EMS reports patient was postictal on arrival and then began to have shaking which they interpreted as seizures. She was given a total of 10 mg IV Valium en route. Further history is unobtainable due to patient's drowsiness.   Past Medical History:  Diagnosis Date  . Diabetes mellitus without complication ([place])   . Gastroparesis   . Lupus ([place])   . Pseudoseizures   . Seizures [facility])     Patient Active Problem List   Diagnosis Date Noted  . Intractable nausea and vomiting 12/10/2014  . Nausea & vomiting 12/10/2014  . Narcotic abuse 12/09/2014  . Sepsis ([place]) 12/07/2014  . Anxiety   . Pyelonephritis 12/04/2014  . Lactic acidosis 12/04/2014    Class: Acute    Past Surgical History:  Procedure Laterality Date  . APPENDECTOMY    . CHOLECYSTECTOMY      Prior to Admission medications   Medication Sig Start Date End Date Taking? Authorizing Provider  brompheniramine-pseudoephedrine-DM 30-2-10 MG/5ML syrup Take 10 mLs by mouth 4 (four) times daily as needed. 12/15/15   Jami L Hagler, PA-C  dicyclomine (BENTYL) 20 MG tablet Take 1 tablet (20 mg total) by mouth 3 (three) times daily as needed for spasms. Patient not taking: Reported on 12/15/2015 01/24/15   Carrie Mew, MD  fluticasone [facility]) 50 MCG/ACT nasal  spray Place 2 sprays into both nostrils daily. 12/15/15   Jami L Hagler, PA-C  insulin NPH-regular Human (NOVOLIN 70/30) (70-30) 100 UNIT/ML injection Inject 22-25 Units into the skin 2 (two) times daily with a meal. 25 units in the morning and 22 units in the evening    Historical Provider, MD  levETIRAcetam (KEPPRA) 750 MG tablet Take 1 tablet (750 mg total) by mouth 2 (two) times daily. 08/21/15   Paulette Blanch, MD  ondansetron (ZOFRAN) 4 MG tablet Take 1 tablet (4 mg total) by mouth daily as needed for nausea or vomiting. 12/15/15 12/22/15  Jami L Hagler, PA-C  oseltamivir (TAMIFLU) 75 MG capsule Take 1 capsule (75 mg total) by mouth 2 (two) times daily. 12/15/15   Jami L Hagler, PA-C  promethazine (PHENERGAN) 25 MG tablet Take 1 tablet (25 mg total) by mouth every 6 (six) hours as needed for nausea or vomiting. Patient not taking: Reported on 12/15/2015 06/27/15   Carrie Mew, MD    Allergies Doxycycline; Morphine and related; Reglan [metoclopramide]; and Tylenol [acetaminophen]  No family history on file.  Social History Social History  Substance Use Topics  . Smoking status: Former Smoker    Packs/day: 1.00    Types: Cigarettes  . Smokeless tobacco: Never Used  . Alcohol use Yes    Review of Systems  Constitutional: No fever/chills. Eyes: No visual changes. ENT: No sore throat. Cardiovascular: Denies chest pain. Respiratory: Denies shortness of breath. Gastrointestinal: No abdominal pain.  Positive for nausea and vomiting.  No diarrhea.  No constipation. Genitourinary: Negative for dysuria. Musculoskeletal: Negative for back pain. Skin: Negative for rash. Neurological: Positive for seizure-like activity. Negative for headaches, focal weakness or numbness.  Limited secondary to drowsiness; 10-point ROS otherwise negative.  ____________________________________________   PHYSICAL EXAM:  VITAL SIGNS: ED Triage Vitals  Enc Vitals Group     BP 12/16/15 0331 136/83      Pulse Rate 12/16/15 0331 (!) 112     Resp 12/16/15 0331 18     Temp 12/16/15 0331 97.9 F (36.6 C)     Temp Source 12/16/15 0331 Axillary     SpO2 12/16/15 0326 99 %     Weight 12/16/15 0331 150 lb (68 kg)     Height 12/16/15 0331 5\' 6"  (1.676 m)     Head Circumference --      Peak Flow --      Pain Score 12/16/15 0331 0     Pain Loc --      Pain Edu? --      Excl. in [place]? --     Constitutional: Asleep, awakened for exam. Drowsy. Well appearing and in no acute distress. Eyes: Conjunctivae are normal. PERRL. EOMI. Head: Atraumatic. Nose: No congestion/rhinnorhea. Mouth/Throat: Mucous membranes are moist.  Oropharynx non-erythematous.  Did not bite tongue. Neck: No stridor.  No cervical spine tenderness to palpation.  No step-offs or deformities. Cardiovascular: Normal rate, regular rhythm. Grossly normal heart sounds.  Good peripheral circulation. Respiratory: Normal respiratory effort.  No retractions. Lungs CTAB. Gastrointestinal: Soft and nontender. No distention. No abdominal bruits. No CVA tenderness. Musculoskeletal: No lower extremity tenderness nor edema.  No joint effusions. Neurologic:  Drowsy but arousable to voice. MAEx4. No gross focal neurologic deficits are appreciated.  Skin:  Skin is warm, dry and intact. No rash noted. No petechiae. Psychiatric: Mood and affect are normal. Speech and behavior are normal.  ____________________________________________   LABS (all labs ordered are listed, but only abnormal results are displayed)  Labs Reviewed  CBC WITH DIFFERENTIAL/PLATELET - Abnormal; Notable for the following:       Result Value   WBC 21.7 (*)    Neutro Abs 20.5 (*)    Lymphs Abs 0.7 (*)    All other components within normal limits  COMPREHENSIVE METABOLIC PANEL - Abnormal; Notable for the following:    CO2 20 (*)    Glucose, Bld 337 (*)    Total Protein 8.4 (*)    ALT 11 (*)    All other components within normal limits  URINALYSIS COMPLETEWITH  MICROSCOPIC ([facility] ONLY) - Abnormal; Notable for the following:    Color, Urine STRAW (*)    APPearance CLEAR (*)    Glucose, UA >500 (*)    Ketones, ur 2+ (*)    Hgb urine dipstick 1+ (*)    Protein, ur 100 (*)    Squamous Epithelial / LPF 0-5 (*)    All other components within normal limits  URINE DRUG SCREEN, QUALITATIVE ([facility] ONLY) - Abnormal; Notable for the following:    Cannabinoid 50 Ng, Ur [place] POSITIVE (*)    Benzodiazepine, Ur Scrn POSITIVE (*)    All other components within normal limits  LACTIC ACID, PLASMA - Abnormal; Notable for the following:    Lactic Acid, Venous 2.4 (*)    All other components within normal limits  BLOOD GAS, ARTERIAL - Abnormal; Notable for the following:    Bicarbonate 18.8 (*)    Acid-base deficit 5.9 (*)    All other components within  normal limits  ETHANOL  LIPASE, BLOOD  LACTIC ACID, PLASMA  LACTIC ACID, PLASMA  CBC WITH DIFFERENTIAL/PLATELET  PREGNANCY, URINE   ____________________________________________  EKG  None ____________________________________________  RADIOLOGY  None ____________________________________________   PROCEDURES  Procedure(s) performed: None  Procedures  Critical Care performed: No  ____________________________________________   INITIAL IMPRESSION / ASSESSMENT AND PLAN / ED COURSE  Pertinent labs & imaging results that were available during my care of the patient were reviewed by me and considered in my medical decision making (see chart for details).  35 year old female well-known to the emergency department for pseudoseizures and vomiting who presents for same. She received 10 mg IV Valium en route and is currently drowsy but arousable to voice and follows simple commands. Reports she is still nauseous. From my prior experience with this patient, she has good effect with IV Haldol for refractory nausea. Will give 5 mg in normal saline and monitor patient in the emergency department.  Review of  patient's chart reveals that she was seen in flex yesterday for URI type symptoms and prescribed Tamiflu. During her ED course she had pseudoseizure and was transferred to the major side for physician assessment. Noted patient's leukocytosis yesterday. Her urinalysis was unremarkable except for 2+ ketones, and UDS positive for cannabinoids. I question whether patient may be suffering from cannabinol hyperemesis syndrome. She is afebrile currently. No abdominal tenderness on exam; do not think patient requires CT imaging studies. Will add a chest x-ray to evaluate for pneumonia.  Clinical Course  Comment By Time  Discussed case with hospitalist; unclear whether leukocytosis and elevated lactate are secondary to infectious etiology or stress reaction from pseudoseizure and vomiting. No clear admission criteria currently. Patient asleep; has not vomited since being in the ED. Will repeat NS bolus and recheck WBC as well as lactate after completion of IV fluids. Paulette Blanch, MD 09/23 [phone]  Patient vomiting. Will administer Phenergan.  Paulette Blanch, MD 09/23 0559  Nausea improved; patient asking to drink. Will trial ice chips. Repeat CBC and lactate are pending. Care transferred to Dr. Reita Cliche. Disposition pending repeat lab results and if patient is able to tolerate PO. Paulette Blanch, MD 09/23 [phone]     ____________________________________________   FINAL CLINICAL IMPRESSION(S) / ED DIAGNOSES  Final diagnoses:  Pseudoseizure ([place])  Non-intractable vomiting with nausea, vomiting of unspecified type      NEW MEDICATIONS STARTED DURING THIS VISIT:  New Prescriptions   No medications on file     Note:  This document was prepared using Dragon voice recognition software and may include unintentional dictation errors.    Paulette Blanch, MD 12/16/15 [phone]

## 2015-12-16 NOTE — H&P (Signed)
Rachael Gray is an 35 y.o. female.   Chief Complaint: Nausea and vomiting HPI: Patient was in ED last night with N/V and pseudoseizure. Well known to ED staff. Treated and released. Represented in a fem hours with more N/V. Has history of gastroparesis.  Past Medical History:  Diagnosis Date  . Diabetes mellitus without complication ([place])   . Gastroparesis   . Lupus ([place])   . Pseudoseizures   . Seizures ([place])     Past Surgical History:  Procedure Laterality Date  . APPENDECTOMY    . CHOLECYSTECTOMY      No family history on file. Social History:  reports that she has quit smoking. Her smoking use included Cigarettes. She smoked 1.00 pack per day. She has never used smokeless tobacco. She reports that she drinks alcohol. She reports that she does not use drugs.  Allergies:  Allergies  Allergen Reactions  . Doxycycline Swelling  . Morphine And Related   . Reglan [Metoclopramide]   . Tylenol [Acetaminophen] Rash     (Not in a hospital admission)  Results for orders placed or performed during the hospital encounter of 12/16/15 (from the past 48 hour(s))  CBC with Differential     Status: Abnormal   Collection Time: 12/16/15  3:20 AM  Result Value Ref Range   WBC 21.7 (H) 3.6 - 11.0 K/uL   RBC 4.87 3.80 - 5.20 MIL/uL   Hemoglobin 14.2 12.0 - 16.0 g/dL   HCT 41.5 35.0 - 47.0 %   MCV 85.2 80.0 - 100.0 fL   MCH 29.1 26.0 - 34.0 pg   MCHC 34.1 32.0 - 36.0 g/dL   RDW 13.1 11.5 - 14.5 %   Platelets 212 150 - 440 K/uL   Neutrophils Relative % 95 %   Neutro Abs 20.5 (H) 1.4 - 6.5 K/uL   Lymphocytes Relative 3 %   Lymphs Abs 0.7 (L) 1.0 - 3.6 K/uL   Monocytes Relative 2 %   Monocytes Absolute 0.4 0.2 - 0.9 K/uL   Eosinophils Relative 0 %   Eosinophils Absolute 0.0 0 - 0.7 K/uL   Basophils Relative 0 %   Basophils Absolute 0.0 0 - 0.1 K/uL  Comprehensive metabolic panel     Status: Abnormal   Collection Time: 12/16/15  3:20 AM  Result Value Ref Range   Sodium 138  135 - 145 mmol/L   Potassium 3.7 3.5 - 5.1 mmol/L   Chloride 103 101 - 111 mmol/L   CO2 20 (L) 22 - 32 mmol/L   Glucose, Bld 337 (H) 65 - 99 mg/dL   BUN 14 6 - 20 mg/dL   Creatinine, Ser 0.65 0.44 - 1.00 mg/dL   Calcium 9.5 8.9 - 10.3 mg/dL   Total Protein 8.4 (H) 6.5 - 8.1 g/dL   Albumin 4.4 3.5 - 5.0 g/dL   AST 15 15 - 41 U/L   ALT 11 (L) 14 - 54 U/L   Alkaline Phosphatase 54 38 - 126 U/L   Total Bilirubin 0.9 0.3 - 1.2 mg/dL   GFR calc non Af Amer >60 >60 mL/min   GFR calc Af Amer >60 >60 mL/min    Comment: (NOTE) The eGFR has been calculated using the CKD EPI equation. This calculation has not been validated in all clinical situations. eGFR's persistently <60 mL/min signify possible Chronic Kidney Disease.    Anion gap 15 5 - 15  Ethanol     Status: None   Collection Time: 12/16/15  3:20 AM  Result  Value Ref Range   Alcohol, Ethyl (B) <5 <5 mg/dL    Comment:        LOWEST DETECTABLE LIMIT FOR SERUM ALCOHOL IS 5 mg/dL FOR MEDICAL PURPOSES ONLY   Lipase, blood     Status: None   Collection Time: 12/16/15  3:20 AM  Result Value Ref Range   Lipase 14 11 - 51 U/L  Lactic acid, plasma     Status: Abnormal   Collection Time: 12/16/15  4:58 AM  Result Value Ref Range   Lactic Acid, Venous 2.4 (HH) 0.5 - 1.9 mmol/L    Comment: CRITICAL RESULT CALLED TO, READ BACK BY AND VERIFIED WITH TERESA HUDSON AT 3419 12/16/15.PMH  Blood gas, arterial     Status: Abnormal   Collection Time: 12/16/15  5:16 AM  Result Value Ref Range   FIO2 0.21    pH, Arterial 7.35 7.350 - 7.450   pCO2 arterial 34 32.0 - 48.0 mmHg   pO2, Arterial 87 83.0 - 108.0 mmHg   Bicarbonate 18.8 (L) 20.0 - 28.0 mmol/L   Acid-base deficit 5.9 (H) 0.0 - 2.0 mmol/L   O2 Saturation 96.1 %   Patient temperature 37.0    Collection site RIGHT RADIAL    Sample type ARTERIAL DRAW    [place]ens test (pass/fail) PASS PASS  Urinalysis complete, with microscopic ([facility] only)     Status: Abnormal   Collection Time: 12/16/15   5:51 AM  Result Value Ref Range   Color, Urine STRAW (A) YELLOW   APPearance CLEAR (A) CLEAR   Glucose, UA >500 (A) NEGATIVE mg/dL   Bilirubin Urine NEGATIVE NEGATIVE   Ketones, ur 2+ (A) NEGATIVE mg/dL   Specific Gravity, Urine 1.029 1.005 - 1.030   Hgb urine dipstick 1+ (A) NEGATIVE   pH 5.0 5.0 - 8.0   Protein, ur 100 (A) NEGATIVE mg/dL   Nitrite NEGATIVE NEGATIVE   Leukocytes, UA NEGATIVE NEGATIVE   RBC / HPF 0-5 0 - 5 RBC/hpf   WBC, UA 0-5 0 - 5 WBC/hpf   Bacteria, UA NONE SEEN NONE SEEN   Squamous Epithelial / LPF 0-5 (A) NONE SEEN  Urine Drug Screen, Qualitative ([facility] only)     Status: Abnormal   Collection Time: 12/16/15  5:51 AM  Result Value Ref Range   Tricyclic, Ur Screen NONE DETECTED NONE DETECTED   Amphetamines, Ur Screen NONE DETECTED NONE DETECTED   MDMA (Ecstasy)Ur Screen NONE DETECTED NONE DETECTED   Cocaine Metabolite,Ur [place] NONE DETECTED NONE DETECTED   Opiate, Ur Screen NONE DETECTED NONE DETECTED   Phencyclidine (PCP) Ur S NONE DETECTED NONE DETECTED   Cannabinoid 50 Ng, Ur [place] POSITIVE (A) NONE DETECTED   Barbiturates, Ur Screen NONE DETECTED NONE DETECTED   Benzodiazepine, Ur Scrn POSITIVE (A) NONE DETECTED   Methadone Scn, Ur NONE DETECTED NONE DETECTED    Comment: (NOTE) 379  Tricyclics, urine               Cutoff 1000 ng/mL 200  Amphetamines, urine             Cutoff 1000 ng/mL 300  MDMA (Ecstasy), urine           Cutoff 500 ng/mL 400  Cocaine Metabolite, urine       Cutoff 300 ng/mL 500  Opiate, urine                   Cutoff 300 ng/mL 600  Phencyclidine (PCP), urine      Cutoff 25  ng/mL 700  Cannabinoid, urine              Cutoff 50 ng/mL 800  Barbiturates, urine             Cutoff 200 ng/mL 900  Benzodiazepine, urine           Cutoff 200 ng/mL 1000 Methadone, urine                Cutoff 300 ng/mL 1100 1200 The urine drug screen provides only a preliminary, unconfirmed 1300 analytical test result and should not be used for non-medical 1400  purposes. Clinical consideration and professional judgment should 1500 be applied to any positive drug screen result due to possible 1600 interfering substances. A more specific alternate chemical method 1700 must be used in order to obtain a confirmed analytical result.  1800 Gas chromato graphy / mass spectrometry (GC/MS) is the preferred 1900 confirmatory method.   Lactic acid, plasma     Status: Abnormal   Collection Time: 12/16/15  7:23 AM  Result Value Ref Range   Lactic Acid, Venous 2.4 (HH) 0.5 - 1.9 mmol/L    Comment: CRITICAL RESULT CALLED TO, READ BACK BY AND VERIFIED WITH ALICIA STAROPOLI ON 1/93/79 AT 0759 BY [facility]   CBC with Differential     Status: Abnormal   Collection Time: 12/16/15  7:23 AM  Result Value Ref Range   WBC 20.3 (H) 3.6 - 11.0 K/uL   RBC 4.51 3.80 - 5.20 MIL/uL   Hemoglobin 12.7 12.0 - 16.0 g/dL   HCT 39.3 35.0 - 47.0 %   MCV 87.2 80.0 - 100.0 fL   MCH 28.2 26.0 - 34.0 pg   MCHC 32.3 32.0 - 36.0 g/dL   RDW 13.0 11.5 - 14.5 %   Platelets 207 150 - 440 K/uL   Neutrophils Relative % 92 %   Neutro Abs 18.7 (H) 1.4 - 6.5 K/uL   Lymphocytes Relative 5 %   Lymphs Abs 0.9 (L) 1.0 - 3.6 K/uL   Monocytes Relative 3 %   Monocytes Absolute 0.6 0.2 - 0.9 K/uL   Eosinophils Relative 0 %   Eosinophils Absolute 0.0 0 - 0.7 K/uL   Basophils Relative 0 %   Basophils Absolute 0.1 0 - 0.1 K/uL   Dg Chest Port 1 View  Result Date: 12/16/2015 CLINICAL DATA:  Cough and congestion for 2 days. EXAM: PORTABLE CHEST 1 VIEW COMPARISON:  06/27/2015 FINDINGS: The heart size and mediastinal contours are within normal limits. Both lungs are clear. The visualized skeletal structures are unremarkable. IMPRESSION: No active disease. Electronically Signed   By: Lucienne Capers M.D.   On: 12/16/2015 05:21    Review of Systems  Constitutional: Negative for chills and fever.  HENT: Negative for hearing loss.   Eyes: Negative for blurred vision.  Respiratory: Negative for cough.    Cardiovascular: Negative for chest pain.  Gastrointestinal: Positive for nausea and vomiting.  Genitourinary: Negative for dysuria.  Musculoskeletal: Negative for joint pain.  Skin: Negative for rash.  Neurological: Negative for dizziness.    Blood pressure (!) 143/69, pulse 98, temperature 97.9 F (36.6 C), temperature source Axillary, resp. rate 16, height 5' 6"  (1.676 m), weight 68 kg (150 lb), last menstrual period 09/07/2015, SpO2 100 %. Physical Exam  Constitutional: She is oriented to person, place, and time. She appears well-developed and well-nourished. No distress.  HENT:  Head: Normocephalic and atraumatic.  Dry oral mucosa  Eyes: Pupils are equal, round, and reactive to light.  Neck: No JVD present. No thyromegaly present.  Cardiovascular: Normal rate and regular rhythm.   No murmur heard. Respiratory: Effort normal. No respiratory distress.  GI: Soft. Bowel sounds are normal.  Mild epigastric tenderness. No rebound or guarding.  Musculoskeletal: She exhibits no edema.  Neurological: She is alert and oriented to person, place, and time.  Skin: Skin is warm. No rash noted.     Assessment/Plan 1. Intractable N/V: Will give IV antiemetics. Start IV reglan for gastroparesis. Hydrate aggressively. Clear liquid diet.  2. Leukocytosis: Likely reactionary from N/V. Do not suspect sepsis at this point so will not continue abx started in ED.  3. Pseudoseizure: It is unclear frompast records if she has a true seizure disorder. However, she is on Keppra so will continue for now.  4. DM: SSI for now until taking PO better.   Time spent: 45 min  Baxter Hire, MD 12/16/2015, 8:42 AM

## 2015-12-16 NOTE — ED Notes (Signed)
CBC and lactic to be drawn after 106ml bolus infuses

## 2015-12-16 NOTE — ED Notes (Signed)
Pt arrived via ems for c/o seizure - ems reports that pt was post dictal on arrival to home and then began to have back to back seizures while in route to the er - they gave a total of 10mg  Valium - they also reported large amounts of vomit in the pt room that she was found in

## 2015-12-16 NOTE — ED Notes (Signed)
Called RT to draw ABG  

## 2015-12-17 LAB — CBC
HEMATOCRIT: 35.5 % (ref 35.0–47.0)
HEMOGLOBIN: 12 g/dL (ref 12.0–16.0)
MCH: 29 pg (ref 26.0–34.0)
MCHC: 33.8 g/dL (ref 32.0–36.0)
MCV: 85.8 fL (ref 80.0–100.0)
Platelets: 195 10*3/uL (ref 150–440)
RBC: 4.14 MIL/uL (ref 3.80–5.20)
RDW: 13 % (ref 11.5–14.5)
WBC: 18.1 10*3/uL — ABNORMAL HIGH (ref 3.6–11.0)

## 2015-12-17 LAB — GLUCOSE, CAPILLARY
GLUCOSE-CAPILLARY: 219 mg/dL — AB (ref 65–99)
GLUCOSE-CAPILLARY: 108 mg/dL — AB (ref 65–99)
GLUCOSE-CAPILLARY: 172 mg/dL — AB (ref 65–99)
Glucose-Capillary: 150 mg/dL — ABNORMAL HIGH (ref 65–99)

## 2015-12-17 MED ORDER — HYDROMORPHONE HCL 1 MG/ML IJ SOLN
0.5000 mg | INTRAMUSCULAR | Status: DC | PRN
  Administered 2015-12-17 – 2015-12-18 (×7): 0.5 mg via INTRAVENOUS
  Filled 2015-12-17 (×7): qty 1

## 2015-12-17 NOTE — Progress Notes (Signed)
Subjective: C/O epigastric burning pain. Continues to vomit after taking CLD but says it is less than yesterday.  Objective: Vital signs in last 24 hours: Temp:  [98.2 F (36.8 C)-98.4 F (36.9 C)] 98.2 F (36.8 C) (09/24 0720) Pulse Rate:  [78-109] 90 (09/24 0720) Resp:  [18-19] 18 (09/24 0720) BP: (106-155)/(58-83) 136/66 (09/24 0720) SpO2:  [92 %-100 %] 100 % (09/24 0720) Weight change:     Intake/Output from previous day: 09/23 0701 - 09/24 0700 In: 1170 [P.O.:480; I.V.:690] Out: 351 [Emesis/NG output:351] Intake/Output this shift: No intake/output data recorded.  General appearance: no distress Resp: clear to auscultation bilaterally and normal percussion bilaterally Cardio: regular rate and rhythm, S1, S2 normal, no murmur, click, rub or gallop GI: Normal BS. Mild tenderness in epigastric area with no rebouind or guarding. Neurologic: Grossly normal  Lab Results:  Recent Labs  12/16/15 0723 12/17/15 0323  WBC 20.3* 18.1*  HGB 12.7 12.0  HCT 39.3 35.5  PLT 207 195   BMET  Recent Labs  12/15/15 1507 12/16/15 0320  NA 136 138  K 3.3* 3.7  CL 104 103  CO2 19* 20*  GLUCOSE 283* 337*  BUN 8 14  CREATININE 0.54 0.65  CALCIUM 9.3 9.5    Studies/Results: Dg Chest Port 1 View  Result Date: 12/16/2015 CLINICAL DATA:  Cough and congestion for 2 days. EXAM: PORTABLE CHEST 1 VIEW COMPARISON:  06/27/2015 FINDINGS: The heart size and mediastinal contours are within normal limits. Both lungs are clear. The visualized skeletal structures are unremarkable. IMPRESSION: No active disease. Electronically Signed   By: Lucienne Capers M.D.   On: 12/16/2015 05:21    Medications: I have reviewed the patient's current medications.  Assessment/Plan: 1. Intractable N/V: On antiemetics, IV reglan. Barely tolerating CLD so will not advance. Wants pain meds for abdominal discomfort. Discussed with her about pain meds and how that can worsen gastroparesis.   2. Leukocytosis:  Likely reactionary from N/V. Do not suspect sepsis at this point so will not continue abx started in ED.  3. Pseudoseizure: It is unclear frompast records if she has a true seizure disorder. However, she is on Keppra so will continue for now. Episode reported last night but see was awake and communicating through the entire event.  4. DM: SSI for now until taking PO better.  LOS: 0 days   Rachael Gray 12/17/2015, 8:43 AM

## 2015-12-17 NOTE — Progress Notes (Signed)
Pt had frequent episodes of vomiting at beginning of shift. Pt c/o abd pain. Order for phenergan x1 obtained. Pt had a few episodes of vomiting after the phenergan was given but there was significant decrease in vomiting throughout the remainder of the shift. Pt began displaying pseudo seizure behaviors approx 3am throughout the remainder of the morning. Pt displayed upper body rocking and shaking of her extremities. Pt was able to communicate with nurse during these episodes and request juice, etc.

## 2015-12-18 LAB — GLUCOSE, CAPILLARY
Glucose-Capillary: 153 mg/dL — ABNORMAL HIGH (ref 65–99)
Glucose-Capillary: 226 mg/dL — ABNORMAL HIGH (ref 65–99)

## 2015-12-18 MED ORDER — INSULIN NPH ISOPHANE & REGULAR (70-30) 100 UNIT/ML ~~LOC~~ SUSP: 22.0000 [IU] | Freq: Two times a day (BID) | SUBCUTANEOUS | 2 refills | Status: DC

## 2015-12-18 MED ORDER — DICYCLOMINE HCL 20 MG PO TABS: 20.0000 mg | ORAL_TABLET | Freq: Three times a day (TID) | ORAL | 0 refills | Status: DC | PRN

## 2015-12-18 MED ORDER — PROMETHAZINE HCL 25 MG PO TABS: 25.0000 mg | ORAL_TABLET | Freq: Four times a day (QID) | ORAL | 0 refills | Status: DC | PRN

## 2015-12-18 NOTE — Discharge Summary (Signed)
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 Diabetes mellitus without complication ([place])   . Gastroparesis   . Lupus ([place])   . Pseudoseizures   . Seizures [facility])    HOSPITAL COURSE:  Patient was admitted on 12/16/15 with diagnosis of intractable N/V with history of diabetic gastroparesis and pseudoseizure.  She was placed on IV fluids, Reglan, antiemetics, and a clear liquid diet.  She received GI cocktail and Dilaudid for abdominal pain.  She sustained a pseudoseizure on her second hospital night. Vomiting improved and patient was able to tolerate the majority of a full liquid lunch on the day of discharge.  Pain was well controlled and she was eager to go home.    DISCHARGE CONDITIONS:  Stable CONSULTS OBTAINED:  None.  DRUG ALLERGIES:   Allergies  Allergen Reactions  . Doxycycline Swelling  . Morphine And Related   . Tylenol [Acetaminophen] Rash   DISCHARGE MEDICATIONS:  Per med rec.     DISCHARGE INSTRUCTIONS:  Establish primary medical care with [facility].  DIET:  Advance diet slowly as tolerated.  DISCHARGE CONDITION:  Stable ACTIVITY:  As tolerated.  OXYGEN:  N/A DISCHARGE LOCATION:  Home  If you experience worsening of your admission symptoms, develop shortness of breath, life threatening emergency, suicidal or homicidal thoughts you must seek medical attention immediately by calling 911 or calling your MD immediately  if  symptoms less severe.  You Must read complete instructions/literature along with all the possible adverse reactions/side effects for all the Medicines you take and that have been prescribed to you. Take any new Medicines after you have completely understood and accpet all the possible adverse reactions/side effects.   Please note  You were cared for by a hospitalist during your hospital stay. If you have any questions about your discharge medications or the care you received while you were in the hospital after you are discharged, you can call the unit and asked to speak with the hospitalist on call if the hospitalist that took care of you is not available. Once you are discharged, your primary care physician will handle any further medical issues. Please note that NO REFILLS for any discharge medications will be authorized once you are discharged, as it is imperative that you return to your primary care physician (or establish a relationship with a primary care physician if you do not have one) for your aftercare needs so that they can reassess your need for medications and monitor your lab values.    On the day of Discharge:  VITAL SIGNS:  Blood pressure 117/70, pulse 79, temperature 98.5 F (36.9 C), temperature source Oral, resp. rate 18, height 5\' 6"  (1.676 m), weight 68 kg (150 lb), last menstrual period 09/07/2015, SpO2 100 %. PHYSICAL EXAMINATION:  GENERAL:  35 y.o.-year-old patient lying in the bed with no acute distress.  EYES: Pupils equal, round, reactive to light and accommodation. No scleral icterus. Extraocular muscles intact.  HEENT: Head atraumatic, normocephalic. Oropharynx and nasopharynx clear.  NECK:  Supple, no jugular venous distention. No thyroid enlargement, no tenderness.  LUNGS: Normal breath sounds bilaterally, no wheezing, rales,rhonchi or crepitation. No use of accessory muscles of respiration.  CARDIOVASCULAR: S1, S2 normal. No murmurs, rubs, or gallops.  ABDOMEN:  Soft, non-tender, non-distended. Bowel sounds present. No organomegaly or mass.  EXTREMITIES: No pedal edema, cyanosis, or clubbing.  NEUROLOGIC: Cranial nerves II through XII are intact. Muscle strength 5/5 in all extremities. Sensation intact. Gait not checked.  PSYCHIATRIC: The patient is alert and oriented x 3.  SKIN: No obvious rash, lesion, or ulcer.  DATA REVIEW:   CBC  Recent Labs Lab 12/17/15 0323  WBC 18.1*  HGB 12.0  HCT 35.5  PLT 195    Chemistries   Recent Labs Lab 12/16/15 0320  NA 138  K 3.7  CL 103  CO2 20*  GLUCOSE 337*  BUN 14  CREATININE 0.65  CALCIUM 9.5  AST 15  ALT 11*  ALKPHOS 92  BILITOT 0.9     Microbiology Results  Results for orders placed or performed during the hospital encounter of 12/16/15  Culture, blood (routine x 2)     Status: None (Preliminary result)   Collection Time: 12/16/15  8:30 AM  Result Value Ref Range Status   Specimen Description BLOOD LEFT ANTECUBITAL  Final   Special Requests   Final    BOTTLES DRAWN AEROBIC AND ANAEROBIC  AER 5CC ANA 3CC   Culture NO GROWTH < 24 HOURS  Final   Report Status PENDING  Incomplete  Culture, blood (routine x 2)     Status: None (Preliminary result)   Collection Time: 12/16/15  8:31 AM  Result Value Ref Range Status   Specimen Description BLOOD LEFT ARM  Final   Special Requests BOTTLES DRAWN AEROBIC AND ANAEROBIC  3CC  Final   Culture NO GROWTH < 24 HOURS  Final   Report Status PENDING  Incomplete    RADIOLOGY:  No results found.   Management plans discussed with the patient, family and they are in agreement.  CODE STATUS:     Code Status Orders        Start     Ordered   12/16/15 1004  Full code  Continuous     12/16/15 1003    Code Status History    Date Active Date Inactive Code Status Order ID Comments User Context   12/10/2014  6:01 PM 12/11/2014  2:54 PM Full Code [id number]  Idelle Crouch, MD Inpatient   12/04/2014  8:23 PM 12/07/2014  3:32 PM Full Code  [id number]  Demetrios Loll, MD Inpatient      TOTAL TIME TAKING CARE OF THIS PATIENT: 25 minutes.    Harvie Bridge M.D on 12/18/2015 at 4:28 PM  Between 7am to 6pm - Pager - [phone]  After 6pm go to [email or web address] - [employer] [facility]  Office  [phone]  CC: Primary care physician; No PCP Per Patient   Note: This dictation was prepared with Dragon dictation along with smaller phrase technology. Any transcriptional errors that result from this process are unintentional.

## 2015-12-18 NOTE — Progress Notes (Signed)
Patient was discharged home. 2 IVs removed with caths intact. Reviewed last dose of med was given. Allowed time for questions.

## 2015-12-18 NOTE — Care Management (Signed)
RNCM met with patient at bedside. She states she has no insurance or PCP. Has applied for medicaid but failed to complete all the needed paperwork. Encouraged her to complete the medicaid process. Application given for [facility] and [place]. Patient had no further concerns or needs identified.

## 2015-12-19 ENCOUNTER — Emergency Department
Admission: EM | Admit: 2015-12-19 | Discharge: 2015-12-19 | Disposition: A | Discharge status: Home / Self Care | Payer: Self-pay | Attending: Emergency Medicine | Admitting: Emergency Medicine

## 2015-12-19 ENCOUNTER — Encounter: Payer: Self-pay | Admitting: Emergency Medicine

## 2015-12-19 DIAGNOSIS — Z87891 Personal history of nicotine dependence: Secondary | ICD-10-CM | POA: Insufficient documentation

## 2015-12-19 DIAGNOSIS — E1143 Type 2 diabetes mellitus with diabetic autonomic (poly)neuropathy: Secondary | ICD-10-CM | POA: Insufficient documentation

## 2015-12-19 DIAGNOSIS — Z794 Long term (current) use of insulin: Secondary | ICD-10-CM | POA: Insufficient documentation

## 2015-12-19 DIAGNOSIS — K3184 Gastroparesis: Secondary | ICD-10-CM

## 2015-12-19 DIAGNOSIS — Z79899 Other long term (current) drug therapy: Secondary | ICD-10-CM | POA: Insufficient documentation

## 2015-12-19 LAB — LIPASE, BLOOD: Lipase: 32 U/L (ref 11–51)

## 2015-12-19 LAB — CBC WITH DIFFERENTIAL/PLATELET
BASOS ABS: 0.1 10*3/uL (ref 0–0.1)
BASOS PCT: 1 %
EOS ABS: 0.1 10*3/uL (ref 0–0.7)
Eosinophils Relative: 1 %
HEMATOCRIT: 38.8 % (ref 35.0–47.0)
HEMOGLOBIN: 13 g/dL (ref 12.0–16.0)
Lymphocytes Relative: 37 %
Lymphs Abs: 3.2 10*3/uL (ref 1.0–3.6)
MCH: 28.4 pg (ref 26.0–34.0)
MCHC: 33.5 g/dL (ref 32.0–36.0)
MCV: 84.6 fL (ref 80.0–100.0)
MONOS PCT: 8 %
Monocytes Absolute: 0.7 10*3/uL (ref 0.2–0.9)
NEUTROS ABS: 4.5 10*3/uL (ref 1.4–6.5)
NEUTROS PCT: 53 %
Platelets: 221 10*3/uL (ref 150–440)
RBC: 4.58 MIL/uL (ref 3.80–5.20)
RDW: 12.8 % (ref 11.5–14.5)
WBC: 8.5 10*3/uL (ref 3.6–11.0)

## 2015-12-19 LAB — COMPREHENSIVE METABOLIC PANEL
ALBUMIN: 3.8 g/dL (ref 3.5–5.0)
ALK PHOS: 39 U/L (ref 38–126)
ALT: 12 U/L — ABNORMAL LOW (ref 14–54)
AST: 19 U/L (ref 15–41)
Anion gap: 10 (ref 5–15)
CO2: 24 mmol/L (ref 22–32)
Calcium: 8.7 mg/dL — ABNORMAL LOW (ref 8.9–10.3)
Chloride: 105 mmol/L (ref 101–111)
Creatinine, Ser: 0.51 mg/dL (ref 0.44–1.00)
GFR calc Af Amer: >60 mL/min (ref >60)
GFR calc non Af Amer: >60 mL/min (ref >60)
GLUCOSE: 280 mg/dL — AB (ref 65–99)
POTASSIUM: 3.2 mmol/L — AB (ref 3.5–5.1)
SODIUM: 139 mmol/L (ref 135–145)
Total Bilirubin: 0.7 mg/dL (ref 0.3–1.2)
Total Protein: 6.9 g/dL (ref 6.5–8.1)

## 2015-12-19 LAB — URINALYSIS COMPLETE WITH MICROSCOPIC (ARMC ONLY)
BACTERIA UA: NONE SEEN
Bilirubin Urine: NEGATIVE
Glucose, UA: >500 mg/dL — AB
LEUKOCYTES UA: NEGATIVE
NITRITE: NEGATIVE
PH: 7 (ref 5.0–8.0)
PROTEIN: NEGATIVE mg/dL
SPECIFIC GRAVITY, URINE: 1.016 (ref 1.005–1.030)

## 2015-12-19 LAB — HCG, QUANTITATIVE, PREGNANCY

## 2015-12-19 MED ORDER — SODIUM CHLORIDE 0.9 % IV SOLN
1500.0000 mg | Freq: Once | INTRAVENOUS | Status: DC
  Filled 2015-12-19: qty 15

## 2015-12-19 MED ORDER — HYDROMORPHONE HCL 1 MG/ML IJ SOLN
INTRAMUSCULAR | Status: AC
  Administered 2015-12-19: 1 mg via INTRAVENOUS
  Filled 2015-12-19: qty 1

## 2015-12-19 MED ORDER — HALOPERIDOL LACTATE 5 MG/ML IJ SOLN
5.0000 mg | Freq: Once | INTRAMUSCULAR | Status: AC
  Administered 2015-12-19: 5 mg via INTRAVENOUS
  Filled 2015-12-19: qty 1

## 2015-12-19 MED ORDER — ONDANSETRON HCL 4 MG/2ML IJ SOLN
4.0000 mg | Freq: Once | INTRAMUSCULAR | Status: AC
  Administered 2015-12-19: 4 mg via INTRAVENOUS

## 2015-12-19 MED ORDER — ONDANSETRON 4 MG PO TBDP: 4.0000 mg | ORAL_TABLET | Freq: Three times a day (TID) | ORAL | 0 refills | Status: DC | PRN

## 2015-12-19 MED ORDER — HYDROMORPHONE HCL 1 MG/ML IJ SOLN
1.0000 mg | Freq: Once | INTRAMUSCULAR | Status: AC
  Administered 2015-12-19: 1 mg via INTRAVENOUS

## 2015-12-19 MED ORDER — SODIUM CHLORIDE 0.9 % IV BOLUS (SEPSIS)
1000.0000 mL | Freq: Once | INTRAVENOUS | Status: AC
  Administered 2015-12-19: 1000 mL via INTRAVENOUS

## 2015-12-19 MED ORDER — ONDANSETRON HCL 4 MG/2ML IJ SOLN
INTRAMUSCULAR | Status: AC
  Administered 2015-12-19: 4 mg via INTRAVENOUS
  Filled 2015-12-19: qty 2

## 2015-12-19 NOTE — Discharge Instructions (Signed)

## 2015-12-19 NOTE — ED Triage Notes (Signed)
Pt ems from home for seizure x 2?. Pt jerking arms and legs on arrival but able to shake head yes or no   to questions. Pt with nausea and emesis on arrival.

## 2015-12-19 NOTE — ED Notes (Signed)
Pt mother called and was informed pt is being discharged, mother states will pick up patient.

## 2015-12-19 NOTE — ED Provider Notes (Signed)
[facility] Emergency Department Provider Note  ____________________________________________  Time seen: Approximately 5:27 PM  I have reviewed the triage vital signs and the nursing notes.   HISTORY  Chief Complaint Seizures   HPI Rachael Gray is a 35 y.o. female the history of pseudoseizures, diabetes, gastroparesis, lupus who presents for evaluation of seizure. According to EMS patient had 6 back-to-back tonic clonic seizures today with no return to baseline. Patient received 5 mg of IV Valium per EMS which resulted in resolution of the seizure. Patient reports that she has had 3 days of nausea and vomiting. She has been taking her Keppra but she is afraid that she is vomiting all her pills. Review of EPIC shows that patient has h/o pseudoseizures and is prescribed Keppra as a precaution. No h/o of true epilepsy. She reports that her abdominal pain and vomiting are identical to prior episodes of gastroparesis. She denies chest pain or shortness of breath, fever or chills, urinary symptoms, diarrhea. Pain is epigastric, severe, constant, non radiating. Patient was discharged from [place] yesterday when she was admitted for gastroparesis.   Past Medical History:  Diagnosis Date  . Diabetes mellitus without complication ([place])   . Gastroparesis   . Lupus ([place])   . Pseudoseizures   . Seizures [facility])     Patient Active Problem List   Diagnosis Date Noted  . Vomiting 12/16/2015  . Intractable nausea and vomiting 12/10/2014  . Nausea & vomiting 12/10/2014  . Narcotic abuse 12/09/2014  . Sepsis ([place]) 12/07/2014  . Anxiety   . Pyelonephritis 12/04/2014  . Lactic acidosis 12/04/2014    Class: Acute    Past Surgical History:  Procedure Laterality Date  . APPENDECTOMY    . CHOLECYSTECTOMY      Prior to Admission medications   Medication Sig Start Date End Date Taking? Authorizing Provider  brompheniramine-pseudoephedrine-DM 30-2-10 MG/5ML syrup  Take 10 mLs by mouth 4 (four) times daily as needed. 12/15/15  Yes Jami L Hagler, PA-C  dicyclomine (BENTYL) 20 MG tablet Take 1 tablet (20 mg total) by mouth 3 (three) times daily as needed for spasms. 12/18/15  Yes Alexis Hugelmeyer, DO  fluticasone (FLONASE) 50 MCG/ACT nasal spray Place 2 sprays into both nostrils daily. 12/15/15  Yes Jami L Hagler, PA-C  insulin NPH-regular Human (NOVOLIN 70/30) (70-30) 100 UNIT/ML injection Inject 22-25 Units into the skin 2 (two) times daily with a meal. 25 units in the morning and 22 units in the evening 12/18/15  Yes Alexis Hugelmeyer, DO  levETIRAcetam (KEPPRA) 750 MG tablet Take 1 tablet (750 mg total) by mouth 2 (two) times daily. 08/21/15  Yes Paulette Blanch, MD  ondansetron (ZOFRAN) 4 MG tablet Take 1 tablet (4 mg total) by mouth daily as needed for nausea or vomiting. 12/15/15 12/22/15 Yes Jami L Hagler, PA-C  promethazine (PHENERGAN) 25 MG tablet Take 1 tablet (25 mg total) by mouth every 6 (six) hours as needed for nausea or vomiting. 12/18/15  Yes Alexis Hugelmeyer, DO  ondansetron (ZOFRAN ODT) 4 MG disintegrating tablet Take 1 tablet (4 mg total) by mouth every 8 (eight) hours as needed for nausea or vomiting. 12/19/15   Rudene Re, MD    Allergies Doxycycline; Morphine and related; and Tylenol [acetaminophen]  History reviewed. No pertinent family history.  Social History Social History  Substance Use Topics  . Smoking status: Former Smoker    Packs/day: 1.00    Types: Cigarettes  . Smokeless tobacco: Never Used  . Alcohol use  Yes    Review of Systems  Constitutional: Negative for fever. Eyes: Negative for visual changes. ENT: Negative for sore throat. Cardiovascular: Negative for chest pain. Respiratory: Negative for shortness of breath. Gastrointestinal: + epigastric abdominal pain, nausea, and vomiting. No diarrhea. Genitourinary: Negative for dysuria. Musculoskeletal: Negative for back pain. Skin: Negative for rash. Neurological:  Negative for headaches, weakness or numbness. + seizure  ____________________________________________   PHYSICAL EXAM:  VITAL SIGNS: Vitals:   12/19/15 1830 12/19/15 1930  BP: (!) 143/76 (!) 149/88  Pulse: 72 83  Resp: 15 15  Temp:     Constitutional: Alert and oriented, shaking uncontrollably, actively vomiting. HEENT:      Head: Normocephalic and atraumatic.         Eyes: Conjunctivae are normal. Sclera is non-icteric. EOMI. PERRL      Mouth/Throat: Mucous membranes are moist.       Neck: Supple with no signs of meningismus. Cardiovascular: Regular rate and rhythm. No murmurs, gallops, or rubs. 2+ symmetrical distal pulses are present in all extremities. No JVD. Respiratory: Normal respiratory effort. Lungs are clear to auscultation bilaterally. No wheezes, crackles, or rhonchi.  Gastrointestinal: Soft, ttp over the epigastric region, non distended with positive bowel sounds. No rebound or guarding. Genitourinary: No CVA tenderness. Musculoskeletal: Nontender with normal range of motion in all extremities. No edema, cyanosis, or erythema of extremities. Neurologic:  Shaking but answers to questions appropriately, moving all extremities. Skin: Skin is warm, dry and intact. No rash noted.  ____________________________________________   LABS (all labs ordered are listed, but only abnormal results are displayed)  Labs Reviewed  COMPREHENSIVE METABOLIC PANEL - Abnormal; Notable for the following:       Result Value   Potassium 3.2 (*)    Glucose, Bld 280 (*)    BUN <5 (*)    Calcium 8.7 (*)    ALT 12 (*)    All other components within normal limits  URINALYSIS COMPLETEWITH MICROSCOPIC ([facility] ONLY) - Abnormal; Notable for the following:    Color, Urine STRAW (*)    APPearance CLEAR (*)    Glucose, UA >500 (*)    Ketones, ur TRACE (*)    Hgb urine dipstick 1+ (*)    Squamous Epithelial / LPF 0-5 (*)    All other components within normal limits  CBC WITH  DIFFERENTIAL/PLATELET  LIPASE, BLOOD  HCG, QUANTITATIVE, PREGNANCY   ____________________________________________  EKG  none ____________________________________________  RADIOLOGY  none  ____________________________________________   PROCEDURES  Procedure(s) performed: None Procedures Critical Care performed:  None ____________________________________________   INITIAL IMPRESSION / ASSESSMENT AND PLAN / ED COURSE   35 y.o. female the history of pseudoseizures, diabetes, gastroparesis, lupus who presents for evaluation of seizure in the setting of abdominal pain and multiple episodes of vomiting. Patient was discharged from the hospital yesterday when she was admitted for gastroparesis. Patient received 5 mg of Valium per EMS. Patient arrives shaking uncontrollably but answering to questions appropriate, moving all extremities, she is actively vomiting and has tenderness to palpation in her epigastric region, remaining of her abdominal exam is within normal limits. Will give IVF, IV dilaudid for pain, IV zofran and 5mg  of IV haldol mixed with NS.  Clinical Course  Comment By Time  Patient feels markedly improved. Requesting to be discharged at this time. No seizures in the ED. Abdominal exam with no ttp. We'll discharge patient home with a prescription for Zofran for nausea and vomiting. Labs here with no acute findings. Rudene Re, MD  09/26 1938    Pertinent labs & imaging results that were available during my care of the patient were reviewed by me and considered in my medical decision making (see chart for details).    ____________________________________________   FINAL CLINICAL IMPRESSION(S) / ED DIAGNOSES  Final diagnoses:  Gastroparesis      NEW MEDICATIONS STARTED DURING THIS VISIT:  New Prescriptions   ONDANSETRON (ZOFRAN ODT) 4 MG DISINTEGRATING TABLET    Take 1 tablet (4 mg total) by mouth every 8 (eight) hours as needed for nausea or vomiting.      Note:  This document was prepared using Dragon voice recognition software and may include unintentional dictation errors.    Rudene Re, MD 12/19/15 [phone]

## 2015-12-19 NOTE — ED Notes (Signed)

## 2015-12-21 LAB — CULTURE, BLOOD (ROUTINE X 2)
CULTURE: NO GROWTH
Culture: NO GROWTH

## 2015-12-22 ENCOUNTER — Emergency Department: Payer: Self-pay

## 2015-12-22 ENCOUNTER — Encounter: Payer: Self-pay | Admitting: Emergency Medicine

## 2015-12-22 ENCOUNTER — Inpatient Hospital Stay
Admission: EM | Admit: 2015-12-22 | Discharge: 2015-12-24 | DRG: 074 | Disposition: A | Discharge status: Home / Self Care | Payer: Self-pay | Attending: Internal Medicine | Admitting: Internal Medicine

## 2015-12-22 DIAGNOSIS — R112 Nausea with vomiting, unspecified: Secondary | ICD-10-CM | POA: Diagnosis present

## 2015-12-22 DIAGNOSIS — E860 Dehydration: Secondary | ICD-10-CM | POA: Diagnosis present

## 2015-12-22 DIAGNOSIS — E872 Acidosis, unspecified: Secondary | ICD-10-CM

## 2015-12-22 DIAGNOSIS — E1143 Type 2 diabetes mellitus with diabetic autonomic (poly)neuropathy: Principal | ICD-10-CM

## 2015-12-22 DIAGNOSIS — E874 Mixed disorder of acid-base balance: Secondary | ICD-10-CM | POA: Diagnosis present

## 2015-12-22 DIAGNOSIS — K3184 Gastroparesis: Secondary | ICD-10-CM | POA: Diagnosis present

## 2015-12-22 DIAGNOSIS — Z881 Allergy status to other antibiotic agents status: Secondary | ICD-10-CM

## 2015-12-22 DIAGNOSIS — E119 Type 2 diabetes mellitus without complications: Secondary | ICD-10-CM

## 2015-12-22 DIAGNOSIS — E876 Hypokalemia: Secondary | ICD-10-CM

## 2015-12-22 DIAGNOSIS — Z87891 Personal history of nicotine dependence: Secondary | ICD-10-CM

## 2015-12-22 DIAGNOSIS — Z79899 Other long term (current) drug therapy: Secondary | ICD-10-CM

## 2015-12-22 DIAGNOSIS — E873 Alkalosis: Secondary | ICD-10-CM | POA: Diagnosis present

## 2015-12-22 DIAGNOSIS — Z885 Allergy status to narcotic agent status: Secondary | ICD-10-CM

## 2015-12-22 DIAGNOSIS — R064 Hyperventilation: Secondary | ICD-10-CM | POA: Diagnosis present

## 2015-12-22 DIAGNOSIS — R110 Nausea: Secondary | ICD-10-CM

## 2015-12-22 DIAGNOSIS — Z886 Allergy status to analgesic agent status: Secondary | ICD-10-CM

## 2015-12-22 DIAGNOSIS — Z794 Long term (current) use of insulin: Secondary | ICD-10-CM

## 2015-12-22 DIAGNOSIS — D649 Anemia, unspecified: Secondary | ICD-10-CM

## 2015-12-22 DIAGNOSIS — K2980 Duodenitis without bleeding: Secondary | ICD-10-CM | POA: Diagnosis present

## 2015-12-22 DIAGNOSIS — R569 Unspecified convulsions: Secondary | ICD-10-CM | POA: Diagnosis present

## 2015-12-22 LAB — BLOOD GAS, ARTERIAL
ALLENS TEST (PASS/FAIL): POSITIVE — AB
Acid-Base Excess: 3.5 mmol/L — ABNORMAL HIGH (ref 0.0–2.0)
BICARBONATE: 28.5 mmol/L — AB (ref 20.0–28.0)
FIO2: 0.21
O2 Saturation: 95.5 %
PCO2 ART: 44 mmHg (ref 32.0–48.0)
PH ART: 7.42 (ref 7.350–7.450)
Patient temperature: 37
pO2, Arterial: 77 mmHg — ABNORMAL LOW (ref 83.0–108.0)

## 2015-12-22 LAB — BASIC METABOLIC PANEL
Anion gap: 8 (ref 5–15)
CHLORIDE: 101 mmol/L (ref 101–111)
CO2: 28 mmol/L (ref 22–32)
Calcium: 8.4 mg/dL — ABNORMAL LOW (ref 8.9–10.3)
Creatinine, Ser: 0.4 mg/dL — ABNORMAL LOW (ref 0.44–1.00)
GFR calc Af Amer: >60 mL/min (ref >60)
GFR calc non Af Amer: >60 mL/min (ref >60)
GLUCOSE: 235 mg/dL — AB (ref 65–99)
POTASSIUM: 2.9 mmol/L — AB (ref 3.5–5.1)
Sodium: 137 mmol/L (ref 135–145)

## 2015-12-22 LAB — CBC WITH DIFFERENTIAL/PLATELET
Basophils Absolute: 0.1 10*3/uL (ref 0–0.1)
Basophils Relative: 1 %
EOS PCT: 0 %
Eosinophils Absolute: 0 10*3/uL (ref 0–0.7)
HCT: 40.8 % (ref 35.0–47.0)
Hemoglobin: 13.5 g/dL (ref 12.0–16.0)
LYMPHS PCT: 18 %
Lymphs Abs: 2.2 10*3/uL (ref 1.0–3.6)
MCH: 28.1 pg (ref 26.0–34.0)
MCHC: 33 g/dL (ref 32.0–36.0)
MCV: 85.2 fL (ref 80.0–100.0)
MONOS PCT: 5 %
Monocytes Absolute: 0.6 10*3/uL (ref 0.2–0.9)
Neutro Abs: 9.3 10*3/uL — ABNORMAL HIGH (ref 1.4–6.5)
Neutrophils Relative %: 76 %
PLATELETS: 268 10*3/uL (ref 150–440)
RBC: 4.79 MIL/uL (ref 3.80–5.20)
RDW: 12.8 % (ref 11.5–14.5)
WBC: 12.2 10*3/uL — ABNORMAL HIGH (ref 3.6–11.0)

## 2015-12-22 LAB — GLUCOSE, CAPILLARY
Glucose-Capillary: 216 mg/dL — ABNORMAL HIGH (ref 65–99)
Glucose-Capillary: 256 mg/dL — ABNORMAL HIGH (ref 65–99)
Glucose-Capillary: 201 mg/dL — ABNORMAL HIGH (ref 65–99)
Glucose-Capillary: 205 mg/dL — ABNORMAL HIGH (ref 65–99)

## 2015-12-22 LAB — CBC
HCT: 37.3 % (ref 35.0–47.0)
Hemoglobin: 12.9 g/dL (ref 12.0–16.0)
MCH: 29.3 pg (ref 26.0–34.0)
MCHC: 34.7 g/dL (ref 32.0–36.0)
MCV: 84.5 fL (ref 80.0–100.0)
PLATELETS: 231 10*3/uL (ref 150–440)
RBC: 4.41 MIL/uL (ref 3.80–5.20)
RDW: 13.1 % (ref 11.5–14.5)
WBC: 13.4 10*3/uL — AB (ref 3.6–11.0)

## 2015-12-22 LAB — COMPREHENSIVE METABOLIC PANEL
ALBUMIN: 4.2 g/dL (ref 3.5–5.0)
ALT: 11 U/L — AB (ref 14–54)
AST: 17 U/L (ref 15–41)
Alkaline Phosphatase: 40 U/L (ref 38–126)
Anion gap: 12 (ref 5–15)
BILIRUBIN TOTAL: 0.7 mg/dL (ref 0.3–1.2)
BUN: 6 mg/dL (ref 6–20)
CHLORIDE: 101 mmol/L (ref 101–111)
CO2: 27 mmol/L (ref 22–32)
CREATININE: 0.57 mg/dL (ref 0.44–1.00)
Calcium: 9.4 mg/dL (ref 8.9–10.3)
GFR calc Af Amer: >60 mL/min (ref >60)
GLUCOSE: 328 mg/dL — AB (ref 65–99)
POTASSIUM: 2.6 mmol/L — AB (ref 3.5–5.1)
Sodium: 140 mmol/L (ref 135–145)
Total Protein: 7.7 g/dL (ref 6.5–8.1)

## 2015-12-22 LAB — BLOOD GAS, VENOUS
Acid-Base Excess: 7 mmol/L — ABNORMAL HIGH (ref 0.0–2.0)
BICARBONATE: 27.4 mmol/L (ref 20.0–28.0)
O2 Saturation: 99.6 %
PATIENT TEMPERATURE: 37
pCO2, Ven: 26 mmHg — ABNORMAL LOW (ref 44.0–60.0)
pH, Ven: 7.63 (ref 7.250–7.430)
pO2, Ven: 149 mmHg — ABNORMAL HIGH (ref 32.0–45.0)

## 2015-12-22 LAB — LACTIC ACID, PLASMA
Lactic Acid, Venous: 3.2 mmol/L (ref 0.5–1.9)
Lactic Acid, Venous: 1.1 mmol/L (ref 0.5–1.9)

## 2015-12-22 LAB — CREATININE, SERUM
CREATININE: 0.45 mg/dL (ref 0.44–1.00)
GFR calc non Af Amer: >60 mL/min (ref >60)

## 2015-12-22 LAB — LIPASE, BLOOD: Lipase: 30 U/L (ref 11–51)

## 2015-12-22 LAB — MAGNESIUM: MAGNESIUM: 1.5 mg/dL — AB (ref 1.7–2.4)

## 2015-12-22 LAB — POTASSIUM: POTASSIUM: 2.8 mmol/L — AB (ref 3.5–5.1)

## 2015-12-22 MED ORDER — ONDANSETRON HCL 4 MG/2ML IJ SOLN
4.0000 mg | Freq: Four times a day (QID) | INTRAMUSCULAR | Status: DC | PRN
  Administered 2015-12-22: 4 mg via INTRAVENOUS
  Filled 2015-12-22: qty 2

## 2015-12-22 MED ORDER — ONDANSETRON HCL 4 MG/2ML IJ SOLN
4.0000 mg | Freq: Once | INTRAMUSCULAR | Status: AC
  Administered 2015-12-22: 4 mg via INTRAVENOUS

## 2015-12-22 MED ORDER — METRONIDAZOLE IN NACL 5-0.79 MG/ML-% IV SOLN
500.0000 mg | Freq: Three times a day (TID) | INTRAVENOUS | Status: DC
  Administered 2015-12-22 – 2015-12-24 (×6): 500 mg via INTRAVENOUS
  Filled 2015-12-22 (×6): qty 100

## 2015-12-22 MED ORDER — SODIUM CHLORIDE 0.9 % IV BOLUS (SEPSIS)
1000.0000 mL | Freq: Once | INTRAVENOUS | Status: AC
  Administered 2015-12-22: 1000 mL via INTRAVENOUS

## 2015-12-22 MED ORDER — POTASSIUM CHLORIDE IN NACL 20-0.9 MEQ/L-% IV SOLN
Freq: Once | INTRAVENOUS | Status: AC
  Administered 2015-12-22: 02:00:00 via INTRAVENOUS
  Filled 2015-12-22: qty 1000

## 2015-12-22 MED ORDER — METRONIDAZOLE IN NACL 5-0.79 MG/ML-% IV SOLN
INTRAVENOUS | Status: AC
  Administered 2015-12-22: 500 mg via INTRAVENOUS
  Filled 2015-12-22: qty 100

## 2015-12-22 MED ORDER — FAMOTIDINE IN NACL 20-0.9 MG/50ML-% IV SOLN
20.0000 mg | Freq: Two times a day (BID) | INTRAVENOUS | Status: DC
  Administered 2015-12-22 – 2015-12-23 (×2): 20 mg via INTRAVENOUS
  Filled 2015-12-22 (×4): qty 50

## 2015-12-22 MED ORDER — HYDROMORPHONE HCL 1 MG/ML IJ SOLN
0.5000 mg | Freq: Once | INTRAMUSCULAR | Status: AC
  Administered 2015-12-22: 0.5 mg via INTRAVENOUS
  Filled 2015-12-22: qty 1

## 2015-12-22 MED ORDER — ONDANSETRON HCL 4 MG/2ML IJ SOLN
4.0000 mg | Freq: Once | INTRAMUSCULAR | Status: AC
  Administered 2015-12-22: 4 mg via INTRAVENOUS
  Filled 2015-12-22: qty 2

## 2015-12-22 MED ORDER — IOPAMIDOL (ISOVUE-300) INJECTION 61%
30.0000 mL | Freq: Once | INTRAVENOUS | Status: AC | PRN
  Administered 2015-12-22: 30 mL via ORAL

## 2015-12-22 MED ORDER — POTASSIUM CHLORIDE 20 MEQ PO PACK
40.0000 meq | PACK | Freq: Two times a day (BID) | ORAL | Status: DC
  Administered 2015-12-22: 40 meq via ORAL
  Filled 2015-12-22: qty 2

## 2015-12-22 MED ORDER — POTASSIUM CHLORIDE IN NACL 40-0.9 MEQ/L-% IV SOLN
INTRAVENOUS | Status: AC
  Administered 2015-12-22: 100 mL/h via INTRAVENOUS
  Filled 2015-12-22: qty 1000

## 2015-12-22 MED ORDER — SODIUM CHLORIDE 0.9 % IV SOLN
750.0000 mg | Freq: Two times a day (BID) | INTRAVENOUS | Status: DC
  Administered 2015-12-22 – 2015-12-24 (×5): 750 mg via INTRAVENOUS
  Filled 2015-12-22 (×7): qty 7.5

## 2015-12-22 MED ORDER — MAGNESIUM SULFATE 2 GM/50ML IV SOLN
2.0000 g | Freq: Once | INTRAVENOUS | Status: AC
  Administered 2015-12-22: 2 g via INTRAVENOUS
  Filled 2015-12-22 (×2): qty 50

## 2015-12-22 MED ORDER — HYDROMORPHONE HCL 1 MG/ML IJ SOLN
0.5000 mg | Freq: Once | INTRAMUSCULAR | Status: AC
  Administered 2015-12-22: 0.5 mg via INTRAVENOUS

## 2015-12-22 MED ORDER — SODIUM BICARBONATE 8.4 % IV SOLN
50.0000 meq | Freq: Once | INTRAVENOUS | Status: AC
  Administered 2015-12-22: 50 meq via INTRAVENOUS
  Filled 2015-12-22: qty 50

## 2015-12-22 MED ORDER — HEPARIN SODIUM (PORCINE) 5000 UNIT/ML IJ SOLN
5000.0000 [IU] | Freq: Three times a day (TID) | INTRAMUSCULAR | Status: DC
Start: 2015-12-22 — End: 2015-12-24
  Administered 2015-12-22 – 2015-12-24 (×4): 5000 [IU] via SUBCUTANEOUS
  Filled 2015-12-22 (×3): qty 1

## 2015-12-22 MED ORDER — PROMETHAZINE HCL 25 MG/ML IJ SOLN
25.0000 mg | Freq: Once | INTRAMUSCULAR | Status: AC
  Administered 2015-12-22: 25 mg via INTRAVENOUS
  Filled 2015-12-22: qty 1

## 2015-12-22 MED ORDER — PROMETHAZINE HCL 25 MG/ML IJ SOLN
12.5000 mg | Freq: Four times a day (QID) | INTRAMUSCULAR | Status: DC | PRN
  Administered 2015-12-22 – 2015-12-23 (×2): 12.5 mg via INTRAVENOUS
  Filled 2015-12-22 (×2): qty 1

## 2015-12-22 MED ORDER — POTASSIUM CHLORIDE 10 MEQ/100ML IV SOLN
10.0000 meq | INTRAVENOUS | Status: AC
  Administered 2015-12-22 (×4): 10 meq via INTRAVENOUS
  Filled 2015-12-22 (×4): qty 100

## 2015-12-22 MED ORDER — INSULIN ASPART 100 UNIT/ML ~~LOC~~ SOLN
0.0000 [IU] | Freq: Three times a day (TID) | SUBCUTANEOUS | Status: DC
  Administered 2015-12-22: 3 [IU] via SUBCUTANEOUS
  Administered 2015-12-23: 1 [IU] via SUBCUTANEOUS
  Administered 2015-12-23: 2 [IU] via SUBCUTANEOUS
  Administered 2015-12-23: 5 [IU] via SUBCUTANEOUS
  Administered 2015-12-23: 1 [IU] via SUBCUTANEOUS
  Administered 2015-12-24: 2 [IU] via SUBCUTANEOUS
  Administered 2015-12-24: 5 [IU] via SUBCUTANEOUS
  Filled 2015-12-22: qty 2
  Filled 2015-12-22: qty 1
  Filled 2015-12-22: qty 2
  Filled 2015-12-22: qty 3
  Filled 2015-12-22 (×2): qty 5

## 2015-12-22 MED ORDER — POTASSIUM CHLORIDE 10 MEQ/100ML IV SOLN
10.0000 meq | INTRAVENOUS | Status: DC
  Filled 2015-12-22 (×4): qty 100

## 2015-12-22 MED ORDER — HYDROMORPHONE HCL 1 MG/ML IJ SOLN
INTRAMUSCULAR | Status: AC
  Administered 2015-12-22: 0.5 mg via INTRAVENOUS
  Filled 2015-12-22: qty 1

## 2015-12-22 MED ORDER — CIPROFLOXACIN IN D5W 200 MG/100ML IV SOLN
200.0000 mg | Freq: Two times a day (BID) | INTRAVENOUS | Status: DC
  Administered 2015-12-22 – 2015-12-23 (×2): 200 mg via INTRAVENOUS
  Filled 2015-12-22 (×3): qty 100

## 2015-12-22 MED ORDER — HYDROMORPHONE HCL 1 MG/ML IJ SOLN
1.0000 mg | INTRAMUSCULAR | Status: DC | PRN
  Administered 2015-12-22 – 2015-12-24 (×13): 1 mg via INTRAVENOUS
  Filled 2015-12-22 (×14): qty 1

## 2015-12-22 MED ORDER — IOPAMIDOL (ISOVUE-300) INJECTION 61%
100.0000 mL | Freq: Once | INTRAVENOUS | Status: AC | PRN
  Administered 2015-12-22: 75 mL via INTRAVENOUS

## 2015-12-22 MED ORDER — ONDANSETRON HCL 4 MG/2ML IJ SOLN
INTRAMUSCULAR | Status: AC
  Administered 2015-12-22: 4 mg via INTRAVENOUS
  Filled 2015-12-22: qty 2

## 2015-12-22 MED ORDER — FLUTICASONE PROPIONATE 50 MCG/ACT NA SUSP
2.0000 | Freq: Every day | NASAL | Status: DC
  Administered 2015-12-23 – 2015-12-24 (×2): 2 via NASAL
  Filled 2015-12-22: qty 16

## 2015-12-22 NOTE — ED Notes (Signed)
Pt assisted to the toilet with a steady gate. Urinated without difficulty.

## 2015-12-22 NOTE — ED Notes (Addendum)
Respiratory called for VBG pickup. Blood labeled at bedside in ice.

## 2015-12-22 NOTE — ED Notes (Signed)
Pt. Transported to CT 

## 2015-12-22 NOTE — ED Triage Notes (Signed)
Pt arrives ems from home. With complaints of n/v, pt reports no relief or ability to tolerate food/drinks. Pt was discharded on the sept. 26th. Ems reports blood sugar of 304

## 2015-12-22 NOTE — ED Notes (Signed)
Pt. Returned to tx. room in stable condition with no acute changes since departure from unit for scans.   

## 2015-12-22 NOTE — ED Notes (Signed)
Pt sick to her stomach, throwing up. MD notified verbal order for phenergan.

## 2015-12-22 NOTE — Progress Notes (Signed)
Inpatient Diabetes Program Recommendations  AACE/ADA: New Consensus Statement on Inpatient Glycemic Control (2015)  Target Ranges:  Prepandial:   less than 140 mg/dL      Peak postprandial:   less than 180 mg/dL (1-2 hours)      Critically ill patients:  140 - 180 mg/dL   Results for Rachael Gray, Rachael Gray (MRN [id number]) as of 12/22/2015 10:13  Ref. Range 12/22/2015 01:46  Glucose Latest Ref Range: 65 - 99 mg/dL 328 (H)   Review of Glycemic Control  Outpatient Diabetes medications: 70/30 25 units QAM, 70/30 22 units QPM Current orders for Inpatient glycemic control: Novolog 0-9 units TID with meals  Inpatient Diabetes Program Recommendations: Insulin - Basal: Note patient takes 70/30 as an outpatient. Since patient has nausea and may not tolerate PO intake, please consider ordering Lantus 6 units Q24H (based on 64 kg x 0.1 units).  Thanks, Barnie Alderman, RN, MSN, CDE Diabetes Coordinator Inpatient Diabetes Program [phone] (Team Pager from [place] to [place]) [phone] ([facility] office) [phone] [facility] office) [phone] [facility] office)

## 2015-12-22 NOTE — ED Notes (Signed)
Pt used call light. Asked patient how I could help her. Pt states in pain and dilaudid relieved her pain last time.

## 2015-12-22 NOTE — Progress Notes (Signed)
[facility] consulted to assist in electrolyte management in this 73 yoF presenting with nausea and vomiting.  Assessment/plan  9/29 1421 K = 2.9; Mag = 1.5 Patient received magnesium 2g x 1 and a total of 60 mEq IV potassium in maintenance fluids today.  She also received 40 mEq of PO KCl but per nursing notes vomited after dose.   09/29 1707 K = 2.8; Will replace with 10 mEq x 4 and follow up after last K run.  Allergies  Allergen Reactions  . Doxycycline Swelling  . Morphine And Related Rash  . Tylenol [Acetaminophen] Rash    Patient Measurements: Height: 5\' 5"  (165.1 cm) Weight: 142 lb (64.4 kg) IBW/kg (Calculated) : 57   Vital Signs: Temp: 98.5 F (36.9 C) (09/29 1631) Temp Source: Oral (09/29 1631) BP: 126/72 (09/29 1631) Pulse Rate: 94 (09/29 1631) Intake/Output from previous day: 09/28 0701 - 09/29 0700 In: 1000 [IV Piggyback:1000] Out: -  Intake/Output from this shift: Total I/O In: 200 [IV Piggyback:200] Out: 500 [Emesis/NG output:500]  Labs:  Recent Labs  12/22/15 0146 12/22/15 1421  WBC 12.2* 13.4*  HGB 13.5 12.9  HCT 40.8 37.3  PLT 268 231     Recent Labs  12/22/15 0146 12/22/15 1421 12/22/15 1707  NA 140 137  --   K 2.6* 2.9* 2.8*  CL 101 101  --   CO2 27 28  --   GLUCOSE 328* 235*  --   BUN 6 <5*  --   CREATININE 0.57 0.45  0.40*  --   CALCIUM 9.4 8.4*  --   MG  --  1.5*  --   PROT 7.7  --   --   ALBUMIN 4.2  --   --   AST 17  --   --   ALT 11*  --   --   ALKPHOS 40  --   --   BILITOT 0.7  --   --    Estimated Creatinine Clearance: 90 mL/min (by C-G formula based on SCr of 0.4 mg/dL (L)).    Recent Labs  12/22/15 1026 12/22/15 1229 12/22/15 1630  GLUCAP 216* 256* 201*    Medications:  Scheduled:  . ciprofloxacin  200 mg Intravenous Q12H  . famotidine (PEPCID) IV  20 mg Intravenous Q12H  . fluticasone  2 spray Each Nare Daily  . heparin  5,000 Units Subcutaneous Q8H  . insulin aspart  0-9  Units Subcutaneous TID WC  . levETIRAcetam  750 mg Intravenous Q12H  . magnesium sulfate 1 - 4 g bolus IVPB  2 g Intravenous Once  . metronidazole  500 mg Intravenous Q8H  . potassium chloride  10 mEq Intravenous Q1 Hr x 4    Darrow Bussing, PharmD Pharmacy Resident 12/22/2015 6:55 PM

## 2015-12-22 NOTE — ED Notes (Signed)
Pt shaking asked pt why she was shaking and pt responded "I am helping relieve my pain."

## 2015-12-22 NOTE — ED Provider Notes (Signed)
[facility] Emergency Department Provider Note   ____________________________________________   First MD Initiated Contact with Patient 12/22/15 0151     (approximate)  I have reviewed the triage vital signs and the nursing notes.   HISTORY  Chief Complaint Nausea    HPI Rachael Gray is a 35 y.o. female patient with several visits in the last few days. Comes in complaining of nausea vomiting but no diarrhea and diffuse abdominal pain..She cannot stop vomiting. She reports she is feeling worse every day. She has type 2 diabetes. She also has a history of lactic acidosis. Belly pain is severe and constant and achy. Past Medical History:  Diagnosis Date  . Diabetes mellitus without complication ([place])   . Gastroparesis   . Lupus ([place])   . Pseudoseizures   . Seizures [facility])     Patient Active Problem List   Diagnosis Date Noted  . Vomiting 12/16/2015  . Intractable nausea and vomiting 12/10/2014  . Nausea & vomiting 12/10/2014  . Narcotic abuse 12/09/2014  . Sepsis ([place]) 12/07/2014  . Anxiety   . Pyelonephritis 12/04/2014  . Lactic acidosis 12/04/2014    Class: Acute    Past Surgical History:  Procedure Laterality Date  . APPENDECTOMY    . CHOLECYSTECTOMY      Prior to Admission medications   Medication Sig Start Date End Date Taking? Authorizing Provider  brompheniramine-pseudoephedrine-DM 30-2-10 MG/5ML syrup Take 10 mLs by mouth 4 (four) times daily as needed. 12/15/15   Jami L Hagler, PA-C  dicyclomine (BENTYL) 20 MG tablet Take 1 tablet (20 mg total) by mouth 3 (three) times daily as needed for spasms. 12/18/15   Alexis Hugelmeyer, DO  fluticasone (FLONASE) 50 MCG/ACT nasal spray Place 2 sprays into both nostrils daily. 12/15/15   Jami L Hagler, PA-C  insulin NPH-regular Human (NOVOLIN 70/30) (70-30) 100 UNIT/ML injection Inject 22-25 Units into the skin 2 (two) times daily with a meal. 25 units in the morning and 22 units in the  evening 12/18/15   Alexis Hugelmeyer, DO  levETIRAcetam (KEPPRA) 750 MG tablet Take 1 tablet (750 mg total) by mouth 2 (two) times daily. 08/21/15   Paulette Blanch, MD  ondansetron (ZOFRAN ODT) 4 MG disintegrating tablet Take 1 tablet (4 mg total) by mouth every 8 (eight) hours as needed for nausea or vomiting. 12/19/15   Rudene Re, MD  ondansetron (ZOFRAN) 4 MG tablet Take 1 tablet (4 mg total) by mouth daily as needed for nausea or vomiting. 12/15/15 12/22/15  Jami L Hagler, PA-C  promethazine (PHENERGAN) 25 MG tablet Take 1 tablet (25 mg total) by mouth every 6 (six) hours as needed for nausea or vomiting. 12/18/15   Alexis Hugelmeyer, DO    Allergies Doxycycline; Morphine and related; and Tylenol [acetaminophen]  No family history on file.  Social History Social History  Substance Use Topics  . Smoking status: Former Smoker    Packs/day: 1.00    Types: Cigarettes  . Smokeless tobacco: Never Used  . Alcohol use Yes    Review of Systems Constitutional: No fever/chills Eyes: No visual changes. ENT: No sore throat. Cardiovascular: Denies chest pain. Respiratory: Denies shortness of breath. Gastrointestinal:See history of present illness. Genitourinary: Negative for dysuria. Musculoskeletal: Negative for back pain. Skin: Negative for rash. Neurological: Negative for headaches, focal weakness or numbness.  10-point ROS otherwise negative.  ____________________________________________   PHYSICAL EXAM:  VITAL SIGNS: ED Triage Vitals  Enc Vitals Group     BP 12/22/15 0025  139/81     Pulse Rate 12/22/15 0025 98     Resp 12/22/15 0025 (!) 23     Temp 12/22/15 0025 98.3 F (36.8 C)     Temp Source 12/22/15 0025 Oral     SpO2 12/22/15 0025 100 %     Weight 12/22/15 0025 142 lb (64.4 kg)     Height 12/22/15 0025 5\' 5"  (1.651 m)     Head Circumference --      Peak Flow --      Pain Score 12/22/15 0159 3     Pain Loc --      Pain Edu? --      Excl. in [place]? --      Constitutional: Alert and oriented. Well appearing and in no acute distress. Eyes: Conjunctivae are normal. PERRL. EOMI. Head: Atraumatic. Nose: No congestion/rhinnorhea. Mouth/Throat: Mucous membranes are moist.  Oropharynx non-erythematous. Neck: No stridor.  Cardiovascular: Normal rate, regular rhythm. Grossly normal heart sounds.  Good peripheral circulation. Respiratory: Normal respiratory effort.  No retractions. Lungs CTAB. Gastrointestinal: Firm diffusely tender decreased bowel sounds No distention. No abdominal bruits. No CVA tenderness. Musculoskeletal: No lower extremity tenderness nor edema.  No joint effusions. Neurologic:  Normal speech and language. No gross focal neurologic deficits are appreciated. No gait instability. Skin:  Skin is warm, dry and intact. No rash noted. Psychiatric: Mood and affect are normal. Speech and behavior are normal.  ____________________________________________   LABS (all labs ordered are listed, but only abnormal results are displayed)  Labs Reviewed  COMPREHENSIVE METABOLIC PANEL - Abnormal; Notable for the following:       Result Value   Potassium 2.6 (*)    Glucose, Bld 328 (*)    ALT 11 (*)    All other components within normal limits  LACTIC ACID, PLASMA - Abnormal; Notable for the following:    Lactic Acid, Venous 3.2 (*)    All other components within normal limits  CBC WITH DIFFERENTIAL/PLATELET - Abnormal; Notable for the following:    WBC 12.2 (*)    Neutro Abs 9.3 (*)    All other components within normal limits  BLOOD GAS, VENOUS - Abnormal; Notable for the following:    pH, Ven 7.63 (*)    pCO2, Ven 26 (*)    pO2, Ven 149.0 (*)    Acid-Base Excess 7.0 (*)    All other components within normal limits  LIPASE, BLOOD  LACTIC ACID, PLASMA   ____________________________________________  EKG  EKG read and interpreted by me shows sinus rhythm rate of 83. Irregular baseline in no acute ST-T wave  changes ____________________________________________  RADIOLOGY Study Result   CLINICAL DATA:  Nausea and vomiting.  Hyperglycemia  EXAM: CT ABDOMEN AND PELVIS WITH CONTRAST  TECHNIQUE: Multidetector CT imaging of the abdomen and pelvis was performed using the standard protocol following bolus administration of intravenous contrast.  CONTRAST:  82mL ISOVUE-300 IOPAMIDOL (ISOVUE-300) INJECTION 61%  COMPARISON:  November 28, 2014  FINDINGS: Lower chest: Lung bases are clear.  There is a small hiatal hernia.  Hepatobiliary: There is focal fatty infiltration near the fissure for the ligamentum teres. No focal liver lesions are evident. Gallbladder is absent. There is no appreciable biliary duct dilatation.  Pancreas: There is no pancreatic mass or inflammatory focus.  Spleen: No splenic lesions are evident.  Adrenals/Urinary Tract: Right adrenal appears normal. There is a left adrenal adenoma measuring 1.2 x 0.8 cm, also present previously. Kidneys bilaterally show no apparent mass or hydronephrosis on  either side. No perinephric stranding on either side. There is no renal or ureteral calculus on either side. Urinary bladder is midline with wall thickness within normal limits.  Stomach/Bowel: There is focal wall thickening in the proximal duodenum. There is no fistula are localized air in this area. No other bowel wall thickening is evident on this study. There is no appreciable mesenteric thickening. There is no evident bowel obstruction. No free air or portal venous air.  Vascular/Lymphatic: There is atherosclerotic plaque noted in the distal aorta with mild distal aortic calcification. There is calcification in both proximal common iliac arteries. Calcification is also noted in both proximal hypogastric arteries. The major mesenteric vessels appear patent. There is no abdominal aortic aneurysm. There is no adenopathy evident in the abdomen or  pelvis.  Reproductive: The uterus is anteverted. There is no pelvic mass or pelvic fluid collection.  Other: Appendix is not seen and by report is absent. There is no periappendiceal region inflammation. There is no ascites or abscess in the abdomen or pelvis.  Musculoskeletal: There are no blastic or lytic bone lesions. There is no intramuscular or abdominal wall lesion.  IMPRESSION: Wall thickening in the proximal duodenum consistent with a degree of duodenitis. No fistula or localized free air seen in this area. No other bowel wall thickening. No bowel obstruction evident. No abscess.  Gallbladder absent. Appendix not seen and by report absent. No periappendiceal region inflammation.  No renal or ureteral calculus.  No hydronephrosis.  Hiatal hernia present.  Aortoiliac atherosclerosis.  No abdominal aortic aneurysm.  Small left adrenal adenoma, stable.   Electronically Signed   By: Lowella Grip III M.D.   On: 12/22/2015 07:08      ____________________________________________   PROCEDURES  Procedure(s) performed:  Procedures  Critical Care performed:   ____________________________________________   INITIAL IMPRESSION / ASSESSMENT AND PLAN / ED COURSE  Pertinent labs & imaging results that were available during my care of the patient were reviewed by me and considered in my medical decision making (see chart for details).    Clinical Course   Patient got IV fluids, IV potassium and got some IV bicarbonate when her venous blood gas came back with pH 7 bad.  ____________________________________________   FINAL CLINICAL IMPRESSION(S) / ED DIAGNOSES  Final diagnoses:  Lactic acidosis  Hypokalemia  Duodenitis  Nausea  Nausea and vomiting, vomiting of unspecified type      NEW MEDICATIONS STARTED DURING THIS VISIT:  New Prescriptions   No medications on file     Note:  This document was prepared using Dragon voice  recognition software and may include unintentional dictation errors.    Nena Polio, MD 12/22/15 [phone]

## 2015-12-22 NOTE — ED Notes (Signed)
Patient denies pain and is resting comfortably.  

## 2015-12-22 NOTE — H&P (Signed)
[place] [facility] [place] NAME: Rachael Gray    MR#:  [id number]  DATE OF BIRTH:  December 21, 1980  DATE OF ADMISSION:  12/22/2015  PRIMARY CARE PHYSICIAN: No PCP Per Patient   REQUESTING/REFERRING PHYSICIAN: malinda  CHIEF COMPLAINT:   Chief Complaint  Patient presents with  . Nausea    HISTORY OF PRESENT ILLNESS: Rachael Gray  is a 35 y.o. 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 She continued to vomit, and could not take her medicines, so had seizures and came to ER next day- sent home after giving inj meds and stabilizing. Today came back again due to severe pain in epigastric region 10/10 , constant and severe vomiting. Noted to have alkalosis on VBG, and CT abd showed duodenitis- so given as admission.  PAST MEDICAL HISTORY:   Past Medical History:  Diagnosis Date  . Diabetes mellitus without complication ([place])   . Gastroparesis   . Lupus ([place])   . Pseudoseizures   . Seizures ([place])     PAST SURGICAL HISTORY: Past Surgical History:  Procedure Laterality Date  . APPENDECTOMY    . CHOLECYSTECTOMY      SOCIAL HISTORY:  Social History  Substance Use Topics  . Smoking status: Former Smoker    Packs/day: 1.00    Types: Cigarettes  . Smokeless tobacco: Never Used  . Alcohol use Yes    FAMILY HISTORY:  Family History  Problem Relation Age of Onset  . Breast cancer Maternal Grandmother     DRUG ALLERGIES:  Allergies  Allergen Reactions  . Doxycycline Swelling  . Morphine And Related Rash  . Tylenol [Acetaminophen] Rash    REVIEW OF SYSTEMS:   CONSTITUTIONAL: No fever, fatigue or weakness.  EYES: No blurred or double vision.  EARS, NOSE, AND THROAT: No tinnitus or ear pain.  RESPIRATORY: No cough, shortness of breath, wheezing or hemoptysis.  CARDIOVASCULAR: No chest  pain, orthopnea, edema.  GASTROINTESTINAL: positive for nausea, vomiting, no diarrhea, severe epigastric abdominal pain.  GENITOURINARY: No dysuria, hematuria.  ENDOCRINE: No polyuria, nocturia,  HEMATOLOGY: No anemia, easy bruising or bleeding SKIN: No rash or lesion. MUSCULOSKELETAL: No joint pain or arthritis.   NEUROLOGIC: No tingling, numbness, weakness.  PSYCHIATRY: No anxiety or depression.   MEDICATIONS AT HOME:  Prior to Admission medications   Medication Sig Start Date End Date Taking? Authorizing Provider  insulin NPH-regular Human (NOVOLIN 70/30) (70-30) 100 UNIT/ML injection Inject 22-25 Units into the skin 2 (two) times daily with a meal. 25 units in the morning and 22 units in the evening 12/18/15  Yes Alexis Hugelmeyer, DO  levETIRAcetam (KEPPRA) 750 MG tablet Take 1 tablet (750 mg total) by mouth 2 (two) times daily. 08/21/15  Yes Paulette Blanch, MD  dicyclomine (BENTYL) 20 MG tablet Take 1 tablet (20 mg total) by mouth 3 (three) times daily as needed for spasms. Patient not taking: Reported on 12/22/2015 12/18/15   Alexis Hugelmeyer, DO  fluticasone (FLONASE) 50 MCG/ACT nasal spray Place 2 sprays into both nostrils daily. Patient not taking: Reported on 12/22/2015 12/15/15   Jami L Hagler, PA-C      PHYSICAL EXAMINATION:   VITAL SIGNS: Blood pressure (!) 147/79, pulse 84, temperature 98.3 F (36.8 C), temperature source Oral, resp. rate 18, height 5\' 5"  (1.651 m), weight 64.4 kg (142 lb), last menstrual period 09/07/2015, SpO2  98 %.  GENERAL:  35 y.o.-year-old patient lying in the bed with acute distress due to pain.  EYES: Pupils equal, round, reactive to light and accommodation. No scleral icterus. Extraocular muscles intact.  HEENT: Head atraumatic, normocephalic. Oropharynx and nasopharynx clear.  NECK:  Supple, no jugular venous distention. No thyroid enlargement, no tenderness.  LUNGS: Normal breath sounds bilaterally, no wheezing, rales,rhonchi or crepitation. No use of  accessory muscles of respiration.  CARDIOVASCULAR: S1, S2 normal. No murmurs, rubs, or gallops.  ABDOMEN: Soft, epigastric and both upper quadrent tender, nondistended. Bowel sounds present. No organomegaly or mass.  EXTREMITIES: No pedal edema, cyanosis, or clubbing.  NEUROLOGIC: Cranial nerves II through XII are intact. Muscle strength 5/5 in all extremities. Sensation intact. Gait not checked.  PSYCHIATRIC: The patient is alert and oriented x 3.  SKIN: No obvious rash, lesion, or ulcer.   LABORATORY PANEL:   CBC  Recent Labs Lab 12/15/15 1507 12/16/15 0320 12/16/15 0723 12/17/15 0323 12/19/15 1725 12/22/15 0146  WBC 17.0* 21.7* 20.3* 18.1* 8.5 12.2*  HGB 14.0 14.2 12.7 12.0 13.0 13.5  HCT 41.1 41.5 39.3 35.5 38.8 40.8  PLT 205 212 207 195 221 268  MCV 85.9 85.2 87.2 85.8 84.6 85.2  MCH 29.2 29.1 28.2 29.0 28.4 28.1  MCHC 34.0 34.1 32.3 33.8 33.5 33.0  RDW 13.0 13.1 13.0 13.0 12.8 12.8  LYMPHSABS 2.6 0.7* 0.9*  --  3.2 2.2  MONOABS 0.9 0.4 0.6  --  0.7 0.6  EOSABS 0.4 0.0 0.0  --  0.1 0.0  BASOSABS 0.1 0.0 0.1  --  0.1 0.1   ------------------------------------------------------------------------------------------------------------------  Chemistries   Recent Labs Lab 12/15/15 1507 12/16/15 0320 12/19/15 1725 12/22/15 0146  NA 136 138 139 140  K 3.3* 3.7 3.2* 2.6*  CL 104 103 105 101  CO2 19* 20* 24 27  GLUCOSE 283* 337* 280* 328*  BUN 8 14 <5* 6  CREATININE 0.54 0.65 0.51 0.57  CALCIUM 9.3 9.5 8.7* 9.4  AST  --  15 19 17   ALT  --  11* 12* 11*  ALKPHOS  --  54 39 40  BILITOT  --  0.9 0.7 0.7   ------------------------------------------------------------------------------------------------------------------ estimated creatinine clearance is 90 mL/min (by C-G formula based on SCr of 0.57 mg/dL). ------------------------------------------------------------------------------------------------------------------ No results for input(s): TSH, T4TOTAL, T3FREE,  THYROIDAB in the last 72 hours.  Invalid input(s): FREET3   Coagulation profile No results for input(s): INR, PROTIME in the last 168 hours. ------------------------------------------------------------------------------------------------------------------- No results for input(s): DDIMER in the last 72 hours. -------------------------------------------------------------------------------------------------------------------  Cardiac Enzymes No results for input(s): CKMB, TROPONINI, MYOGLOBIN in the last 168 hours.  Invalid input(s): CK ------------------------------------------------------------------------------------------------------------------ Invalid input(s): POCBNP  ---------------------------------------------------------------------------------------------------------------  Urinalysis    Component Value Date/Time   COLORURINE STRAW (A) 12/19/2015 1807   APPEARANCEUR CLEAR (A) 12/19/2015 1807   APPEARANCEUR HAZY 06/15/2014 2100   LABSPEC 1.016 12/19/2015 1807   LABSPEC 1.018 06/15/2014 2100   PHURINE 7.0 12/19/2015 1807   GLUCOSEU >500 (A) 12/19/2015 1807   GLUCOSEU >=500 mg/dL 06/15/2014 2100   HGBUR 1+ (A) 12/19/2015 1807   BILIRUBINUR NEGATIVE 12/19/2015 1807   BILIRUBINUR NEGATIVE 06/15/2014 2100   KETONESUR TRACE (A) 12/19/2015 1807   PROTEINUR NEGATIVE 12/19/2015 1807   UROBILINOGEN 1.0 02/09/2008 1519   NITRITE NEGATIVE 12/19/2015 1807   LEUKOCYTESUR NEGATIVE 12/19/2015 1807   LEUKOCYTESUR TRACE 06/15/2014 2100     RADIOLOGY: Ct Abdomen Pelvis W Contrast  Result Date: 12/22/2015 CLINICAL DATA:  Nausea and vomiting.  Hyperglycemia  EXAM: CT ABDOMEN AND PELVIS WITH CONTRAST TECHNIQUE: Multidetector CT imaging of the abdomen and pelvis was performed using the standard protocol following bolus administration of intravenous contrast. CONTRAST:  42mL ISOVUE-300 IOPAMIDOL (ISOVUE-300) INJECTION 61% COMPARISON:  November 28, 2014 FINDINGS: Lower chest: Lung bases  are clear.  There is a small hiatal hernia. Hepatobiliary: There is focal fatty infiltration near the fissure for the ligamentum teres. No focal liver lesions are evident. Gallbladder is absent. There is no appreciable biliary duct dilatation. Pancreas: There is no pancreatic mass or inflammatory focus. Spleen: No splenic lesions are evident. Adrenals/Urinary Tract: Right adrenal appears normal. There is a left adrenal adenoma measuring 1.2 x 0.8 cm, also present previously. Kidneys bilaterally show no apparent mass or hydronephrosis on either side. No perinephric stranding on either side. There is no renal or ureteral calculus on either side. Urinary bladder is midline with wall thickness within normal limits. Stomach/Bowel: There is focal wall thickening in the proximal duodenum. There is no fistula are localized air in this area. No other bowel wall thickening is evident on this study. There is no appreciable mesenteric thickening. There is no evident bowel obstruction. No free air or portal venous air. Vascular/Lymphatic: There is atherosclerotic plaque noted in the distal aorta with mild distal aortic calcification. There is calcification in both proximal common iliac arteries. Calcification is also noted in both proximal hypogastric arteries. The major mesenteric vessels appear patent. There is no abdominal aortic aneurysm. There is no adenopathy evident in the abdomen or pelvis. Reproductive: The uterus is anteverted. There is no pelvic mass or pelvic fluid collection. Other: Appendix is not seen and by report is absent. There is no periappendiceal region inflammation. There is no ascites or abscess in the abdomen or pelvis. Musculoskeletal: There are no blastic or lytic bone lesions. There is no intramuscular or abdominal wall lesion. IMPRESSION: Wall thickening in the proximal duodenum consistent with a degree of duodenitis. No fistula or localized free air seen in this area. No other bowel wall thickening.  No bowel obstruction evident. No abscess. Gallbladder absent. Appendix not seen and by report absent. No periappendiceal region inflammation. No renal or ureteral calculus.  No hydronephrosis. Hiatal hernia present. Aortoiliac atherosclerosis.  No abdominal aortic aneurysm. Small left adrenal adenoma, stable. Electronically Signed   By: Lowella Grip III M.D.   On: 12/22/2015 07:08    EKG: Orders placed or performed during the hospital encounter of 12/22/15  . ED EKG  . ED EKG  . EKG 12-Lead  . EKG 12-Lead  . EKG 12-Lead  . EKG 12-Lead    IMPRESSION AND PLAN:  * Acute duodenitis   Intractable nausea and vomiting;   IV cipro and flagyl.   No localized fluid or air collection per CT abdomen.   IV fluids and supportive care for nausea and pain.   IV ranitidine   GI consult.  * Acute respiratory alkalosis   Due to hyperventilation secondary to pain   VI dilaudid to help.   Repeat ABG.   IV fluids.   Monitor in Step down unit.  * DM   As she will be on liquid diet , and not tolerating much, I will only keep on ISS.  * Seizures   IV keppra.  * Hypokalemia  IV kcl and oral replacement.  Check Magnesium.  * lactic acidosis   Due to dehydration and vomiting   Repeated and came down after IV fluids.  All the records are reviewed and case discussed  with ED provider. Management plans discussed with the patient, family and they are in agreement.  CODE STATUS: Full code. Code Status History    Date Active Date Inactive Code Status Order ID Comments User Context   12/16/2015 10:03 AM 12/18/2015  8:10 PM Full Code [id number]  Baxter Hire, MD Inpatient   12/10/2014  6:01 PM 12/11/2014  2:54 PM Full Code [id number]  Idelle Crouch, MD Inpatient   12/04/2014  8:23 PM 12/07/2014  3:32 PM Full Code [id number]  Demetrios Loll, MD Inpatient     Condition critical due to Alkalosis.  TOTAL TIME TAKING CARE OF THIS PATIENT: 50 critical care minutes.    Vaughan Basta M.D on  12/22/2015   Between 7am to 6pm - Pager - [phone]  After 6pm go to [email or web address] - password [email or web address] [facility]  Office  [phone]  CC: Primary care physician; No PCP Per Patient   Note: This dictation was prepared with Dragon dictation along with smaller phrase technology. Any transcriptional errors that result from this process are unintentional.

## 2015-12-22 NOTE — Progress Notes (Signed)
MEDICATION RELATED CONSULT NOTE - INITIAL   Pharmacy Consult for electrolyte monitoring and replacement Indication: hypomagnesemia/hypokalemia  Allergies  Allergen Reactions  . Doxycycline Swelling  . Morphine And Related Rash  . Tylenol [Acetaminophen] Rash   Patient Measurements: Height: 5\' 5"  (165.1 cm) Weight: 142 lb (64.4 kg) IBW/kg (Calculated) : 57  Vital Signs: Temp: 98.9 F (37.2 C) (09/29 1410) Temp Source: Oral (09/29 1410) BP: 164/83 (09/29 1410) Pulse Rate: 97 (09/29 1410) Intake/Output from previous day: 09/28 0701 - 09/29 0700 In: 1000 [IV Piggyback:1000] Out: -  Intake/Output from this shift: Total I/O In: -  Out: 500 [Emesis/NG output:500]  Labs:  Recent Labs  12/19/15 1725 12/22/15 0146 12/22/15 1421  WBC 8.5 12.2* 13.4*  HGB 13.0 13.5 12.9  HCT 38.8 40.8 37.3  PLT 221 268 231  CREATININE 0.51 0.57 0.45  0.40*  MG  --   --  1.5*  ALBUMIN 3.8 4.2  --   PROT 6.9 7.7  --   AST 19 17  --   ALT 12* 11*  --   ALKPHOS 39 40  --   BILITOT 0.7 0.7  --    Estimated Creatinine Clearance: 90 mL/min (by C-G formula based on SCr of 0.4 mg/dL (L)).   Microbiology: Recent Results (from the past 720 hour(s))  Culture, blood (routine x 2)     Status: None   Collection Time: 12/16/15  8:30 AM  Result Value Ref Range Status   Specimen Description BLOOD LEFT ANTECUBITAL  Final   Special Requests   Final    BOTTLES DRAWN AEROBIC AND ANAEROBIC  AER 5CC ANA 3CC   Culture NO GROWTH 5 DAYS  Final   Report Status 12/21/2015 FINAL  Final  Culture, blood (routine x 2)     Status: None   Collection Time: 12/16/15  8:31 AM  Result Value Ref Range Status   Specimen Description BLOOD LEFT ARM  Final   Special Requests BOTTLES DRAWN AEROBIC AND ANAEROBIC  3CC  Final   Culture NO GROWTH 5 DAYS  Final   Report Status 12/21/2015 FINAL  Final    Medical History: Past Medical History:  Diagnosis Date  . Diabetes mellitus without complication ([place])   .  Gastroparesis   . Lupus ([place])   . Pseudoseizures   . Seizures [facility])    Assessment: Pharmacy consulted to monitor and replace electrolytes as needed in this 35 year old female presenting with nausea and vomiting.   K = 2.9 this morning Mg = 1.5 this morning  Goal of Therapy:  Electrolytes within normal limits  Plan:  Order magnesium 2 g IV one time dose  Patient received a total of 60 mEq IV potassium in maintenance fluids today.  She also received 40 mEq of PO KCl but per nursing notes vomited after dose  Will recheck potassium now and replace if needed  Electrolytes ordered with AM labs tomorrow  Lenis Noon, PharmD, BCPS Clinical Pharmacist 12/22/2015,4:06 PM

## 2015-12-22 NOTE — ED Notes (Signed)
Pt vomited after taking potassium.

## 2015-12-23 ENCOUNTER — Encounter: Payer: Self-pay | Admitting: Internal Medicine

## 2015-12-23 DIAGNOSIS — E1143 Type 2 diabetes mellitus with diabetic autonomic (poly)neuropathy: Secondary | ICD-10-CM

## 2015-12-23 DIAGNOSIS — K3184 Gastroparesis: Secondary | ICD-10-CM

## 2015-12-23 LAB — BASIC METABOLIC PANEL
ANION GAP: 4 — AB (ref 5–15)
BUN: <5 mg/dL — ABNORMAL LOW (ref 6–20)
CALCIUM: 8.3 mg/dL — AB (ref 8.9–10.3)
CO2: 32 mmol/L (ref 22–32)
Chloride: 102 mmol/L (ref 101–111)
Creatinine, Ser: 0.48 mg/dL (ref 0.44–1.00)
GLUCOSE: 215 mg/dL — AB (ref 65–99)
POTASSIUM: 2.9 mmol/L — AB (ref 3.5–5.1)
Sodium: 138 mmol/L (ref 135–145)

## 2015-12-23 LAB — GLUCOSE, CAPILLARY
GLUCOSE-CAPILLARY: 177 mg/dL — AB (ref 65–99)
Glucose-Capillary: 139 mg/dL — ABNORMAL HIGH (ref 65–99)
Glucose-Capillary: 290 mg/dL — ABNORMAL HIGH (ref 65–99)
Glucose-Capillary: 179 mg/dL — ABNORMAL HIGH (ref 65–99)

## 2015-12-23 LAB — CBC
HEMATOCRIT: 34.1 % — AB (ref 35.0–47.0)
Hemoglobin: 11.5 g/dL — ABNORMAL LOW (ref 12.0–16.0)
MCH: 28.9 pg (ref 26.0–34.0)
MCHC: 33.8 g/dL (ref 32.0–36.0)
MCV: 85.3 fL (ref 80.0–100.0)
Platelets: 216 10*3/uL (ref 150–440)
RBC: 3.99 MIL/uL (ref 3.80–5.20)
RDW: 12.9 % (ref 11.5–14.5)
WBC: 10 10*3/uL (ref 3.6–11.0)

## 2015-12-23 LAB — POTASSIUM
POTASSIUM: 3.1 mmol/L — AB (ref 3.5–5.1)
POTASSIUM: 3.1 mmol/L — AB (ref 3.5–5.1)
POTASSIUM: 3.5 mmol/L (ref 3.5–5.1)

## 2015-12-23 LAB — TSH: TSH: 1.44 u[IU]/mL (ref 0.350–4.500)

## 2015-12-23 LAB — MAGNESIUM: MAGNESIUM: 2 mg/dL (ref 1.7–2.4)

## 2015-12-23 LAB — PHOSPHORUS: Phosphorus: 3.3 mg/dL (ref 2.5–4.6)

## 2015-12-23 MED ORDER — POTASSIUM CHLORIDE CRYS ER 20 MEQ PO TBCR
40.0000 meq | EXTENDED_RELEASE_TABLET | ORAL | Status: AC
  Administered 2015-12-23 (×3): 40 meq via ORAL
  Filled 2015-12-23 (×3): qty 2

## 2015-12-23 MED ORDER — FAMOTIDINE 20 MG PO TABS
20.0000 mg | ORAL_TABLET | Freq: Two times a day (BID) | ORAL | Status: DC
  Administered 2015-12-23 – 2015-12-24 (×2): 20 mg via ORAL
  Filled 2015-12-23 (×2): qty 1

## 2015-12-23 MED ORDER — SODIUM CHLORIDE 0.45 % IV SOLN
Freq: Once | INTRAVENOUS | Status: AC
  Administered 2015-12-23: 03:00:00 via INTRAVENOUS
  Filled 2015-12-23: qty 500

## 2015-12-23 MED ORDER — PANTOPRAZOLE SODIUM 40 MG PO TBEC
40.0000 mg | DELAYED_RELEASE_TABLET | Freq: Two times a day (BID) | ORAL | Status: DC
  Administered 2015-12-23 – 2015-12-24 (×3): 40 mg via ORAL
  Filled 2015-12-23 (×3): qty 1

## 2015-12-23 MED ORDER — POTASSIUM CHLORIDE 10 MEQ/100ML IV SOLN
10.0000 meq | INTRAVENOUS | Status: DC
  Administered 2015-12-23: 10 meq via INTRAVENOUS
  Filled 2015-12-23 (×10): qty 100

## 2015-12-23 MED ORDER — METOPROLOL TARTRATE 25 MG PO TABS
25.0000 mg | ORAL_TABLET | Freq: Two times a day (BID) | ORAL | Status: DC
  Administered 2015-12-23 – 2015-12-24 (×3): 25 mg via ORAL
  Filled 2015-12-23 (×3): qty 1

## 2015-12-23 MED ORDER — BOOST / RESOURCE BREEZE PO LIQD
1.0000 | Freq: Three times a day (TID) | ORAL | Status: DC
  Administered 2015-12-23 – 2015-12-24 (×4): 1 via ORAL

## 2015-12-23 MED ORDER — CIPROFLOXACIN IN D5W 400 MG/200ML IV SOLN
400.0000 mg | Freq: Two times a day (BID) | INTRAVENOUS | Status: DC
  Administered 2015-12-23: 400 mg via INTRAVENOUS
  Filled 2015-12-23 (×2): qty 200

## 2015-12-23 NOTE — Progress Notes (Signed)
Dr. Ether Griffins notified patient is burning at iv site when potassium is running. Dr. Ether Griffins stated to d/c iv potassium and ordered po.

## 2015-12-23 NOTE — Progress Notes (Signed)
[place] [facility] [place] NAME: Rachael Gray    MR#:  [id number]  DATE OF BIRTH:  1980/09/28  SUBJECTIVE:  CHIEF COMPLAINT:   Chief Complaint  Patient presents with  . Nausea  The patient is a 35 year old African-American female assessment history significant for history of diabetes, gastroparesis, pseudoseizures, lupus, who presents to the hospital with complaints of intractable nausea and vomiting, abdominal pain. Pain was described as severe in the epigastric region. Pain 10 out of 10, constant, associated with nausea and vomiting, radiating to the back. CT scan of abdomen revealed duodenitis. He was initiated on Cipro and Flagyl and improved, she is able to tolerate some liquid diet.   Review of Systems  Constitutional: Negative for chills, fever and weight loss.  HENT: Negative for congestion.   Eyes: Negative for blurred vision and double vision.  Respiratory: Negative for cough, sputum production, shortness of breath and wheezing.   Cardiovascular: Negative for chest pain, palpitations, orthopnea, leg swelling and PND.  Gastrointestinal: Positive for abdominal pain, nausea and vomiting. Negative for blood in stool, constipation and diarrhea.  Genitourinary: Negative for dysuria, frequency, hematuria and urgency.  Musculoskeletal: Negative for falls.  Neurological: Negative for dizziness, tremors, focal weakness and headaches.  Endo/Heme/Allergies: Does not bruise/bleed easily.  Psychiatric/Behavioral: Negative for depression. The patient does not have insomnia.     VITAL SIGNS: Blood pressure 121/83, pulse 89, temperature 98.3 F (36.8 C), temperature source Oral, resp. rate 18, height 5\' 5"  (1.651 m), weight 64.4 kg (142 lb), last menstrual period 09/07/2015, SpO2 98 %.  PHYSICAL EXAMINATION:   GENERAL:  35 y.o.-year-old patient lying in the bed with no acute distress.  EYES: Pupils equal, round, reactive to light and  accommodation. No scleral icterus. Extraocular muscles intact.  HEENT: Head atraumatic, normocephalic. Oropharynx and nasopharynx clear.  NECK:  Supple, no jugular venous distention. No thyroid enlargement, no tenderness.  LUNGS: Normal breath sounds bilaterally, no wheezing, rales,rhonchi or crepitation. No use of accessory muscles of respiration.  CARDIOVASCULAR: S1, S2 normal. No murmurs, rubs, or gallops.  ABDOMEN: Soft, tender in epigastric area with no rebound, some voluntary muscle guarding, nondistended. Bowel sounds present. No organomegaly or mass.  EXTREMITIES: No pedal edema, cyanosis, or clubbing.  NEUROLOGIC: Cranial nerves II through XII are intact. Muscle strength 5/5 in all extremities. Sensation intact. Gait not checked.  PSYCHIATRIC: The patient is alert and oriented x 3.  SKIN: No obvious rash, lesion, or ulcer.   ORDERS/RESULTS REVIEWED:   CBC  Recent Labs Lab 12/17/15 0323 12/19/15 1725 12/22/15 0146 12/22/15 1421 12/23/15 0354  WBC 18.1* 8.5 12.2* 13.4* 10.0  HGB 12.0 13.0 13.5 12.9 11.5*  HCT 35.5 38.8 40.8 37.3 34.1*  PLT 195 221 268 231 216  MCV 85.8 84.6 85.2 84.5 85.3  MCH 29.0 28.4 28.1 29.3 28.9  MCHC 33.8 33.5 33.0 34.7 33.8  RDW 13.0 12.8 12.8 13.1 12.9  LYMPHSABS  --  3.2 2.2  --   --   MONOABS  --  0.7 0.6  --   --   EOSABS  --  0.1 0.0  --   --   BASOSABS  --  0.1 0.1  --   --    ------------------------------------------------------------------------------------------------------------------  Chemistries   Recent Labs Lab 12/19/15 1725 12/22/15 0146 12/22/15 1421 12/22/15 1707 12/23/15 0129 12/23/15 0354 12/23/15 0730  NA 139 140 137  --   --  138  --   K 3.2* 2.6* 2.9* 2.8*  3.1* 2.9* 3.1*  CL 105 101 101  --   --  102  --   CO2 24 27 28   --   --  32  --   GLUCOSE 280* 328* 235*  --   --  215*  --   BUN <5* 6 <5*  --   --  <5*  --   CREATININE 0.51 0.57 0.45  0.40*  --   --  0.48  --   CALCIUM 8.7* 9.4 8.4*  --   --  8.3*  --    MG  --   --  1.5*  --   --  2.0  --   AST 19 17  --   --   --   --   --   ALT 12* 11*  --   --   --   --   --   ALKPHOS 39 40  --   --   --   --   --   BILITOT 0.7 0.7  --   --   --   --   --    ------------------------------------------------------------------------------------------------------------------ estimated creatinine clearance is 89.2 mL/min (by C-G formula based on SCr of 0.48 mg/dL). ------------------------------------------------------------------------------------------------------------------  Recent Labs  12/23/15 0730  TSH 1.440    Cardiac Enzymes No results for input(s): CKMB, TROPONINI, MYOGLOBIN in the last 168 hours.  Invalid input(s): CK ------------------------------------------------------------------------------------------------------------------ Invalid input(s): POCBNP ---------------------------------------------------------------------------------------------------------------  RADIOLOGY: Ct Abdomen Pelvis W Contrast  Result Date: 12/22/2015 CLINICAL DATA:  Nausea and vomiting.  Hyperglycemia EXAM: CT ABDOMEN AND PELVIS WITH CONTRAST TECHNIQUE: Multidetector CT imaging of the abdomen and pelvis was performed using the standard protocol following bolus administration of intravenous contrast. CONTRAST:  7mL ISOVUE-300 IOPAMIDOL (ISOVUE-300) INJECTION 61% COMPARISON:  November 28, 2014 FINDINGS: Lower chest: Lung bases are clear.  There is a small hiatal hernia. Hepatobiliary: There is focal fatty infiltration near the fissure for the ligamentum teres. No focal liver lesions are evident. Gallbladder is absent. There is no appreciable biliary duct dilatation. Pancreas: There is no pancreatic mass or inflammatory focus. Spleen: No splenic lesions are evident. Adrenals/Urinary Tract: Right adrenal appears normal. There is a left adrenal adenoma measuring 1.2 x 0.8 cm, also present previously. Kidneys bilaterally show no apparent mass or hydronephrosis on  either side. No perinephric stranding on either side. There is no renal or ureteral calculus on either side. Urinary bladder is midline with wall thickness within normal limits. Stomach/Bowel: There is focal wall thickening in the proximal duodenum. There is no fistula are localized air in this area. No other bowel wall thickening is evident on this study. There is no appreciable mesenteric thickening. There is no evident bowel obstruction. No free air or portal venous air. Vascular/Lymphatic: There is atherosclerotic plaque noted in the distal aorta with mild distal aortic calcification. There is calcification in both proximal common iliac arteries. Calcification is also noted in both proximal hypogastric arteries. The major mesenteric vessels appear patent. There is no abdominal aortic aneurysm. There is no adenopathy evident in the abdomen or pelvis. Reproductive: The uterus is anteverted. There is no pelvic mass or pelvic fluid collection. Other: Appendix is not seen and by report is absent. There is no periappendiceal region inflammation. There is no ascites or abscess in the abdomen or pelvis. Musculoskeletal: There are no blastic or lytic bone lesions. There is no intramuscular or abdominal wall lesion. IMPRESSION: Wall thickening in the proximal duodenum consistent with a degree of duodenitis. No fistula or localized  free air seen in this area. No other bowel wall thickening. No bowel obstruction evident. No abscess. Gallbladder absent. Appendix not seen and by report absent. No periappendiceal region inflammation. No renal or ureteral calculus.  No hydronephrosis. Hiatal hernia present. Aortoiliac atherosclerosis.  No abdominal aortic aneurysm. Small left adrenal adenoma, stable. Electronically Signed   By: Lowella Grip III M.D.   On: 12/22/2015 07:08    EKG:  Orders placed or performed during the hospital encounter of 12/22/15  . ED EKG  . ED EKG  . EKG 12-Lead  . EKG 12-Lead  . EKG 12-Lead   . EKG 12-Lead    ASSESSMENT AND PLAN:  Principal Problem:   Duodenitis Active Problems:   Nausea & vomiting   Acute respiratory alkalosis  #1. Upper abdominal pain of unclear etiology, concerning for proximal duodenitis, continue antibiotics, PPIs, Pain medications, following clinically, get gastroenterologist involved #2. Nausea and vomiting, supportive therapy, IV fluids #3. Hypokalemia, supplement orally, as patient is not able to tolerate intravenous infusion, magnesium level was low, replenished #4. Anemia. With rehydration, get Hemoccult #5. Diabetes mellitus, continue outpatient medications and sliding scale insulin, she has blood glucose levels are ranging between 140 to 200s.  #6 alkalosis, respiratory, as well as metabolic, supplementing potassium intravenously and orally, supportive therapy  Management plans discussed with the patient, family and they are in agreement.   DRUG ALLERGIES:  Allergies  Allergen Reactions  . Doxycycline Swelling  . Morphine And Related Rash  . Tylenol [Acetaminophen] Rash    CODE STATUS:     Code Status Orders        Start     Ordered   12/22/15 1410  Full code  Continuous     12/22/15 1409    Code Status History    Date Active Date Inactive Code Status Order ID Comments User Context   12/16/2015 10:03 AM 12/18/2015  8:10 PM Full Code [id number]  Baxter Hire, MD Inpatient   12/10/2014  6:01 PM 12/11/2014  2:54 PM Full Code [id number]  Idelle Crouch, MD Inpatient   12/04/2014  8:23 PM 12/07/2014  3:32 PM Full Code [id number]  Demetrios Loll, MD Inpatient      TOTAL TIME TAKING CARE OF THIS PATIENT: 40 minutes.    Theodoro Grist M.D on 12/23/2015 at 12:40 PM  Between 7am to 6pm - Pager - [phone]  After 6pm go to [email or web address] - password [email or web address] [facility]  [facility]  Office  [phone]  CC: Primary care physician; No PCP Per Patient

## 2015-12-23 NOTE — Progress Notes (Signed)
[place] consulted to assist in electrolyte management in this 47 yoF presenting with nausea and vomiting.  Assessment/plan  9/29 1421 K = 2.9; Mag = 1.5 Patient received magnesium 2g x 1 and a total of 60 mEq IV potassium in maintenance fluids today.  She also received 40 mEq of PO KCl but per nursing notes vomited after dose.   09/29 1707 K = 2.8; Will replace with 10 mEq x 4 and follow up after last K run.  9/30 0129 K 3.1. Will give potassium chloride 40 mEq in 0.45% sodium chloride 500 mL at 125 mL/hr over 4 hours x 1 and recheck electrolytes after infusion.  Allergies  Allergen Reactions  . Doxycycline Swelling  . Morphine And Related Rash  . Tylenol [Acetaminophen] Rash    Patient Measurements: Height: 5\' 5"  (165.1 cm) Weight: 142 lb (64.4 kg) IBW/kg (Calculated) : 57   Vital Signs: Temp: 98.4 F (36.9 C) (09/30 0103) Temp Source: Oral (09/30 0103) BP: 102/55 (09/30 0103) Pulse Rate: 90 (09/30 0103) Intake/Output from previous day: 09/29 0701 - 09/30 0700 In: 1205 [P.O.:240; IV Piggyback:965] Out: 500 [Emesis/NG output:500] Intake/Output from this shift: Total I/O In: 657.5 [IV Piggyback:657.5] Out: -   Labs:  Recent Labs  12/22/15 0146 12/22/15 1421  WBC 12.2* 13.4*  HGB 13.5 12.9  HCT 40.8 37.3  PLT 268 231     Recent Labs  12/22/15 0146 12/22/15 1421 12/22/15 1707 12/23/15 0129  NA 140 137  --   --   K 2.6* 2.9* 2.8* 3.1*  CL 101 101  --   --   CO2 27 28  --   --   GLUCOSE 328* 235*  --   --   BUN 6 <5*  --   --   CREATININE 0.57 0.45  0.40*  --   --   CALCIUM 9.4 8.4*  --   --   MG  --  1.5*  --   --   PROT 7.7  --   --   --   ALBUMIN 4.2  --   --   --   AST 17  --   --   --   ALT 11*  --   --   --   ALKPHOS 40  --   --   --   BILITOT 0.7  --   --   --    Estimated Creatinine Clearance: 89.2 mL/min (by C-G formula based on SCr of 0.4 mg/dL (L)).    Recent Labs  12/22/15 1229 12/22/15 1630  12/22/15 2144  GLUCAP 256* 201* 205*    Medications:  Scheduled:  . ciprofloxacin  200 mg Intravenous Q12H  . famotidine (PEPCID) IV  20 mg Intravenous Q12H  . fluticasone  2 spray Each Nare Daily  . heparin  5,000 Units Subcutaneous Q8H  . insulin aspart  0-9 Units Subcutaneous TID WC  . levETIRAcetam  750 mg Intravenous Q12H  . metronidazole  500 mg Intravenous Q8H  . potassium chloride 40 mEq in sodium chloride 0.45% 500 mL infusion   Intravenous Once    Ezri Landers A. [place], [place].D., BCPS Clinical Pharmacist 12/23/2015 2:29 AM

## 2015-12-23 NOTE — Consult Note (Signed)
Consultation  Referring Provider:     No ref. provider found Primary Care Physician:  No PCP Per Patient Primary Gastroenterologist:  Dr. Truman Hayward         Reason for Consultation:     35 yo female with nausea and vomiting because of  gastroparesis.  Date of Admission:  12/22/2015 Date of Consultation:  12/23/2015         HPI:   Rachael Gray is a 35 y.o. female admitted with nausea and vomiting secondary to gastroparesis . She has a long history of diabetes mellitus . She much more comfortable on her current therapy.  Past Medical History:  Diagnosis Date  . Diabetes mellitus without complication ([place])   . Gastroparesis   . Lupus ([place])   . Pseudoseizures   . Seizures ([place])     Past Surgical History:  Procedure Laterality Date  . APPENDECTOMY    . CHOLECYSTECTOMY      Prior to Admission medications   Medication Sig Start Date End Date Taking? Authorizing Provider  dicyclomine (BENTYL) 20 MG tablet Take 1 tablet (20 mg total) by mouth 3 (three) times daily as needed for spasms. 12/18/15  Yes Alexis HugelmeyerPP, DO  insulin NPH-regular Human (NOVOLIN 70/30) (70-30) 100 UNIT/ML injection Inject 22-25 Units into the skin 2 (two) times daily with a meal. 25 units in the morning and 22 units in the evening 12/18/15  Yes Alexis Hugelmeyer, DO  levETIRAcetam (KEPPRA) 750 MG tablet Take 1 tablet (750 mg total) by mouth 2 (two) times daily. 08/21/15  Yes Paulette Blanch, MD  fluticasone (FLONASE) 50 MCG/ACT nasal spray Place 2 sprays into both nostrils daily. Patient not taking: Reported on 12/22/2015 12/15/15   Braxton Feathers, PA-C    Family History  Problem Relation Age of Onset  . Breast cancer Maternal Grandmother      Social History  Substance Use Topics  . Smoking status: Former Smoker    Packs/day: 1.00    Types: Cigarettes  . Smokeless tobacco: Never Used  . Alcohol use Yes    Allergies as of 12/22/2015 - Review Complete 12/22/2015  Allergen Reaction Noted  . Doxycycline  Swelling 11/04/2014  . Morphine and related Rash 12/10/2014  . Tylenol [acetaminophen] Rash 11/04/2014    Review of Systems:    All systems reviewed and negative except where noted in HPI.   Physical Exam:  Vital signs in last 24 hours: Temp:  [98.2 F (36.8 C)-98.9 F (37.2 C)] 98.3 F (36.8 C) (09/30 1147) Pulse Rate:  [77-100] 89 (09/30 1147) Resp:  [14-18] 18 (09/30 0804) BP: (102-164)/(55-83) 121/83 (09/30 1147) SpO2:  [98 %-100 %] 98 % (09/30 1147) Last BM Date: 12/22/15 General:   Pleasant, cooperative in NAD Head:  Normocephalic and atraumatic. Eyes:   No icterus.   Conjunctiva pink. PERRLA. Ears:  Normal auditory acuity. Neck:  Supple; no masses or thyroidomegaly Lungs: Respirations even and unlabored. Lungs clear to auscultation bilaterally.   No wheezes, crackles, or rhonchi.  Heart:  Regular rate and rhythm;  Without murmur, clicks, rubs or gallops Abdomen:  Soft, nondistended, nontender. Normal bowel sounds. No appreciable masses or hepatomegaly.  No rebound or guarding.  Rectal:  Not performed. Msk:  Symmetrical without gross deformities.  Strength symetric   Extremities:  Without edema, cyanosis or clubbing. Neurologic:  Alert and oriented x3;  grossly normal neurologically. Skin:  Intact without significant lesions or rashes. Cervical Nodes:  No significant cervical adenopathy. Psych:  Alert and cooperative.  Normal affect.  LAB RESULTS:  Recent Labs  12/22/15 0146 12/22/15 1421 12/23/15 0354  WBC 12.2* 13.4* 10.0  HGB 13.5 12.9 11.5*  HCT 40.8 37.3 34.1*  PLT 268 231 216   BMET  Recent Labs  12/22/15 0146 12/22/15 1421  12/23/15 0129 12/23/15 0354 12/23/15 0730  NA 140 137  --   --  138  --   K 2.6* 2.9*  < > 3.1* 2.9* 3.1*  CL 101 101  --   --  102  --   CO2 27 28  --   --  32  --   GLUCOSE 328* 235*  --   --  215*  --   BUN 6 <5*  --   --  <5*  --   CREATININE 0.57 0.45  0.40*  --   --  0.48  --   CALCIUM 9.4 8.4*  --   --  8.3*  --     < > = values in this interval not displayed. LFT  Recent Labs  12/22/15 0146  PROT 7.7  ALBUMIN 4.2  AST 17  ALT 11*  ALKPHOS 40  BILITOT 0.7   PT/INR No results for input(s): LABPROT, INR in the last 72 hours.  STUDIES: Ct Abdomen Pelvis W Contrast  Result Date: 12/22/2015 CLINICAL DATA:  Nausea and vomiting.  Hyperglycemia EXAM: CT ABDOMEN AND PELVIS WITH CONTRAST TECHNIQUE: Multidetector CT imaging of the abdomen and pelvis was performed using the standard protocol following bolus administration of intravenous contrast. CONTRAST:  9mL ISOVUE-300 IOPAMIDOL (ISOVUE-300) INJECTION 61% COMPARISON:  November 28, 2014 FINDINGS: Lower chest: Lung bases are clear.  There is a small hiatal hernia. Hepatobiliary: There is focal fatty infiltration near the fissure for the ligamentum teres. No focal liver lesions are evident. Gallbladder is absent. There is no appreciable biliary duct dilatation. Pancreas: There is no pancreatic mass or inflammatory focus. Spleen: No splenic lesions are evident. Adrenals/Urinary Tract: Right adrenal appears normal. There is a left adrenal adenoma measuring 1.2 x 0.8 cm, also present previously. Kidneys bilaterally show no apparent mass or hydronephrosis on either side. No perinephric stranding on either side. There is no renal or ureteral calculus on either side. Urinary bladder is midline with wall thickness within normal limits. Stomach/Bowel: There is focal wall thickening in the proximal duodenum. There is no fistula are localized air in this area. No other bowel wall thickening is evident on this study. There is no appreciable mesenteric thickening. There is no evident bowel obstruction. No free air or portal venous air. Vascular/Lymphatic: There is atherosclerotic plaque noted in the distal aorta with mild distal aortic calcification. There is calcification in both proximal common iliac arteries. Calcification is also noted in both proximal hypogastric arteries.  The major mesenteric vessels appear patent. There is no abdominal aortic aneurysm. There is no adenopathy evident in the abdomen or pelvis. Reproductive: The uterus is anteverted. There is no pelvic mass or pelvic fluid collection. Other: Appendix is not seen and by report is absent. There is no periappendiceal region inflammation. There is no ascites or abscess in the abdomen or pelvis. Musculoskeletal: There are no blastic or lytic bone lesions. There is no intramuscular or abdominal wall lesion. IMPRESSION: Wall thickening in the proximal duodenum consistent with a degree of duodenitis. No fistula or localized free air seen in this area. No other bowel wall thickening. No bowel obstruction evident. No abscess. Gallbladder absent. Appendix not seen and by report absent. No periappendiceal region inflammation. No  renal or ureteral calculus.  No hydronephrosis. Hiatal hernia present. Aortoiliac atherosclerosis.  No abdominal aortic aneurysm. Small left adrenal adenoma, stable. Electronically Signed   By: Lowella Grip III M.D.   On: 12/22/2015 07:08      Impression / Plan:   Baelyn L Yager is a 35 y.o. y/o female with gastroparesis with duodenitis noted on CT.Plan current therapy including Prilosec,antiemetics,IV hydration and add low dose Reglan . I will follow.  Thank you for involving me in the care of this patient.      LOS: 1 day   Drinda Butts, MD  12/23/2015, 12:41 PM   Note: This dictation was prepared with Dragon dictation along with smaller phrase technology. Any transcriptional errors that result from this process are unintentional.

## 2015-12-23 NOTE — Progress Notes (Signed)
[place] consulted to assist in electrolyte management in this 6 yoF presenting with nausea and vomiting. Patient received potassium 60mEq IV on 9/19 and has received potassium 81mEq   Assessment/plan:  Will order additional potassium 67mEq IV Q1hr x 5 doses. Will recheck potassium at 1800. Will recheck all other eletrolytes with am labs.   Allergies  Allergen Reactions  . Doxycycline Swelling  . Morphine And Related Rash  . Tylenol [Acetaminophen] Rash    Patient Measurements: Height: 5\' 5"  (165.1 cm) Weight: 142 lb (64.4 kg) IBW/kg (Calculated) : 57   Vital Signs: Temp: 98.2 F (36.8 C) (09/30 0804) Temp Source: Oral (09/30 0804) BP: 118/68 (09/30 0804) Pulse Rate: 84 (09/30 0804) Intake/Output from previous day: 09/29 0701 - 09/30 0700 In: [id number] [P.O.:240; IV Piggyback:1165] Out: 500 [Emesis/NG output:500] Intake/Output from this shift: No intake/output data recorded.  Labs:  Recent Labs  12/22/15 0146 12/22/15 1421 12/23/15 0354  WBC 12.2* 13.4* 10.0  HGB 13.5 12.9 11.5*  HCT 40.8 37.3 34.1*  PLT 268 231 216     Recent Labs  12/22/15 0146 12/22/15 1421  12/23/15 0129 12/23/15 0354 12/23/15 0730  NA 140 137  --   --  138  --   K 2.6* 2.9*  < > 3.1* 2.9* 3.1*  CL 101 101  --   --  102  --   CO2 27 28  --   --  32  --   GLUCOSE 328* 235*  --   --  215*  --   BUN 6 <5*  --   --  <5*  --   CREATININE 0.57 0.45  0.40*  --   --  0.48  --   CALCIUM 9.4 8.4*  --   --  8.3*  --   MG  --  1.5*  --   --  2.0  --   PROT 7.7  --   --   --   --   --   ALBUMIN 4.2  --   --   --   --   --   AST 17  --   --   --   --   --   ALT 11*  --   --   --   --   --   ALKPHOS 40  --   --   --   --   --   BILITOT 0.7  --   --   --   --   --   < > = values in this interval not displayed. Estimated Creatinine Clearance: 89.2 mL/min (by C-G formula based on SCr of 0.48 mg/dL).    Recent Labs  12/22/15 1630 12/22/15 2144 12/23/15 0805  GLUCAP  201* 205* 177*    Medications:  Scheduled:  . ciprofloxacin  200 mg Intravenous Q12H  . famotidine  20 mg Oral BID  . feeding supplement  1 Container Oral TID BM  . fluticasone  2 spray Each Nare Daily  . heparin  5,000 Units Subcutaneous Q8H  . insulin aspart  0-9 Units Subcutaneous TID WC  . levETIRAcetam  750 mg Intravenous Q12H  . metronidazole  500 mg Intravenous Q8H  . potassium chloride  10 mEq Intravenous Q1 Hr x 5    Pharmacy will continue to monitor and adjust per consult.   MLS 12/23/2015 9:33 AM

## 2015-12-23 NOTE — Progress Notes (Signed)
[place] consulted to assist in electrolyte management in this 86 yoF presenting with nausea and vomiting. Patient received potassium 3mEq IV on 9/19 and has received potassium 8mEq   Assessment/plan:  Will order additional potassium 16mEq IV Q1hr x 5 doses. Will recheck potassium at 1800.  9/30 @ 18:43   K = 3.5, Phos = 3.3.  No supplementation needed at this time.   Will recheck eletrolytes with am labs.   Allergies  Allergen Reactions  . Doxycycline Swelling  . Morphine And Related Rash  . Tylenol [Acetaminophen] Rash    Patient Measurements: Height: 5\' 5"  (165.1 cm) Weight: 142 lb (64.4 kg) IBW/kg (Calculated) : 57   Vital Signs: Temp: 98.7 F (37.1 C) (09/30 1453) Temp Source: Oral (09/30 1453) BP: 119/69 (09/30 1453) Pulse Rate: 70 (09/30 1453) Intake/Output from previous day: 09/29 0701 - 09/30 0700 In: 1405 [P.O.:240; IV Piggyback:1165] Out: 500 [Emesis/NG output:500] Intake/Output from this shift: No intake/output data recorded.  Labs:  Recent Labs  12/22/15 0146 12/22/15 1421 12/23/15 0354  WBC 12.2* 13.4* 10.0  HGB 13.5 12.9 11.5*  HCT 40.8 37.3 34.1*  PLT 268 231 216     Recent Labs  12/22/15 0146 12/22/15 1421  12/23/15 0354 12/23/15 0730 12/23/15 1843  NA 140 137  --  138  --   --   K 2.6* 2.9*  < > 2.9* 3.1* 3.5  CL 101 101  --  102  --   --   CO2 27 28  --  32  --   --   GLUCOSE 328* 235*  --  215*  --   --   BUN 6 <5*  --  <5*  --   --   CREATININE 0.57 0.45  0.40*  --  0.48  --   --   CALCIUM 9.4 8.4*  --  8.3*  --   --   MG  --  1.5*  --  2.0  --   --   PHOS  --   --   --   --   --  3.3  PROT 7.7  --   --   --   --   --   ALBUMIN 4.2  --   --   --   --   --   AST 17  --   --   --   --   --   ALT 11*  --   --   --   --   --   ALKPHOS 40  --   --   --   --   --   BILITOT 0.7  --   --   --   --   --   < > = values in this interval not displayed. Estimated Creatinine Clearance: 89.2 mL/min (by C-G  formula based on SCr of 0.48 mg/dL).    Recent Labs  12/23/15 0805 12/23/15 1148 12/23/15 1711  GLUCAP 177* 139* 290*    Medications:  Scheduled:  . ciprofloxacin  400 mg Intravenous Q12H  . famotidine  20 mg Oral BID  . feeding supplement  1 Container Oral TID BM  . fluticasone  2 spray Each Nare Daily  . heparin  5,000 Units Subcutaneous Q8H  . insulin aspart  0-9 Units Subcutaneous TID WC  . levETIRAcetam  750 mg Intravenous Q12H  . metoprolol tartrate  25 mg Oral BID  . metronidazole  500 mg Intravenous Q8H  .  pantoprazole  40 mg Oral BID  . potassium chloride  40 mEq Oral Q4H    Pharmacy will continue to monitor and adjust per consult.   Olivia Canter [facility] Clinical Pharmacist 12/23/2015 7:24 PM

## 2015-12-23 NOTE — Progress Notes (Signed)
Dr. Ether Griffins notified central telemetry called stating patient went tachycardic of 137 and back down to the 80's around 0719. Dr. Ether Griffins telephone ordered tsh lab draw, metoprolol 25 mg po bid and check potassium after iv potassium is given.

## 2015-12-24 DIAGNOSIS — E119 Type 2 diabetes mellitus without complications: Secondary | ICD-10-CM

## 2015-12-24 DIAGNOSIS — E876 Hypokalemia: Secondary | ICD-10-CM

## 2015-12-24 DIAGNOSIS — D649 Anemia, unspecified: Secondary | ICD-10-CM

## 2015-12-24 LAB — BASIC METABOLIC PANEL
Anion gap: 5 (ref 5–15)
BUN: <5 mg/dL — ABNORMAL LOW (ref 6–20)
CALCIUM: 9 mg/dL (ref 8.9–10.3)
CO2: 27 mmol/L (ref 22–32)
CREATININE: 0.45 mg/dL (ref 0.44–1.00)
Chloride: 106 mmol/L (ref 101–111)
Glucose, Bld: 246 mg/dL — ABNORMAL HIGH (ref 65–99)
Potassium: 4 mmol/L (ref 3.5–5.1)
SODIUM: 138 mmol/L (ref 135–145)

## 2015-12-24 LAB — PHOSPHORUS: PHOSPHORUS: 2.5 mg/dL (ref 2.5–4.6)

## 2015-12-24 LAB — MAGNESIUM: MAGNESIUM: 1.7 mg/dL (ref 1.7–2.4)

## 2015-12-24 LAB — GLUCOSE, CAPILLARY
Glucose-Capillary: 287 mg/dL — ABNORMAL HIGH (ref 65–99)
Glucose-Capillary: 182 mg/dL — ABNORMAL HIGH (ref 65–99)

## 2015-12-24 MED ORDER — PANTOPRAZOLE SODIUM 40 MG PO TBEC: 40.0000 mg | DELAYED_RELEASE_TABLET | Freq: Two times a day (BID) | ORAL | 4 refills | Status: DC

## 2015-12-24 MED ORDER — PROMETHAZINE HCL 12.5 MG PO TABS: 12.5000 mg | ORAL_TABLET | Freq: Four times a day (QID) | ORAL | 6 refills | Status: DC | PRN

## 2015-12-24 MED ORDER — MAGNESIUM SULFATE 2 GM/50ML IV SOLN
2.0000 g | Freq: Once | INTRAVENOUS | Status: DC
  Administered 2015-12-24: 2 g via INTRAVENOUS
  Filled 2015-12-24: qty 50

## 2015-12-24 MED ORDER — METOCLOPRAMIDE HCL 5 MG PO TABS: 5.0000 mg | ORAL_TABLET | Freq: Four times a day (QID) | ORAL | 2 refills | Status: DC | PRN

## 2015-12-24 MED ORDER — BOOST / RESOURCE BREEZE PO LIQD: 1.0000 | Freq: Three times a day (TID) | ORAL | 0 refills | Status: DC

## 2015-12-24 NOTE — Progress Notes (Signed)
    Subjective   A little more nausea with oral intake today. Mild increase in upper abdominal pain.  Objective: Vital signs in last 24 hours: Vitals:   12/23/15 1453 12/23/15 2036 12/24/15 0038 12/24/15 0751  BP: 119/69 (!) 145/75 130/83 129/70  Pulse: 70 77 81 93  Resp:  18 16 14   Temp: 98.7 F (37.1 C) 98.4 F (36.9 C) 98.6 F (37 C) 98.9 F (37.2 C)  TempSrc: Oral Oral Oral Oral  SpO2: 95% 100% 95% 98%  Weight:      Height:       Weight change:   Intake/Output Summary (Last 24 hours) at 12/24/15 1333 Last data filed at 12/24/15 1101  Gross per 24 hour  Intake             1295 ml  Output                0 ml  Net             1295 ml     Exam: Heart:: Regular rate and rhythm Lungs: clear to auscultation and percussion Abdomen: Mild mid upper abdominal tenderness - no guarding or rebound   Lab Results: @LABTEST2 @ Micro Results: Recent Results (from the past 240 hour(s))  Culture, blood (routine x 2)     Status: None   Collection Time: 12/16/15  8:30 AM  Result Value Ref Range Status   Specimen Description BLOOD LEFT ANTECUBITAL  Final   Special Requests   Final    BOTTLES DRAWN AEROBIC AND ANAEROBIC  AER 5CC ANA 3CC   Culture NO GROWTH 5 DAYS  Final   Report Status 12/21/2015 FINAL  Final  Culture, blood (routine x 2)     Status: None   Collection Time: 12/16/15  8:31 AM  Result Value Ref Range Status   Specimen Description BLOOD LEFT ARM  Final   Special Requests BOTTLES DRAWN AEROBIC AND ANAEROBIC  3CC  Final   Culture NO GROWTH 5 DAYS  Final   Report Status 12/21/2015 FINAL  Final   Studies/Results: No results found. Medications: I have reviewed the patient's current medications. Scheduled Meds: . famotidine  20 mg Oral BID  . feeding supplement  1 Container Oral TID BM  . fluticasone  2 spray Each Nare Daily  . heparin  5,000 Units Subcutaneous Q8H  . insulin aspart  0-9 Units Subcutaneous TID WC  . levETIRAcetam  750 mg Intravenous Q12H  .  metoprolol tartrate  25 mg Oral BID  . pantoprazole  40 mg Oral BID   Continuous Infusions:  PRN Meds:.HYDROmorphone (DILAUDID) injection, ondansetron (ZOFRAN) IV, promethazine   Assessment: Principal Problem:   Nausea & vomiting Active Problems:   Acute respiratory alkalosis   Duodenitis   Gastroparesis due to DM (HCC)   Hypokalemia   Anemia   Diabetes ([place])    Plan: Current therapy with Proton pump inhibitors and antiemetics with low dose Reglan.I  will follow.   LOS: 2 days   Drinda Butts 12/24/2015, 1:33 PM

## 2015-12-24 NOTE — Progress Notes (Signed)
Patient discharging home, instructions given to patient. Verbalized understanding. VSS. IV removed. Waiting on sister to transport home.

## 2015-12-24 NOTE — Discharge Summary (Signed)
Rachael Gray at [place] NAME: Rachael Gray    MR#:  [id number]  DATE OF BIRTH:  09-08-1980  DATE OF ADMISSION:  12/22/2015 ADMITTING PHYSICIAN: Vaughan Basta, MD  DATE OF DISCHARGE: No discharge date for patient encounter.  PRIMARY CARE PHYSICIAN: No PCP Per Patient     ADMISSION DIAGNOSIS:  Hypokalemia [E87.6] Lactic acidosis [E87.2] Nausea [R11.0] Duodenitis [K29.80] Nausea and vomiting, vomiting of unspecified type [R11.2]  DISCHARGE DIAGNOSIS:  Principal Problem:   Nausea & vomiting Active Problems:   Duodenitis   Gastroparesis due to DM (HCC)   Hypokalemia   Anemia   Acute respiratory alkalosis   Diabetes ([place])   SECONDARY DIAGNOSIS:   Past Medical History:  Diagnosis Date  . Diabetes mellitus without complication ([place])   . Gastroparesis   . Lupus   . Pseudoseizures   . Seizures ([place])     .pro HOSPITAL COURSE:  The patient is a 35 year old African-American female assessment history significant for history of diabetes, gastroparesis, pseudoseizures, lupus, who presents to the hospital with complaints of intractable nausea and vomiting, abdominal pain. Pain was described as severe in the epigastric region. Pain 10 out of 10, constant, associated with nausea and vomiting, radiating to the back. CT scan of abdomen revealed duodenitis. She was initiated on antiemetics, pain medications, IV fluids, and improved, she is able to tolerate liquid diet, but continues to have abdominal pain. She was seen by gastroenterologist and supportive therapy was only recommended, it was felt that patient had a gastroparesis with duodenitis, he recommended to continue PPI, Reglan, antiemetics.  Discussion by problem: #1. Upper abdominal pain due to gastroparesis and proximal duodenitis, continue PPIs, antiemetics, Reglan Appreciate gastroenterologist input, patient is to follow-up with her primary care physician for further  recommendations. No pain medications were prescribed upon discharge #2. Nausea and vomiting, supportive therapy, antiemetics,  resolved #3. Hypokalemia, supplemented intravenously and orally,  magnesium level was low, replenished #4. Anemia with rehydration, Hemoccult was ordered, not received by the day of discharge it is recommended to follow patient's hemoglobin level as outpatient #5. Diabetes mellitus, continue outpatient medications and sliding scale insulin, she has blood glucose levels are ranging between 150 to 200s.  #6 alkalosis, respiratory, as well as metabolic, likely due to hypokalemia, potassium was supplemented  intravenously and orally, alkalosis resolved with conservative therapy  DISCHARGE CONDITIONS:   Stable  CONSULTS OBTAINED:  Treatment Team:  Manya Silvas, MD  DRUG ALLERGIES:   Allergies  Allergen Reactions  . Doxycycline Swelling  . Morphine And Related Rash  . Tylenol [Acetaminophen] Rash    DISCHARGE MEDICATIONS:   Current Discharge Medication List    START taking these medications   Details  feeding supplement (BOOST / RESOURCE BREEZE) LIQD Take 1 Container by mouth 3 (three) times daily between meals. Qty: 90 Container, Refills: 0    metoCLOPramide (REGLAN) 5 MG tablet Take 1 tablet (5 mg total) by mouth every 6 (six) hours as needed for nausea. Qty: 60 tablet, Refills: 2    pantoprazole (PROTONIX) 40 MG tablet Take 1 tablet (40 mg total) by mouth 2 (two) times daily. Qty: 60 tablet, Refills: 4    promethazine (PHENERGAN) 12.5 MG tablet Take 1 tablet (12.5 mg total) by mouth every 6 (six) hours as needed for nausea or vomiting. Qty: 60 tablet, Refills: 6      CONTINUE these medications which have NOT CHANGED   Details  dicyclomine (BENTYL) 20 MG tablet Take 1  tablet (20 mg total) by mouth 3 (three) times daily as needed for spasms. Qty: 30 tablet, Refills: 0    insulin NPH-regular Human (NOVOLIN 70/30) (70-30) 100 UNIT/ML injection  Inject 22-25 Units into the skin 2 (two) times daily with a meal. 25 units in the morning and 22 units in the evening Qty: 10 mL, Refills: 2    levETIRAcetam (KEPPRA) 750 MG tablet Take 1 tablet (750 mg total) by mouth 2 (two) times daily. Qty: 60 tablet, Refills: 0    fluticasone (FLONASE) 50 MCG/ACT nasal spray Place 2 sprays into both nostrils daily. Qty: 16 g, Refills: 0         DISCHARGE INSTRUCTIONS:    The patient is to follow-up with primary care physician within one week after discharge, gastroenterologist as needed  If you experience worsening of your admission symptoms, develop shortness of breath, life threatening emergency, suicidal or homicidal thoughts you must seek medical attention immediately by calling 911 or calling your MD immediately  if symptoms less severe.  You Must read complete instructions/literature along with all the possible adverse reactions/side effects for all the Medicines you take and that have been prescribed to you. Take any new Medicines after you have completely understood and accept all the possible adverse reactions/side effects.   Please note  You were cared for by a hospitalist during your hospital stay. If you have any questions about your discharge medications or the care you received while you were in the hospital after you are discharged, you can call the unit and asked to speak with the hospitalist on call if the hospitalist that took care of you is not available. Once you are discharged, your primary care physician will handle any further medical issues. Please note that NO REFILLS for any discharge medications will be authorized once you are discharged, as it is imperative that you return to your primary care physician (or establish a relationship with a primary care physician if you do not have one) for your aftercare needs so that they can reassess your need for medications and monitor your lab values.    Today   CHIEF COMPLAINT:    Chief Complaint  Patient presents with  . Nausea    HISTORY OF PRESENT ILLNESS:  Rachael Gray  is a 35 y.o. female with a known history of diabetes, gastroparesis, pseudoseizures, lupus, who presents to the hospital with complaints of intractable nausea and vomiting, abdominal pain. Pain was described as severe in the epigastric region. Pain 10 out of 10, constant, associated with nausea and vomiting, radiating to the back. CT scan of abdomen revealed duodenitis. She was initiated on antiemetics, pain medications, IV fluids, and improved, she is able to tolerate liquid diet, but continues to have abdominal pain. She was seen by gastroenterologist and supportive therapy was only recommended, it was felt that patient had a gastroparesis with duodenitis, he recommended to continue PPI, Reglan, antiemetics.  Discussion by problem: #1. Upper abdominal pain due to gastroparesis and proximal duodenitis, continue PPIs, antiemetics, Reglan Appreciate gastroenterologist input, patient is to follow-up with her primary care physician for further recommendations. No pain medications were prescribed upon discharge #2. Nausea and vomiting, supportive therapy, antiemetics,  resolved #3. Hypokalemia, supplemented intravenously and orally,  magnesium level was low, replenished #4. Anemia with rehydration, Hemoccult was ordered, not received by the day of discharge it is recommended to follow patient's hemoglobin level as outpatient #5. Diabetes mellitus, continue outpatient medications and sliding scale insulin, she has blood glucose  levels are ranging between 150 to 200s.  #6 alkalosis, respiratory, as well as metabolic, likely due to hypokalemia, potassium was supplemented  intravenously and orally, alkalosis resolved with conservative therapy    VITAL SIGNS:  Blood pressure 129/70, pulse 93, temperature 98.9 F (37.2 C), temperature source Oral, resp. rate 14, height 5\' 5"  (1.651 m), weight 64.4 kg (142  lb), last menstrual period 09/07/2015, SpO2 98 %.  I/O:    Intake/Output Summary (Last 24 hours) at 12/24/15 1320 Last data filed at 12/24/15 1101  Gross per 24 hour  Intake             1295 ml  Output                0 ml  Net             1295 ml    PHYSICAL EXAMINATION:  GENERAL:  35 y.o.-year-old patient lying in the bed with no acute distress.  EYES: Pupils equal, round, reactive to light and accommodation. No scleral icterus. Extraocular muscles intact.  HEENT: Head atraumatic, normocephalic. Oropharynx and nasopharynx clear.  NECK:  Supple, no jugular venous distention. No thyroid enlargement, no tenderness.  LUNGS: Normal breath sounds bilaterally, no wheezing, rales,rhonchi or crepitation. No use of accessory muscles of respiration.  CARDIOVASCULAR: S1, S2 normal. No murmurs, rubs, or gallops.  ABDOMEN: Soft, non-tender, non-distended. Bowel sounds present. No organomegaly or mass.  EXTREMITIES: No pedal edema, cyanosis, or clubbing.  NEUROLOGIC: Cranial nerves II through XII are intact. Muscle strength 5/5 in all extremities. Sensation intact. Gait not checked.  PSYCHIATRIC: The patient is alert and oriented x 3.  SKIN: No obvious rash, lesion, or ulcer.   DATA REVIEW:   CBC  Recent Labs Lab 12/23/15 0354  WBC 10.0  HGB 11.5*  HCT 34.1*  PLT 216    Chemistries   Recent Labs Lab 12/22/15 0146  12/24/15 0503  NA 140  < > 138  K 2.6*  < > 4.0  CL 101  < > 106  CO2 27  < > 27  GLUCOSE 328*  < > 246*  BUN 6  < > <5*  CREATININE 0.57  < > 0.45  CALCIUM 9.4  < > 9.0  MG  --   < > 1.7  AST 17  --   --   ALT 11*  --   --   ALKPHOS 40  --   --   BILITOT 0.7  --   --   < > = values in this interval not displayed.  Cardiac Enzymes No results for input(s): TROPONINI in the last 168 hours.  Microbiology Results  Results for orders placed or performed during the hospital encounter of 12/16/15  Culture, blood (routine x 2)     Status: None   Collection Time:  12/16/15  8:30 AM  Result Value Ref Range Status   Specimen Description BLOOD LEFT ANTECUBITAL  Final   Special Requests   Final    BOTTLES DRAWN AEROBIC AND ANAEROBIC  AER 5CC ANA 3CC   Culture NO GROWTH 5 DAYS  Final   Report Status 12/21/2015 FINAL  Final  Culture, blood (routine x 2)     Status: None   Collection Time: 12/16/15  8:31 AM  Result Value Ref Range Status   Specimen Description BLOOD LEFT ARM  Final   Special Requests BOTTLES DRAWN AEROBIC AND ANAEROBIC  3CC  Final   Culture NO GROWTH 5 DAYS  Final  Report Status 12/21/2015 FINAL  Final    RADIOLOGY:  No results found.  EKG:   Orders placed or performed during the hospital encounter of 12/22/15  . ED EKG  . ED EKG  . EKG 12-Lead  . EKG 12-Lead  . EKG 12-Lead  . EKG 12-Lead      Management plans discussed with the patient, family and they are in agreement.  CODE STATUS:     Code Status Orders        Start     Ordered   12/22/15 1410  Full code  Continuous     12/22/15 1409    Code Status History    Date Active Date Inactive Code Status Order ID Comments User Context   12/16/2015 10:03 AM 12/18/2015  8:10 PM Full Code [id number]  Baxter Hire, MD Inpatient   12/10/2014  6:01 PM 12/11/2014  2:54 PM Full Code [id number]  Idelle Crouch, MD Inpatient   12/04/2014  8:23 PM 12/07/2014  3:32 PM Full Code [id number]  Demetrios Loll, MD Inpatient      TOTAL TIME TAKING CARE OF THIS PATIENT: 40 minutes.    Theodoro Grist M.D on 12/24/2015 at 1:20 PM  Between 7am to 6pm - Pager - [phone]  After 6pm go to [email or web address] - password [email or web address] [facility]  [facility]  Office  [phone]  CC: Primary care physician; No PCP Per Patient

## 2015-12-24 NOTE — Progress Notes (Signed)
PHARMACY CONSULT NOTE- ELECTROLYTES  Pharmacy consulted to assist in electrolyte management in this 52 yoF presenting with nausea and vomiting. Patient has history significant for seizures.    Assessment/plan:  Will order magnesium 2g IV x 1 for goal magnesium >/= 2. Will obtain follow-up electrolytes with am labs.   Allergies  Allergen Reactions  . Doxycycline Swelling  . Morphine And Related Rash  . Tylenol [Acetaminophen] Rash    Patient Measurements: Height: 5\' 5"  (165.1 cm) Weight: 142 lb (64.4 kg) IBW/kg (Calculated) : 57   Vital Signs: Temp: 98.9 F (37.2 C) (10/01 0751) Temp Source: Oral (10/01 0751) BP: 129/70 (10/01 0751) Pulse Rate: 93 (10/01 0751) Intake/Output from previous day: 09/30 0701 - 10/01 0700 In: 840 [P.O.:840] Out: -  Intake/Output from this shift: No intake/output data recorded.  Labs:   Recent Labs  12/22/15 0146 12/22/15 1421  12/23/15 0354 12/23/15 0730 12/23/15 1843 12/24/15 0503  NA 140 137  --  138  --   --  138  K 2.6* 2.9*  < > 2.9* 3.1* 3.5 4.0  CL 101 101  --  102  --   --  106  CO2 27 28  --  32  --   --  27  GLUCOSE 328* 235*  --  215*  --   --  246*  BUN 6 <5*  --  <5*  --   --  <5*  CREATININE 0.57 0.45  0.40*  --  0.48  --   --  0.45  CALCIUM 9.4 8.4*  --  8.3*  --   --  9.0  MG  --  1.5*  --  2.0  --   --  1.7  PHOS  --   --   --   --   --  3.3 2.5  PROT 7.7  --   --   --   --   --   --   ALBUMIN 4.2  --   --   --   --   --   --   AST 17  --   --   --   --   --   --   ALT 11*  --   --   --   --   --   --   ALKPHOS 40  --   --   --   --   --   --   BILITOT 0.7  --   --   --   --   --   --   < > = values in this interval not displayed. Estimated Creatinine Clearance: 89.2 mL/min (by C-G formula based on SCr of 0.45 mg/dL).    Recent Labs  12/23/15 1148 12/23/15 1711 12/23/15 2157  GLUCAP 139* 290* 179*    M  Pharmacy will continue to monitor and adjust per consult.   Simpson,Michael L, [facility] Clinical  Pharmacist 12/24/2015 8:01 AM

## 2015-12-24 NOTE — Care Management Note (Signed)
Case Management Note  Patient Details  Name: Rachael Gray MRN: [id number] Date of Birth: 1980-05-27  Subjective/Objective:            No home health needs. Has a referral for [facility] for meds. Phenergan on the $4.00 list from [place] if Ms Issa needs to have it filled over the weekend.         Action/Plan:   Expected Discharge Date:                  Expected Discharge Plan:     In-House Referral:     Discharge planning Services     Post Acute Care Choice:    Choice offered to:     DME Arranged:    DME Agency:     HH Arranged:    HH Agency:     Status of Service:     If discussed at [employer] of Stay Meetings, dates discussed:    Additional Comments:  Octavie Westerhold A, RN 12/24/2015, 9:08 AM

## 2015-12-24 NOTE — Progress Notes (Signed)
Patient tolerated a few bites of her breakfast tray, and started to feel nauseous so stopped eating.

## 2016-04-12 ENCOUNTER — Emergency Department
Admission: EM | Admit: 2016-04-12 | Discharge: 2016-04-13 | Disposition: A | Discharge status: Home / Self Care | Payer: Self-pay | Attending: Emergency Medicine | Admitting: Emergency Medicine

## 2016-04-12 DIAGNOSIS — M545 Low back pain, unspecified: Secondary | ICD-10-CM

## 2016-04-12 DIAGNOSIS — E1165 Type 2 diabetes mellitus with hyperglycemia: Secondary | ICD-10-CM | POA: Insufficient documentation

## 2016-04-12 DIAGNOSIS — R112 Nausea with vomiting, unspecified: Secondary | ICD-10-CM

## 2016-04-12 DIAGNOSIS — R1084 Generalized abdominal pain: Secondary | ICD-10-CM

## 2016-04-12 DIAGNOSIS — Z794 Long term (current) use of insulin: Secondary | ICD-10-CM | POA: Insufficient documentation

## 2016-04-12 DIAGNOSIS — R739 Hyperglycemia, unspecified: Secondary | ICD-10-CM

## 2016-04-12 DIAGNOSIS — Z87891 Personal history of nicotine dependence: Secondary | ICD-10-CM | POA: Insufficient documentation

## 2016-04-12 DIAGNOSIS — Z79899 Other long term (current) drug therapy: Secondary | ICD-10-CM | POA: Insufficient documentation

## 2016-04-12 LAB — CBC
HCT: 41 % (ref 35.0–47.0)
HEMOGLOBIN: 13.9 g/dL (ref 12.0–16.0)
MCH: 28.6 pg (ref 26.0–34.0)
MCHC: 33.8 g/dL (ref 32.0–36.0)
MCV: 84.4 fL (ref 80.0–100.0)
Platelets: 226 10*3/uL (ref 150–440)
RBC: 4.86 MIL/uL (ref 3.80–5.20)
RDW: 12.8 % (ref 11.5–14.5)
WBC: 7.8 10*3/uL (ref 3.6–11.0)

## 2016-04-12 LAB — URINALYSIS, COMPLETE (UACMP) WITH MICROSCOPIC
BACTERIA UA: NONE SEEN
Bilirubin Urine: NEGATIVE
Glucose, UA: >=500 mg/dL — AB
Ketones, ur: NEGATIVE mg/dL
Leukocytes, UA: NEGATIVE
Nitrite: NEGATIVE
PROTEIN: 30 mg/dL — AB
Specific Gravity, Urine: 1.038 — ABNORMAL HIGH (ref 1.005–1.030)
WBC UA: NONE SEEN WBC/hpf (ref 0–5)
pH: 6 (ref 5.0–8.0)

## 2016-04-12 LAB — COMPREHENSIVE METABOLIC PANEL
ALBUMIN: 4.4 g/dL (ref 3.5–5.0)
ALT: 9 U/L — AB (ref 14–54)
ANION GAP: 9 (ref 5–15)
AST: 15 U/L (ref 15–41)
Alkaline Phosphatase: 51 U/L (ref 38–126)
BUN: 7 mg/dL (ref 6–20)
CHLORIDE: 95 mmol/L — AB (ref 101–111)
CO2: 27 mmol/L (ref 22–32)
Calcium: 9.3 mg/dL (ref 8.9–10.3)
Creatinine, Ser: 0.61 mg/dL (ref 0.44–1.00)
GFR calc non Af Amer: >60 mL/min (ref >60)
Glucose, Bld: 369 mg/dL — ABNORMAL HIGH (ref 65–99)
Potassium: 3.2 mmol/L — ABNORMAL LOW (ref 3.5–5.1)
SODIUM: 131 mmol/L — AB (ref 135–145)
Total Bilirubin: 0.4 mg/dL (ref 0.3–1.2)
Total Protein: 8.3 g/dL — ABNORMAL HIGH (ref 6.5–8.1)

## 2016-04-12 LAB — POCT PREGNANCY, URINE: Preg Test, Ur: NEGATIVE

## 2016-04-12 LAB — LIPASE, BLOOD: LIPASE: 57 U/L — AB (ref 11–51)

## 2016-04-12 LAB — INFLUENZA PANEL BY PCR (TYPE A & B)
INFLAPCR: NEGATIVE
INFLBPCR: NEGATIVE

## 2016-04-12 LAB — TROPONIN I

## 2016-04-12 MED ORDER — FENTANYL CITRATE (PF) 100 MCG/2ML IJ SOLN
INTRAMUSCULAR | Status: AC
  Filled 2016-04-12: qty 2

## 2016-04-12 MED ORDER — LORAZEPAM 2 MG/ML IJ SOLN
1.0000 mg | Freq: Once | INTRAMUSCULAR | Status: AC
  Administered 2016-04-12: 1 mg via INTRAVENOUS
  Filled 2016-04-12: qty 1

## 2016-04-12 MED ORDER — GI COCKTAIL ~~LOC~~
30.0000 mL | Freq: Once | ORAL | Status: AC
  Administered 2016-04-12: 30 mL via ORAL
  Filled 2016-04-12: qty 30

## 2016-04-12 MED ORDER — FENTANYL CITRATE (PF) 100 MCG/2ML IJ SOLN
25.0000 ug | Freq: Once | INTRAMUSCULAR | Status: AC
  Administered 2016-04-12: 25 ug via INTRAVENOUS

## 2016-04-12 MED ORDER — SODIUM CHLORIDE 0.9 % IV BOLUS (SEPSIS)
1000.0000 mL | Freq: Once | INTRAVENOUS | Status: AC
  Administered 2016-04-12: 1000 mL via INTRAVENOUS

## 2016-04-12 NOTE — ED Triage Notes (Addendum)
Pt presents to ED with c/o a seizure 10 minutes PTA, RIGHTleg pain, abdominal pain and emesis x3 days. Pt reports chills, but didn't take temp at home. Pt reports SHOB and CP, 10-12 episodes of emesis and 1 episode of diarrhea. Pt is A&O, in NAD, with respirations even, regular, and unlabored. Pt reports menstrual cycle started yesterday, heavier yesterday but decreased today.

## 2016-04-13 LAB — GLUCOSE, CAPILLARY
GLUCOSE-CAPILLARY: 334 mg/dL — AB (ref 65–99)
Glucose-Capillary: 392 mg/dL — ABNORMAL HIGH (ref 65–99)

## 2016-04-13 MED ORDER — DICLOFENAC SODIUM 3 % TD GEL: 1.0000 "application " | Freq: Two times a day (BID) | TRANSDERMAL | 0 refills | Status: DC | PRN

## 2016-04-13 MED ORDER — ONDANSETRON HCL 4 MG PO TABS: 4.0000 mg | ORAL_TABLET | Freq: Every day | ORAL | 0 refills | Status: DC | PRN

## 2016-04-13 MED ORDER — DICYCLOMINE HCL 10 MG PO CAPS: 10.0000 mg | ORAL_CAPSULE | Freq: Four times a day (QID) | ORAL | 0 refills | Status: DC | PRN

## 2016-04-13 MED ORDER — SODIUM CHLORIDE 0.9 % IV BOLUS (SEPSIS)
1000.0000 mL | Freq: Once | INTRAVENOUS | Status: AC
  Administered 2016-04-13: 1000 mL via INTRAVENOUS

## 2016-04-13 NOTE — ED Provider Notes (Addendum)
[facility] Emergency Department Provider Note  ____________________________________________   First MD Initiated Contact with Patient 04/12/16 2214     (approximate)  I have reviewed the triage vital signs and the nursing notes.   HISTORY  Chief Complaint Emesis; Abdominal Pain; and Seizures   HPI Rachael Gray is a 36 y.o. female with a history of diabetes as well as gastroparesis and lupus and pseudoseizures who is presenting to the emergency department today with right lower back pain over the past 3 days. She is also describing acute abdominal pain as well as nausea vomiting. Says that she has had one episode of diarrhea 3 days ago but is no further episodes. She says that she also experienced chest burning which started after the vomiting. Says that she has had about 10 episodes today. Says that she also has not taken her sliding scale insulin today because when she took her sugar at home earlier today was 180. She says that also she had a seizure on the way over to the hospital. Her fianc was in the room with her says that the last about 10 minutes. Was a generalized seizure. Patient says that after the seizure she had right lower extremity cramping. Denies any weakness.Patient says that she is also on her period. Says that she does not usually have nausea and vomiting and similar symptoms to this with her periods.   Past Medical History:  Diagnosis Date  . Diabetes mellitus without complication ([place])   . Gastroparesis   . Lupus   . Pseudoseizures   . Seizures [facility])     Patient Active Problem List   Diagnosis Date Noted  . Hypokalemia 12/24/2015  . Anemia 12/24/2015  . Diabetes ([place]) 12/24/2015  . Gastroparesis due to DM ([employer])   . Acute respiratory alkalosis 12/22/2015  . Duodenitis 12/22/2015  . Vomiting 12/16/2015  . Intractable nausea and vomiting 12/10/2014  . Nausea & vomiting 12/10/2014  . Narcotic abuse 12/09/2014  . Sepsis ([place])  12/07/2014  . Anxiety   . Pyelonephritis 12/04/2014  . Lactic acidosis 12/04/2014    Class: Acute    Past Surgical History:  Procedure Laterality Date  . APPENDECTOMY    . CHOLECYSTECTOMY      Prior to Admission medications   Medication Sig Start Date End Date Taking? Authorizing Provider  dicyclomine (BENTYL) 20 MG tablet Take 1 tablet (20 mg total) by mouth 3 (three) times daily as needed for spasms. 12/18/15   Alexis Hugelmeyer, DO  feeding supplement (BOOST / RESOURCE BREEZE) LIQD Take 1 Container by mouth 3 (three) times daily between meals. 12/24/15   Theodoro Grist, MD  fluticasone (FLONASE) 50 MCG/ACT nasal spray Place 2 sprays into both nostrils daily. Patient not taking: Reported on 12/22/2015 12/15/15   Jami L Hagler, PA-C  insulin NPH-regular Human (NOVOLIN 70/30) (70-30) 100 UNIT/ML injection Inject 22-25 Units into the skin 2 (two) times daily with a meal. 25 units in the morning and 22 units in the evening 12/18/15   Alexis Hugelmeyer, DO  levETIRAcetam (KEPPRA) 750 MG tablet Take 1 tablet (750 mg total) by mouth 2 (two) times daily. 08/21/15   Paulette Blanch, MD  metoCLOPramide (REGLAN) 5 MG tablet Take 1 tablet (5 mg total) by mouth every 6 (six) hours as needed for nausea. 12/24/15   Theodoro Grist, MD  pantoprazole (PROTONIX) 40 MG tablet Take 1 tablet (40 mg total) by mouth 2 (two) times daily. 12/24/15   Theodoro Grist, MD  promethazine (  PHENERGAN) 12.5 MG tablet Take 1 tablet (12.5 mg total) by mouth every 6 (six) hours as needed for nausea or vomiting. 12/24/15   Theodoro Grist, MD    Allergies Doxycycline; Morphine and related; and Tylenol [acetaminophen]  Family History  Problem Relation Age of Onset  . Breast cancer Maternal Grandmother     Social History Social History  Substance Use Topics  . Smoking status: Former Smoker    Packs/day: 1.00    Types: Cigarettes  . Smokeless tobacco: Never Used  . Alcohol use Yes    Review of Systems Constitutional: No  fever/chills Eyes: No visual changes. ENT: No sore throat. Cardiovascular:  As above Respiratory: Denies shortness of breath. Gastrointestinal:   No constipation. Genitourinary: Negative for dysuria. Musculoskeletal: as above Skin: Negative for rash. Neurological: Negative for headaches, focal weakness or numbness.  10-point ROS otherwise negative.  ____________________________________________   PHYSICAL EXAM:  VITAL SIGNS: ED Triage Vitals  Enc Vitals Group     BP 04/12/16 2005 118/80     Pulse Rate 04/12/16 2005 (!) 112     Resp 04/12/16 2005 20     Temp 04/12/16 2005 98.5 F (36.9 C)     Temp Source 04/12/16 2005 Oral     SpO2 04/12/16 2005 97 %     Weight 04/12/16 2001 137 lb (62.1 kg)     Height 04/12/16 2001 5\' 5"  (1.651 m)     Head Circumference --      Peak Flow --      Pain Score 04/12/16 2001 9     Pain Loc --      Pain Edu? --      Excl. in [place]? --     Constitutional: Alert and oriented. Well appearing and in no acute distress. Eyes: Conjunctivae are normal. PERRL. EOMI. Head: Atraumatic. Nose: No congestion/rhinnorhea. Mouth/Throat: Mucous membranes are moist.  Neck: No stridor.   Cardiovascular: Tachycardic, regular rhythm. Grossly normal heart sounds.  Good peripheral circulation. Respiratory: Normal respiratory effort.  No retractions. Lungs CTAB. Gastrointestinal: Soft With diffuse tenderness to palpation without rebound or guarding. No distention.  Musculoskeletal: 5 out of 5 strength to bilateral lower extremities. Bilateral dorsalis pedis pulses which are equal. Negative straight leg raise bilaterally. Tenderness palpation to the right lower lumbar region which is moderate. Neurologic:  Normal speech and language. No gross focal neurologic deficits are appreciated.  Skin:  Skin is warm, dry and intact. No rash noted. Psychiatric: Mood and affect are normal. Speech and behavior are normal.  ____________________________________________    LABS (all labs ordered are listed, but only abnormal results are displayed)  Labs Reviewed  LIPASE, BLOOD - Abnormal; Notable for the following:       Result Value   Lipase 57 (*)    All other components within normal limits  COMPREHENSIVE METABOLIC PANEL - Abnormal; Notable for the following:    Sodium 131 (*)    Potassium 3.2 (*)    Chloride 95 (*)    Glucose, Bld 369 (*)    Total Protein 8.3 (*)    ALT 9 (*)    All other components within normal limits  URINALYSIS, COMPLETE (UACMP) WITH MICROSCOPIC - Abnormal; Notable for the following:    Color, Urine YELLOW (*)    APPearance CLEAR (*)    Specific Gravity, Urine 1.038 (*)    Glucose, UA >=500 (*)    Hgb urine dipstick LARGE (*)    Protein, ur 30 (*)    Squamous Epithelial /  LPF 0-5 (*)    All other components within normal limits  GLUCOSE, CAPILLARY - Abnormal; Notable for the following:    Glucose-Capillary 392 (*)    All other components within normal limits  CBC  TROPONIN I  INFLUENZA PANEL BY PCR (TYPE A & B)  POC URINE PREG, ED  POCT PREGNANCY, URINE   ____________________________________________  EKG  ED ECG REPORT I, Adaysha Dubinsky,  Youlanda Roys, the attending physician, personally viewed and interpreted this ECG.   Date: 04/13/2016  EKG Time: 2010  Rate: 102  Rhythm: sinus tach  Axis: normal  Intervals:none  ST&T Change: no st segment elevation or depression. No abnormal T-wave inversion.  ____________________________________________  RADIOLOGY   ____________________________________________   PROCEDURES  Procedure(s) performed:   Procedures  Critical Care performed:   ____________________________________________   INITIAL IMPRESSION / ASSESSMENT AND PLAN / ED COURSE  Pertinent labs & imaging results that were available during my care of the patient were reviewed by me and considered in my medical decision making (see chart for details).  ----------------------------------------- 12:43 AM  on 04/13/2016 -----------------------------------------  Patient with seemingly musculoskeletal type back pain. Tender to palpation. Negative straight leg raise bilaterally. Urine consistent with blood from menstrual bleeding. Patient with constellation of symptoms was could be a viral issue. Very reassuring lab work. Diffuse abdominal pain. No focal abdominal pain leads me to believe that she does not need a scan at this time. Patient resting calmly with her eyes closed after Ativan as well as fentanyl but still says her pain is nearly out of 10. She has had similar presentations in the past which she has demanded Dilaudid. She did ask for Dilaudid but her pain does not seem to the degree that would require this medication. At this point while she is resting comfortably discussed with her discharge, as long as her glucose comes down after second liter of fluids, with diclofenac gel as well as Zofran and Bentyl. She is understanding of the plan and willing to comply. Furthermore, the chest pain is relieved with a GI cocktail. Likely related to reflux.       ___________________________________   FINAL CLINICAL IMPRESSION(S) / ED DIAGNOSES  Hyperglycemia. Abdominal pain. Chest pain. Low back pain.     NEW MEDICATIONS STARTED DURING THIS VISIT:  New Prescriptions   No medications on file     Note:  This document was prepared using Dragon voice recognition software and may include unintentional dictation errors.    Orbie Pyo, MD 04/13/16 0047  Episodes of vomiting or diarrhea in the emergency department. The patient able tolerate GI cocktail.    Orbie Pyo, MD 04/13/16 [phone]

## 2016-06-09 ENCOUNTER — Encounter: Payer: Self-pay | Admitting: Emergency Medicine

## 2016-06-09 ENCOUNTER — Emergency Department
Admission: EM | Admit: 2016-06-09 | Discharge: 2016-06-09 | Disposition: A | Discharge status: Home / Self Care | Payer: Self-pay | Attending: Emergency Medicine | Admitting: Emergency Medicine

## 2016-06-09 DIAGNOSIS — G8929 Other chronic pain: Secondary | ICD-10-CM

## 2016-06-09 DIAGNOSIS — Z9114 Patient's other noncompliance with medication regimen: Secondary | ICD-10-CM | POA: Insufficient documentation

## 2016-06-09 DIAGNOSIS — R109 Unspecified abdominal pain: Secondary | ICD-10-CM

## 2016-06-09 DIAGNOSIS — R112 Nausea with vomiting, unspecified: Secondary | ICD-10-CM | POA: Insufficient documentation

## 2016-06-09 DIAGNOSIS — R1030 Lower abdominal pain, unspecified: Secondary | ICD-10-CM | POA: Insufficient documentation

## 2016-06-09 DIAGNOSIS — R569 Unspecified convulsions: Secondary | ICD-10-CM | POA: Insufficient documentation

## 2016-06-09 DIAGNOSIS — Z87891 Personal history of nicotine dependence: Secondary | ICD-10-CM | POA: Insufficient documentation

## 2016-06-09 DIAGNOSIS — E119 Type 2 diabetes mellitus without complications: Secondary | ICD-10-CM | POA: Insufficient documentation

## 2016-06-09 DIAGNOSIS — Z794 Long term (current) use of insulin: Secondary | ICD-10-CM | POA: Insufficient documentation

## 2016-06-09 LAB — URINALYSIS, COMPLETE (UACMP) WITH MICROSCOPIC
BILIRUBIN URINE: NEGATIVE
Glucose, UA: >=500 mg/dL — AB
KETONES UR: NEGATIVE mg/dL
Leukocytes, UA: NEGATIVE
Nitrite: NEGATIVE
PH: 8 (ref 5.0–8.0)
Protein, ur: 30 mg/dL — AB
SPECIFIC GRAVITY, URINE: 1.033 — AB (ref 1.005–1.030)

## 2016-06-09 LAB — CBC WITH DIFFERENTIAL/PLATELET
Basophils Absolute: 0.1 10*3/uL (ref 0–0.1)
Basophils Relative: 1 %
Eosinophils Absolute: 0.2 10*3/uL (ref 0–0.7)
Eosinophils Relative: 2 %
HEMATOCRIT: 36.4 % (ref 35.0–47.0)
Hemoglobin: 12.4 g/dL (ref 12.0–16.0)
LYMPHS ABS: 1.9 10*3/uL (ref 1.0–3.6)
LYMPHS PCT: 26 %
MCH: 29.1 pg (ref 26.0–34.0)
MCHC: 34 g/dL (ref 32.0–36.0)
MCV: 85.7 fL (ref 80.0–100.0)
MONO ABS: 0.4 10*3/uL (ref 0.2–0.9)
MONOS PCT: 6 %
NEUTROS ABS: 4.9 10*3/uL (ref 1.4–6.5)
NEUTROS PCT: 65 %
Platelets: 180 10*3/uL (ref 150–440)
RBC: 4.25 MIL/uL (ref 3.80–5.20)
RDW: 13.1 % (ref 11.5–14.5)
WBC: 7.5 10*3/uL (ref 3.6–11.0)

## 2016-06-09 LAB — COMPREHENSIVE METABOLIC PANEL
ALBUMIN: 3.7 g/dL (ref 3.5–5.0)
ALK PHOS: 45 U/L (ref 38–126)
ALT: 9 U/L — ABNORMAL LOW (ref 14–54)
ANION GAP: 5 (ref 5–15)
AST: 14 U/L — ABNORMAL LOW (ref 15–41)
BUN: 7 mg/dL (ref 6–20)
CHLORIDE: 103 mmol/L (ref 101–111)
CO2: 26 mmol/L (ref 22–32)
Calcium: 8.7 mg/dL — ABNORMAL LOW (ref 8.9–10.3)
Creatinine, Ser: <0.3 mg/dL — ABNORMAL LOW (ref 0.44–1.00)
Glucose, Bld: 297 mg/dL — ABNORMAL HIGH (ref 65–99)
POTASSIUM: 3.3 mmol/L — AB (ref 3.5–5.1)
SODIUM: 134 mmol/L — AB (ref 135–145)
Total Bilirubin: 0.5 mg/dL (ref 0.3–1.2)
Total Protein: 7.3 g/dL (ref 6.5–8.1)

## 2016-06-09 LAB — LIPASE, BLOOD: LIPASE: 22 U/L (ref 11–51)

## 2016-06-09 MED ORDER — ONDANSETRON 4 MG PO TBDP: ORAL_TABLET | ORAL | 0 refills | Status: DC

## 2016-06-09 MED ORDER — HALOPERIDOL LACTATE 5 MG/ML IJ SOLN
5.0000 mg | Freq: Once | INTRAMUSCULAR | Status: AC
  Administered 2016-06-09: 5 mg via INTRAVENOUS
  Filled 2016-06-09: qty 1

## 2016-06-09 MED ORDER — SODIUM CHLORIDE 0.9 % IV SOLN
1000.0000 mg | Freq: Once | INTRAVENOUS | Status: AC
  Administered 2016-06-09: 1000 mg via INTRAVENOUS
  Filled 2016-06-09: qty 10

## 2016-06-09 MED ORDER — SODIUM CHLORIDE 0.9 % IV BOLUS (SEPSIS)
1000.0000 mL | Freq: Once | INTRAVENOUS | Status: AC
  Administered 2016-06-09: 1000 mL via INTRAVENOUS

## 2016-06-09 NOTE — Discharge Instructions (Signed)
As we discussed, your workup today was reassuring.  Though we do not know exactly what is causing your symptoms, it appears that you have no emergent medical condition at this time and that you are safe to go home and follow up as recommended in this paperwork. ° °Please return immediately to the Emergency Department if you develop any new or worsening symptoms that concern you. ° °

## 2016-06-09 NOTE — ED Triage Notes (Signed)
Seizures today - noncompliant with meds d/t no income to buy them. Also diabetic with no meds - bs 283

## 2016-06-09 NOTE — ED Provider Notes (Signed)
Clinical Course as of Jun 09 1800  Sun Jun 09, 2016  1524 Assuming care from Dr. Burlene Arnt.  In short, Rachael Gray is a 36 y.o. female with a chief complaint of seizure-like activity, acute on chronic abdominal pain, acute on chronic vomiting.  Refer to the original H&P for additional details.  The current plan of care is to follow up after treatment for intractable and chronic abdominal pain and vomiting.  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  Will reassess and anticipate discharge.  [CF]  1801 Reassessed patient.  States she feels much better and wants to go home.  No more vomiting nor abdominal pain.    I gave my usual and customary return precautions.     [CF]    Clinical Course User Index [CF] Hinda Kehr, MD      Hinda Kehr, MD 06/09/16 [phone]

## 2016-06-09 NOTE — ED Provider Notes (Addendum)
[facility] Emergency Department Provider Note  ____________________________________________   I have reviewed the triage vital signs and the nursing notes.   HISTORY  Chief Complaint Seizures    HPI Rachael Gray is a 36 y.o. female He presents Patient is not taking either her diabetes medication or her seizure medication. She apparently has a history of non-compliance with both of these things. Patient also has a history of pseudoseizures. Unclear what kind of sz this was. She did not bite her tongue and was not incontient of bowel or bladder. The first thing patient aspirin and to the room as IV narcotic pain medication for her chronic gastroparesis pain.     Past Medical History:  Diagnosis Date  . Diabetes mellitus without complication ([place])   . Gastroparesis   . Lupus   . Pseudoseizures   . Seizures [facility])     Patient Active Problem List   Diagnosis Date Noted  . Hypokalemia 12/24/2015  . Anemia 12/24/2015  . Diabetes ([place]) 12/24/2015  . Gastroparesis due to DM ([place])   . Acute respiratory alkalosis 12/22/2015  . Duodenitis 12/22/2015  . Vomiting 12/16/2015  . Intractable nausea and vomiting 12/10/2014  . Nausea & vomiting 12/10/2014  . Narcotic abuse 12/09/2014  . Sepsis ([place]) 12/07/2014  . Anxiety   . Pyelonephritis 12/04/2014  . Lactic acidosis 12/04/2014    Class: Acute    Past Surgical History:  Procedure Laterality Date  . APPENDECTOMY    . CHOLECYSTECTOMY      Prior to Admission medications   Medication Sig Start Date End Date Taking? Authorizing Provider  Diclofenac Sodium 3 % GEL Place 1 application onto the skin every 12 (twelve) hours as needed. 04/13/16   Orbie Pyo, MD  dicyclomine (BENTYL) 10 MG capsule Take 1 capsule (10 mg total) by mouth 4 (four) times daily as needed for spasms. 04/13/16 04/27/16  Orbie Pyo, MD  feeding supplement (BOOST / RESOURCE BREEZE) LIQD Take 1 Container by mouth  3 (three) times daily between meals. 12/24/15   Theodoro Grist, MD  fluticasone (FLONASE) 50 MCG/ACT nasal spray Place 2 sprays into both nostrils daily. Patient not taking: Reported on 12/22/2015 12/15/15   Jami L Hagler, PA-C  insulin NPH-regular Human (NOVOLIN 70/30) (70-30) 100 UNIT/ML injection Inject 22-25 Units into the skin 2 (two) times daily with a meal. 25 units in the morning and 22 units in the evening 12/18/15   Alexis Hugelmeyer, DO  levETIRAcetam (KEPPRA) 750 MG tablet Take 1 tablet (750 mg total) by mouth 2 (two) times daily. 08/21/15   Paulette Blanch, MD  metoCLOPramide (REGLAN) 5 MG tablet Take 1 tablet (5 mg total) by mouth every 6 (six) hours as needed for nausea. 12/24/15   Theodoro Grist, MD  ondansetron (ZOFRAN) 4 MG tablet Take 1 tablet (4 mg total) by mouth daily as needed. 04/13/16   Orbie Pyo, MD  pantoprazole (PROTONIX) 40 MG tablet Take 1 tablet (40 mg total) by mouth 2 (two) times daily. 12/24/15   Theodoro Grist, MD  promethazine (PHENERGAN) 12.5 MG tablet Take 1 tablet (12.5 mg total) by mouth every 6 (six) hours as needed for nausea or vomiting. 12/24/15   Theodoro Grist, MD    Allergies Doxycycline; Morphine and related; and Tylenol [acetaminophen]  Family History  Problem Relation Age of Onset  . Breast cancer Maternal Grandmother     Social History Social History  Substance Use Topics  . Smoking status: Former Smoker  Packs/day: 1.00    Types: Cigarettes  . Smokeless tobacco: Never Used  . Alcohol use No    Review of Systems Constitutional: No fever/chills Eyes: No visual changes. ENT: No sore throat. No stiff neck no neck pain Cardiovascular: Denies chest pain. Respiratory: Denies shortness of breath. Gastrointestinal:   The history of present illness.  No constipation. Genitourinary: Negative for dysuria. Musculoskeletal: Negative lower extremity swelling Skin: Negative for rash. Neurological: Negative for severe headaches, focal weakness  or numbness. 10-point ROS otherwise negative.  ____________________________________________   PHYSICAL EXAM:  VITAL SIGNS: ED Triage Vitals  Enc Vitals Group     BP 06/09/16 1327 107/73     Pulse Rate 06/09/16 1327 90     Resp 06/09/16 1327 20     Temp 06/09/16 1327 98.2 F (36.8 C)     Temp Source 06/09/16 1327 Oral     SpO2 06/09/16 1327 100 %     Weight --      Height --      Head Circumference --      Peak Flow --      Pain Score 06/09/16 1325 10     Pain Loc --      Pain Edu? --      Excl. in [place]? --     Constitutional: Alert and oriented. Well appearing and in no acute distress. Eyes: Conjunctivae are normal. PERRL. EOMI. Head: Atraumatic. Nose: No congestion/rhinnorhea. Mouth/Throat: Mucous membranes are moist.  Oropharynx non-erythematous. Neck: No stridor.   Nontender with no meningismus Cardiovascular: Normal rate, regular rhythm. Grossly normal heart sounds.  Good peripheral circulation. Respiratory: Normal respiratory effort.  No retractions. Lungs CTAB. Abdominal: Soft and Minimal discharge will epigastric abdominal discomfort. No distention. No guarding no rebound Back:  There is no focal tenderness or step off.  there is no midline tenderness there are no lesions noted. there is no CVA tenderness Musculoskeletal: No lower extremity tenderness, no upper extremity tenderness. No joint effusions, no DVT signs strong distal pulses no edema Neurologic:  Normal speech and language. No gross focal neurologic deficits are appreciated.  Skin:  Skin is warm, dry and intact. No rash noted. Psychiatric: Mood and affect are normal. Speech and behavior are normal.  ____________________________________________   LABS (all labs ordered are listed, but only abnormal results are displayed)  Labs Reviewed  COMPREHENSIVE METABOLIC PANEL - Abnormal; Notable for the following:       Result Value   Sodium 134 (*)    Potassium 3.3 (*)    Glucose, Bld 297 (*)    Creatinine,  Ser <0.30 (*)    Calcium 8.7 (*)    AST 14 (*)    ALT 9 (*)    All other components within normal limits  CBC WITH DIFFERENTIAL/PLATELET  URINALYSIS, COMPLETE (UACMP) WITH MICROSCOPIC  LIPASE, BLOOD  CBG MONITORING, ED   ____________________________________________  EKG  I personally interpreted any EKGs ordered by me or triage  ____________________________________________  RADIOLOGY  I reviewed any imaging ordered by me or triage that were performed during my shift and, if possible, patient and/or family made aware of any abnormal findings. ____________________________________________   PROCEDURES  Procedure(s) performed: None  Procedures  Critical Care performed: None  ecg ----------------------------------------- 12:32 PM on 07/06/2016 -----------------------------------------   Late entry: nsr 85 bpm , non spec st changes ____________________________________________   INITIAL IMPRESSION / ASSESSMENT AND PLAN / ED COURSE  Pertinent labs & imaging results that were available during my care of the patient  were reviewed by me and considered in my medical decision making (see chart for details).  Patient here after either a seizure or pseudoseizure. There are multiple different issues with this patient which are all chronic. Most of the behavioral related. For example, she does not take her Keppra nor does she take her insulin. Her sugar here is only 283. We will check to see if she has an anion gap although there is a low suspicion for diabetic ketoacidosis. We'll give her IV fluids. Patient also had a seizure and/or a seizure today. She cannot give him any details on this. She is well-appearing nonfocal. I am loading her Keppra. Patient is very angry and upset because she is getting Keppra which "I don't need" instead of narcotic pain medications, specifically Dilaudid, which she feels she does need at this time. There is no evidence of acute surgical abdomen. Patient  is a multiple different visits for "gastroparesis" it is not clear what might exist between gastroparesis and seizure or pseudoseizure. In any event, we will defer narcotic pain medication at this time is my hope that IV fluid will help the patient which we will administer.Marland Kitchen ----------------------------------------- 3:00 PM on 06/09/2016 -----------------------------------------  And agree with nursing and other staff because she is not receiving Dilaudid. We will try Haldol. This often can serve as a good antiemetics as well as pain control.   Clinical Course as of Jul 06 1229  Sun Jun 09, 2016  1524 Assuming care from Dr. Burlene Arnt.  In short, Breelyn L Zaffino is a 36 y.o. female with a chief complaint of seizure-like activity, acute on chronic abdominal pain, acute on chronic vomiting.  Refer to the original H&P for additional details.  The current plan of care is to follow up after treatment for intractable and chronic abdominal pain and vomiting.  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  Will reassess and anticipate discharge.  [CF]  1801 Reassessed patient.  States she feels much better and wants to go home.  No more vomiting nor abdominal pain.    I gave my usual and customary return precautions.     [CF]    Clinical Course User Index [CF] Hinda Kehr, MD   ____________________________________________   FINAL CLINICAL IMPRESSION(S) / ED DIAGNOSES  Final diagnoses:  None      This chart was dictated using voice recognition software.  Despite best efforts to proofread,  errors can occur which can change meaning.      Schuyler Amor, MD 06/09/16 1441    Schuyler Amor, MD 06/09/16 [place], MD 06/09/16 [place], MD 07/06/16 [phone]

## 2016-06-09 NOTE — ED Notes (Signed)
Pt also c/o abd pain that started yesterday and is like her gastroparesis. States she has not had any medication for it in awhile and that only certain meds work.

## 2016-06-13 ENCOUNTER — Observation Stay: Payer: Self-pay

## 2016-06-13 ENCOUNTER — Encounter: Payer: Self-pay | Admitting: Emergency Medicine

## 2016-06-13 ENCOUNTER — Inpatient Hospital Stay
Admission: EM | Admit: 2016-06-13 | Discharge: 2016-06-18 | DRG: 074 | Disposition: A | Discharge status: Home / Self Care | Payer: Self-pay | Attending: Internal Medicine | Admitting: Internal Medicine

## 2016-06-13 DIAGNOSIS — F445 Conversion disorder with seizures or convulsions: Secondary | ICD-10-CM | POA: Diagnosis present

## 2016-06-13 DIAGNOSIS — F1290 Cannabis use, unspecified, uncomplicated: Secondary | ICD-10-CM | POA: Diagnosis present

## 2016-06-13 DIAGNOSIS — Z881 Allergy status to other antibiotic agents status: Secondary | ICD-10-CM

## 2016-06-13 DIAGNOSIS — Z885 Allergy status to narcotic agent status: Secondary | ICD-10-CM

## 2016-06-13 DIAGNOSIS — E876 Hypokalemia: Secondary | ICD-10-CM | POA: Diagnosis not present

## 2016-06-13 DIAGNOSIS — E871 Hypo-osmolality and hyponatremia: Secondary | ICD-10-CM | POA: Diagnosis present

## 2016-06-13 DIAGNOSIS — Z9114 Patient's other noncompliance with medication regimen: Secondary | ICD-10-CM

## 2016-06-13 DIAGNOSIS — E1165 Type 2 diabetes mellitus with hyperglycemia: Secondary | ICD-10-CM | POA: Diagnosis present

## 2016-06-13 DIAGNOSIS — L03221 Cellulitis of neck: Secondary | ICD-10-CM | POA: Diagnosis present

## 2016-06-13 DIAGNOSIS — Z79899 Other long term (current) drug therapy: Secondary | ICD-10-CM

## 2016-06-13 DIAGNOSIS — R109 Unspecified abdominal pain: Secondary | ICD-10-CM

## 2016-06-13 DIAGNOSIS — R739 Hyperglycemia, unspecified: Secondary | ICD-10-CM

## 2016-06-13 DIAGNOSIS — Z794 Long term (current) use of insulin: Secondary | ICD-10-CM

## 2016-06-13 DIAGNOSIS — Z9119 Patient's noncompliance with other medical treatment and regimen: Secondary | ICD-10-CM

## 2016-06-13 DIAGNOSIS — H9319 Tinnitus, unspecified ear: Secondary | ICD-10-CM | POA: Diagnosis present

## 2016-06-13 DIAGNOSIS — D72829 Elevated white blood cell count, unspecified: Secondary | ICD-10-CM | POA: Diagnosis present

## 2016-06-13 DIAGNOSIS — E1143 Type 2 diabetes mellitus with diabetic autonomic (poly)neuropathy: Principal | ICD-10-CM | POA: Diagnosis present

## 2016-06-13 DIAGNOSIS — M329 Systemic lupus erythematosus, unspecified: Secondary | ICD-10-CM | POA: Diagnosis present

## 2016-06-13 DIAGNOSIS — G8929 Other chronic pain: Secondary | ICD-10-CM | POA: Diagnosis present

## 2016-06-13 DIAGNOSIS — Z886 Allergy status to analgesic agent status: Secondary | ICD-10-CM

## 2016-06-13 DIAGNOSIS — K2970 Gastritis, unspecified, without bleeding: Secondary | ICD-10-CM | POA: Diagnosis present

## 2016-06-13 DIAGNOSIS — K3184 Gastroparesis: Secondary | ICD-10-CM | POA: Diagnosis present

## 2016-06-13 DIAGNOSIS — R112 Nausea with vomiting, unspecified: Secondary | ICD-10-CM | POA: Diagnosis present

## 2016-06-13 DIAGNOSIS — Z87891 Personal history of nicotine dependence: Secondary | ICD-10-CM

## 2016-06-13 LAB — URINE DRUG SCREEN, QUALITATIVE (ARMC ONLY)
Amphetamines, Ur Screen: NOT DETECTED
BARBITURATES, UR SCREEN: NOT DETECTED
BENZODIAZEPINE, UR SCRN: POSITIVE — AB
Cannabinoid 50 Ng, Ur ~~LOC~~: POSITIVE — AB
Cocaine Metabolite,Ur ~~LOC~~: NOT DETECTED
MDMA (Ecstasy)Ur Screen: NOT DETECTED
Methadone Scn, Ur: NOT DETECTED
OPIATE, UR SCREEN: NOT DETECTED
PHENCYCLIDINE (PCP) UR S: NOT DETECTED
Tricyclic, Ur Screen: NOT DETECTED

## 2016-06-13 LAB — COMPREHENSIVE METABOLIC PANEL
ALBUMIN: 4.1 g/dL (ref 3.5–5.0)
ALK PHOS: 43 U/L (ref 38–126)
ALT: 8 U/L — AB (ref 14–54)
ANION GAP: 13 (ref 5–15)
AST: 21 U/L (ref 15–41)
BUN: 7 mg/dL (ref 6–20)
CALCIUM: 9.7 mg/dL (ref 8.9–10.3)
CHLORIDE: 101 mmol/L (ref 101–111)
CO2: 22 mmol/L (ref 22–32)
Creatinine, Ser: 0.61 mg/dL (ref 0.44–1.00)
GFR calc non Af Amer: >60 mL/min (ref >60)
GLUCOSE: 371 mg/dL — AB (ref 65–99)
Potassium: 3.5 mmol/L (ref 3.5–5.1)
SODIUM: 136 mmol/L (ref 135–145)
Total Bilirubin: 0.4 mg/dL (ref 0.3–1.2)
Total Protein: 7.7 g/dL (ref 6.5–8.1)

## 2016-06-13 LAB — URINALYSIS, ROUTINE W REFLEX MICROSCOPIC
BILIRUBIN URINE: NEGATIVE
Bacteria, UA: NONE SEEN
KETONES UR: 80 mg/dL — AB
LEUKOCYTES UA: NEGATIVE
Nitrite: NEGATIVE
PH: 5 (ref 5.0–8.0)
PROTEIN: 100 mg/dL — AB
Specific Gravity, Urine: 1.03 (ref 1.005–1.030)

## 2016-06-13 LAB — CBC WITH DIFFERENTIAL/PLATELET
Basophils Absolute: 0.1 10*3/uL (ref 0–0.1)
Basophils Relative: 1 %
Eosinophils Absolute: 0.1 10*3/uL (ref 0–0.7)
Eosinophils Relative: 1 %
HCT: 38.5 % (ref 35.0–47.0)
HEMOGLOBIN: 13.2 g/dL (ref 12.0–16.0)
LYMPHS ABS: 2.4 10*3/uL (ref 1.0–3.6)
Lymphocytes Relative: 20 %
MCH: 29.1 pg (ref 26.0–34.0)
MCHC: 34.2 g/dL (ref 32.0–36.0)
MCV: 85.3 fL (ref 80.0–100.0)
MONOS PCT: 5 %
Monocytes Absolute: 0.6 10*3/uL (ref 0.2–0.9)
NEUTROS ABS: 9.2 10*3/uL — AB (ref 1.4–6.5)
NEUTROS PCT: 73 %
PLATELETS: 254 10*3/uL (ref 150–440)
RBC: 4.52 MIL/uL (ref 3.80–5.20)
RDW: 12.8 % (ref 11.5–14.5)
WBC: 12.4 10*3/uL — ABNORMAL HIGH (ref 3.6–11.0)

## 2016-06-13 LAB — GLUCOSE, CAPILLARY
GLUCOSE-CAPILLARY: 329 mg/dL — AB (ref 65–99)
Glucose-Capillary: 294 mg/dL — ABNORMAL HIGH (ref 65–99)
Glucose-Capillary: 259 mg/dL — ABNORMAL HIGH (ref 65–99)

## 2016-06-13 LAB — LIPASE, BLOOD: Lipase: 27 U/L (ref 11–51)

## 2016-06-13 MED ORDER — ACETAMINOPHEN 325 MG PO TABS: 650.0000 mg | ORAL_TABLET | Freq: Four times a day (QID) | ORAL | Status: DC | PRN

## 2016-06-13 MED ORDER — METOCLOPRAMIDE HCL 5 MG/ML IJ SOLN
10.0000 mg | Freq: Four times a day (QID) | INTRAMUSCULAR | Status: DC
  Administered 2016-06-13 – 2016-06-18 (×20): 10 mg via INTRAVENOUS
  Filled 2016-06-13 (×20): qty 2

## 2016-06-13 MED ORDER — HALOPERIDOL LACTATE 5 MG/ML IJ SOLN
2.5000 mg | Freq: Once | INTRAMUSCULAR | Status: AC
  Administered 2016-06-13: 2.5 mg via INTRAVENOUS
  Filled 2016-06-13: qty 1

## 2016-06-13 MED ORDER — ACETAMINOPHEN 650 MG RE SUPP: 650.0000 mg | Freq: Four times a day (QID) | RECTAL | Status: DC | PRN

## 2016-06-13 MED ORDER — HALOPERIDOL LACTATE 5 MG/ML IJ SOLN
5.0000 mg | Freq: Once | INTRAMUSCULAR | Status: AC
  Administered 2016-06-13 (×2): 5 mg via INTRAVENOUS

## 2016-06-13 MED ORDER — SODIUM CHLORIDE 0.9 % IV BOLUS (SEPSIS)
1000.0000 mL | INTRAVENOUS | Status: AC
  Administered 2016-06-13: 1000 mL via INTRAVENOUS

## 2016-06-13 MED ORDER — SODIUM CHLORIDE 0.9 % IV SOLN
8.0000 mg | Freq: Once | INTRAVENOUS | Status: AC
  Administered 2016-06-13: 8 mg via INTRAVENOUS
  Filled 2016-06-13: qty 4

## 2016-06-13 MED ORDER — ENOXAPARIN SODIUM 40 MG/0.4ML ~~LOC~~ SOLN
40.0000 mg | SUBCUTANEOUS | Status: DC
  Administered 2016-06-13 – 2016-06-17 (×5): 40 mg via SUBCUTANEOUS
  Filled 2016-06-13 (×5): qty 0.4

## 2016-06-13 MED ORDER — ONDANSETRON HCL 4 MG/2ML IJ SOLN
4.0000 mg | Freq: Four times a day (QID) | INTRAMUSCULAR | Status: DC | PRN
  Administered 2016-06-13 – 2016-06-16 (×6): 4 mg via INTRAVENOUS
  Filled 2016-06-13 (×6): qty 2

## 2016-06-13 MED ORDER — INSULIN ASPART 100 UNIT/ML ~~LOC~~ SOLN
0.0000 [IU] | Freq: Every day | SUBCUTANEOUS | Status: DC
  Administered 2016-06-13 – 2016-06-15 (×2): 3 [IU] via SUBCUTANEOUS
  Administered 2016-06-16: 21:00:00 2 [IU] via SUBCUTANEOUS
  Administered 2016-06-17: 3 [IU] via SUBCUTANEOUS
  Filled 2016-06-13: qty 2
  Filled 2016-06-13 (×3): qty 3

## 2016-06-13 MED ORDER — POTASSIUM CHLORIDE IN NACL 20-0.9 MEQ/L-% IV SOLN
INTRAVENOUS | Status: DC
  Administered 2016-06-14 – 2016-06-17 (×6): via INTRAVENOUS
  Filled 2016-06-13 (×9): qty 1000

## 2016-06-13 MED ORDER — INSULIN ASPART 100 UNIT/ML ~~LOC~~ SOLN
0.0000 [IU] | Freq: Three times a day (TID) | SUBCUTANEOUS | Status: DC
  Administered 2016-06-13 – 2016-06-14 (×3): 5 [IU] via SUBCUTANEOUS
  Administered 2016-06-14 – 2016-06-15 (×2): 2 [IU] via SUBCUTANEOUS
  Administered 2016-06-15 (×2): 3 [IU] via SUBCUTANEOUS
  Administered 2016-06-16: 12:00:00 1 [IU] via SUBCUTANEOUS
  Administered 2016-06-16: 7 [IU] via SUBCUTANEOUS
  Administered 2016-06-16: 2 [IU] via SUBCUTANEOUS
  Administered 2016-06-17 (×2): 3 [IU] via SUBCUTANEOUS
  Administered 2016-06-17: 18:00:00 2 [IU] via SUBCUTANEOUS
  Administered 2016-06-18: 1 [IU] via SUBCUTANEOUS
  Administered 2016-06-18: 7 [IU] via SUBCUTANEOUS
  Filled 2016-06-13: qty 2
  Filled 2016-06-13: qty 3
  Filled 2016-06-13: qty 5
  Filled 2016-06-13: qty 3
  Filled 2016-06-13: qty 5
  Filled 2016-06-13: qty 2
  Filled 2016-06-13: qty 7
  Filled 2016-06-13: qty 2
  Filled 2016-06-13: qty 3
  Filled 2016-06-13: qty 7
  Filled 2016-06-13 (×2): qty 1
  Filled 2016-06-13: qty 3
  Filled 2016-06-13: qty 2
  Filled 2016-06-13: qty 5
  Filled 2016-06-13: qty 1

## 2016-06-13 MED ORDER — ONDANSETRON HCL 4 MG PO TABS: 4.0000 mg | ORAL_TABLET | Freq: Four times a day (QID) | ORAL | Status: DC | PRN

## 2016-06-13 MED ORDER — KETOROLAC TROMETHAMINE 30 MG/ML IJ SOLN
15.0000 mg | Freq: Three times a day (TID) | INTRAMUSCULAR | Status: DC | PRN
  Administered 2016-06-13 – 2016-06-14 (×4): 15 mg via INTRAVENOUS
  Filled 2016-06-13 (×4): qty 1

## 2016-06-13 MED ORDER — HALOPERIDOL LACTATE 5 MG/ML IJ SOLN
INTRAMUSCULAR | Status: AC
  Administered 2016-06-13: 5 mg via INTRAVENOUS
  Filled 2016-06-13: qty 1

## 2016-06-13 MED ORDER — METOCLOPRAMIDE HCL 5 MG/ML IJ SOLN
10.0000 mg | Freq: Once | INTRAMUSCULAR | Status: AC
  Administered 2016-06-13: 10 mg via INTRAVENOUS
  Filled 2016-06-13: qty 2

## 2016-06-13 MED ORDER — DIPHENHYDRAMINE HCL 50 MG/ML IJ SOLN
25.0000 mg | INTRAMUSCULAR | Status: AC
  Administered 2016-06-13: 25 mg via INTRAVENOUS
  Filled 2016-06-13: qty 1

## 2016-06-13 NOTE — ED Notes (Signed)
Patient requesting water. MD Williams aware. Patient now resting.

## 2016-06-13 NOTE — ED Provider Notes (Signed)
Patient with intractable vomiting and pseudoseizures. Possible gastroparesis, she does have hyperglycemia but no signs of DKA. She is unimproved after multiple doses of IV antiemetics and antipsychotics. We will discuss with the hospitalist for admission.   Earleen Newport, MD 06/13/16 1120

## 2016-06-13 NOTE — ED Triage Notes (Signed)
Pt to ED via EMS from home c/o seizures x3 tonight.  Pt presents A&Ox4, speaking in complete and coherent sentences, shaking.  EMS administered 2mg  versed, 4mg  zofran, CBG 404, 148/94 BP, 104 HR, 99% RA.

## 2016-06-13 NOTE — ED Notes (Signed)
Attempted to call Report. RN Jinny Blossom will call back

## 2016-06-13 NOTE — ED Provider Notes (Signed)
[facility] Emergency Department Provider Note  ____________________________________________   None    (approximate)  I have reviewed the triage vital signs and the nursing notes.   HISTORY  Chief Complaint Seizures  Level V caveat: The patient is minimally cooperative with exam and history  HPI Rachael Gray is a 36 y.o. female well-known to this emergency department and this provider for multiple prior and recent visits for seizure-like activity, chronic abdominal pain/gastroparesis.  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 Weber Cooks thinks that her prescription for Keppra was given out of "an abundance of caution" given that she does not have epileptiform activity and that she can stop her seizuresat will and with the application of noxious stimuli such as sternal rubs.  He also commented previously that she seems to have seizure-like activity when he is having difficulty dealing with stressful situations in her life such as her chronic pain.  She presents this morning by EMS for additional seizure-like activity at home.  She has reportedly had 3 separate episodes as well as episodes of vomiting and persistent generalized abdominal pain.  When she arrived she was shaking all over in a nonfocal manner.  She responded to commands throughout.  I applied a sternal rub and she immediately stopped seizing, grabbed my hand with both of her hands and through my arm away and demanded that I stop, then resumed her shaking.  I asked her to please stop and she minimizes the symptoms enough to answer some basic questions.  She states that she cannot deal with her gastroparesis and it makes her sees.  She describes it as the pain causing the problem.  Nothing is different from usual.  She has not been taking her diabetes medicines nor her Keppra.  She  was last seen in the emergency department 4 days ago for the same thing and had a normal laboratory workup at that time.  She describes her symptoms as severe and nothing is making them better nor worse.  Past Medical History:  Diagnosis Date  . Diabetes mellitus without complication ([place])   . Gastroparesis   . Lupus   . Pseudoseizures   . Seizures [facility])     Patient Active Problem List   Diagnosis Date Noted  . Hypokalemia 12/24/2015  . Anemia 12/24/2015  . Diabetes ([place]) 12/24/2015  . Gastroparesis due to DM ([place])   . Acute respiratory alkalosis 12/22/2015  . Duodenitis 12/22/2015  . Vomiting 12/16/2015  . Intractable nausea and vomiting 12/10/2014  . Nausea & vomiting 12/10/2014  . Narcotic abuse 12/09/2014  . Sepsis ([place]) 12/07/2014  . Anxiety   . Pyelonephritis 12/04/2014  . Lactic acidosis 12/04/2014    Class: Acute    Past Surgical History:  Procedure Laterality Date  . APPENDECTOMY    . CHOLECYSTECTOMY      Prior to Admission medications   Medication Sig Start Date End Date Taking? Authorizing Provider  Diclofenac Sodium 3 % GEL Place 1 application onto the skin every 12 (twelve) hours as needed. Patient not taking: Reported on 06/09/2016 04/13/16   Orbie Pyo, MD  dicyclomine (BENTYL) 10 MG capsule Take 1 capsule (10 mg total) by mouth 4 (four) times daily as needed for spasms. Patient not taking: Reported on 06/09/2016 04/13/16 04/27/16  Orbie Pyo, MD  feeding supplement (BOOST / RESOURCE BREEZE) LIQD Take 1  Container by mouth 3 (three) times daily between meals. Patient not taking: Reported on 06/09/2016 12/24/15   Theodoro Grist, MD  fluticasone (FLONASE) 50 MCG/ACT nasal spray Place 2 sprays into both nostrils daily. Patient not taking: Reported on 12/22/2015 12/15/15   Jami L Hagler, PA-C  insulin NPH-regular Human (NOVOLIN 70/30) (70-30) 100 UNIT/ML injection Inject 22-25 Units into the skin 2 (two) times daily with a meal. 25 units in the  morning and 22 units in the evening Patient not taking: Reported on 06/09/2016 12/18/15   Alexis Hugelmeyer, DO  levETIRAcetam (KEPPRA) 750 MG tablet Take 1 tablet (750 mg total) by mouth 2 (two) times daily. Patient not taking: Reported on 06/09/2016 08/21/15   Paulette Blanch, MD  metoCLOPramide (REGLAN) 5 MG tablet Take 1 tablet (5 mg total) by mouth every 6 (six) hours as needed for nausea. Patient not taking: Reported on 06/09/2016 12/24/15   Theodoro Grist, MD  ondansetron (ZOFRAN ODT) 4 MG disintegrating tablet Allow 1-2 tablets to dissolve in your mouth every 8 hours as needed for nausea/vomiting 06/09/16   Hinda Kehr, MD  ondansetron (ZOFRAN) 4 MG tablet Take 1 tablet (4 mg total) by mouth daily as needed. Patient not taking: Reported on 06/09/2016 04/13/16   Orbie Pyo, MD  pantoprazole (PROTONIX) 40 MG tablet Take 1 tablet (40 mg total) by mouth 2 (two) times daily. Patient not taking: Reported on 06/09/2016 12/24/15   Theodoro Grist, MD  promethazine (PHENERGAN) 12.5 MG tablet Take 1 tablet (12.5 mg total) by mouth every 6 (six) hours as needed for nausea or vomiting. Patient not taking: Reported on 06/09/2016 12/24/15   Theodoro Grist, MD    Allergies Doxycycline; Morphine and related; and Tylenol [acetaminophen]  Family History  Problem Relation Age of Onset  . Breast cancer Maternal Grandmother     Social History Social History  Substance Use Topics  . Smoking status: Former Smoker    Packs/day: 1.00    Types: Cigarettes  . Smokeless tobacco: Never Used  . Alcohol use No    Review of Systems Constitutional: No fever/chills Eyes: No visual changes. ENT: No sore throat. Cardiovascular: Denies chest pain. Respiratory: Denies shortness of breath. Gastrointestinal: Chronic abdominal pain with N/V. Genitourinary: Negative for dysuria. Musculoskeletal: Negative for back pain. Skin: Negative for rash. Neurological: Reporting seizure-like activity  10-point ROS otherwise  negative.  ____________________________________________   PHYSICAL EXAM:  VITAL SIGNS: ED Triage Vitals  Enc Vitals Group     BP 06/13/16 0516 (!) 142/95     Pulse Rate 06/13/16 0516 (!) 101     Resp 06/13/16 0516 18     Temp 06/13/16 0521 98.2 F (36.8 C)     Temp Source 06/13/16 0521 Oral     SpO2 06/13/16 0516 100 %     Weight 06/13/16 0521 137 lb (62.1 kg)     Height 06/13/16 0521 5\' 5"  (1.651 m)     Head Circumference --      Peak Flow --      Pain Score 06/13/16 0521 10     Pain Loc --      Pain Edu? --      Excl. in [place]? --     Constitutional: Alert and oriented. Generalized shaking but responds to commands and stops the seizure-like activity at will and when noxious stimuli are applied. Eyes: Conjunctivae are normal. PERRL. EOMI. Head: Atraumatic. Nose: No congestion/rhinnorhea. Mouth/Throat: Mucous membranes are dry. Neck: No stridor.  No meningeal signs.  Cardiovascular: Borderline tachycardia, regular rhythm. Good peripheral circulation. Grossly normal heart sounds. Respiratory: Normal respiratory effort.  No retractions. Lungs CTAB. Gastrointestinal: Soft and non-distended.  Mild generalized discomfort throughout abdomen with no focal tenderness. Musculoskeletal: No lower extremity tenderness nor edema. No gross deformities of extremities. Neurologic:  Normal speech and language. No gross focal neurologic deficits are appreciated.  Skin:  Skin is warm, dry and intact. No rash noted.   ____________________________________________   LABS (all labs ordered are listed, but only abnormal results are displayed)  Labs Reviewed  CBC WITH DIFFERENTIAL/PLATELET - Abnormal; Notable for the following:       Result Value   WBC 12.4 (*)    Neutro Abs 9.2 (*)    All other components within normal limits  COMPREHENSIVE METABOLIC PANEL - Abnormal; Notable for the following:    Glucose, Bld 371 (*)    ALT 8 (*)    All other components within normal limits  GLUCOSE,  CAPILLARY - Abnormal; Notable for the following:    Glucose-Capillary 329 (*)    All other components within normal limits  LIPASE, BLOOD  URINALYSIS, ROUTINE W REFLEX MICROSCOPIC  URINE DRUG SCREEN, QUALITATIVE ([facility] ONLY)  POC URINE PREG, ED   ____________________________________________  EKG  None - EKG not ordered by ED physician ____________________________________________  RADIOLOGY   No results found.  ____________________________________________   PROCEDURES  Critical Care performed: No   Procedure(s) performed:   Procedures   ____________________________________________   INITIAL IMPRESSION / ASSESSMENT AND PLAN / ED COURSE  Pertinent labs & imaging results that were available during my care of the patient were reviewed by me and considered in my medical decision making (see chart for details).  The patient is not having true seizures.  Her fingerstick blood sugar was greater than 400 and she is noncompliant with her medication regimen.  I will give her a liter of fluids both for the hyperglycemia as well as for her reported recent vomiting and decreased by mouth intake., we will check basic labs to make sure she is not in DKA nor have any other significant metabolic derangement, and I will treat her for her chronic abdominal pain/cyclic vomiting with Haldol and Benadryl which has worked well in the past for her.  I explained to her again that we would not be giving narcotic pain medication.   Clinical Course as of Jun 13 701  Thu Jun 13, 2016  0623 Labs are unremarkable except for hyperglycemia and a very mild leukocytosis.  Otherwise reassuring.  [CF]  0703 Patient is no longer complaining of pain and has no additional seizure-like activity.  Has not yet produced urine.  Tachycardia resolved.  Transferred care to Dr. Jimmye Norman to reassess and determine disposition.  [CF]    Clinical Course User Index [CF] Hinda Kehr, MD     ____________________________________________  FINAL CLINICAL IMPRESSION(S) / ED DIAGNOSES  Final diagnoses:  Pseudoseizure  Chronic abdominal pain  Nausea and vomiting, intractability of vomiting not specified, unspecified vomiting type  Hyperglycemia  Non compliance w medication regimen     MEDICATIONS GIVEN DURING THIS VISIT:  Medications  haloperidol lactate (HALDOL) injection 2.5 mg (2.5 mg Intravenous Given 06/13/16 0520)  diphenhydrAMINE (BENADRYL) injection 25 mg (25 mg Intravenous Given 06/13/16 0521)  sodium chloride 0.9 % bolus 1,000 mL (0 mLs Intravenous Stopped 06/13/16 0629)     NEW OUTPATIENT MEDICATIONS STARTED DURING THIS VISIT:  New Prescriptions   No medications on file    Modified Medications  No medications on file    Discontinued Medications   No medications on file     Note:  This document was prepared using Dragon voice recognition software and may include unintentional dictation errors.    Hinda Kehr, MD 06/13/16 [phone]

## 2016-06-13 NOTE — ED Notes (Signed)
Pt calling out for something to drink. edp aware and ordered a PO trial

## 2016-06-13 NOTE — ED Notes (Signed)
Pt up to br with a BM, pt got stool in the urine sample, so unable to send speciman at this time. Will attempt again. Pt given supplies to clean herself, bed cleaned with new linens placed. Pt back in bed with call light in reach.

## 2016-06-13 NOTE — ED Notes (Signed)
Patient placed on bedpan.

## 2016-06-13 NOTE — ED Notes (Signed)
Patient unable to urinate, taken off of bedpan, will try again later.

## 2016-06-13 NOTE — ED Notes (Signed)
Per night shift nurse edp aware of pt not able to obtain ua at this time.

## 2016-06-13 NOTE — H&P (Signed)
[place] [facility] [place] NAME: Rachael Gray    MR#:  [id number]  DATE OF BIRTH:  08-Sep-1980  DATE OF ADMISSION:  06/13/2016  PRIMARY CARE PHYSICIAN: No PCP Per Patient   REQUESTING/REFERRING PHYSICIAN: Dr. Lenise Arena  CHIEF COMPLAINT:   Intractable nausea and vomiting  HISTORY OF PRESENT ILLNESS:  Rachael Gray  is a 36 y.o. female with a known history of Diabetes type 2 without complication, history of diabetic gastroparesis, lupus, pseudoseizures, medical noncompliance who presents to the hospital due to intractable nausea and vomiting. Patient says that she's been having nausea vomiting ongoing now for the past 2-3 days. She also has associated epigastric abdominal pain but no fevers chills or any sick contacts. Patient presented to the emergency room was given multiple doses of antibiotics including Zofran, Reglan but continues to have nausea vomiting and therefore hospitalist services were contacted further treatment and evaluation. Patient's blood sugars are slightly elevated but she is not in any evidence of acute diabetic ketoacidosis and is very noncompliant is not taking any of her medications in the past 6 months.  PAST MEDICAL HISTORY:   Past Medical History:  Diagnosis Date  . Diabetes mellitus without complication ([place])   . Gastroparesis   . Lupus   . Pseudoseizures   . Seizures ([place])     PAST SURGICAL HISTORY:   Past Surgical History:  Procedure Laterality Date  . APPENDECTOMY    . CHOLECYSTECTOMY      SOCIAL HISTORY:   Social History  Substance Use Topics  . Smoking status: Former Smoker    Packs/day: 2.00    Years: 10.00    Types: Cigarettes  . Smokeless tobacco: Never Used  . Alcohol use No    FAMILY HISTORY:   Family History  Problem Relation Age of Onset  . Breast cancer Maternal Grandmother     DRUG ALLERGIES:   Allergies  Allergen Reactions  . Doxycycline Swelling  . Morphine And  Related Rash  . Tylenol [Acetaminophen] Rash    REVIEW OF SYSTEMS:   Review of Systems  Constitutional: Negative for fever and weight loss.  HENT: Negative for congestion, nosebleeds and tinnitus.   Eyes: Negative for blurred vision, double vision and redness.  Respiratory: Negative for cough, hemoptysis and shortness of breath.   Cardiovascular: Negative for chest pain, orthopnea, leg swelling and PND.  Gastrointestinal: Positive for abdominal pain, nausea and vomiting. Negative for diarrhea and melena.  Genitourinary: Negative for dysuria, hematuria and urgency.  Musculoskeletal: Negative for falls and joint pain.  Neurological: Negative for dizziness, tingling, sensory change, focal weakness, seizures, weakness and headaches.  Endo/Heme/Allergies: Negative for polydipsia. Does not bruise/bleed easily.  Psychiatric/Behavioral: Negative for depression and memory loss. The patient is not nervous/anxious.     MEDICATIONS AT HOME:   Prior to Admission medications   Medication Sig Start Date End Date Taking? Authorizing Provider  Diclofenac Sodium 3 % GEL Place 1 application onto the skin every 12 (twelve) hours as needed. Patient not taking: Reported on 06/09/2016 04/13/16   Orbie Pyo, MD  dicyclomine (BENTYL) 10 MG capsule Take 1 capsule (10 mg total) by mouth 4 (four) times daily as needed for spasms. Patient not taking: Reported on 06/09/2016 04/13/16 04/27/16  Orbie Pyo, MD  feeding supplement (BOOST / RESOURCE BREEZE) LIQD Take 1 Container by mouth 3 (three) times daily between meals. Patient not taking: Reported on 06/09/2016 12/24/15   Theodoro Grist, MD  fluticasone (FLONASE) 50  MCG/ACT nasal spray Place 2 sprays into both nostrils daily. Patient not taking: Reported on 12/22/2015 12/15/15   Jami L Hagler, PA-C  insulin NPH-regular Human (NOVOLIN 70/30) (70-30) 100 UNIT/ML injection Inject 22-25 Units into the skin 2 (two) times daily with a meal. 25 units in  the morning and 22 units in the evening Patient not taking: Reported on 06/09/2016 12/18/15   Alexis Hugelmeyer, DO  levETIRAcetam (KEPPRA) 750 MG tablet Take 1 tablet (750 mg total) by mouth 2 (two) times daily. Patient not taking: Reported on 06/09/2016 08/21/15   Paulette Blanch, MD  metoCLOPramide (REGLAN) 5 MG tablet Take 1 tablet (5 mg total) by mouth every 6 (six) hours as needed for nausea. Patient not taking: Reported on 06/09/2016 12/24/15   Theodoro Grist, MD  ondansetron (ZOFRAN ODT) 4 MG disintegrating tablet Allow 1-2 tablets to dissolve in your mouth every 8 hours as needed for nausea/vomiting 06/09/16   Hinda Kehr, MD  ondansetron (ZOFRAN) 4 MG tablet Take 1 tablet (4 mg total) by mouth daily as needed. Patient not taking: Reported on 06/09/2016 04/13/16   Orbie Pyo, MD  pantoprazole (PROTONIX) 40 MG tablet Take 1 tablet (40 mg total) by mouth 2 (two) times daily. Patient not taking: Reported on 06/09/2016 12/24/15   Theodoro Grist, MD  promethazine (PHENERGAN) 12.5 MG tablet Take 1 tablet (12.5 mg total) by mouth every 6 (six) hours as needed for nausea or vomiting. Patient not taking: Reported on 06/09/2016 12/24/15   Theodoro Grist, MD      VITAL SIGNS:  Blood pressure 117/65, pulse 72, temperature 98.2 F (36.8 C), temperature source Oral, resp. rate 16, height 5\' 5"  (1.651 m), weight 62.1 kg (137 lb), last menstrual period 06/07/2016, SpO2 100 %.  PHYSICAL EXAMINATION:  Physical Exam  GENERAL:  36 y.o.-year-old patient lying in the bed lethargic in mild GI distress.   EYES: Pupils equal, round, reactive to light and accommodation. No scleral icterus. Extraocular muscles intact.  HEENT: Head atraumatic, normocephalic. Oropharynx and nasopharynx clear. No oropharyngeal erythema, dry oral mucosa  NECK:  Supple, no jugular venous distention. No thyroid enlargement, no tenderness.  LUNGS: Normal breath sounds bilaterally, no wheezing, rales, rhonchi. No use of accessory muscles  of respiration.  CARDIOVASCULAR: S1, S2 RRR. No murmurs, rubs, gallops, clicks.  ABDOMEN: Soft, nontender, nondistended. Bowel sounds present. No organomegaly or mass.  EXTREMITIES: No pedal edema, cyanosis, or clubbing. + 2 pedal & radial pulses b/l.   NEUROLOGIC: Cranial nerves II through XII are intact. No focal Motor or sensory deficits appreciated b/l PSYCHIATRIC: The patient is alert and oriented x 3.   SKIN: No obvious rash, lesion, or ulcer.   LABORATORY PANEL:   CBC  Recent Labs Lab 06/13/16 0519  WBC 12.4*  HGB 13.2  HCT 38.5  PLT 254   ------------------------------------------------------------------------------------------------------------------  Chemistries   Recent Labs Lab 06/13/16 0519  NA 136  K 3.5  CL 101  CO2 22  GLUCOSE 371*  BUN 7  CREATININE 0.61  CALCIUM 9.7  AST 21  ALT 8*  ALKPHOS 43  BILITOT 0.4   ------------------------------------------------------------------------------------------------------------------  Cardiac Enzymes No results for input(s): TROPONINI in the last 168 hours. ------------------------------------------------------------------------------------------------------------------  RADIOLOGY:  No results found.   IMPRESSION AND PLAN:   36 year old female with past medical history of diabetes type 2 without complication, history of pseudoseizures, medical noncompliance, gastroparesis who presents to the hospital due to intractable nausea and vomiting.  1. Intractable nausea and vomiting-etiology unclear but suspected to  be secondary to gastroparesis. -Patient seems noncompliant with treatment of underlying diabetes. I will place on supportive care with IV fluids, anti-emetics. Avoid narcotics if possible. -Continue supportive calf not improving consider gastroenterology consult. I will place on scheduled Reglan.  2. Diabetes type 2 without complication-patient is noncompliant is currently not taking any  medications. -I'll place on sliding scale insulin for now.  3. History of pseudoseizures-patient was having some episodes of this when she presented to the ER but improved with a sternal rub.    All the records are reviewed and case discussed with ED provider. Management plans discussed with the patient, family and they are in agreement.  CODE STATUS: Full  TOTAL TIME TAKING CARE OF THIS PATIENT: 40 minutes.    Henreitta Leber M.D on 06/13/2016 at 11:41 AM  Between 7am to 6pm - Pager - [phone]  After 6pm go to [email or web address] - password [email or web address] [facility]  [facility]  Office  [phone]  CC: Primary care physician; No PCP Per Patient

## 2016-06-13 NOTE — ED Notes (Addendum)
Glucose Capillary/Fingerstick: 329

## 2016-06-13 NOTE — ED Notes (Signed)
Admitting in to see pt at this time.

## 2016-06-13 NOTE — ED Notes (Signed)
Pt will not keep cardiac leads and or bp cuff on.

## 2016-06-14 LAB — BASIC METABOLIC PANEL
Anion gap: 13 (ref 5–15)
BUN: 15 mg/dL (ref 6–20)
CHLORIDE: 102 mmol/L (ref 101–111)
CO2: 22 mmol/L (ref 22–32)
Calcium: 9 mg/dL (ref 8.9–10.3)
Creatinine, Ser: 0.52 mg/dL (ref 0.44–1.00)
Glucose, Bld: 263 mg/dL — ABNORMAL HIGH (ref 65–99)
Potassium: 3.5 mmol/L (ref 3.5–5.1)
SODIUM: 137 mmol/L (ref 135–145)

## 2016-06-14 LAB — CBC
HEMATOCRIT: 38.2 % (ref 35.0–47.0)
Hemoglobin: 12.7 g/dL (ref 12.0–16.0)
MCH: 28.2 pg (ref 26.0–34.0)
MCHC: 33.2 g/dL (ref 32.0–36.0)
MCV: 85.1 fL (ref 80.0–100.0)
PLATELETS: 256 10*3/uL (ref 150–440)
RBC: 4.48 MIL/uL (ref 3.80–5.20)
RDW: 13.1 % (ref 11.5–14.5)
WBC: 17.8 10*3/uL — AB (ref 3.6–11.0)

## 2016-06-14 LAB — GLUCOSE, CAPILLARY
GLUCOSE-CAPILLARY: 279 mg/dL — AB (ref 65–99)
GLUCOSE-CAPILLARY: 252 mg/dL — AB (ref 65–99)
Glucose-Capillary: 190 mg/dL — ABNORMAL HIGH (ref 65–99)

## 2016-06-14 LAB — HIV ANTIBODY (ROUTINE TESTING W REFLEX): HIV SCREEN 4TH GENERATION: NONREACTIVE

## 2016-06-14 MED ORDER — SUCRALFATE 1 G PO TABS
1.0000 g | ORAL_TABLET | Freq: Four times a day (QID) | ORAL | Status: DC
  Administered 2016-06-14 – 2016-06-17 (×9): 1 g via ORAL
  Filled 2016-06-14 (×11): qty 1

## 2016-06-14 MED ORDER — PROCHLORPERAZINE EDISYLATE 5 MG/ML IJ SOLN
10.0000 mg | Freq: Four times a day (QID) | INTRAMUSCULAR | Status: DC | PRN
  Administered 2016-06-14 – 2016-06-15 (×2): 10 mg via INTRAVENOUS
  Filled 2016-06-14 (×3): qty 2

## 2016-06-14 MED ORDER — TRAMADOL HCL 50 MG PO TABS
50.0000 mg | ORAL_TABLET | Freq: Four times a day (QID) | ORAL | Status: DC | PRN
  Administered 2016-06-14: 11:00:00 50 mg via ORAL
  Filled 2016-06-14: qty 1

## 2016-06-14 MED ORDER — DICYCLOMINE HCL 10 MG/ML IM SOLN
10.0000 mg | Freq: Once | INTRAMUSCULAR | Status: AC
  Administered 2016-06-14: 10 mg via INTRAMUSCULAR
  Filled 2016-06-14: qty 1

## 2016-06-14 MED ORDER — PANTOPRAZOLE SODIUM 40 MG PO TBEC
40.0000 mg | DELAYED_RELEASE_TABLET | Freq: Every day | ORAL | Status: DC
  Administered 2016-06-14 – 2016-06-15 (×2): 40 mg via ORAL
  Filled 2016-06-14 (×2): qty 1

## 2016-06-14 NOTE — Progress Notes (Signed)
[facility] NAME: Rachael Gray    MR#:  [id number]  DATE OF BIRTH:  03-Jan-1981  SUBJECTIVE:  CHIEF COMPLAINT:   Chief Complaint  Patient presents with  . Seizures  The patient is a 36 year old female with medical history significant for history of polysubstance abuse, diabetes, diabetic gastroparesis, lupus, pseudoseizures, medical noncompliance, who presents to the hospital with complaints of nausea, vomiting, epigastric abdominal pain. She claimed having nausea and vomiting for the past 2 or 3 days, however, patient's labs were unremarkable, x-ray was unremarkable. She was given antiemetics, IV fluids, however, did not improve significantly, still complains of significant pain, more over, she tells me that she did not smoke marijuana, or otherwise ingested marijuana for the past 2 or 3 months, however, urine drug screen was positive for benzodiazepines and marijuana. Pain medications were stopped  Review of Systems  Constitutional: Negative for chills, fever and weight loss.  HENT: Negative for congestion.   Eyes: Negative for blurred vision and double vision.  Respiratory: Negative for cough, sputum production, shortness of breath and wheezing.   Cardiovascular: Negative for chest pain, palpitations, orthopnea, leg swelling and PND.  Gastrointestinal: Positive for abdominal pain, nausea and vomiting. Negative for blood in stool, constipation and diarrhea.  Genitourinary: Negative for dysuria, frequency, hematuria and urgency.  Musculoskeletal: Negative for falls.  Neurological: Negative for dizziness, tremors, focal weakness and headaches.  Endo/Heme/Allergies: Does not bruise/bleed easily.  Psychiatric/Behavioral: Negative for depression. The patient does not have insomnia.     VITAL SIGNS: Blood pressure 140/69, pulse 88, temperature 98.2 F (36.8 C), temperature source Oral, resp. rate 18, height 5\' 5"  (1.651 m),  weight 63.4 kg (139 lb 11.2 oz), last menstrual period 06/07/2016, SpO2 100 %.  PHYSICAL EXAMINATION:   GENERAL:  36 y.o.-year-old patient lying in the bed in moderate distress, grimacing, uncomfortable, ringing in the bed claiming she was in severe discomfort .  EYES: Pupils equal, round, reactive to light and accommodation. No scleral icterus. Extraocular muscles intact.  HEENT: Head atraumatic, normocephalic. Oropharynx and nasopharynx clear.  NECK:  Supple, no jugular venous distention. No thyroid enlargement, no tenderness.  LUNGS: Normal breath sounds bilaterally, no wheezing, rales,rhonchi or crepitation. No use of accessory muscles of respiration.  CARDIOVASCULAR: S1, S2 normal. No murmurs, rubs, or gallops.  ABDOMEN: Soft, nontender, nondistended. Bowel sounds present. No organomegaly or mass.  EXTREMITIES: No pedal edema, cyanosis, or clubbing.  NEUROLOGIC: Cranial nerves II through XII are intact. Muscle strength 5/5 in all extremities. Sensation intact. Gait not checked.  PSYCHIATRIC: The patient is alert and oriented x 3.  SKIN: No obvious rash, lesion, or ulcer.   ORDERS/RESULTS REVIEWED:   CBC  Recent Labs Lab 06/09/16 1339 06/13/16 0519 06/14/16 0418  WBC 7.5 12.4* 17.8*  HGB 12.4 13.2 12.7  HCT 36.4 38.5 38.2  PLT 180 254 256  MCV 85.7 85.3 85.1  MCH 29.1 29.1 28.2  MCHC 34.0 34.2 33.2  RDW 13.1 12.8 13.1  LYMPHSABS 1.9 2.4  --   MONOABS 0.4 0.6  --   EOSABS 0.2 0.1  --   BASOSABS 0.1 0.1  --    ------------------------------------------------------------------------------------------------------------------  Chemistries   Recent Labs Lab 06/09/16 1339 06/13/16 0519 06/14/16 0418  NA 134* 136 137  K 3.3* 3.5 3.5  CL 103 101 102  CO2 26 22 22   GLUCOSE 297* 371* 263*  BUN 7 7 15   CREATININE <0.30* 0.61 0.52  CALCIUM 8.7* 9.7  9.0  AST 14* 21  --   ALT 9* 8*  --   ALKPHOS 45 43  --   BILITOT 0.5 0.4  --     ------------------------------------------------------------------------------------------------------------------ estimated creatinine clearance is 89.2 mL/min (by C-G formula based on SCr of 0.52 mg/dL). ------------------------------------------------------------------------------------------------------------------ No results for input(s): TSH, T4TOTAL, T3FREE, THYROIDAB in the last 72 hours.  Invalid input(s): FREET3  Cardiac Enzymes No results for input(s): CKMB, TROPONINI, MYOGLOBIN in the last 168 hours.  Invalid input(s): CK ------------------------------------------------------------------------------------------------------------------ Invalid input(s): POCBNP ---------------------------------------------------------------------------------------------------------------  RADIOLOGY: Dg Abd 2 Views  Result Date: 06/13/2016 CLINICAL DATA:  Left-sided abdominal pain for 2 weeks. EXAM: ABDOMEN - 2 VIEW COMPARISON:  CT scan of December 22, 2015. FINDINGS: The bowel gas pattern is normal. There is no evidence of free air. No radio-opaque calculi or other significant radiographic abnormality is seen. IMPRESSION: No evidence of bowel obstruction or ileus. Electronically Signed   By: Marijo Conception, M.D.   On: 06/13/2016 15:19    EKG:  Orders placed or performed during the hospital encounter of 06/09/16  . EKG 12-Lead  . EKG 12-Lead    ASSESSMENT AND PLAN:  Active Problems:   Intractable nausea and vomiting  #1. Intractable nausea and vomiting, unfortunately, I do not see no vomiting in her vomitus bag, continue patient on current antiemetics , discontinue pain medications #2. Hyponatremia, resolved #3. Leukocytosis, possibly stress related, no obvious infection, following morning #4. Diabetes mellitus, continue Sliding scale insulin #5. Polysubstance abuse, urine drug screen positive for benzodiazepines and cannabinoids, discussed with patient, she claimed using cannabis  few months ago, I told her that we would not initiate her on pain medications due to her drug abuse history  Management plans discussed with the patient, family and they are in agreement.   DRUG ALLERGIES:  Allergies  Allergen Reactions  . Doxycycline Swelling  . Morphine And Related Rash  . Tylenol [Acetaminophen] Rash    CODE STATUS:     Code Status Orders        Start     Ordered   06/13/16 1446  Full code  Continuous     06/13/16 1445    Code Status History    Date Active Date Inactive Code Status Order ID Comments User Context   12/22/2015  2:09 PM 12/24/2015  6:40 PM Full Code [id number]  Vaughan Basta, MD Inpatient   12/16/2015 10:03 AM 12/18/2015  8:10 PM Full Code [id number]  Baxter Hire, MD Inpatient   12/10/2014  6:01 PM 12/11/2014  2:54 PM Full Code [id number]  Idelle Crouch, MD Inpatient   12/04/2014  8:23 PM 12/07/2014  3:32 PM Full Code [id number]  Demetrios Loll, MD Inpatient      TOTAL TIME TAKING CARE OF THIS PATIENT: 35  minutes.    Theodoro Grist M.D on 06/14/2016 at 3:47 PM  Between 7am to 6pm - Pager - [phone]  After 6pm go to [email or web address] - password [email or web address] [facility]  [facility]  Office  [phone]  CC: Primary care physician; No PCP Per Patient

## 2016-06-14 NOTE — Plan of Care (Signed)
Problem: Pain Managment: Goal: General experience of comfort will improve Outcome: Not Progressing Pt cont to c/o abd pain. No  Vomiting  Noted  By me

## 2016-06-14 NOTE — Progress Notes (Addendum)
Inpatient Diabetes Program Recommendations  AACE/ADA: New Consensus Statement on Inpatient Glycemic Control (2015)  Target Ranges:  Prepandial:   less than 140 mg/dL      Peak postprandial:   less than 180 mg/dL (1-2 hours)      Critically ill patients:  140 - 180 mg/dL   Lab Results  Component Value Date   GLUCAP 279 (H) 06/14/2016   HGBA1C 10.7 (H) 12/04/2014    Review of Glycemic Control  Results for Rachael Gray, Rachael Gray (MRN [id number]) as of 06/14/2016 09:56  Ref. Range 06/13/2016 05:21 06/13/2016 16:33 06/13/2016 22:23  Glucose-Capillary Latest Ref Range: 65 - 99 mg/dL 329 (H) 294 (H) 259 (H)    Diabetes history: Type 2 Outpatient Diabetes medications: Novolin 70/30 25 units qam, 22 units q evening- NOT TAKING  Current orders for Inpatient glycemic control: Novolog 0-9 units tid, Novolog 0-5 units   Inpatient Diabetes Program Recommendations:  Consider adding Levemir 5 units bid and Novolog 2 units tid (hold if patient eats less than 50%)   Continue Novolog correction as ordered.   Per ADA recommendations "consider performing an A1C on all patients with diabetes or hyperglycemia admitted to the hospital if not performed in the prior 3 months".  Gentry Fitz, RN, BA, MHA, CDE Diabetes Coordinator Inpatient Diabetes Program  [phone] (Team Pager) [phone] ([place]) 06/14/2016 10:08 AM

## 2016-06-15 ENCOUNTER — Observation Stay: Payer: Self-pay

## 2016-06-15 ENCOUNTER — Encounter: Payer: Self-pay | Admitting: Radiology

## 2016-06-15 LAB — GLUCOSE, CAPILLARY
GLUCOSE-CAPILLARY: 185 mg/dL — AB (ref 65–99)
GLUCOSE-CAPILLARY: 247 mg/dL — AB (ref 65–99)
GLUCOSE-CAPILLARY: 203 mg/dL — AB (ref 65–99)
Glucose-Capillary: 204 mg/dL — ABNORMAL HIGH (ref 65–99)
Glucose-Capillary: 170 mg/dL — ABNORMAL HIGH (ref 65–99)
Glucose-Capillary: 239 mg/dL — ABNORMAL HIGH (ref 65–99)

## 2016-06-15 LAB — CBC
HCT: 36.9 % (ref 35.0–47.0)
HEMOGLOBIN: 12.3 g/dL (ref 12.0–16.0)
MCH: 28.4 pg (ref 26.0–34.0)
MCHC: 33.2 g/dL (ref 32.0–36.0)
MCV: 85.6 fL (ref 80.0–100.0)
Platelets: 257 10*3/uL (ref 150–440)
RBC: 4.31 MIL/uL (ref 3.80–5.20)
RDW: 13.2 % (ref 11.5–14.5)
WBC: 16.3 10*3/uL — ABNORMAL HIGH (ref 3.6–11.0)

## 2016-06-15 LAB — HCG, QUANTITATIVE, PREGNANCY: hCG, Beta Chain, Quant, S: 1 m[IU]/mL (ref <5)

## 2016-06-15 MED ORDER — DIPHENHYDRAMINE HCL 25 MG PO CAPS
25.0000 mg | ORAL_CAPSULE | Freq: Every evening | ORAL | Status: DC | PRN
  Filled 2016-06-15: qty 1

## 2016-06-15 MED ORDER — PANTOPRAZOLE SODIUM 40 MG IV SOLR
40.0000 mg | Freq: Two times a day (BID) | INTRAVENOUS | Status: DC
  Administered 2016-06-15 – 2016-06-18 (×7): 40 mg via INTRAVENOUS
  Filled 2016-06-15 (×7): qty 40

## 2016-06-15 MED ORDER — HYDROMORPHONE HCL 1 MG/ML IJ SOLN
0.2500 mg | Freq: Once | INTRAMUSCULAR | Status: AC
  Administered 2016-06-15: 07:00:00 0.25 mg via INTRAVENOUS
  Filled 2016-06-15: qty 0.5

## 2016-06-15 MED ORDER — LORAZEPAM 2 MG/ML IJ SOLN
1.0000 mg | Freq: Once | INTRAMUSCULAR | Status: AC
  Administered 2016-06-15: 1 mg via INTRAVENOUS
  Filled 2016-06-15: qty 1

## 2016-06-15 MED ORDER — HYDROMORPHONE HCL 1 MG/ML IJ SOLN
1.0000 mg | INTRAMUSCULAR | Status: DC | PRN
Start: 2016-06-15 — End: 2016-06-16
  Administered 2016-06-15 – 2016-06-16 (×5): 1 mg via INTRAVENOUS
  Filled 2016-06-15 (×6): qty 1

## 2016-06-15 MED ORDER — IOPAMIDOL (ISOVUE-300) INJECTION 61%
100.0000 mL | Freq: Once | INTRAVENOUS | Status: AC | PRN
  Administered 2016-06-15: 100 mL via INTRAVENOUS

## 2016-06-15 MED ORDER — DIPHENHYDRAMINE HCL 50 MG/ML IJ SOLN
25.0000 mg | Freq: Every evening | INTRAMUSCULAR | Status: DC | PRN
  Administered 2016-06-15: 04:00:00 25 mg via INTRAVENOUS
  Filled 2016-06-15: qty 1

## 2016-06-15 NOTE — Consult Note (Signed)
Consultation  Referring Provider:     No ref. provider found Primary Care Physician:  No PCP Per Patient Primary Gastroenterologist: None         Reason for Consultation:     Intractable nausea/vomiting  Date of Admission:  06/13/2016 Date of Consultation:  06/15/2016         HPI:   Rachael Gray is a 36 y.o. female with uncontrolled Diabetes type 2, history of diabetic gastroparesis, lupus, pseudoseizures, medical noncompliance who presents to the hospital due to intractable nausea and vomiting. Patient says that she's been having nausea vomiting ongoing now for the past 2-3 days. She also has associated upper abdominal pain. Her blood glucose on admission was > 500. She said she was not taking insulin as she could not afford. She is not in DKA. Has been have nausea, bilious emesis, making her abdominal pain worse. Her LFTs and lipase were normal on admission. She was very drowsy when I saw her and was unable to give proper history as received dilaudid for pain. She is receiving reglan, zofran and phenergan and has not been helping. CT A/P today did not reveal any acute intraabdominal pathology.   She had several EGDs so far, showed non-specific gastritis in 2012, 2014 and 2015.   Past Medical History:  Diagnosis Date  . Diabetes mellitus without complication ([place])   . Gastroparesis   . Lupus   . Pseudoseizures   . Seizures ([place])     Past Surgical History:  Procedure Laterality Date  . APPENDECTOMY    . CHOLECYSTECTOMY      Prior to Admission medications   Medication Sig Start Date End Date Taking? Authorizing Provider  Diclofenac Sodium 3 % GEL Place 1 application onto the skin every 12 (twelve) hours as needed. Patient not taking: Reported on 06/09/2016 04/13/16   Orbie Pyo, MD  dicyclomine (BENTYL) 10 MG capsule Take 1 capsule (10 mg total) by mouth 4 (four) times daily as needed for spasms. Patient not taking: Reported on 06/09/2016 04/13/16 04/27/16  Orbie Pyo, MD  feeding supplement (BOOST / RESOURCE BREEZE) LIQD Take 1 Container by mouth 3 (three) times daily between meals. Patient not taking: Reported on 06/09/2016 12/24/15   Theodoro Grist, MD  fluticasone (FLONASE) 50 MCG/ACT nasal spray Place 2 sprays into both nostrils daily. Patient not taking: Reported on 12/22/2015 12/15/15   Jami L Hagler, PA-C  insulin NPH-regular Human (NOVOLIN 70/30) (70-30) 100 UNIT/ML injection Inject 22-25 Units into the skin 2 (two) times daily with a meal. 25 units in the morning and 22 units in the evening Patient not taking: Reported on 06/09/2016 12/18/15   Alexis Hugelmeyer, DO  levETIRAcetam (KEPPRA) 750 MG tablet Take 1 tablet (750 mg total) by mouth 2 (two) times daily. Patient not taking: Reported on 06/09/2016 08/21/15   Paulette Blanch, MD  metoCLOPramide (REGLAN) 5 MG tablet Take 1 tablet (5 mg total) by mouth every 6 (six) hours as needed for nausea. Patient not taking: Reported on 06/09/2016 12/24/15   Theodoro Grist, MD  ondansetron (ZOFRAN ODT) 4 MG disintegrating tablet Allow 1-2 tablets to dissolve in your mouth every 8 hours as needed for nausea/vomiting Patient not taking: Reported on 06/13/2016 06/09/16   Hinda Kehr, MD  ondansetron (ZOFRAN) 4 MG tablet Take 1 tablet (4 mg total) by mouth daily as needed. Patient not taking: Reported on 06/09/2016 04/13/16   Orbie Pyo, MD  pantoprazole (PROTONIX) 40 MG tablet Take 1 tablet (  40 mg total) by mouth 2 (two) times daily. Patient not taking: Reported on 06/09/2016 12/24/15   Theodoro Grist, MD  promethazine (PHENERGAN) 12.5 MG tablet Take 1 tablet (12.5 mg total) by mouth every 6 (six) hours as needed for nausea or vomiting. Patient not taking: Reported on 06/09/2016 12/24/15   Theodoro Grist, MD    Family History  Problem Relation Age of Onset  . Breast cancer Maternal Grandmother      Social History  Substance Use Topics  . Smoking status: Former Smoker    Packs/day: 2.00    Years:  10.00    Types: Cigarettes  . Smokeless tobacco: Never Used  . Alcohol use No    Allergies as of 06/13/2016 - Review Complete 06/13/2016  Allergen Reaction Noted  . Doxycycline Swelling 11/04/2014  . Morphine and related Rash 12/10/2014  . Tylenol [acetaminophen] Rash 11/04/2014    Review of Systems:    All systems reviewed and negative except where noted in HPI.   Physical Exam:  Vital signs in last 24 hours: Temp:  [98 F (36.7 C)-98.5 F (36.9 C)] 98 F (36.7 C) (03/24 1352) Pulse Rate:  [72-91] 72 (03/24 1352) Resp:  [20] 20 (03/24 1352) BP: (129-156)/(71-79) 129/71 (03/24 1352) SpO2:  [97 %-100 %] 97 % (03/24 1352) Last BM Date: 06/14/16 General:   drowsy Head:  Normocephalic and atraumatic. Eyes:   No icterus.   Conjunctiva pink. PERRLA. Ears:  Normal auditory acuity. Neck:  Supple; no masses or thyroidomegaly Lungs: Respirations even and unlabored. Lungs clear to auscultation bilaterally.   No wheezes, crackles, or rhonchi.  Heart:  Regular rate and rhythm;  Without murmur, clicks, rubs or gallops Abdomen:  Soft, nondistended, nontender. Normal bowel sounds. No appreciable masses or hepatomegaly.  No rebound or guarding.  Rectal:  Not performed. Msk:  Symmetrical without gross deformities.  Strength Extremities:  Without edema, cyanosis or clubbing. Neurologic:  Alert and oriented x3;  grossly normal neurologically. Skin:  Intact without significant lesions or rashes. Cervical Nodes:  No significant cervical adenopathy. Psych:  Alert and cooperative. Normal affect.  LAB RESULTS:  Recent Labs  06/13/16 0519 06/14/16 0418 06/15/16 0457  WBC 12.4* 17.8* 16.3*  HGB 13.2 12.7 12.3  HCT 38.5 38.2 36.9  PLT 254 256 257   BMET  Recent Labs  06/13/16 0519 06/14/16 0418  NA 136 137  K 3.5 3.5  CL 101 102  CO2 22 22  GLUCOSE 371* 263*  BUN 7 15  CREATININE 0.61 0.52  CALCIUM 9.7 9.0   LFT  Recent Labs  06/13/16 0519  PROT 7.7  ALBUMIN 4.1  AST  21  ALT 8*  ALKPHOS 43  BILITOT 0.4   PT/INR No results for input(s): LABPROT, INR in the last 72 hours.  STUDIES: Ct Abdomen Pelvis W Contrast  Result Date: 06/15/2016 CLINICAL DATA:  Vomiting.  Diabetes mellitus. EXAM: CT ABDOMEN AND PELVIS WITH CONTRAST TECHNIQUE: Multidetector CT imaging of the abdomen and pelvis was performed using the standard protocol following bolus administration of intravenous contrast. CONTRAST:  137mL ISOVUE-300 IOPAMIDOL (ISOVUE-300) INJECTION 61% COMPARISON:  November 28, 2014 and December 22, 2015 FINDINGS: Lower chest: There is slight right base atelectatic change. Lung bases otherwise clear. There is a small hiatal hernia. Hepatobiliary: There is focal fatty infiltration near the fissure for the ligamentum teres. No focal liver lesions are appreciable. Gallbladder is absent. There is no biliary duct dilatation. Pancreas: There is no pancreatic mass or inflammatory focus. Spleen: No splenic  lesions are evident. Adrenals/Urinary Tract: Right adrenal appears normal. There is a stable left adrenal adenoma measuring 1.2 x 0.8 cm. Kidneys bilaterally show no evidence of mass or hydronephrosis on either side. There is slight perinephric stranding on the left. No perinephric fluid seen. There is no renal or ureteral calculus on either side. Urinary bladder is midline with wall thickness within normal limits. Stomach/Bowel: There is wall thickening in the gastric antral region. No other bowel wall thickening is evident on this study. There is no evident bowel obstruction. No free air or portal venous air. Vascular/Lymphatic: There is atherosclerotic calcification in the aorta and common iliac arteries bilaterally. There is no abdominal aortic aneurysm. Major mesenteric vessels appear patent. No adenopathy is appreciable in the abdomen and pelvis. Reproductive: Uterus is anteverted. There is no pelvic mass or pelvic fluid collection. Other: Appendix absent. No ascites or abscess  in the abdomen or pelvis. Musculoskeletal: There is air in the left anterior abdominal wall, likely due to injection. No abnormal fluid in the abdominal wall. There is no intramuscular lesion. There are no blastic or lytic bone lesions. IMPRESSION: Slight perinephric stranding in the region of the left kidney. This finding potentially could indicate earliest changes of pyelonephritis. Appropriate clinical assessment advised. No hydronephrosis or perinephric fluid on either side. No evidence of mass or abscess. Urinary bladder wall not thickened. No renal or ureteral calculi. Wall thickening in the region of the gastric antrum. Suspect a degree of antral gastritis. No small bowel or large bowel wall thickening. No abscess. No bowel obstruction. Gallbladder absent.  Appendix absent. Aortoiliac atherosclerosis. Air in the left anterior abdominal wall is likely due to injection. No surrounding fluid or abscess. Small left adrenal adenoma, stable. Electronically Signed   By: Lowella Grip III M.D.   On: 06/15/2016 14:03      Impression / Plan:   Rachael Gray is a 36 y.o. y/o female with uncontrolled DM on insulin, complicated by diabetic gastroparesis admitted with acute exacerbation of gastroparesis in the setting of elevated blood sugars.   - Strongly recommend to avoid narcotics which make gastroparesis symptoms worse - Recommend to start low dose nortriptyline for functional abdominal pain 25mg  at bedtime daily - Continue reglan 10mg  4 times daily - Zofran 4mg  SL alternate with phenergan 12.5 or 25mg  3 times daily for break through nausea/vomiting - Low residue diet - Small frequent meals - Tight control of DM - Needs close f/u with GI as out pt  Thank you for involving me in the care of this patient.      LOS: 0 days   Sherri Sear, MD  06/15/2016, 6:35 PM

## 2016-06-15 NOTE — Progress Notes (Signed)
Pt leaning over trashcan vomiting large amount. Water pitcher empty with cup of water empty, 2 empty apple juice containers. Up to BR with assistance.

## 2016-06-15 NOTE — Progress Notes (Signed)
Pt requested juice and water. Recommended to pt to start with ice chips and see if she could tolerate advancement before starting po's again. Ice chips provided. Pt did not agree with this and requested pitcher of water be refilled and apple juice; these were given and encouraged pt to sip and take small amounts due to frequent vomiting with po intake this a.m.

## 2016-06-15 NOTE — Progress Notes (Signed)
Pt observed drinking water; took protonix and approximately 2 minutes later without warning pt vomited 370ml serous bile liquid emesis. Pt advised to hold po intake until stomach can tolerate po's. Returned to restful state quickly with eyes closed. Pt now states she "have not vomited this morning" when asking for extra po's; discouraged pt from taking po's as long as she is vomiting; educated to let her stomach rest and then later try sips/ice chips and advance as tolerated. Advised pt she has vomited this morning; reviewed current meds/times with pt with stated understanding. Pt returned to resting with eyes closed.

## 2016-06-15 NOTE — Progress Notes (Signed)
Pt closed eyes with head dropping backwards on pillow when dilaudid given with pt falling to sleep. On rounds, pt continues to rest quietly with eyes closed with even/easy respirations.

## 2016-06-15 NOTE — Progress Notes (Addendum)
Pt instructed on NPO for CT scan- pt would not answer when she drank last. Water pitcher empty- 150 ml lt beige emesis in emesis bag on overbed table. Speaks with her eyes closed. Requesting pain medication and advised of orders.

## 2016-06-15 NOTE — Progress Notes (Addendum)
Compazine IV given. Pt preparing another cup of water to drink.

## 2016-06-15 NOTE — Progress Notes (Signed)
Pt was doing jerky movement for about five minutes. Dr. Aletha Halim ordered ativan and it was given. Pt had another jerky movement for two minutes. Pt is currently resting. Will continue to monitor.

## 2016-06-15 NOTE — Progress Notes (Signed)
[facility] [place] NAME: Rachael Gray    MR#:  [id number]  DATE OF BIRTH:  [birth year]  SUBJECTIVE:  CHIEF COMPLAINT:   Chief Complaint  Patient presents with  . Seizures   Patient continues to complain of abdominal pain as nausea and vomiting, no diarrhea  Review of Systems  Constitutional: Negative for chills, fever and weight loss.  HENT: Negative for congestion.   Eyes: Negative for blurred vision and double vision.  Respiratory: Negative for cough, sputum production, shortness of breath and wheezing.   Cardiovascular: Negative for chest pain, palpitations, orthopnea, leg swelling and PND.  Gastrointestinal: Positive for abdominal pain, nausea and vomiting. Negative for blood in stool, constipation and diarrhea.  Genitourinary: Negative for dysuria, frequency, hematuria and urgency.  Musculoskeletal: Negative for falls.  Neurological: Negative for dizziness, tremors, focal weakness and headaches.  Endo/Heme/Allergies: Does not bruise/bleed easily.  Psychiatric/Behavioral: Negative for depression. The patient does not have insomnia.     VITAL SIGNS: Blood pressure 136/79, pulse 84, temperature 98.5 F (36.9 C), temperature source Oral, resp. rate 20, height 5\' 5"  (1.651 m), weight 139 lb 11.2 oz (63.4 kg), last menstrual period 06/07/2016, SpO2 100 %.  PHYSICAL EXAMINATION:   GENERAL:  36 y.o.-year-old patient lying in the bed in moderate distress, grimacing, uncomfortable, ringing in the bed claiming she was in severe discomfort .  EYES: Pupils equal, round, reactive to light and accommodation. No scleral icterus. Extraocular muscles intact.  HEENT: Head atraumatic, normocephalic. Oropharynx and nasopharynx clear.  NECK:  Supple, no jugular venous distention. No thyroid enlargement, no tenderness.  LUNGS: Normal breath sounds bilaterally, no wheezing, rales,rhonchi or crepitation. No use of accessory muscles of  respiration.  CARDIOVASCULAR: S1, S2 normal. No murmurs, rubs, or gallops.  ABDOMEN: Soft, milde tenderness nondistended. Bowel sounds present. No organomegaly or mass.  EXTREMITIES: No pedal edema, cyanosis, or clubbing.  NEUROLOGIC: Cranial nerves II through XII are intact. Muscle strength 5/5 in all extremities. Sensation intact. Gait not checked.  PSYCHIATRIC: The patient is alert and oriented x 3.  SKIN: No obvious rash, lesion, or ulcer.   ORDERS/RESULTS REVIEWED:   CBC  Recent Labs Lab 06/09/16 1339 06/13/16 0519 06/14/16 0418 06/15/16 0457  WBC 7.5 12.4* 17.8* 16.3*  HGB 12.4 13.2 12.7 12.3  HCT 36.4 38.5 38.2 36.9  PLT 180 254 256 257  MCV 85.7 85.3 85.1 85.6  MCH 29.1 29.1 28.2 28.4  MCHC 34.0 34.2 33.2 33.2  RDW 13.1 12.8 13.1 13.2  LYMPHSABS 1.9 2.4  --   --   MONOABS 0.4 0.6  --   --   EOSABS 0.2 0.1  --   --   BASOSABS 0.1 0.1  --   --    ------------------------------------------------------------------------------------------------------------------  Chemistries   Recent Labs Lab 06/09/16 1339 06/13/16 0519 06/14/16 0418  NA 134* 136 137  K 3.3* 3.5 3.5  CL 103 101 102  CO2 26 22 22   GLUCOSE 297* 371* 263*  BUN 7 7 15   CREATININE <0.30* 0.61 0.52  CALCIUM 8.7* 9.7 9.0  AST 14* 21  --   ALT 9* 8*  --   ALKPHOS 45 43  --   BILITOT 0.5 0.4  --    ------------------------------------------------------------------------------------------------------------------ estimated creatinine clearance is 89.2 mL/min (by C-G formula based on SCr of 0.52 mg/dL). ------------------------------------------------------------------------------------------------------------------ No results for input(s): TSH, T4TOTAL, T3FREE, THYROIDAB in the last 72 hours.  Invalid input(s): FREET3  Cardiac Enzymes No results for  input(s): CKMB, TROPONINI, MYOGLOBIN in the last 168 hours.  Invalid input(s):  CK ------------------------------------------------------------------------------------------------------------------ Invalid input(s): POCBNP ---------------------------------------------------------------------------------------------------------------  RADIOLOGY: Dg Abd 2 Views  Result Date: 06/13/2016 CLINICAL DATA:  Left-sided abdominal pain for 2 weeks. EXAM: ABDOMEN - 2 VIEW COMPARISON:  CT scan of December 22, 2015. FINDINGS: The bowel gas pattern is normal. There is no evidence of free air. No radio-opaque calculi or other significant radiographic abnormality is seen. IMPRESSION: No evidence of bowel obstruction or ileus. Electronically Signed   By: Marijo Conception, M.D.   On: 06/13/2016 15:19    EKG:  Orders placed or performed during the hospital encounter of 06/09/16  . EKG 12-Lead  . EKG 12-Lead    ASSESSMENT AND PLAN:  Active Problems:   Intractable nausea and vomiting  #1. Intractable nausea and vomiting, And tinnitus to be very symptomatic , I will change Protonix to iv bid, obtain a CT scan of the abdomen , asked GI to see  #2. Hyponatremia, resolved #3. Leukocytosis, persistent , ct of abdomen #4. Diabetes mellitus, continue Sliding scale insulin bg elevated, add low dose lantus #5. Polysubstance abuse, urine drug screen positive for benzodiazepines and cannabinoids, pt recommend to stop Management plans discussed with the patient nt.   DRUG ALLERGIES:  Allergies  Allergen Reactions  . Doxycycline Swelling  . Morphine And Related Rash  . Tylenol [Acetaminophen] Rash    CODE STATUS:     Code Status Orders        Start     Ordered   06/13/16 1446  Full code  Continuous     06/13/16 1445    Code Status History    Date Active Date Inactive Code Status Order ID Comments User Context   12/22/2015  2:09 PM 12/24/2015  6:40 PM Full Code [id number]  Vaughan Basta, MD Inpatient   12/16/2015 10:03 AM 12/18/2015  8:10 PM Full Code [id number]  Baxter Hire, MD Inpatient   12/10/2014  6:01 PM 12/11/2014  2:54 PM Full Code [id number]  Idelle Crouch, MD Inpatient   12/04/2014  8:23 PM 12/07/2014  3:32 PM Full Code [id number]  Demetrios Loll, MD Inpatient      TOTAL TIME TAKING CARE OF THIS PATIENT: 32  minutes.    Dustin Flock M.D on 06/15/2016 at 11:25 AM  Between 7am to 6pm - Pager - [phone]  After 6pm go to [email or web address] - password [email or web address] [facility]  [facility]  Office  [phone]  CC: Primary care physician; No PCP Per Patient

## 2016-06-16 LAB — BASIC METABOLIC PANEL
ANION GAP: 10 (ref 5–15)
BUN: 10 mg/dL (ref 6–20)
CALCIUM: 8.6 mg/dL — AB (ref 8.9–10.3)
CO2: 24 mmol/L (ref 22–32)
Chloride: 101 mmol/L (ref 101–111)
Creatinine, Ser: 0.49 mg/dL (ref 0.44–1.00)
GFR calc non Af Amer: >60 mL/min (ref >60)
Glucose, Bld: 204 mg/dL — ABNORMAL HIGH (ref 65–99)
Potassium: 2.9 mmol/L — ABNORMAL LOW (ref 3.5–5.1)
Sodium: 135 mmol/L (ref 135–145)

## 2016-06-16 LAB — CBC
HEMATOCRIT: 36.4 % (ref 35.0–47.0)
HEMOGLOBIN: 12 g/dL (ref 12.0–16.0)
MCH: 28.5 pg (ref 26.0–34.0)
MCHC: 33.1 g/dL (ref 32.0–36.0)
MCV: 86.3 fL (ref 80.0–100.0)
Platelets: 233 10*3/uL (ref 150–440)
RBC: 4.22 MIL/uL (ref 3.80–5.20)
RDW: 13.1 % (ref 11.5–14.5)
WBC: 10.3 10*3/uL (ref 3.6–11.0)

## 2016-06-16 LAB — GLUCOSE, CAPILLARY
GLUCOSE-CAPILLARY: 331 mg/dL — AB (ref 65–99)
GLUCOSE-CAPILLARY: 126 mg/dL — AB (ref 65–99)
GLUCOSE-CAPILLARY: 183 mg/dL — AB (ref 65–99)
Glucose-Capillary: 202 mg/dL — ABNORMAL HIGH (ref 65–99)

## 2016-06-16 MED ORDER — HYDROMORPHONE HCL 1 MG/ML IJ SOLN
0.5000 mg | Freq: Four times a day (QID) | INTRAMUSCULAR | Status: DC | PRN
Start: 2016-06-16 — End: 2016-06-18
  Administered 2016-06-16 – 2016-06-18 (×9): 0.5 mg via INTRAVENOUS
  Filled 2016-06-16 (×8): qty 0.5

## 2016-06-16 MED ORDER — SODIUM CHLORIDE 0.9 % IV SOLN
30.0000 meq | Freq: Once | INTRAVENOUS | Status: AC
  Administered 2016-06-16: 30 meq via INTRAVENOUS
  Filled 2016-06-16: qty 15

## 2016-06-16 MED ORDER — SODIUM CHLORIDE 0.9 % IV SOLN: 250.0000 mg | Freq: Four times a day (QID) | INTRAVENOUS | Status: DC

## 2016-06-16 MED ORDER — ERYTHROMYCIN ETHYLSUCCINATE 200 MG/5ML PO SUSR
250.0000 mg | Freq: Four times a day (QID) | ORAL | Status: DC
  Administered 2016-06-16: 250 mg via ORAL
  Filled 2016-06-16 (×2): qty 6.3

## 2016-06-16 NOTE — Progress Notes (Signed)
Zofran given x 1 with partial relief.

## 2016-06-16 NOTE — Progress Notes (Signed)
[facility] [place] NAME: Rachael Gray    MR#:  [id number]  DATE OF BIRTH:  [birth year]  SUBJECTIVE:  CHIEF COMPLAINT:   Chief Complaint  Patient presents with  . Seizures   Continues to have significant nausea and vomiting unable to keep anything down  Review of Systems  Constitutional: Negative for chills, fever and weight loss.  HENT: Negative for congestion.   Eyes: Negative for blurred vision and double vision.  Respiratory: Negative for cough, sputum production, shortness of breath and wheezing.   Cardiovascular: Negative for chest pain, palpitations, orthopnea, leg swelling and PND.  Gastrointestinal: Positive for abdominal pain, nausea and vomiting. Negative for blood in stool, constipation and diarrhea.  Genitourinary: Negative for dysuria, frequency, hematuria and urgency.  Musculoskeletal: Negative for falls.  Neurological: Negative for dizziness, tremors, focal weakness and headaches.  Endo/Heme/Allergies: Does not bruise/bleed easily.  Psychiatric/Behavioral: Negative for depression. The patient does not have insomnia.     VITAL SIGNS: Blood pressure 140/71, pulse 86, temperature 97.7 F (36.5 C), temperature source Oral, resp. rate 18, height 5\' 5"  (1.651 m), weight 139 lb 11.2 oz (63.4 kg), last menstrual period 06/07/2016, SpO2 96 %.  PHYSICAL EXAMINATION:   GENERAL:  36 y.o.-year-old patient lying in the bed in moderate distress, grimacing, uncomfortable, ringing in the bed claiming she was in severe discomfort .  EYES: Pupils equal, round, reactive to light and accommodation. No scleral icterus. Extraocular muscles intact.  HEENT: Head atraumatic, normocephalic. Oropharynx and nasopharynx clear.  NECK:  Supple, no jugular venous distention. No thyroid enlargement, no tenderness.  LUNGS: Normal breath sounds bilaterally, no wheezing, rales,rhonchi or crepitation. No use of accessory muscles of  respiration.  CARDIOVASCULAR: S1, S2 normal. No murmurs, rubs, or gallops.  ABDOMEN: Soft, milde tenderness nondistended. Bowel sounds present. No organomegaly or mass.  EXTREMITIES: No pedal edema, cyanosis, or clubbing.  NEUROLOGIC: Cranial nerves II through XII are intact. Muscle strength 5/5 in all extremities. Sensation intact. Gait not checked.  PSYCHIATRIC: The patient is alert and oriented x 3.  SKIN: No obvious rash, lesion, or ulcer.   ORDERS/RESULTS REVIEWED:   CBC  Recent Labs Lab 06/09/16 1339 06/13/16 0519 06/14/16 0418 06/15/16 0457 06/16/16 0352  WBC 7.5 12.4* 17.8* 16.3* 10.3  HGB 12.4 13.2 12.7 12.3 12.0  HCT 36.4 38.5 38.2 36.9 36.4  PLT 180 254 256 257 233  MCV 85.7 85.3 85.1 85.6 86.3  MCH 29.1 29.1 28.2 28.4 28.5  MCHC 34.0 34.2 33.2 33.2 33.1  RDW 13.1 12.8 13.1 13.2 13.1  LYMPHSABS 1.9 2.4  --   --   --   MONOABS 0.4 0.6  --   --   --   EOSABS 0.2 0.1  --   --   --   BASOSABS 0.1 0.1  --   --   --    ------------------------------------------------------------------------------------------------------------------  Chemistries   Recent Labs Lab 06/09/16 1339 06/13/16 0519 06/14/16 0418 06/16/16 0352  NA 134* 136 137 135  K 3.3* 3.5 3.5 2.9*  CL 103 101 102 101  CO2 26 22 22 24   GLUCOSE 297* 371* 263* 204*  BUN 7 7 15 10   CREATININE <0.30* 0.61 0.52 0.49  CALCIUM 8.7* 9.7 9.0 8.6*  AST 14* 21  --   --   ALT 9* 8*  --   --   ALKPHOS 45 43  --   --   BILITOT 0.5 0.4  --   --    ------------------------------------------------------------------------------------------------------------------  estimated creatinine clearance is 89.2 mL/min (by C-G formula based on SCr of 0.49 mg/dL). ------------------------------------------------------------------------------------------------------------------ No results for input(s): TSH, T4TOTAL, T3FREE, THYROIDAB in the last 72 hours.  Invalid input(s): FREET3  Cardiac Enzymes No results for  input(s): CKMB, TROPONINI, MYOGLOBIN in the last 168 hours.  Invalid input(s): CK ------------------------------------------------------------------------------------------------------------------ Invalid input(s): POCBNP ---------------------------------------------------------------------------------------------------------------  RADIOLOGY: Ct Abdomen Pelvis W Contrast  Result Date: 06/15/2016 CLINICAL DATA:  Vomiting.  Diabetes mellitus. EXAM: CT ABDOMEN AND PELVIS WITH CONTRAST TECHNIQUE: Multidetector CT imaging of the abdomen and pelvis was performed using the standard protocol following bolus administration of intravenous contrast. CONTRAST:  11mL ISOVUE-300 IOPAMIDOL (ISOVUE-300) INJECTION 61% COMPARISON:  November 28, 2014 and December 22, 2015 FINDINGS: Lower chest: There is slight right base atelectatic change. Lung bases otherwise clear. There is a small hiatal hernia. Hepatobiliary: There is focal fatty infiltration near the fissure for the ligamentum teres. No focal liver lesions are appreciable. Gallbladder is absent. There is no biliary duct dilatation. Pancreas: There is no pancreatic mass or inflammatory focus. Spleen: No splenic lesions are evident. Adrenals/Urinary Tract: Right adrenal appears normal. There is a stable left adrenal adenoma measuring 1.2 x 0.8 cm. Kidneys bilaterally show no evidence of mass or hydronephrosis on either side. There is slight perinephric stranding on the left. No perinephric fluid seen. There is no renal or ureteral calculus on either side. Urinary bladder is midline with wall thickness within normal limits. Stomach/Bowel: There is wall thickening in the gastric antral region. No other bowel wall thickening is evident on this study. There is no evident bowel obstruction. No free air or portal venous air. Vascular/Lymphatic: There is atherosclerotic calcification in the aorta and common iliac arteries bilaterally. There is no abdominal aortic aneurysm.  Major mesenteric vessels appear patent. No adenopathy is appreciable in the abdomen and pelvis. Reproductive: Uterus is anteverted. There is no pelvic mass or pelvic fluid collection. Other: Appendix absent. No ascites or abscess in the abdomen or pelvis. Musculoskeletal: There is air in the left anterior abdominal wall, likely due to injection. No abnormal fluid in the abdominal wall. There is no intramuscular lesion. There are no blastic or lytic bone lesions. IMPRESSION: Slight perinephric stranding in the region of the left kidney. This finding potentially could indicate earliest changes of pyelonephritis. Appropriate clinical assessment advised. No hydronephrosis or perinephric fluid on either side. No evidence of mass or abscess. Urinary bladder wall not thickened. No renal or ureteral calculi. Wall thickening in the region of the gastric antrum. Suspect a degree of antral gastritis. No small bowel or large bowel wall thickening. No abscess. No bowel obstruction. Gallbladder absent.  Appendix absent. Aortoiliac atherosclerosis. Air in the left anterior abdominal wall is likely due to injection. No surrounding fluid or abscess. Small left adrenal adenoma, stable. Electronically Signed   By: Lowella Grip III M.D.   On: 06/15/2016 14:03    EKG:  Orders placed or performed during the hospital encounter of 06/09/16  . EKG 12-Lead  . EKG 12-Lead    ASSESSMENT AND PLAN:  Active Problems:   Intractable nausea and vomiting  #1. Intractable nausea and vomiting, Suspect related to gastroparesis- CT scan shows findings consistent with gastritis Continue IV Reglan and IV antiemetics, I will decrease the dose on the pain medications I tried to order IV erythromycin was told by pharmacy that this is not available  #2. Hypokalemia I will give her IV KCl #3. Leukocytosis, reactive #4. Diabetes mellitus, continue Sliding scale insulin bg elevated, add low  dose lantus #5. Polysubstance abuse, urine  drug screen positive for benzodiazepines and cannabinoids, pt recommend to stop    DRUG ALLERGIES:  Allergies  Allergen Reactions  . Doxycycline Swelling  . Morphine And Related Rash  . Tylenol [Acetaminophen] Rash    CODE STATUS:     Code Status Orders        Start     Ordered   06/13/16 1446  Full code  Continuous     06/13/16 1445    Code Status History    Date Active Date Inactive Code Status Order ID Comments User Context   12/22/2015  2:09 PM 12/24/2015  6:40 PM Full Code [id number]  Vaughan Basta, MD Inpatient   12/16/2015 10:03 AM 12/18/2015  8:10 PM Full Code [id number]  Baxter Hire, MD Inpatient   12/10/2014  6:01 PM 12/11/2014  2:54 PM Full Code [id number]  Idelle Crouch, MD Inpatient   12/04/2014  8:23 PM 12/07/2014  3:32 PM Full Code [id number]  Demetrios Loll, MD Inpatient      TOTAL TIME TAKING CARE OF THIS PATIENT: 25  minutes.    Dustin Flock M.D on 06/16/2016 at 1:17 PM  Between 7am to 6pm - Pager - [phone]  After 6pm go to [email or web address] - password [email or web address] [facility]  [facility]  Office  [phone]  CC: Primary care physician; No PCP Per Patient

## 2016-06-16 NOTE — Progress Notes (Addendum)
Pt reports she "does not over indulge" when drinking water as pt turning up water pitcher and drinking. Slept at long intervals. Pt continues to have vomiting after intake. Up to BR independently and tolerating well. Dilaudid given per MD order and pt request. Antiemetics not effective with pt not desiring any thus far this shift.

## 2016-06-16 NOTE — Progress Notes (Signed)
Dilaudid given per pt request for generalized abdominal pain. Ask for ice and water in pitcher-provided. Pitcher already full of fresh ice. Pt dozing off during assessment and care; closing eyes with head dropping back to pillow.

## 2016-06-16 NOTE — Consult Note (Signed)
Lucilla Lame, MD [address] [place], [place] [address] Phone: [phone] Fax : [phone]   Subjective: She feels significantly better compared to yesterday. Denies nausea/emesis, tolerating PO well, but still drowsy   Objective: Vital signs in last 24 hours: Vitals:   06/15/16 2039 06/16/16 0338 06/16/16 0710 06/16/16 1251  BP: (!) 144/68 (!) 150/72 (!) 151/72 140/71  Pulse: 75 79 68 86  Resp: 20 18 18 18   Temp: 98.2 F (36.8 C) 97.7 F (36.5 C) 98.5 F (36.9 C) 97.7 F (36.5 C)  TempSrc: Oral Oral Oral Oral  SpO2: 95% 95% 98% 96%  Weight:      Height:       Weight change:   Intake/Output Summary (Last 24 hours) at 06/16/16 1538 Last data filed at 06/16/16 1500  Gross per 24 hour  Intake             4033 ml  Output             2050 ml  Net             1983 ml     Exam: Heart:: Regular rate and rhythm or S1S2 present Lungs: clear to auscultation Abdomen: soft, nontender, normal bowel sounds   Lab Results: @LABTEST2 @ Micro Results: No results found for this or any previous visit (from the past 240 hour(s)). Studies/Results: Ct Abdomen Pelvis W Contrast  Result Date: 06/15/2016 CLINICAL DATA:  Vomiting.  Diabetes mellitus. EXAM: CT ABDOMEN AND PELVIS WITH CONTRAST TECHNIQUE: Multidetector CT imaging of the abdomen and pelvis was performed using the standard protocol following bolus administration of intravenous contrast. CONTRAST:  167mL ISOVUE-300 IOPAMIDOL (ISOVUE-300) INJECTION 61% COMPARISON:  November 28, 2014 and December 22, 2015 FINDINGS: Lower chest: There is slight right base atelectatic change. Lung bases otherwise clear. There is a small hiatal hernia. Hepatobiliary: There is focal fatty infiltration near the fissure for the ligamentum teres. No focal liver lesions are appreciable. Gallbladder is absent. There is no biliary duct dilatation. Pancreas: There is no pancreatic mass or inflammatory focus. Spleen: No splenic lesions are  evident. Adrenals/Urinary Tract: Right adrenal appears normal. There is a stable left adrenal adenoma measuring 1.2 x 0.8 cm. Kidneys bilaterally show no evidence of mass or hydronephrosis on either side. There is slight perinephric stranding on the left. No perinephric fluid seen. There is no renal or ureteral calculus on either side. Urinary bladder is midline with wall thickness within normal limits. Stomach/Bowel: There is wall thickening in the gastric antral region. No other bowel wall thickening is evident on this study. There is no evident bowel obstruction. No free air or portal venous air. Vascular/Lymphatic: There is atherosclerotic calcification in the aorta and common iliac arteries bilaterally. There is no abdominal aortic aneurysm. Major mesenteric vessels appear patent. No adenopathy is appreciable in the abdomen and pelvis. Reproductive: Uterus is anteverted. There is no pelvic mass or pelvic fluid collection. Other: Appendix absent. No ascites or abscess in the abdomen or pelvis. Musculoskeletal: There is air in the left anterior abdominal wall, likely due to injection. No abnormal fluid in the abdominal wall. There is no intramuscular lesion. There are no blastic or lytic bone lesions. IMPRESSION: Slight perinephric stranding in the region of the left kidney. This finding potentially could indicate earliest changes of pyelonephritis. Appropriate clinical assessment advised. No hydronephrosis or perinephric fluid on either side. No evidence of mass or abscess. Urinary bladder wall not thickened. No renal or ureteral calculi. Wall thickening  in the region of the gastric antrum. Suspect a degree of antral gastritis. No small bowel or large bowel wall thickening. No abscess. No bowel obstruction. Gallbladder absent.  Appendix absent. Aortoiliac atherosclerosis. Air in the left anterior abdominal wall is likely due to injection. No surrounding fluid or abscess. Small left adrenal adenoma, stable.  Electronically Signed   By: Lowella Grip III M.D.   On: 06/15/2016 14:03   Medications: I have reviewed the patient's current medications. Scheduled Meds: . enoxaparin (LOVENOX) injection  40 mg Subcutaneous Q24H  . insulin aspart  0-5 Units Subcutaneous QHS  . insulin aspart  0-9 Units Subcutaneous TID WC  . metoCLOPramide (REGLAN) injection  10 mg Intravenous Q6H  . pantoprazole (PROTONIX) IV  40 mg Intravenous Q12H  . potassium chloride (KCL MULTIRUN) 30 mEq in 265 mL IVPB  30 mEq Intravenous Once  . sucralfate  1 g Oral Q6H   Continuous Infusions: . 0.9 % NaCl with KCl 20 mEq / L 75 mL/hr at 06/16/16 1329   PRN Meds:.acetaminophen **OR** acetaminophen, diphenhydrAMINE, HYDROmorphone (DILAUDID) injection, ondansetron **OR** ondansetron (ZOFRAN) IV, prochlorperazine   Assessment: Active Problems:   Intractable nausea and vomiting    Plan: Adalae L Dimare is a 36 y.o. y/o female with uncontrolled DM on insulin, complicated by diabetic gastroparesis admitted with acute exacerbation of gastroparesis in the setting of elevated blood sugars.   - Strongly recommend to avoid narcotics which make gastroparesis symptoms worse - Recommend to start low dose nortriptyline for functional abdominal pain 25mg  at bedtime daily - Continue reglan 10mg  4 times daily - Zofran 4mg  SL alternate with phenergan 12.5 or 25mg  3 times daily for break through nausea/vomiting - Low residue diet - Small frequent meals - Tight control of DM - Needs close f/u with GI as out pt - No indication for EGD at this time    LOS: 1 day   Rohini Vanga 06/16/2016, 3:38 PM

## 2016-06-17 LAB — BASIC METABOLIC PANEL
ANION GAP: 9 (ref 5–15)
BUN: <5 mg/dL — ABNORMAL LOW (ref 6–20)
CHLORIDE: 103 mmol/L (ref 101–111)
CO2: 24 mmol/L (ref 22–32)
Calcium: 8.5 mg/dL — ABNORMAL LOW (ref 8.9–10.3)
Creatinine, Ser: 0.5 mg/dL (ref 0.44–1.00)
GFR calc non Af Amer: >60 mL/min (ref >60)
Glucose, Bld: 239 mg/dL — ABNORMAL HIGH (ref 65–99)
POTASSIUM: 3.5 mmol/L (ref 3.5–5.1)
Sodium: 136 mmol/L (ref 135–145)

## 2016-06-17 LAB — GLUCOSE, CAPILLARY
GLUCOSE-CAPILLARY: 240 mg/dL — AB (ref 65–99)
GLUCOSE-CAPILLARY: 295 mg/dL — AB (ref 65–99)
Glucose-Capillary: 231 mg/dL — ABNORMAL HIGH (ref 65–99)
Glucose-Capillary: 177 mg/dL — ABNORMAL HIGH (ref 65–99)

## 2016-06-17 MED ORDER — CLINDAMYCIN HCL 150 MG PO CAPS
300.0000 mg | ORAL_CAPSULE | Freq: Four times a day (QID) | ORAL | Status: DC
  Administered 2016-06-17 – 2016-06-18 (×5): 300 mg via ORAL
  Filled 2016-06-17 (×4): qty 2
  Filled 2016-06-17: qty 1

## 2016-06-17 MED ORDER — SUCRALFATE 1 GM/10ML PO SUSP
1.0000 g | Freq: Four times a day (QID) | ORAL | Status: DC
  Administered 2016-06-17 – 2016-06-18 (×5): 1 g via ORAL
  Filled 2016-06-17 (×5): qty 10

## 2016-06-17 MED ORDER — BACITRACIN-NEOMYCIN-POLYMYXIN 400-5-5000 EX OINT
TOPICAL_OINTMENT | Freq: Two times a day (BID) | CUTANEOUS | Status: DC
  Administered 2016-06-17 – 2016-06-18 (×3): 1 via TOPICAL
  Filled 2016-06-17 (×3): qty 1

## 2016-06-17 MED ORDER — NORTRIPTYLINE HCL 25 MG PO CAPS
25.0000 mg | ORAL_CAPSULE | Freq: Every day | ORAL | Status: DC
  Administered 2016-06-17: 25 mg via ORAL
  Filled 2016-06-17 (×2): qty 1

## 2016-06-17 NOTE — Progress Notes (Signed)
[name] NAME: Rachael Gray    MR#:  [id number]  DATE OF BIRTH:  02/02/1981  SUBJECTIVE:  CHIEF COMPLAINT:   Chief Complaint  Patient presents with  . Seizures  Patient feeling much better her nausea is improved not throwing up vomiting is resolved She states that she has  swelling in the back of the neck  Review of Systems  Constitutional: Negative for chills, fever and weight loss.  HENT: Negative for congestion.   Eyes: Negative for blurred vision and double vision.  Respiratory: Negative for cough, sputum production, shortness of breath and wheezing.   Cardiovascular: Negative for chest pain, palpitations, orthopnea, leg swelling and PND.  Gastrointestinal: Negative for abdominal pain, blood in stool, constipation, diarrhea, nausea and vomiting.  Genitourinary: Negative for dysuria, frequency, hematuria and urgency.  Musculoskeletal: Negative for falls.  Neurological: Negative for dizziness, tremors, focal weakness and headaches.  Endo/Heme/Allergies: Does not bruise/bleed easily.  Psychiatric/Behavioral: Negative for depression. The patient does not have insomnia.     VITAL SIGNS: Blood pressure 136/83, pulse 92, temperature 98.8 F (37.1 C), temperature source Oral, resp. rate 18, height 5\' 5"  (1.651 m), weight 139 lb 11.2 oz (63.4 kg), last menstrual period 06/07/2016, SpO2 98 %.  PHYSICAL EXAMINATION:   GENERAL:  36 y.o.-year-old patient lying in the bed in moderate distress, grimacing, uncomfortable, ringing in the bed claiming she was in severe discomfort .  EYES: Pupils equal, round, reactive to light and accommodation. No scleral icterus. Extraocular muscles intact.  HEENT: Head atraumatic, normocephalic. Oropharynx and nasopharynx clear.  NECK:  Supple, no jugular venous distention. No thyroid enlargement, no tenderness.  LUNGS: Normal breath sounds bilaterally, no wheezing, rales,rhonchi or  crepitation. No use of accessory muscles of respiration.  CARDIOVASCULAR: S1, S2 normal. No murmurs, rubs, or gallops.  ABDOMEN: Soft, milde tenderness nondistended. Bowel sounds present. No organomegaly or mass.  EXTREMITIES: No pedal edema, cyanosis, or clubbing.  NEUROLOGIC: Cranial nerves II through XII are intact. Muscle strength 5/5 in all extremities. Sensation intact. Gait not checked.  PSYCHIATRIC: The patient is alert and oriented x 3.  SKIN: Has a raised area in the back of neck with some warmth  ORDERS/RESULTS REVIEWED:   CBC  Recent Labs Lab 06/13/16 0519 06/14/16 0418 06/15/16 0457 06/16/16 0352  WBC 12.4* 17.8* 16.3* 10.3  HGB 13.2 12.7 12.3 12.0  HCT 38.5 38.2 36.9 36.4  PLT 254 256 257 233  MCV 85.3 85.1 85.6 86.3  MCH 29.1 28.2 28.4 28.5  MCHC 34.2 33.2 33.2 33.1  RDW 12.8 13.1 13.2 13.1  LYMPHSABS 2.4  --   --   --   MONOABS 0.6  --   --   --   EOSABS 0.1  --   --   --   BASOSABS 0.1  --   --   --    ------------------------------------------------------------------------------------------------------------------  Chemistries   Recent Labs Lab 06/13/16 0519 06/14/16 0418 06/16/16 0352 06/17/16 0428  NA 136 137 135 136  K 3.5 3.5 2.9* 3.5  CL 101 102 101 103  CO2 22 22 24 24   GLUCOSE 371* 263* 204* 239*  BUN 7 15 10  <5*  CREATININE 0.61 0.52 0.49 0.50  CALCIUM 9.7 9.0 8.6* 8.5*  AST 21  --   --   --   ALT 8*  --   --   --   ALKPHOS 43  --   --   --  BILITOT 0.4  --   --   --    ------------------------------------------------------------------------------------------------------------------ estimated creatinine clearance is 89.2 mL/min (by C-G formula based on SCr of 0.5 mg/dL). ------------------------------------------------------------------------------------------------------------------ No results for input(s): TSH, T4TOTAL, T3FREE, THYROIDAB in the last 72 hours.  Invalid input(s): FREET3  Cardiac Enzymes No results for input(s):  CKMB, TROPONINI, MYOGLOBIN in the last 168 hours.  Invalid input(s): CK ------------------------------------------------------------------------------------------------------------------ Invalid input(s): POCBNP ---------------------------------------------------------------------------------------------------------------  RADIOLOGY: Ct Abdomen Pelvis W Contrast  Result Date: 06/15/2016 CLINICAL DATA:  Vomiting.  Diabetes mellitus. EXAM: CT ABDOMEN AND PELVIS WITH CONTRAST TECHNIQUE: Multidetector CT imaging of the abdomen and pelvis was performed using the standard protocol following bolus administration of intravenous contrast. CONTRAST:  16mL ISOVUE-300 IOPAMIDOL (ISOVUE-300) INJECTION 61% COMPARISON:  November 28, 2014 and December 22, 2015 FINDINGS: Lower chest: There is slight right base atelectatic change. Lung bases otherwise clear. There is a small hiatal hernia. Hepatobiliary: There is focal fatty infiltration near the fissure for the ligamentum teres. No focal liver lesions are appreciable. Gallbladder is absent. There is no biliary duct dilatation. Pancreas: There is no pancreatic mass or inflammatory focus. Spleen: No splenic lesions are evident. Adrenals/Urinary Tract: Right adrenal appears normal. There is a stable left adrenal adenoma measuring 1.2 x 0.8 cm. Kidneys bilaterally show no evidence of mass or hydronephrosis on either side. There is slight perinephric stranding on the left. No perinephric fluid seen. There is no renal or ureteral calculus on either side. Urinary bladder is midline with wall thickness within normal limits. Stomach/Bowel: There is wall thickening in the gastric antral region. No other bowel wall thickening is evident on this study. There is no evident bowel obstruction. No free air or portal venous air. Vascular/Lymphatic: There is atherosclerotic calcification in the aorta and common iliac arteries bilaterally. There is no abdominal aortic aneurysm. Major  mesenteric vessels appear patent. No adenopathy is appreciable in the abdomen and pelvis. Reproductive: Uterus is anteverted. There is no pelvic mass or pelvic fluid collection. Other: Appendix absent. No ascites or abscess in the abdomen or pelvis. Musculoskeletal: There is air in the left anterior abdominal wall, likely due to injection. No abnormal fluid in the abdominal wall. There is no intramuscular lesion. There are no blastic or lytic bone lesions. IMPRESSION: Slight perinephric stranding in the region of the left kidney. This finding potentially could indicate earliest changes of pyelonephritis. Appropriate clinical assessment advised. No hydronephrosis or perinephric fluid on either side. No evidence of mass or abscess. Urinary bladder wall not thickened. No renal or ureteral calculi. Wall thickening in the region of the gastric antrum. Suspect a degree of antral gastritis. No small bowel or large bowel wall thickening. No abscess. No bowel obstruction. Gallbladder absent.  Appendix absent. Aortoiliac atherosclerosis. Air in the left anterior abdominal wall is likely due to injection. No surrounding fluid or abscess. Small left adrenal adenoma, stable. Electronically Signed   By: Lowella Grip III M.D.   On: 06/15/2016 14:03    EKG:  Orders placed or performed during the hospital encounter of 06/09/16  . EKG 12-Lead  . EKG 12-Lead    ASSESSMENT AND PLAN:  Active Problems:   Intractable nausea and vomiting  #1. Intractable nausea and vomiting, Suspect related to gastroparesis- CT scan shows findings consistent with gastritis Symptoms have improved. I will advance her diet.   #2. Hypokalemia I will give her IV KCl #3. Leukocytosis, reactive #4. Diabetes mellitus, continue Sliding scale insulin bg elevated, add low dose lantus #5. Polysubstance abuse, urine drug  screen positive for benzodiazepines and cannabinoids, pt recommend to stop #6.  cellulits in back on neck- po  clindamycin  Discharge tomm   DRUG ALLERGIES:  Allergies  Allergen Reactions  . Doxycycline Swelling  . Morphine And Related Rash  . Tylenol [Acetaminophen] Rash    CODE STATUS:     Code Status Orders        Start     Ordered   06/13/16 1446  Full code  Continuous     06/13/16 1445    Code Status History    Date Active Date Inactive Code Status Order ID Comments User Context   12/22/2015  2:09 PM 12/24/2015  6:40 PM Full Code [id number]  Vaughan Basta, MD Inpatient   12/16/2015 10:03 AM 12/18/2015  8:10 PM Full Code [id number]  Baxter Hire, MD Inpatient   12/10/2014  6:01 PM 12/11/2014  2:54 PM Full Code [id number]  Idelle Crouch, MD Inpatient   12/04/2014  8:23 PM 12/07/2014  3:32 PM Full Code [id number]  Demetrios Loll, MD Inpatient      TOTAL TIME TAKING CARE OF THIS PATIENT: 25  minutes.    Dustin Flock M.D on 06/17/2016 at 12:57 PM  Between 7am to 6pm - Pager - [phone]  After 6pm go to [email or web address] - password [email or web address] [facility]  [facility]  Office  [phone]  CC: Primary care physician; No PCP Per Patient

## 2016-06-17 NOTE — Care Management (Signed)
Admitted to this facility with the diagnosis of nausea/vomiting. Lives with her mother Neoma Laming [phone]). And 2 children (ages 24 & 67). No primary care physician and pharmacy. Discussed that she was given [facility] and Medication information 12/2015. "Got too busy and didn't call." Discussed that Lorrie at [facility] would be notified when she is discharged from the hospital this time. States that she was suppose to start a new jo at [facility] in [place] last Friday, but she was here. Takes care of all basic activities of daily living herself, doesn't drive. Grandmother helps with errands.  Possible discharge tomorrow per Dr. Posey Pronto. Sister, Ernst Breach, will transport. Shelbie Ammons RN MSN CCM Care Management

## 2016-06-17 NOTE — Progress Notes (Signed)
Inpatient Diabetes Program Recommendations  AACE/ADA: New Consensus Statement on Inpatient Glycemic Control (2015)  Target Ranges:  Prepandial:   less than 140 mg/dL      Peak postprandial:   less than 180 mg/dL (1-2 hours)      Critically ill patients:  140 - 180 mg/dL   Results for Rachael Gray, Rachael Gray (MRN [id number]) as of 06/17/2016 11:16  Ref. Range 06/16/2016 08:03 06/16/2016 12:05 06/16/2016 17:01 06/16/2016 20:35 06/17/2016 07:45  Glucose-Capillary Latest Ref Range: 65 - 99 mg/dL 331 (H) 126 (H) 183 (H) 202 (H) 240 (H)   Review of Glycemic Control  Diabetes history: DM 2 Outpatient Diabetes medications: was on 70/30 insulin Current orders for Inpatient glycemic control: Novolog 0-9 units tid + Novolog 0-5 units qhs  Inpatient Diabetes Program Recommendations:   Please consider obtaining an A1c level to assess glucose control over the past 2-3 months. Fasting glucose elevated over the past 3 days 240-330's. Please consider low dose basal insulin while inpatient, Lantus 6 units (0.1unit/kg) Q 24 hours.  Thanks,  Tama Headings RN, MSN, [facility] Inpatient Diabetes Coordinator Team Pager [phone] (8a-5p)

## 2016-06-18 LAB — GLUCOSE, CAPILLARY
GLUCOSE-CAPILLARY: 302 mg/dL — AB (ref 65–99)
Glucose-Capillary: 140 mg/dL — ABNORMAL HIGH (ref 65–99)

## 2016-06-18 MED ORDER — PANTOPRAZOLE SODIUM 40 MG PO TBEC: 40.0000 mg | DELAYED_RELEASE_TABLET | Freq: Every day | ORAL | 0 refills | Status: DC

## 2016-06-18 MED ORDER — CLINDAMYCIN HCL 300 MG PO CAPS: 300.0000 mg | ORAL_CAPSULE | Freq: Four times a day (QID) | ORAL | 0 refills | Status: AC

## 2016-06-18 MED ORDER — DOCUSATE SODIUM 100 MG PO CAPS
200.0000 mg | ORAL_CAPSULE | Freq: Once | ORAL | Status: AC
  Administered 2016-06-18: 200 mg via ORAL
  Filled 2016-06-18: qty 2

## 2016-06-18 MED ORDER — METOCLOPRAMIDE HCL 5 MG PO TABS: 5.0000 mg | ORAL_TABLET | Freq: Three times a day (TID) | ORAL | 2 refills | Status: DC

## 2016-06-18 MED ORDER — NORTRIPTYLINE HCL 25 MG PO CAPS: 25.0000 mg | ORAL_CAPSULE | Freq: Every day | ORAL | 0 refills | Status: DC

## 2016-06-18 MED ORDER — BACITRACIN-NEOMYCIN-POLYMYXIN 400-5-5000 EX OINT: TOPICAL_OINTMENT | Freq: Two times a day (BID) | CUTANEOUS | 0 refills | Status: DC

## 2016-06-18 MED ORDER — POLYETHYLENE GLYCOL 3350 17 G PO PACK
17.0000 g | PACK | Freq: Once | ORAL | Status: AC
  Administered 2016-06-18: 17 g via ORAL
  Filled 2016-06-18: qty 1

## 2016-06-18 MED ORDER — PANTOPRAZOLE SODIUM 40 MG PO TBEC: 40.0000 mg | DELAYED_RELEASE_TABLET | Freq: Every day | ORAL | Status: DC

## 2016-06-18 NOTE — Progress Notes (Signed)
Discussed discharge instructions and medications with pt. IV removed. All questions addressed. Pt transported home via car by her aunt.  Sherlyn Lick

## 2016-06-18 NOTE — Care Management (Signed)
Discharge to home today per Dr. Posey Pronto. Lurena Nida at [facility] updated. Family will transport to home this afternoon. Shelbie Ammons RN MSN CCM Care Management

## 2016-06-18 NOTE — Discharge Summary (Signed)
[place] [place], [place] y.o., DOB [birth year], MRN [id number]. 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 female with a known history of Diabetes type 2 without complication, history of diabetic gastroparesis, lupus, pseudoseizures, medical noncompliance who presents to the hospital due to intractable nausea and vomiting. Patient continued to be very symptomatic. She was treated with antiemetics and IV Reglan and IV PPIs. She was slow to improve. She was seen in consultation by GI and had a CT of the abdomen. With that show some gastritis. Patient symptoms have improved since. And is doing better. She'll need outpatient follow-up with her primary care provider and GI as needed. She was also recommended to start nortriptyline at bedtime to help with her gastroparesis per GI.             Consults  GI  Significant Tests:  See full reports for all details     Ct Abdomen Pelvis W Contrast  Result Date: 06/15/2016 CLINICAL DATA:  Vomiting.  Diabetes mellitus. EXAM: CT ABDOMEN AND PELVIS WITH CONTRAST TECHNIQUE: Multidetector CT imaging of the abdomen and pelvis was performed using the standard protocol following bolus administration of intravenous contrast. CONTRAST:  186mL ISOVUE-300 IOPAMIDOL (ISOVUE-300) INJECTION 61% COMPARISON:  November 28, 2014 and December 22, 2015  FINDINGS: Lower chest: There is slight right base atelectatic change. Lung bases otherwise clear. There is a small hiatal hernia. Hepatobiliary: There is focal fatty infiltration near the fissure for the ligamentum teres. No focal liver lesions are appreciable. Gallbladder is absent. There is no biliary duct dilatation. Pancreas: There is no pancreatic mass or inflammatory focus. Spleen: No splenic lesions are evident. Adrenals/Urinary Tract: Right adrenal appears normal. There is a stable left adrenal adenoma measuring 1.2 x 0.8 cm. Kidneys bilaterally show no evidence of mass or hydronephrosis on either side. There is slight perinephric stranding on the left. No perinephric fluid seen. There is no renal or ureteral calculus on either side. Urinary bladder is midline with wall thickness within normal limits. Stomach/Bowel: There is wall thickening in the gastric antral region. No other bowel wall thickening is evident on this study. There is no evident bowel obstruction. No free air or portal venous air. Vascular/Lymphatic: There is atherosclerotic calcification in the aorta and common iliac arteries bilaterally. There is no abdominal aortic aneurysm. Major mesenteric vessels appear patent. No adenopathy is appreciable in the abdomen and pelvis. Reproductive: Uterus is anteverted. There is no pelvic mass or pelvic fluid collection. Other: Appendix absent. No ascites or abscess in the abdomen or pelvis. Musculoskeletal: There is air in the left anterior abdominal wall, likely due to injection. No abnormal fluid in the abdominal wall. There is no intramuscular lesion. There are no blastic or lytic bone lesions. IMPRESSION: Slight perinephric stranding in the region of the left kidney. This finding potentially could indicate earliest changes of pyelonephritis. Appropriate clinical assessment advised. No hydronephrosis or perinephric fluid on  either side. No evidence of mass or abscess. Urinary bladder wall not  thickened. No renal or ureteral calculi. Wall thickening in the region of the gastric antrum. Suspect a degree of antral gastritis. No small bowel or large bowel wall thickening. No abscess. No bowel obstruction. Gallbladder absent.  Appendix absent. Aortoiliac atherosclerosis. Air in the left anterior abdominal wall is likely due to injection. No surrounding fluid or abscess. Small left adrenal adenoma, stable. Electronically Signed   By: Lowella Grip III M.D.   On: 06/15/2016 14:03   Dg Abd 2 Views  Result Date: 06/13/2016 CLINICAL DATA:  Left-sided abdominal pain for 2 weeks. EXAM: ABDOMEN - 2 VIEW COMPARISON:  CT scan of December 22, 2015. FINDINGS: The bowel gas pattern is normal. There is no evidence of free air. No radio-opaque calculi or other significant radiographic abnormality is seen. IMPRESSION: No evidence of bowel obstruction or ileus. Electronically Signed   By: Marijo Conception, M.D.   On: 06/13/2016 15:19       Today   Subjective:   Rachael Gray  patient feeling better resolved  Objective:   Blood pressure 123/68, pulse 76, temperature 98.3 F (36.8 C), resp. rate 20, height 5\' 5"  (1.651 m), weight 139 lb 11.2 oz (63.4 kg), last menstrual period 06/07/2016, SpO2 100 %.  .  Intake/Output Summary (Last 24 hours) at 06/18/16 1211 Last data filed at 06/17/16 1800  Gross per 24 hour  Intake              480 ml  Output                0 ml  Net              480 ml    Exam VITAL SIGNS: Blood pressure 123/68, pulse 76, temperature 98.3 F (36.8 C), resp. rate 20, height 5\' 5"  (1.651 m), weight 139 lb 11.2 oz (63.4 kg), last menstrual period 06/07/2016, SpO2 100 %.  GENERAL:  36 y.o.-year-old patient lying in the bed with no acute distress.  EYES: Pupils equal, round, reactive to light and accommodation. No scleral icterus. Extraocular muscles intact.  HEENT: Head atraumatic, normocephalic. Oropharynx and nasopharynx clear.  NECK:  Supple, no jugular venous  distention. No thyroid enlargement, no tenderness.  LUNGS: Normal breath sounds bilaterally, no wheezing, rales,rhonchi or crepitation. No use of accessory muscles of respiration.  CARDIOVASCULAR: S1, S2 normal. No murmurs, rubs, or gallops.  ABDOMEN: Soft, nontender, nondistended. Bowel sounds present. No organomegaly or mass.  EXTREMITIES: No pedal edema, cyanosis, or clubbing.  NEUROLOGIC: Cranial nerves II through XII are intact. Muscle strength 5/5 in all extremities. Sensation intact. Gait not checked.  PSYCHIATRIC: The patient is alert and oriented x 3.  SKIN: No obvious rash, lesion, or ulcer.   Data Review     CBC w Diff: Lab Results  Component Value Date   WBC 10.3 06/16/2016   HGB 12.0 06/16/2016   HGB 12.2 06/18/2014   HCT 36.4 06/16/2016   HCT 37.3 06/18/2014   PLT 233 06/16/2016   PLT 170 06/18/2014   LYMPHOPCT 20 06/13/2016   LYMPHOPCT 45.3 06/18/2014   MONOPCT 5 06/13/2016   MONOPCT 9.1 06/18/2014   EOSPCT 1 06/13/2016   EOSPCT 1.5 06/18/2014   BASOPCT 1 06/13/2016   BASOPCT 1.2 06/18/2014   CMP: Lab Results  Component Value Date   NA 136 06/17/2016   NA 138 06/18/2014   K 3.5 06/17/2016   K 3.4 (L) 06/18/2014  CL 103 06/17/2016   CL 106 06/18/2014   CO2 24 06/17/2016   CO2 25 06/18/2014   BUN <5 (L) 06/17/2016   BUN 6 06/18/2014   CREATININE 0.50 06/17/2016   CREATININE 0.42 (L) 06/18/2014   PROT 7.7 06/13/2016   PROT 7.1 06/16/2014   ALBUMIN 4.1 06/13/2016   ALBUMIN 4.0 06/16/2014   BILITOT 0.4 06/13/2016   BILITOT 0.8 06/16/2014   ALKPHOS 43 06/13/2016   ALKPHOS 38 06/16/2014   AST 21 06/13/2016   AST 14 (L) 06/16/2014   ALT 8 (L) 06/13/2016   ALT 10 (L) 06/16/2014  .  Micro Results No results found for this or any previous visit (from the past 240 hour(s)).      Code Status Orders        Start     Ordered   06/13/16 1446  Full code  Continuous     06/13/16 1445    Code Status History    Date Active Date Inactive Code  Status Order ID Comments User Context   12/22/2015  2:09 PM 12/24/2015  6:40 PM Full Code [id number]  Vaughan Basta, MD Inpatient   12/16/2015 10:03 AM 12/18/2015  8:10 PM Full Code [id number]  Baxter Hire, MD Inpatient   12/10/2014  6:01 PM 12/11/2014  2:54 PM Full Code [id number]  Idelle Crouch, MD Inpatient   12/04/2014  8:23 PM 12/07/2014  3:32 PM Full Code [id number]  Demetrios Loll, MD Inpatient          Follow-up Information    Call the toll-free number listed later in this paperwork [phone] they can help set you up with a primary care doctor near you Follow up.        Schedule an appointment as soon as possible for a visit with [facility].   Why:  for a follow up appointment, As needed Contact information: [place] [id number] [phone]        Lucilla Lame, MD. Go on 06/27/2016.   Specialty:  Gastroenterology Why:  @2 :45 PM Contact information: [address] [place]  [place] [address] [phone]           Discharge Medications   Allergies as of 06/18/2016      Reactions   Doxycycline Swelling   Morphine And Related Rash   Tylenol [acetaminophen] Rash      Medication List    STOP taking these medications   dicyclomine 10 MG capsule Commonly known as:  BENTYL     TAKE these medications   clindamycin 300 MG capsule Commonly known as:  CLEOCIN Take 1 capsule (300 mg total) by mouth every 6 (six) hours.   Diclofenac Sodium 3 % Gel Place 1 application onto the skin every 12 (twelve) hours as needed.   feeding supplement Liqd Take 1 Container by mouth 3 (three) times daily between meals.   fluticasone 50 MCG/ACT nasal spray Commonly known as:  FLONASE Place 2 sprays into both nostrils daily.   insulin NPH-regular Human (70-30) 100 UNIT/ML injection Commonly known as:  NOVOLIN 70/30 Inject 22-25 Units into the skin 2 (two) times daily with a meal. 25 units in the morning and 22 units in  the evening   levETIRAcetam 750 MG tablet Commonly known as:  KEPPRA Take 1 tablet (750 mg total) by mouth 2 (two) times daily.   metoCLOPramide 5 MG tablet Commonly known as:  REGLAN Take 1 tablet (5 mg total) by mouth 4 (four) times  daily -  before meals and at bedtime. What changed:  when to take this  reasons to take this   neomycin-bacitracin-polymyxin ointment Commonly known as:  NEOSPORIN Apply topically 2 (two) times daily. apply to back of neck   nortriptyline 25 MG capsule Commonly known as:  PAMELOR Take 1 capsule (25 mg total) by mouth at bedtime.   ondansetron 4 MG disintegrating tablet Commonly known as:  ZOFRAN ODT Allow 1-2 tablets to dissolve in your mouth every 8 hours as needed for nausea/vomiting   ondansetron 4 MG tablet Commonly known as:  ZOFRAN Take 1 tablet (4 mg total) by mouth daily as needed.   pantoprazole 40 MG tablet Commonly known as:  PROTONIX Take 1 tablet (40 mg total) by mouth daily. What changed:  when to take this   promethazine 12.5 MG tablet Commonly known as:  PHENERGAN Take 1 tablet (12.5 mg total) by mouth every 6 (six) hours as needed for nausea or vomiting.          Total Time in preparing paper work, data evaluation and todays exam - 35 minutes  Dustin Flock M.D on 06/18/2016 at 12:11 PM  [facility]   Office  [phone]

## 2016-06-18 NOTE — Progress Notes (Signed)
Inpatient Diabetes Program Recommendations  AACE/ADA: New Consensus Statement on Inpatient Glycemic Control (2015)  Target Ranges:  Prepandial:   less than 140 mg/dL      Peak postprandial:   less than 180 mg/dL (1-2 hours)      Critically ill patients:  140 - 180 mg/dL  Results for Rachael Gray, Rachael Gray (MRN [id number]) as of 06/18/2016 10:10  Ref. Range 06/17/2016 07:45 06/17/2016 11:44 06/17/2016 17:13 06/17/2016 20:37 06/18/2016 07:30  Glucose-Capillary Latest Ref Range: 65 - 99 mg/dL 240 (H) 231 (H) 177 (H) 295 (H) 302 (H)    Review of Glycemic Control   Outpatient Diabetes medications: 70/30 25 units QAM, 70/30 22 units QPM Current orders for Inpatient glycemic control: Novolog 0-9 units TID with meals, Novolog 0-5 units QHS  Inpatient Diabetes Program Recommendations: Insulin - Basal: Fasting glucose 302 mg/dl this morning. Please consider ordering low dose basal insulin. Recommend starting with Lantus 6 units Q24H starting now. Insulin - Meal Coverage: Please consider ordering Novolog 3 units TID with meals for meal coverage if patient eats at least 50% of meals.  Thanks, Barnie Alderman, RN, MSN, CDE Diabetes Coordinator Inpatient Diabetes Program [phone] (Team Pager from 8am to 5pm)

## 2016-06-18 NOTE — Discharge Instructions (Signed)
[facility] -  at [facility] ° °DIET:  °Diabetic diet ° °DISCHARGE CONDITION:  °Stable ° °ACTIVITY:  °Activity as tolerated ° °OXYGEN:  °Home Oxygen: No. °  °Oxygen Delivery: room air ° °DISCHARGE LOCATION:  °home  ° ° °ADDITIONAL DISCHARGE INSTRUCTION: ° ° °If you experience worsening of your admission symptoms, develop shortness of breath, life threatening emergency, suicidal or homicidal thoughts you must seek medical attention immediately by calling 911 or calling your MD immediately  if symptoms less severe. ° °You Must read complete instructions/literature along with all the possible adverse reactions/side effects for all the Medicines you take and that have been prescribed to you. Take any new Medicines after you have completely understood and accpet all the possible adverse reactions/side effects.  ° °Please note ° °You were cared for by a hospitalist during your hospital stay. If you have any questions about your discharge medications or the care you received while you were in the hospital after you are discharged, you can call the unit and asked to speak with the hospitalist on call if the hospitalist that took care of you is not available. Once you are discharged, your primary care physician will handle any further medical issues. Please note that NO REFILLS for any discharge medications will be authorized once you are discharged, as it is imperative that you return to your primary care physician (or establish a relationship with a primary care physician if you do not have one) for your aftercare needs so that they can reassess your need for medications and monitor your lab values. ° ° °

## 2016-06-27 ENCOUNTER — Ambulatory Visit: Payer: Self-pay | Admitting: Gastroenterology

## 2016-07-23 ENCOUNTER — Ambulatory Visit: Payer: Self-pay | Admitting: Gastroenterology

## 2016-07-23 ENCOUNTER — Encounter: Payer: Self-pay | Admitting: Gastroenterology

## 2016-07-23 NOTE — Progress Notes (Deleted)
Primary Care Physician: No PCP Per Patient  Primary Gastroenterologist:  Dr. Lucilla Lame  No chief complaint on file.   HPI: Rachael Gray is a 36 y.o. female here for history of gastroparesis. The patient has been in the hospital back in 2015 and seen by me for her abdominal pain with nausea and vomiting. The patient has not followed up since July 2015. The patient has long standing diabetes. The patient was in the ER in March and had a CT scan that showed inflammation in the gastric antrum. This was reported to be consistent with possible gastritis. There is also a suggestion of possible early pyelonephritis.  Current Outpatient Prescriptions  Medication Sig Dispense Refill  . Diclofenac Sodium 3 % GEL Place 1 application onto the skin every 12 (twelve) hours as needed. (Patient not taking: Reported on 06/09/2016) 50 g 0  . feeding supplement (BOOST / RESOURCE BREEZE) LIQD Take 1 Container by mouth 3 (three) times daily between meals. (Patient not taking: Reported on 06/09/2016) 90 Container 0  . fluticasone (FLONASE) 50 MCG/ACT nasal spray Place 2 sprays into both nostrils daily. (Patient not taking: Reported on 12/22/2015) 16 g 0  . insulin NPH-regular Human (NOVOLIN 70/30) (70-30) 100 UNIT/ML injection Inject 22-25 Units into the skin 2 (two) times daily with a meal. 25 units in the morning and 22 units in the evening (Patient not taking: Reported on 06/09/2016) 10 mL 2  . levETIRAcetam (KEPPRA) 750 MG tablet Take 1 tablet (750 mg total) by mouth 2 (two) times daily. (Patient not taking: Reported on 06/09/2016) 60 tablet 0  . metoCLOPramide (REGLAN) 5 MG tablet Take 1 tablet (5 mg total) by mouth 4 (four) times daily -  before meals and at bedtime. 60 tablet 2  . neomycin-bacitracin-polymyxin (NEOSPORIN) ointment Apply topically 2 (two) times daily. apply to back of neck 15 g 0  . nortriptyline (PAMELOR) 25 MG capsule Take 1 capsule (25 mg total) by mouth at bedtime. 30 capsule 0  .  ondansetron (ZOFRAN ODT) 4 MG disintegrating tablet Allow 1-2 tablets to dissolve in your mouth every 8 hours as needed for nausea/vomiting (Patient not taking: Reported on 06/13/2016) 30 tablet 0  . ondansetron (ZOFRAN) 4 MG tablet Take 1 tablet (4 mg total) by mouth daily as needed. (Patient not taking: Reported on 06/09/2016) 10 tablet 0  . pantoprazole (PROTONIX) 40 MG tablet Take 1 tablet (40 mg total) by mouth daily. 30 tablet 0  . promethazine (PHENERGAN) 12.5 MG tablet Take 1 tablet (12.5 mg total) by mouth every 6 (six) hours as needed for nausea or vomiting. (Patient not taking: Reported on 06/09/2016) 60 tablet 6   No current facility-administered medications for this visit.     Allergies as of 07/23/2016 - Review Complete 06/15/2016  Allergen Reaction Noted  . Doxycycline Swelling 11/04/2014  . Morphine and related Rash 12/10/2014  . Tylenol [acetaminophen] Rash 11/04/2014    ROS:  General: Negative for anorexia, weight loss, fever, chills, fatigue, weakness. ENT: Negative for hoarseness, difficulty swallowing , nasal congestion. CV: Negative for chest pain, angina, palpitations, dyspnea on exertion, peripheral edema.  Respiratory: Negative for dyspnea at rest, dyspnea on exertion, cough, sputum, wheezing.  GI: See history of present illness. GU:  Negative for dysuria, hematuria, urinary incontinence, urinary frequency, nocturnal urination.  Endo: Negative for unusual weight change.    Physical Examination:   There were no vitals taken for this visit.  General: Well-nourished, well-developed in no acute distress.  Eyes:  No icterus. Conjunctivae pink. Mouth: Oropharyngeal mucosa moist and pink , no lesions erythema or exudate. Lungs: Clear to auscultation bilaterally. Non-labored. Heart: Regular rate and rhythm, no murmurs rubs or gallops.  Abdomen: Bowel sounds are normal, nontender, nondistended, no hepatosplenomegaly or masses, no abdominal bruits or hernia , no rebound  or guarding.   Extremities: No lower extremity edema. No clubbing or deformities. Neuro: Alert and oriented x 3.  Grossly intact. Skin: Warm and dry, no jaundice.   Psych: Alert and cooperative, normal mood and affect.  Labs:    Imaging Studies: No results found.  Assessment and Plan:   Rachael Gray is a 36 y.o. y/o female with a history of gastroparesis with nausea and vomiting and abdominal pain.    Lucilla Lame, MD. [name]   Note: This dictation was prepared with Dragon dictation along with smaller phrase technology. Any transcriptional errors that result from this process are unintentional.

## 2016-09-20 ENCOUNTER — Encounter: Payer: Self-pay | Admitting: Emergency Medicine

## 2016-09-20 ENCOUNTER — Emergency Department
Admission: EM | Admit: 2016-09-20 | Discharge: 2016-09-21 | Disposition: A | Discharge status: Home / Self Care | Payer: Self-pay | Attending: Emergency Medicine | Admitting: Emergency Medicine

## 2016-09-20 DIAGNOSIS — R569 Unspecified convulsions: Secondary | ICD-10-CM | POA: Insufficient documentation

## 2016-09-20 DIAGNOSIS — E119 Type 2 diabetes mellitus without complications: Secondary | ICD-10-CM | POA: Insufficient documentation

## 2016-09-20 DIAGNOSIS — Z87891 Personal history of nicotine dependence: Secondary | ICD-10-CM | POA: Insufficient documentation

## 2016-09-20 DIAGNOSIS — Z79899 Other long term (current) drug therapy: Secondary | ICD-10-CM | POA: Insufficient documentation

## 2016-09-20 LAB — BASIC METABOLIC PANEL
ANION GAP: 10 (ref 5–15)
BUN: 10 mg/dL (ref 6–20)
CO2: 24 mmol/L (ref 22–32)
Calcium: 9.6 mg/dL (ref 8.9–10.3)
Chloride: 101 mmol/L (ref 101–111)
Creatinine, Ser: 0.43 mg/dL — ABNORMAL LOW (ref 0.44–1.00)
GFR calc Af Amer: >60 mL/min (ref >60)
Glucose, Bld: 305 mg/dL — ABNORMAL HIGH (ref 65–99)
POTASSIUM: 3.7 mmol/L (ref 3.5–5.1)
SODIUM: 135 mmol/L (ref 135–145)

## 2016-09-20 LAB — CBC
HCT: 41.5 % (ref 35.0–47.0)
Hemoglobin: 13.9 g/dL (ref 12.0–16.0)
MCH: 28.6 pg (ref 26.0–34.0)
MCHC: 33.6 g/dL (ref 32.0–36.0)
MCV: 85.2 fL (ref 80.0–100.0)
PLATELETS: 248 10*3/uL (ref 150–440)
RBC: 4.87 MIL/uL (ref 3.80–5.20)
RDW: 13.3 % (ref 11.5–14.5)
WBC: 10.2 10*3/uL (ref 3.6–11.0)

## 2016-09-20 LAB — GLUCOSE, CAPILLARY: GLUCOSE-CAPILLARY: 279 mg/dL — AB (ref 65–99)

## 2016-09-20 MED ORDER — SODIUM CHLORIDE 0.9 % IV SOLN
1000.0000 mg | INTRAVENOUS | Status: AC
  Administered 2016-09-20: 1000 mg via INTRAVENOUS
  Filled 2016-09-20: qty 10

## 2016-09-20 NOTE — ED Triage Notes (Signed)
Pt comes into the ED via EMS from home c/o seizure that lasted 3 minutes in time.  Witnessed seizures with full body rigidity and eye deviation to the upper right side.  No voiding or biting of the tongue noted by EMS.  4 versed given by EMS due to patient seizing with them.  Patient has h/o of not taking seizure medication. Patient postictal at this time.

## 2016-09-20 NOTE — ED Provider Notes (Signed)
[facility] Emergency Department Provider Note  ____________________________________________   First MD Initiated Contact with Patient 09/20/16 2253     (approximate)  I have reviewed the triage vital signs and the nursing notes.   HISTORY  Chief Complaint Seizures    HPI Rachael Gray is a 36 y.o. 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  She presents by EMS tonight for evaluation of a reported seizure that lasted 3 minutes.  EMS reports that the family who is with her reports that her body was rigid and her eyes were deviating to the right side.  She did not lose control of her bowels or bladder and has no trauma to her tongue or anywhere else.  She was given 4 mg of Versed by EMS because of the seizure-like activity that they were witnessing.  At the time of my evaluation, approximately 30 minutes after her arrival to the emergency department, she is sleepy but awake and alert and able to answer all my questions.  She reports that she has been taking her Reglan and her pantoprazole and that her stomach problems have improved since the last time I saw her which was about 3 months ago.  She has not been vomiting recently and has a mild level of baseline abdominal pain.  She states, however, that she has not been taking her Keppra and is not sure when she ran out but she states that she does not tend to get it filled soon.  She denies fever/chills, chest pain, shortness of breath, dysuria.  Nothing in particular makes the patient's symptoms better nor worse.     Past Medical History:  Diagnosis Date  . Diabetes mellitus without complication ([place])   . Gastroparesis   . Lupus   . Pseudoseizures   . Seizures [facility])     Patient Active Problem List   Diagnosis  Date Noted  . Hypokalemia 12/24/2015  . Anemia 12/24/2015  . Diabetes ([place]) 12/24/2015  . Gastroparesis due to DM ([place])   . Acute respiratory alkalosis 12/22/2015  . Duodenitis 12/22/2015  . Vomiting 12/16/2015  . Intractable nausea and vomiting 12/10/2014  . Nausea & vomiting 12/10/2014  . Narcotic abuse 12/09/2014  . Sepsis ([place]) 12/07/2014  . Anxiety   . Pyelonephritis 12/04/2014  . Lactic acidosis 12/04/2014    Class: Acute    Past Surgical History:  Procedure Laterality Date  . APPENDECTOMY    . CHOLECYSTECTOMY      Prior to Admission medications   Medication Sig Start Date End Date Taking? Authorizing Provider  Diclofenac Sodium 3 % GEL Place 1 application onto the skin every 12 (twelve) hours as needed. Patient not taking: Reported on 06/09/2016 04/13/16   Orbie Pyo, MD  feeding supplement (BOOST / RESOURCE BREEZE) LIQD Take 1 Container by mouth 3 (three) times daily between meals. Patient not taking: Reported on 06/09/2016 12/24/15   Theodoro Grist, MD  fluticasone [facility]) 50 MCG/ACT nasal spray Place 2 sprays into both nostrils daily. Patient not taking: Reported on 12/22/2015 12/15/15   Hagler, Jami L, PA-C  insulin NPH-regular Human (NOVOLIN 70/30) (70-30) 100 UNIT/ML injection Inject 22-25 Units into the skin 2 (two) times daily with a meal. 25 units in the morning and 22 units in the evening Patient not taking: Reported on 06/09/2016 12/18/15   Hugelmeyer, Ubaldo Glassing, DO  levETIRAcetam (KEPPRA) 750  MG tablet Take 1 tablet (750 mg total) by mouth 2 (two) times daily. Patient not taking: Reported on 06/09/2016 08/21/15   Paulette Blanch, MD  metoCLOPramide (REGLAN) 5 MG tablet Take 1 tablet (5 mg total) by mouth 4 (four) times daily -  before meals and at bedtime. 06/18/16   Dustin Flock, MD  neomycin-bacitracin-polymyxin (NEOSPORIN) ointment Apply topically 2 (two) times daily. apply to back of neck 06/18/16   Dustin Flock, MD  nortriptyline (PAMELOR) 25 MG  capsule Take 1 capsule (25 mg total) by mouth at bedtime. 06/18/16   Dustin Flock, MD  ondansetron (ZOFRAN ODT) 4 MG disintegrating tablet Allow 1-2 tablets to dissolve in your mouth every 8 hours as needed for nausea/vomiting Patient not taking: Reported on 06/13/2016 06/09/16   Hinda Kehr, MD  ondansetron (ZOFRAN) 4 MG tablet Take 1 tablet (4 mg total) by mouth daily as needed. Patient not taking: Reported on 06/09/2016 04/13/16   Orbie Pyo, MD  pantoprazole (PROTONIX) 40 MG tablet Take 1 tablet (40 mg total) by mouth daily. 06/18/16   Dustin Flock, MD  promethazine (PHENERGAN) 12.5 MG tablet Take 1 tablet (12.5 mg total) by mouth every 6 (six) hours as needed for nausea or vomiting. Patient not taking: Reported on 06/09/2016 12/24/15   Theodoro Grist, MD    Allergies Doxycycline; Morphine and related; and Tylenol [acetaminophen]  Family History  Problem Relation Age of Onset  . Breast cancer Maternal Grandmother     Social History Social History  Substance Use Topics  . Smoking status: Former Smoker    Packs/day: 2.00    Years: 10.00    Types: Cigarettes  . Smokeless tobacco: Never Used  . Alcohol use No    Review of Systems Constitutional: No fever/chills Eyes: No visual changes. ENT: No sore throat. Cardiovascular: Denies chest pain. Respiratory: Denies shortness of breath. Gastrointestinal: No abdominal pain.  No nausea, no vomiting.  No diarrhea.  No constipation. Genitourinary: Negative for dysuria. Musculoskeletal: Negative for neck pain.  Negative for back pain. Integumentary: Negative for rash. Neurological: Reportedly patient had a generalized three-minute seizure at home, now seems to be at baseline although somnolent after foresaid 4 mg.  No focal numbness nor weakness.   ____________________________________________   PHYSICAL EXAM:  VITAL SIGNS: ED Triage Vitals  Enc Vitals Group     BP 09/20/16 2245 129/84     Pulse Rate 09/20/16 2245  (!) 101     Resp 09/20/16 2245 12     Temp 09/20/16 2247 98.1 F (36.7 C)     Temp Source 09/20/16 2247 Oral     SpO2 09/20/16 2245 100 %     Weight 09/20/16 2246 62.3 kg (137 lb 6.4 oz)     Height 09/20/16 2246 1.575 m (5\' 2" )     Head Circumference --      Peak Flow --      Pain Score --      Pain Loc --      Pain Edu? --      Excl. in [place]? --     Constitutional: Alert and oriented Though somnolent, postictal versus post-Versed. Well appearing and in no acute distress. Eyes: Conjunctivae are normal. PERRL. EOMI. Head: Atraumatic. Nose: No congestion/rhinnorhea. Mouth/Throat: Mucous membranes are moist.  No evidence of trauma to her tongue Neck: No stridor.  No meningeal signs.   Cardiovascular: Normal rate, regular rhythm. Good peripheral circulation. Grossly normal heart sounds. Respiratory: Normal respiratory effort.  No retractions. Lungs CTAB.  Gastrointestinal: Soft with mild diffuse tenderness throughout her abdomen but no focal tenderness, no rebound, no guarding, no distention Musculoskeletal: No lower extremity tenderness nor edema. No gross deformities of extremities. Neurologic:  Normal speech and language. No gross focal neurologic deficits are appreciated.  Skin:  Skin is warm, dry and intact. No rash noted. Psychiatric: Mood and affect are flat and blunted but at her baseline.  ____________________________________________   LABS (all labs ordered are listed, but only abnormal results are displayed)  Labs Reviewed  BASIC METABOLIC PANEL - Abnormal; Notable for the following:       Result Value   Glucose, Bld 305 (*)    Creatinine, Ser 0.43 (*)    All other components within normal limits  GLUCOSE, CAPILLARY - Abnormal; Notable for the following:    Glucose-Capillary 279 (*)    All other components within normal limits  CBC  CBG MONITORING, ED   ____________________________________________  EKG  ED ECG REPORT I, Shadaya Marschner, the attending physician,  personally viewed and interpreted this ECG.  Date: 09/20/2016 EKG Time: 22:41 Rate: 104 Rhythm: Mild sinus tachycardia QRS Axis: normal Intervals: normal ST/T Wave abnormalities: normal Narrative Interpretation: unremarkable  ____________________________________________  RADIOLOGY   No results found.  ____________________________________________   PROCEDURES  Critical Care performed: No   Procedure(s) performed:   Procedures   ____________________________________________   INITIAL IMPRESSION / ASSESSMENT AND PLAN / ED COURSE  Pertinent labs & imaging results that were available during my care of the patient were reviewed by me and considered in my medical decision making (see chart for details).  The patient is well-known to the emergency department for similar complaints in the past.  She has a reassuring exam at this time.  Her initial very mild tachycardia has resolved and she is currently resting comfortably.  We discussed medication compliance issues, the fact that her abdominal issues have improved significantly on her current medication regimen, the role that her continued marijuana use (as per her own report) may play with her constellation of symptoms, etc.  I will give her a dose of Keppra 1 g IV and continue to monitor her for a couple of hours to make sure she does not have any additional symptoms and then likely discharge her home for outpatient follow-up.  I explained this to her and she agrees with the plan.  There is no indication for any imaging at this time including CT scan of the head and she has no evidence of any traumatic injuries as a result of her seizure-like activity.   Clinical Course as of Sep 22 334  Sat Sep 21, 2016  9030 The patient has been resting comfortably for 5 hours in the emergency department and is had no additional seizure activity.  She states she feels much better and is ready to go home.  No indication for further workup.  I  gave my usual and customary return precautions.     [CF]    Clinical Course User Index [CF] Hinda Kehr, MD    ____________________________________________  FINAL CLINICAL IMPRESSION(S) / ED DIAGNOSES  Final diagnoses:  Seizure-like activity ([place])     MEDICATIONS GIVEN DURING THIS VISIT:  Medications  levETIRAcetam (KEPPRA) 1,000 mg in sodium chloride 0.9 % 100 mL IVPB (0 mg Intravenous Stopped 09/20/16 2344)     NEW OUTPATIENT MEDICATIONS STARTED DURING THIS VISIT:  New Prescriptions   No medications on file    Modified Medications   No medications on file  Discontinued Medications   No medications on file     Note:  This document was prepared using Dragon voice recognition software and may include unintentional dictation errors.    Hinda Kehr, MD 09/21/16 [phone]

## 2016-09-21 NOTE — ED Notes (Signed)
msg for pt given "call Darnelle Maffucci [phone]"

## 2016-09-21 NOTE — Discharge Instructions (Signed)
You have been seen in the emergency department today for a seizure.  Your workup today including labs are within normal limits.  Please follow up with your doctor/neurologist as soon as possible regarding today's emergency department visit and your likely seizure.  As we have discussed it is very important that you do not drive until you have been seen and cleared by your neurologist.  Take your regular anti-seizure medications.  Please drink plenty of fluids, get plenty of sleep and avoid any alcohol or drug use Please return to the emergency department if you have any further seizures which do not respond to medications, or for any other symptoms per se concerning for yourself.

## 2016-10-17 ENCOUNTER — Emergency Department
Admission: EM | Admit: 2016-10-17 | Discharge: 2016-10-17 | Disposition: A | Discharge status: Home / Self Care | Payer: Medicaid Other | Attending: Emergency Medicine | Admitting: Emergency Medicine

## 2016-10-17 DIAGNOSIS — R569 Unspecified convulsions: Secondary | ICD-10-CM | POA: Insufficient documentation

## 2016-10-17 DIAGNOSIS — E119 Type 2 diabetes mellitus without complications: Secondary | ICD-10-CM | POA: Diagnosis not present

## 2016-10-17 DIAGNOSIS — Z87891 Personal history of nicotine dependence: Secondary | ICD-10-CM | POA: Diagnosis not present

## 2016-10-17 LAB — COMPREHENSIVE METABOLIC PANEL
ALT: 9 U/L — AB (ref 14–54)
AST: 17 U/L (ref 15–41)
Albumin: 4.3 g/dL (ref 3.5–5.0)
Alkaline Phosphatase: 47 U/L (ref 38–126)
Anion gap: 9 (ref 5–15)
BUN: 11 mg/dL (ref 6–20)
CALCIUM: 9.9 mg/dL (ref 8.9–10.3)
CO2: 26 mmol/L (ref 22–32)
CREATININE: 0.58 mg/dL (ref 0.44–1.00)
Chloride: 104 mmol/L (ref 101–111)
GFR calc non Af Amer: >60 mL/min (ref >60)
Glucose, Bld: 272 mg/dL — ABNORMAL HIGH (ref 65–99)
Potassium: 3.7 mmol/L (ref 3.5–5.1)
SODIUM: 139 mmol/L (ref 135–145)
Total Bilirubin: 0.4 mg/dL (ref 0.3–1.2)
Total Protein: 8.1 g/dL (ref 6.5–8.1)

## 2016-10-17 LAB — CBC
HCT: 40.2 % (ref 35.0–47.0)
HEMOGLOBIN: 13.6 g/dL (ref 12.0–16.0)
MCH: 29.1 pg (ref 26.0–34.0)
MCHC: 33.7 g/dL (ref 32.0–36.0)
MCV: 86.1 fL (ref 80.0–100.0)
Platelets: 214 10*3/uL (ref 150–440)
RBC: 4.67 MIL/uL (ref 3.80–5.20)
RDW: 13.2 % (ref 11.5–14.5)
WBC: 7.1 10*3/uL (ref 3.6–11.0)

## 2016-10-17 LAB — GLUCOSE, CAPILLARY: Glucose-Capillary: 260 mg/dL — ABNORMAL HIGH (ref 65–99)

## 2016-10-17 MED ORDER — SODIUM CHLORIDE 0.9 % IV BOLUS (SEPSIS)
1000.0000 mL | Freq: Once | INTRAVENOUS | Status: AC
  Administered 2016-10-17: 1000 mL via INTRAVENOUS

## 2016-10-17 MED ORDER — LORAZEPAM 2 MG/ML IJ SOLN
1.0000 mg | Freq: Once | INTRAMUSCULAR | Status: AC
  Filled 2016-10-17: qty 1

## 2016-10-17 MED ORDER — SODIUM CHLORIDE 0.9 % IV SOLN
1000.0000 mg | Freq: Once | INTRAVENOUS | Status: AC
  Administered 2016-10-17: 1000 mg via INTRAVENOUS
  Filled 2016-10-17: qty 10

## 2016-10-17 NOTE — ED Provider Notes (Signed)
[facility] Emergency Department Provider Note    First MD Initiated Contact with Patient 10/17/16 0200     (approximate)  I have reviewed the triage vital signs and the nursing notes.   HISTORY  Chief Complaint Seizures   HPI Rachael Gray is a 36 y.o. female with below listed chronic medical conditions including pseudoseizures/seizure disorder presents emergency Department with 3 witness seizure-like activity by her family tonight. Per the patient's significant other at bedside patient "woke up shaking at  midnight tonight".   Past Medical History:  Diagnosis Date  . Diabetes mellitus without complication ([place])   . Gastroparesis   . Lupus   . Pseudoseizures   . Seizures [facility])     Patient Active Problem List   Diagnosis Date Noted  . Hypokalemia 12/24/2015  . Anemia 12/24/2015  . Diabetes ([place]) 12/24/2015  . Gastroparesis due to DM ([place])   . Acute respiratory alkalosis 12/22/2015  . Duodenitis 12/22/2015  . Vomiting 12/16/2015  . Intractable nausea and vomiting 12/10/2014  . Nausea & vomiting 12/10/2014  . Narcotic abuse 12/09/2014  . Sepsis ([place]) 12/07/2014  . Anxiety   . Pyelonephritis 12/04/2014  . Lactic acidosis 12/04/2014    Class: Acute    Past Surgical History:  Procedure Laterality Date  . APPENDECTOMY    . CHOLECYSTECTOMY      Prior to Admission medications   Medication Sig Start Date End Date Taking? Authorizing Provider  Diclofenac Sodium 3 % GEL Place 1 application onto the skin every 12 (twelve) hours as needed. Patient not taking: Reported on 06/09/2016 04/13/16   Orbie Pyo, MD  feeding supplement (BOOST / RESOURCE BREEZE) LIQD Take 1 Container by mouth 3 (three) times daily between meals. Patient not taking: Reported on 06/09/2016 12/24/15   Theodoro Grist, MD  fluticasone [facility]) 50 MCG/ACT nasal spray Place 2 sprays into both nostrils daily. Patient not taking: Reported on 12/22/2015 12/15/15    Hagler, Jami L, PA-C  insulin NPH-regular Human (NOVOLIN 70/30) (70-30) 100 UNIT/ML injection Inject 22-25 Units into the skin 2 (two) times daily with a meal. 25 units in the morning and 22 units in the evening Patient not taking: Reported on 06/09/2016 12/18/15   Hugelmeyer, Ubaldo Glassing, DO  levETIRAcetam (KEPPRA) 750 MG tablet Take 1 tablet (750 mg total) by mouth 2 (two) times daily. Patient not taking: Reported on 06/09/2016 08/21/15   Paulette Blanch, MD  metoCLOPramide (REGLAN) 5 MG tablet Take 1 tablet (5 mg total) by mouth 4 (four) times daily -  before meals and at bedtime. Patient not taking: Reported on 10/17/2016 06/18/16   Dustin Flock, MD  neomycin-bacitracin-polymyxin (NEOSPORIN) ointment Apply topically 2 (two) times daily. apply to back of neck Patient not taking: Reported on 10/17/2016 06/18/16   Dustin Flock, MD  nortriptyline (PAMELOR) 25 MG capsule Take 1 capsule (25 mg total) by mouth at bedtime. Patient not taking: Reported on 10/17/2016 06/18/16   Dustin Flock, MD  ondansetron (ZOFRAN ODT) 4 MG disintegrating tablet Allow 1-2 tablets to dissolve in your mouth every 8 hours as needed for nausea/vomiting Patient not taking: Reported on 06/13/2016 06/09/16   Hinda Kehr, MD  ondansetron (ZOFRAN) 4 MG tablet Take 1 tablet (4 mg total) by mouth daily as needed. Patient not taking: Reported on 06/09/2016 04/13/16   Orbie Pyo, MD  pantoprazole (PROTONIX) 40 MG tablet Take 1 tablet (40 mg total) by mouth daily. Patient not taking: Reported on 10/17/2016 06/18/16   Dustin Flock,  MD  promethazine (PHENERGAN) 12.5 MG tablet Take 1 tablet (12.5 mg total) by mouth every 6 (six) hours as needed for nausea or vomiting. Patient not taking: Reported on 06/09/2016 12/24/15   Theodoro Grist, MD    Allergies Doxycycline; Morphine and related; and Tylenol [acetaminophen]  Family History  Problem Relation Age of Onset  . Breast cancer Maternal Grandmother     Social History Social  History  Substance Use Topics  . Smoking status: Former Smoker    Packs/day: 2.00    Years: 10.00    Types: Cigarettes  . Smokeless tobacco: Never Used  . Alcohol use No    Review of Systems Constitutional: No fever/chills Eyes: No visual changes. ENT: No sore throat. Cardiovascular: Denies chest pain. Respiratory: Denies shortness of breath. Gastrointestinal: No abdominal pain.  No nausea, no vomiting.  No diarrhea.  No constipation. Genitourinary: Negative for dysuria. Musculoskeletal: Negative for neck pain.  Negative for back pain. Integumentary: Negative for rash. Neurological: Negative for headaches, focal weakness or numbness.Positive for seizures  ____________________________________________   PHYSICAL EXAM:  VITAL SIGNS: ED Triage Vitals  Enc Vitals Group     BP 10/17/16 0230 132/75     Pulse Rate 10/17/16 0213 91     Resp 10/17/16 0213 (!) 21     Temp 10/17/16 0213 97.8 F (36.6 C)     Temp Source 10/17/16 0213 Oral     SpO2 10/17/16 0213 100 %     Weight 10/17/16 0216 62.6 kg (138 lb)     Height 10/17/16 0216 1.626 m (5\' 4" )     Head Circumference --      Peak Flow --      Pain Score 10/17/16 0212 8     Pain Loc --      Pain Edu? --      Excl. in [place]? --     Constitutional: Alert and oriented. Well appearing and in no acute distress. Eyes: Conjunctivae are normal. PERRL. EOMI. Head: Atraumatic. Mouth/Throat: Mucous membranes are moist.  Oropharynx non-erythematous. Neck: No stridor.  No meningeal signs.  Cardiovascular: Normal rate, regular rhythm. Good peripheral circulation. Grossly normal heart sounds. Respiratory: Normal respiratory effort.  No retractions. Lungs CTAB. Gastrointestinal: Soft and nontender. No distention.  Musculoskeletal: No lower extremity tenderness nor edema. No gross deformities of extremities. Neurologic:  Normal speech and language. No gross focal neurologic deficits are appreciated.  Skin:  Skin is warm, dry and intact. No  rash noted. Psychiatric: Mood and affect are normal. Speech and behavior are normal.  ____________________________________________   LABS (all labs ordered are listed, but only abnormal results are displayed)  Labs Reviewed  COMPREHENSIVE METABOLIC PANEL - Abnormal; Notable for the following:       Result Value   Glucose, Bld 272 (*)    ALT 9 (*)    All other components within normal limits  GLUCOSE, CAPILLARY - Abnormal; Notable for the following:    Glucose-Capillary 260 (*)    All other components within normal limits  CBC     Procedures   ____________________________________________   INITIAL IMPRESSION / ASSESSMENT AND PLAN / ED COURSE  Pertinent labs & imaging results that were available during my care of the patient were reviewed by me and considered in my medical decision making (see chart for details).  36 year old female presents to the emergency department status post 3 witnessed seizure-like activity. Patient given Keppra 1 g in the emergency department IV.      ____________________________________________  FINAL  CLINICAL IMPRESSION(S) / ED DIAGNOSES  Final diagnoses:  Seizure ([place])     MEDICATIONS GIVEN DURING THIS VISIT:  Medications  LORazepam (ATIVAN) injection 1 mg (0 mg Intravenous Return to [facility] 10/17/16 0235)  sodium chloride 0.9 % bolus 1,000 mL (1,000 mLs Intravenous New Bag/Given 10/17/16 0228)  sodium chloride 0.9 % bolus 1,000 mL (1,000 mLs Intravenous New Bag/Given 10/17/16 0228)  levETIRAcetam (KEPPRA) 1,000 mg in sodium chloride 0.9 % 100 mL IVPB (0 mg Intravenous Stopped 10/17/16 0335)     NEW OUTPATIENT MEDICATIONS STARTED DURING THIS VISIT:  New Prescriptions   No medications on file    Modified Medications   No medications on file    Discontinued Medications   No medications on file     Note:  This document was prepared using Dragon voice recognition software and may include unintentional dictation errors.      Gregor Hams, MD 10/17/16 [phone]

## 2016-10-17 NOTE — ED Notes (Signed)

## 2016-10-17 NOTE — ED Triage Notes (Signed)
Pt arrived POV. Significant other stated that she had two seizures back to back at home. Pt has Hx of seizures. Pt stated having a HA since around 2 or 3pm on Wed. She went to sleep around 5 or 6pm and her kids woke her up around midnight because she was shaking. Not sure if she hit her head on anything

## 2016-11-07 ENCOUNTER — Emergency Department
Admission: EM | Admit: 2016-11-07 | Discharge: 2016-11-07 | Discharge status: Against Medical Advice | Payer: Medicaid Other | Attending: Emergency Medicine | Admitting: Emergency Medicine

## 2016-11-07 ENCOUNTER — Encounter: Payer: Self-pay | Admitting: Emergency Medicine

## 2016-11-07 DIAGNOSIS — Z87891 Personal history of nicotine dependence: Secondary | ICD-10-CM | POA: Diagnosis not present

## 2016-11-07 DIAGNOSIS — E1143 Type 2 diabetes mellitus with diabetic autonomic (poly)neuropathy: Secondary | ICD-10-CM | POA: Insufficient documentation

## 2016-11-07 DIAGNOSIS — R569 Unspecified convulsions: Secondary | ICD-10-CM | POA: Diagnosis present

## 2016-11-07 DIAGNOSIS — F445 Conversion disorder with seizures or convulsions: Secondary | ICD-10-CM | POA: Insufficient documentation

## 2016-11-07 DIAGNOSIS — R1110 Vomiting, unspecified: Secondary | ICD-10-CM | POA: Diagnosis not present

## 2016-11-07 LAB — CBC WITH DIFFERENTIAL/PLATELET
Basophils Absolute: 0.1 10*3/uL (ref 0–0.1)
Basophils Relative: 1 %
EOS PCT: 1 %
Eosinophils Absolute: 0 10*3/uL (ref 0–0.7)
HCT: 45.4 % (ref 35.0–47.0)
Hemoglobin: 15.1 g/dL (ref 12.0–16.0)
LYMPHS ABS: 3.8 10*3/uL — AB (ref 1.0–3.6)
LYMPHS PCT: 39 %
MCH: 28.3 pg (ref 26.0–34.0)
MCHC: 33.2 g/dL (ref 32.0–36.0)
MCV: 85.2 fL (ref 80.0–100.0)
MONO ABS: 0.6 10*3/uL (ref 0.2–0.9)
Monocytes Relative: 6 %
Neutro Abs: 5.4 10*3/uL (ref 1.4–6.5)
Neutrophils Relative %: 53 %
PLATELETS: 254 10*3/uL (ref 150–440)
RBC: 5.32 MIL/uL — AB (ref 3.80–5.20)
RDW: 13.2 % (ref 11.5–14.5)
WBC: 9.9 10*3/uL (ref 3.6–11.0)

## 2016-11-07 LAB — GLUCOSE, CAPILLARY: Glucose-Capillary: 267 mg/dL — ABNORMAL HIGH (ref 65–99)

## 2016-11-07 MED ORDER — IBUPROFEN 600 MG PO TABS
ORAL_TABLET | ORAL | Status: AC
  Administered 2016-11-07: 600 mg via ORAL
  Filled 2016-11-07: qty 1

## 2016-11-07 MED ORDER — IBUPROFEN 600 MG PO TABS
600.0000 mg | ORAL_TABLET | Freq: Once | ORAL | Status: AC
  Administered 2016-11-07: 600 mg via ORAL

## 2016-11-07 MED ORDER — ACETAMINOPHEN 325 MG PO TABS
650.0000 mg | ORAL_TABLET | Freq: Once | ORAL | Status: DC
  Filled 2016-11-07: qty 2

## 2016-11-07 MED ORDER — SODIUM CHLORIDE 0.9 % IV SOLN
1000.0000 mg | Freq: Once | INTRAVENOUS | Status: DC
  Filled 2016-11-07: qty 10

## 2016-11-07 NOTE — ED Notes (Addendum)
Patient calling out states she is "going to discharge herself". Pt refuses to sign AMA , states she does not want to sign anything. MD made aware. Pt has no existing lines or access

## 2016-11-07 NOTE — ED Notes (Signed)
POC CBG: 267

## 2016-11-07 NOTE — ED Notes (Signed)
Pt called out for RN to come in. This RN answered call light. Per pt, she is unable to take Motrin due to allergy, at time Motrin disposed of in toilet. Pt yelling at this RN to help her with her HA. Pt demanding Alieve. MD made aware.

## 2016-11-07 NOTE — ED Notes (Addendum)
Pt yelling and telling nurses " I don't trust anyone here". Pt being uncooperative at times. MD aware

## 2016-11-07 NOTE — ED Notes (Signed)
Pt husband at bedside

## 2016-11-07 NOTE — ED Triage Notes (Addendum)
Pt to ED via EMS from home with c/o witnessed seizure. Per EMS pt was not postictal upon arrival. Pt had pseudo seizure like activity upon arrival . Pt A&Ox4, crying  during assessment. HR 120s. MD at bedside . Pt states " I don't trust anyone"

## 2016-11-07 NOTE — ED Provider Notes (Signed)
[facility] Emergency Department Provider Note       Time seen: ----------------------------------------- 7:54 PM on 11/07/2016 -----------------------------------------     I have reviewed the triage vital signs and the nursing notes.   HISTORY   Chief Complaint Seizures    HPI Rachael Gray is a 36 y.o. female who presents to the ED for possible seizure. Patient arrives via EMS from home with seizure-like activity. EMS reports she was not postictal on arrival. On arrival here she was noted to have voluntary shaking-like activity where she was crying, hyperventilating and vomiting. Patient states that she did not trust anyone and was refusing to see certain providers.   Past Medical History:  Diagnosis Date  . Diabetes mellitus without complication ([place])   . Gastroparesis   . Lupus   . Pseudoseizures   . Seizures [facility])     Patient Active Problem List   Diagnosis Date Noted  . Hypokalemia 12/24/2015  . Anemia 12/24/2015  . Diabetes ([place]) 12/24/2015  . Gastroparesis due to DM ([place])   . Acute respiratory alkalosis 12/22/2015  . Duodenitis 12/22/2015  . Vomiting 12/16/2015  . Intractable nausea and vomiting 12/10/2014  . Nausea & vomiting 12/10/2014  . Narcotic abuse 12/09/2014  . Sepsis ([place]) 12/07/2014  . Anxiety   . Pyelonephritis 12/04/2014  . Lactic acidosis 12/04/2014    Class: Acute    Past Surgical History:  Procedure Laterality Date  . APPENDECTOMY    . CHOLECYSTECTOMY      Allergies Doxycycline; Morphine and related; and Tylenol [acetaminophen]  Social History Social History  Substance Use Topics  . Smoking status: Former Smoker    Packs/day: 2.00    Years: 10.00    Types: Cigarettes  . Smokeless tobacco: Never Used  . Alcohol use No    Review of Systems Constitutional: Negative for fever. Cardiovascular: Negative for chest pain. Respiratory: Negative for shortness of breath. Gastrointestinal: Negative  for abdominal pain, Positive for vomiting Genitourinary: Negative for dysuria. Musculoskeletal: Negative for back pain. Skin: Negative for rash. Neurological: Negative for headaches, focal weakness or numbness.Positive for seizure-like activity  All systems negative/normal/unremarkable except as stated in the HPI  ____________________________________________   PHYSICAL EXAM:  VITAL SIGNS: ED Triage Vitals  Enc Vitals Group     BP 11/07/16 1922 (!) 158/105     Pulse Rate 11/07/16 1922 (!) 113     Resp 11/07/16 1922 16     Temp --      Temp src --      SpO2 11/07/16 1922 96 %     Weight --      Height --      Head Circumference --      Peak Flow --      Pain Score 11/07/16 1925 10     Pain Loc --      Pain Edu? --      Excl. in [place]? --    Constitutional: Anxious and hyperventilating, mild distress Eyes: Conjunctivae are normal. Normal extraocular movements. ENT   Head: Normocephalic and atraumatic.   Nose: No congestion/rhinnorhea.   Mouth/Throat: Mucous membranes are moist. No intraoral lesions or tongue laceration   Neck: No stridor. Cardiovascular: Rapid rate, regular rhythm. No murmurs, rubs, or gallops. Respiratory: Normal respiratory effort without tachypnea nor retractions. Breath sounds are clear and equal bilaterally. No wheezes/rales/rhonchi. Gastrointestinal: Soft and nontender. Normal bowel sounds Musculoskeletal: Nontender with normal range of motion in extremities. No lower extremity tenderness nor edema. Neurologic:  Normal  speech and language. No gross focal neurologic deficits are appreciated. Voluntary tremor is noted Skin:  Skin is warm, dry and intact. No rash noted. Psychiatric: Mood and affect are normal. Speech and behavior are normal.  ____________________________________________  EKG: Interpreted by me. Sinus tachycardia with a rate of 109 bpm, normal PR interval, normal QRS, normal  QT.  ____________________________________________  ED COURSE:  Pertinent labs & imaging results that were available during my care of the patient were reviewed by me and considered in my medical decision making (see chart for details). Patient presents for seizure-like activity and likely pseudoseizure, we will assess with labs as indicated   Procedures ____________________________________________   LABS (pertinent positives/negatives)  Labs Reviewed  GLUCOSE, CAPILLARY - Abnormal; Notable for the following:       Result Value   Glucose-Capillary 267 (*)    All other components within normal limits  CBC WITH DIFFERENTIAL/PLATELET  COMPREHENSIVE METABOLIC PANEL  CBG MONITORING, ED    ____________________________________________  FINAL ASSESSMENT AND PLAN  Pseudoseizure  Plan: Patient had presented for Seizure-like activity which was clearly not a generalized epileptic seizure. She became angry and belligerent towards the staff and is extricating herself from the ER. She is leaving [place].   Earleen Newport, MD   Note: This note was generated in part or whole with voice recognition software. Voice recognition is usually quite accurate but there are transcription errors that can and very often do occur. I apologize for any typographical errors that were not detected and corrected.     Earleen Newport, MD 11/07/16 2108

## 2016-11-07 NOTE — ED Notes (Signed)
Warm blanket given at this time 

## 2016-11-07 NOTE — ED Notes (Addendum)
Pt calling out states wants another doctor, RN informed pt that she can't chose physicians  in the ED , Pt husband states he is "Network engineer" and he wants another doctor. MD made aware

## 2016-11-07 NOTE — ED Notes (Signed)
Seizure padding placed on side rails at this time

## 2016-11-07 NOTE — ED Notes (Signed)
ED Provider at bedside. 

## 2016-11-21 DIAGNOSIS — R1110 Vomiting, unspecified: Secondary | ICD-10-CM | POA: Diagnosis not present

## 2016-11-21 DIAGNOSIS — Z79899 Other long term (current) drug therapy: Secondary | ICD-10-CM | POA: Diagnosis not present

## 2016-11-21 DIAGNOSIS — Z87891 Personal history of nicotine dependence: Secondary | ICD-10-CM | POA: Diagnosis not present

## 2016-11-21 DIAGNOSIS — K3184 Gastroparesis: Secondary | ICD-10-CM | POA: Diagnosis not present

## 2016-11-21 DIAGNOSIS — E119 Type 2 diabetes mellitus without complications: Secondary | ICD-10-CM | POA: Diagnosis not present

## 2016-11-21 DIAGNOSIS — R1084 Generalized abdominal pain: Secondary | ICD-10-CM | POA: Diagnosis present

## 2016-11-21 MED ORDER — ONDANSETRON 4 MG PO TBDP
4.0000 mg | ORAL_TABLET | Freq: Once | ORAL | Status: AC | PRN
Start: 2016-11-21 — End: 2016-11-21
  Administered 2016-11-21: 4 mg via ORAL
  Filled 2016-11-21: qty 1

## 2016-11-21 NOTE — ED Triage Notes (Signed)
Patient c/o generalized abdominal pain and emesis. Pt is tearful and actively vomiting in triage

## 2016-11-22 ENCOUNTER — Emergency Department
Admission: EM | Admit: 2016-11-22 | Discharge: 2016-11-22 | Disposition: A | Discharge status: Home / Self Care | Payer: Medicaid Other | Source: Home / Self Care | Attending: Emergency Medicine | Admitting: Emergency Medicine

## 2016-11-22 ENCOUNTER — Emergency Department
Admission: EM | Admit: 2016-11-22 | Discharge: 2016-11-22 | Disposition: A | Discharge status: Home / Self Care | Payer: Medicaid Other | Attending: Emergency Medicine | Admitting: Emergency Medicine

## 2016-11-22 DIAGNOSIS — F445 Conversion disorder with seizures or convulsions: Secondary | ICD-10-CM

## 2016-11-22 DIAGNOSIS — K3184 Gastroparesis: Secondary | ICD-10-CM

## 2016-11-22 LAB — CBC
HCT: 42.7 % (ref 35.0–47.0)
Hemoglobin: 14.3 g/dL (ref 12.0–16.0)
MCH: 28.8 pg (ref 26.0–34.0)
MCHC: 33.6 g/dL (ref 32.0–36.0)
MCV: 85.9 fL (ref 80.0–100.0)
PLATELETS: 245 10*3/uL (ref 150–440)
RBC: 4.97 MIL/uL (ref 3.80–5.20)
RDW: 12.7 % (ref 11.5–14.5)
WBC: 11.1 10*3/uL — AB (ref 3.6–11.0)

## 2016-11-22 LAB — COMPREHENSIVE METABOLIC PANEL
ALT: 8 U/L — AB (ref 14–54)
AST: 23 U/L (ref 15–41)
Albumin: 4.9 g/dL (ref 3.5–5.0)
Alkaline Phosphatase: 39 U/L (ref 38–126)
Anion gap: 17 — ABNORMAL HIGH (ref 5–15)
BILIRUBIN TOTAL: 0.8 mg/dL (ref 0.3–1.2)
BUN: 7 mg/dL (ref 6–20)
CHLORIDE: 105 mmol/L (ref 101–111)
CO2: 18 mmol/L — ABNORMAL LOW (ref 22–32)
CREATININE: 0.54 mg/dL (ref 0.44–1.00)
Calcium: 10 mg/dL (ref 8.9–10.3)
Glucose, Bld: 294 mg/dL — ABNORMAL HIGH (ref 65–99)
Potassium: 3.8 mmol/L (ref 3.5–5.1)
Sodium: 140 mmol/L (ref 135–145)
TOTAL PROTEIN: 8.4 g/dL — AB (ref 6.5–8.1)

## 2016-11-22 LAB — BASIC METABOLIC PANEL
ANION GAP: 11 (ref 5–15)
BUN: 7 mg/dL (ref 6–20)
CALCIUM: 8.8 mg/dL — AB (ref 8.9–10.3)
CO2: 22 mmol/L (ref 22–32)
Chloride: 107 mmol/L (ref 101–111)
Creatinine, Ser: 0.45 mg/dL (ref 0.44–1.00)
Glucose, Bld: 282 mg/dL — ABNORMAL HIGH (ref 65–99)
POTASSIUM: 3.5 mmol/L (ref 3.5–5.1)
SODIUM: 140 mmol/L (ref 135–145)

## 2016-11-22 LAB — LIPASE, BLOOD: LIPASE: 30 U/L (ref 11–51)

## 2016-11-22 MED ORDER — HALOPERIDOL LACTATE 5 MG/ML IJ SOLN
2.0000 mg | Freq: Once | INTRAMUSCULAR | Status: AC
  Administered 2016-11-22: 2 mg via INTRAVENOUS
  Filled 2016-11-22: qty 1

## 2016-11-22 MED ORDER — METOCLOPRAMIDE HCL 5 MG/ML IJ SOLN: 10.0000 mg | Freq: Once | INTRAMUSCULAR | Status: DC

## 2016-11-22 MED ORDER — SODIUM CHLORIDE 0.9 % IV BOLUS (SEPSIS)
1000.0000 mL | Freq: Once | INTRAVENOUS | Status: AC
  Administered 2016-11-22: 1000 mL via INTRAVENOUS

## 2016-11-22 MED ORDER — ONDANSETRON 4 MG PO TBDP: 4.0000 mg | ORAL_TABLET | Freq: Three times a day (TID) | ORAL | 0 refills | Status: DC | PRN

## 2016-11-22 MED ORDER — METOCLOPRAMIDE HCL 10 MG PO TABS: 10.0000 mg | ORAL_TABLET | Freq: Three times a day (TID) | ORAL | 1 refills | Status: DC

## 2016-11-22 MED ORDER — METOCLOPRAMIDE HCL 5 MG/ML IJ SOLN
20.0000 mg | Freq: Once | INTRAVENOUS | Status: DC
  Filled 2016-11-22: qty 4

## 2016-11-22 NOTE — ED Notes (Signed)
ED Provider at bedside. 

## 2016-11-22 NOTE — ED Notes (Signed)
Pt states feeling better at this time. Denies needs at this time.

## 2016-11-22 NOTE — ED Notes (Signed)
Pt cleared for d/c. Denies further questions.

## 2016-11-22 NOTE — ED Triage Notes (Signed)
Pt comes into the ED via EMS from home with c/o having seizure today, states she was seen here for the same yesterday but did not get the med filled until this morning..EDP at the bedside and knows the pt, states no test needed at this time, the pt has a h\x of pseudoseizure.

## 2016-11-22 NOTE — ED Notes (Signed)
Dr. Jimmye Norman already in room to see patient, no orders at this time for labs or any other treatment. Pt made aware of this by Dr. Jimmye Norman.  Will continue to monitor.

## 2016-11-22 NOTE — ED Notes (Signed)
Pt husband is upset and requesting to speak with EDP, states the last time she was here the EDP said she was faking and he wants to speak with him. EDP notified and is going into  Speak with the husband and patient.

## 2016-11-22 NOTE — ED Notes (Addendum)
Pt states vomiting has ceased until time for d/c but does not wish to stay  Or talk to Dr.Brown now that it has resumed "I feel fine". pt does agree to stay for repeat blood testing. Pt. Verbalizes understanding of d/c instructions, prescriptions, and follow-up. VS stable and pain controlled per pt.  Pt. In NAD at time of d/c and denies further concerns regarding this visit. Pt. Stable at the time of departure from the unit, departing unit by the safest and most appropriate manner per that pt condition and limitations. Pt advised to return to the ED at any time for emergent concerns, or for new/worsening symptoms.

## 2016-11-22 NOTE — ED Notes (Signed)
Pt witnessed leaving with husband after EDP went in to speak with them.

## 2016-11-22 NOTE — ED Notes (Signed)
Pt up to toilet wihtout complications,.

## 2016-11-22 NOTE — ED Notes (Signed)
Pt given water 

## 2016-11-22 NOTE — ED Provider Notes (Signed)
[facility] Emergency Department Provider Note       Time seen: ----------------------------------------- 10:30 AM on 11/22/2016 -----------------------------------------     I have reviewed the triage vital signs and the nursing notes.   HISTORY   Chief Complaint pseudoseizure    HPI Rachael Gray is a 36 y.o. female who presents to the ED for possible seizure-like activity today. Patient states she was seen here for same yesterday but did not get the medication filled until this morning. She denies any other injuries or complaints at this time. She does describe a discomfort as 7 out of 10 but it is unclear where.   Past Medical History:  Diagnosis Date  . Diabetes mellitus without complication ([place])   . Gastroparesis   . Lupus   . Pseudoseizures   . Seizures [facility])     Patient Active Problem List   Diagnosis Date Noted  . Hypokalemia 12/24/2015  . Anemia 12/24/2015  . Diabetes ([place]) 12/24/2015  . Gastroparesis due to DM ([place])   . Acute respiratory alkalosis 12/22/2015  . Duodenitis 12/22/2015  . Vomiting 12/16/2015  . Intractable nausea and vomiting 12/10/2014  . Nausea & vomiting 12/10/2014  . Narcotic abuse 12/09/2014  . Sepsis ([place]) 12/07/2014  . Anxiety   . Pyelonephritis 12/04/2014  . Lactic acidosis 12/04/2014    Class: Acute    Past Surgical History:  Procedure Laterality Date  . APPENDECTOMY    . CHOLECYSTECTOMY      Allergies Doxycycline; Morphine and related; and Tylenol [acetaminophen]  Social History Social History  Substance Use Topics  . Smoking status: Former Smoker    Packs/day: 2.00    Years: 10.00    Types: Cigarettes  . Smokeless tobacco: Never Used  . Alcohol use No    Review of Systems Constitutional: Negative for fever. Cardiovascular: Negative for chest pain. Respiratory: Negative for shortness of breath. Gastrointestinal: Negative for abdominal pain, vomiting and  diarrhea. Genitourinary: Negative for dysuria. Musculoskeletal: Negative for back pain. Skin: Negative for rash. Neurological: Negative for headaches, focal weakness or numbness.  All systems negative/normal/unremarkable except as stated in the HPI  ____________________________________________   PHYSICAL EXAM:  VITAL SIGNS: ED Triage Vitals  Enc Vitals Group     BP 11/22/16 1020 (!) 147/79     Pulse Rate 11/22/16 1020 70     Resp 11/22/16 1020 18     Temp 11/22/16 1020 98.1 F (36.7 C)     Temp Source 11/22/16 1020 Oral     SpO2 11/22/16 1020 100 %     Weight 11/22/16 1020 130 lb (59 kg)     Height 11/22/16 1020 5\' 4"  (1.626 m)     Head Circumference --      Peak Flow --      Pain Score 11/22/16 1019 7     Pain Loc --      Pain Edu? --      Excl. in [place]? --     Constitutional: Alert and oriented. No distress Eyes: Conjunctivae are normal. Normal extraocular movements. ENT   Head: Normocephalic and atraumatic.   Nose: No congestion/rhinnorhea.   Mouth/Throat: Mucous membranes are moist.   Neck: No stridor. Cardiovascular: Normal rate, regular rhythm. No murmurs, rubs, or gallops. Respiratory: Normal respiratory effort without tachypnea nor retractions. Breath sounds are clear and equal bilaterally. No wheezes/rales/rhonchi. Gastrointestinal: Soft and nontender. Normal bowel sounds Musculoskeletal: Nontender with normal range of motion in extremities. No lower extremity tenderness nor edema. Neurologic:  Normal  speech and language. No gross focal neurologic deficits are appreciated.  Skin:  Skin is warm, dry and intact. No rash noted. ____________________________________________  ED COURSE:  Pertinent labs & imaging results that were available during my care of the patient were reviewed by me and considered in my medical decision making (see chart for details). Patient presents for likely pseudoseizure, patient is well-known to the ER and does not require  any further treatment at this time.   Procedures ____________________________________________  FINAL ASSESSMENT AND PLAN  Pseudoseizure  Plan: Patient had presented for likely pseudoseizure and has a stable medical screening exam. She is cleared for outpatient follow-up.   Earleen Newport, MD   Note: This note was generated in part or whole with voice recognition software. Voice recognition is usually quite accurate but there are transcription errors that can and very often do occur. I apologize for any typographical errors that were not detected and corrected.     Earleen Newport, MD 11/22/16 [phone]

## 2016-11-22 NOTE — ED Provider Notes (Signed)
[facility] Emergency Department Provider Note   First MD Initiated Contact with Patient 11/22/16 0100     (approximate)  I have reviewed the triage vital signs and the nursing notes.   HISTORY  Chief Complaint Abdominal Pain and Emesis    HPI Rachael Gray is a 36 y.o. female with below list of chronic medical conditions including gas paresis resident emergency department with none bloody emesis times one day.patient also admits to 10 out of 10 generalized abdominal pain. Patient states that symptoms are consistent with previous episodes of gastroparesis.patient denies any fever or febrile on presentation. Patient denies any constipation or diarrhea   Past Medical History:  Diagnosis Date  . Diabetes mellitus without complication ([place])   . Gastroparesis   . Lupus   . Pseudoseizures   . Seizures [facility])     Patient Active Problem List   Diagnosis Date Noted  . Hypokalemia 12/24/2015  . Anemia 12/24/2015  . Diabetes ([place]) 12/24/2015  . Gastroparesis due to DM ([place])   . Acute respiratory alkalosis 12/22/2015  . Duodenitis 12/22/2015  . Vomiting 12/16/2015  . Intractable nausea and vomiting 12/10/2014  . Nausea & vomiting 12/10/2014  . Narcotic abuse 12/09/2014  . Sepsis ([place]) 12/07/2014  . Anxiety   . Pyelonephritis 12/04/2014  . Lactic acidosis 12/04/2014    Class: Acute    Past Surgical History:  Procedure Laterality Date  . APPENDECTOMY    . CHOLECYSTECTOMY      Prior to Admission medications   Medication Sig Start Date End Date Taking? Authorizing Provider  Diclofenac Sodium 3 % GEL Place 1 application onto the skin every 12 (twelve) hours as needed. Patient not taking: Reported on 06/09/2016 04/13/16   Orbie Pyo, MD  feeding supplement (BOOST / RESOURCE BREEZE) LIQD Take 1 Container by mouth 3 (three) times daily between meals. Patient not taking: Reported on 06/09/2016 12/24/15   Theodoro Grist, MD  fluticasone  [facility]) 50 MCG/ACT nasal spray Place 2 sprays into both nostrils daily. 12/15/15   Hagler, Jami L, PA-C  insulin NPH-regular Human (NOVOLIN 70/30) (70-30) 100 UNIT/ML injection Inject 22-25 Units into the skin 2 (two) times daily with a meal. 25 units in the morning and 22 units in the evening Patient not taking: Reported on 06/09/2016 12/18/15   Hugelmeyer, Ubaldo Glassing, DO  levETIRAcetam (KEPPRA) 500 MG tablet Take 500 mg by mouth 3 (three) times daily.    [provider]  levETIRAcetam (KEPPRA) 750 MG tablet Take 1 tablet (750 mg total) by mouth 2 (two) times daily. Patient not taking: Reported on 06/09/2016 08/21/15   Paulette Blanch, MD  metoCLOPramide (REGLAN) 5 MG tablet Take 1 tablet (5 mg total) by mouth 4 (four) times daily -  before meals and at bedtime. 06/18/16   Dustin Flock, MD  neomycin-bacitracin-polymyxin (NEOSPORIN) ointment Apply topically 2 (two) times daily. apply to back of neck Patient not taking: Reported on 10/17/2016 06/18/16   Dustin Flock, MD  nortriptyline (PAMELOR) 25 MG capsule Take 1 capsule (25 mg total) by mouth at bedtime. Patient not taking: Reported on 10/17/2016 06/18/16   Dustin Flock, MD  ondansetron (ZOFRAN ODT) 4 MG disintegrating tablet Allow 1-2 tablets to dissolve in your mouth every 8 hours as needed for nausea/vomiting 06/09/16   Hinda Kehr, MD  ondansetron (ZOFRAN) 4 MG tablet Take 1 tablet (4 mg total) by mouth daily as needed. 04/13/16   Orbie Pyo, MD  pantoprazole (PROTONIX) 40 MG tablet Take 1  tablet (40 mg total) by mouth daily. 06/18/16   Dustin Flock, MD  promethazine (PHENERGAN) 12.5 MG tablet Take 1 tablet (12.5 mg total) by mouth every 6 (six) hours as needed for nausea or vomiting. Patient not taking: Reported on 06/09/2016 12/24/15   Theodoro Grist, MD    Allergies Doxycycline; Morphine and related; and Tylenol [acetaminophen]  Family History  Problem Relation Age of Onset  . Breast cancer Maternal Grandmother      Social History Social History  Substance Use Topics  . Smoking status: Former Smoker    Packs/day: 2.00    Years: 10.00    Types: Cigarettes  . Smokeless tobacco: Never Used  . Alcohol use No    Review of Systems Constitutional: No fever/chills Eyes: No visual changes. ENT: No sore throat. Cardiovascular: Denies chest pain. Respiratory: Denies shortness of breath. Gastrointestinal: Positive for abdominal pain and vomiting Genitourinary: Negative for dysuria. Musculoskeletal: Negative for neck pain.  Negative for back pain. Integumentary: Negative for rash. Neurological: Negative for headaches, focal weakness or numbness.   ____________________________________________   PHYSICAL EXAM:  VITAL SIGNS: ED Triage Vitals [11/21/16 2354]  Enc Vitals Group     BP (!) 156/71     Pulse Rate (!) 102     Resp (!) 22     Temp 98.2 F (36.8 C)     Temp Source Oral     SpO2 100 %     Weight 59 kg (130 lb)     Height 1.626 m (5\' 4" )     Head Circumference      Peak Flow      Pain Score 10     Pain Loc      Pain Edu?      Excl. in [place]?     Constitutional: Alert and oriented. Well appearing and in no acute distress. Eyes: Conjunctivae are normal.  Head: Atraumatic. Mouth/Throat: Mucous membranes are Dry.  Oropharynx non-erythematous. Neck: No stridor.   Cardiovascular: Normal rate, regular rhythm. Good peripheral circulation. Grossly normal heart sounds. Respiratory: Normal respiratory effort.  No retractions. Lungs CTAB. Gastrointestinal: Soft and nontender. No distention.  Musculoskeletal: No lower extremity tenderness nor edema. No gross deformities of extremities. Neurologic:  Normal speech and language. No gross focal neurologic deficits are appreciated.  Skin:  Skin is warm, dry and intact. No rash noted. Psychiatric: Mood and affect are normal. Speech and behavior are normal.  ____________________________________________   LABS (all labs ordered are listed,  but only abnormal results are displayed)  Labs Reviewed  COMPREHENSIVE METABOLIC PANEL - Abnormal; Notable for the following:       Result Value   CO2 18 (*)    Glucose, Bld 294 (*)    Total Protein 8.4 (*)    ALT 8 (*)    Anion gap 17 (*)    All other components within normal limits  CBC - Abnormal; Notable for the following:    WBC 11.1 (*)    All other components within normal limits  LIPASE, BLOOD  URINALYSIS, COMPLETE (UACMP) WITH MICROSCOPIC     Procedures   ____________________________________________   INITIAL IMPRESSION / ASSESSMENT AND PLAN / ED COURSE  Pertinent labs & imaging results that were available during my care of the patient were reviewed by me and considered in my medical decision making (see chart for details).  36 year old female presenting with history of physical exam consistent with previous episode of gastroparesis. Patient given 2 L IV normal saline and Zofran and Reglan  in the emergency department with resolution of vomiting. Of note patient's initial laboratory data revealed glucose of 294 with an elevated anion gap of 17 however following IV hydration patient's anion gap on repeat BMP was 11. I suspect DKA to be unlikely      ____________________________________________  FINAL CLINICAL IMPRESSION(S) / ED DIAGNOSES  Final diagnoses:  Gastroparesis     MEDICATIONS GIVEN DURING THIS VISIT:  Medications  metoCLOPramide (REGLAN) injection 10 mg (not administered)  ondansetron (ZOFRAN-ODT) disintegrating tablet 4 mg (4 mg Oral Given 11/21/16 2359)  sodium chloride 0.9 % bolus 1,000 mL (1,000 mLs Intravenous New Bag/Given 11/22/16 0150)  sodium chloride 0.9 % bolus 1,000 mL (0 mLs Intravenous Stopped 11/22/16 0135)  haloperidol lactate (HALDOL) injection 2 mg (2 mg Intravenous Given 11/22/16 0034)     NEW OUTPATIENT MEDICATIONS STARTED DURING THIS VISIT:  New Prescriptions   No medications on file    Modified Medications   No  medications on file    Discontinued Medications   No medications on file     Note:  This document was prepared using Dragon voice recognition software and may include unintentional dictation errors.    Gregor Hams, MD 11/22/16 [phone]

## 2016-11-29 ENCOUNTER — Emergency Department
Admission: EM | Admit: 2016-11-29 | Discharge: 2016-11-29 | Disposition: A | Discharge status: Home / Self Care | Payer: Medicaid Other | Attending: Emergency Medicine | Admitting: Emergency Medicine

## 2016-11-29 DIAGNOSIS — Z79899 Other long term (current) drug therapy: Secondary | ICD-10-CM | POA: Diagnosis not present

## 2016-11-29 DIAGNOSIS — R569 Unspecified convulsions: Secondary | ICD-10-CM | POA: Diagnosis present

## 2016-11-29 DIAGNOSIS — F445 Conversion disorder with seizures or convulsions: Secondary | ICD-10-CM | POA: Insufficient documentation

## 2016-11-29 DIAGNOSIS — E119 Type 2 diabetes mellitus without complications: Secondary | ICD-10-CM | POA: Insufficient documentation

## 2016-11-29 DIAGNOSIS — Z794 Long term (current) use of insulin: Secondary | ICD-10-CM | POA: Diagnosis not present

## 2016-11-29 DIAGNOSIS — F17210 Nicotine dependence, cigarettes, uncomplicated: Secondary | ICD-10-CM | POA: Diagnosis not present

## 2016-11-29 LAB — CBC
HEMATOCRIT: 43.1 % (ref 35.0–47.0)
Hemoglobin: 15.1 g/dL (ref 12.0–16.0)
MCH: 28.7 pg (ref 26.0–34.0)
MCHC: 35 g/dL (ref 32.0–36.0)
MCV: 82 fL (ref 80.0–100.0)
PLATELETS: 233 10*3/uL (ref 150–440)
RBC: 5.25 MIL/uL — ABNORMAL HIGH (ref 3.80–5.20)
RDW: 12.6 % (ref 11.5–14.5)
WBC: 15.3 10*3/uL — AB (ref 3.6–11.0)

## 2016-11-29 LAB — COMPREHENSIVE METABOLIC PANEL
ALBUMIN: 4.4 g/dL (ref 3.5–5.0)
ALT: 11 U/L — ABNORMAL LOW (ref 14–54)
ANION GAP: 20 — AB (ref 5–15)
AST: 26 U/L (ref 15–41)
Alkaline Phosphatase: 45 U/L (ref 38–126)
BILIRUBIN TOTAL: 0.7 mg/dL (ref 0.3–1.2)
BUN: 11 mg/dL (ref 6–20)
CALCIUM: 10 mg/dL (ref 8.9–10.3)
CO2: 21 mmol/L — ABNORMAL LOW (ref 22–32)
Chloride: 96 mmol/L — ABNORMAL LOW (ref 101–111)
Creatinine, Ser: 0.61 mg/dL (ref 0.44–1.00)
GFR calc Af Amer: >60 mL/min (ref >60)
GFR calc non Af Amer: >60 mL/min (ref >60)
GLUCOSE: 341 mg/dL — AB (ref 65–99)
Potassium: 2.7 mmol/L — CL (ref 3.5–5.1)
Sodium: 137 mmol/L (ref 135–145)
TOTAL PROTEIN: 7.9 g/dL (ref 6.5–8.1)

## 2016-11-29 LAB — BASIC METABOLIC PANEL
Anion gap: 12 (ref 5–15)
BUN: 9 mg/dL (ref 6–20)
CALCIUM: 8.8 mg/dL — AB (ref 8.9–10.3)
CO2: 26 mmol/L (ref 22–32)
Chloride: 101 mmol/L (ref 101–111)
Creatinine, Ser: 0.45 mg/dL (ref 0.44–1.00)
GFR calc non Af Amer: >60 mL/min (ref >60)
Glucose, Bld: 248 mg/dL — ABNORMAL HIGH (ref 65–99)
Potassium: 2.7 mmol/L — CL (ref 3.5–5.1)
SODIUM: 139 mmol/L (ref 135–145)

## 2016-11-29 LAB — VALPROIC ACID LEVEL

## 2016-11-29 MED ORDER — SODIUM CHLORIDE 0.9 % IV SOLN
1000.0000 mL | Freq: Once | INTRAVENOUS | Status: AC
  Administered 2016-11-29: 1000 mL via INTRAVENOUS

## 2016-11-29 MED ORDER — LORAZEPAM 2 MG/ML IJ SOLN
2.0000 mg | Freq: Once | INTRAMUSCULAR | Status: AC
  Administered 2016-11-29: 2 mg via INTRAVENOUS

## 2016-11-29 MED ORDER — POTASSIUM CHLORIDE CRYS ER 20 MEQ PO TBCR
40.0000 meq | EXTENDED_RELEASE_TABLET | Freq: Once | ORAL | Status: AC
  Administered 2016-11-29: 40 meq via ORAL
  Filled 2016-11-29: qty 2

## 2016-11-29 MED ORDER — LORAZEPAM 2 MG/ML IJ SOLN
2.0000 mg | Freq: Once | INTRAMUSCULAR | Status: DC
  Filled 2016-11-29: qty 1

## 2016-11-29 NOTE — ED Triage Notes (Signed)
Pt arrives via ems from home, ems reports pt has been shaking like this since 4am. Pt is shaking all over and talking to staff to update information. Pt's eyes open and making eye contact with staff while shaking, sister reports that she was transferred to [facility] and discharged on Monday. Pt is to follow up today at [facility] at 1440

## 2016-11-29 NOTE — ED Provider Notes (Signed)
[facility] Emergency Department Provider Note   ____________________________________________    I have reviewed the triage vital signs and the nursing notes.   HISTORY  Chief Complaint Shaking     HPI Rachael Gray is a 36 y.o. female who presents for seizure-like activity. Patient presented via EMS for reported continuous seizure-like activity for the last 4-5 hours. EMS gave Versed with little improvement. Patient is able to respond to questions during her "seizures ".Caregiver reports the patient was recently at [facility] and that her seizures are real. I was able to eventually review her [facility] records and they essentially confirm non-epileptiform psychogenic seizures.   Past Medical History:  Diagnosis Date  . Diabetes mellitus without complication ([place])   . Gastroparesis   . Lupus   . Pseudoseizures   . Seizures [facility])     Patient Active Problem List   Diagnosis Date Noted  . Hypokalemia 12/24/2015  . Anemia 12/24/2015  . Diabetes ([place]) 12/24/2015  . Gastroparesis due to DM ([place])   . Acute respiratory alkalosis 12/22/2015  . Duodenitis 12/22/2015  . Vomiting 12/16/2015  . Intractable nausea and vomiting 12/10/2014  . Nausea & vomiting 12/10/2014  . Narcotic abuse 12/09/2014  . Sepsis ([place]) 12/07/2014  . Anxiety   . Pyelonephritis 12/04/2014  . Lactic acidosis 12/04/2014    Class: Acute    Past Surgical History:  Procedure Laterality Date  . APPENDECTOMY    . CHOLECYSTECTOMY      Prior to Admission medications   Medication Sig Start Date End Date Taking? Authorizing Provider  Diclofenac Sodium 3 % GEL Place 1 application onto the skin every 12 (twelve) hours as needed. Patient not taking: Reported on 06/09/2016 04/13/16   Orbie Pyo, MD  feeding supplement (BOOST / RESOURCE BREEZE) LIQD Take 1 Container by mouth 3 (three) times daily between meals. Patient not taking: Reported on 06/09/2016 12/24/15   Theodoro Grist,  MD  fluticasone [facility]) 50 MCG/ACT nasal spray Place 2 sprays into both nostrils daily. 12/15/15   Hagler, Jami L, PA-C  insulin NPH-regular Human (NOVOLIN 70/30) (70-30) 100 UNIT/ML injection Inject 22-25 Units into the skin 2 (two) times daily with a meal. 25 units in the morning and 22 units in the evening Patient not taking: Reported on 06/09/2016 12/18/15   Hugelmeyer, Ubaldo Glassing, DO  levETIRAcetam (KEPPRA) 500 MG tablet Take 500 mg by mouth 3 (three) times daily.    [provider]  levETIRAcetam (KEPPRA) 750 MG tablet Take 1 tablet (750 mg total) by mouth 2 (two) times daily. Patient not taking: Reported on 06/09/2016 08/21/15   Paulette Blanch, MD  metoCLOPramide (REGLAN) 10 MG tablet Take 1 tablet (10 mg total) by mouth 3 (three) times daily with meals. 11/22/16 12/22/16  Gregor Hams, MD  metoCLOPramide (REGLAN) 5 MG tablet Take 1 tablet (5 mg total) by mouth 4 (four) times daily -  before meals and at bedtime. 06/18/16   Dustin Flock, MD  neomycin-bacitracin-polymyxin (NEOSPORIN) ointment Apply topically 2 (two) times daily. apply to back of neck Patient not taking: Reported on 10/17/2016 06/18/16   Dustin Flock, MD  nortriptyline (PAMELOR) 25 MG capsule Take 1 capsule (25 mg total) by mouth at bedtime. Patient not taking: Reported on 10/17/2016 06/18/16   Dustin Flock, MD  ondansetron (ZOFRAN ODT) 4 MG disintegrating tablet Allow 1-2 tablets to dissolve in your mouth every 8 hours as needed for nausea/vomiting 06/09/16   Hinda Kehr, MD  ondansetron (ZOFRAN ODT) 4  MG disintegrating tablet Take 1 tablet (4 mg total) by mouth every 8 (eight) hours as needed for nausea or vomiting. 11/22/16   Gregor Hams, MD  ondansetron (ZOFRAN) 4 MG tablet Take 1 tablet (4 mg total) by mouth daily as needed. 04/13/16   Orbie Pyo, MD  pantoprazole (PROTONIX) 40 MG tablet Take 1 tablet (40 mg total) by mouth daily. 06/18/16   Dustin Flock, MD  promethazine (PHENERGAN) 12.5 MG  tablet Take 1 tablet (12.5 mg total) by mouth every 6 (six) hours as needed for nausea or vomiting. Patient not taking: Reported on 06/09/2016 12/24/15   Theodoro Grist, MD     Allergies Dilantin [phenytoin sodium extended]; Doxycycline; Morphine and related; and Tylenol [acetaminophen]  Family History  Problem Relation Age of Onset  . Breast cancer Maternal Grandmother     Social History Social History  Substance Use Topics  . Smoking status: Former Smoker    Packs/day: 2.00    Years: 10.00    Types: Cigarettes  . Smokeless tobacco: Never Used  . Alcohol use No    L5 caveat: Unable to obtain Review of Systems because of seizure-like activity     ____________________________________________   PHYSICAL EXAM:  VITAL SIGNS: ED Triage Vitals  Enc Vitals Group     BP 11/29/16 1038 131/79     Pulse Rate 11/29/16 1109 (!) 107     Resp 11/29/16 1038 18     Temp 11/29/16 1038 97.8 F (36.6 C)     Temp Source 11/29/16 1038 Axillary     SpO2 11/29/16 1038 99 %     Weight 11/29/16 1039 59 kg (130 lb)     Height 11/29/16 1039 1.626 m (5\' 4" )     Head Circumference --      Peak Flow --      Pain Score 11/29/16 1038 6     Pain Loc --      Pain Edu? --      Excl. in [place]? --     Constitutional:Generalized shaking Eyes: Conjunctivae are normal.  Head: Atraumatic. Nose: No congestion/rhinnorhea. Mouth/Throat: Mucous membranes are moist.    Cardiovascular: Tachycardia, regular rhythm. Grossly normal heart sounds.  Good peripheral circulation. Respiratory: Normal respiratory effort.  No retractions. Lungs CTAB. Gastrointestinal: Soft and nontender. No distention.  No CVA tenderness. Genitourinary: deferred Musculoskeletal: .  Warm and well perfused Neurologic:   No gross focal neurologic deficits are appreciated.  Skin:  Skin is warm, dry and intact. No rash noted. Psychiatric: Unable to examine  ____________________________________________   LABS (all labs ordered are  listed, but only abnormal results are displayed)  Labs Reviewed  CBC - Abnormal; Notable for the following:       Result Value   WBC 15.3 (*)    RBC 5.25 (*)    All other components within normal limits  COMPREHENSIVE METABOLIC PANEL - Abnormal; Notable for the following:    Potassium 2.7 (*)    Chloride 96 (*)    CO2 21 (*)    Glucose, Bld 341 (*)    ALT 11 (*)    Anion gap 20 (*)    All other components within normal limits  VALPROIC ACID LEVEL - Abnormal; Notable for the following:    Valproic Acid Lvl <10 (*)    All other components within normal limits  BASIC METABOLIC PANEL - Abnormal; Notable for the following:    Potassium 2.7 (*)    Glucose, Bld 248 (*)  Calcium 8.8 (*)    All other components within normal limits   ____________________________________________  EKG  ED ECG REPORT I, Lavonia Drafts, the attending physician, personally viewed and interpreted this ECG.  Date: 11/29/2016  Rhythm: normal sinus rhythm QRS Axis: normal Intervals: normal ST/T Wave abnormalities: normal Narrative Interpretation: no evidence of acute ischemia  ____________________________________________  RADIOLOGY  None ____________________________________________   PROCEDURES  Procedure(s) performed: No    Critical Care performed: No ____________________________________________   INITIAL IMPRESSION / ASSESSMENT AND PLAN / ED COURSE  Pertinent labs & imaging results that were available during my care of the patient were reviewed by me and considered in my medical decision making (see chart for details).  I saw patient as soon as she came in via EMS in my initial impression was status epilepticus and I ordered 2 mg of IV Ativan. Upon reviewing records however discover the patient has a history of pseudoseizures, family reports that she was at [facility] and they found her to have real seizures. There was some difficulty in accessing records via care everywhere however I have  been able to review the admission notes and it essentially confirms the diagnosis of pseudoseizures. Patient has elevated anion gap on labs but doubt DKA, we'll give IV fluids and recheck as well as replete potassium.   Patient's repeat BMP shows a normal anion gap. She is anxious to leave and does not want to stay any longer. Do not see any indication for admission.    ____________________________________________   FINAL CLINICAL IMPRESSION(S) / ED DIAGNOSES  Final diagnoses:  Pseudoseizure      NEW MEDICATIONS STARTED DURING THIS VISIT:  Discharge Medication List as of 11/29/2016  2:48 PM       Note:  This document was prepared using Dragon voice recognition software and may include unintentional dictation errors.    Lavonia Drafts, MD 11/29/16 [phone]

## 2016-11-30 DIAGNOSIS — F445 Conversion disorder with seizures or convulsions: Secondary | ICD-10-CM | POA: Insufficient documentation

## 2016-12-01 DIAGNOSIS — F1220 Cannabis dependence, uncomplicated: Secondary | ICD-10-CM | POA: Diagnosis present

## 2016-12-05 ENCOUNTER — Encounter: Payer: Self-pay | Admitting: Emergency Medicine

## 2016-12-05 ENCOUNTER — Emergency Department
Admission: EM | Admit: 2016-12-05 | Discharge: 2016-12-06 | Disposition: A | Discharge status: Home / Self Care | Payer: Medicaid Other | Attending: Emergency Medicine | Admitting: Emergency Medicine

## 2016-12-05 DIAGNOSIS — R569 Unspecified convulsions: Secondary | ICD-10-CM | POA: Insufficient documentation

## 2016-12-05 DIAGNOSIS — E119 Type 2 diabetes mellitus without complications: Secondary | ICD-10-CM | POA: Insufficient documentation

## 2016-12-05 DIAGNOSIS — Z79899 Other long term (current) drug therapy: Secondary | ICD-10-CM | POA: Diagnosis not present

## 2016-12-05 DIAGNOSIS — Z87891 Personal history of nicotine dependence: Secondary | ICD-10-CM | POA: Insufficient documentation

## 2016-12-05 DIAGNOSIS — Z794 Long term (current) use of insulin: Secondary | ICD-10-CM | POA: Diagnosis not present

## 2016-12-05 LAB — CBC WITH DIFFERENTIAL/PLATELET
BASOS ABS: 0 10*3/uL (ref 0–0.1)
BASOS PCT: 1 %
EOS ABS: 0.1 10*3/uL (ref 0–0.7)
EOS PCT: 1 %
HCT: 40.3 % (ref 35.0–47.0)
Hemoglobin: 13.6 g/dL (ref 12.0–16.0)
LYMPHS ABS: 1.1 10*3/uL (ref 1.0–3.6)
Lymphocytes Relative: 13 %
MCH: 29.1 pg (ref 26.0–34.0)
MCHC: 33.8 g/dL (ref 32.0–36.0)
MCV: 85.9 fL (ref 80.0–100.0)
MONO ABS: 0.4 10*3/uL (ref 0.2–0.9)
MONOS PCT: 5 %
NEUTROS ABS: 7.1 10*3/uL — AB (ref 1.4–6.5)
NEUTROS PCT: 80 %
PLATELETS: 178 10*3/uL (ref 150–440)
RBC: 4.69 MIL/uL (ref 3.80–5.20)
RDW: 12.6 % (ref 11.5–14.5)
WBC: 8.7 10*3/uL (ref 3.6–11.0)

## 2016-12-05 LAB — ETHANOL: Alcohol, Ethyl (B): <5 mg/dL (ref <5)

## 2016-12-05 LAB — COMPREHENSIVE METABOLIC PANEL
ALT: 17 U/L (ref 14–54)
ANION GAP: 18 — AB (ref 5–15)
AST: 36 U/L (ref 15–41)
Albumin: 4.2 g/dL (ref 3.5–5.0)
Alkaline Phosphatase: 36 U/L — ABNORMAL LOW (ref 38–126)
BUN: 7 mg/dL (ref 6–20)
CHLORIDE: 99 mmol/L — AB (ref 101–111)
CO2: 23 mmol/L (ref 22–32)
CREATININE: 0.7 mg/dL (ref 0.44–1.00)
Calcium: 10 mg/dL (ref 8.9–10.3)
GFR calc non Af Amer: >60 mL/min (ref >60)
Glucose, Bld: 215 mg/dL — ABNORMAL HIGH (ref 65–99)
POTASSIUM: 2.7 mmol/L — AB (ref 3.5–5.1)
SODIUM: 140 mmol/L (ref 135–145)
Total Bilirubin: 0.4 mg/dL (ref 0.3–1.2)
Total Protein: 7.5 g/dL (ref 6.5–8.1)

## 2016-12-05 MED ORDER — HALOPERIDOL LACTATE 5 MG/ML IJ SOLN
5.0000 mg | Freq: Once | INTRAMUSCULAR | Status: AC
  Administered 2016-12-05: 5 mg via INTRAVENOUS
  Filled 2016-12-05: qty 1

## 2016-12-05 MED ORDER — ONDANSETRON HCL 4 MG/2ML IJ SOLN
INTRAMUSCULAR | Status: AC
  Administered 2016-12-05: 4 mg via INTRAVENOUS
  Filled 2016-12-05: qty 2

## 2016-12-05 MED ORDER — SODIUM CHLORIDE 0.9 % IV BOLUS (SEPSIS)
1000.0000 mL | Freq: Once | INTRAVENOUS | Status: AC
  Administered 2016-12-05: 1000 mL via INTRAVENOUS

## 2016-12-05 MED ORDER — METOCLOPRAMIDE HCL 5 MG/ML IJ SOLN: 20.0000 mg | Freq: Once | INTRAVENOUS | Status: DC

## 2016-12-05 MED ORDER — SODIUM CHLORIDE 0.9 % IV SOLN
1000.0000 mg | Freq: Once | INTRAVENOUS | Status: AC
  Administered 2016-12-05: 1000 mg via INTRAVENOUS
  Filled 2016-12-05: qty 10

## 2016-12-05 MED ORDER — ONDANSETRON HCL 4 MG/2ML IJ SOLN
4.0000 mg | Freq: Once | INTRAMUSCULAR | Status: AC
  Administered 2016-12-05: 4 mg via INTRAVENOUS

## 2016-12-05 MED ORDER — METOCLOPRAMIDE HCL 5 MG/ML IJ SOLN
INTRAMUSCULAR | Status: AC
  Administered 2016-12-05: 20 mg
  Filled 2016-12-05: qty 4

## 2016-12-05 MED ORDER — POTASSIUM CHLORIDE 10 MEQ/100ML IV SOLN
10.0000 meq | Freq: Once | INTRAVENOUS | Status: AC
  Administered 2016-12-05: 10 meq via INTRAVENOUS
  Filled 2016-12-05: qty 100

## 2016-12-05 NOTE — ED Triage Notes (Signed)
Pt arrives via [facility] s/p seizure episode that lasted two hours, per mother. Last seen well was 1800. Pt opening eyes spontaneously and responding to questions at this time. CBG 206, per ems. Pt received 6 mg versed IM with EMS.

## 2016-12-05 NOTE — ED Provider Notes (Signed)
[facility] Emergency Department Provider Note ____________________________________________   First MD Initiated Contact with Patient 12/05/16 2008     (approximate)  I have reviewed the triage vital signs and the nursing notes.   HISTORY  Chief Complaint Seizures  History of present illness severely limited due to altered mental status.  HPI Rachael Gray is a 36 y.o. 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 Per EMS the activity resolved after 6 mg of Versed was given IM.  Past Medical History:  Diagnosis Date  . Diabetes mellitus without complication ([place])   . Gastroparesis   . Lupus   . Pseudoseizures   . Seizures [facility])     Patient Active Problem List   Diagnosis Date Noted  . Hypokalemia 12/24/2015  . Anemia 12/24/2015  . Diabetes ([place]) 12/24/2015  . Gastroparesis due to DM ([place])   . Acute respiratory alkalosis 12/22/2015  . Duodenitis 12/22/2015  . Vomiting 12/16/2015  . Intractable nausea and vomiting 12/10/2014  . Nausea & vomiting 12/10/2014  . Narcotic abuse 12/09/2014  . Sepsis ([place]) 12/07/2014  . Anxiety   . Pyelonephritis 12/04/2014  . Lactic acidosis 12/04/2014    Class: Acute    Past Surgical History:  Procedure Laterality Date  . APPENDECTOMY    . CHOLECYSTECTOMY      Prior to Admission medications   Medication Sig Start Date End Date Taking? Authorizing Provider  Diclofenac Sodium 3 % GEL Place 1 application onto the skin every 12 (twelve) hours as needed. Patient not taking: Reported on 06/09/2016 04/13/16   Orbie Pyo, MD  feeding supplement (BOOST / RESOURCE BREEZE) LIQD Take 1 Container by mouth 3 (three) times daily between meals. Patient not taking: Reported on 06/09/2016 12/24/15   Theodoro Grist, MD  fluticasone [facility]) 50 MCG/ACT nasal spray Place  2 sprays into both nostrils daily. 12/15/15   Hagler, Jami L, PA-C  insulin NPH-regular Human (NOVOLIN 70/30) (70-30) 100 UNIT/ML injection Inject 22-25 Units into the skin 2 (two) times daily with a meal. 25 units in the morning and 22 units in the evening Patient not taking: Reported on 06/09/2016 12/18/15   Hugelmeyer, Ubaldo Glassing, DO  levETIRAcetam (KEPPRA) 500 MG tablet Take 500 mg by mouth 3 (three) times daily.    [provider]  levETIRAcetam (KEPPRA) 750 MG tablet Take 1 tablet (750 mg total) by mouth 2 (two) times daily. Patient not taking: Reported on 06/09/2016 08/21/15   Paulette Blanch, MD  nortriptyline (PAMELOR) 25 MG capsule Take 1 capsule (25 mg total) by mouth at bedtime. Patient not taking: Reported on 10/17/2016 06/18/16   Dustin Flock, MD  pantoprazole (PROTONIX) 40 MG tablet Take 1 tablet (40 mg total) by mouth daily. 06/18/16   Dustin Flock, MD    Allergies Dilantin [phenytoin sodium extended]; Doxycycline; Other; Morphine and related; and Tylenol [acetaminophen]  Family History  Problem Relation Age of Onset  . Breast cancer Maternal Grandmother     Social History Social History  Substance Use Topics  . Smoking status: Former Smoker    Packs/day: 2.00    Years: 10.00    Types: Cigarettes  . Smokeless tobacco: Never Used  . Alcohol use No    Review of Systems Level V caveat: Unable to obtain ROS due to altered mental status    ____________________________________________   PHYSICAL EXAM:  VITAL SIGNS: ED Triage  Vitals  Enc Vitals Group     BP 12/05/16 2009 113/73     Pulse Rate 12/05/16 2009 (!) 118     Resp 12/05/16 2009 17     Temp 12/05/16 2009 98.7 F (37.1 C)     Temp Source 12/05/16 2009 Oral     SpO2 12/05/16 2009 98 %     Weight 12/05/16 2010 130 lb (59 kg)     Height 12/05/16 2010 5\' 4"  (1.626 m)     Head Circumference --      Peak Flow --      Pain Score --      Pain Loc --      Pain Edu? --      Excl. in [place]? --      Constitutional: Lethargic, withdraws, moans and grimaces to painful stimuli.  Eyes: Conjunctivae are normal.  EOMI.  PERRLA.  Head: Atraumatic. Nose: No congestion/rhinnorhea. Mouth/Throat: Mucous membranes are moist.   Neck: Normal range of motion.  Cardiovascular: Tachycardia, regular rhythm. Grossly normal heart sounds.  Good peripheral circulation. Respiratory: Normal respiratory effort.  No retractions. Lungs CTAB. Gastrointestinal: Soft and nontender. No distention.  Genitourinary: No CVA tenderness. Musculoskeletal: No lower extremity edema.  Extremities warm and well perfused.  Neurologic:  No gross focal neurologic deficits are appreciated. Motor intact in all extremities. Skin:  Skin is warm and dry. No rash noted. Psychiatric: Mood and affect are normal. Speech and behavior are normal.  ____________________________________________   LABS (all labs ordered are listed, but only abnormal results are displayed)  Labs Reviewed  COMPREHENSIVE METABOLIC PANEL - Abnormal; Notable for the following:       Result Value   Potassium 2.7 (*)    Chloride 99 (*)    Glucose, Bld 215 (*)    Alkaline Phosphatase 36 (*)    Anion gap 18 (*)    All other components within normal limits  CBC WITH DIFFERENTIAL/PLATELET - Abnormal; Notable for the following:    Neutro Abs 7.1 (*)    All other components within normal limits  ETHANOL  URINALYSIS, COMPLETE (UACMP) WITH MICROSCOPIC  LEVETIRACETAM LEVEL   ____________________________________________  EKG  ED ECG REPORT I, Arta Silence, the attending physician, personally viewed and interpreted this ECG.  Date: 12/05/2016 EKG Time: 2006 Rate: 113 Rhythm: sinus tachycardia QRS Axis: normal Intervals: normal ST/T Wave abnormalities: normal Narrative Interpretation: no evidence of acute ischemia; no significant change when compared to EKG of  11/29/2016  ____________________________________________  RADIOLOGY    ____________________________________________   PROCEDURES  Procedure(s) performed: No    Critical Care performed: No ____________________________________________   INITIAL IMPRESSION / ASSESSMENT AND PLAN / ED COURSE  Pertinent labs & imaging results that were available during my care of the patient were reviewed by me and considered in my medical decision making (see chart for details).  36 year old female with history of seizure versus pseudoseizure and numerous prior emergency department visits for similar symptoms presents with an episode of apparent seizure-like activity, now resolved after she received versed in the field.  In the ED, pt tachy but other VS normal, she has normal resp effort, no trauma, nonfocal neuro, and remainder of exam is unremarkable.  Her mental status is consistent with receiving large dose of versed, vs post ictal phase.  Per Dr. 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  Overall given the extremely atypical nature of this episode and her frequent  presentations, susp a recurrent pseudoseizure.  Plan: basic labs, load with keppra, and observe.  If pt returns to baseline anticipate likely d/c home;if recurrent seizure-like activity, possible admission.     ----------------------------------------- 11:28 PM on 12/05/2016 -----------------------------------------  Patient has had a recurrence of her shaking movements. Patient is rhythmically shaking primarily her upper body and lying on her side clutching her vomit bag and spitting up into it. Patient responds to voice, looking at me and mumbling when I ask her her name. This episode is not consistent with a seizure. Per Dr. Owens Shark who is taking over for me and has seen patient in the emergency department multiple times, this is consistent with her  prior episodes in the emergency department. Given that this appears to be more of a cyclical vomiting type of episode we will give Haldol and continue to observe.  Patient signed out to Dr. Owens Shark.   ____________________________________________   FINAL CLINICAL IMPRESSION(S) / ED DIAGNOSES  Final diagnoses:  Seizure-like activity ([place])      NEW MEDICATIONS STARTED DURING THIS VISIT:  New Prescriptions   No medications on file     Note:  This document was prepared using Dragon voice recognition software and may include unintentional dictation errors.    Arta Silence, MD 12/05/16 2330

## 2016-12-06 LAB — URINALYSIS, COMPLETE (UACMP) WITH MICROSCOPIC
Bacteria, UA: NONE SEEN
Bilirubin Urine: NEGATIVE
KETONES UR: 20 mg/dL — AB
Leukocytes, UA: NEGATIVE
NITRITE: NEGATIVE
PH: 6 (ref 5.0–8.0)
PROTEIN: 30 mg/dL — AB
Specific Gravity, Urine: 1.012 (ref 1.005–1.030)

## 2016-12-06 MED ORDER — POTASSIUM CHLORIDE 20 MEQ PO PACK
PACK | ORAL | Status: AC
  Administered 2016-12-06: 40 meq via ORAL
  Filled 2016-12-06: qty 2

## 2016-12-06 MED ORDER — POTASSIUM CHLORIDE 20 MEQ PO PACK
40.0000 meq | PACK | Freq: Once | ORAL | Status: AC
  Administered 2016-12-06: 40 meq via ORAL

## 2016-12-06 NOTE — ED Provider Notes (Signed)
I assumed care of the patient from [place]. Patient given Haldol 5 mg Reglan 10 mg with complete resolution of vomiting no more seizure like activity. In addition patient given potassium chloride 40 mEq by mouth. Patient observed for an additional 3hours. she will be discharged home at this time with outpatient follow-up.   Gregor Hams, MD 12/06/16 [phone]

## 2016-12-06 NOTE — ED Notes (Addendum)
Patient is resting comfortably at this time with no signs of distress present. VS stable. Will continue to monitor.   

## 2016-12-08 LAB — LEVETIRACETAM LEVEL: Levetiracetam Lvl: NOT DETECTED ug/mL (ref 10.0–40.0)

## 2017-01-02 ENCOUNTER — Encounter: Payer: Self-pay | Admitting: Emergency Medicine

## 2017-01-02 ENCOUNTER — Emergency Department
Admission: EM | Admit: 2017-01-02 | Discharge: 2017-01-02 | Disposition: A | Discharge status: Home / Self Care | Payer: Medicaid Other | Attending: Emergency Medicine | Admitting: Emergency Medicine

## 2017-01-02 ENCOUNTER — Emergency Department: Payer: Medicaid Other

## 2017-01-02 DIAGNOSIS — R1084 Generalized abdominal pain: Secondary | ICD-10-CM | POA: Diagnosis not present

## 2017-01-02 DIAGNOSIS — Z87891 Personal history of nicotine dependence: Secondary | ICD-10-CM | POA: Insufficient documentation

## 2017-01-02 DIAGNOSIS — R112 Nausea with vomiting, unspecified: Secondary | ICD-10-CM

## 2017-01-02 DIAGNOSIS — E119 Type 2 diabetes mellitus without complications: Secondary | ICD-10-CM | POA: Diagnosis not present

## 2017-01-02 DIAGNOSIS — Z8739 Personal history of other diseases of the musculoskeletal system and connective tissue: Secondary | ICD-10-CM | POA: Insufficient documentation

## 2017-01-02 DIAGNOSIS — Z79899 Other long term (current) drug therapy: Secondary | ICD-10-CM | POA: Insufficient documentation

## 2017-01-02 DIAGNOSIS — Z794 Long term (current) use of insulin: Secondary | ICD-10-CM | POA: Diagnosis not present

## 2017-01-02 LAB — URINALYSIS, COMPLETE (UACMP) WITH MICROSCOPIC
Bacteria, UA: NONE SEEN
Bilirubin Urine: NEGATIVE
KETONES UR: 20 mg/dL — AB
Leukocytes, UA: NEGATIVE
NITRITE: NEGATIVE
PH: 6 (ref 5.0–8.0)
PROTEIN: 100 mg/dL — AB
Specific Gravity, Urine: 1.042 — ABNORMAL HIGH (ref 1.005–1.030)

## 2017-01-02 LAB — COMPREHENSIVE METABOLIC PANEL
ALK PHOS: 49 U/L (ref 38–126)
ALT: 9 U/L — AB (ref 14–54)
ANION GAP: 12 (ref 5–15)
AST: 14 U/L — ABNORMAL LOW (ref 15–41)
Albumin: 4.5 g/dL (ref 3.5–5.0)
BILIRUBIN TOTAL: 1.1 mg/dL (ref 0.3–1.2)
BUN: 7 mg/dL (ref 6–20)
CALCIUM: 10.1 mg/dL (ref 8.9–10.3)
CO2: 26 mmol/L (ref 22–32)
CREATININE: 0.48 mg/dL (ref 0.44–1.00)
Chloride: 98 mmol/L — ABNORMAL LOW (ref 101–111)
Glucose, Bld: 311 mg/dL — ABNORMAL HIGH (ref 65–99)
Potassium: 3.3 mmol/L — ABNORMAL LOW (ref 3.5–5.1)
Sodium: 136 mmol/L (ref 135–145)
TOTAL PROTEIN: 8.8 g/dL — AB (ref 6.5–8.1)

## 2017-01-02 LAB — CBC
HCT: 42.3 % (ref 35.0–47.0)
HEMOGLOBIN: 14.3 g/dL (ref 12.0–16.0)
MCH: 28.5 pg (ref 26.0–34.0)
MCHC: 33.8 g/dL (ref 32.0–36.0)
MCV: 84.6 fL (ref 80.0–100.0)
PLATELETS: 309 10*3/uL (ref 150–440)
RBC: 5 MIL/uL (ref 3.80–5.20)
RDW: 13.2 % (ref 11.5–14.5)
WBC: 6.8 10*3/uL (ref 3.6–11.0)

## 2017-01-02 LAB — GLUCOSE, CAPILLARY
GLUCOSE-CAPILLARY: 347 mg/dL — AB (ref 65–99)
GLUCOSE-CAPILLARY: 369 mg/dL — AB (ref 65–99)

## 2017-01-02 LAB — POCT PREGNANCY, URINE
Preg Test, Ur: NEGATIVE
Preg Test, Ur: NEGATIVE

## 2017-01-02 LAB — LIPASE, BLOOD: Lipase: 22 U/L (ref 11–51)

## 2017-01-02 MED ORDER — INSULIN ASPART 100 UNIT/ML ~~LOC~~ SOLN
5.0000 [IU] | Freq: Once | SUBCUTANEOUS | Status: AC
  Administered 2017-01-02: 5 [IU] via INTRAVENOUS
  Filled 2017-01-02: qty 1

## 2017-01-02 MED ORDER — HYDROCODONE-ACETAMINOPHEN 5-325 MG PO TABS: 2.0000 | ORAL_TABLET | Freq: Four times a day (QID) | ORAL | 0 refills | Status: DC | PRN

## 2017-01-02 MED ORDER — DIPHENHYDRAMINE HCL 50 MG/ML IJ SOLN
25.0000 mg | Freq: Once | INTRAMUSCULAR | Status: AC
  Administered 2017-01-02: 25 mg via INTRAVENOUS
  Filled 2017-01-02: qty 1

## 2017-01-02 MED ORDER — IOPAMIDOL (ISOVUE-300) INJECTION 61%
100.0000 mL | Freq: Once | INTRAVENOUS | Status: AC | PRN
  Administered 2017-01-02: 100 mL via INTRAVENOUS

## 2017-01-02 MED ORDER — ONDANSETRON 4 MG PO TBDP: 4.0000 mg | ORAL_TABLET | Freq: Once | ORAL | Status: DC | PRN

## 2017-01-02 MED ORDER — DEXTROSE 5 % AND 0.9 % NACL IV BOLUS
1000.0000 mL | Freq: Once | INTRAVENOUS | Status: AC
  Administered 2017-01-02: 1000 mL via INTRAVENOUS
  Filled 2017-01-02: qty 1000

## 2017-01-02 NOTE — ED Notes (Signed)
Dr. Cinda Quest notified of patients CBG and pain. No new orders at this time.

## 2017-01-02 NOTE — ED Notes (Signed)
UA PREG = NEG

## 2017-01-02 NOTE — ED Triage Notes (Signed)
Says she has been sick for 4 days with abd pain-mostly left lower.  Says she has been vomiting.

## 2017-01-02 NOTE — ED Provider Notes (Signed)
patient's CT is negative she still has diffuse abdominal pain lab work looks okay will plan to discharge her as planned with Dr. Joni Fears. We'll give patient a few Vicodin if need be for pain. Patient reports she can take Vicodin does not have any problems with that despite what the allergies lists says   Nena Polio, MD 01/02/17 1756

## 2017-01-02 NOTE — ED Triage Notes (Signed)
alsos says left side neck pain for quite awhile.  Denies fever.

## 2017-01-02 NOTE — ED Provider Notes (Signed)
[facility] Emergency Department Provider Note  ____________________________________________  Time seen: Approximately 2:52 PM  I have reviewed the triage vital signs and the nursing notes.   HISTORY  Chief Complaint Abdominal Pain and Emesis    HPI Rachael Gray is a 36 y.o. female who complains of diffuse left-sided abdominal pain, nonradiating, associated with nausea and vomiting. The vomiting is worse with eating. She also has body aches. No fevers or chills. No aggravating or alleviating factors to the abdominal pain, its nonradiating. Described as moderate intensity aching.  She expected her period to start yesterday, today she is just having small amounts of spotting. She does have a history of endometriosis and multiple abdominal surgeries including cholecystectomy appendectomy right oophorectomy. Denies colicky quality of pain.     Past Medical History:  Diagnosis Date  . Diabetes mellitus without complication ([place])   . Gastroparesis   . Lupus   . Pseudoseizures   . Seizures [facility])      Patient Active Problem List   Diagnosis Date Noted  . Hypokalemia 12/24/2015  . Anemia 12/24/2015  . Diabetes ([place]) 12/24/2015  . Gastroparesis due to DM ([place])   . Acute respiratory alkalosis 12/22/2015  . Duodenitis 12/22/2015  . Vomiting 12/16/2015  . Intractable nausea and vomiting 12/10/2014  . Nausea & vomiting 12/10/2014  . Narcotic abuse ([place]) 12/09/2014  . Sepsis ([place]) 12/07/2014  . Anxiety   . Pyelonephritis 12/04/2014  . Lactic acidosis 12/04/2014    Class: Acute     Past Surgical History:  Procedure Laterality Date  . APPENDECTOMY    . CHOLECYSTECTOMY       Prior to Admission medications   Medication Sig Start Date End Date Taking? Authorizing Provider  Diclofenac Sodium 3 % GEL Place 1 application onto the skin every 12 (twelve) hours as needed. Patient not taking: Reported on 06/09/2016 04/13/16   Orbie Pyo,  MD  feeding supplement (BOOST / RESOURCE BREEZE) LIQD Take 1 Container by mouth 3 (three) times daily between meals. Patient not taking: Reported on 06/09/2016 12/24/15   Theodoro Grist, MD  fluticasone [facility]) 50 MCG/ACT nasal spray Place 2 sprays into both nostrils daily. 12/15/15   Hagler, Jami L, PA-C  insulin NPH-regular Human (NOVOLIN 70/30) (70-30) 100 UNIT/ML injection Inject 22-25 Units into the skin 2 (two) times daily with a meal. 25 units in the morning and 22 units in the evening Patient not taking: Reported on 06/09/2016 12/18/15   Hugelmeyer, Ubaldo Glassing, DO  levETIRAcetam (KEPPRA) 500 MG tablet Take 500 mg by mouth 3 (three) times daily.    [provider]  levETIRAcetam (KEPPRA) 750 MG tablet Take 1 tablet (750 mg total) by mouth 2 (two) times daily. Patient not taking: Reported on 06/09/2016 08/21/15   Paulette Blanch, MD  nortriptyline (PAMELOR) 25 MG capsule Take 1 capsule (25 mg total) by mouth at bedtime. Patient not taking: Reported on 10/17/2016 06/18/16   Dustin Flock, MD  pantoprazole (PROTONIX) 40 MG tablet Take 1 tablet (40 mg total) by mouth daily. 06/18/16   Dustin Flock, MD     Allergies Dilantin [phenytoin sodium extended]; Doxycycline; Other; Morphine and related; and Tylenol [acetaminophen]   Family History  Problem Relation Age of Onset  . Breast cancer Maternal Grandmother     Social History Social History  Substance Use Topics  . Smoking status: Former Smoker    Packs/day: 2.00    Years: 10.00    Types: Cigarettes  . Smokeless tobacco: Never Used  .  Alcohol use No    Review of Systems  Constitutional:   No fever or chills.  ENT:   No sore throat. No rhinorrhea. Cardiovascular:   No chest pain or syncope. Respiratory:   No dyspnea or cough. Gastrointestinal:   positive abdominal pain as above with vomiting. No diarrhea or constipation. Last bowel movement today.  Musculoskeletal:   left-sided neck tightness and pain for the past 2-3  days. All other systems reviewed and are negative except as documented above in ROS and HPI.  ____________________________________________   PHYSICAL EXAM:  VITAL SIGNS: ED Triage Vitals  Enc Vitals Group     BP 01/02/17 1257 113/83     Pulse Rate 01/02/17 1257 (!) 116     Resp 01/02/17 1257 16     Temp 01/02/17 1257 98.2 F (36.8 C)     Temp Source 01/02/17 1257 Oral     SpO2 01/02/17 1257 98 %     Weight 01/02/17 1257 130 lb (59 kg)     Height 01/02/17 1257 5\' 4"  (1.626 m)     Head Circumference --      Peak Flow --      Pain Score 01/02/17 1256 10     Pain Loc --      Pain Edu? --      Excl. in [place]? --     Vital signs reviewed, nursing assessments reviewed.   Constitutional:   Alert and oriented. Well appearing and in no distress. Eyes:   No scleral icterus.  EOMI. No nystagmus. No conjunctival pallor. PERRL. ENT   Head:   Normocephalic and atraumatic.   Nose:   No congestion/rhinnorhea.    Mouth/Throat:   MMM, no pharyngeal erythema. No peritonsillar mass.    Neck:   No meningismus. Full ROM. Hematological/Lymphatic/Immunilogical:   No cervical lymphadenopathy. Cardiovascular:   RRR. Symmetric bilateral radial and DP pulses.  No murmurs.  Respiratory:   Normal respiratory effort without tachypnea/retractions. Breath sounds are clear and equal bilaterally. No wheezes/rales/rhonchi. Gastrointestinal:   Soft with diffuse left-sided tenderness. Non distended. There is no CVA tenderness.  No rebound, rigidity, or guarding. Genitourinary:   deferred Musculoskeletal:   Normal range of motion in all extremities. No joint effusions.  No lower extremity tenderness.  No edema.no midline spinal tenderness. Left neck tender along this trapezius and scalenes with muscle tightness Neurologic:   Normal speech and language.  Motor grossly intact. No gross focal neurologic deficits are appreciated.  Skin:    Skin is warm, dry and intact. No rash noted.  No petechiae,  purpura, or bullae.  ____________________________________________    LABS (pertinent positives/negatives) (all labs ordered are listed, but only abnormal results are displayed) Labs Reviewed  COMPREHENSIVE METABOLIC PANEL - Abnormal; Notable for the following:       Result Value   Potassium 3.3 (*)    Chloride 98 (*)    Glucose, Bld 311 (*)    Total Protein 8.8 (*)    AST 14 (*)    ALT 9 (*)    All other components within normal limits  URINALYSIS, COMPLETE (UACMP) WITH MICROSCOPIC - Abnormal; Notable for the following:    Color, Urine YELLOW (*)    APPearance HAZY (*)    Specific Gravity, Urine 1.042 (*)    Glucose, UA >=500 (*)    Hgb urine dipstick MODERATE (*)    Ketones, ur 20 (*)    Protein, ur 100 (*)    Squamous Epithelial / LPF 0-5 (*)  All other components within normal limits  LIPASE, BLOOD  CBC  POC URINE PREG, ED  POCT PREGNANCY, URINE  POCT PREGNANCY, URINE   ____________________________________________   EKG    ____________________________________________    RADIOLOGY  No results found.  ____________________________________________   PROCEDURES Procedures  ____________________________________________   DIFFERENTIAL DIAGNOSIS  bowel obstruction, diverticulitis, endometriosis related menstrual pain, cyclic vomiting  CLINICAL IMPRESSION / ASSESSMENT AND PLAN / ED COURSE  Pertinent labs & imaging results that were available during my care of the patient were reviewed by me and considered in my medical decision making (see chart for details).   patient presents with left-sided abdominal pain. Overall she is well appearing, not in distress, normal vital signs. With her surgical history and exam, there is a concern for possible underlying obstruction or diverticulitis. Low suspicion for STI PID TOA or torsion. Patient is not pregnant. I get a CT scan of the abdomen and pelvis for further evaluation. Labs do reveal evidence of dehydration and  starvation metabolism on the urinalysis with concentration and ketones.I'll give D5 normal saline for hydration. Benadryl for its symptom benefits related to gastritis and gastroparesis.  Care the patient will be signed out to Dr. Cinda Quest at 3:00 PM to follow-up on CT.      ____________________________________________   FINAL CLINICAL IMPRESSION(S) / ED DIAGNOSES    Final diagnoses:  Generalized abdominal pain  Nausea and vomiting, intractability of vomiting not specified, unspecified vomiting type      New Prescriptions   No medications on file     Portions of this note were generated with dragon dictation software. Dictation errors may occur despite best attempts at proofreading.    Carrie Mew, MD 01/02/17 1500

## 2017-01-02 NOTE — ED Notes (Signed)
Patient transported to CT 

## 2017-01-02 NOTE — ED Notes (Signed)
[facility] called and notified of need of IV fluids. States they will walk them up.

## 2017-01-02 NOTE — Discharge Instructions (Signed)
please return for worse pain fever or vomiting. You can take the Vicodin 1 pill 4 times a day if needed for pain. Follow-up with your regular doctor in the next day or 2.

## 2017-02-22 ENCOUNTER — Emergency Department
Admission: EM | Admit: 2017-02-22 | Discharge: 2017-02-23 | Disposition: A | Discharge status: Home / Self Care | Payer: Medicaid Other | Source: Home / Self Care | Attending: Emergency Medicine | Admitting: Emergency Medicine

## 2017-02-22 ENCOUNTER — Other Ambulatory Visit: Payer: Self-pay

## 2017-02-22 ENCOUNTER — Emergency Department
Admission: EM | Admit: 2017-02-22 | Discharge: 2017-02-22 | Disposition: A | Discharge status: Home / Self Care | Payer: Medicaid Other | Attending: Emergency Medicine | Admitting: Emergency Medicine

## 2017-02-22 ENCOUNTER — Encounter: Payer: Self-pay | Admitting: Emergency Medicine

## 2017-02-22 ENCOUNTER — Emergency Department: Payer: Medicaid Other

## 2017-02-22 DIAGNOSIS — R1010 Upper abdominal pain, unspecified: Secondary | ICD-10-CM | POA: Diagnosis not present

## 2017-02-22 DIAGNOSIS — Z79899 Other long term (current) drug therapy: Secondary | ICD-10-CM | POA: Diagnosis not present

## 2017-02-22 DIAGNOSIS — E1165 Type 2 diabetes mellitus with hyperglycemia: Secondary | ICD-10-CM | POA: Insufficient documentation

## 2017-02-22 DIAGNOSIS — F17210 Nicotine dependence, cigarettes, uncomplicated: Secondary | ICD-10-CM | POA: Diagnosis not present

## 2017-02-22 DIAGNOSIS — R1084 Generalized abdominal pain: Secondary | ICD-10-CM

## 2017-02-22 DIAGNOSIS — R112 Nausea with vomiting, unspecified: Secondary | ICD-10-CM

## 2017-02-22 DIAGNOSIS — R739 Hyperglycemia, unspecified: Secondary | ICD-10-CM

## 2017-02-22 LAB — COMPREHENSIVE METABOLIC PANEL
ALT: 8 U/L — AB (ref 14–54)
AST: 14 U/L — AB (ref 15–41)
Albumin: 4.9 g/dL (ref 3.5–5.0)
Alkaline Phosphatase: 55 U/L (ref 38–126)
Anion gap: 13 (ref 5–15)
BUN: 10 mg/dL (ref 6–20)
CALCIUM: 9.8 mg/dL (ref 8.9–10.3)
CO2: 24 mmol/L (ref 22–32)
CREATININE: 0.63 mg/dL (ref 0.44–1.00)
Chloride: 100 mmol/L — ABNORMAL LOW (ref 101–111)
GFR calc Af Amer: >60 mL/min (ref >60)
GFR calc non Af Amer: >60 mL/min (ref >60)
GLUCOSE: 302 mg/dL — AB (ref 65–99)
Potassium: 3.4 mmol/L — ABNORMAL LOW (ref 3.5–5.1)
SODIUM: 137 mmol/L (ref 135–145)
TOTAL PROTEIN: 8.8 g/dL — AB (ref 6.5–8.1)
Total Bilirubin: 0.6 mg/dL (ref 0.3–1.2)

## 2017-02-22 LAB — BLOOD GAS, VENOUS
ACID-BASE EXCESS: 3.7 mmol/L — AB (ref 0.0–2.0)
Bicarbonate: 29.2 mmol/L — ABNORMAL HIGH (ref 20.0–28.0)
O2 SAT: UNDETERMINED %
PATIENT TEMPERATURE: 37
PCO2 VEN: 46 mmHg (ref 44.0–60.0)
pH, Ven: 7.41 (ref 7.250–7.430)
pO2, Ven: UNDETERMINED mmHg (ref 32.0–45.0)

## 2017-02-22 LAB — URINALYSIS, COMPLETE (UACMP) WITH MICROSCOPIC
Bacteria, UA: NONE SEEN
Bilirubin Urine: NEGATIVE
Glucose, UA: >=500 mg/dL — AB
HGB URINE DIPSTICK: NEGATIVE
Ketones, ur: 5 mg/dL — AB
LEUKOCYTES UA: NEGATIVE
NITRITE: NEGATIVE
PROTEIN: 100 mg/dL — AB
SPECIFIC GRAVITY, URINE: 1.022 (ref 1.005–1.030)
pH: 7 (ref 5.0–8.0)

## 2017-02-22 LAB — GLUCOSE, CAPILLARY
GLUCOSE-CAPILLARY: 306 mg/dL — AB (ref 65–99)
GLUCOSE-CAPILLARY: 258 mg/dL — AB (ref 65–99)
GLUCOSE-CAPILLARY: 136 mg/dL — AB (ref 65–99)
GLUCOSE-CAPILLARY: 227 mg/dL — AB (ref 65–99)

## 2017-02-22 LAB — CBC
HCT: 44.5 % (ref 35.0–47.0)
Hemoglobin: 14.8 g/dL (ref 12.0–16.0)
MCH: 29 pg (ref 26.0–34.0)
MCHC: 33.3 g/dL (ref 32.0–36.0)
MCV: 87 fL (ref 80.0–100.0)
PLATELETS: 234 10*3/uL (ref 150–440)
RBC: 5.12 MIL/uL (ref 3.80–5.20)
RDW: 13.8 % (ref 11.5–14.5)
WBC: 7.7 10*3/uL (ref 3.6–11.0)

## 2017-02-22 LAB — POCT PREGNANCY, URINE: Preg Test, Ur: NEGATIVE

## 2017-02-22 LAB — LIPASE, BLOOD: Lipase: 48 U/L (ref 11–51)

## 2017-02-22 MED ORDER — ONDANSETRON HCL 4 MG/2ML IJ SOLN
4.0000 mg | Freq: Once | INTRAMUSCULAR | Status: AC
  Administered 2017-02-22: 4 mg via INTRAVENOUS

## 2017-02-22 MED ORDER — IOPAMIDOL (ISOVUE-300) INJECTION 61%
100.0000 mL | Freq: Once | INTRAVENOUS | Status: AC | PRN
  Administered 2017-02-22: 100 mL via INTRAVENOUS

## 2017-02-22 MED ORDER — SODIUM CHLORIDE 0.9 % IV BOLUS (SEPSIS)
1000.0000 mL | Freq: Once | INTRAVENOUS | Status: AC
  Administered 2017-02-22: 1000 mL via INTRAVENOUS

## 2017-02-22 MED ORDER — HYDROMORPHONE HCL 1 MG/ML IJ SOLN
0.5000 mg | INTRAMUSCULAR | Status: AC
  Administered 2017-02-22: 0.5 mg via INTRAVENOUS
  Filled 2017-02-22: qty 1

## 2017-02-22 MED ORDER — INSULIN ASPART 100 UNIT/ML ~~LOC~~ SOLN
4.0000 [IU] | Freq: Once | SUBCUTANEOUS | Status: AC
  Administered 2017-02-22: 4 [IU] via INTRAVENOUS
  Filled 2017-02-22: qty 1

## 2017-02-22 MED ORDER — HALOPERIDOL LACTATE 5 MG/ML IJ SOLN
5.0000 mg | Freq: Once | INTRAMUSCULAR | Status: AC
  Administered 2017-02-22: 5 mg via INTRAMUSCULAR

## 2017-02-22 MED ORDER — HYDROMORPHONE HCL 1 MG/ML IJ SOLN
INTRAMUSCULAR | Status: AC
  Administered 2017-02-22: 0.5 mg via INTRAVENOUS
  Filled 2017-02-22: qty 1

## 2017-02-22 MED ORDER — ONDANSETRON HCL 4 MG/2ML IJ SOLN
INTRAMUSCULAR | Status: AC
  Filled 2017-02-22: qty 2

## 2017-02-22 MED ORDER — ONDANSETRON HCL 4 MG/2ML IJ SOLN
INTRAMUSCULAR | Status: AC
  Administered 2017-02-22: 4 mg via INTRAVENOUS
  Filled 2017-02-22: qty 2

## 2017-02-22 MED ORDER — HALOPERIDOL LACTATE 5 MG/ML IJ SOLN
INTRAMUSCULAR | Status: AC
  Administered 2017-02-22: 5 mg via INTRAMUSCULAR
  Filled 2017-02-22: qty 1

## 2017-02-22 MED ORDER — ONDANSETRON 4 MG PO TBDP: 4.0000 mg | ORAL_TABLET | Freq: Four times a day (QID) | ORAL | 0 refills | Status: DC | PRN

## 2017-02-22 MED ORDER — HYDROMORPHONE HCL 1 MG/ML IJ SOLN
1.0000 mg | Freq: Once | INTRAMUSCULAR | Status: AC
  Administered 2017-02-22: 1 mg via INTRAVENOUS

## 2017-02-22 NOTE — Discharge Instructions (Signed)

## 2017-02-22 NOTE — ED Notes (Signed)
Pt returned from CT via stretcher.

## 2017-02-22 NOTE — ED Notes (Signed)
Pt stopped shaking and is laying quietly in bed

## 2017-02-22 NOTE — ED Provider Notes (Signed)
[facility] Emergency Department Provider Note   ____________________________________________   First MD Initiated Contact with Patient 02/22/17 [phone]     (approximate)  I have reviewed the triage vital signs and the nursing notes.   HISTORY  Chief Complaint Hyperglycemia    HPI Rachael Gray is a 36 y.o. female the history of diabetes, currently on insulin therapy.  Patient reports has been using her insulin but despite that has had several high blood sugars over the last week with blood sugars running in the "300s" throughout the week.  Reports she began experiencing a slight cough about a week ago without any shortness of breath and that seems to have resolved.  No runny nose.  No fevers.  No ongoing cough.  She does however report that a couple days ago she started feeling very dehydrated, dry in the mouth, and began feeling nausea with some occasional vomiting and also discomfort throughout her upper abdomen.  She reports ongoing discomfort with nausea worsening and a feeling like she is very dry and dehydrated.  Her blood sugars have continued to be elevated.  Denies pregnancy, last menstrual period was about a month ago.  No chest pain or trouble breathing.   Past Medical History:  Diagnosis Date  . Diabetes mellitus without complication ([place])   . Gastroparesis   . Lupus   . Pseudoseizures   . Seizures [facility])     Patient Active Problem List   Diagnosis Date Noted  . Hypokalemia 12/24/2015  . Anemia 12/24/2015  . Diabetes ([place]) 12/24/2015  . Gastroparesis due to DM ([place])   . Acute respiratory alkalosis 12/22/2015  . Duodenitis 12/22/2015  . Vomiting 12/16/2015  . Intractable nausea and vomiting 12/10/2014  . Nausea & vomiting 12/10/2014  . Narcotic abuse ([place]) 12/09/2014  . Sepsis ([place]) 12/07/2014  . Anxiety   . Pyelonephritis 12/04/2014  . Lactic acidosis 12/04/2014    Class: Acute    Past Surgical History:  Procedure Laterality  Date  . APPENDECTOMY    . CHOLECYSTECTOMY      Prior to Admission medications   Medication Sig Start Date End Date Taking? Authorizing Provider  Diclofenac Sodium 3 % GEL Place 1 application onto the skin every 12 (twelve) hours as needed. Patient not taking: Reported on 06/09/2016 04/13/16   Orbie Pyo, MD  feeding supplement (BOOST / RESOURCE BREEZE) LIQD Take 1 Container by mouth 3 (three) times daily between meals. Patient not taking: Reported on 06/09/2016 12/24/15   Theodoro Grist, MD  fluticasone [facility]) 50 MCG/ACT nasal spray Place 2 sprays into both nostrils daily. 12/15/15   Hagler, Jami L, PA-C  HYDROcodone-acetaminophen (NORCO) 5-325 MG tablet Take 2 tablets by mouth every 6 (six) hours as needed for moderate pain. 01/02/17   Nena Polio, MD  insulin NPH-regular Human (NOVOLIN 70/30) (70-30) 100 UNIT/ML injection Inject 22-25 Units into the skin 2 (two) times daily with a meal. 25 units in the morning and 22 units in the evening Patient not taking: Reported on 06/09/2016 12/18/15   Hugelmeyer, Ubaldo Glassing, DO  levETIRAcetam (KEPPRA) 500 MG tablet Take 500 mg by mouth 3 (three) times daily.    [provider]  levETIRAcetam (KEPPRA) 750 MG tablet Take 1 tablet (750 mg total) by mouth 2 (two) times daily. Patient not taking: Reported on 06/09/2016 08/21/15   Paulette Blanch, MD  nortriptyline (PAMELOR) 25 MG capsule Take 1 capsule (25 mg total) by mouth at bedtime. Patient not taking: Reported on  10/17/2016 06/18/16   Dustin Flock, MD  pantoprazole (PROTONIX) 40 MG tablet Take 1 tablet (40 mg total) by mouth daily. 06/18/16   Dustin Flock, MD    Allergies Dilantin [phenytoin sodium extended]; Doxycycline; Other; Morphine and related; and Tylenol [acetaminophen]  Family History  Problem Relation Age of Onset  . Breast cancer Maternal Grandmother     Social History Social History   Tobacco Use  . Smoking status: Current Every Day Smoker    Packs/day: 0.50     Years: 10.00    Pack years: 5.00    Types: Cigarettes  . Smokeless tobacco: Never Used  Substance Use Topics  . Alcohol use: No  . Drug use: Yes    Types: Marijuana    Comment: 3 months ago.     Review of Systems Constitutional: No fever/chills feels very lightheaded and has been "sweating" the last day Eyes: No visual changes. ENT: No sore throat. Cardiovascular: Denies chest pain. Respiratory: Denies shortness of breath. Gastrointestinal:   No diarrhea.  No constipation. Genitourinary: Negative for dysuria. Musculoskeletal: Negative for back pain. Skin: Negative for rash. Neurological: Negative for headaches, focal weakness or numbness.    ____________________________________________   PHYSICAL EXAM:  VITAL SIGNS: ED Triage Vitals  Enc Vitals Group     BP 02/22/17 0858 (!) 190/89     Pulse Rate 02/22/17 0858 (!) 132     Resp 02/22/17 0858 20     Temp 02/22/17 0858 98.7 F (37.1 C)     Temp Source 02/22/17 0858 Oral     SpO2 02/22/17 0858 98 %     Weight 02/22/17 0859 120 lb (54.4 kg)     Height 02/22/17 0859 5\' 5"  (1.651 m)     Head Circumference --      Peak Flow --      Pain Score 02/22/17 0858 10     Pain Loc --      Pain Edu? --      Excl. in [place]? --     Constitutional: Alert and oriented.  Moderately ill-appearing in no distress.  She is diaphoretic nauseated and is vomiting Eyes: Conjunctivae are normal. Head: Atraumatic. Nose: No congestion/rhinnorhea. Mouth/Throat: Mucous membranes are dry Neck: No stridor.   Cardiovascular: Normal rate, regular rhythm. Grossly normal heart sounds.  Good peripheral circulation. Respiratory: Normal respiratory effort.  No retractions. Lungs CTAB. Gastrointestinal: Soft and nontender in the lower abdomen, but moderate tenderness to palpation throughout the upper abdomen especially over the epigastrium left upper quadrant without rebound or guarding. No distention. Musculoskeletal: No lower extremity tenderness nor  edema. Neurologic:  Normal speech and language. No gross focal neurologic deficits are appreciated.  Skin:  Skin is warm, diaphoretic and intact. No rash noted. Psychiatric: Mood and affect are norma slightly anxious, reports she is anxious pieces her daughter's birthday today l. Speech and behavior are normal.  ____________________________________________   LABS (all labs ordered are listed, but only abnormal results are displayed)  Labs Reviewed  COMPREHENSIVE METABOLIC PANEL  LIPASE, BLOOD  BLOOD GAS, VENOUS  CBC  URINALYSIS, COMPLETE (UACMP) WITH MICROSCOPIC  CBG MONITORING, ED  POC URINE PREG, ED   ____________________________________________  EKG  Reviewed and interpreted by me at 10:05 AM Heart rate 100 Curious 500 Normal sinus rhythm, some baseline wander, no evidence of ischemia is noted.  Minimal prolongation of QT interval ____________________________________________  RADIOLOGY  Ct Abdomen Pelvis W Contrast  Result Date: 02/22/2017 CLINICAL DATA:  Nausea, vomiting. EXAM: CT ABDOMEN AND PELVIS  WITH CONTRAST TECHNIQUE: Multidetector CT imaging of the abdomen and pelvis was performed using the standard protocol following bolus administration of intravenous contrast. CONTRAST:  179mL ISOVUE-300 IOPAMIDOL (ISOVUE-300) INJECTION 61% COMPARISON:  01/02/2017 FINDINGS: Lower chest: Lung bases are clear. No effusions. Heart is normal size. Hepatobiliary: No focal liver abnormality is seen. Status post cholecystectomy. No biliary dilatation. Pancreas: No focal abnormality or ductal dilatation. Spleen: No focal abnormality.  Normal size. Adrenals/Urinary Tract: No adrenal abnormality. No focal renal abnormality. No stones or hydronephrosis. Urinary bladder is unremarkable. Stomach/Bowel: Stomach, large and small bowel grossly unremarkable. Vascular/Lymphatic: Scattered aortic and iliac calcifications. No evidence of aneurysm or adenopathy. Reproductive: Uterus and adnexa unremarkable.  No mass. Collapsing left ovarian follicle noted. Other: Small amount of free fluid in the pelvis.  No free air. Musculoskeletal: No acute bony abnormality or focal bone lesion. IMPRESSION: No acute findings in the abdomen or pelvis. Electronically Signed   By: Rolm Baptise M.D.   On: 02/22/2017 11:39    CT results reviewed, no acute ____________________________________________   PROCEDURES  Procedure(s) performed: None  Procedures  Critical Care performed: No  ____________________________________________   INITIAL IMPRESSION / ASSESSMENT AND PLAN / ED COURSE  Pertinent labs & imaging results that were available during my care of the patient were reviewed by me and considered in my medical decision making (see chart for details).  Patient presents for evaluation of hyperglycemia with nausea vomiting abdominal pain.  Certainly wish to exclude DKA given her recently elevated blood sugars, she does report compliance with insulin therapy and regular blood glucose checks in the 300s for a week.  She denies any acute infectious symptoms at this time, but did have upper respiratory type symptoms about a week ago that seemed to have resolved.  She does have tenderness throughout the upper abdomen left upper quadrant, etiology not yet clear but I will check labs though she has no evidence of acute surgical abdomen and no peritonitis is noted.   Plan to initiate IV fluids, notably tachycardic and diaphoretic.  No chest pain or trouble breathing.  No neurologic symptoms.  Await urinalysis, urine pregnancy.   Clinical Course as of Feb 23 1408  Sat Feb 22, 2017  1344 No ongoing emesis.  Patient reports pain improved.  Currently resting comfortably in no distress.  Was given insulin, will recheck her glucose now and plan to discharge her with prescription for Zofran and discussed need for careful insulin compliance and primary follow-up.  Patient is agreeable, careful return precautions discussed with  her and her fianc.  [MQ]    Clinical Course User Index [MQ] Delman Kitten, MD    ----------------------------------------- 2:09 PM on 02/22/2017 -----------------------------------------  Blood sugar normalized.  Patient resting comfortably.  Reports all nausea and pain have improved.  She is feeling much better.  Appears appropriate for ongoing outpatient care, discussed insulin compliance and careful return precautions. ____________________________________________   FINAL CLINICAL IMPRESSION(S) / ED DIAGNOSES  Final diagnoses:  None      NEW MEDICATIONS STARTED DURING THIS VISIT:  This SmartLink is deprecated. Use AVSMEDLIST instead to display the medication list for a patient.   Note:  This document was prepared using Dragon voice recognition software and may include unintentional dictation errors.     Delman Kitten, MD 02/22/17 1410

## 2017-02-22 NOTE — ED Triage Notes (Signed)
States bleed sugars running 300s all week. States has had cough x 1 week. States nausea and vomiting began yesterday.

## 2017-02-22 NOTE — ED Provider Notes (Signed)
[facility] Emergency Department Provider Note  ____________________________________________  Time seen: Approximately 11:46 PM  I have reviewed the triage vital signs and the nursing notes.   HISTORY  Chief Complaint Seizures  Level 5 Caveat: Portions of the History and Physical are unable to be obtained due to patient being a poor historian   HPI Rachael Gray is a 36 y.o. female comes to the ED complaining that she's having a seizure. She is rocking in large rhythmic rolling motion on her side and groaning and crying.  She complains of generalized abdominal pain and nausea. She says she feels like she would vomit. No diarrhea or constipation. No trauma. No fever or chills chest pain or shortness of breath.  Does have a history of diabetes and difficulty with managing her insulin regimen. She was seen in the ED earlier today had an extensive workup which was negative.   Past Medical History:  Diagnosis Date  . Diabetes mellitus without complication ([place])   . Gastroparesis   . Lupus   . Pseudoseizures   . Seizures [facility])      Patient Active Problem List   Diagnosis Date Noted  . Hypokalemia 12/24/2015  . Anemia 12/24/2015  . Diabetes (Rachael Gray) 12/24/2015  . Gastroparesis due to DM ([place])   . Acute respiratory alkalosis 12/22/2015  . Duodenitis 12/22/2015  . Vomiting 12/16/2015  . Intractable nausea and vomiting 12/10/2014  . Nausea & vomiting 12/10/2014  . Narcotic abuse ([place]) 12/09/2014  . Sepsis ([place]) 12/07/2014  . Anxiety   . Pyelonephritis 12/04/2014  . Lactic acidosis 12/04/2014    Class: Acute     Past Surgical History:  Procedure Laterality Date  . APPENDECTOMY    . CHOLECYSTECTOMY       Prior to Admission medications   Medication Sig Start Date End Date Taking? Authorizing Provider  [employer] SENNA 8.6 MG tablet Take 2 tablets by mouth at bedtime as needed for constipation. 11/25/16  Yes [provider]  hydrOXYzine  (ATARAX/VISTARIL) 50 MG tablet Take 50 mg by mouth every 6 (six) hours. May take 2 additional doses as needed for anxiety. 12/03/16  Yes [provider]  ondansetron (ZOFRAN ODT) 4 MG disintegrating tablet Take 1 tablet (4 mg total) by mouth every 6 (six) hours as needed for nausea or vomiting. 02/22/17  Yes Delman Kitten, MD  Diclofenac Sodium 3 % GEL Place 1 application onto the skin every 12 (twelve) hours as needed. Patient not taking: Reported on 06/09/2016 04/13/16   Orbie Pyo, MD  DULoxetine (CYMBALTA) 30 MG capsule Take 30 mg by mouth daily. 11/25/16   [provider]  feeding supplement (BOOST / RESOURCE BREEZE) LIQD Take 1 Container by mouth 3 (three) times daily between meals. Patient not taking: Reported on 06/09/2016 12/24/15   Theodoro Grist, MD  fluticasone [facility]) 50 MCG/ACT nasal spray Place 2 sprays into both nostrils daily. Patient not taking: Reported on 02/22/2017 12/15/15   Hagler, Jami L, PA-C  HYDROcodone-acetaminophen (NORCO) 5-325 MG tablet Take 2 tablets by mouth every 6 (six) hours as needed for moderate pain. Patient not taking: Reported on 02/22/2017 01/02/17   Nena Polio, MD  insulin NPH-regular Human (NOVOLIN 70/30) (70-30) 100 UNIT/ML injection Inject 22-25 Units into the skin 2 (two) times daily with a meal. 25 units in the morning and 22 units in the evening Patient not taking: Reported on 06/09/2016 12/18/15   Hugelmeyer, Ubaldo Glassing, DO  levETIRAcetam (KEPPRA) 500 MG tablet Take 500 mg by  mouth 3 (three) times daily.    [provider]  levETIRAcetam (KEPPRA) 750 MG tablet Take 1 tablet (750 mg total) by mouth 2 (two) times daily. Patient not taking: Reported on 06/09/2016 08/21/15   Paulette Blanch, MD  metFORMIN (GLUCOPHAGE) 500 MG tablet Take 500 mg by mouth daily. 11/25/16   [provider]  nortriptyline (PAMELOR) 25 MG capsule Take 1 capsule (25 mg total) by mouth at bedtime. Patient not taking: Reported on 10/17/2016 06/18/16    Dustin Flock, MD  pantoprazole (PROTONIX) 40 MG tablet Take 1 tablet (40 mg total) by mouth daily. Patient not taking: Reported on 02/22/2017 06/18/16   Dustin Flock, MD  traZODone (DESYREL) 50 MG tablet Take 50 mg by mouth at bedtime. 12/03/16   [provider]     Allergies Dilantin [phenytoin sodium extended]; Doxycycline; Other; Morphine and related; and Tylenol [acetaminophen]   Family History  Problem Relation Age of Onset  . Breast cancer Maternal Grandmother     Social History Social History   Tobacco Use  . Smoking status: Current Every Day Smoker    Packs/day: 0.50    Years: 10.00    Pack years: 5.00    Types: Cigarettes  . Smokeless tobacco: Never Used  Substance Use Topics  . Alcohol use: No  . Drug use: Yes    Types: Marijuana    Comment: 3 months ago.     Review of Systems  Constitutional:   No fever or chills.  ENT:   No sore throat. No rhinorrhea. Cardiovascular:   No chest pain or syncope. Respiratory:   No dyspnea or cough. Gastrointestinal:   Positive abdominal pain and vomiting. No diarrhea. No constipation. Musculoskeletal:   Negative for focal pain or swelling All other systems reviewed and are negative except as documented above in ROS and HPI.  ____________________________________________   PHYSICAL EXAM:  VITAL SIGNS: ED Triage Vitals  Enc Vitals Group     BP 02/22/17 2248 (!) 142/75     Pulse Rate 02/22/17 2229 72     Resp 02/22/17 2229 16     Temp --      Temp src --      SpO2 02/22/17 2229 100 %     Weight 02/22/17 2229 120 lb (54.4 kg)     Height 02/22/17 2229 5\' 5"  (1.651 m)     Head Circumference --      Peak Flow --      Pain Score 02/22/17 2228 10     Pain Loc --      Pain Edu? --      Excl. in [place]? --     Vital signs reviewed, nursing assessments reviewed.   Constitutional:   Alert and oriented. Emotionally distraught, but not in distress. Eyes:   No scleral icterus.  EOMI. No nystagmus. No  conjunctival pallor. PERRL. ENT   Head:   Normocephalic and atraumatic.   Nose:   No congestion/rhinnorhea.    Mouth/Throat:   MMM, no pharyngeal erythema. No peritonsillar mass.    Neck:   No meningismus. Full ROM. Hematological/Lymphatic/Immunilogical:   No cervical lymphadenopathy. Cardiovascular:   RRR. Symmetric bilateral radial and DP pulses.  No murmurs.  Respiratory:   Normal respiratory effort without tachypnea/retractions. Breath sounds are clear and equal bilaterally. No wheezes/rales/rhonchi. Gastrointestinal:   Soft and nontender. Non distended. There is no CVA tenderness.  No rebound, rigidity, or guarding. Genitourinary:   deferred Musculoskeletal:   Normal range of motion in all  extremities. No joint effusions.  No lower extremity tenderness.  No edema. Neurologic:   Normal speech and language. Following commands, answering questions appropriately  Motor grossly intact. No gross focal neurologic deficits are appreciated.  Skin:    Skin is warm, dry and intact. No rash noted.  No petechiae, purpura, or bullae.  ____________________________________________    LABS (pertinent positives/negatives) (all labs ordered are listed, but only abnormal results are displayed) Labs Reviewed  GLUCOSE, CAPILLARY - Abnormal; Notable for the following components:      Result Value   Glucose-Capillary 227 (*)    All other components within normal limits  CBG MONITORING, ED   ____________________________________________   EKG    ____________________________________________    RADIOLOGY  Ct Abdomen Pelvis W Contrast  Result Date: 02/22/2017 CLINICAL DATA:  Nausea, vomiting. EXAM: CT ABDOMEN AND PELVIS WITH CONTRAST TECHNIQUE: Multidetector CT imaging of the abdomen and pelvis was performed using the standard protocol following bolus administration of intravenous contrast. CONTRAST:  17mL ISOVUE-300 IOPAMIDOL (ISOVUE-300) INJECTION 61% COMPARISON:  01/02/2017  FINDINGS: Lower chest: Lung bases are clear. No effusions. Heart is normal size. Hepatobiliary: No focal liver abnormality is seen. Status post cholecystectomy. No biliary dilatation. Pancreas: No focal abnormality or ductal dilatation. Spleen: No focal abnormality.  Normal size. Adrenals/Urinary Tract: No adrenal abnormality. No focal renal abnormality. No stones or hydronephrosis. Urinary bladder is unremarkable. Stomach/Bowel: Stomach, large and small bowel grossly unremarkable. Vascular/Lymphatic: Scattered aortic and iliac calcifications. No evidence of aneurysm or adenopathy. Reproductive: Uterus and adnexa unremarkable. No mass. Collapsing left ovarian follicle noted. Other: Small amount of free fluid in the pelvis.  No free air. Musculoskeletal: No acute bony abnormality or focal bone lesion. IMPRESSION: No acute findings in the abdomen or pelvis. Electronically Signed   By: Rolm Baptise M.D.   On: 02/22/2017 11:39    ____________________________________________   PROCEDURES Procedures  ____________________________________________     CLINICAL IMPRESSION / ASSESSMENT AND PLAN / ED COURSE  Pertinent labs & imaging results that were available during my care of the patient were reviewed by me and considered in my medical decision making (see chart for details).   Patient well-appearing no acute distress. Patient states she is having a seizure, but she is awake and alert, following commands, answering questions appropriately, participating in physical exam. She complains of generalized abdominal pain. She had an extensive workup for the same symptoms earlier today including labs and a CT scan of the abdomen and pelvis. This is all unremarkable. Patient's not pregnant. Fingerstick shows that her blood sugar is in the 200s. Patient was given Haldol for antiemetic effect, with resolution of her symptoms. Tolerating oral intake. Discharge home, follow-up with primary care.       ____________________________________________   FINAL CLINICAL IMPRESSION(S) / ED DIAGNOSES    Final diagnoses:  Generalized abdominal pain      This SmartLink is deprecated. Use AVSMEDLIST instead to display the medication list for a patient.   Portions of this note were generated with dragon dictation software. Dictation errors may occur despite best attempts at proofreading.    Carrie Mew, MD 02/22/17 2350

## 2017-02-22 NOTE — ED Notes (Signed)
Pt taken to CT via stretcher.

## 2017-02-22 NOTE — ED Triage Notes (Signed)
Pt moving and talking stating she is having a seizure and is nauseated.

## 2017-02-24 ENCOUNTER — Emergency Department
Admission: EM | Admit: 2017-02-24 | Discharge: 2017-02-25 | Disposition: A | Discharge status: Home / Self Care | Payer: Medicaid Other | Attending: Emergency Medicine | Admitting: Emergency Medicine

## 2017-02-24 ENCOUNTER — Ambulatory Visit: Payer: Self-pay | Admitting: Family Medicine

## 2017-02-24 ENCOUNTER — Other Ambulatory Visit: Payer: Self-pay

## 2017-02-24 ENCOUNTER — Encounter: Payer: Self-pay | Admitting: Emergency Medicine

## 2017-02-24 DIAGNOSIS — R1084 Generalized abdominal pain: Secondary | ICD-10-CM | POA: Diagnosis present

## 2017-02-24 DIAGNOSIS — F17210 Nicotine dependence, cigarettes, uncomplicated: Secondary | ICD-10-CM | POA: Diagnosis not present

## 2017-02-24 DIAGNOSIS — J029 Acute pharyngitis, unspecified: Secondary | ICD-10-CM | POA: Insufficient documentation

## 2017-02-24 DIAGNOSIS — E119 Type 2 diabetes mellitus without complications: Secondary | ICD-10-CM | POA: Insufficient documentation

## 2017-02-24 DIAGNOSIS — Z79899 Other long term (current) drug therapy: Secondary | ICD-10-CM | POA: Insufficient documentation

## 2017-02-24 DIAGNOSIS — Z794 Long term (current) use of insulin: Secondary | ICD-10-CM | POA: Insufficient documentation

## 2017-02-24 LAB — GLUCOSE, CAPILLARY: GLUCOSE-CAPILLARY: 230 mg/dL — AB (ref 65–99)

## 2017-02-24 NOTE — ED Notes (Addendum)
Pt ambulatory to ED lobby with jerking motions of arms; pt accomp by female who st pt is having seizure; pt was seen at [facility] today--see chart notation regarding hx of multiple visits for same with no dx (hx pseudoseizures, chronic pain, cannabinoid hyperemesis)

## 2017-02-24 NOTE — ED Notes (Signed)
Pt not found in subwait or lobby

## 2017-02-24 NOTE — ED Notes (Addendum)
Pt noted sitting in recliner in subwait resting quietly with no distress noted, no shaking noted; pt inquires over wait time & updated on such

## 2017-02-24 NOTE — ED Triage Notes (Signed)
Patient to ER for c/o "shaking". Patient sitting somewhat still when called for triage, shaking increased in triage room. 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 Patient was seen at [facility] for the same complaint today (left at approx 1800 from [facility]). Husband states patient wanted to come here after being released from [facility] ER when asked reason for visit. Patient states she wants to be admitted here to determine cause of shaking. Has appt with neurologist tomorrow. Patient speaking in complete sentences while shaking.

## 2017-02-25 ENCOUNTER — Encounter: Payer: Self-pay | Admitting: Family Medicine

## 2017-02-25 ENCOUNTER — Other Ambulatory Visit: Payer: Self-pay

## 2017-02-25 ENCOUNTER — Emergency Department
Admission: EM | Admit: 2017-02-25 | Discharge: 2017-02-25 | Disposition: A | Discharge status: Home / Self Care | Payer: Medicaid Other | Source: Home / Self Care | Attending: Emergency Medicine | Admitting: Emergency Medicine

## 2017-02-25 DIAGNOSIS — R1115 Cyclical vomiting syndrome unrelated to migraine: Secondary | ICD-10-CM

## 2017-02-25 DIAGNOSIS — R1084 Generalized abdominal pain: Secondary | ICD-10-CM

## 2017-02-25 LAB — COMPREHENSIVE METABOLIC PANEL
ALT: 11 U/L — ABNORMAL LOW (ref 14–54)
ANION GAP: 19 — AB (ref 5–15)
AST: 27 U/L (ref 15–41)
Albumin: 4.8 g/dL (ref 3.5–5.0)
Alkaline Phosphatase: 51 U/L (ref 38–126)
BUN: 7 mg/dL (ref 6–20)
CHLORIDE: 100 mmol/L — AB (ref 101–111)
CO2: 18 mmol/L — ABNORMAL LOW (ref 22–32)
Calcium: 9.9 mg/dL (ref 8.9–10.3)
Creatinine, Ser: 0.52 mg/dL (ref 0.44–1.00)
GFR calc Af Amer: >60 mL/min (ref >60)
Glucose, Bld: 169 mg/dL — ABNORMAL HIGH (ref 65–99)
POTASSIUM: 3 mmol/L — AB (ref 3.5–5.1)
Sodium: 137 mmol/L (ref 135–145)
TOTAL PROTEIN: 8.5 g/dL — AB (ref 6.5–8.1)
Total Bilirubin: 1.2 mg/dL (ref 0.3–1.2)

## 2017-02-25 LAB — LIPASE, BLOOD: LIPASE: 21 U/L (ref 11–51)

## 2017-02-25 LAB — CBC
HEMATOCRIT: 42.3 % (ref 35.0–47.0)
HEMOGLOBIN: 13.9 g/dL (ref 12.0–16.0)
MCH: 28.1 pg (ref 26.0–34.0)
MCHC: 32.8 g/dL (ref 32.0–36.0)
MCV: 85.9 fL (ref 80.0–100.0)
PLATELETS: 204 10*3/uL (ref 150–440)
RBC: 4.93 MIL/uL (ref 3.80–5.20)
RDW: 13.3 % (ref 11.5–14.5)
WBC: 12.3 10*3/uL — ABNORMAL HIGH (ref 3.6–11.0)

## 2017-02-25 MED ORDER — KETOROLAC TROMETHAMINE 30 MG/ML IJ SOLN
30.0000 mg | Freq: Once | INTRAMUSCULAR | Status: AC
Start: 2017-02-25 — End: 2017-02-25
  Administered 2017-02-25: 30 mg via INTRAVENOUS
  Filled 2017-02-25: qty 1

## 2017-02-25 MED ORDER — METOCLOPRAMIDE HCL 10 MG PO TABS: 10.0000 mg | ORAL_TABLET | Freq: Three times a day (TID) | ORAL | 0 refills | Status: DC | PRN

## 2017-02-25 MED ORDER — SODIUM CHLORIDE 0.9 % IV BOLUS (SEPSIS)
1000.0000 mL | Freq: Once | INTRAVENOUS | Status: AC
  Administered 2017-02-25: 1000 mL via INTRAVENOUS

## 2017-02-25 MED ORDER — HALOPERIDOL LACTATE 5 MG/ML IJ SOLN
2.5000 mg | Freq: Once | INTRAMUSCULAR | Status: AC
  Administered 2017-02-25: 2.5 mg via INTRAVENOUS
  Filled 2017-02-25: qty 1

## 2017-02-25 MED ORDER — METOCLOPRAMIDE HCL 5 MG/ML IJ SOLN
10.0000 mg | Freq: Once | INTRAMUSCULAR | Status: AC
  Administered 2017-02-25: 10 mg via INTRAVENOUS
  Filled 2017-02-25: qty 2

## 2017-02-25 NOTE — ED Notes (Signed)
Pt up to toilet without difficulty.

## 2017-02-25 NOTE — ED Triage Notes (Signed)
Pt comes via [facility] with c/o vomiting. Per EMS pt has vomited clear fluid 1X. Respirations even and unlabored. Pt states pain 10/10. Pt previously checked into ED, but left without being seen.

## 2017-02-25 NOTE — ED Notes (Signed)
Pt given ice chips. [place] per MD

## 2017-02-25 NOTE — ED Provider Notes (Signed)
[facility] Emergency Department Provider Note   ____________________________________________   First MD Initiated Contact with Patient 02/25/17 0159     (approximate)  I have reviewed the triage vital signs and the nursing notes.   HISTORY  Chief Complaint Emesis    HPI Rachael Gray is a 36 y.o. female who comes into the hospital today with some abdominal pain and throat burning.  She reports that she is also been shaking.  She reports that this is been going on all week.  She was here on Thursday and states that she was given hydromorphone for pain by Dr. Jacqualine Code.  The patient reports that it helped initially and she went home but the pain returned.  She reports that she has a doctor's appointment tomorrow but she cannot tolerate the pain.  She reports that the only thing that helps with her pain is hydromorphone.  She states that she has not been taking anything but ondansetron at home.  She reports that she has been vomiting but could not tell me what the vomit looked like.  She states that it was clear.  She states that the pain was unbearable tonight so she decided to come in.  The patient rates her pain an 11 out of 10 in intensity.  She has no chest pain or shortness of breath.  She has had no fevers.   Past Medical History:  Diagnosis Date  . Diabetes mellitus without complication ([place])   . Gastroparesis   . Lupus   . Pseudoseizures   . Seizures [facility])     Patient Active Problem List   Diagnosis Date Noted  . Hypokalemia 12/24/2015  . Anemia 12/24/2015  . Diabetes ([place]) 12/24/2015  . Gastroparesis due to DM ([place])   . Acute respiratory alkalosis 12/22/2015  . Duodenitis 12/22/2015  . Vomiting 12/16/2015  . Intractable nausea and vomiting 12/10/2014  . Nausea & vomiting 12/10/2014  . Narcotic abuse ([place]) 12/09/2014  . Sepsis ([place]) 12/07/2014  . Anxiety   . Pyelonephritis 12/04/2014  . Lactic acidosis 12/04/2014    Class: Acute     Past Surgical History:  Procedure Laterality Date  . APPENDECTOMY    . CHOLECYSTECTOMY      Prior to Admission medications   Medication Sig Start Date End Date Taking? Authorizing Provider  [employer] SENNA 8.6 MG tablet Take 2 tablets by mouth at bedtime as needed for constipation. 11/25/16   [provider]  Diclofenac Sodium 3 % GEL Place 1 application onto the skin every 12 (twelve) hours as needed. Patient not taking: Reported on 06/09/2016 04/13/16   Orbie Pyo, MD  DULoxetine (CYMBALTA) 30 MG capsule Take 30 mg by mouth daily. 11/25/16   [provider]  feeding supplement (BOOST / RESOURCE BREEZE) LIQD Take 1 Container by mouth 3 (three) times daily between meals. Patient not taking: Reported on 06/09/2016 12/24/15   Theodoro Grist, MD  fluticasone [facility]) 50 MCG/ACT nasal spray Place 2 sprays into both nostrils daily. Patient not taking: Reported on 02/22/2017 12/15/15   Hagler, Jami L, PA-C  HYDROcodone-acetaminophen (NORCO) 5-325 MG tablet Take 2 tablets by mouth every 6 (six) hours as needed for moderate pain. Patient not taking: Reported on 02/22/2017 01/02/17   Nena Polio, MD  hydrOXYzine (ATARAX/VISTARIL) 50 MG tablet Take 50 mg by mouth every 6 (six) hours. May take 2 additional doses as needed for anxiety. 12/03/16   [provider]  insulin NPH-regular Human (NOVOLIN 70/30) (70-30) 100  UNIT/ML injection Inject 22-25 Units into the skin 2 (two) times daily with a meal. 25 units in the morning and 22 units in the evening Patient not taking: Reported on 06/09/2016 12/18/15   Hugelmeyer, Ubaldo Glassing, DO  levETIRAcetam (KEPPRA) 500 MG tablet Take 500 mg by mouth 3 (three) times daily.    [provider]  levETIRAcetam (KEPPRA) 750 MG tablet Take 1 tablet (750 mg total) by mouth 2 (two) times daily. Patient not taking: Reported on 06/09/2016 08/21/15   Paulette Blanch, MD  metFORMIN (GLUCOPHAGE) 500 MG tablet Take 500 mg by mouth daily. 11/25/16    [provider]  metoCLOPramide (REGLAN) 10 MG tablet Take 1 tablet (10 mg total) by mouth every 8 (eight) hours as needed. 02/25/17   Loney Hering, MD  nortriptyline (PAMELOR) 25 MG capsule Take 1 capsule (25 mg total) by mouth at bedtime. Patient not taking: Reported on 10/17/2016 06/18/16   Dustin Flock, MD  ondansetron (ZOFRAN ODT) 4 MG disintegrating tablet Take 1 tablet (4 mg total) by mouth every 6 (six) hours as needed for nausea or vomiting. 02/22/17   Delman Kitten, MD  pantoprazole (PROTONIX) 40 MG tablet Take 1 tablet (40 mg total) by mouth daily. Patient not taking: Reported on 02/22/2017 06/18/16   Dustin Flock, MD  traZODone (DESYREL) 50 MG tablet Take 50 mg by mouth at bedtime. 12/03/16   [provider]    Allergies Dilantin [phenytoin sodium extended]; Doxycycline; Other; Morphine and related; and Tylenol [acetaminophen]  Family History  Problem Relation Age of Onset  . Breast cancer Maternal Grandmother     Social History Social History   Tobacco Use  . Smoking status: Current Every Day Smoker    Packs/day: 0.50    Years: 10.00    Pack years: 5.00    Types: Cigarettes  . Smokeless tobacco: Never Used  Substance Use Topics  . Alcohol use: No  . Drug use: Yes    Types: Marijuana    Comment: 3 months ago.     Review of Systems  Constitutional: No fever/chills Eyes: No visual changes. ENT:  sore throat. Cardiovascular: Denies chest pain. Respiratory: Denies shortness of breath. Gastrointestinal:  abdominal pain.   nausea,  vomiting.  No diarrhea.  No constipation. Genitourinary: Negative for dysuria. Musculoskeletal: Negative for back pain. Skin: Negative for rash. Neurological: Negative for headaches, focal weakness or numbness.   ____________________________________________   PHYSICAL EXAM:  VITAL SIGNS: ED Triage Vitals  Enc Vitals Group     BP 02/25/17 0056 (!) 144/103     Pulse Rate 02/25/17 0056 (!) 136     Resp  02/25/17 0056 20     Temp 02/25/17 0056 98.4 F (36.9 C)     Temp Source 02/25/17 0056 Oral     SpO2 02/25/17 0056 99 %     Weight 02/25/17 0057 120 lb (54.4 kg)     Height 02/25/17 0057 5\' 5"  (1.651 m)     Head Circumference --      Peak Flow --      Pain Score 02/25/17 0056 10     Pain Loc --      Pain Edu? --      Excl. in [place]? --     Constitutional: Alert and oriented. Well appearing and in moderate distress. Eyes: Conjunctivae are normal. PERRL. EOMI. Head: Atraumatic. Nose: No congestion/rhinnorhea. Mouth/Throat: Mucous membranes are moist.  Oropharynx non-erythematous. Cardiovascular: Normal rate, regular rhythm. Grossly normal heart sounds.  Good peripheral  circulation. Respiratory: Normal respiratory effort.  No retractions. Lungs CTAB. Gastrointestinal: Soft with diffuse tenderness to palpation. No distention. Positive bowel sounds Musculoskeletal: No lower extremity tenderness nor edema.   Neurologic:  Normal speech and language.  Skin:  Skin is warm, dry and intact.  Psychiatric: Mood and affect are normal.   ____________________________________________   LABS (all labs ordered are listed, but only abnormal results are displayed)  Labs Reviewed  CBC - Abnormal; Notable for the following components:      Result Value   WBC 12.3 (*)    All other components within normal limits  COMPREHENSIVE METABOLIC PANEL - Abnormal; Notable for the following components:   Potassium 3.0 (*)    Chloride 100 (*)    CO2 18 (*)    Glucose, Bld 169 (*)    Total Protein 8.5 (*)    ALT 11 (*)    Anion gap 19 (*)    All other components within normal limits  LIPASE, BLOOD   ____________________________________________  EKG  none ____________________________________________  RADIOLOGY  No results found.  ____________________________________________   PROCEDURES  Procedure(s) performed: None  Procedures  Critical Care performed:  No  ____________________________________________   INITIAL IMPRESSION / ASSESSMENT AND PLAN / ED COURSE  As part of my medical decision making, I reviewed the following data within the electronic MEDICAL RECORD NUMBER Notes from prior ED visits and [place] Controlled Substance Database   This is a 36 year old female who comes into the hospital today with some abdominal pain as well as nausea and vomiting.  The patient has been seen here frequently in also has a history of gastroparesis.  My differential diagnosis includes gastroparesis, cyclic vomiting, gastroenteritis.  The patient had a CT scan done when she was here on the first which was unremarkable.  The patient also had a second visit on the first end of visit to [facility] yesterday.  Given the chronicity of the patient's pain I will not treat her with narcotics.  I gave the patient a liter of normal saline as well as some Toradol and Reglan.  The patient seemed to be doing well but I also then gave her a dose of Haldol.  I went back into evaluate the patient and she did not have any significant distress.  She was sleeping and appeared to be resting comfortably.  The patient will be discharged home as she does have an appointment with her primary care physician today.  She should follow-up and determine the appropriate way to treat her chronic abdominal pain and cyclic vomiting.  The patient will be discharged home.      ____________________________________________   FINAL CLINICAL IMPRESSION(S) / ED DIAGNOSES  Final diagnoses:  Generalized abdominal pain  Non-intractable cyclical vomiting with nausea     ED Discharge Orders        Ordered    metoCLOPramide (REGLAN) 10 MG tablet  Every 8 hours PRN     02/25/17 0701       Note:  This document was prepared using Dragon voice recognition software and may include unintentional dictation errors.    Loney Hering, MD 02/25/17 [phone]

## 2017-02-25 NOTE — Discharge Instructions (Signed)
Please follow up with your primary care physician as you have scheduled

## 2017-02-25 NOTE — ED Triage Notes (Signed)
Pt arrived to ED via EMS from home with c/o "shaking" for weeks. Pt left subwait area earlier this evening. 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 Pt was seen at [facility] on Monday for same complaint but LWBS. Pt increases in shaking when being spoke to by staff.

## 2017-02-26 ENCOUNTER — Emergency Department
Admission: EM | Admit: 2017-02-26 | Discharge: 2017-02-26 | Disposition: A | Discharge status: Home / Self Care | Payer: Medicaid Other | Attending: Student in an Organized Health Care Education/Training Program | Admitting: Student in an Organized Health Care Education/Training Program

## 2017-02-26 ENCOUNTER — Other Ambulatory Visit: Payer: Self-pay

## 2017-02-26 DIAGNOSIS — Z79899 Other long term (current) drug therapy: Secondary | ICD-10-CM | POA: Diagnosis not present

## 2017-02-26 DIAGNOSIS — Z7984 Long term (current) use of oral hypoglycemic drugs: Secondary | ICD-10-CM | POA: Diagnosis not present

## 2017-02-26 DIAGNOSIS — R112 Nausea with vomiting, unspecified: Secondary | ICD-10-CM | POA: Insufficient documentation

## 2017-02-26 DIAGNOSIS — J029 Acute pharyngitis, unspecified: Secondary | ICD-10-CM | POA: Insufficient documentation

## 2017-02-26 DIAGNOSIS — E1165 Type 2 diabetes mellitus with hyperglycemia: Secondary | ICD-10-CM | POA: Diagnosis not present

## 2017-02-26 DIAGNOSIS — F17210 Nicotine dependence, cigarettes, uncomplicated: Secondary | ICD-10-CM | POA: Insufficient documentation

## 2017-02-26 DIAGNOSIS — R739 Hyperglycemia, unspecified: Secondary | ICD-10-CM

## 2017-02-26 LAB — URINALYSIS, COMPLETE (UACMP) WITH MICROSCOPIC
Bacteria, UA: NONE SEEN
Bilirubin Urine: NEGATIVE
Glucose, UA: >=500 mg/dL — AB
KETONES UR: 80 mg/dL — AB
Leukocytes, UA: NEGATIVE
Nitrite: NEGATIVE
PH: 6 (ref 5.0–8.0)
Specific Gravity, Urine: 1.038 — ABNORMAL HIGH (ref 1.005–1.030)

## 2017-02-26 LAB — BLOOD GAS, VENOUS
Acid-Base Excess: 6.8 mmol/L — ABNORMAL HIGH (ref 0.0–2.0)
BICARBONATE: 31.3 mmol/L — AB (ref 20.0–28.0)
O2 Saturation: 75 %
PATIENT TEMPERATURE: 37
pCO2, Ven: 43 mmHg — ABNORMAL LOW (ref 44.0–60.0)
pH, Ven: 7.47 — ABNORMAL HIGH (ref 7.250–7.430)
pO2, Ven: 37 mmHg (ref 32.0–45.0)

## 2017-02-26 LAB — CBC
HCT: 45.8 % (ref 35.0–47.0)
Hemoglobin: 15.2 g/dL (ref 12.0–16.0)
MCH: 28 pg (ref 26.0–34.0)
MCHC: 33.1 g/dL (ref 32.0–36.0)
MCV: 84.5 fL (ref 80.0–100.0)
PLATELETS: 247 10*3/uL (ref 150–440)
RBC: 5.42 MIL/uL — AB (ref 3.80–5.20)
RDW: 13.2 % (ref 11.5–14.5)
WBC: 15.8 10*3/uL — ABNORMAL HIGH (ref 3.6–11.0)

## 2017-02-26 LAB — COMPREHENSIVE METABOLIC PANEL
ALBUMIN: 4.9 g/dL (ref 3.5–5.0)
ALK PHOS: 54 U/L (ref 38–126)
ALT: 12 U/L — ABNORMAL LOW (ref 14–54)
ANION GAP: 21 — AB (ref 5–15)
AST: 17 U/L (ref 15–41)
BUN: 13 mg/dL (ref 6–20)
CHLORIDE: 87 mmol/L — AB (ref 101–111)
CO2: 27 mmol/L (ref 22–32)
Calcium: 10.5 mg/dL — ABNORMAL HIGH (ref 8.9–10.3)
Creatinine, Ser: 0.7 mg/dL (ref 0.44–1.00)
GFR calc Af Amer: >60 mL/min (ref >60)
GFR calc non Af Amer: >60 mL/min (ref >60)
GLUCOSE: 294 mg/dL — AB (ref 65–99)
POTASSIUM: 2.5 mmol/L — AB (ref 3.5–5.1)
SODIUM: 135 mmol/L (ref 135–145)
Total Bilirubin: 1.5 mg/dL — ABNORMAL HIGH (ref 0.3–1.2)
Total Protein: 9.1 g/dL — ABNORMAL HIGH (ref 6.5–8.1)

## 2017-02-26 LAB — LIPASE, BLOOD: Lipase: 30 U/L (ref 11–51)

## 2017-02-26 LAB — INFLUENZA PANEL BY PCR (TYPE A & B)
INFLBPCR: NEGATIVE
Influenza A By PCR: NEGATIVE

## 2017-02-26 LAB — GLUCOSE, CAPILLARY
Glucose-Capillary: 328 mg/dL — ABNORMAL HIGH (ref 65–99)
Glucose-Capillary: 245 mg/dL — ABNORMAL HIGH (ref 65–99)

## 2017-02-26 MED ORDER — POTASSIUM CHLORIDE ER 10 MEQ PO TBCR: 10.0000 meq | EXTENDED_RELEASE_TABLET | Freq: Every day | ORAL | 0 refills | Status: DC

## 2017-02-26 MED ORDER — POTASSIUM CHLORIDE CRYS ER 20 MEQ PO TBCR
40.0000 meq | EXTENDED_RELEASE_TABLET | Freq: Once | ORAL | Status: AC
  Administered 2017-02-26: 40 meq via ORAL
  Filled 2017-02-26: qty 2

## 2017-02-26 MED ORDER — PROMETHAZINE HCL 25 MG RE SUPP: 25.0000 mg | Freq: Four times a day (QID) | RECTAL | 1 refills | Status: DC | PRN

## 2017-02-26 MED ORDER — LORAZEPAM 2 MG/ML IJ SOLN
0.5000 mg | Freq: Once | INTRAMUSCULAR | Status: AC
  Administered 2017-02-26: 0.5 mg via INTRAVENOUS
  Filled 2017-02-26: qty 1

## 2017-02-26 MED ORDER — AMOXICILLIN 500 MG PO CAPS: 500.0000 mg | ORAL_CAPSULE | Freq: Two times a day (BID) | ORAL | 0 refills | Status: AC

## 2017-02-26 MED ORDER — SODIUM CHLORIDE 0.9 % IV BOLUS (SEPSIS)
1000.0000 mL | Freq: Once | INTRAVENOUS | Status: AC
  Administered 2017-02-26: 1000 mL via INTRAVENOUS

## 2017-02-26 MED ORDER — MAGNESIUM SULFATE 2 GM/50ML IV SOLN
2.0000 g | Freq: Once | INTRAVENOUS | Status: AC
  Administered 2017-02-26: 2 g via INTRAVENOUS
  Filled 2017-02-26: qty 50

## 2017-02-26 MED ORDER — INSULIN ASPART 100 UNIT/ML ~~LOC~~ SOLN
4.0000 [IU] | Freq: Once | SUBCUTANEOUS | Status: AC
  Administered 2017-02-26: 4 [IU] via INTRAVENOUS
  Filled 2017-02-26: qty 1

## 2017-02-26 MED ORDER — AMOXICILLIN 500 MG PO CAPS
500.0000 mg | ORAL_CAPSULE | Freq: Once | ORAL | Status: AC
  Administered 2017-02-26: 500 mg via ORAL
  Filled 2017-02-26: qty 1

## 2017-02-26 NOTE — ED Notes (Signed)
Date and time results received: 02/26/17 5:51 PM   Test: potassium Critical Value: 2.5  Name of Provider Notified: Merlyn Lot  Orders Received? Or Actions Taken?: none further

## 2017-02-26 NOTE — ED Provider Notes (Signed)
[facility] Emergency Department Provider Note    First MD Initiated Contact with Patient 02/26/17 1844     (approximate)  I have reviewed the triage vital signs and the nursing notes.   HISTORY  Chief Complaint Hyperglycemia    HPI Kyra L Friesen is a 36 y.o. female with a history of diabetes on metformin and intermittent gastroparesis presents to the ER with a chief complaint of sore throat for 2 days having difficulty controlling her glucose  Past Medical History:  Diagnosis Date  . Diabetes mellitus without complication ([place])   . Gastroparesis   . Lupus   . Pseudoseizures   . Seizures ([place])    Family History  Problem Relation Age of Onset  . Breast cancer Maternal Grandmother    Past Surgical History:  Procedure Laterality Date  . APPENDECTOMY    . CHOLECYSTECTOMY     Patient Active Problem List   Diagnosis Date Noted  . Hypokalemia 12/24/2015  . Anemia 12/24/2015  . Diabetes ([place]) 12/24/2015  . Gastroparesis due to DM ([place])   . Acute respiratory alkalosis 12/22/2015  . Duodenitis 12/22/2015  . Vomiting 12/16/2015  . Intractable nausea and vomiting 12/10/2014  . Nausea & vomiting 12/10/2014  . Narcotic abuse ([place]) 12/09/2014  . Sepsis ([place]) 12/07/2014  . Anxiety   . Pyelonephritis 12/04/2014  . Lactic acidosis 12/04/2014    Class: Acute      Prior to Admission medications   Medication Sig Start Date End Date Taking? Authorizing Provider  [employer] SENNA 8.6 MG tablet Take 2 tablets by mouth at bedtime as needed for constipation. 11/25/16   [provider]  Diclofenac Sodium 3 % GEL Place 1 application onto the skin every 12 (twelve) hours as needed. Patient not taking: Reported on 06/09/2016 04/13/16   Orbie Pyo, MD  DULoxetine (CYMBALTA) 30 MG capsule Take 30 mg by mouth daily. 11/25/16   [provider]  feeding supplement (BOOST / RESOURCE BREEZE) LIQD Take 1 Container by mouth 3 (three) times  daily between meals. Patient not taking: Reported on 06/09/2016 12/24/15   Theodoro Grist, MD  fluticasone [facility]) 50 MCG/ACT nasal spray Place 2 sprays into both nostrils daily. Patient not taking: Reported on 02/22/2017 12/15/15   Hagler, Jami L, PA-C  HYDROcodone-acetaminophen (NORCO) 5-325 MG tablet Take 2 tablets by mouth every 6 (six) hours as needed for moderate pain. Patient not taking: Reported on 02/22/2017 01/02/17   Nena Polio, MD  hydrOXYzine (ATARAX/VISTARIL) 50 MG tablet Take 50 mg by mouth every 6 (six) hours. May take 2 additional doses as needed for anxiety. 12/03/16   [provider]  insulin NPH-regular Human (NOVOLIN 70/30) (70-30) 100 UNIT/ML injection Inject 22-25 Units into the skin 2 (two) times daily with a meal. 25 units in the morning and 22 units in the evening Patient not taking: Reported on 06/09/2016 12/18/15   Hugelmeyer, Ubaldo Glassing, DO  levETIRAcetam (KEPPRA) 500 MG tablet Take 500 mg by mouth 3 (three) times daily.    [provider]  levETIRAcetam (KEPPRA) 750 MG tablet Take 1 tablet (750 mg total) by mouth 2 (two) times daily. Patient not taking: Reported on 06/09/2016 08/21/15   Paulette Blanch, MD  metFORMIN (GLUCOPHAGE) 500 MG tablet Take 500 mg by mouth daily. 11/25/16   [provider]  metoCLOPramide (REGLAN) 10 MG tablet Take 1 tablet (10 mg total) by mouth every 8 (eight) hours as needed. 02/25/17   Loney Hering, MD  nortriptyline (  PAMELOR) 25 MG capsule Take 1 capsule (25 mg total) by mouth at bedtime. Patient not taking: Reported on 10/17/2016 06/18/16   Dustin Flock, MD  ondansetron (ZOFRAN ODT) 4 MG disintegrating tablet Take 1 tablet (4 mg total) by mouth every 6 (six) hours as needed for nausea or vomiting. 02/22/17   Delman Kitten, MD  pantoprazole (PROTONIX) 40 MG tablet Take 1 tablet (40 mg total) by mouth daily. Patient not taking: Reported on 02/22/2017 06/18/16   Dustin Flock, MD  traZODone (DESYREL) 50 MG tablet Take 50  mg by mouth at bedtime. 12/03/16   [provider]    Allergies Dilantin [phenytoin sodium extended]; Doxycycline; Other; Morphine and related; and Tylenol [acetaminophen]    Social History Social History   Tobacco Use  . Smoking status: Current Every Day Smoker    Packs/day: 0.50    Years: 10.00    Pack years: 5.00    Types: Cigarettes  . Smokeless tobacco: Never Used  Substance Use Topics  . Alcohol use: No  . Drug use: Yes    Types: Marijuana    Comment: 3 months ago.     Review of Systems Patient denies headaches, rhinorrhea, blurry vision, numbness, shortness of breath, chest pain, edema, cough, abdominal pain, nausea, vomiting, diarrhea, dysuria, fevers, rashes or hallucinations unless otherwise stated above in HPI. ____________________________________________   PHYSICAL EXAM:  VITAL SIGNS: Vitals:   02/26/17 1655  BP: (!) 120/96  Pulse: (!) 133  Resp: 16  Temp: 99.5 F (37.5 C)  SpO2: 99%    Constitutional: Alert and oriented. in no acute distress. Eyes: Conjunctivae are normal.  Head: Atraumatic. Nose: No congestion/rhinnorhea. Mouth/Throat: Mucous membranes are moist.  Bilateral tonsillar erythema with tonsillar exudates.  Uvula is midline.  No muffled voice.  No stridor. Neck: No stridor. Painless ROM.  Cardiovascular: Normal rate, regular rhythm. Grossly normal heart sounds.  Good peripheral circulation. Respiratory: Normal respiratory effort.  No retractions. Lungs CTAB. Gastrointestinal: Soft and nontender in all four quadrants. No distention. No abdominal bruits. No CVA tenderness. Genitourinary:  Musculoskeletal: No lower extremity tenderness nor edema.  No joint effusions. Neurologic:  Normal speech and language. No gross focal neurologic deficits are appreciated. No facial droop Skin:  Skin is warm, dry and intact. No rash noted. Psychiatric: Mood and affect are normal. Speech and behavior are  normal.  ____________________________________________   LABS (all labs ordered are listed, but only abnormal results are displayed)  Results for orders placed or performed during the hospital encounter of 02/26/17 (from the past 24 hour(s))  CBC     Status: Abnormal   Collection Time: 02/26/17  4:57 PM  Result Value Ref Range   WBC 15.8 (H) 3.6 - 11.0 K/uL   RBC 5.42 (H) 3.80 - 5.20 MIL/uL   Hemoglobin 15.2 12.0 - 16.0 g/dL   HCT 45.8 35.0 - 47.0 %   MCV 84.5 80.0 - 100.0 fL   MCH 28.0 26.0 - 34.0 pg   MCHC 33.1 32.0 - 36.0 g/dL   RDW 13.2 11.5 - 14.5 %   Platelets 247 150 - 440 K/uL  Lipase, blood     Status: None   Collection Time: 02/26/17  4:57 PM  Result Value Ref Range   Lipase 30 11 - 51 U/L  Comprehensive metabolic panel     Status: Abnormal   Collection Time: 02/26/17  4:57 PM  Result Value Ref Range   Sodium 135 135 - 145 mmol/L   Potassium 2.5 (LL) 3.5 -  5.1 mmol/L   Chloride 87 (L) 101 - 111 mmol/L   CO2 27 22 - 32 mmol/L   Glucose, Bld 294 (H) 65 - 99 mg/dL   BUN 13 6 - 20 mg/dL   Creatinine, Ser 0.70 0.44 - 1.00 mg/dL   Calcium 10.5 (H) 8.9 - 10.3 mg/dL   Total Protein 9.1 (H) 6.5 - 8.1 g/dL   Albumin 4.9 3.5 - 5.0 g/dL   AST 17 15 - 41 U/L   ALT 12 (L) 14 - 54 U/L   Alkaline Phosphatase 54 38 - 126 U/L   Total Bilirubin 1.5 (H) 0.3 - 1.2 mg/dL   GFR calc non Af Amer >60 >60 mL/min   GFR calc Af Amer >60 >60 mL/min   Anion gap 21 (H) 5 - 15  Glucose, capillary     Status: Abnormal   Collection Time: 02/26/17  4:57 PM  Result Value Ref Range   Glucose-Capillary 328 (H) 65 - 99 mg/dL  Urinalysis, Complete w Microscopic     Status: Abnormal   Collection Time: 02/26/17  7:11 PM  Result Value Ref Range   Color, Urine YELLOW (A) YELLOW   APPearance CLEAR (A) CLEAR   Specific Gravity, Urine 1.038 (H) 1.005 - 1.030   pH 6.0 5.0 - 8.0   Glucose, UA >=500 (A) NEGATIVE mg/dL   Hgb urine dipstick MODERATE (A) NEGATIVE   Bilirubin Urine NEGATIVE NEGATIVE    Ketones, ur 80 (A) NEGATIVE mg/dL   Protein, ur >=300 (A) NEGATIVE mg/dL   Nitrite NEGATIVE NEGATIVE   Leukocytes, UA NEGATIVE NEGATIVE   RBC / HPF 6-30 0 - 5 RBC/hpf   WBC, UA 0-5 0 - 5 WBC/hpf   Bacteria, UA NONE SEEN NONE SEEN   Squamous Epithelial / LPF 0-5 (A) NONE SEEN   Mucus PRESENT   Influenza panel by PCR (type A & B)     Status: None   Collection Time: 02/26/17  7:11 PM  Result Value Ref Range   Influenza A By PCR NEGATIVE NEGATIVE   Influenza B By PCR NEGATIVE NEGATIVE  Blood gas, venous     Status: Abnormal   Collection Time: 02/26/17  8:32 PM  Result Value Ref Range   pH, Ven 7.47 (H) 7.250 - 7.430   pCO2, Ven 43 (L) 44.0 - 60.0 mmHg   pO2, Ven 37.0 32.0 - 45.0 mmHg   Bicarbonate 31.3 (H) 20.0 - 28.0 mmol/L   Acid-Base Excess 6.8 (H) 0.0 - 2.0 mmol/L   O2 Saturation 75.0 %   Patient temperature 37.0    Collection site LINE    Sample type VENOUS   Glucose, capillary     Status: Abnormal   Collection Time: 02/26/17  9:10 PM  Result Value Ref Range   Glucose-Capillary 245 (H) 65 - 99 mg/dL   ____________________________________________ ____________________________________________  RADIOLOGY   ____________________________________________   PROCEDURES  Procedure(s) performed:  Procedures    Critical Care performed: no ____________________________________________   INITIAL IMPRESSION / ASSESSMENT AND PLAN / ED COURSE  Pertinent labs & imaging results that were available during my care of the patient were reviewed by me and considered in my medical decision making (see chart for details).  DDX: dka, hhs, dehydration, gastroparesis, enteritis, uri, pharyngitis  Jonni L Prouty is a 35 y.o. who presents to the ED with symptoms as described above.  She is afebrile but mildly tachycardic.  Patient is anxious appearing reporting nausea and throat pain.  No evidence of PTA or RPA.  No evidence of Ludwig's.  Her abdominal exam is soft and benign.  Has had  multiple visits to the ER for similar symptoms and poorly controlled diabetes.  Patient is noncompliant with her medications.  Will give IV fluids as well as IV Ativan to help with her anxiety is well as it is a very effective antiemetic.  Her abdominal exam is not clinically consistent with acute intra-abdominal process.  Given her multiple recent visits I do not believe it is in the patient's best interest to have repeat CT imaging with this clinical presentation.  She does have mild dehydration with hypokalemia that I will replete with IV knee centimeters and oral potassium.  Will give IV fluids for dehydration.  Does have mild leukocytosis which may be secondary to her nausea vomiting but I do suspect some component of pharyngitis.  Will send for flu.  The patient will be placed on continuous pulse oximetry and telemetry for monitoring.  Laboratory evaluation will be sent to evaluate for the above complaints.     Clinical Course as of Feb 27 2124  Wed Feb 26, 2017  2058 VBG shows no acidosis.  Abdominal exam is soft and benign.  Influenza is negative therefore will start on amoxicillin for strep pharyngitis.  We will not start on Decadron given her poorly controlled diabetes.  [PR]  2120 Patient reassessed repeat abdominal exam is soft and benign.  She was given IV magnesium and oral potassium both which she tolerated well.  Tachycardia resolved after IV fluids.  Patient tolerating oral hydration and able to ambulate with a steady gait.  This not clinically consistent with DKA.  May have a component of gastroparesis due to her poorly controlled diabetes but blood sugar is improving.  No evidence of infectious process.  [PR]    Clinical Course User Index [PR] Merlyn Lot, MD     ____________________________________________   FINAL CLINICAL IMPRESSION(S) / ED DIAGNOSES  Final diagnoses:  Hyperglycemia  Non-intractable vomiting with nausea, unspecified vomiting type  Sorethroat       NEW MEDICATIONS STARTED DURING THIS VISIT:  This SmartLink is deprecated. Use AVSMEDLIST instead to display the medication list for a patient.   Note:  This document was prepared using Dragon voice recognition software and may include unintentional dictation errors.    Merlyn Lot, MD 02/26/17 2128

## 2017-02-26 NOTE — ED Triage Notes (Signed)
Pt to ER via POV c/o hyperglycemia. State that sugar over 400 today. Pt type 2 diabetic, takes metformin. Pt alert and oriented X4, active, cooperative, pt in NAD. RR even and unlabored, color WNL.

## 2017-02-26 NOTE — ED Notes (Signed)
Family at bedside. 

## 2017-03-02 ENCOUNTER — Emergency Department: Payer: Medicaid Other

## 2017-03-02 ENCOUNTER — Emergency Department
Admission: EM | Admit: 2017-03-02 | Discharge: 2017-03-02 | Disposition: A | Discharge status: Home / Self Care | Payer: Medicaid Other | Attending: Emergency Medicine | Admitting: Emergency Medicine

## 2017-03-02 ENCOUNTER — Other Ambulatory Visit: Payer: Self-pay

## 2017-03-02 DIAGNOSIS — Z7984 Long term (current) use of oral hypoglycemic drugs: Secondary | ICD-10-CM | POA: Insufficient documentation

## 2017-03-02 DIAGNOSIS — F17210 Nicotine dependence, cigarettes, uncomplicated: Secondary | ICD-10-CM | POA: Diagnosis not present

## 2017-03-02 DIAGNOSIS — E119 Type 2 diabetes mellitus without complications: Secondary | ICD-10-CM | POA: Insufficient documentation

## 2017-03-02 DIAGNOSIS — Z79899 Other long term (current) drug therapy: Secondary | ICD-10-CM | POA: Insufficient documentation

## 2017-03-02 DIAGNOSIS — R079 Chest pain, unspecified: Secondary | ICD-10-CM

## 2017-03-02 HISTORY — DX: Systemic involvement of connective tissue, unspecified: M35.9

## 2017-03-02 LAB — COMPREHENSIVE METABOLIC PANEL
ALBUMIN: 4.1 g/dL (ref 3.5–5.0)
ALT: 7 U/L — ABNORMAL LOW (ref 14–54)
AST: 22 U/L (ref 15–41)
Alkaline Phosphatase: 47 U/L (ref 38–126)
Anion gap: 20 — ABNORMAL HIGH (ref 5–15)
BUN: 13 mg/dL (ref 6–20)
CHLORIDE: 89 mmol/L — AB (ref 101–111)
CO2: 25 mmol/L (ref 22–32)
Calcium: 10.1 mg/dL (ref 8.9–10.3)
Creatinine, Ser: 0.81 mg/dL (ref 0.44–1.00)
GFR calc Af Amer: >60 mL/min (ref >60)
GFR calc non Af Amer: >60 mL/min (ref >60)
GLUCOSE: 317 mg/dL — AB (ref 65–99)
POTASSIUM: 3.3 mmol/L — AB (ref 3.5–5.1)
SODIUM: 134 mmol/L — AB (ref 135–145)
Total Bilirubin: 2 mg/dL — ABNORMAL HIGH (ref 0.3–1.2)
Total Protein: 8.1 g/dL (ref 6.5–8.1)

## 2017-03-02 LAB — CBC
HCT: 48 % — ABNORMAL HIGH (ref 35.0–47.0)
Hemoglobin: 16.3 g/dL — ABNORMAL HIGH (ref 12.0–16.0)
MCH: 28.9 pg (ref 26.0–34.0)
MCHC: 34 g/dL (ref 32.0–36.0)
MCV: 85 fL (ref 80.0–100.0)
PLATELETS: 249 10*3/uL (ref 150–440)
RBC: 5.65 MIL/uL — ABNORMAL HIGH (ref 3.80–5.20)
RDW: 13.6 % (ref 11.5–14.5)
WBC: 7.6 10*3/uL (ref 3.6–11.0)

## 2017-03-02 LAB — TROPONIN I: Troponin I: <0.03 ng/mL (ref <0.03)

## 2017-03-02 LAB — FIBRIN DERIVATIVES D-DIMER (ARMC ONLY): Fibrin derivatives D-dimer (ARMC): 532.56 ng/mL (FEU) — ABNORMAL HIGH (ref 0.00–499.00)

## 2017-03-02 MED ORDER — SODIUM CHLORIDE 0.9 % IV BOLUS (SEPSIS)
1000.0000 mL | Freq: Once | INTRAVENOUS | Status: AC
  Administered 2017-03-02: 1000 mL via INTRAVENOUS

## 2017-03-02 MED ORDER — IOPAMIDOL (ISOVUE-370) INJECTION 76%
75.0000 mL | Freq: Once | INTRAVENOUS | Status: AC | PRN
  Administered 2017-03-02: 75 mL via INTRAVENOUS

## 2017-03-02 MED ORDER — KETOROLAC TROMETHAMINE 30 MG/ML IJ SOLN
30.0000 mg | Freq: Once | INTRAMUSCULAR | Status: AC
  Administered 2017-03-02: 30 mg via INTRAVENOUS
  Filled 2017-03-02: qty 1

## 2017-03-02 NOTE — ED Notes (Signed)
EDP in rm for IV access with ultrasound.

## 2017-03-02 NOTE — ED Triage Notes (Signed)
Per EMS, pt reports chest pain for the last 3 days with radiation to left ribs and arm. Pt reports some SHOB when taking a deep breath. Pt A&O at this time. EMS reports pt given 324 asa and is sinus tach during transport.

## 2017-03-02 NOTE — ED Notes (Signed)

## 2017-03-02 NOTE — ED Provider Notes (Signed)
[facility] Emergency Department Provider Note  Time seen: 3:26 PM  I have reviewed the triage vital signs and the nursing notes.   HISTORY  Chief Complaint Chest Pain    HPI Maleny L Vanderhoff is a 36 y.o. female with a past medical history of diabetes, seizures, gastroparesis, presents to the emergency department for left-sided chest pain.  According to the patient for the past 3 days she has been experiencing left-sided chest pain worse with deep inspiration.  Denies any cough or congestion.  States intermittent nausea which is somewhat chronic.  Also states intermittent diaphoresis.  Denies any leg pain or swelling.  Denies any vomiting.  Patient describes the left chest pain as moderate, sharp and constant but worse with deep inspiration.  Worse if you press on the area.   Past Medical History:  Diagnosis Date  . Diabetes mellitus without complication ([place])   . Gastroparesis   . Lupus   . Pseudoseizures   . Seizures [facility])     Patient Active Problem List   Diagnosis Date Noted  . Hypokalemia 12/24/2015  . Anemia 12/24/2015  . Diabetes ([place]) 12/24/2015  . Gastroparesis due to DM ([place])   . Acute respiratory alkalosis 12/22/2015  . Duodenitis 12/22/2015  . Vomiting 12/16/2015  . Intractable nausea and vomiting 12/10/2014  . Nausea & vomiting 12/10/2014  . Narcotic abuse ([place]) 12/09/2014  . Sepsis ([place]) 12/07/2014  . Anxiety   . Pyelonephritis 12/04/2014  . Lactic acidosis 12/04/2014    Class: Acute    Past Surgical History:  Procedure Laterality Date  . APPENDECTOMY    . CHOLECYSTECTOMY      Prior to Admission medications   Medication Sig Start Date End Date Taking? Authorizing Provider  amoxicillin (AMOXIL) 500 MG capsule Take 1 capsule (500 mg total) by mouth 2 (two) times daily for 5 days. 02/26/17 03/03/17  Merlyn Lot, MD  [employer] SENNA 8.6 MG tablet Take 2 tablets by mouth at bedtime as needed for constipation. 11/25/16   [provider]  Diclofenac Sodium 3 % GEL Place 1 application onto the skin every 12 (twelve) hours as needed. Patient not taking: Reported on 06/09/2016 04/13/16   Orbie Pyo, MD  DULoxetine (CYMBALTA) 30 MG capsule Take 30 mg by mouth daily. 11/25/16   [provider]  feeding supplement (BOOST / RESOURCE BREEZE) LIQD Take 1 Container by mouth 3 (three) times daily between meals. Patient not taking: Reported on 06/09/2016 12/24/15   Theodoro Grist, MD  fluticasone [facility]) 50 MCG/ACT nasal spray Place 2 sprays into both nostrils daily. Patient not taking: Reported on 02/22/2017 12/15/15   Hagler, Jami L, PA-C  HYDROcodone-acetaminophen (NORCO) 5-325 MG tablet Take 2 tablets by mouth every 6 (six) hours as needed for moderate pain. Patient not taking: Reported on 02/22/2017 01/02/17   Nena Polio, MD  hydrOXYzine (ATARAX/VISTARIL) 50 MG tablet Take 50 mg by mouth every 6 (six) hours. May take 2 additional doses as needed for anxiety. 12/03/16   [provider]  insulin NPH-regular Human (NOVOLIN 70/30) (70-30) 100 UNIT/ML injection Inject 22-25 Units into the skin 2 (two) times daily with a meal. 25 units in the morning and 22 units in the evening Patient not taking: Reported on 06/09/2016 12/18/15   Hugelmeyer, Ubaldo Glassing, DO  levETIRAcetam (KEPPRA) 500 MG tablet Take 500 mg by mouth 3 (three) times daily.    [provider]  levETIRAcetam (KEPPRA) 750 MG tablet Take 1 tablet (750 mg total) by  mouth 2 (two) times daily. Patient not taking: Reported on 06/09/2016 08/21/15   Paulette Blanch, MD  metFORMIN (GLUCOPHAGE) 500 MG tablet Take 500 mg by mouth daily. 11/25/16   [provider]  metoCLOPramide (REGLAN) 10 MG tablet Take 1 tablet (10 mg total) by mouth every 8 (eight) hours as needed. 02/25/17   Loney Hering, MD  nortriptyline (PAMELOR) 25 MG capsule Take 1 capsule (25 mg total) by mouth at bedtime. Patient not taking: Reported on 10/17/2016 06/18/16    Dustin Flock, MD  ondansetron (ZOFRAN ODT) 4 MG disintegrating tablet Take 1 tablet (4 mg total) by mouth every 6 (six) hours as needed for nausea or vomiting. 02/22/17   Delman Kitten, MD  pantoprazole (PROTONIX) 40 MG tablet Take 1 tablet (40 mg total) by mouth daily. Patient not taking: Reported on 02/22/2017 06/18/16   Dustin Flock, MD  potassium chloride (K-DUR) 10 MEQ tablet Take 1 tablet (10 mEq total) by mouth daily. 02/26/17   Merlyn Lot, MD  promethazine (PHENERGAN) 25 MG suppository Place 1 suppository (25 mg total) rectally every 6 (six) hours as needed for nausea. 02/26/17 02/26/18  Merlyn Lot, MD  traZODone (DESYREL) 50 MG tablet Take 50 mg by mouth at bedtime. 12/03/16   [provider]    Allergies  Allergen Reactions  . Dilantin [Phenytoin Sodium Extended] Hives  . Doxycycline Swelling  . Other   . Morphine And Related Rash  . Tylenol [Acetaminophen] Rash    Family History  Problem Relation Age of Onset  . Breast cancer Maternal Grandmother     Social History Social History   Tobacco Use  . Smoking status: Current Every Day Smoker    Packs/day: 0.50    Years: 10.00    Pack years: 5.00    Types: Cigarettes  . Smokeless tobacco: Never Used  Substance Use Topics  . Alcohol use: No  . Drug use: Yes    Types: Marijuana    Comment: 3 months ago.     Review of Systems Constitutional: Negative for fever. Cardiovascular: Left chest pain worse with inspiration Respiratory: Negative for shortness of breath. Gastrointestinal: Negative for abdominal pain.  Intermittent nausea. Musculoskeletal: Negative for leg pain and swelling Neurological: Negative for headache All other ROS negative  ____________________________________________   PHYSICAL EXAM:  VITAL SIGNS: ED Triage Vitals  Enc Vitals Group     BP 03/02/17 1525 (!) 141/84     Pulse Rate 03/02/17 1525 (!) 136     Resp 03/02/17 1525 17     Temp 03/02/17 1525 98.1 F (36.7 C)      Temp Source 03/02/17 1525 Oral     SpO2 03/02/17 1525 97 %     Weight 03/02/17 1526 121 lb (54.9 kg)     Height 03/02/17 1526 5\' 5"  (1.651 m)     Head Circumference --      Peak Flow --      Pain Score 03/02/17 1525 10     Pain Loc --      Pain Edu? --      Excl. in [place]? --    Constitutional: Alert and oriented. Well appearing and in no distress. Eyes: Normal exam ENT   Head: Normocephalic and atraumatic.   Mouth/Throat: Mucous membranes are moist. Cardiovascular: Normal rate, regular rhythm. No murmur Respiratory: Normal respiratory effort without tachypnea nor retractions. Breath sounds are clear Gastrointestinal: Soft and nontender. No distention.   Musculoskeletal: Nontender with normal range of motion in all extremities.  No lower extremity tenderness or edema. Neurologic:  Normal speech and language. No gross focal neurologic deficits  Skin:  Skin is warm, dry and intact.  Psychiatric: Mood and affect are normal.   ____________________________________________    EKG  EKG shows sinus tachycardia 125 bpm with a QRS, normal axis, normal intervals, nonspecific ST changes without ST elevation.  ____________________________________________    RADIOLOGY  X-ray negative CTA negative  ____________________________________________   INITIAL IMPRESSION / ASSESSMENT AND PLAN / ED COURSE  Pertinent labs & imaging results that were available during my care of the patient were reviewed by me and considered in my medical decision making (see chart for details).  Patient presents to the emergency department for left chest pain worse with deep inspiration.  Differential would include ACS, chest wall pain, PE, pneumonia, pneumothorax, URI. On examination the patient has significant left chest wall tenderness to palpation, reassuring for likely chest wall/muscular skeletal pain.  However the patient remains tachycardic currently around 120-130 bpm.  Given the tachycardia and  chest pain worse with deep inspiration will obtain a d-dimer in addition to the patient's workup.  We will treat her discomfort with Toradol while awaiting results.  Patient agreeable to this plan of care.  I have reviewed the patient's recent ER records.  Patient has been to the emergency department 14 times in the past 6 months for various complaints including abdominal pain, sore throat, elevated blood glucose and seizure-like activity.  CT angiography is negative.  Patient has been given IV fluids, heart rate is currently around 100-110 bpm.  Patient did have an anion gap I discussed with the patient the need to significantly hydrate over the next several days avoid alcohol use and to monitor her blood glucose carefully.  Patient's VBG is largely normal.  ____________________________________________   FINAL CLINICAL IMPRESSION(S) / ED DIAGNOSES  Chest pain    Harvest Dark, MD 03/02/17 Bosie Helper

## 2017-03-02 NOTE — Discharge Instructions (Signed)
You have been seen in the emergency department today for chest pain. Your workup has shown normal results. As we discussed please follow-up with your primary care physician in the next 1-2 days for recheck. Return to the emergency department for any further chest pain, trouble breathing, or any other symptom personally concerning to yourself. °

## 2017-03-02 NOTE — ED Notes (Signed)
Patient transported to X-ray 

## 2017-03-02 NOTE — ED Notes (Signed)
IV attempt x2 by Levada Dy RN unsuccessful, will follow up for access.

## 2017-03-02 NOTE — ED Notes (Signed)
Patient transported to CT 

## 2017-03-04 ENCOUNTER — Ambulatory Visit: Payer: Self-pay | Admitting: *Deleted

## 2017-03-04 LAB — BLOOD GAS, VENOUS
PATIENT TEMPERATURE: 37
PCO2 VEN: 53 mmHg (ref 44.0–60.0)
PH VEN: 7.43 (ref 7.250–7.430)

## 2017-03-04 NOTE — Telephone Encounter (Signed)
Pt's mother called to say her daughter's blood sugar has been elevated up to 450 and this morning it is 390. She is very weak, constantly vomiting. She is dehydrated, voiding very little.  Mother states that patient is not on Insulin, just Metformin. Advised to call 911 to get her to the ED. Mother voiced understanding and will call them.  Reason for Disposition . Very weak (e.g., can't stand)  Answer Assessment - Initial Assessment Questions 1. BLOOD GLUCOSE: "What is your blood glucose level?"      390 2. ONSET: "When did you check the blood glucose?"     This morning 3. USUAL RANGE: "What is your glucose level usually?" (e.g., usual fasting morning value, usual evening value)     178 4. KETONES: "Do you check for ketones (urine or blood test strips)?" If yes, ask: "What does the test show now?"      no 5. TYPE 1 or 2:  "Do you know what type of diabetes you have?"  (e.g., Type 1, Type 2, Gestational; doesn't know)      Type 2 6. INSULIN: "Do you take insulin?" If yes, ask: "Have you missed any shots recently?"     no 7. DIABETES PILLS: "Do you take any pills for your diabetes?" If yes, ask: "Have you missed taking any pills recently?"     Yes, last night was the last time 8. OTHER SYMPTOMS: "Do you have any symptoms?" (e.g., fever, frequent urination, difficulty breathing, dizziness, weakness, vomiting)     Feels warm, vomiting, decrease in urination 9. PREGNANCY: "Is there any chance you are pregnant?" "When was your last menstrual period?"     No LMP now  Protocols used: DIABETES - HIGH BLOOD SUGAR-A-AH

## 2017-03-05 ENCOUNTER — Encounter: Payer: Self-pay | Admitting: *Deleted

## 2017-03-05 ENCOUNTER — Other Ambulatory Visit: Payer: Self-pay

## 2017-03-05 ENCOUNTER — Inpatient Hospital Stay
Admission: EM | Admit: 2017-03-05 | Discharge: 2017-03-07 | DRG: 074 | Disposition: A | Discharge status: Home / Self Care | Payer: Medicaid Other | Attending: Internal Medicine | Admitting: Internal Medicine

## 2017-03-05 DIAGNOSIS — F17210 Nicotine dependence, cigarettes, uncomplicated: Secondary | ICD-10-CM | POA: Diagnosis present

## 2017-03-05 DIAGNOSIS — K3184 Gastroparesis: Secondary | ICD-10-CM | POA: Diagnosis present

## 2017-03-05 DIAGNOSIS — Z885 Allergy status to narcotic agent status: Secondary | ICD-10-CM

## 2017-03-05 DIAGNOSIS — M329 Systemic lupus erythematosus, unspecified: Secondary | ICD-10-CM | POA: Diagnosis present

## 2017-03-05 DIAGNOSIS — Z91120 Patient's intentional underdosing of medication regimen due to financial hardship: Secondary | ICD-10-CM

## 2017-03-05 DIAGNOSIS — R112 Nausea with vomiting, unspecified: Secondary | ICD-10-CM | POA: Diagnosis present

## 2017-03-05 DIAGNOSIS — Z7984 Long term (current) use of oral hypoglycemic drugs: Secondary | ICD-10-CM

## 2017-03-05 DIAGNOSIS — E876 Hypokalemia: Secondary | ICD-10-CM

## 2017-03-05 DIAGNOSIS — M359 Systemic involvement of connective tissue, unspecified: Secondary | ICD-10-CM | POA: Diagnosis present

## 2017-03-05 DIAGNOSIS — X58XXXA Exposure to other specified factors, initial encounter: Secondary | ICD-10-CM | POA: Diagnosis present

## 2017-03-05 DIAGNOSIS — G40909 Epilepsy, unspecified, not intractable, without status epilepticus: Secondary | ICD-10-CM | POA: Diagnosis present

## 2017-03-05 DIAGNOSIS — T383X6A Underdosing of insulin and oral hypoglycemic [antidiabetic] drugs, initial encounter: Secondary | ICD-10-CM | POA: Diagnosis present

## 2017-03-05 DIAGNOSIS — Z886 Allergy status to analgesic agent status: Secondary | ICD-10-CM

## 2017-03-05 DIAGNOSIS — R569 Unspecified convulsions: Secondary | ICD-10-CM

## 2017-03-05 DIAGNOSIS — E1143 Type 2 diabetes mellitus with diabetic autonomic (poly)neuropathy: Principal | ICD-10-CM | POA: Diagnosis present

## 2017-03-05 DIAGNOSIS — Z881 Allergy status to other antibiotic agents status: Secondary | ICD-10-CM

## 2017-03-05 DIAGNOSIS — Z79899 Other long term (current) drug therapy: Secondary | ICD-10-CM

## 2017-03-05 DIAGNOSIS — E1165 Type 2 diabetes mellitus with hyperglycemia: Secondary | ICD-10-CM | POA: Diagnosis present

## 2017-03-05 LAB — CBC
HEMATOCRIT: 41.8 % (ref 35.0–47.0)
Hemoglobin: 14 g/dL (ref 12.0–16.0)
MCH: 28.4 pg (ref 26.0–34.0)
MCHC: 33.4 g/dL (ref 32.0–36.0)
MCV: 85 fL (ref 80.0–100.0)
PLATELETS: 192 10*3/uL (ref 150–440)
RBC: 4.92 MIL/uL (ref 3.80–5.20)
RDW: 12.9 % (ref 11.5–14.5)
WBC: 6.9 10*3/uL (ref 3.6–11.0)

## 2017-03-05 LAB — GLUCOSE, CAPILLARY: Glucose-Capillary: 303 mg/dL — ABNORMAL HIGH (ref 65–99)

## 2017-03-05 MED ORDER — SODIUM CHLORIDE 0.9 % IV BOLUS (SEPSIS)
1000.0000 mL | Freq: Once | INTRAVENOUS | Status: AC
  Administered 2017-03-05: 1000 mL via INTRAVENOUS

## 2017-03-05 MED ORDER — HALOPERIDOL LACTATE 5 MG/ML IJ SOLN
2.0000 mg | Freq: Once | INTRAMUSCULAR | Status: AC
  Administered 2017-03-05: 2 mg via INTRAVENOUS
  Filled 2017-03-05: qty 1

## 2017-03-05 MED ORDER — ONDANSETRON HCL 4 MG/2ML IJ SOLN
4.0000 mg | Freq: Once | INTRAMUSCULAR | Status: AC
  Administered 2017-03-05: 4 mg via INTRAVENOUS
  Filled 2017-03-05: qty 2

## 2017-03-05 MED ORDER — SODIUM CHLORIDE 0.9 % IV BOLUS (SEPSIS)
1000.0000 mL | Freq: Once | INTRAVENOUS | Status: AC
  Administered 2017-03-06: 1000 mL via INTRAVENOUS

## 2017-03-05 NOTE — ED Notes (Signed)
ED Provider at bedside. 

## 2017-03-05 NOTE — ED Notes (Signed)
Pt crying and states abd is cramping and hurting

## 2017-03-05 NOTE — Telephone Encounter (Signed)
Routed to pcp

## 2017-03-05 NOTE — ED Provider Notes (Signed)
[facility] Emergency Department Provider Note    First MD Initiated Contact with Patient 03/05/17 2305     (approximate)  I have reviewed the triage vital signs and the nursing notes.   HISTORY  Chief Complaint Seizures   HPI Rachael Gray is a 36 y.o. female with below list of chronic medical conditions well known to [facility] emergency department presents to the emergency department with history of a witnessed seizure today.  Patient states his seizure was witnessed by her mother. On my arrival to the room patient very irate stating that she "hates all of you all".  Patient refusing and reluctant to answer any questions.   Past Medical History:  Diagnosis Date  . Collagen vascular disease ([place])   . Diabetes mellitus without complication ([place])   . Gastroparesis   . Lupus   . Pseudoseizures   . Seizures [facility])     Patient Active Problem List   Diagnosis Date Noted  . Hypokalemia 12/24/2015  . Anemia 12/24/2015  . Diabetes ([place]) 12/24/2015  . Gastroparesis due to DM ([place])   . Acute respiratory alkalosis 12/22/2015  . Duodenitis 12/22/2015  . Vomiting 12/16/2015  . Intractable nausea and vomiting 12/10/2014  . Nausea & vomiting 12/10/2014  . Narcotic abuse ([place]) 12/09/2014  . Sepsis ([place]) 12/07/2014  . Anxiety   . Pyelonephritis 12/04/2014  . Lactic acidosis 12/04/2014    Class: Acute    Past Surgical History:  Procedure Laterality Date  . APPENDECTOMY    . CHOLECYSTECTOMY      Prior to Admission medications   Medication Sig Start Date End Date Taking? Authorizing Provider  [employer] SENNA 8.6 MG tablet Take 2 tablets by mouth at bedtime as needed for constipation. 11/25/16   [provider]  HYDROcodone-acetaminophen (NORCO) 5-325 MG tablet Take 2 tablets by mouth every 6 (six) hours as needed for moderate pain. Patient not taking: Reported on 02/22/2017 01/02/17   Nena Polio, MD  insulin NPH-regular Human (NOVOLIN 70/30)  (70-30) 100 UNIT/ML injection Inject 22-25 Units into the skin 2 (two) times daily with a meal. 25 units in the morning and 22 units in the evening Patient not taking: Reported on 06/09/2016 12/18/15   Hugelmeyer, Ubaldo Glassing, DO  levETIRAcetam (KEPPRA) 750 MG tablet Take 1 tablet (750 mg total) by mouth 2 (two) times daily. Patient not taking: Reported on 06/09/2016 08/21/15   Paulette Blanch, MD  metFORMIN (GLUCOPHAGE) 500 MG tablet Take 500 mg by mouth daily. 11/25/16   [provider]  metoCLOPramide (REGLAN) 10 MG tablet Take 1 tablet (10 mg total) by mouth every 8 (eight) hours as needed. 02/25/17   Loney Hering, MD  ondansetron (ZOFRAN ODT) 4 MG disintegrating tablet Take 1 tablet (4 mg total) by mouth every 6 (six) hours as needed for nausea or vomiting. 02/22/17   Delman Kitten, MD  potassium chloride (K-DUR) 10 MEQ tablet Take 1 tablet (10 mEq total) by mouth daily. 02/26/17   Merlyn Lot, MD  promethazine (PHENERGAN) 25 MG suppository Place 1 suppository (25 mg total) rectally every 6 (six) hours as needed for nausea. 02/26/17 02/26/18  Merlyn Lot, MD    Allergies Dilantin [phenytoin sodium extended]; Doxycycline; Other; Morphine and related; and Tylenol [acetaminophen]  Family History  Problem Relation Age of Onset  . Breast cancer Maternal Grandmother     Social History Social History   Tobacco Use  . Smoking status: Current Every Day Smoker    Packs/day: 0.50  Years: 10.00    Pack years: 5.00    Types: Cigarettes  . Smokeless tobacco: Never Used  Substance Use Topics  . Alcohol use: No  . Drug use: Yes    Types: Marijuana    Comment: 3 months ago.     Review of Systems Constitutional: No fever/chills Eyes: No visual changes. ENT: No sore throat. Cardiovascular: Denies chest pain. Respiratory: Denies shortness of breath. Gastrointestinal: Positive for abdominal pain.  No nausea, no vomiting.  No diarrhea.  No constipation. Genitourinary: Negative for  dysuria. Musculoskeletal: Negative for neck pain.  Negative for back pain. Integumentary: Negative for rash. Neurological: Negative for headaches, focal weakness or numbness.   ____________________________________________   PHYSICAL EXAM:  VITAL SIGNS: ED Triage Vitals  Enc Vitals Group     BP 03/05/17 2200 132/84     Pulse Rate 03/05/17 2200 (!) 115     Resp 03/05/17 2200 14     Temp 03/05/17 2202 98.8 F (37.1 C)     Temp Source 03/05/17 2202 Oral     SpO2 03/05/17 2200 100 %     Weight 03/05/17 2203 54.9 kg (121 lb)     Height 03/05/17 2203 1.651 m (5\' 5" )     Head Circumference --      Peak Flow --      Pain Score --      Pain Loc --      Pain Edu? --      Excl. in [place]? --     Constitutional: Alert and oriented.  Irate Eyes: Conjunctivae are normal. Head: Atraumatic. Ears:  Healthy appearing ear canals and TMs bilaterally Nose: No congestion/rhinnorhea. Mouth/Throat: Mucous membranes are moist.  Oropharynx non-erythematous. Neck: No stridor.   Cardiovascular: Normal rate, regular rhythm. Good peripheral circulation. Grossly normal heart sounds. Respiratory: Normal respiratory effort.  No retractions. Lungs CTAB. Gastrointestinal: Generalized tenderness with very very mild palpation no distention.  Musculoskeletal: No lower extremity tenderness nor edema. No gross deformities of extremities. Neurologic:  Normal speech and language. No gross focal neurologic deficits are appreciated.  Skin:  Skin is warm, dry and intact. No rash noted. Psychiatric: Mood and affect are normal. Speech and behavior are normal.  ____________________________________________   LABS (all labs ordered are listed, but only abnormal results are displayed)  Labs Reviewed  GLUCOSE, CAPILLARY - Abnormal; Notable for the following components:      Result Value   Glucose-Capillary 303 (*)    All other components within normal limits  CBC  COMPREHENSIVE METABOLIC PANEL  CBG MONITORING, ED    POC URINE PREG, ED   ____________________________________________  EKG  ED ECG REPORT I, [place] Zaryia Markel, the attending physician, personally viewed and interpreted this ECG.   Date: 03/06/2017  EKG Time: 10:01 PM  Rate: 116  Rhythm: Sinus tachycardia  Axis: Normal  Intervals: Normal  ST&T Change: None     Procedures   ____________________________________________   INITIAL IMPRESSION / ASSESSMENT AND PLAN / ED COURSE  As part of my medical decision making, I reviewed the following data within the [place]   40:98 PM 36 year old female presented with above-stated history and physical exam who is very irate on my arrival to the room.  Patient received 2 mg IV Haldol with resolution of nausea and vomiting.  Patient also states that abdominal pain is markedly improved.  1:30 AM Patient's nausea and abdominal discomfort improved at this time.  Patient noted to be hypokalemic on laboratory data with a  potassium of 2.3 and as such potassium chloride IV ordered.  Patient discussed with Dr. Jannifer Franklin for hospital admission for further evaluation and management of gastroparesis with hypokalemia ____________________________________________  FINAL CLINICAL IMPRESSION(S) / ED DIAGNOSES  Final diagnoses:  Gastroparesis  Seizure-like activity ([place])  Hypokalemia     MEDICATIONS GIVEN DURING THIS VISIT:  Medications  haloperidol lactate (HALDOL) injection 2 mg (not administered)  ondansetron (ZOFRAN) injection 4 mg (4 mg Intravenous Given 03/05/17 2257)     ED Discharge Orders    None       Note:  This document was prepared using Dragon voice recognition software and may include unintentional dictation errors.    Gregor Hams, MD 03/06/17 [phone]

## 2017-03-05 NOTE — ED Triage Notes (Signed)
Per EMS pt had a seizure with a hx of multiple seizures. Pt has Strep throat and n/v. Pt is alert, quiet and able to answer questions

## 2017-03-06 ENCOUNTER — Other Ambulatory Visit: Payer: Self-pay

## 2017-03-06 DIAGNOSIS — G40909 Epilepsy, unspecified, not intractable, without status epilepticus: Secondary | ICD-10-CM | POA: Diagnosis present

## 2017-03-06 DIAGNOSIS — M329 Systemic lupus erythematosus, unspecified: Secondary | ICD-10-CM | POA: Diagnosis present

## 2017-03-06 DIAGNOSIS — E876 Hypokalemia: Secondary | ICD-10-CM | POA: Diagnosis present

## 2017-03-06 DIAGNOSIS — Z881 Allergy status to other antibiotic agents status: Secondary | ICD-10-CM | POA: Diagnosis not present

## 2017-03-06 DIAGNOSIS — X58XXXA Exposure to other specified factors, initial encounter: Secondary | ICD-10-CM | POA: Diagnosis present

## 2017-03-06 DIAGNOSIS — Z7984 Long term (current) use of oral hypoglycemic drugs: Secondary | ICD-10-CM | POA: Diagnosis not present

## 2017-03-06 DIAGNOSIS — F17210 Nicotine dependence, cigarettes, uncomplicated: Secondary | ICD-10-CM | POA: Diagnosis present

## 2017-03-06 DIAGNOSIS — K3184 Gastroparesis: Secondary | ICD-10-CM | POA: Diagnosis present

## 2017-03-06 DIAGNOSIS — Z885 Allergy status to narcotic agent status: Secondary | ICD-10-CM | POA: Diagnosis not present

## 2017-03-06 DIAGNOSIS — E1165 Type 2 diabetes mellitus with hyperglycemia: Secondary | ICD-10-CM | POA: Diagnosis present

## 2017-03-06 DIAGNOSIS — Z79899 Other long term (current) drug therapy: Secondary | ICD-10-CM | POA: Diagnosis not present

## 2017-03-06 DIAGNOSIS — Z886 Allergy status to analgesic agent status: Secondary | ICD-10-CM | POA: Diagnosis not present

## 2017-03-06 DIAGNOSIS — E1143 Type 2 diabetes mellitus with diabetic autonomic (poly)neuropathy: Secondary | ICD-10-CM | POA: Diagnosis present

## 2017-03-06 DIAGNOSIS — T383X6A Underdosing of insulin and oral hypoglycemic [antidiabetic] drugs, initial encounter: Secondary | ICD-10-CM | POA: Diagnosis present

## 2017-03-06 DIAGNOSIS — Z91120 Patient's intentional underdosing of medication regimen due to financial hardship: Secondary | ICD-10-CM | POA: Diagnosis not present

## 2017-03-06 DIAGNOSIS — M359 Systemic involvement of connective tissue, unspecified: Secondary | ICD-10-CM | POA: Diagnosis present

## 2017-03-06 LAB — URINALYSIS, COMPLETE (UACMP) WITH MICROSCOPIC
Bacteria, UA: NONE SEEN
Bilirubin Urine: NEGATIVE
Glucose, UA: >=500 mg/dL — AB
Hgb urine dipstick: NEGATIVE
Ketones, ur: 20 mg/dL — AB
Leukocytes, UA: NEGATIVE
Nitrite: NEGATIVE
Protein, ur: NEGATIVE mg/dL
Specific Gravity, Urine: 1.03 (ref 1.005–1.030)
pH: 6 (ref 5.0–8.0)

## 2017-03-06 LAB — HEMOGLOBIN A1C
Hgb A1c MFr Bld: 9.2 % — ABNORMAL HIGH (ref 4.8–5.6)
Mean Plasma Glucose: 217.34 mg/dL

## 2017-03-06 LAB — POTASSIUM: POTASSIUM: 2.6 mmol/L — AB (ref 3.5–5.1)

## 2017-03-06 LAB — URINE DRUG SCREEN, QUALITATIVE (ARMC ONLY)
Amphetamines, Ur Screen: NOT DETECTED
Barbiturates, Ur Screen: NOT DETECTED
Benzodiazepine, Ur Scrn: NOT DETECTED
Cannabinoid 50 Ng, Ur ~~LOC~~: POSITIVE — AB
Cocaine Metabolite,Ur ~~LOC~~: NOT DETECTED
MDMA (Ecstasy)Ur Screen: NOT DETECTED
Methadone Scn, Ur: NOT DETECTED
Opiate, Ur Screen: NOT DETECTED
Phencyclidine (PCP) Ur S: NOT DETECTED
Tricyclic, Ur Screen: NOT DETECTED

## 2017-03-06 LAB — COMPREHENSIVE METABOLIC PANEL
ALT: 11 U/L — AB (ref 14–54)
AST: 16 U/L (ref 15–41)
Albumin: 3.9 g/dL (ref 3.5–5.0)
Alkaline Phosphatase: 40 U/L (ref 38–126)
Anion gap: 13 (ref 5–15)
BUN: 5 mg/dL — ABNORMAL LOW (ref 6–20)
CHLORIDE: 90 mmol/L — AB (ref 101–111)
CO2: 33 mmol/L — AB (ref 22–32)
CREATININE: 0.54 mg/dL (ref 0.44–1.00)
Calcium: 9.5 mg/dL (ref 8.9–10.3)
GFR calc non Af Amer: >60 mL/min (ref >60)
Glucose, Bld: 312 mg/dL — ABNORMAL HIGH (ref 65–99)
POTASSIUM: 2.3 mmol/L — AB (ref 3.5–5.1)
SODIUM: 136 mmol/L (ref 135–145)
Total Bilirubin: 0.8 mg/dL (ref 0.3–1.2)
Total Protein: 7.4 g/dL (ref 6.5–8.1)

## 2017-03-06 LAB — TSH: TSH: 0.839 u[IU]/mL (ref 0.350–4.500)

## 2017-03-06 LAB — GLUCOSE, CAPILLARY
Glucose-Capillary: 292 mg/dL — ABNORMAL HIGH (ref 65–99)
Glucose-Capillary: 145 mg/dL — ABNORMAL HIGH (ref 65–99)
Glucose-Capillary: 336 mg/dL — ABNORMAL HIGH (ref 65–99)
Glucose-Capillary: 234 mg/dL — ABNORMAL HIGH (ref 65–99)

## 2017-03-06 LAB — MAGNESIUM: Magnesium: 1.6 mg/dL — ABNORMAL LOW (ref 1.7–2.4)

## 2017-03-06 MED ORDER — SODIUM CHLORIDE 0.9 % IV SOLN
INTRAVENOUS | Status: DC
  Administered 2017-03-06: 04:00:00 via INTRAVENOUS
  Administered 2017-03-06: 14:00:00 100 mL/h via INTRAVENOUS
  Administered 2017-03-07: 01:00:00 via INTRAVENOUS

## 2017-03-06 MED ORDER — INSULIN ASPART 100 UNIT/ML ~~LOC~~ SOLN
0.0000 [IU] | Freq: Every day | SUBCUTANEOUS | Status: DC
  Administered 2017-03-06: 22:00:00 2 [IU] via SUBCUTANEOUS
  Filled 2017-03-06: qty 1

## 2017-03-06 MED ORDER — ONDANSETRON HCL 4 MG PO TABS: 4.0000 mg | ORAL_TABLET | Freq: Four times a day (QID) | ORAL | Status: DC | PRN

## 2017-03-06 MED ORDER — ENOXAPARIN SODIUM 40 MG/0.4ML ~~LOC~~ SOLN
40.0000 mg | SUBCUTANEOUS | Status: DC
  Filled 2017-03-06: qty 0.4

## 2017-03-06 MED ORDER — MAGNESIUM SULFATE 2 GM/50ML IV SOLN
2.0000 g | Freq: Once | INTRAVENOUS | Status: AC
  Administered 2017-03-06: 2 g via INTRAVENOUS
  Filled 2017-03-06: qty 50

## 2017-03-06 MED ORDER — POTASSIUM CHLORIDE CRYS ER 20 MEQ PO TBCR: 20.0000 meq | EXTENDED_RELEASE_TABLET | Freq: Every day | ORAL | Status: DC

## 2017-03-06 MED ORDER — METOCLOPRAMIDE HCL 5 MG/ML IJ SOLN
10.0000 mg | Freq: Three times a day (TID) | INTRAMUSCULAR | Status: DC
  Administered 2017-03-06 – 2017-03-07 (×4): 10 mg via INTRAVENOUS
  Filled 2017-03-06 (×4): qty 2

## 2017-03-06 MED ORDER — OXYCODONE HCL 5 MG PO TABS
5.0000 mg | ORAL_TABLET | Freq: Once | ORAL | Status: AC
  Administered 2017-03-06: 5 mg via ORAL
  Filled 2017-03-06: qty 1

## 2017-03-06 MED ORDER — LEVETIRACETAM 750 MG PO TABS
750.0000 mg | ORAL_TABLET | Freq: Two times a day (BID) | ORAL | Status: DC
  Administered 2017-03-06 (×2): 750 mg via ORAL
  Filled 2017-03-06 (×4): qty 1

## 2017-03-06 MED ORDER — PROCHLORPERAZINE EDISYLATE 5 MG/ML IJ SOLN
10.0000 mg | Freq: Four times a day (QID) | INTRAMUSCULAR | Status: DC | PRN
  Filled 2017-03-06: qty 2

## 2017-03-06 MED ORDER — KETOROLAC TROMETHAMINE 30 MG/ML IJ SOLN
30.0000 mg | Freq: Once | INTRAMUSCULAR | Status: AC
  Administered 2017-03-06: 30 mg via INTRAVENOUS
  Filled 2017-03-06: qty 1

## 2017-03-06 MED ORDER — PROMETHAZINE HCL 25 MG RE SUPP: 25.0000 mg | Freq: Four times a day (QID) | RECTAL | Status: DC | PRN

## 2017-03-06 MED ORDER — ONDANSETRON HCL 4 MG/2ML IJ SOLN: 4.0000 mg | Freq: Four times a day (QID) | INTRAMUSCULAR | Status: DC | PRN

## 2017-03-06 MED ORDER — SENNA 8.6 MG PO TABS: 2.0000 | ORAL_TABLET | Freq: Every evening | ORAL | Status: DC | PRN

## 2017-03-06 MED ORDER — POTASSIUM CHLORIDE 20 MEQ PO PACK
40.0000 meq | PACK | Freq: Once | ORAL | Status: AC
  Administered 2017-03-07: 40 meq via ORAL
  Filled 2017-03-06: qty 2

## 2017-03-06 MED ORDER — INSULIN ASPART 100 UNIT/ML ~~LOC~~ SOLN
0.0000 [IU] | Freq: Three times a day (TID) | SUBCUTANEOUS | Status: DC
  Administered 2017-03-06: 09:00:00 5 [IU] via SUBCUTANEOUS
  Administered 2017-03-06: 7 [IU] via SUBCUTANEOUS
  Administered 2017-03-06: 13:00:00 1 [IU] via SUBCUTANEOUS
  Administered 2017-03-07: 3 [IU] via SUBCUTANEOUS
  Filled 2017-03-06 (×3): qty 1

## 2017-03-06 MED ORDER — PANTOPRAZOLE SODIUM 40 MG PO TBEC
40.0000 mg | DELAYED_RELEASE_TABLET | Freq: Every day | ORAL | Status: DC
Start: 2017-03-06 — End: 2017-03-07
  Administered 2017-03-06 – 2017-03-07 (×2): 40 mg via ORAL
  Filled 2017-03-06 (×2): qty 1

## 2017-03-06 MED ORDER — POTASSIUM CHLORIDE CRYS ER 20 MEQ PO TBCR
40.0000 meq | EXTENDED_RELEASE_TABLET | Freq: Once | ORAL | Status: AC
  Administered 2017-03-06: 40 meq via ORAL
  Filled 2017-03-06: qty 2

## 2017-03-06 MED ORDER — POTASSIUM CHLORIDE 20 MEQ PO PACK
40.0000 meq | PACK | Freq: Two times a day (BID) | ORAL | Status: DC
  Administered 2017-03-06 – 2017-03-07 (×3): 40 meq via ORAL
  Filled 2017-03-06 (×3): qty 2

## 2017-03-06 MED ORDER — POTASSIUM CHLORIDE 10 MEQ/100ML IV SOLN
10.0000 meq | INTRAVENOUS | Status: DC
  Administered 2017-03-06 (×2): 10 meq via INTRAVENOUS
  Filled 2017-03-06 (×6): qty 100

## 2017-03-06 NOTE — Progress Notes (Signed)
1730 Good day. Tolerating change in diet. [email or web address] about proper diiabetic control. Discussed long term health  problems of diabetic non-compliance.Talked about diet as well.

## 2017-03-06 NOTE — ED Notes (Signed)
Lab called to check on CMP, will process

## 2017-03-06 NOTE — Progress Notes (Addendum)
Pt unable to tolerate potasium IV. Y-ed into NS and decreased rate but pt still complaining it burns too much.  Stopped potasium and called Dr Marcille Blanco.  He stated he will dc potasium IV and write new orders. Dorna Bloom RN

## 2017-03-06 NOTE — H&P (Signed)
Rachael Gray is an 36 y.o. female.   Chief Complaint: Seizures HPI: The patient with past medical history of diabetes, lupus and seizures as well as pseudoseizures presents to the emergency department after reported seizure activity.  The patient did not have any seizure activity while in the emergency department.  However she was found to have a severely low potassium and intractable nausea and vomiting.  The patient states that she has not been able to hold any thing down for at least a week.  She was observed in the emergency department and given intravenous fluid which helped her feel better but she continued to have abdominal pain as well as some dry heaves which prompted the emergency department staff to call the hospitalist service for admission.  Past Medical History:  Diagnosis Date  . Collagen vascular disease ([place])   . Diabetes mellitus without complication ([place])   . Gastroparesis   . Lupus   . Pseudoseizures   . Seizures ([place])     Past Surgical History:  Procedure Laterality Date  . APPENDECTOMY    . CHOLECYSTECTOMY      Family History  Problem Relation Age of Onset  . Breast cancer Maternal Grandmother    Social History:  reports that she has been smoking cigarettes.  She has a 5.00 pack-year smoking history. she has never used smokeless tobacco. She reports that she uses drugs. Drug: Marijuana. She reports that she does not drink alcohol.  Allergies:  Allergies  Allergen Reactions  . Dilantin [Phenytoin Sodium Extended] Hives  . Doxycycline Swelling  . Other   . Morphine And Related Rash  . Tylenol [Acetaminophen] Rash    Medications Prior to Admission  Medication Sig Dispense Refill  . [facility] SENNA 8.6 MG tablet Take 2 tablets by mouth at bedtime as needed for constipation.  0  . HYDROcodone-acetaminophen (NORCO) 5-325 MG tablet Take 2 tablets by mouth every 6 (six) hours as needed for moderate pain. (Patient not taking: Reported on 02/22/2017) 6 tablet 0  .  insulin NPH-regular Human (NOVOLIN 70/30) (70-30) 100 UNIT/ML injection Inject 22-25 Units into the skin 2 (two) times daily with a meal. 25 units in the morning and 22 units in the evening (Patient not taking: Reported on 06/09/2016) 10 mL 2  . levETIRAcetam (KEPPRA) 750 MG tablet Take 1 tablet (750 mg total) by mouth 2 (two) times daily. (Patient not taking: Reported on 06/09/2016) 60 tablet 0  . metFORMIN (GLUCOPHAGE) 500 MG tablet Take 500 mg by mouth daily.  0  . metoCLOPramide (REGLAN) 10 MG tablet Take 1 tablet (10 mg total) by mouth every 8 (eight) hours as needed. 20 tablet 0  . ondansetron (ZOFRAN ODT) 4 MG disintegrating tablet Take 1 tablet (4 mg total) by mouth every 6 (six) hours as needed for nausea or vomiting. 20 tablet 0  . potassium chloride (K-DUR) 10 MEQ tablet Take 1 tablet (10 mEq total) by mouth daily. 12 tablet 0  . promethazine (PHENERGAN) 25 MG suppository Place 1 suppository (25 mg total) rectally every 6 (six) hours as needed for nausea. 12 suppository 1    Results for orders placed or performed during the hospital encounter of 03/05/17 (from the past 48 hour(s))  CBC - if new onset seizures     Status: None   Collection Time: 03/05/17 10:07 PM  Result Value Ref Range   WBC 6.9 3.6 - 11.0 K/uL   RBC 4.92 3.80 - 5.20 MIL/uL   Hemoglobin 14.0 12.0 - 16.0  g/dL   HCT 41.8 35.0 - 47.0 %   MCV 85.0 80.0 - 100.0 fL   MCH 28.4 26.0 - 34.0 pg   MCHC 33.4 32.0 - 36.0 g/dL   RDW 12.9 11.5 - 14.5 %   Platelets 192 150 - 440 K/uL    Comment: PLATELET CLUMPS NOTED ON SMEAR, COUNT APPEARS ADEQUATE  Comprehensive metabolic panel     Status: Abnormal   Collection Time: 03/05/17 10:07 PM  Result Value Ref Range   Sodium 136 135 - 145 mmol/L   Potassium 2.3 (LL) 3.5 - 5.1 mmol/L    Comment: CRITICAL RESULT CALLED TO, READ BACK BY AND VERIFIED WITH JENNIFER DAILY ON 03/06/17 AT 0100 JAG    Chloride 90 (L) 101 - 111 mmol/L   CO2 33 (H) 22 - 32 mmol/L   Glucose, Bld 312 (H) 65 -  99 mg/dL   BUN 5 (L) 6 - 20 mg/dL   Creatinine, Ser 0.54 0.44 - 1.00 mg/dL   Calcium 9.5 8.9 - 10.3 mg/dL   Total Protein 7.4 6.5 - 8.1 g/dL   Albumin 3.9 3.5 - 5.0 g/dL   AST 16 15 - 41 U/L   ALT 11 (L) 14 - 54 U/L   Alkaline Phosphatase 40 38 - 126 U/L   Total Bilirubin 0.8 0.3 - 1.2 mg/dL   GFR calc non Af Amer >60 >60 mL/min   GFR calc Af Amer >60 >60 mL/min    Comment: (NOTE) The eGFR has been calculated using the CKD EPI equation. This calculation has not been validated in all clinical situations. eGFR's persistently <60 mL/min signify possible Chronic Kidney Disease.    Anion gap 13 5 - 15  TSH     Status: None   Collection Time: 03/05/17 10:07 PM  Result Value Ref Range   TSH 0.839 0.350 - 4.500 uIU/mL    Comment: Performed by a 3rd Generation assay with a functional sensitivity of <=0.01 uIU/mL.  Glucose, capillary     Status: Abnormal   Collection Time: 03/05/17 10:15 PM  Result Value Ref Range   Glucose-Capillary 303 (H) 65 - 99 mg/dL   No results found.  Review of Systems  Constitutional: Negative for chills and fever.  HENT: Negative for sore throat and tinnitus.   Eyes: Negative for blurred vision and redness.  Respiratory: Negative for cough and shortness of breath.   Cardiovascular: Negative for chest pain, palpitations, orthopnea and PND.  Gastrointestinal: Positive for abdominal pain, nausea and vomiting. Negative for diarrhea.  Genitourinary: Negative for dysuria, frequency and urgency.  Musculoskeletal: Negative for joint pain and myalgias.  Skin: Negative for rash.       No lesions  Neurological: Negative for speech change, focal weakness and weakness.  Endo/Heme/Allergies: Does not bruise/bleed easily.       No temperature intolerance  Psychiatric/Behavioral: Negative for depression and suicidal ideas.    Blood pressure 122/74, pulse 95, temperature 97.7 F (36.5 C), temperature source Oral, resp. rate 17, height _0  (1.651 m), weight 54.9 kg  (121 lb), last menstrual period 03/02/2017, SpO2 99 %. Physical Exam  Vitals reviewed. Constitutional: She is oriented to person, place, and time. She appears well-developed and well-nourished. No distress.  HENT:  Head: Normocephalic and atraumatic.  Mouth/Throat: Oropharynx is clear and moist.  Eyes: Conjunctivae and EOM are normal. Pupils are equal, round, and reactive to light. No scleral icterus.  Neck: Normal range of motion. Neck supple. No JVD present. No tracheal deviation present. No thyromegaly  present.  Cardiovascular: Normal rate, regular rhythm and normal heart sounds. Exam reveals no gallop and no friction rub.  No murmur heard. Respiratory: Effort normal and breath sounds normal.  GI: Soft. Bowel sounds are normal. She exhibits no distension. There is no tenderness.  Genitourinary:  Genitourinary Comments: Deferred  Musculoskeletal: Normal range of motion. She exhibits no edema.  Lymphadenopathy:    She has no cervical adenopathy.  Neurological: She is alert and oriented to person, place, and time. No cranial nerve deficit. She exhibits normal muscle tone.  Skin: Skin is warm and dry. No rash noted. No erythema.  Psychiatric: She has a normal mood and affect. Her behavior is normal. Judgment and thought content normal.     Assessment/Plan This is a 37 year old female admitted for intractable nausea and vomiting. 1.  Nausea and vomiting: Intractable; secondary to gastroparesis.  Reglan for motility.  Continue antiemetics.  Compazine for refractory nausea and vomiting.  Hydrate with intravenous fluid. 2.  Hypokalemia: No arrhythmias.  Supplement potassium intravenously and per oral. 3.  Diabetes mellitus type 2: Uncontrolled; hold mixed insulin and metformin.  Sliding scale fast acting insulin while hospitalized and basal insulin 4.  Epilepsy: The patient reportedly had some seizures prior to admission.  She has no seizure activity while in the emergency department and she  is not postictal.  Continue Keppra 5.  DVT prophylaxis: Lovenox 6.  GI prophylaxis: H2 blocker as needed The patient is a full code.  Time spent on admission orders and patient care approximately 45 minutes  Harrie Foreman, MD 03/06/2017, 7:27 AM

## 2017-03-06 NOTE — Progress Notes (Signed)
Inpatient Diabetes Program Recommendations  AACE/ADA: New Consensus Statement on Inpatient Glycemic Control (2015)  Target Ranges:  Prepandial:   less than 140 mg/dL      Peak postprandial:   less than 180 mg/dL (1-2 hours)      Critically ill patients:  140 - 180 mg/dL   Lab Results  Component Value Date   GLUCAP 292 (H) 03/06/2017   HGBA1C 10.7 (H) 12/04/2014    Review of Glycemic Control  Results for Rachael Gray, Rachael Gray (MRN [id number]) as of 03/06/2017 09:14  Ref. Range 03/05/2017 22:15 03/06/2017 07:27  Glucose-Capillary Latest Ref Range: 65 - 99 mg/dL 303 (H) 292 (H)    Outpatient Diabetes medications: NPH 70/30 25 units qam, 22units qpm  Current orders for Inpatient glycemic control: Novolog 0-9 units TID with meals, Novolog 0-5 units QHS  Inpatient Diabetes Program Recommendations: Fasting glucose 292 mg/dl this morning. Please consider ordering low dose basal insulin. Recommend starting with Lantus 6 units Q24H starting now.  Gentry Fitz, RN, BA, MHA, CDE Diabetes Coordinator Inpatient Diabetes Program  [phone] (Team Pager) [phone] ([place]) 03/06/2017 9:17 AM

## 2017-03-06 NOTE — Progress Notes (Signed)
admitted this morning for intractable nausea, vomiting, hypokalemia.  He says she feels better, less nausea, wants to eat and she feels hungry. Vitals: Temperature 98.80F, blood pressure 117 /85 Heart rate 101 Saturation 96% on room air. Physical examination; alert, awake, oriented Cardiovascular: S1, S2 regular. Lungs: Clear to auscultation, GI ;: Abdomen soft, nontender, nondistended, bowel sounds present  Assessment and plan: #1 intractable nausea, vomiting for almost 1-1/2 months but she did not seek medical attention.  Now patient is on Zofran,Compazine.  And nausea is much better.  Continue aggressive hydration with potassium supplementation, she is on clear liquids and she is willing to try soft diet for lunch.  Same discussed with nurse also. 2.  Hypokalemia secondary to GI losses.  Potassium being replaced. 3 diabetic gastroparesis.  Patient is on Reglan 10 mg every 8 hours.. 4 .diabetes mellitus type 2: Diabetes coronary recommended Lantus 6 units every 24 hours, NovoLog 0-9 units 3 times daily with meals, 0-5 units nightly. Likely discharge tomorrow.  Time spent ;25 minutes.

## 2017-03-06 NOTE — ED Notes (Addendum)
Dr Owens Shark aware of Potassium level

## 2017-03-07 LAB — BASIC METABOLIC PANEL
Anion gap: 6 (ref 5–15)
CO2: 28 mmol/L (ref 22–32)
Calcium: 8.3 mg/dL — ABNORMAL LOW (ref 8.9–10.3)
Chloride: 102 mmol/L (ref 101–111)
Creatinine, Ser: 0.44 mg/dL (ref 0.44–1.00)
Glucose, Bld: 263 mg/dL — ABNORMAL HIGH (ref 65–99)
POTASSIUM: 3 mmol/L — AB (ref 3.5–5.1)
SODIUM: 136 mmol/L (ref 135–145)

## 2017-03-07 LAB — POTASSIUM: POTASSIUM: 3.5 mmol/L (ref 3.5–5.1)

## 2017-03-07 LAB — GLUCOSE, CAPILLARY: Glucose-Capillary: 229 mg/dL — ABNORMAL HIGH (ref 65–99)

## 2017-03-07 LAB — MAGNESIUM: Magnesium: 1.7 mg/dL (ref 1.7–2.4)

## 2017-03-07 MED ORDER — POTASSIUM CHLORIDE ER 10 MEQ PO TBCR: 10.0000 meq | EXTENDED_RELEASE_TABLET | Freq: Every day | ORAL | 0 refills | Status: DC

## 2017-03-07 MED ORDER — INSULIN ASPART 100 UNIT/ML ~~LOC~~ SOLN: 0.0000 [IU] | Freq: Every day | SUBCUTANEOUS | 11 refills | Status: DC

## 2017-03-07 MED ORDER — INSULIN NPH ISOPHANE & REGULAR (70-30) 100 UNIT/ML ~~LOC~~ SUSP: 22.0000 [IU] | Freq: Two times a day (BID) | SUBCUTANEOUS | 2 refills | Status: DC

## 2017-03-07 NOTE — Progress Notes (Signed)
Discharge instructions reviewed with patient. Follow up appointment and prescriptions reviewed. IV removed without complications.

## 2017-03-07 NOTE — Progress Notes (Signed)
Inpatient Diabetes Program Recommendations  AACE/ADA: New Consensus Statement on Inpatient Glycemic Control (2015)  Target Ranges:  Prepandial:   less than 140 mg/dL      Peak postprandial:   less than 180 mg/dL (1-2 hours)      Critically ill patients:  140 - 180 mg/dL   Results for ONESHA, KREBBS (MRN [id number]) as of 03/07/2017 08:28  Ref. Range 03/06/2017 07:27 03/06/2017 11:38 03/06/2017 16:35 03/06/2017 21:28  Glucose-Capillary Latest Ref Range: 65 - 99 mg/dL 292 (H) 145 (H) 336 (H) 234 (H)   Results for ALLEYNE, LAC (MRN [id number]) as of 03/07/2017 08:28  Ref. Range 03/07/2017 07:35  Glucose-Capillary Latest Ref Range: 65 - 99 mg/dL 229 (H)    Home DM Meds: 70/30 Insulin- 25 units AM/ 22 units PM       Metformin 1000 mg BID  Current Insulin Orders: Novolog Sensitive Correction Scale/ SSI (0-9 units) TID AC + HS     MD- Patient now eating PO diet and tolerating.  CBGs elevated.  Please consider starting a portion of pt's home dose of 70/30 Insulin:  Recommend 70/30 Insulin- 10 units BID to start (~50% total home dose)       --Will follow patient during hospitalization--  Wyn Quaker RN, MSN, CDE Diabetes Coordinator Inpatient Glycemic Control Team Team Pager: [phone] (8a-5p)

## 2017-03-07 NOTE — Discharge Summary (Signed)
Rachael Gray, is a 36 y.o. female  DOB [birth year]  MRN [id number].  Admission date:  03/05/2017  Admitting Physician  Harrie Foreman, MD  Discharge Date:  03/07/2017   Primary MD  Guadalupe Maple, MD  Recommendations for primary care physician for things to follow:   Follow-up with Dr. Golden Pop in 1 week   Admission Diagnosis  Gastroparesis [K31.84] Hypokalemia [E87.6] Seizure-like activity ([place]) [R56.9]   Discharge Diagnosis  Gastroparesis [K31.84] Hypokalemia [E87.6] Seizure-like activity ([place]) [R56.9]    Active Problems:   Intractable nausea and vomiting      Past Medical History:  Diagnosis Date  . Collagen vascular disease ([place])   . Diabetes mellitus without complication ([place])   . Gastroparesis   . Lupus   . Pseudoseizures   . Seizures ([place])     Past Surgical History:  Procedure Laterality Date  . APPENDECTOMY    . CHOLECYSTECTOMY         History of present illness and  Hospital Course:     Kindly see H&P for history of present illness and admission details, please review complete Labs, Consult reports and Test reports for all details in brief  HPI  from the history and physical done on the day of admission 37 year old female patient with history of diabetes mellitus type 2, lupus, seizure disorder came in because of intractable nausea, vomiting and found to have low potassium.  Patient unable to keep anything down for 1 week.  Patient had multiple visits to the emergency room, came to ER 14 times in the last 6 months.  Patient did not see PCP for years and has history of noncompliance.   Hospital Course  Intractable nausea, vomiting secondary to diabetic gastroparesis.  Patient received Reglan, IV fluids, antiemetics with Compazine, Zofran and admitted to observation status.   Patient received IV fluids also.  She felt much better with IV fluids, IV Zofran and along with Reglan.  Started on clear liquids and advance to regular food.  History of noncompliance with diabetic medication, patient stopped taking her 7030 insulin long time ago due to insurance problems.  She is still on metformin and she says she takes every day.  Diabetic nurse saw the patient and recommended to continue 70/30 insulin 25 units in the morning 22 units in the evening and also metformin 1 g twice daily.  I wrote a prescription for insulin.  asked case manager to help with her medicines.  #2.  Severe hypokalemia due to GI losses: Patient received IV fluids with potassium supplements, potassium was 2.6 yesterday and diabetes 3 today discharged home with KCl 20 a MICU for 5 days. 3.  History of diabetic gastroparesis: Patient has Reglan at home advised her to continue that. 4.  Tobacco abuse: Counseled to quit.  History of seizures: Patient is on Keppra. Discharge Condition: Stable   Follow UP  Follow-up Information    Crissman, Jeannette How, MD. Schedule an appointment as soon as possible for a visit today.   Specialty:  Family Medicine Contact information: [address] [place] [address] [phone]             Discharge Instructions  and  Discharge Medications      Allergies as of 03/07/2017      Reactions   Dilantin [phenytoin Sodium Extended] Hives   Doxycycline Swelling   Other    Morphine And Related Rash   Tylenol [acetaminophen] Rash      Medication List  STOP taking these medications   HYDROcodone-acetaminophen 5-325 MG tablet Commonly known as:  NORCO   promethazine 25 MG suppository Commonly known as:  PHENERGAN     TAKE these medications   insulin aspart 100 UNIT/ML injection Commonly known as:  novoLOG Inject 0-5 Units into the skin at bedtime.   insulin NPH-regular Human (70-30) 100 UNIT/ML injection Commonly known as:  NOVOLIN 70/30 Inject  22-25 Units into the skin 2 (two) times daily with a meal. 25 units in the morning and 22 units in the evening   levETIRAcetam 750 MG tablet Commonly known as:  KEPPRA Take 1 tablet (750 mg total) by mouth 2 (two) times daily.   metFORMIN 500 MG tablet Commonly known as:  GLUCOPHAGE Take 1,000 mg by mouth 2 (two) times daily with a meal.   metoCLOPramide 10 MG tablet Commonly known as:  REGLAN Take 1 tablet (10 mg total) by mouth every 8 (eight) hours as needed.   ondansetron 4 MG disintegrating tablet Commonly known as:  ZOFRAN ODT Take 1 tablet (4 mg total) by mouth every 6 (six) hours as needed for nausea or vomiting.   potassium chloride 10 MEQ tablet Commonly known as:  K-DUR Take 1 tablet (10 mEq total) by mouth daily.         Diet and Activity recommendation: See Discharge Instructions above   Consults obtained -diabetic nurse is   Major procedures and Radiology Reports - PLEASE review detailed and final reports for all details, in brief -      Dg Chest 2 View  Result Date: 03/02/2017 CLINICAL DATA:  Chest pain and shortness of breath EXAM: CHEST  2 VIEW COMPARISON:  December 16, 2015 FINDINGS: The lungs are clear. Heart size and pulmonary vascularity are normal. No adenopathy. No bone lesions. IMPRESSION: No edema or consolidation. Electronically Signed   By: Lowella Grip III M.D.   On: 03/02/2017 16:11   Ct Angio Chest Pe W And/or Wo Contrast  Result Date: 03/02/2017 CLINICAL DATA:  Chest pain and shortness of breath EXAM: CT ANGIOGRAPHY CHEST WITH CONTRAST TECHNIQUE: Multidetector CT imaging of the chest was performed using the standard protocol during bolus administration of intravenous contrast. Multiplanar CT image reconstructions and MIPs were obtained to evaluate the vascular anatomy. CONTRAST:  59mL ISOVUE-370 IOPAMIDOL (ISOVUE-370) INJECTION 76% COMPARISON:  Chest radiograph March 02, 2017 FINDINGS: Cardiovascular: There is no demonstrable  pulmonary embolus. There is no thoracic aortic aneurysm or dissection. Visualized great vessels appear normal. There is no pericardial effusion or pericardial thickening. Mediastinum/Nodes: Visualized thyroid appears unremarkable. There is no appreciable thoracic adenopathy. No esophageal lesions are appreciable. Lungs/Pleura: Lungs are clear. No pleural effusion or pleural thickening evident. Upper Abdomen: Visualized upper abdominal structures appear unremarkable. Musculoskeletal: There are no blastic or lytic bone lesions. Review of the MIP images confirms the above findings. IMPRESSION: 1.  No demonstrable pulmonary embolus. 2.  Lungs clear. 3.  No appreciable adenopathy. Electronically Signed   By: Lowella Grip III M.D.   On: 03/02/2017 17:26   Ct Abdomen Pelvis W Contrast  Result Date: 02/22/2017 CLINICAL DATA:  Nausea, vomiting. EXAM: CT ABDOMEN AND PELVIS WITH CONTRAST TECHNIQUE: Multidetector CT imaging of the abdomen and pelvis was performed using the standard protocol following bolus administration of intravenous contrast. CONTRAST:  151mL ISOVUE-300 IOPAMIDOL (ISOVUE-300) INJECTION 61% COMPARISON:  01/02/2017 FINDINGS: Lower chest: Lung bases are clear. No effusions. Heart is normal size. Hepatobiliary: No focal liver abnormality is seen. Status post cholecystectomy. No biliary dilatation. Pancreas:  No focal abnormality or ductal dilatation. Spleen: No focal abnormality.  Normal size. Adrenals/Urinary Tract: No adrenal abnormality. No focal renal abnormality. No stones or hydronephrosis. Urinary bladder is unremarkable. Stomach/Bowel: Stomach, large and small bowel grossly unremarkable. Vascular/Lymphatic: Scattered aortic and iliac calcifications. No evidence of aneurysm or adenopathy. Reproductive: Uterus and adnexa unremarkable. No mass. Collapsing left ovarian follicle noted. Other: Small amount of free fluid in the pelvis.  No free air. Musculoskeletal: No acute bony abnormality or focal  bone lesion. IMPRESSION: No acute findings in the abdomen or pelvis. Electronically Signed   By: Rolm Baptise M.D.   On: 02/22/2017 11:39    Micro Results    No results found for this or any previous visit (from the past 240 hour(s)).     Today   Subjective:   Rachael Gray today has no headache,no chest abdominal pain,no new weakness tingling or numbness, feels much better wants to go home today.   Objective:   Blood pressure 116/65, pulse 92, temperature 98 F (36.7 C), temperature source Oral, resp. rate 18, height 5\' 5"  (1.651 m), weight 56.5 kg (124 lb 8 oz), last menstrual period 03/02/2017, SpO2 98 %.   Intake/Output Summary (Last 24 hours) at 03/07/2017 1130 Last data filed at 03/07/2017 0600 Gross per 24 hour  Intake 3925 ml  Output 500 ml  Net 3425 ml    Exam Awake Alert, Oriented x 3, No new F.N deficits, Normal affect [place].AT,PERRAL Supple Neck,No JVD, No cervical lymphadenopathy appriciated.  Symmetrical Chest wall movement, Good air movement bilaterally, CTAB RRR,No Gallops,Rubs or new Murmurs, No Parasternal Heave +ve B.Sounds, Abd Soft, Non tender, No organomegaly appriciated, No rebound -guarding or rigidity. No Cyanosis, Clubbing or edema, No new Rash or bruise  Data Review   CBC w Diff:  Lab Results  Component Value Date   WBC 6.9 03/05/2017   HGB 14.0 03/05/2017   HGB 12.2 06/18/2014   HCT 41.8 03/05/2017   HCT 37.3 06/18/2014   PLT 192 03/05/2017   PLT 170 06/18/2014   LYMPHOPCT 13 12/05/2016   LYMPHOPCT 45.3 06/18/2014   MONOPCT 5 12/05/2016   MONOPCT 9.1 06/18/2014   EOSPCT 1 12/05/2016   EOSPCT 1.5 06/18/2014   BASOPCT 1 12/05/2016   BASOPCT 1.2 06/18/2014    CMP:  Lab Results  Component Value Date   NA 136 03/07/2017   NA 138 06/18/2014   K 3.0 (L) 03/07/2017   K 3.4 (L) 06/18/2014   CL 102 03/07/2017   CL 106 06/18/2014   CO2 28 03/07/2017   CO2 25 06/18/2014   BUN <5 (L) 03/07/2017   BUN 6 06/18/2014   CREATININE  0.44 03/07/2017   CREATININE 0.42 (L) 06/18/2014   PROT 7.4 03/05/2017   PROT 7.1 06/16/2014   ALBUMIN 3.9 03/05/2017   ALBUMIN 4.0 06/16/2014   BILITOT 0.8 03/05/2017   BILITOT 0.8 06/16/2014   ALKPHOS 40 03/05/2017   ALKPHOS 38 06/16/2014   AST 16 03/05/2017   AST 14 (L) 06/16/2014   ALT 11 (L) 03/05/2017   ALT 10 (L) 06/16/2014  .   Total Time in preparing paper work, data evaluation and todays exam - 35 minutes  Epifanio Lesches M.D on 03/07/2017 at 11:30 AM    Note: This dictation was prepared with Dragon dictation along with smaller phrase technology. Any transcriptional errors that result from this process are unintentional.

## 2017-03-07 NOTE — Care Management (Signed)
Spoke with Dr. Dian Queen this morning, States that [name] indicated that her medicaid was not effective anymore. Went to room to talk to [name] about her medicaid and give information about Medication Management and [facility].  [name] was discharged and left her room. Shelbie Ammons RN MSN CCM Care Management [phone]

## 2017-03-10 ENCOUNTER — Ambulatory Visit: Payer: Medicaid Other | Admitting: Family Medicine

## 2017-03-13 ENCOUNTER — Ambulatory Visit: Payer: Medicaid Other | Admitting: Family Medicine

## 2017-04-28 ENCOUNTER — Emergency Department
Admission: EM | Admit: 2017-04-28 | Discharge: 2017-04-28 | Disposition: A | Discharge status: Home / Self Care | Payer: Medicaid Other | Attending: Emergency Medicine | Admitting: Emergency Medicine

## 2017-04-28 DIAGNOSIS — Z79899 Other long term (current) drug therapy: Secondary | ICD-10-CM | POA: Insufficient documentation

## 2017-04-28 DIAGNOSIS — E119 Type 2 diabetes mellitus without complications: Secondary | ICD-10-CM | POA: Insufficient documentation

## 2017-04-28 DIAGNOSIS — R1084 Generalized abdominal pain: Secondary | ICD-10-CM | POA: Diagnosis present

## 2017-04-28 DIAGNOSIS — F17210 Nicotine dependence, cigarettes, uncomplicated: Secondary | ICD-10-CM | POA: Diagnosis not present

## 2017-04-28 DIAGNOSIS — E860 Dehydration: Secondary | ICD-10-CM

## 2017-04-28 DIAGNOSIS — Z794 Long term (current) use of insulin: Secondary | ICD-10-CM | POA: Insufficient documentation

## 2017-04-28 DIAGNOSIS — K3184 Gastroparesis: Secondary | ICD-10-CM

## 2017-04-28 LAB — COMPREHENSIVE METABOLIC PANEL
ALBUMIN: 5.2 g/dL — AB (ref 3.5–5.0)
ALK PHOS: 47 U/L (ref 38–126)
ALT: 11 U/L — AB (ref 14–54)
ANION GAP: 16 — AB (ref 5–15)
AST: 24 U/L (ref 15–41)
BUN: 12 mg/dL (ref 6–20)
CALCIUM: 10.1 mg/dL (ref 8.9–10.3)
CO2: 24 mmol/L (ref 22–32)
Chloride: 98 mmol/L — ABNORMAL LOW (ref 101–111)
Creatinine, Ser: 0.63 mg/dL (ref 0.44–1.00)
GFR calc Af Amer: >60 mL/min (ref >60)
GFR calc non Af Amer: >60 mL/min (ref >60)
GLUCOSE: 243 mg/dL — AB (ref 65–99)
Potassium: 3.3 mmol/L — ABNORMAL LOW (ref 3.5–5.1)
SODIUM: 138 mmol/L (ref 135–145)
Total Bilirubin: 0.9 mg/dL (ref 0.3–1.2)
Total Protein: 9.1 g/dL — ABNORMAL HIGH (ref 6.5–8.1)

## 2017-04-28 LAB — CBC WITH DIFFERENTIAL/PLATELET
BASOS ABS: 0 10*3/uL (ref 0–0.1)
BASOS PCT: 1 %
EOS ABS: 0 10*3/uL (ref 0–0.7)
Eosinophils Relative: 1 %
HCT: 45.5 % (ref 35.0–47.0)
HEMOGLOBIN: 15 g/dL (ref 12.0–16.0)
Lymphocytes Relative: 34 %
Lymphs Abs: 2.5 10*3/uL (ref 1.0–3.6)
MCH: 28.3 pg (ref 26.0–34.0)
MCHC: 32.9 g/dL (ref 32.0–36.0)
MCV: 86.3 fL (ref 80.0–100.0)
Monocytes Absolute: 0.6 10*3/uL (ref 0.2–0.9)
Monocytes Relative: 8 %
NEUTROS PCT: 56 %
Neutro Abs: 4.2 10*3/uL (ref 1.4–6.5)
Platelets: 207 10*3/uL (ref 150–440)
RBC: 5.28 MIL/uL — AB (ref 3.80–5.20)
RDW: 14 % (ref 11.5–14.5)
WBC: 7.4 10*3/uL (ref 3.6–11.0)

## 2017-04-28 LAB — HCG, QUANTITATIVE, PREGNANCY

## 2017-04-28 MED ORDER — ONDANSETRON HCL 4 MG/2ML IJ SOLN
4.0000 mg | Freq: Once | INTRAMUSCULAR | Status: AC
  Administered 2017-04-28: 4 mg via INTRAVENOUS
  Filled 2017-04-28: qty 2

## 2017-04-28 MED ORDER — HALOPERIDOL LACTATE 5 MG/ML IJ SOLN
2.0000 mg | Freq: Once | INTRAMUSCULAR | Status: DC
  Filled 2017-04-28: qty 1

## 2017-04-28 MED ORDER — SODIUM CHLORIDE 0.9 % IV BOLUS (SEPSIS)
1000.0000 mL | Freq: Once | INTRAVENOUS | Status: AC
  Administered 2017-04-28: 1000 mL via INTRAVENOUS

## 2017-04-28 MED ORDER — ONDANSETRON 4 MG PO TBDP: 4.0000 mg | ORAL_TABLET | Freq: Three times a day (TID) | ORAL | 0 refills | Status: DC | PRN

## 2017-04-28 NOTE — ED Notes (Signed)
Pt vomited clear liquid on floor this RN cleaned.

## 2017-04-28 NOTE — ED Notes (Signed)
Pt woke up this morning with seizure activity. Pt states she has a history of seizure and take meds for them

## 2017-04-28 NOTE — ED Provider Notes (Addendum)
[facility] Emergency Department Provider Note  ____________________________________________  Time seen: Approximately 5:35 PM  I have reviewed the triage vital signs and the nursing notes.   HISTORY  Chief Complaint Abdominal Pain   HPI Rachael Gray is a 37 y.o. female with a history of diabetes, gastroparesis, lupus, pseudoseizures, and seizures who presents for evaluation of abdominal pain, nausea and vomiting.patient reports 3 days of diffuse severe cramping abdominal pain associated with several daily episodes of nonbloody nonbilious emesis. No fever or chills, no dysuria or hematuria, no vaginal discharge, no chest pain or shortness of breath, no diarrhea or constipation. The patient started having seizure-like activity in the waiting room however was speaking to staff while this was happening telling them that she was having a seizure. LMP was 2 months ago.  Past Medical History:  Diagnosis Date  . Collagen vascular disease ([place])   . Diabetes mellitus without complication ([place])   . Gastroparesis   . Lupus   . Pseudoseizures   . Seizures [facility])     Patient Active Problem List   Diagnosis Date Noted  . Hypokalemia 12/24/2015  . Anemia 12/24/2015  . Diabetes ([place]) 12/24/2015  . Gastroparesis due to DM ([place])   . Acute respiratory alkalosis 12/22/2015  . Duodenitis 12/22/2015  . Vomiting 12/16/2015  . Intractable nausea and vomiting 12/10/2014  . Nausea & vomiting 12/10/2014  . Narcotic abuse ([place]) 12/09/2014  . Sepsis ([place]) 12/07/2014  . Anxiety   . Pyelonephritis 12/04/2014  . Lactic acidosis 12/04/2014    Class: Acute    Past Surgical History:  Procedure Laterality Date  . APPENDECTOMY    . CHOLECYSTECTOMY      Prior to Admission medications   Medication Sig Start Date End Date Taking? Authorizing Provider  insulin aspart (NOVOLOG) 100 UNIT/ML injection Inject 0-5 Units into the skin at bedtime. 03/07/17   Epifanio Lesches, MD  insulin NPH-regular Human (NOVOLIN 70/30) (70-30) 100 UNIT/ML injection Inject 22-25 Units into the skin 2 (two) times daily with a meal. 25 units in the morning and 22 units in the evening 03/07/17   Epifanio Lesches, MD  levETIRAcetam (KEPPRA) 750 MG tablet Take 1 tablet (750 mg total) by mouth 2 (two) times daily. Patient not taking: Reported on 06/09/2016 08/21/15   Paulette Blanch, MD  metFORMIN (GLUCOPHAGE) 500 MG tablet Take 1,000 mg by mouth 2 (two) times daily with a meal.  11/25/16   [provider]  metoCLOPramide (REGLAN) 10 MG tablet Take 1 tablet (10 mg total) by mouth every 8 (eight) hours as needed. Patient not taking: Reported on 03/06/2017 02/25/17   Loney Hering, MD  ondansetron (ZOFRAN ODT) 4 MG disintegrating tablet Take 1 tablet (4 mg total) by mouth every 8 (eight) hours as needed for nausea or vomiting. 04/28/17   Rudene Re, MD  potassium chloride (K-DUR) 10 MEQ tablet Take 1 tablet (10 mEq total) by mouth daily. 03/07/17   Epifanio Lesches, MD    Allergies Dilantin [phenytoin sodium extended]; Doxycycline; Other; Morphine and related; and Tylenol [acetaminophen]  Family History  Problem Relation Age of Onset  . Breast cancer Maternal Grandmother     Social History Social History   Tobacco Use  . Smoking status: Current Every Day Smoker    Packs/day: 0.50    Years: 10.00    Pack years: 5.00    Types: Cigarettes  . Smokeless tobacco: Never Used  Substance Use Topics  . Alcohol use: No  .  Drug use: Yes    Types: Marijuana    Comment: 3 months ago.     Review of Systems  Constitutional: Negative for fever. Eyes: Negative for visual changes. ENT: Negative for sore throat. Neck: No neck pain  Cardiovascular: Negative for chest pain. Respiratory: Negative for shortness of breath. Gastrointestinal: + diffuse abdominal pain, nausea, and vomiting. No diarrhea. Genitourinary: Negative for dysuria. Musculoskeletal:  Negative for back pain. Skin: Negative for rash. Neurological: Negative for headaches, weakness or numbness. Psych: No SI or HI  ____________________________________________   PHYSICAL EXAM:  VITAL SIGNS: ED Triage Vitals  Enc Vitals Group     BP --      Pulse Rate 04/28/17 1700 (!) 119     Resp 04/28/17 1700 19     Temp 04/28/17 1656 98.3 F (36.8 C)     Temp Source 04/28/17 1656 Oral     SpO2 04/28/17 1700 99 %     Weight 04/28/17 1657 125 lb (56.7 kg)     Height 04/28/17 1657 5\' 11"  (1.803 m)     Head Circumference --      Peak Flow --      Pain Score 04/28/17 1657 8     Pain Loc --      Pain Edu? --      Excl. in [place]? --     Constitutional: Alert and oriented, patient is rolling in bed screaming in pain, crying, dry heaving.initially when brought in the room was shaking all over and telling staff that she was having a seizure while helping staff to undress her HEENT:      Head: Normocephalic and atraumatic.         Eyes: Conjunctivae are normal. Sclera is non-icteric.       Mouth/Throat: Mucous membranes are dry.       Neck: Supple with no signs of meningismus. Cardiovascular: tachycardic with regular rhythm. No murmurs, gallops, or rubs. 2+ symmetrical distal pulses are present in all extremities. No JVD. Respiratory: Normal respiratory effort. Lungs are clear to auscultation bilaterally. No wheezes, crackles, or rhonchi.  Gastrointestinal: Soft, diffuse tenderness throughout, and non distended with positive bowel sounds. No rebound or guarding. Genitourinary: No CVA tenderness. Musculoskeletal: Nontender with normal range of motion in all extremities. No edema, cyanosis, or erythema of extremities. Neurologic: Normal speech and language. Face is symmetric. Moving all extremities. No gross focal neurologic deficits are appreciated. Skin: Skin is warm, dry and intact. No rash noted. Psychiatric: Mood and affect are normal. Speech and behavior are  normal.  ____________________________________________   LABS (all labs ordered are listed, but only abnormal results are displayed)  Labs Reviewed  CBC WITH DIFFERENTIAL/PLATELET - Abnormal; Notable for the following components:      Result Value   RBC 5.28 (*)    All other components within normal limits  COMPREHENSIVE METABOLIC PANEL - Abnormal; Notable for the following components:   Potassium 3.3 (*)    Chloride 98 (*)    Glucose, Bld 243 (*)    Total Protein 9.1 (*)    Albumin 5.2 (*)    ALT 11 (*)    Anion gap 16 (*)    All other components within normal limits  HCG, QUANTITATIVE, PREGNANCY  URINALYSIS, COMPLETE (UACMP) WITH MICROSCOPIC  CBG MONITORING, ED   ____________________________________________  EKG  ED ECG REPORT I, Rudene Re, the attending physician, personally viewed and interpreted this ECG.  Sinus tachycardia, rate of 108, normal intervals, normal axis, no ST elevations or  depressions. ____________________________________________  RADIOLOGY  none  ____________________________________________   PROCEDURES  Procedure(s) performed: None Procedures Critical Care performed:  None ____________________________________________   INITIAL IMPRESSION / ASSESSMENT AND PLAN / ED COURSE   37 y.o. female with a history of diabetes, gastroparesis, lupus, pseudoseizures, and seizures who presents for evaluation of abdominal pain, nausea and vomiting x 3 days. patient with generalized shaking, telling staff it was a seizure although patient was conscious during this episode. She is crying and rolling in bed. She is afebrile, slightly tachycardic, abdomen is soft with diffuse mild tenderness throughout, no rebound or guarding. Patient has had her appendix and gallbladder removed several years ago. Differential diagnoses including gastroparesis versus gastritis versus peptic ulcer disease versus ovarian pathology versus STD versus pregnancy versus UTI versus  pyelonephritis. We'll check labs, give IV fluid and Zofran for now. If pregnancy is negative we'll give her Haldol.    _________________________ 7:36 PM on 04/28/2017 ----------------------------------------- patient no longer vomiting. Reports full resolution of her symptoms after 1L NS, zofran and IV haldol. She is requesting discharge.pregnancy test negative. Initially with AG of 16. Patient continues to be tachycardic but is refusing 2nd bolus. I recommended increased PO hydration at home and will provide prescription of zofran. recommended close follow-up with primary care doctor and discussed return precautions with patient. Patient be discharged at this time.    As part of my medical decision making, I reviewed the following data within the [employer] notes reviewed and incorporated, Labs reviewed , Old chart reviewed, Notes from prior ED visits and [place] Controlled Substance Database    Pertinent labs & imaging results that were available during my care of the patient were reviewed by me and considered in my medical decision making (see chart for details).    ____________________________________________   FINAL CLINICAL IMPRESSION(S) / ED DIAGNOSES  Final diagnoses:  Gastroparesis  Dehydration      NEW MEDICATIONS STARTED DURING THIS VISIT:  ED Discharge Orders        Ordered    ondansetron (ZOFRAN ODT) 4 MG disintegrating tablet  Every 8 hours PRN     04/28/17 1935       Note:  This document was prepared using Dragon voice recognition software and may include unintentional dictation errors.    Alfred Levins, [place], MD 04/28/17 [place], [place], MD 04/28/17 [place], [place], MD 04/28/17 [phone]

## 2017-04-28 NOTE — ED Notes (Signed)
Pt family states pt tells her she has been bleeding big clots since Friday, and hurting for 2 weeks.

## 2017-04-28 NOTE — ED Notes (Signed)
Pt family states pt is [redacted] weeks pregnant. Pt denies that at this time.

## 2017-04-28 NOTE — ED Triage Notes (Signed)
Pt presents today via POV for abd pain generalized. Pt has not ate everything in 3 days. Pt presents with a "rocking motion" while on the stretcher.

## 2017-04-28 NOTE — ED Notes (Signed)
Aunt states contact pt Mother for Discharge. [place].

## 2017-04-28 NOTE — ED Notes (Signed)
This RN called lad to check status of hCG. Lab tech states 20 more mins. RN will monitor.

## 2017-04-30 ENCOUNTER — Emergency Department: Payer: Medicaid Other

## 2017-04-30 ENCOUNTER — Other Ambulatory Visit: Payer: Self-pay

## 2017-04-30 ENCOUNTER — Encounter: Payer: Self-pay | Admitting: Emergency Medicine

## 2017-04-30 ENCOUNTER — Emergency Department
Admission: EM | Admit: 2017-04-30 | Discharge: 2017-04-30 | Disposition: A | Discharge status: Home / Self Care | Payer: Medicaid Other | Attending: Emergency Medicine | Admitting: Emergency Medicine

## 2017-04-30 DIAGNOSIS — E119 Type 2 diabetes mellitus without complications: Secondary | ICD-10-CM | POA: Insufficient documentation

## 2017-04-30 DIAGNOSIS — R1084 Generalized abdominal pain: Secondary | ICD-10-CM

## 2017-04-30 DIAGNOSIS — K3184 Gastroparesis: Secondary | ICD-10-CM | POA: Insufficient documentation

## 2017-04-30 DIAGNOSIS — N938 Other specified abnormal uterine and vaginal bleeding: Secondary | ICD-10-CM

## 2017-04-30 DIAGNOSIS — F17210 Nicotine dependence, cigarettes, uncomplicated: Secondary | ICD-10-CM | POA: Diagnosis not present

## 2017-04-30 DIAGNOSIS — Z79899 Other long term (current) drug therapy: Secondary | ICD-10-CM | POA: Diagnosis not present

## 2017-04-30 DIAGNOSIS — Z794 Long term (current) use of insulin: Secondary | ICD-10-CM | POA: Diagnosis not present

## 2017-04-30 DIAGNOSIS — R109 Unspecified abdominal pain: Secondary | ICD-10-CM | POA: Diagnosis present

## 2017-04-30 DIAGNOSIS — R102 Pelvic and perineal pain: Secondary | ICD-10-CM

## 2017-04-30 LAB — LIPASE, BLOOD: LIPASE: 20 U/L (ref 11–51)

## 2017-04-30 LAB — CBC WITH DIFFERENTIAL/PLATELET
BASOS ABS: 0.1 10*3/uL (ref 0–0.1)
Basophils Relative: 1 %
Eosinophils Absolute: 0 10*3/uL (ref 0–0.7)
Eosinophils Relative: 0 %
HEMATOCRIT: 41.2 % (ref 35.0–47.0)
HEMOGLOBIN: 13.6 g/dL (ref 12.0–16.0)
LYMPHS PCT: 11 %
Lymphs Abs: 1 10*3/uL (ref 1.0–3.6)
MCH: 28.5 pg (ref 26.0–34.0)
MCHC: 33.1 g/dL (ref 32.0–36.0)
MCV: 86.2 fL (ref 80.0–100.0)
Monocytes Absolute: 0.2 10*3/uL (ref 0.2–0.9)
Monocytes Relative: 3 %
NEUTROS ABS: 8 10*3/uL — AB (ref 1.4–6.5)
Neutrophils Relative %: 85 %
PLATELETS: 207 10*3/uL (ref 150–440)
RBC: 4.77 MIL/uL (ref 3.80–5.20)
RDW: 13.7 % (ref 11.5–14.5)
WBC: 9.3 10*3/uL (ref 3.6–11.0)

## 2017-04-30 LAB — COMPREHENSIVE METABOLIC PANEL
ALT: 11 U/L — AB (ref 14–54)
AST: 16 U/L (ref 15–41)
Albumin: 4.7 g/dL (ref 3.5–5.0)
Alkaline Phosphatase: 39 U/L (ref 38–126)
Anion gap: 18 — ABNORMAL HIGH (ref 5–15)
BILIRUBIN TOTAL: 1.4 mg/dL — AB (ref 0.3–1.2)
BUN: 12 mg/dL (ref 6–20)
CHLORIDE: 96 mmol/L — AB (ref 101–111)
CO2: 22 mmol/L (ref 22–32)
CREATININE: 0.63 mg/dL (ref 0.44–1.00)
Calcium: 10.1 mg/dL (ref 8.9–10.3)
GFR calc Af Amer: >60 mL/min (ref >60)
Glucose, Bld: 164 mg/dL — ABNORMAL HIGH (ref 65–99)
Potassium: 3.1 mmol/L — ABNORMAL LOW (ref 3.5–5.1)
Sodium: 136 mmol/L (ref 135–145)
Total Protein: 8.4 g/dL — ABNORMAL HIGH (ref 6.5–8.1)

## 2017-04-30 LAB — HCG, QUANTITATIVE, PREGNANCY: hCG, Beta Chain, Quant, S: 1 m[IU]/mL (ref <5)

## 2017-04-30 LAB — CHLAMYDIA/NGC RT PCR (ARMC ONLY)
Chlamydia Tr: NOT DETECTED
N gonorrhoeae: NOT DETECTED

## 2017-04-30 LAB — WET PREP, GENITAL
CLUE CELLS WET PREP: NONE SEEN
SPERM: NONE SEEN
TRICH WET PREP: NONE SEEN
Yeast Wet Prep HPF POC: NONE SEEN

## 2017-04-30 MED ORDER — ONDANSETRON HCL 4 MG/2ML IJ SOLN
4.0000 mg | Freq: Once | INTRAMUSCULAR | Status: AC
  Administered 2017-04-30: 4 mg via INTRAVENOUS
  Filled 2017-04-30: qty 2

## 2017-04-30 MED ORDER — LORAZEPAM 2 MG/ML IJ SOLN
1.0000 mg | Freq: Once | INTRAMUSCULAR | Status: AC
  Administered 2017-04-30: 1 mg via INTRAVENOUS
  Filled 2017-04-30: qty 1

## 2017-04-30 MED ORDER — SODIUM CHLORIDE 0.9 % IV BOLUS (SEPSIS)
1000.0000 mL | Freq: Once | INTRAVENOUS | Status: AC
  Administered 2017-04-30: 1000 mL via INTRAVENOUS

## 2017-04-30 MED ORDER — HALOPERIDOL LACTATE 5 MG/ML IJ SOLN
5.0000 mg | Freq: Once | INTRAMUSCULAR | Status: AC
  Administered 2017-04-30: 5 mg via INTRAVENOUS
  Filled 2017-04-30: qty 1

## 2017-04-30 NOTE — ED Triage Notes (Signed)
Pt ems from home for nausea, vomiting and abd pain. Pt with rocking motion while here.

## 2017-04-30 NOTE — ED Provider Notes (Signed)
[facility] Emergency Department Provider Note  ____________________________________________   First MD Initiated Contact with Patient 04/30/17 [phone]     (approximate)  I have reviewed the triage vital signs and the nursing notes.   HISTORY  Chief Complaint Nausea and Abdominal Pain   HPI Rachael Gray is a 37 y.o. female with a history of diabetes, gastroparesis, lupus as well as seizures and pseudoseizures who is presenting to the emergency department today for diffuse abdominal pain as well as vaginal bleeding.  Says that she was having clots from the vagina this morning but this has slowed since.  She is also reporting multiple episodes of vomiting which progressed to blood streaks in the vomitus.  She is describing diffuse and severe abdominal pain which is nonradiating.  Was just seen at the emergency department this past Monday for similar issue but without the vaginal bleeding.  She was concerned at that time that she was pregnant but her hCG was negative.  Also complaining of seizure activity but able, per EMS, to walk and talk through these episodes.  Patient states that she usually has normal, regular periods.  Past Medical History:  Diagnosis Date  . Collagen vascular disease ([place])   . Diabetes mellitus without complication ([place])   . Gastroparesis   . Lupus   . Pseudoseizures   . Seizures [facility])     Patient Active Problem List   Diagnosis Date Noted  . Hypokalemia 12/24/2015  . Anemia 12/24/2015  . Diabetes ([place]) 12/24/2015  . Gastroparesis due to DM ([place])   . Acute respiratory alkalosis 12/22/2015  . Duodenitis 12/22/2015  . Vomiting 12/16/2015  . Intractable nausea and vomiting 12/10/2014  . Nausea & vomiting 12/10/2014  . Narcotic abuse ([place]) 12/09/2014  . Sepsis ([place]) 12/07/2014  . Anxiety   . Pyelonephritis 12/04/2014  . Lactic acidosis 12/04/2014    Class: Acute    Past Surgical History:  Procedure Laterality Date  .  APPENDECTOMY    . CHOLECYSTECTOMY      Prior to Admission medications   Medication Sig Start Date End Date Taking? Authorizing Provider  insulin aspart (NOVOLOG) 100 UNIT/ML injection Inject 0-5 Units into the skin at bedtime. 03/07/17   Epifanio Lesches, MD  insulin NPH-regular Human (NOVOLIN 70/30) (70-30) 100 UNIT/ML injection Inject 22-25 Units into the skin 2 (two) times daily with a meal. 25 units in the morning and 22 units in the evening 03/07/17   Epifanio Lesches, MD  levETIRAcetam (KEPPRA) 750 MG tablet Take 1 tablet (750 mg total) by mouth 2 (two) times daily. Patient not taking: Reported on 06/09/2016 08/21/15   Paulette Blanch, MD  metFORMIN (GLUCOPHAGE) 500 MG tablet Take 1,000 mg by mouth 2 (two) times daily with a meal.  11/25/16   [provider]  metoCLOPramide (REGLAN) 10 MG tablet Take 1 tablet (10 mg total) by mouth every 8 (eight) hours as needed. Patient not taking: Reported on 03/06/2017 02/25/17   Loney Hering, MD  ondansetron (ZOFRAN ODT) 4 MG disintegrating tablet Take 1 tablet (4 mg total) by mouth every 8 (eight) hours as needed for nausea or vomiting. 04/28/17   Rudene Re, MD  potassium chloride (K-DUR) 10 MEQ tablet Take 1 tablet (10 mEq total) by mouth daily. 03/07/17   Epifanio Lesches, MD    Allergies Dilantin [phenytoin sodium extended]; Doxycycline; Other; Morphine and related; and Tylenol [acetaminophen]  Family History  Problem Relation Age of Onset  . Breast cancer Maternal Grandmother  Social History Social History   Tobacco Use  . Smoking status: Current Every Day Smoker    Packs/day: 0.50    Years: 10.00    Pack years: 5.00    Types: Cigarettes  . Smokeless tobacco: Never Used  Substance Use Topics  . Alcohol use: No  . Drug use: Yes    Types: Marijuana    Comment: 3 months ago.     Review of Systems  Constitutional: No fever/chills Eyes: No visual changes. ENT: No sore throat. Cardiovascular:  Denies chest pain. Respiratory: Denies shortness of breath. Gastrointestinal:  No diarrhea.  No constipation. Genitourinary: Negative for dysuria. Musculoskeletal: Negative for back pain. Skin: Negative for rash. Neurological: Negative for headaches, focal weakness or numbness.   ____________________________________________   PHYSICAL EXAM:  VITAL SIGNS: ED Triage Vitals  Enc Vitals Group     BP      Pulse      Resp      Temp      Temp src      SpO2      Weight      Height      Head Circumference      Peak Flow      Pain Score      Pain Loc      Pain Edu?      Excl. in [place]?     Constitutional: Alert and oriented.  Patient with rhythmic, circular motions of the bilateral upper extremities but is fully conscious.  Patient actively vomiting and able to hold the vomitus bag steadily.  In between episodes of vomiting the patient returns to her circular, jerking movements. Eyes: Conjunctivae are normal.  Head: Atraumatic. Nose: No congestion/rhinnorhea. Mouth/Throat: Mucous membranes are moist.  Neck: No stridor.   Cardiovascular: Normal rate, regular rhythm. Grossly normal heart sounds.   Respiratory: Normal respiratory effort.  No retractions. Lungs CTAB. Gastrointestinal: Soft abdomen but diffusely tender.  No rebound or guarding. No distention. No CVA tenderness. Genitourinary: Normal external exam without any lesions.  Speculum exam with small amount of blood pooling in the vagina with a small amount of blood visualized coming from the cervix.  Bimanual exam without CMT.  No uterine tenderness no adnexal tenderness nor masses.  No intravaginal lesions or lacerations visualized. Musculoskeletal: No lower extremity tenderness nor edema.  No joint effusions. Neurologic:  Normal speech and language. No gross focal neurologic deficits are appreciated. Skin:  Skin is warm, dry and intact. No rash noted. Psychiatric: Mood and affect are normal. Speech and behavior are  normal.  ____________________________________________   LABS (all labs ordered are listed, but only abnormal results are displayed)  Labs Reviewed  CHLAMYDIA/NGC RT PCR ([facility] ONLY)  WET PREP, GENITAL  CBC WITH DIFFERENTIAL/PLATELET  COMPREHENSIVE METABOLIC PANEL  LIPASE, BLOOD  URINALYSIS, COMPLETE (UACMP) WITH MICROSCOPIC  HCG, QUANTITATIVE, PREGNANCY   ____________________________________________  EKG   ____________________________________________  RADIOLOGY  Ultrasound of the pelvis without acute pathology. ____________________________________________   PROCEDURES  Procedure(s) performed:   Procedures  Critical Care performed:   ____________________________________________   INITIAL IMPRESSION / ASSESSMENT AND PLAN / ED COURSE  Pertinent labs & imaging results that were available during my care of the patient were reviewed by me and considered in my medical decision making (see chart for details).  Differential diagnosis includes, but is not limited to, ovarian cyst, ovarian torsion, acute appendicitis, diverticulitis, urinary tract infection/pyelonephritis, endometriosis, bowel obstruction, colitis, renal colic, gastroenteritis, hernia, fibroids, endometriosis, pregnancy related pain including ectopic pregnancy, etc.  Differential diagnosis includes, but is not limited to, biliary disease (biliary colic, acute cholecystitis, cholangitis, choledocholithiasis, etc), intrathoracic causes for epigastric abdominal pain including ACS, gastritis, duodenitis, pancreatitis, small bowel or large bowel obstruction, abdominal aortic aneurysm, hernia, and gastritis. As part of my medical decision making, I reviewed the following data within the electronic MEDICAL RECORD NUMBER Notes from prior ED visits  Patient was here several days ago and symptoms were resolved with Haldol.     ----------------------------------------- 1:12 PM on  04/30/2017 -----------------------------------------  After Haldol the patient is resting comfortably.  Asleep but easily awoken.  Able to tolerate cranberry juice by mouth.  Abdomen palpated and is only mildly tender to palpation throughout.  Likely ongoing chronic issues with gastroparesis.  We will give follow-up with OB/GYN as well for dysfunctional uterine bleeding.  Patient understanding of the plan willing to comply.  ____________________________________________   FINAL CLINICAL IMPRESSION(S) / ED DIAGNOSES  Generalized abdominal pain with nausea and vomiting.  Gastroparesis.  Dysfunctional uterine bleeding.    NEW MEDICATIONS STARTED DURING THIS VISIT:  New Prescriptions   No medications on file     Note:  This document was prepared using Dragon voice recognition software and may include unintentional dictation errors.     Orbie Pyo, MD 04/30/17 [phone]

## 2017-06-11 ENCOUNTER — Emergency Department
Admission: EM | Admit: 2017-06-11 | Discharge: 2017-06-11 | Disposition: A | Discharge status: Home / Self Care | Payer: Medicaid Other | Attending: Emergency Medicine | Admitting: Emergency Medicine

## 2017-06-11 ENCOUNTER — Encounter: Payer: Self-pay | Admitting: Emergency Medicine

## 2017-06-11 DIAGNOSIS — Z5321 Procedure and treatment not carried out due to patient leaving prior to being seen by health care provider: Secondary | ICD-10-CM | POA: Diagnosis not present

## 2017-06-11 DIAGNOSIS — R109 Unspecified abdominal pain: Secondary | ICD-10-CM | POA: Diagnosis present

## 2017-06-11 LAB — URINALYSIS, COMPLETE (UACMP) WITH MICROSCOPIC
Bacteria, UA: NONE SEEN
Bilirubin Urine: NEGATIVE
Glucose, UA: 50 mg/dL — AB
Ketones, ur: 5 mg/dL — AB
LEUKOCYTES UA: NEGATIVE
Nitrite: NEGATIVE
Protein, ur: 100 mg/dL — AB
SPECIFIC GRAVITY, URINE: 1.02 (ref 1.005–1.030)
pH: 5 (ref 5.0–8.0)

## 2017-06-11 LAB — COMPREHENSIVE METABOLIC PANEL
ALK PHOS: 47 U/L (ref 38–126)
ALT: 10 U/L — AB (ref 14–54)
AST: 18 U/L (ref 15–41)
Albumin: 4.6 g/dL (ref 3.5–5.0)
Anion gap: 14 (ref 5–15)
BILIRUBIN TOTAL: 0.6 mg/dL (ref 0.3–1.2)
BUN: 6 mg/dL (ref 6–20)
CO2: 24 mmol/L (ref 22–32)
CREATININE: 0.44 mg/dL (ref 0.44–1.00)
Calcium: 10 mg/dL (ref 8.9–10.3)
Chloride: 101 mmol/L (ref 101–111)
GFR calc Af Amer: >60 mL/min (ref >60)
Glucose, Bld: 143 mg/dL — ABNORMAL HIGH (ref 65–99)
Potassium: 2.8 mmol/L — ABNORMAL LOW (ref 3.5–5.1)
Sodium: 139 mmol/L (ref 135–145)
TOTAL PROTEIN: 8.8 g/dL — AB (ref 6.5–8.1)

## 2017-06-11 LAB — CBC
HCT: 44.5 % (ref 35.0–47.0)
Hemoglobin: 14.1 g/dL (ref 12.0–16.0)
MCH: 27.8 pg (ref 26.0–34.0)
MCHC: 31.6 g/dL — ABNORMAL LOW (ref 32.0–36.0)
MCV: 87.9 fL (ref 80.0–100.0)
PLATELETS: 360 10*3/uL (ref 150–440)
RBC: 5.06 MIL/uL (ref 3.80–5.20)
RDW: 14.7 % — AB (ref 11.5–14.5)
WBC: 9.6 10*3/uL (ref 3.6–11.0)

## 2017-06-11 LAB — LIPASE, BLOOD: Lipase: 29 U/L (ref 11–51)

## 2017-06-11 LAB — POCT PREGNANCY, URINE: PREG TEST UR: NEGATIVE

## 2017-06-11 NOTE — ED Notes (Signed)
First Nurse Note:  Patient not in [place] or rest room.  Looked outside, [facility], or Triage area.  Patient did not say that she was leaving waiting area.

## 2017-06-11 NOTE — ED Notes (Signed)
First Nurse Note:  Patient not in [place].

## 2017-06-11 NOTE — ED Triage Notes (Signed)
Pt c/o abd pain started this am, denies any n/v/d.

## 2017-06-11 NOTE — ED Notes (Signed)
First Nurse Note:  Unable to locate patient in [place], restroom, or outside.

## 2017-06-13 ENCOUNTER — Emergency Department
Admission: EM | Admit: 2017-06-13 | Discharge: 2017-06-13 | Disposition: A | Discharge status: Home / Self Care | Payer: Medicaid Other | Attending: Emergency Medicine | Admitting: Emergency Medicine

## 2017-06-13 ENCOUNTER — Encounter: Payer: Self-pay | Admitting: Emergency Medicine

## 2017-06-13 ENCOUNTER — Other Ambulatory Visit: Payer: Self-pay

## 2017-06-13 DIAGNOSIS — E1165 Type 2 diabetes mellitus with hyperglycemia: Secondary | ICD-10-CM | POA: Diagnosis not present

## 2017-06-13 DIAGNOSIS — R55 Syncope and collapse: Secondary | ICD-10-CM | POA: Diagnosis present

## 2017-06-13 DIAGNOSIS — F17210 Nicotine dependence, cigarettes, uncomplicated: Secondary | ICD-10-CM | POA: Insufficient documentation

## 2017-06-13 DIAGNOSIS — F419 Anxiety disorder, unspecified: Secondary | ICD-10-CM | POA: Diagnosis not present

## 2017-06-13 DIAGNOSIS — Z9049 Acquired absence of other specified parts of digestive tract: Secondary | ICD-10-CM | POA: Insufficient documentation

## 2017-06-13 DIAGNOSIS — E162 Hypoglycemia, unspecified: Secondary | ICD-10-CM

## 2017-06-13 DIAGNOSIS — N3001 Acute cystitis with hematuria: Secondary | ICD-10-CM | POA: Insufficient documentation

## 2017-06-13 DIAGNOSIS — Z794 Long term (current) use of insulin: Secondary | ICD-10-CM | POA: Insufficient documentation

## 2017-06-13 LAB — URINALYSIS, COMPLETE (UACMP) WITH MICROSCOPIC
BILIRUBIN URINE: NEGATIVE
Glucose, UA: NEGATIVE mg/dL
Ketones, ur: 5 mg/dL — AB
LEUKOCYTES UA: NEGATIVE
NITRITE: NEGATIVE
PH: 6 (ref 5.0–8.0)
Protein, ur: 30 mg/dL — AB
SPECIFIC GRAVITY, URINE: 1.014 (ref 1.005–1.030)

## 2017-06-13 LAB — GLUCOSE, CAPILLARY
GLUCOSE-CAPILLARY: 119 mg/dL — AB (ref 65–99)
GLUCOSE-CAPILLARY: 75 mg/dL (ref 65–99)
Glucose-Capillary: 136 mg/dL — ABNORMAL HIGH (ref 65–99)
Glucose-Capillary: 147 mg/dL — ABNORMAL HIGH (ref 65–99)
Glucose-Capillary: 75 mg/dL (ref 65–99)
Glucose-Capillary: 123 mg/dL — ABNORMAL HIGH (ref 65–99)

## 2017-06-13 LAB — MAGNESIUM: Magnesium: 2 mg/dL (ref 1.7–2.4)

## 2017-06-13 LAB — CBC WITH DIFFERENTIAL/PLATELET
BASOS ABS: 0.1 10*3/uL (ref 0–0.1)
BASOS PCT: 1 %
Eosinophils Absolute: 0 10*3/uL (ref 0–0.7)
Eosinophils Relative: 0 %
HCT: 40.9 % (ref 35.0–47.0)
Hemoglobin: 13.5 g/dL (ref 12.0–16.0)
Lymphocytes Relative: 13 %
Lymphs Abs: 1.4 10*3/uL (ref 1.0–3.6)
MCH: 28.4 pg (ref 26.0–34.0)
MCHC: 33 g/dL (ref 32.0–36.0)
MCV: 86.1 fL (ref 80.0–100.0)
MONO ABS: 1.1 10*3/uL — AB (ref 0.2–0.9)
Monocytes Relative: 10 %
NEUTROS ABS: 8.2 10*3/uL — AB (ref 1.4–6.5)
Neutrophils Relative %: 76 %
Platelets: 336 10*3/uL (ref 150–440)
RBC: 4.75 MIL/uL (ref 3.80–5.20)
RDW: 14.8 % — AB (ref 11.5–14.5)
WBC: 10.8 10*3/uL (ref 3.6–11.0)

## 2017-06-13 LAB — BASIC METABOLIC PANEL
ANION GAP: 14 (ref 5–15)
BUN: 10 mg/dL (ref 6–20)
CALCIUM: 10.1 mg/dL (ref 8.9–10.3)
CO2: 31 mmol/L (ref 22–32)
Chloride: 94 mmol/L — ABNORMAL LOW (ref 101–111)
Creatinine, Ser: 0.68 mg/dL (ref 0.44–1.00)
GFR calc non Af Amer: >60 mL/min (ref >60)
Glucose, Bld: 62 mg/dL — ABNORMAL LOW (ref 65–99)
Potassium: 2.4 mmol/L — CL (ref 3.5–5.1)
SODIUM: 139 mmol/L (ref 135–145)

## 2017-06-13 LAB — HCG, QUANTITATIVE, PREGNANCY: hCG, Beta Chain, Quant, S: 1 m[IU]/mL (ref <5)

## 2017-06-13 MED ORDER — NITROFURANTOIN MONOHYD MACRO 100 MG PO CAPS
100.0000 mg | ORAL_CAPSULE | Freq: Once | ORAL | Status: AC
  Administered 2017-06-13: 100 mg via ORAL
  Filled 2017-06-13: qty 1

## 2017-06-13 MED ORDER — POTASSIUM CHLORIDE CRYS ER 20 MEQ PO TBCR
40.0000 meq | EXTENDED_RELEASE_TABLET | Freq: Once | ORAL | Status: AC
  Administered 2017-06-13: 40 meq via ORAL
  Filled 2017-06-13: qty 2

## 2017-06-13 MED ORDER — NITROFURANTOIN MONOHYD MACRO 100 MG PO CAPS
ORAL_CAPSULE | ORAL | Status: AC
  Filled 2017-06-13: qty 1

## 2017-06-13 MED ORDER — GLUCOSE 40 % PO GEL
1.0000 | Freq: Once | ORAL | Status: AC
  Administered 2017-06-13: 37.5 g via ORAL

## 2017-06-13 MED ORDER — CEPHALEXIN 500 MG PO CAPS: 500.0000 mg | ORAL_CAPSULE | Freq: Once | ORAL | Status: DC

## 2017-06-13 MED ORDER — OXYCODONE HCL 5 MG PO TABS
5.0000 mg | ORAL_TABLET | Freq: Once | ORAL | Status: AC
  Administered 2017-06-13: 5 mg via ORAL
  Filled 2017-06-13: qty 1

## 2017-06-13 MED ORDER — CEPHALEXIN 500 MG PO CAPS: 500.0000 mg | ORAL_CAPSULE | Freq: Three times a day (TID) | ORAL | 0 refills | Status: DC

## 2017-06-13 MED ORDER — NITROFURANTOIN MONOHYD MACRO 100 MG PO CAPS
100.0000 mg | ORAL_CAPSULE | Freq: Two times a day (BID) | ORAL | 0 refills | Status: AC
Start: 2017-06-13 — End: 2017-06-18

## 2017-06-13 MED ORDER — POTASSIUM CHLORIDE 10 MEQ/100ML IV SOLN
10.0000 meq | Freq: Once | INTRAVENOUS | Status: AC
  Administered 2017-06-13: 10 meq via INTRAVENOUS
  Filled 2017-06-13: qty 100

## 2017-06-13 MED ORDER — ONDANSETRON HCL 4 MG/2ML IJ SOLN
4.0000 mg | Freq: Once | INTRAMUSCULAR | Status: AC
  Administered 2017-06-13: 4 mg via INTRAVENOUS
  Filled 2017-06-13: qty 2

## 2017-06-13 MED ORDER — DEXTROSE-NACL 5-0.9 % IV SOLN
INTRAVENOUS | Status: DC
  Administered 2017-06-13: 16:00:00 via INTRAVENOUS

## 2017-06-13 MED ORDER — SODIUM CHLORIDE 0.9 % IV BOLUS (SEPSIS)
500.0000 mL | Freq: Once | INTRAVENOUS | Status: AC
  Administered 2017-06-13: 500 mL via INTRAVENOUS

## 2017-06-13 NOTE — ED Notes (Signed)
Pt assisted up to bathroom.  

## 2017-06-13 NOTE — ED Notes (Signed)
Report from sarah, rn.  

## 2017-06-13 NOTE — ED Notes (Signed)
Pt provided with phone to call mother for ride upon discharge.

## 2017-06-13 NOTE — Discharge Instructions (Addendum)
Hold your insulin tonight. Re-start again tomorrow per your sliding scale. Take antibiotics as prescribed.

## 2017-06-13 NOTE — ED Notes (Signed)
Per MD, give pt crackers and orange juice and recheck blood sugar after potassium infusion completed.

## 2017-06-13 NOTE — ED Provider Notes (Signed)
[facility] Emergency Department Provider Note  ____________________________________________  Time seen: Approximately 3:27 PM  I have reviewed the triage vital signs and the nursing notes.   HISTORY  Chief Complaint Hypoglycemia  Level 5 caveat:  Portions of the history and physical were unable to be obtained due to ams/ unresponsivenes   HPI Rachael Gray is a 37 y.o. female with a history of DM 2, gastroparesis, pseudoseizures, anemia who presents for evaluation of hypoglycemia.  According to EMS they were called for unresponsiveness.  When they arrived to the patient's house fire department was bagging patient.  She was unresponsive and hypoxic.  She did have a pulse.  Unclear if she had a seizure.  Her initial glucose was 34.  She received IM glucagon with improvement of her mental status.  She then received oral glucose and round.  Patient reports that she checked her BG which was 202, she then ate lunch and took her dose of insulin. Right after she vomited her lunch. She does not remember how much insulin she administered. She reports that she felt nauseous and vomited one time. No longer nauseous.  She denies diarrhea, fever, abdominal pain, dysuria, hematuria, chest pain, shortness of breath, URI symptoms. She does not remember anything after vomiting.   Past Medical History:  Diagnosis Date  . Collagen vascular disease ([place])   . Diabetes mellitus without complication ([place])   . Gastroparesis   . Lupus   . Pseudoseizures   . Seizures [facility])     Patient Active Problem List   Diagnosis Date Noted  . Hypokalemia 12/24/2015  . Anemia 12/24/2015  . Diabetes ([place]) 12/24/2015  . Gastroparesis due to DM ([place])   . Acute respiratory alkalosis 12/22/2015  . Duodenitis 12/22/2015  . Vomiting 12/16/2015  . Intractable nausea and vomiting 12/10/2014  . Nausea & vomiting 12/10/2014  . Narcotic abuse ([place]) 12/09/2014  . Sepsis ([place]) 12/07/2014  .  Anxiety   . Pyelonephritis 12/04/2014  . Lactic acidosis 12/04/2014    Class: Acute    Past Surgical History:  Procedure Laterality Date  . APPENDECTOMY    . CHOLECYSTECTOMY      Prior to Admission medications   Medication Sig Start Date End Date Taking? Authorizing Provider  insulin aspart (NOVOLOG) 100 UNIT/ML injection Inject 0-5 Units into the skin at bedtime. Patient taking differently: Inject 3 Units into the skin at bedtime.  03/07/17  Yes Epifanio Lesches, MD  insulin NPH-regular Human (NOVOLIN 70/30) (70-30) 100 UNIT/ML injection Inject 22-25 Units into the skin 2 (two) times daily with a meal. 25 units in the morning and 22 units in the evening Patient not taking: Reported on 04/30/2017 03/07/17   Epifanio Lesches, MD  levETIRAcetam (KEPPRA) 750 MG tablet Take 1 tablet (750 mg total) by mouth 2 (two) times daily. Patient not taking: Reported on 06/09/2016 08/21/15   Paulette Blanch, MD  metoCLOPramide (REGLAN) 10 MG tablet Take 1 tablet (10 mg total) by mouth every 8 (eight) hours as needed. Patient not taking: Reported on 03/06/2017 02/25/17   Loney Hering, MD  ondansetron (ZOFRAN ODT) 4 MG disintegrating tablet Take 1 tablet (4 mg total) by mouth every 8 (eight) hours as needed for nausea or vomiting. Patient not taking: Reported on 06/13/2017 04/28/17   Rudene Re, MD  potassium chloride (K-DUR) 10 MEQ tablet Take 1 tablet (10 mEq total) by mouth daily. Patient not taking: Reported on 04/30/2017 03/07/17   Epifanio Lesches, MD  Allergies Dilantin [phenytoin sodium extended]; Doxycycline; Other; Morphine and related; and Tylenol [acetaminophen]  Family History  Problem Relation Age of Onset  . Breast cancer Maternal Grandmother     Social History Social History   Tobacco Use  . Smoking status: Current Every Day Smoker    Packs/day: 0.50    Years: 10.00    Pack years: 5.00    Types: Cigarettes  . Smokeless tobacco: Never Used  Substance Use  Topics  . Alcohol use: No  . Drug use: Yes    Types: Marijuana    Comment: 3 months ago.     Review of Systems  Constitutional: Negative for fever. + unresponsive and hypoglycemia Eyes: Negative for visual changes. ENT: Negative for sore throat. Neck: No neck pain  Cardiovascular: Negative for chest pain. Respiratory: Negative for shortness of breath. Gastrointestinal: Negative for abdominal pain, diarrhea. + N/V Genitourinary: Negative for dysuria. Musculoskeletal: Negative for back pain. Skin: Negative for rash. Neurological: Negative for headaches, weakness or numbness. Psych: No SI or HI  ____________________________________________   PHYSICAL EXAM:  VITAL SIGNS: Vitals:   06/13/17 2000 06/13/17 2030  BP:    Pulse: 86 94  Resp: 14 13  Temp:    SpO2: 99% 99%   Constitutional: Alert and oriented x3. Well appearing and in no apparent distress. HEENT:      Head: Normocephalic and atraumatic.         Eyes: Conjunctivae are normal. Sclera is non-icteric.       Mouth/Throat: Mucous membranes are moist.       Neck: Supple with no signs of meningismus. Cardiovascular: Regular rate and rhythm. No murmurs, gallops, or rubs. 2+ symmetrical distal pulses are present in all extremities. No JVD. Respiratory: Normal respiratory effort. Lungs are clear to auscultation bilaterally. No wheezes, crackles, or rhonchi.  Gastrointestinal: Soft, non tender, and non distended with positive bowel sounds. No rebound or guarding. Musculoskeletal: Nontender with normal range of motion in all extremities. No edema, cyanosis, or erythema of extremities. Neurologic: Normal speech and language. Face is symmetric. Moving all extremities. No gross focal neurologic deficits are appreciated. Skin: Skin is warm, dry and intact. No rash noted. Psychiatric: Mood and affect are normal. Speech and behavior are normal.  ____________________________________________   LABS (all labs ordered are listed,  but only abnormal results are displayed)  Labs Reviewed  CBC WITH DIFFERENTIAL/PLATELET - Abnormal; Notable for the following components:      Result Value   RDW 14.8 (*)    Neutro Abs 8.2 (*)    Monocytes Absolute 1.1 (*)    All other components within normal limits  BASIC METABOLIC PANEL - Abnormal; Notable for the following components:   Potassium 2.4 (*)    Chloride 94 (*)    Glucose, Bld 62 (*)    All other components within normal limits  URINALYSIS, COMPLETE (UACMP) WITH MICROSCOPIC - Abnormal; Notable for the following components:   Color, Urine YELLOW (*)    APPearance CLOUDY (*)    Hgb urine dipstick LARGE (*)    Ketones, ur 5 (*)    Protein, ur 30 (*)    Bacteria, UA RARE (*)    Squamous Epithelial / LPF 0-5 (*)    All other components within normal limits  GLUCOSE, CAPILLARY - Abnormal; Notable for the following components:   Glucose-Capillary 119 (*)    All other components within normal limits  GLUCOSE, CAPILLARY - Abnormal; Notable for the following components:   Glucose-Capillary 147 (*)  All other components within normal limits  GLUCOSE, CAPILLARY - Abnormal; Notable for the following components:   Glucose-Capillary 136 (*)    All other components within normal limits  GLUCOSE, CAPILLARY - Abnormal; Notable for the following components:   Glucose-Capillary 123 (*)    All other components within normal limits  URINE CULTURE  HCG, QUANTITATIVE, PREGNANCY  MAGNESIUM  GLUCOSE, CAPILLARY  GLUCOSE, CAPILLARY  CBG MONITORING, ED  CBG MONITORING, ED  CBG MONITORING, ED   ____________________________________________  EKG  ED ECG REPORT I, Rudene Re, the attending physician, personally viewed and interpreted this ECG.  Normal sinus rhythm, rate of 99, normal intervals, normal axis, no ST elevations or depressions.  Unchanged from prior. ____________________________________________  RADIOLOGY  none   ____________________________________________   PROCEDURES  Procedure(s) performed: None Procedures Critical Care performed:  None ____________________________________________   INITIAL IMPRESSION / ASSESSMENT AND PLAN / ED COURSE  37 y.o. female with a history of DM 2, gastroparesis, pseudoseizures, anemia who presents for evaluation of hypoglycemia and unresponsiveness after taking insulin and then immediately vomiting her lunch. Patient's BG upon arrival was 46. Patient given PO glucose while IV access is being obtained. A&O x3, abdomen soft and non tender, no trauma. Plan for D5NS bolus, zofran, labs, hcg, UA and monitor for stabilization of BG.    _________________________ 8:36 PM on 06/13/2017 -----------------------------------------  Patient has now had normal glucose read for 4 hours, tolerating PO. Labs showing UTI for which she was started on macrobid. No signs of sepsis. K low which was supplemented PO and IV. Patient is now stable for DC.  Recommended that she hold her insulin this evening and restart tomorrow morning based on her sliding scale.  She is being discharged home with a prescription for Macrobid.  Discussed return precautions for any signs of pyelonephritis.   As part of my medical decision making, I reviewed the following data within the [place] notes reviewed and incorporated, Labs reviewed , EKG interpreted , Old EKG reviewed, Notes from prior ED visits and [place] Controlled Substance Database    Pertinent labs & imaging results that were available during my care of the patient were reviewed by me and considered in my medical decision making (see chart for details).    ____________________________________________   FINAL CLINICAL IMPRESSION(S) / ED DIAGNOSES  Final diagnoses:  Hypoglycemia  Acute cystitis with hematuria      NEW MEDICATIONS STARTED DURING THIS VISIT:  ED Discharge Orders        Ordered    cephALEXin  (KEFLEX) 500 MG capsule  3 times daily,   Status:  Discontinued     06/13/17 2030       Note:  This document was prepared using Dragon voice recognition software and may include unintentional dictation errors.    Alfred Levins, [place], MD 06/13/17 2038

## 2017-06-13 NOTE — ED Notes (Signed)
Pt states she has leg pain, md notified, pt with fsbs of 75 currently. md notified, orders for pt to eat a meal tray. Pt states she is not going to eat unless she recieves something for pain. md notified. Pt states "it's cruel to force me to eat when i'm in pain".

## 2017-06-13 NOTE — ED Notes (Signed)
Date and time results received: 06/13/17  1616  Test: Potassium Critical Value: 2.4  Name of Provider Notified: Dr. Alfred Levins  Orders Received? Or Actions Taken?: No new orders. Will continue to monitor

## 2017-06-13 NOTE — ED Triage Notes (Signed)
Pt came in via EMS from home c/o low blood sugar.  Per fire department they had to bag patient for a few minutes upon arrival.  Initial blood sugar was 34, EMS gave glucagon en route to hospital and blood sugar came up to 37, then oral glucose and blood sugar came up to 66.  Per EMS, family states that pt has h/o seizures, not on medication (per pt).  Per Ems, family also states that she ate lunch took her insulin then vomited the lunch back up.

## 2017-06-13 NOTE — ED Notes (Signed)
Pt eating meal tray. Confirmed with md, pt will only need one minibag of 28meq kcl. Pharmacy Corene Cornea, pharmacist notified.

## 2017-06-15 LAB — URINE CULTURE

## 2017-06-16 LAB — GLUCOSE, CAPILLARY: GLUCOSE-CAPILLARY: 46 mg/dL — AB (ref 65–99)

## 2017-07-07 ENCOUNTER — Emergency Department
Admission: EM | Admit: 2017-07-07 | Discharge: 2017-07-07 | Disposition: A | Discharge status: Home / Self Care | Payer: Medicaid Other | Attending: Emergency Medicine | Admitting: Emergency Medicine

## 2017-07-07 ENCOUNTER — Other Ambulatory Visit: Payer: Self-pay

## 2017-07-07 ENCOUNTER — Encounter: Payer: Self-pay | Admitting: *Deleted

## 2017-07-07 DIAGNOSIS — R109 Unspecified abdominal pain: Secondary | ICD-10-CM | POA: Insufficient documentation

## 2017-07-07 DIAGNOSIS — Z5321 Procedure and treatment not carried out due to patient leaving prior to being seen by health care provider: Secondary | ICD-10-CM | POA: Insufficient documentation

## 2017-07-07 LAB — COMPREHENSIVE METABOLIC PANEL
ALBUMIN: 4.5 g/dL (ref 3.5–5.0)
ALT: 10 U/L — ABNORMAL LOW (ref 14–54)
ANION GAP: 18 — AB (ref 5–15)
AST: 20 U/L (ref 15–41)
Alkaline Phosphatase: 42 U/L (ref 38–126)
BILIRUBIN TOTAL: 0.9 mg/dL (ref 0.3–1.2)
BUN: 23 mg/dL — ABNORMAL HIGH (ref 6–20)
CO2: 28 mmol/L (ref 22–32)
Calcium: 10.7 mg/dL — ABNORMAL HIGH (ref 8.9–10.3)
Chloride: 89 mmol/L — ABNORMAL LOW (ref 101–111)
Creatinine, Ser: 0.88 mg/dL (ref 0.44–1.00)
GFR calc Af Amer: >60 mL/min (ref >60)
GFR calc non Af Amer: >60 mL/min (ref >60)
GLUCOSE: 116 mg/dL — AB (ref 65–99)
Potassium: 2.5 mmol/L — CL (ref 3.5–5.1)
SODIUM: 135 mmol/L (ref 135–145)
TOTAL PROTEIN: 8.2 g/dL — AB (ref 6.5–8.1)

## 2017-07-07 LAB — URINALYSIS, COMPLETE (UACMP) WITH MICROSCOPIC
Bilirubin Urine: NEGATIVE
GLUCOSE, UA: NEGATIVE mg/dL
Ketones, ur: 80 mg/dL — AB
LEUKOCYTES UA: NEGATIVE
Nitrite: NEGATIVE
PH: 5 (ref 5.0–8.0)
Protein, ur: NEGATIVE mg/dL
SPECIFIC GRAVITY, URINE: 1.015 (ref 1.005–1.030)

## 2017-07-07 LAB — CBC
HCT: 43.8 % (ref 35.0–47.0)
HEMOGLOBIN: 14.9 g/dL (ref 12.0–16.0)
MCH: 29.3 pg (ref 26.0–34.0)
MCHC: 34 g/dL (ref 32.0–36.0)
MCV: 86.2 fL (ref 80.0–100.0)
PLATELETS: 229 10*3/uL (ref 150–440)
RBC: 5.09 MIL/uL (ref 3.80–5.20)
RDW: 14 % (ref 11.5–14.5)
WBC: 8.4 10*3/uL (ref 3.6–11.0)

## 2017-07-07 LAB — POCT PREGNANCY, URINE: Preg Test, Ur: NEGATIVE

## 2017-07-07 LAB — LIPASE, BLOOD: Lipase: 29 U/L (ref 11–51)

## 2017-07-07 NOTE — ED Notes (Signed)
No answer after 3rd call.

## 2017-07-07 NOTE — ED Notes (Signed)
Called to take to treatment room.  No answer

## 2017-07-07 NOTE — ED Notes (Signed)
No answer after 2nd call.

## 2017-07-07 NOTE — ED Triage Notes (Signed)
Pt has abd pain for 3 weeks.  No n/v/d.  Denies urinary sx.  No bag bleeding pt alert.

## 2017-07-31 ENCOUNTER — Telehealth: Payer: Self-pay | Admitting: Family Medicine

## 2017-07-31 NOTE — Telephone Encounter (Signed)
Patient has no-showed 3 new patient appointments at this office. Not allowed to reschedule with this office. Needs to establish elsewhere. Thanks.

## 2017-08-01 ENCOUNTER — Ambulatory Visit: Payer: Medicaid Other | Admitting: Family Medicine

## 2018-08-01 ENCOUNTER — Other Ambulatory Visit: Payer: Self-pay

## 2018-08-01 ENCOUNTER — Emergency Department
Admission: EM | Admit: 2018-08-01 | Discharge: 2018-08-01 | Disposition: A | Discharge status: Home / Self Care | Payer: Medicaid Other | Attending: Emergency Medicine | Admitting: Emergency Medicine

## 2018-08-01 DIAGNOSIS — F419 Anxiety disorder, unspecified: Secondary | ICD-10-CM | POA: Insufficient documentation

## 2018-08-01 DIAGNOSIS — F17210 Nicotine dependence, cigarettes, uncomplicated: Secondary | ICD-10-CM | POA: Diagnosis not present

## 2018-08-01 DIAGNOSIS — Z79899 Other long term (current) drug therapy: Secondary | ICD-10-CM | POA: Diagnosis not present

## 2018-08-01 DIAGNOSIS — E119 Type 2 diabetes mellitus without complications: Secondary | ICD-10-CM | POA: Diagnosis not present

## 2018-08-01 DIAGNOSIS — Z9049 Acquired absence of other specified parts of digestive tract: Secondary | ICD-10-CM | POA: Insufficient documentation

## 2018-08-01 DIAGNOSIS — R112 Nausea with vomiting, unspecified: Secondary | ICD-10-CM | POA: Diagnosis not present

## 2018-08-01 DIAGNOSIS — Z794 Long term (current) use of insulin: Secondary | ICD-10-CM | POA: Insufficient documentation

## 2018-08-01 LAB — CBC WITH DIFFERENTIAL/PLATELET
Abs Immature Granulocytes: 0.05 10*3/uL (ref 0.00–0.07)
Basophils Absolute: 0 10*3/uL (ref 0.0–0.1)
Basophils Relative: 0 %
Eosinophils Absolute: 0 10*3/uL (ref 0.0–0.5)
Eosinophils Relative: 0 %
HCT: 46 % (ref 36.0–46.0)
Hemoglobin: 15.4 g/dL — ABNORMAL HIGH (ref 12.0–15.0)
Immature Granulocytes: 0 %
Lymphocytes Relative: 30 %
Lymphs Abs: 3.5 10*3/uL (ref 0.7–4.0)
MCH: 28.7 pg (ref 26.0–34.0)
MCHC: 33.5 g/dL (ref 30.0–36.0)
MCV: 85.7 fL (ref 80.0–100.0)
Monocytes Absolute: 1.1 10*3/uL — ABNORMAL HIGH (ref 0.1–1.0)
Monocytes Relative: 9 %
Neutro Abs: 7 10*3/uL (ref 1.7–7.7)
Neutrophils Relative %: 61 %
Platelets: 255 10*3/uL (ref 150–400)
RBC: 5.37 MIL/uL — ABNORMAL HIGH (ref 3.87–5.11)
RDW: 12.3 % (ref 11.5–15.5)
Smear Review: NORMAL
WBC: 11.7 10*3/uL — ABNORMAL HIGH (ref 4.0–10.5)
nRBC: 0 % (ref 0.0–0.2)

## 2018-08-01 LAB — BASIC METABOLIC PANEL
Anion gap: 19 — ABNORMAL HIGH (ref 5–15)
BUN: 16 mg/dL (ref 6–20)
CO2: 21 mmol/L — ABNORMAL LOW (ref 22–32)
Calcium: 10.4 mg/dL — ABNORMAL HIGH (ref 8.9–10.3)
Chloride: 97 mmol/L — ABNORMAL LOW (ref 98–111)
Creatinine, Ser: 0.82 mg/dL (ref 0.44–1.00)
GFR calc Af Amer: >60 mL/min (ref >60)
GFR calc non Af Amer: >60 mL/min (ref >60)
Glucose, Bld: 301 mg/dL — ABNORMAL HIGH (ref 70–99)
Potassium: 3.6 mmol/L (ref 3.5–5.1)
Sodium: 137 mmol/L (ref 135–145)

## 2018-08-01 LAB — MAGNESIUM: Magnesium: 1.7 mg/dL (ref 1.7–2.4)

## 2018-08-01 MED ORDER — HALOPERIDOL LACTATE 5 MG/ML IJ SOLN
5.0000 mg | Freq: Once | INTRAMUSCULAR | Status: AC
  Administered 2018-08-01: 5 mg via INTRAVENOUS
  Filled 2018-08-01: qty 1

## 2018-08-01 MED ORDER — SODIUM CHLORIDE 0.9 % IV BOLUS
1000.0000 mL | Freq: Once | INTRAVENOUS | Status: AC
  Administered 2018-08-01: 17:00:00 1000 mL via INTRAVENOUS

## 2018-08-01 NOTE — ED Notes (Signed)
Pt verbalized understanding of discharge instructions. NAD at this time. 

## 2018-08-01 NOTE — ED Triage Notes (Signed)
Pt comes via EMS with pseudoseizures. Pt was having a grand-mal seizure with ems and 2mg  versed was given and 2mg  zofran IM via EMS. Pt is now AOx4. Hx of lupus and seizures. Pt is currently thrashing her chest and arms but is still AOx4 and remains in communication with this RN and EMS.

## 2018-08-01 NOTE — ED Notes (Signed)
Pt called out for blanket. This RN took blanket to room pt no longing having "seizure activity". Pt resting and comfortable.

## 2018-08-01 NOTE — Discharge Instructions (Addendum)
Please seek medical attention for any high fevers, chest pain, shortness of breath, change in behavior, persistent vomiting, bloody stool or any other new or concerning symptoms.  

## 2018-08-01 NOTE — ED Provider Notes (Signed)
[facility] Emergency Department Provider Note   ____________________________________________   I have reviewed the triage vital signs and the nursing notes.   HISTORY  Chief Complaint Nausea and vomiting  History limited by: Not Limited   HPI Rachael Gray is a 38 y.o. female who presents to the emergency department today via EMS. Patient states that her main complaint is for nausea and vomiting. This has been going on for three weeks. She states that the vomiting has been accompanied by abdominal pain. Says that EMS was called today because she also had a seizure. During my exam patient has repetetative motions which she says is her response to pain. Patient has not seen her PCP since these symptoms started. Has history of similar episodes in the past.    Records reviewed. Per medical record review patient has a history of gastroparesis, pseudoseizures, seizures.   Past Medical History:  Diagnosis Date  . Collagen vascular disease ([place])   . Diabetes mellitus without complication ([place])   . Gastroparesis   . Lupus ([place])   . Pseudoseizures   . Seizures [facility])     Patient Active Problem List   Diagnosis Date Noted  . Hypokalemia 12/24/2015  . Anemia 12/24/2015  . Diabetes ([place]) 12/24/2015  . Gastroparesis due to DM ([place])   . Acute respiratory alkalosis 12/22/2015  . Duodenitis 12/22/2015  . Vomiting 12/16/2015  . Intractable nausea and vomiting 12/10/2014  . Nausea & vomiting 12/10/2014  . Narcotic abuse ([place]) 12/09/2014  . Sepsis ([place]) 12/07/2014  . Anxiety   . Pyelonephritis 12/04/2014  . Lactic acidosis 12/04/2014    Class: Acute    Past Surgical History:  Procedure Laterality Date  . APPENDECTOMY    . CHOLECYSTECTOMY      Prior to Admission medications   Medication Sig Start Date End Date Taking? Authorizing Provider  insulin aspart (NOVOLOG) 100 UNIT/ML injection Inject 0-5 Units into the skin at bedtime. Patient taking  differently: Inject 3 Units into the skin at bedtime.  03/07/17   Epifanio Lesches, MD  insulin NPH-regular Human (NOVOLIN 70/30) (70-30) 100 UNIT/ML injection Inject 22-25 Units into the skin 2 (two) times daily with a meal. 25 units in the morning and 22 units in the evening Patient not taking: Reported on 04/30/2017 03/07/17   Epifanio Lesches, MD  levETIRAcetam (KEPPRA) 750 MG tablet Take 1 tablet (750 mg total) by mouth 2 (two) times daily. Patient not taking: Reported on 06/09/2016 08/21/15   Paulette Blanch, MD  metoCLOPramide (REGLAN) 10 MG tablet Take 1 tablet (10 mg total) by mouth every 8 (eight) hours as needed. Patient not taking: Reported on 03/06/2017 02/25/17   Loney Hering, MD  ondansetron (ZOFRAN ODT) 4 MG disintegrating tablet Take 1 tablet (4 mg total) by mouth every 8 (eight) hours as needed for nausea or vomiting. Patient not taking: Reported on 06/13/2017 04/28/17   Rudene Re, MD  potassium chloride (K-DUR) 10 MEQ tablet Take 1 tablet (10 mEq total) by mouth daily. Patient not taking: Reported on 04/30/2017 03/07/17   Epifanio Lesches, MD    Allergies Dilantin [phenytoin sodium extended]; Doxycycline; Other; Morphine and related; and Tylenol [acetaminophen]  Family History  Problem Relation Age of Onset  . Breast cancer Maternal Grandmother     Social History Social History   Tobacco Use  . Smoking status: Current Every Day Smoker    Packs/day: 0.50    Years: 10.00    Pack years: 5.00    Types:  Cigarettes  . Smokeless tobacco: Never Used  Substance Use Topics  . Alcohol use: No  . Drug use: Yes    Types: Marijuana    Comment: 3 months ago.     Review of Systems Constitutional: No fever/chills Eyes: No visual changes. ENT: No sore throat. Cardiovascular: Denies chest pain. Respiratory: Denies shortness of breath. Gastrointestinal: Positive for abdominal pain, nausea, vomiting Genitourinary: Negative for dysuria. Musculoskeletal:  Negative for back pain. Skin: Negative for rash. Neurological: Positive for seizure like activity today  ____________________________________________   PHYSICAL EXAM:  VITAL SIGNS: ED Triage Vitals  Enc Vitals Group     BP --      Pulse Rate 08/01/18 1601 (!) 117     Resp 08/01/18 1601 (!) 22     Temp --      Temp src --      SpO2 08/01/18 1601 99 %     Weight 08/01/18 1559 140 lb (63.5 kg)     Height 08/01/18 1559 5\' 5"  (1.651 m)     Head Circumference --      Peak Flow --      Pain Score 08/01/18 1559 6   Constitutional: Alert and oriented. Jerking motions.  Eyes: Conjunctivae are normal.  ENT      Head: Normocephalic and atraumatic.      Nose: No congestion/rhinnorhea.      Mouth/Throat: Mucous membranes are moist.      Neck: No stridor. Hematological/Lymphatic/Immunilogical: No cervical lymphadenopathy. Cardiovascular: Normal rate, regular rhythm.  No murmurs, rubs, or gallops.  Respiratory: Normal respiratory effort without tachypnea nor retractions. Breath sounds are clear and equal bilaterally. No wheezes/rales/rhonchi. Gastrointestinal: Soft and non tender. No rebound. No guarding.  Genitourinary: Deferred Musculoskeletal: Normal range of motion in all extremities. No lower extremity edema. Neurologic:  Normal speech and language. No gross focal neurologic deficits are appreciated.  Skin:  Skin is warm, dry and intact. No rash noted. Psychiatric: Mood and affect are normal. Speech and behavior are normal. Patient exhibits appropriate insight and judgment.  ____________________________________________    LABS (pertinent positives/negatives)  CBC wbc 11.7, hgb 15.4, plt 255 Mg 1.7 BMP na 137, k 3.6, glu 301  ____________________________________________   EKG  None  ____________________________________________     RADIOLOGY  None  ____________________________________________   PROCEDURES  Procedures  ____________________________________________   INITIAL IMPRESSION / ASSESSMENT AND PLAN / ED COURSE  Pertinent labs & imaging results that were available during my care of the patient were reviewed by me and considered in my medical decision making (see chart for details).   Patient presented because of concern for nausea, vomiting and abdominal pain. Has history of same. Patient was given ivfs and haldol and felt significant improvement. Patient's blood work did show an elevated glucose and slightly elevated anion gap. At this point though I doubt DKA given significant improvement. Patient did want discharge prior to urine results which I think is reasonable. We did discuss return precautions and importance of primary care follow up. ____________________________________________   FINAL CLINICAL IMPRESSION(S) / ED DIAGNOSES  Final diagnoses:  Nausea and vomiting, intractability of vomiting not specified, unspecified vomiting type     Note: This dictation was prepared with Dragon dictation. Any transcriptional errors that result from this process are unintentional     Nance Pear, MD 08/01/18 [phone]

## 2018-08-01 NOTE — ED Notes (Signed)
Pt refusing to keep leads, BP cuff and O2 monitor on.

## 2018-08-15 ENCOUNTER — Other Ambulatory Visit: Payer: Self-pay

## 2018-08-15 ENCOUNTER — Emergency Department: Payer: Medicaid Other

## 2018-08-15 ENCOUNTER — Encounter: Payer: Self-pay | Admitting: *Deleted

## 2018-08-15 ENCOUNTER — Emergency Department
Admission: EM | Admit: 2018-08-15 | Discharge: 2018-08-15 | Disposition: A | Discharge status: Home / Self Care | Payer: Medicaid Other | Attending: Emergency Medicine | Admitting: Emergency Medicine

## 2018-08-15 DIAGNOSIS — F17210 Nicotine dependence, cigarettes, uncomplicated: Secondary | ICD-10-CM | POA: Insufficient documentation

## 2018-08-15 DIAGNOSIS — Z794 Long term (current) use of insulin: Secondary | ICD-10-CM | POA: Insufficient documentation

## 2018-08-15 DIAGNOSIS — Z20828 Contact with and (suspected) exposure to other viral communicable diseases: Secondary | ICD-10-CM | POA: Diagnosis not present

## 2018-08-15 DIAGNOSIS — R05 Cough: Secondary | ICD-10-CM | POA: Diagnosis present

## 2018-08-15 DIAGNOSIS — N764 Abscess of vulva: Secondary | ICD-10-CM | POA: Insufficient documentation

## 2018-08-15 DIAGNOSIS — E876 Hypokalemia: Secondary | ICD-10-CM | POA: Insufficient documentation

## 2018-08-15 DIAGNOSIS — Z03818 Encounter for observation for suspected exposure to other biological agents ruled out: Secondary | ICD-10-CM | POA: Diagnosis not present

## 2018-08-15 DIAGNOSIS — R059 Cough, unspecified: Secondary | ICD-10-CM

## 2018-08-15 DIAGNOSIS — E119 Type 2 diabetes mellitus without complications: Secondary | ICD-10-CM | POA: Insufficient documentation

## 2018-08-15 LAB — COMPREHENSIVE METABOLIC PANEL
ALT: 9 U/L (ref 0–44)
AST: 14 U/L — ABNORMAL LOW (ref 15–41)
Albumin: 3.6 g/dL (ref 3.5–5.0)
Alkaline Phosphatase: 57 U/L (ref 38–126)
Anion gap: 22 — ABNORMAL HIGH (ref 5–15)
BUN: 14 mg/dL (ref 6–20)
CO2: 29 mmol/L (ref 22–32)
Calcium: 9.6 mg/dL (ref 8.9–10.3)
Chloride: 80 mmol/L — ABNORMAL LOW (ref 98–111)
Creatinine, Ser: 0.77 mg/dL (ref 0.44–1.00)
GFR calc Af Amer: >60 mL/min (ref >60)
GFR calc non Af Amer: >60 mL/min (ref >60)
Glucose, Bld: 289 mg/dL — ABNORMAL HIGH (ref 70–99)
Potassium: 2.7 mmol/L — CL (ref 3.5–5.1)
Sodium: 131 mmol/L — ABNORMAL LOW (ref 135–145)
Total Bilirubin: 1.1 mg/dL (ref 0.3–1.2)
Total Protein: 7.9 g/dL (ref 6.5–8.1)

## 2018-08-15 LAB — CBC WITH DIFFERENTIAL/PLATELET
Abs Immature Granulocytes: 0.09 10*3/uL — ABNORMAL HIGH (ref 0.00–0.07)
Basophils Absolute: 0 10*3/uL (ref 0.0–0.1)
Basophils Relative: 0 %
Eosinophils Absolute: 0 10*3/uL (ref 0.0–0.5)
Eosinophils Relative: 0 %
HCT: 41.1 % (ref 36.0–46.0)
Hemoglobin: 14 g/dL (ref 12.0–15.0)
Immature Granulocytes: 1 %
Lymphocytes Relative: 15 %
Lymphs Abs: 2.2 10*3/uL (ref 0.7–4.0)
MCH: 28.6 pg (ref 26.0–34.0)
MCHC: 34.1 g/dL (ref 30.0–36.0)
MCV: 83.9 fL (ref 80.0–100.0)
Monocytes Absolute: 1.2 10*3/uL — ABNORMAL HIGH (ref 0.1–1.0)
Monocytes Relative: 8 %
Neutro Abs: 11.1 10*3/uL — ABNORMAL HIGH (ref 1.7–7.7)
Neutrophils Relative %: 76 %
Platelets: 277 10*3/uL (ref 150–400)
RBC: 4.9 MIL/uL (ref 3.87–5.11)
RDW: 11.9 % (ref 11.5–15.5)
WBC: 14.8 10*3/uL — ABNORMAL HIGH (ref 4.0–10.5)
nRBC: 0 % (ref 0.0–0.2)

## 2018-08-15 LAB — TROPONIN I: Troponin I: <0.03 ng/mL (ref <0.03)

## 2018-08-15 MED ORDER — LIDOCAINE HCL 1 % IJ SOLN
5.0000 mL | Freq: Once | INTRAMUSCULAR | Status: AC
  Administered 2018-08-15: 5 mL
  Filled 2018-08-15: qty 10

## 2018-08-15 MED ORDER — POTASSIUM CHLORIDE CRYS ER 20 MEQ PO TBCR
40.0000 meq | EXTENDED_RELEASE_TABLET | Freq: Once | ORAL | Status: AC
  Administered 2018-08-15: 40 meq via ORAL
  Filled 2018-08-15: qty 2

## 2018-08-15 MED ORDER — POTASSIUM CHLORIDE ER 10 MEQ PO TBCR: 10.0000 meq | EXTENDED_RELEASE_TABLET | Freq: Every day | ORAL | 0 refills | Status: DC

## 2018-08-15 MED ORDER — FENTANYL CITRATE (PF) 100 MCG/2ML IJ SOLN: 50.0000 ug | Freq: Once | INTRAMUSCULAR | Status: DC

## 2018-08-15 MED ORDER — SODIUM CHLORIDE 0.9 % IV BOLUS
1000.0000 mL | Freq: Once | INTRAVENOUS | Status: AC
  Administered 2018-08-15: 1000 mL via INTRAVENOUS

## 2018-08-15 MED ORDER — FENTANYL CITRATE (PF) 100 MCG/2ML IJ SOLN
50.0000 ug | Freq: Once | INTRAMUSCULAR | Status: AC
  Administered 2018-08-15: 20:00:00 50 ug via INTRAVENOUS
  Filled 2018-08-15: qty 2

## 2018-08-15 MED ORDER — ONDANSETRON 4 MG PO TBDP
4.0000 mg | ORAL_TABLET | Freq: Once | ORAL | Status: AC
  Administered 2018-08-15: 4 mg via ORAL
  Filled 2018-08-15: qty 1

## 2018-08-15 MED ORDER — SULFAMETHOXAZOLE-TRIMETHOPRIM 800-160 MG PO TABS: 1.0000 | ORAL_TABLET | Freq: Two times a day (BID) | ORAL | 0 refills | Status: AC

## 2018-08-15 NOTE — ED Notes (Signed)
AAOx3.  Skin warm and dry.  C/O labial boil x 1 week.  States area is draining and painful.  Also c/o cough x 2 weeks.  No SOB/ DOE/  No fever.

## 2018-08-15 NOTE — ED Provider Notes (Signed)
[facility] Emergency Department Provider Note  ____________________________________________  Time seen: Approximately 7:03 PM  I have reviewed the triage vital signs and the nursing notes.   HISTORY  Chief Complaint Abscess and Cough    HPI Rachael Gray is a 38 y.o. female with a history of diabetes, presents to the emergency department with a large abscess of the right labia.  Patient states that she has had symptoms for the past week and has had chills at home.  She has never had a vaginal abscess in the past.  Patient also states that she has had productive cough for the past 2 to 3 days.  Triage note noted.  She denies shortness of breath, chest tightness or chest pain.  Patient denies sick contacts with similar symptoms. No other alleviating measures have been attempted.         Past Medical History:  Diagnosis Date  . Collagen vascular disease ([place])   . Diabetes mellitus without complication ([place])   . Gastroparesis   . Lupus ([place])   . Pseudoseizures   . Seizures [facility])     Patient Active Problem List   Diagnosis Date Noted  . Hypokalemia 12/24/2015  . Anemia 12/24/2015  . Diabetes ([place]) 12/24/2015  . Gastroparesis due to DM ([place])   . Acute respiratory alkalosis 12/22/2015  . Duodenitis 12/22/2015  . Vomiting 12/16/2015  . Intractable nausea and vomiting 12/10/2014  . Nausea & vomiting 12/10/2014  . Narcotic abuse ([place]) 12/09/2014  . Sepsis ([place]) 12/07/2014  . Anxiety   . Pyelonephritis 12/04/2014  . Lactic acidosis 12/04/2014    Class: Acute    Past Surgical History:  Procedure Laterality Date  . APPENDECTOMY    . CHOLECYSTECTOMY      Prior to Admission medications   Medication Sig Start Date End Date Taking? Authorizing Provider  insulin aspart (NOVOLOG) 100 UNIT/ML injection Inject 0-5 Units into the skin at bedtime. Patient taking differently: Inject 3 Units into the skin at bedtime.  03/07/17   Epifanio Lesches, MD   insulin NPH-regular Human (NOVOLIN 70/30) (70-30) 100 UNIT/ML injection Inject 22-25 Units into the skin 2 (two) times daily with a meal. 25 units in the morning and 22 units in the evening Patient not taking: Reported on 04/30/2017 03/07/17   Epifanio Lesches, MD  levETIRAcetam (KEPPRA) 750 MG tablet Take 1 tablet (750 mg total) by mouth 2 (two) times daily. Patient not taking: Reported on 06/09/2016 08/21/15   Paulette Blanch, MD  metoCLOPramide (REGLAN) 10 MG tablet Take 1 tablet (10 mg total) by mouth every 8 (eight) hours as needed. Patient not taking: Reported on 03/06/2017 02/25/17   Loney Hering, MD  ondansetron (ZOFRAN ODT) 4 MG disintegrating tablet Take 1 tablet (4 mg total) by mouth every 8 (eight) hours as needed for nausea or vomiting. Patient not taking: Reported on 06/13/2017 04/28/17   Rudene Re, MD  potassium chloride (K-DUR) 10 MEQ tablet Take 1 tablet (10 mEq total) by mouth daily for 4 days. 08/15/18 08/19/18  Lannie Fields, PA-C  sulfamethoxazole-trimethoprim (BACTRIM DS) 800-160 MG tablet Take 1 tablet by mouth 2 (two) times daily for 7 days. 08/15/18 08/22/18  Lannie Fields, PA-C    Allergies Dilantin [phenytoin sodium extended]; Doxycycline; Other; Morphine and related; and Tylenol [acetaminophen]  Family History  Problem Relation Age of Onset  . Breast cancer Maternal Grandmother     Social History Social History   Tobacco Use  . Smoking status: Current Every  Day Smoker    Packs/day: 0.50    Years: 10.00    Pack years: 5.00    Types: Cigarettes  . Smokeless tobacco: Never Used  Substance Use Topics  . Alcohol use: No  . Drug use: Yes    Types: Marijuana    Comment: 3 months ago.      Review of Systems  Constitutional: Patient has had chills.  Eyes: No visual changes. No discharge ENT: Patient has nasal congestion.  Cardiovascular: no chest pain. Respiratory: Patient has cough. No SOB. Gastrointestinal: No abdominal pain.  No nausea, no  vomiting.  No diarrhea.  No constipation. Genitourinary: Patient has large right labial abscess.  Musculoskeletal: Negative for musculoskeletal pain. Skin: Negative for rash, abrasions, lacerations, ecchymosis. Neurological: Negative for headaches, focal weakness or numbness.   ____________________________________________   PHYSICAL EXAM:  VITAL SIGNS: ED Triage Vitals  Enc Vitals Group     BP 08/15/18 1738 113/71     Pulse Rate 08/15/18 1738 (!) 130     Resp 08/15/18 1738 18     Temp 08/15/18 1738 98.8 F (37.1 C)     Temp Source 08/15/18 1738 Oral     SpO2 08/15/18 1738 95 %     Weight 08/15/18 1740 127 lb (57.6 kg)     Height 08/15/18 1740 5\' 5"  (1.651 m)     Head Circumference --      Peak Flow --      Pain Score 08/15/18 1740 7     Pain Loc --      Pain Edu? --      Excl. in [place]? --      Constitutional: Alert and oriented. Well appearing and in no acute distress. Eyes: Conjunctivae are normal. PERRL. EOMI. Head: Atraumatic. ENT:      Ears: TMs are pearly.       Nose: No congestion/rhinnorhea.      Mouth/Throat: Mucous membranes are moist.  Neck: No stridor.  No cervical spine tenderness to palpation.  Cardiovascular: Normal rate, regular rhythm. Normal S1 and S2.  Good peripheral circulation. Respiratory: Normal respiratory effort without tachypnea or retractions. Lungs CTAB. Good air entry to the bases with no decreased or absent breath sounds. Gastrointestinal: Bowel sounds 4 quadrants. Soft and nontender to palpation. No guarding or rigidity. No palpable masses. No distention. No CVA tenderness. Genitourinary: Patient has 3 cm by 3 cm right vaginal abscess with associated fluctuance. Musculoskeletal: Full range of motion to all extremities. No gross deformities appreciated. Neurologic:  Normal speech and language. No gross focal neurologic deficits are appreciated.  Skin:  Skin is warm, dry and intact. No rash noted. Psychiatric: Mood and affect are normal.  Speech and behavior are normal. Patient exhibits appropriate insight and judgement.   ____________________________________________   LABS (all labs ordered are listed, but only abnormal results are displayed)  Labs Reviewed  CBC WITH DIFFERENTIAL/PLATELET - Abnormal; Notable for the following components:      Result Value   WBC 14.8 (*)    Neutro Abs 11.1 (*)    Monocytes Absolute 1.2 (*)    Abs Immature Granulocytes 0.09 (*)    All other components within normal limits  COMPREHENSIVE METABOLIC PANEL - Abnormal; Notable for the following components:   Sodium 131 (*)    Potassium 2.7 (*)    Chloride 80 (*)    Glucose, Bld 289 (*)    AST 14 (*)    Anion gap 22 (*)    All other components within  normal limits  NOVEL CORONAVIRUS, NAA (HOSPITAL ORDER, SEND-OUT TO REF LAB)  TROPONIN I   ____________________________________________  EKG   ____________________________________________  RADIOLOGY I personally viewed and evaluated these images as part of my medical decision making, as well as reviewing the written report by the radiologist.    Dg Chest 1 View  Result Date: 08/15/2018 CLINICAL DATA:  Cough for 2 weeks EXAM: CHEST  1 VIEW COMPARISON:  12/16/2015 FINDINGS: The heart size and mediastinal contours are within normal limits. Both lungs are clear. The visualized skeletal structures are unremarkable. IMPRESSION: No active disease. Electronically Signed   By: Kathreen Devoid   On: 08/15/2018 20:48    ____________________________________________    PROCEDURES  Procedure(s) performed:    Procedures  INCISION AND DRAINAGE Performed by: Lannie Fields Consent: Verbal consent obtained. Risks and benefits: risks, benefits and alternatives were discussed Type: abscess  Body area: Right labia   Anesthesia: local infiltration  Incision was made with a scalpel.  Local anesthetic: lidocaine 1% without epinephrine  Anesthetic total: 3 ml  Complexity: complex Blunt  dissection to break up loculations  Drainage: purulent  Drainage amount: Copious   Packing material: 1/4 in iodoform gauze  Patient tolerance: Patient tolerated the procedure well with no immediate complications.     Medications  ondansetron (ZOFRAN-ODT) disintegrating tablet 4 mg (4 mg Oral Given 08/15/18 1928)  lidocaine (XYLOCAINE) 1 % (with pres) injection 5 mL (5 mLs Infiltration Given 08/15/18 1945)  fentaNYL (SUBLIMAZE) injection 50 mcg (50 mcg Intravenous Given 08/15/18 1945)  sodium chloride 0.9 % bolus 1,000 mL (0 mLs Intravenous Stopped 08/15/18 2213)  potassium chloride SA (K-DUR) CR tablet 40 mEq (40 mEq Oral Given 08/15/18 2050)     ____________________________________________   INITIAL IMPRESSION / ASSESSMENT AND PLAN / ED COURSE  Pertinent labs & imaging results that were available during my care of the patient were reviewed by me and considered in my medical decision making (see chart for details).  Review of the [place] CSRS was performed in accordance of the [place] prior to dispensing any controlled drugs.         Assessment and Plan:  Cough Labial abscess Hypokalemia 38 year old female presents to the emergency department with multiple medical complaints.  She has a large abscess of the right labia and is also complaining of productive cough for the past 2 to 3 days.  Differential diagnosis included labial abscess, COVID-19, community-acquired pneumonia and unspecified viral URI.  On physical exam, abscess is fluctuant and tender.  She underwent incision and drainage without complication.  She was advised to have packing removed in 24 hours.  She was discharged with Bactrim.  Patient had basic labs conducted in the emergency department.  Potassium was concerning at 2.7.  Patient was given potassium by mouth in the emergency department and she was discharged with oral potassium over the next 4 days.  EKG revealed normal sinus rhythm without ST segment elevation.   Troponin was within reference range.  Patient had leukocytosis, 14.7 which was to be expected with abscess formation in the right labia.  COVID-19 testing is pending.  Chest x-ray revealed no consolidations, opacities or infiltrates.  Tachycardia at triage improved in the emergency department with supplemental fluids.  Patient was given strict return precautions to return to the emergency department for new or worsening symptoms. All patient questions were answered.        ____________________________________________  FINAL CLINICAL IMPRESSION(S) / ED DIAGNOSES  Final diagnoses:  Cough  Labial  abscess  Hypokalemia      NEW MEDICATIONS STARTED DURING THIS VISIT:  ED Discharge Orders         Ordered    potassium chloride (K-DUR) 10 MEQ tablet  Daily     08/15/18 2207    sulfamethoxazole-trimethoprim (BACTRIM DS) 800-160 MG tablet  2 times daily     08/15/18 2356              This chart was dictated using voice recognition software/Dragon. Despite best efforts to proofread, errors can occur which can change the meaning. Any change was purely unintentional.    Lannie Fields, PA-C 08/16/18 0001    Nance Pear, MD 08/16/18 1515

## 2018-08-15 NOTE — Discharge Instructions (Signed)
Remove packing from right labial abscess 24 hours. Take Bactrim twice daily for the next week. Monitor cough at home and stay quarantined in your home for 7 days and at least 72 hours after fever resolves. You can take Tylenol for fever. I have given you a prescription for oral potassium to be taken over the next 4 days.

## 2018-08-15 NOTE — ED Triage Notes (Signed)
Per patient's report, patient has an abscess on labia that she noticed today.

## 2018-08-17 LAB — NOVEL CORONAVIRUS, NAA (HOSP ORDER, SEND-OUT TO REF LAB; TAT 18-24 HRS): SARS-CoV-2, NAA: NOT DETECTED

## 2018-08-19 ENCOUNTER — Telehealth: Payer: Self-pay | Admitting: Emergency Medicine

## 2018-08-19 NOTE — Telephone Encounter (Signed)
Called patient to give result of corona virus test.  Left message.

## 2019-06-07 ENCOUNTER — Telehealth: Payer: Self-pay | Admitting: General Practice

## 2019-06-07 NOTE — Telephone Encounter (Signed)
Rachael Gray is calling from [facility] calling to request referral face sheet with [place] NPI number Please advise CB- [phone] Fax- [phone]

## 2019-06-07 NOTE — Telephone Encounter (Signed)
Called Mel Almond and let her know that pt no showed 4 new pt appts so we can not authorize visit

## 2019-08-30 ENCOUNTER — Ambulatory Visit: Payer: Self-pay

## 2019-09-03 DIAGNOSIS — Z5181 Encounter for therapeutic drug level monitoring: Secondary | ICD-10-CM | POA: Diagnosis not present

## 2019-09-15 ENCOUNTER — Ambulatory Visit: Payer: Self-pay | Admitting: *Deleted

## 2019-09-15 DIAGNOSIS — Z5181 Encounter for therapeutic drug level monitoring: Secondary | ICD-10-CM | POA: Diagnosis not present

## 2019-09-15 NOTE — Telephone Encounter (Signed)
LVMTCB regarding cancelled appt.due to no shows and establish care with another office.

## 2019-09-15 NOTE — Telephone Encounter (Signed)
Called with concern having 2 menstrual cycles in one month. LMP on 5/9-11. And again 5/29-30 after intercourse on 5/27. Mild flow dark in color reported on 5/29 and "hardly none on panty liner" on 5/30. Care advise given. Patient has appt scheduled for 09/17/19 with PCP. Patient verbalized understanding of care advise for home.  Reason for Disposition . Bleeding or spotting occurs after sex (Exception: first intercourse)  Answer Assessment - Initial Assessment Questions 1. AMOUNT: "Describe the bleeding that you are having."    - SPOTTING: spotting, or pinkish / brownish mucous discharge; does not fill panti-liner or pad    - MILD:  less than 1 pad / hour; less than patient's usual menstrual bleeding   - MODERATE: 1-2 pads / hour; 1 menstrual cup every 6 hours; small-medium blood clots (e.g., pea, grape, small coin)   - SEVERE: soaking 2 or more pads/hour for 2 or more hours; 1 menstrual cup every 2 hours; bleeding not contained by pads or continuous red blood from vagina; large blood clots (e.g., golf ball, large coin)      mild 2. ONSET: "When did the bleeding begin?" "Is it continuing now?"     May 29 - 30. Not continuing now 3. MENSTRUAL PERIOD: "When was the last normal menstrual period?" "How is this different than your period?"      Only lasted 1-2 days LMP may 9-11. And again may 29-30. 4. REGULARITY: "How regular are your periods?"     3 -4 days regular this is the 1 time for irregular time  5. ABDOMINAL PAIN: "Do you have any pain?" "How bad is the pain?"  (e.g., Scale 1-10; mild, moderate, or severe)   - MILD (1-3): doesn't interfere with normal activities, abdomen soft and not tender to touch    - MODERATE (4-7): interferes with normal activities or awakens from sleep, tender to touch    - SEVERE (8-10): excruciating pain, doubled over, unable to do any normal activities      no 6. PREGNANCY: "Could you be pregnant?" "Are you sexually active?" "Did you recently give birth?"      Unsure  Of pregnancy . Yes sexually active . Just had intercourse may 27.  7. BREASTFEEDING: "Are you breastfeeding?"     n/a 8. HORMONES: "Are you taking any hormone medications, prescription or OTC?" (e.g., birth control pills, estrogen)     No. Patient reports she has 1 ovary 9. BLOOD THINNERS: "Do you take any blood thinners?" (e.g., Coumadin/warfarin, Pradaxa/dabigatran, aspirin)     no 10. CAUSE: "What do you think is causing the bleeding?" (e.g., recent gyn surgery, recent gyn procedure; known bleeding disorder, cervical cancer, polycystic ovarian disease, fibroids)        Unknown cause.  Hx cyst on ovary and removed only has 1 ovary now  11. HEMODYNAMIC STATUS: "Are you weak or feeling lightheaded?" If Yes, ask: "Can you stand and walk normally?"        no 12. OTHER SYMPTOMS: "What other symptoms are you having with the bleeding?" (e.g., passed tissue, vaginal discharge, fever, menstrual-type cramps)       No  Protocols used: VAGINAL BLEEDING - ABNORMAL-A-AH

## 2019-09-17 ENCOUNTER — Ambulatory Visit: Payer: Medicaid Other | Admitting: Nurse Practitioner

## 2020-01-23 ENCOUNTER — Emergency Department
Admission: EM | Admit: 2020-01-23 | Discharge: 2020-01-23 | Disposition: A | Discharge status: Home / Self Care | Payer: Medicaid Other | Attending: Emergency Medicine | Admitting: Emergency Medicine

## 2020-01-23 ENCOUNTER — Other Ambulatory Visit: Payer: Self-pay

## 2020-01-23 ENCOUNTER — Emergency Department: Payer: Medicaid Other

## 2020-01-23 DIAGNOSIS — R000 Tachycardia, unspecified: Secondary | ICD-10-CM | POA: Diagnosis not present

## 2020-01-23 DIAGNOSIS — E1143 Type 2 diabetes mellitus with diabetic autonomic (poly)neuropathy: Secondary | ICD-10-CM | POA: Insufficient documentation

## 2020-01-23 DIAGNOSIS — Z794 Long term (current) use of insulin: Secondary | ICD-10-CM | POA: Diagnosis not present

## 2020-01-23 DIAGNOSIS — F17210 Nicotine dependence, cigarettes, uncomplicated: Secondary | ICD-10-CM | POA: Diagnosis not present

## 2020-01-23 DIAGNOSIS — Z20822 Contact with and (suspected) exposure to covid-19: Secondary | ICD-10-CM | POA: Diagnosis not present

## 2020-01-23 DIAGNOSIS — K3184 Gastroparesis: Secondary | ICD-10-CM | POA: Insufficient documentation

## 2020-01-23 DIAGNOSIS — R1084 Generalized abdominal pain: Secondary | ICD-10-CM

## 2020-01-23 DIAGNOSIS — R109 Unspecified abdominal pain: Secondary | ICD-10-CM | POA: Diagnosis not present

## 2020-01-23 DIAGNOSIS — K859 Acute pancreatitis without necrosis or infection, unspecified: Secondary | ICD-10-CM | POA: Diagnosis not present

## 2020-01-23 LAB — URINALYSIS, COMPLETE (UACMP) WITH MICROSCOPIC
Bacteria, UA: NONE SEEN
Bilirubin Urine: NEGATIVE
Glucose, UA: >=500 mg/dL — AB
Hgb urine dipstick: NEGATIVE
Ketones, ur: 80 mg/dL — AB
Leukocytes,Ua: NEGATIVE
Nitrite: NEGATIVE
Protein, ur: >=300 mg/dL — AB
Specific Gravity, Urine: 1.033 — ABNORMAL HIGH (ref 1.005–1.030)
pH: 5 (ref 5.0–8.0)

## 2020-01-23 LAB — COMPREHENSIVE METABOLIC PANEL
ALT: 10 U/L (ref 0–44)
AST: 12 U/L — ABNORMAL LOW (ref 15–41)
Albumin: 4.5 g/dL (ref 3.5–5.0)
Alkaline Phosphatase: 56 U/L (ref 38–126)
Anion gap: 16 — ABNORMAL HIGH (ref 5–15)
BUN: 12 mg/dL (ref 6–20)
CO2: 23 mmol/L (ref 22–32)
Calcium: 9.6 mg/dL (ref 8.9–10.3)
Chloride: 98 mmol/L (ref 98–111)
Creatinine, Ser: 0.59 mg/dL (ref 0.44–1.00)
GFR, Estimated: >60 mL/min (ref >60)
Glucose, Bld: 382 mg/dL — ABNORMAL HIGH (ref 70–99)
Potassium: 3.7 mmol/L (ref 3.5–5.1)
Sodium: 137 mmol/L (ref 135–145)
Total Bilirubin: 1.2 mg/dL (ref 0.3–1.2)
Total Protein: 8.1 g/dL (ref 6.5–8.1)

## 2020-01-23 LAB — CBC
HCT: 42.2 % (ref 36.0–46.0)
Hemoglobin: 14.2 g/dL (ref 12.0–15.0)
MCH: 28.5 pg (ref 26.0–34.0)
MCHC: 33.6 g/dL (ref 30.0–36.0)
MCV: 84.6 fL (ref 80.0–100.0)
Platelets: 258 10*3/uL (ref 150–400)
RBC: 4.99 MIL/uL (ref 3.87–5.11)
RDW: 12.4 % (ref 11.5–15.5)
WBC: 8.2 10*3/uL (ref 4.0–10.5)
nRBC: 0 % (ref 0.0–0.2)

## 2020-01-23 LAB — LIPASE, BLOOD: Lipase: 52 U/L — ABNORMAL HIGH (ref 11–51)

## 2020-01-23 LAB — POC URINE PREG, ED: Preg Test, Ur: NEGATIVE

## 2020-01-23 MED ORDER — HYDROCODONE-IBUPROFEN 5-200 MG PO TABS: 1.0000 | ORAL_TABLET | Freq: Three times a day (TID) | ORAL | 0 refills | Status: DC | PRN

## 2020-01-23 MED ORDER — HYDROMORPHONE HCL 1 MG/ML IJ SOLN
0.5000 mg | Freq: Once | INTRAMUSCULAR | Status: AC
  Administered 2020-01-23: 0.5 mg via INTRAVENOUS
  Filled 2020-01-23: qty 1

## 2020-01-23 MED ORDER — ONDANSETRON HCL 4 MG/2ML IJ SOLN
4.0000 mg | Freq: Once | INTRAMUSCULAR | Status: AC
  Administered 2020-01-23: 4 mg via INTRAVENOUS
  Filled 2020-01-23: qty 2

## 2020-01-23 MED ORDER — IOHEXOL 300 MG/ML  SOLN
100.0000 mL | Freq: Once | INTRAMUSCULAR | Status: AC | PRN
  Administered 2020-01-23: 100 mL via INTRAVENOUS

## 2020-01-23 MED ORDER — SODIUM CHLORIDE 0.9 % IV BOLUS
1000.0000 mL | Freq: Once | INTRAVENOUS | Status: AC
  Administered 2020-01-23: 1000 mL via INTRAVENOUS

## 2020-01-23 NOTE — ED Triage Notes (Signed)
Pt states she has had back abdominal and back pain since Friday- pt states she has had n/v but no diarrhea

## 2020-01-23 NOTE — Discharge Instructions (Addendum)
You may have a very small amount of inflammation in your pancreas or pancreatitis.  Please follow the pancreatitis eating plan instructions that we have included.  Please return for any worsening pain.  Also return for fever vomiting or any other problems.  I will give you a small amount of hydrocodone if you need it for the pain.  If you do not need it do not use it.

## 2020-01-23 NOTE — ED Provider Notes (Signed)
[facility] Emergency Department Provider Note   ____________________________________________   First MD Initiated Contact with Patient 01/23/20 2104     (approximate)  I have reviewed the triage vital signs and the nursing notes.   HISTORY  Chief Complaint Abdominal Pain    HPI Rachael Gray is a 39 y.o. female who comes in shaking and tachycardic at 120 complaining of vomiting once today and having diffuse crampy and sharp abdominal pain since Friday 3 days ago.  The pain is been getting worse.  She has not had any diarrhea or fever.  She is not having any goosebumps.  She says the pain is severe possibly as much as 9 out of 10.  It is been constant since it started and diffuse throughout her belly possibly worse at the top of the belly and in the right side of the back.  There is no dysuria.  Nothing seems to make it better or worse.        Past Medical History:  Diagnosis Date  . Collagen vascular disease ([place])   . Diabetes mellitus without complication ()   . Gastroparesis   . Lupus ([place])   . Pseudoseizures ([place])   . Seizures [facility])     Patient Active Problem List   Diagnosis Date Noted  . Hypokalemia 12/24/2015  . Anemia 12/24/2015  . Diabetes ([place]) 12/24/2015  . Gastroparesis due to DM ([place])   . Acute respiratory alkalosis 12/22/2015  . Duodenitis 12/22/2015  . Vomiting 12/16/2015  . Intractable nausea and vomiting 12/10/2014  . Nausea & vomiting 12/10/2014  . Narcotic abuse ([place]) 12/09/2014  . Sepsis ([place]) 12/07/2014  . Anxiety   . Pyelonephritis 12/04/2014  . Lactic acidosis 12/04/2014    Class: Acute    Past Surgical History:  Procedure Laterality Date  . APPENDECTOMY    . CHOLECYSTECTOMY      Prior to Admission medications   Medication Sig Start Date End Date Taking? Authorizing Provider  hydrocodone-ibuprofen (VICOPROFEN) 5-200 MG tablet Take 1 tablet by mouth every 8 (eight) hours as needed for pain. 01/23/20    Nena Polio, MD  insulin aspart (NOVOLOG) 100 UNIT/ML injection Inject 0-5 Units into the skin at bedtime. Patient taking differently: Inject 3 Units into the skin at bedtime.  03/07/17   Epifanio Lesches, MD  insulin NPH-regular Human (NOVOLIN 70/30) (70-30) 100 UNIT/ML injection Inject 22-25 Units into the skin 2 (two) times daily with a meal. 25 units in the morning and 22 units in the evening Patient not taking: Reported on 04/30/2017 03/07/17   Epifanio Lesches, MD  levETIRAcetam (KEPPRA) 750 MG tablet Take 1 tablet (750 mg total) by mouth 2 (two) times daily. Patient not taking: Reported on 06/09/2016 08/21/15   Paulette Blanch, MD  metoCLOPramide (REGLAN) 10 MG tablet Take 1 tablet (10 mg total) by mouth every 8 (eight) hours as needed. Patient not taking: Reported on 03/06/2017 02/25/17   Loney Hering, MD  ondansetron (ZOFRAN ODT) 4 MG disintegrating tablet Take 1 tablet (4 mg total) by mouth every 8 (eight) hours as needed for nausea or vomiting. Patient not taking: Reported on 06/13/2017 04/28/17   Rudene Re, MD  potassium chloride (K-DUR) 10 MEQ tablet Take 1 tablet (10 mEq total) by mouth daily for 4 days. 08/15/18 08/19/18  Lannie Fields, PA-C    Allergies Dilantin [phenytoin sodium extended], Doxycycline, Other, Morphine and related, and Tylenol [acetaminophen]  Family History  Problem Relation Age of Onset  .  Breast cancer Maternal Grandmother     Social History Social History   Tobacco Use  . Smoking status: Current Every Day Smoker    Packs/day: 0.50    Years: 10.00    Pack years: 5.00    Types: Cigarettes  . Smokeless tobacco: Never Used  Vaping Use  . Vaping Use: Never used  Substance Use Topics  . Alcohol use: No  . Drug use: Yes    Types: Marijuana    Comment: 3 months ago.     Review of Systems  Constitutional: No fever/chills Eyes: No visual changes. ENT: No sore throat. Cardiovascular: Denies chest pain. Respiratory: Denies  shortness of breath. Gastrointestinal: abdominal pain.  nausea, vomiting.  No diarrhea.  No constipation. Genitourinary: Negative for dysuria. Musculoskeletal: Negative for back pain. Skin: Negative for rash. Neurological: Negative for headaches, focal weakness    ____________________________________________   PHYSICAL EXAM:  VITAL SIGNS: ED Triage Vitals  Enc Vitals Group     BP 01/23/20 1845 114/88     Pulse Rate 01/23/20 1845 (!) 126     Resp 01/23/20 1845 18     Temp 01/23/20 1845 98.5 F (36.9 C)     Temp Source 01/23/20 1845 Oral     SpO2 01/23/20 1845 97 %     Weight 01/23/20 1846 135 lb (61.2 kg)     Height 01/23/20 1846 5\' 5"  (1.651 m)     Head Circumference --      Peak Flow --      Pain Score 01/23/20 1845 10     Pain Loc --      Pain Edu? --      Excl. in [place]? --     Constitutional: Alert and oriented.  Trembling. Eyes: Conjunctivae are normal. PER. EOMI. Head: Atraumatic. Nose: No congestion/rhinnorhea. Mouth/Throat: Mucous membranes are moist.  Oropharynx non-erythematous. Neck: No stridor.  Cardiovascular: Normal rate currently monitor showing heart rate of 104, regular rhythm. Grossly normal heart sounds.  Good peripheral circulation. Respiratory: Normal respiratory effort.  No retractions. Lungs CTAB. Gastrointestinal: Somewhat firm and diffusely tender to palpation or even light percussion no distention. No abdominal bruits. Musculoskeletal: No lower extremity tenderness nor edema.   Neurologic:  Normal speech and language. No gross focal neurologic deficits are appreciated.  Skin:  Skin is warm, dry and intact.   ____________________________________________   LABS (all labs ordered are listed, but only abnormal results are displayed)  Labs Reviewed  LIPASE, BLOOD - Abnormal; Notable for the following components:      Result Value   Lipase 52 (*)    All other components within normal limits  COMPREHENSIVE METABOLIC PANEL - Abnormal; Notable for  the following components:   Glucose, Bld 382 (*)    AST 12 (*)    Anion gap 16 (*)    All other components within normal limits  URINALYSIS, COMPLETE (UACMP) WITH MICROSCOPIC - Abnormal; Notable for the following components:   Color, Urine YELLOW (*)    APPearance HAZY (*)    Specific Gravity, Urine 1.033 (*)    Glucose, UA >=500 (*)    Ketones, ur 80 (*)    Protein, ur >=300 (*)    All other components within normal limits  RESPIRATORY PANEL BY RT PCR (FLU A&B, COVID)  CBC  POC URINE PREG, ED   ____________________________________________  EKG  KG read interpreted by me shows sinus tachycardia rate of 124 normal axis nonspecific ST-T wave changes ____________________________________________  RADIOLOGY Lowella Curb  F, personally viewed and evaluated these images (plain radiographs) as part of my medical decision making, as well as reviewing the written report by the radiologist.  ED MD interpretation:    Official radiology report(s): CT ABDOMEN PELVIS W CONTRAST  Result Date: 01/23/2020 CLINICAL DATA:  Abdominal pain and back pain. EXAM: CT ABDOMEN AND PELVIS WITH CONTRAST TECHNIQUE: Multidetector CT imaging of the abdomen and pelvis was performed using the standard protocol following bolus administration of intravenous contrast. CONTRAST:  110mL OMNIPAQUE IOHEXOL 300 MG/ML  SOLN COMPARISON:  February 22, 2017 FINDINGS: Lower chest: No acute abnormality. Hepatobiliary: No focal liver abnormality is seen. Status post cholecystectomy. No biliary dilatation. Pancreas: Unremarkable. No pancreatic ductal dilatation or surrounding inflammatory changes. Spleen: Normal in size without focal abnormality. Adrenals/Urinary Tract: The right adrenal gland is normal in size and appearance. A stable 1.2 cm low-attenuation left adrenal mass is seen. Kidneys are normal in size, without focal lesions. A 3 mm nonobstructing renal stone is seen within the mid left kidney. Bladder is unremarkable.  Stomach/Bowel: Stomach is within normal limits. Appendix appears normal. No evidence of bowel wall thickening, distention, or inflammatory changes. Vascular/Lymphatic: There is moderate severity calcification of the abdominal aorta and bilateral common iliac arteries, without evidence of aneurysmal dilatation. No enlarged abdominal or pelvic lymph nodes. Reproductive: The uterus is unremarkable. A 1.1 cm x 1.2 cm left adnexal cyst is seen. Other: No abdominal wall hernia or abnormality. No abdominopelvic ascites. Musculoskeletal: Mild subcutaneous inflammatory fat stranding is seen adjacent to the posterior aspect of the coccyx, to the left of midline. No acute or significant osseous findings. IMPRESSION: 1. 3 mm nonobstructing renal stone within the left kidney. 2. Small low-attenuation left adrenal mass which likely represents an adrenal adenoma. 3. 1.1 cm x 1.2 cm left adnexal cyst, likely ovarian in origin. 4. Aortic atherosclerosis. Aortic Atherosclerosis (ICD10-I70.0). Electronically Signed   By: Virgina Norfolk M.D.   On: 01/23/2020 21:43    ____________________________________________   PROCEDURES  Procedure(s) performed (including Critical Care):  Procedures   ____________________________________________   INITIAL IMPRESSION / ASSESSMENT AND PLAN / ED COURSE  Patient CT is essentially negative.  Lab work is okay except for the very minimal elevation of lipase.  It is possible that the patient was having a very mild attack of pancreatitis or possibly withdrawing.  Her symptoms essentially resolved with 1 dose of half a milligram of Dilaudid.  Patient reports she has an allergy to morphine but can take other pain medicines.  I asked her about Vicodin and she is okay with that.  I have warned her about the chances of getting addicted to Vicodin.  Family is with her and will watch her.  I will give her 8 Vicodin 1 3 times a day as needed for severe pain and not to use any Vicodin if she does  not need it.  She agrees.  She will return for any further problems like worsening pain fever vomiting etc.             ____________________________________________   FINAL CLINICAL IMPRESSION(S) / ED DIAGNOSES  Final diagnoses:  Generalized abdominal pain  Acute pancreatitis without infection or necrosis, unspecified pancreatitis type     ED Discharge Orders         Ordered    hydrocodone-ibuprofen (VICOPROFEN) 5-200 MG tablet  Every 8 hours PRN        01/23/20 2241          *Please note:  Linder L Dufresne  was evaluated in Emergency Department on 01/23/2020 for the symptoms described in the history of present illness. She was evaluated in the context of the global COVID-19 pandemic, which necessitated consideration that the patient might be at risk for infection with the SARS-CoV-2 virus that causes COVID-19. Institutional protocols and algorithms that pertain to the evaluation of patients at risk for COVID-19 are in a state of rapid change based on information released by regulatory bodies including the CDC and federal and state organizations. These policies and algorithms were followed during the patient's care in the ED.  Some ED evaluations and interventions may be delayed as a result of limited staffing during and the pandemic.*   Note:  This document was prepared using Dragon voice recognition software and may include unintentional dictation errors.    Nena Polio, MD 01/23/20 2243

## 2020-01-23 NOTE — ED Notes (Signed)
Follow up pcp info provided all questions answered all dc instructions understood per pt

## 2020-01-24 ENCOUNTER — Other Ambulatory Visit: Payer: Self-pay

## 2020-01-24 ENCOUNTER — Emergency Department
Admission: EM | Admit: 2020-01-24 | Discharge: 2020-01-25 | Disposition: A | Discharge status: Home / Self Care | Payer: Medicaid Other | Source: Home / Self Care

## 2020-01-24 DIAGNOSIS — Z5321 Procedure and treatment not carried out due to patient leaving prior to being seen by health care provider: Secondary | ICD-10-CM | POA: Insufficient documentation

## 2020-01-24 DIAGNOSIS — R000 Tachycardia, unspecified: Secondary | ICD-10-CM | POA: Diagnosis not present

## 2020-01-24 DIAGNOSIS — K859 Acute pancreatitis without necrosis or infection, unspecified: Secondary | ICD-10-CM | POA: Insufficient documentation

## 2020-01-24 DIAGNOSIS — R52 Pain, unspecified: Secondary | ICD-10-CM | POA: Diagnosis not present

## 2020-01-24 LAB — CBC
HCT: 41 % (ref 36.0–46.0)
Hemoglobin: 13.9 g/dL (ref 12.0–15.0)
MCH: 28.5 pg (ref 26.0–34.0)
MCHC: 33.9 g/dL (ref 30.0–36.0)
MCV: 84.2 fL (ref 80.0–100.0)
Platelets: 245 10*3/uL (ref 150–400)
RBC: 4.87 MIL/uL (ref 3.87–5.11)
RDW: 12.4 % (ref 11.5–15.5)
WBC: 8.9 10*3/uL (ref 4.0–10.5)
nRBC: 0 % (ref 0.0–0.2)

## 2020-01-24 LAB — COMPREHENSIVE METABOLIC PANEL
ALT: 10 U/L (ref 0–44)
AST: 14 U/L — ABNORMAL LOW (ref 15–41)
Albumin: 4.4 g/dL (ref 3.5–5.0)
Alkaline Phosphatase: 55 U/L (ref 38–126)
Anion gap: 15 (ref 5–15)
BUN: 10 mg/dL (ref 6–20)
CO2: 22 mmol/L (ref 22–32)
Calcium: 9.6 mg/dL (ref 8.9–10.3)
Chloride: 97 mmol/L — ABNORMAL LOW (ref 98–111)
Creatinine, Ser: 0.64 mg/dL (ref 0.44–1.00)
GFR, Estimated: >60 mL/min (ref >60)
Glucose, Bld: 292 mg/dL — ABNORMAL HIGH (ref 70–99)
Potassium: 3.9 mmol/L (ref 3.5–5.1)
Sodium: 134 mmol/L — ABNORMAL LOW (ref 135–145)
Total Bilirubin: 1.3 mg/dL — ABNORMAL HIGH (ref 0.3–1.2)
Total Protein: 8.3 g/dL — ABNORMAL HIGH (ref 6.5–8.1)

## 2020-01-24 LAB — RESPIRATORY PANEL BY RT PCR (FLU A&B, COVID)
Influenza A by PCR: NEGATIVE
Influenza B by PCR: NEGATIVE
SARS Coronavirus 2 by RT PCR: NEGATIVE

## 2020-01-24 LAB — LIPASE, BLOOD: Lipase: 43 U/L (ref 11–51)

## 2020-01-24 MED ORDER — OXYCODONE HCL 5 MG PO TABS
5.0000 mg | ORAL_TABLET | ORAL | Status: AC
  Administered 2020-01-24: 5 mg via ORAL
  Filled 2020-01-24: qty 1

## 2020-01-24 MED ORDER — ONDANSETRON 4 MG PO TBDP
4.0000 mg | ORAL_TABLET | Freq: Once | ORAL | Status: AC | PRN
  Administered 2020-01-24: 4 mg via ORAL
  Filled 2020-01-24: qty 1

## 2020-01-24 NOTE — ED Notes (Signed)
Pt to STAT desk, requesting water to drink; pt informed due to her vomiting that she should remain NPO until eval further by the provider

## 2020-01-24 NOTE — ED Triage Notes (Signed)
PT to ED via EMS from home. PT was dx with pancreatitis here on the 30th. Tried to fill her pain RX but the pharmacy was out and would not have more for a few days. PT tried tylenol, icy hot and lidocaine patches with no relief.

## 2020-01-25 ENCOUNTER — Inpatient Hospital Stay
Admit: 2020-01-25 | Discharge: 2020-01-25 | Disposition: A | Discharge status: Home / Self Care | Payer: Medicaid Other | Attending: Internal Medicine | Admitting: Internal Medicine

## 2020-01-25 ENCOUNTER — Inpatient Hospital Stay
Admission: EM | Admit: 2020-01-25 | Discharge: 2020-01-28 | DRG: 638 | Disposition: A | Discharge status: Home / Self Care | Payer: Medicaid Other | Attending: Internal Medicine | Admitting: Internal Medicine

## 2020-01-25 ENCOUNTER — Other Ambulatory Visit: Payer: Self-pay

## 2020-01-25 DIAGNOSIS — Z720 Tobacco use: Secondary | ICD-10-CM | POA: Diagnosis not present

## 2020-01-25 DIAGNOSIS — Z886 Allergy status to analgesic agent status: Secondary | ICD-10-CM | POA: Diagnosis not present

## 2020-01-25 DIAGNOSIS — Z888 Allergy status to other drugs, medicaments and biological substances status: Secondary | ICD-10-CM | POA: Diagnosis not present

## 2020-01-25 DIAGNOSIS — Z794 Long term (current) use of insulin: Secondary | ICD-10-CM

## 2020-01-25 DIAGNOSIS — R112 Nausea with vomiting, unspecified: Secondary | ICD-10-CM | POA: Diagnosis not present

## 2020-01-25 DIAGNOSIS — Z79899 Other long term (current) drug therapy: Secondary | ICD-10-CM

## 2020-01-25 DIAGNOSIS — K2980 Duodenitis without bleeding: Secondary | ICD-10-CM | POA: Diagnosis present

## 2020-01-25 DIAGNOSIS — M329 Systemic lupus erythematosus, unspecified: Secondary | ICD-10-CM | POA: Diagnosis not present

## 2020-01-25 DIAGNOSIS — F1220 Cannabis dependence, uncomplicated: Secondary | ICD-10-CM | POA: Diagnosis present

## 2020-01-25 DIAGNOSIS — Z881 Allergy status to other antibiotic agents status: Secondary | ICD-10-CM

## 2020-01-25 DIAGNOSIS — I248 Other forms of acute ischemic heart disease: Secondary | ICD-10-CM | POA: Diagnosis not present

## 2020-01-25 DIAGNOSIS — R0789 Other chest pain: Secondary | ICD-10-CM | POA: Diagnosis present

## 2020-01-25 DIAGNOSIS — E860 Dehydration: Secondary | ICD-10-CM

## 2020-01-25 DIAGNOSIS — F419 Anxiety disorder, unspecified: Secondary | ICD-10-CM | POA: Diagnosis not present

## 2020-01-25 DIAGNOSIS — E1143 Type 2 diabetes mellitus with diabetic autonomic (poly)neuropathy: Secondary | ICD-10-CM | POA: Diagnosis not present

## 2020-01-25 DIAGNOSIS — I314 Cardiac tamponade: Secondary | ICD-10-CM | POA: Diagnosis not present

## 2020-01-25 DIAGNOSIS — F17210 Nicotine dependence, cigarettes, uncomplicated: Secondary | ICD-10-CM | POA: Diagnosis not present

## 2020-01-25 DIAGNOSIS — E1110 Type 2 diabetes mellitus with ketoacidosis without coma: Principal | ICD-10-CM | POA: Diagnosis present

## 2020-01-25 DIAGNOSIS — Z885 Allergy status to narcotic agent status: Secondary | ICD-10-CM

## 2020-01-25 DIAGNOSIS — R569 Unspecified convulsions: Secondary | ICD-10-CM | POA: Diagnosis present

## 2020-01-25 DIAGNOSIS — E876 Hypokalemia: Secondary | ICD-10-CM | POA: Diagnosis present

## 2020-01-25 DIAGNOSIS — R6510 Systemic inflammatory response syndrome (SIRS) of non-infectious origin without acute organ dysfunction: Secondary | ICD-10-CM | POA: Diagnosis not present

## 2020-01-25 DIAGNOSIS — R55 Syncope and collapse: Secondary | ICD-10-CM | POA: Diagnosis not present

## 2020-01-25 DIAGNOSIS — Z20822 Contact with and (suspected) exposure to covid-19: Secondary | ICD-10-CM | POA: Diagnosis present

## 2020-01-25 DIAGNOSIS — R7989 Other specified abnormal findings of blood chemistry: Secondary | ICD-10-CM | POA: Diagnosis not present

## 2020-01-25 DIAGNOSIS — K3184 Gastroparesis: Secondary | ICD-10-CM | POA: Diagnosis not present

## 2020-01-25 LAB — LACTIC ACID, PLASMA
Lactic Acid, Venous: 1.8 mmol/L (ref 0.5–1.9)
Lactic Acid, Venous: 2.4 mmol/L (ref 0.5–1.9)

## 2020-01-25 LAB — CBC
HCT: 45.6 % (ref 36.0–46.0)
Hemoglobin: 14.9 g/dL (ref 12.0–15.0)
MCH: 27.6 pg (ref 26.0–34.0)
MCHC: 32.7 g/dL (ref 30.0–36.0)
MCV: 84.4 fL (ref 80.0–100.0)
Platelets: 303 10*3/uL (ref 150–400)
RBC: 5.4 MIL/uL — ABNORMAL HIGH (ref 3.87–5.11)
RDW: 12.5 % (ref 11.5–15.5)
WBC: 18.9 10*3/uL — ABNORMAL HIGH (ref 4.0–10.5)
nRBC: 0 % (ref 0.0–0.2)

## 2020-01-25 LAB — URINALYSIS, COMPLETE (UACMP) WITH MICROSCOPIC
Bacteria, UA: NONE SEEN
Bilirubin Urine: NEGATIVE
Glucose, UA: >=500 mg/dL — AB
Ketones, ur: 80 mg/dL — AB
Leukocytes,Ua: NEGATIVE
Nitrite: NEGATIVE
Protein, ur: >=300 mg/dL — AB
Specific Gravity, Urine: 1.02 (ref 1.005–1.030)
pH: 5 (ref 5.0–8.0)

## 2020-01-25 LAB — CBG MONITORING, ED
Glucose-Capillary: 377 mg/dL — ABNORMAL HIGH (ref 70–99)
Glucose-Capillary: 308 mg/dL — ABNORMAL HIGH (ref 70–99)
Glucose-Capillary: 138 mg/dL — ABNORMAL HIGH (ref 70–99)
Glucose-Capillary: 169 mg/dL — ABNORMAL HIGH (ref 70–99)

## 2020-01-25 LAB — BASIC METABOLIC PANEL
Anion gap: 17 — ABNORMAL HIGH (ref 5–15)
BUN: 15 mg/dL (ref 6–20)
CO2: 19 mmol/L — ABNORMAL LOW (ref 22–32)
Calcium: 9.1 mg/dL (ref 8.9–10.3)
Chloride: 104 mmol/L (ref 98–111)
Creatinine, Ser: 0.61 mg/dL (ref 0.44–1.00)
GFR, Estimated: >60 mL/min (ref >60)
Glucose, Bld: 123 mg/dL — ABNORMAL HIGH (ref 70–99)
Potassium: 3.6 mmol/L (ref 3.5–5.1)
Sodium: 140 mmol/L (ref 135–145)

## 2020-01-25 LAB — HIV ANTIBODY (ROUTINE TESTING W REFLEX): HIV Screen 4th Generation wRfx: NONREACTIVE

## 2020-01-25 LAB — HCG, QUANTITATIVE, PREGNANCY
hCG, Beta Chain, Quant, S: 1 m[IU]/mL (ref <5)
hCG, Beta Chain, Quant, S: 1 m[IU]/mL (ref <5)

## 2020-01-25 LAB — COMPREHENSIVE METABOLIC PANEL
ALT: 13 U/L (ref 0–44)
AST: 16 U/L (ref 15–41)
Albumin: 5 g/dL (ref 3.5–5.0)
Alkaline Phosphatase: 61 U/L (ref 38–126)
Anion gap: 27 — ABNORMAL HIGH (ref 5–15)
BUN: 19 mg/dL (ref 6–20)
CO2: 16 mmol/L — ABNORMAL LOW (ref 22–32)
Calcium: 10.3 mg/dL (ref 8.9–10.3)
Chloride: 95 mmol/L — ABNORMAL LOW (ref 98–111)
Creatinine, Ser: 0.89 mg/dL (ref 0.44–1.00)
GFR, Estimated: >60 mL/min (ref >60)
Glucose, Bld: 416 mg/dL — ABNORMAL HIGH (ref 70–99)
Potassium: 3.5 mmol/L (ref 3.5–5.1)
Sodium: 138 mmol/L (ref 135–145)
Total Bilirubin: 1.6 mg/dL — ABNORMAL HIGH (ref 0.3–1.2)
Total Protein: 9.3 g/dL — ABNORMAL HIGH (ref 6.5–8.1)

## 2020-01-25 LAB — URINE DRUG SCREEN, QUALITATIVE (ARMC ONLY)
Amphetamines, Ur Screen: NOT DETECTED
Barbiturates, Ur Screen: NOT DETECTED
Benzodiazepine, Ur Scrn: NOT DETECTED
Cannabinoid 50 Ng, Ur ~~LOC~~: POSITIVE — AB
Cocaine Metabolite,Ur ~~LOC~~: NOT DETECTED
MDMA (Ecstasy)Ur Screen: NOT DETECTED
Methadone Scn, Ur: NOT DETECTED
Opiate, Ur Screen: NOT DETECTED
Phencyclidine (PCP) Ur S: NOT DETECTED
Tricyclic, Ur Screen: NOT DETECTED

## 2020-01-25 LAB — PHOSPHORUS: Phosphorus: 3.8 mg/dL (ref 2.5–4.6)

## 2020-01-25 LAB — RESPIRATORY PANEL BY RT PCR (FLU A&B, COVID)
Influenza A by PCR: NEGATIVE
Influenza B by PCR: NEGATIVE
SARS Coronavirus 2 by RT PCR: NEGATIVE

## 2020-01-25 LAB — BETA-HYDROXYBUTYRIC ACID
Beta-Hydroxybutyric Acid: >8 mmol/L — ABNORMAL HIGH (ref 0.05–0.27)
Beta-Hydroxybutyric Acid: 4.4 mmol/L — ABNORMAL HIGH (ref 0.05–0.27)

## 2020-01-25 LAB — BLOOD GAS, VENOUS
Acid-base deficit: 5.8 mmol/L — ABNORMAL HIGH (ref 0.0–2.0)
Bicarbonate: 19.5 mmol/L — ABNORMAL LOW (ref 20.0–28.0)
O2 Saturation: 70.5 %
Patient temperature: 37
pCO2, Ven: 37 mmHg — ABNORMAL LOW (ref 44.0–60.0)
pH, Ven: 7.33 (ref 7.250–7.430)
pO2, Ven: 40 mmHg (ref 32.0–45.0)

## 2020-01-25 LAB — GLUCOSE, CAPILLARY
Glucose-Capillary: 103 mg/dL — ABNORMAL HIGH (ref 70–99)
Glucose-Capillary: 140 mg/dL — ABNORMAL HIGH (ref 70–99)
Glucose-Capillary: 143 mg/dL — ABNORMAL HIGH (ref 70–99)
Glucose-Capillary: 153 mg/dL — ABNORMAL HIGH (ref 70–99)
Glucose-Capillary: 153 mg/dL — ABNORMAL HIGH (ref 70–99)
Glucose-Capillary: 177 mg/dL — ABNORMAL HIGH (ref 70–99)

## 2020-01-25 LAB — MAGNESIUM: Magnesium: 1.8 mg/dL (ref 1.7–2.4)

## 2020-01-25 LAB — HEMOGLOBIN A1C
Hgb A1c MFr Bld: 11.8 % — ABNORMAL HIGH (ref 4.8–5.6)
Mean Plasma Glucose: 291.96 mg/dL

## 2020-01-25 LAB — TROPONIN I (HIGH SENSITIVITY)
Troponin I (High Sensitivity): 220 ng/L (ref <18)
Troponin I (High Sensitivity): 125 ng/L (ref <18)
Troponin I (High Sensitivity): 581 ng/L (ref <18)

## 2020-01-25 LAB — LIPASE, BLOOD: Lipase: 27 U/L (ref 11–51)

## 2020-01-25 LAB — MRSA PCR SCREENING: MRSA by PCR: NEGATIVE

## 2020-01-25 LAB — PROCALCITONIN: Procalcitonin: <0.1 ng/mL

## 2020-01-25 MED ORDER — LEVETIRACETAM 750 MG PO TABS
750.0000 mg | ORAL_TABLET | Freq: Two times a day (BID) | ORAL | Status: DC
  Administered 2020-01-25: 750 mg via ORAL
  Filled 2020-01-25 (×4): qty 1

## 2020-01-25 MED ORDER — DEXTROSE IN LACTATED RINGERS 5 % IV SOLN: INTRAVENOUS | Status: DC

## 2020-01-25 MED ORDER — FENTANYL CITRATE (PF) 100 MCG/2ML IJ SOLN
50.0000 ug | Freq: Once | INTRAMUSCULAR | Status: AC
  Administered 2020-01-25: 50 ug via INTRAVENOUS
  Filled 2020-01-25: qty 2

## 2020-01-25 MED ORDER — ENOXAPARIN SODIUM 60 MG/0.6ML ~~LOC~~ SOLN
1.0000 mg/kg | Freq: Two times a day (BID) | SUBCUTANEOUS | Status: DC
  Filled 2020-01-25 (×2): qty 0.6

## 2020-01-25 MED ORDER — ASPIRIN EC 81 MG PO TBEC
81.0000 mg | DELAYED_RELEASE_TABLET | Freq: Every day | ORAL | Status: DC
  Administered 2020-01-25 – 2020-01-28 (×3): 81 mg via ORAL
  Filled 2020-01-25 (×3): qty 1

## 2020-01-25 MED ORDER — THIAMINE HCL 100 MG PO TABS
100.0000 mg | ORAL_TABLET | Freq: Every day | ORAL | Status: DC
  Administered 2020-01-27 – 2020-01-28 (×2): 100 mg via ORAL
  Filled 2020-01-25 (×3): qty 1

## 2020-01-25 MED ORDER — LACTATED RINGERS IV SOLN: INTRAVENOUS | Status: DC

## 2020-01-25 MED ORDER — PANTOPRAZOLE SODIUM 40 MG IV SOLR
40.0000 mg | Freq: Two times a day (BID) | INTRAVENOUS | Status: DC
  Administered 2020-01-25 – 2020-01-27 (×5): 40 mg via INTRAVENOUS
  Filled 2020-01-25 (×5): qty 40

## 2020-01-25 MED ORDER — LORAZEPAM 2 MG/ML IJ SOLN
1.0000 mg | Freq: Once | INTRAMUSCULAR | Status: AC
  Administered 2020-01-25: 1 mg via INTRAVENOUS
  Filled 2020-01-25: qty 1

## 2020-01-25 MED ORDER — SODIUM CHLORIDE 0.9 % IV SOLN
75.0000 mL/h | INTRAVENOUS | Status: DC
  Administered 2020-01-25: 75 mL/h via INTRAVENOUS

## 2020-01-25 MED ORDER — ATORVASTATIN CALCIUM 20 MG PO TABS
40.0000 mg | ORAL_TABLET | Freq: Every day | ORAL | Status: DC
  Administered 2020-01-25 – 2020-01-27 (×3): 40 mg via ORAL
  Filled 2020-01-25 (×3): qty 2

## 2020-01-25 MED ORDER — LACTATED RINGERS IV BOLUS
1000.0000 mL | Freq: Once | INTRAVENOUS | Status: AC
  Administered 2020-01-25: 1000 mL via INTRAVENOUS

## 2020-01-25 MED ORDER — HEPARIN SODIUM (PORCINE) 5000 UNIT/ML IJ SOLN
5000.0000 [IU] | Freq: Three times a day (TID) | INTRAMUSCULAR | Status: DC
  Administered 2020-01-25 – 2020-01-27 (×5): 5000 [IU] via SUBCUTANEOUS
  Filled 2020-01-25 (×5): qty 1

## 2020-01-25 MED ORDER — METOCLOPRAMIDE HCL 10 MG PO TABS
10.0000 mg | ORAL_TABLET | Freq: Three times a day (TID) | ORAL | Status: DC | PRN
  Administered 2020-01-25: 10 mg via ORAL
  Filled 2020-01-25 (×3): qty 1

## 2020-01-25 MED ORDER — POTASSIUM CHLORIDE 10 MEQ/100ML IV SOLN
10.0000 meq | INTRAVENOUS | Status: AC
  Administered 2020-01-25: 10 meq via INTRAVENOUS
  Filled 2020-01-25: qty 100

## 2020-01-25 MED ORDER — KETOROLAC TROMETHAMINE 30 MG/ML IJ SOLN
30.0000 mg | Freq: Once | INTRAMUSCULAR | Status: AC
  Administered 2020-01-25: 30 mg via INTRAVENOUS
  Filled 2020-01-25: qty 1

## 2020-01-25 MED ORDER — DEXTROSE 50 % IV SOLN: 0.0000 mL | INTRAVENOUS | Status: DC | PRN

## 2020-01-25 MED ORDER — PROMETHAZINE HCL 25 MG/ML IJ SOLN
12.5000 mg | Freq: Four times a day (QID) | INTRAMUSCULAR | Status: DC | PRN
  Administered 2020-01-25 – 2020-01-27 (×3): 12.5 mg via INTRAVENOUS
  Filled 2020-01-25 (×3): qty 1

## 2020-01-25 MED ORDER — INSULIN REGULAR(HUMAN) IN NACL 100-0.9 UT/100ML-% IV SOLN
INTRAVENOUS | Status: DC
  Administered 2020-01-25: 13 [IU]/h via INTRAVENOUS
  Filled 2020-01-25: qty 100

## 2020-01-25 MED ORDER — DROPERIDOL 2.5 MG/ML IJ SOLN
2.5000 mg | Freq: Once | INTRAMUSCULAR | Status: AC
  Administered 2020-01-25: 2.5 mg via INTRAVENOUS
  Filled 2020-01-25: qty 2

## 2020-01-25 MED ORDER — LACTATED RINGERS IV BOLUS
20.0000 mL/kg | Freq: Once | INTRAVENOUS | Status: AC
  Administered 2020-01-25: 1224 mL via INTRAVENOUS

## 2020-01-25 MED ORDER — SODIUM CHLORIDE 0.9 % IV BOLUS
250.0000 mL | Freq: Once | INTRAVENOUS | Status: AC
  Administered 2020-01-26: 250 mL via INTRAVENOUS

## 2020-01-25 MED ORDER — PROMETHAZINE HCL 25 MG PO TABS
12.5000 mg | ORAL_TABLET | Freq: Four times a day (QID) | ORAL | Status: DC | PRN
  Filled 2020-01-25: qty 1

## 2020-01-25 MED ORDER — NICOTINE 21 MG/24HR TD PT24
21.0000 mg | MEDICATED_PATCH | Freq: Every day | TRANSDERMAL | Status: DC
  Administered 2020-01-27: 21 mg via TRANSDERMAL
  Filled 2020-01-25 (×4): qty 1

## 2020-01-25 MED ORDER — HEPARIN SODIUM (PORCINE) 5000 UNIT/ML IJ SOLN
5000.0000 [IU] | Freq: Three times a day (TID) | INTRAMUSCULAR | Status: DC
  Filled 2020-01-25: qty 1

## 2020-01-25 MED ORDER — ONDANSETRON 4 MG PO TBDP
4.0000 mg | ORAL_TABLET | Freq: Once | ORAL | Status: AC | PRN
  Administered 2020-01-25: 4 mg via ORAL
  Filled 2020-01-25: qty 1

## 2020-01-25 NOTE — ED Triage Notes (Signed)
Pt reports recent diagnosis of pancreatitis, states that she cont to vomit and states that her abd cont to hurt, pt observed vomiting during registration process, pt reports her blood sugar yesterday was 273. Denies hx of dka, pt is tachycardic in triage at 140

## 2020-01-25 NOTE — Progress Notes (Signed)
MEDICATION RELATED CONSULT NOTE - INITIAL   Pharmacy Consult for AED Monitoring and DDIs   Allergies  Allergen Reactions  . Dilantin [Phenytoin Sodium Extended] Hives  . Doxycycline Swelling  . Other   . Morphine And Related Rash  . Tylenol [Acetaminophen] Rash    Patient Measurements: Height: 5\' 5"  (165.1 cm) Weight: 61.2 kg (135 lb) IBW/kg (Calculated) : 57   Vital Signs: Temp: 97.8 F (36.6 C) (11/02 0958) Temp Source: Oral (11/02 0958) BP: 130/82 (11/02 1000) Pulse Rate: 83 (11/02 1000) Intake/Output from previous day: No intake/output data recorded. Intake/Output from this shift: Total I/O In: 2000 [IV Piggyback:2000] Out: -   Labs: Recent Labs    01/23/20 1854 01/24/20 2140 01/25/20 0851  WBC 8.2 8.9 18.9*  HGB 14.2 13.9 14.9  HCT 42.2 41.0 45.6  PLT 258 245 303  CREATININE 0.59 0.64 0.89  ALBUMIN 4.5 4.4 5.0  PROT 8.1 8.3* 9.3*  AST 12* 14* 16  ALT 10 10 13   ALKPHOS 56 55 61  BILITOT 1.2 1.3* 1.6*   Estimated Creatinine Clearance: 77.1 mL/min (by C-G formula based on SCr of 0.89 mg/dL).   Microbiology: Recent Results (from the past 720 hour(s))  Respiratory Panel by RT PCR (Flu A&B, Covid) - Nasopharyngeal Swab     Status: None   Collection Time: 01/23/20 10:24 PM   Specimen: Nasopharyngeal Swab  Result Value Ref Range Status   SARS Coronavirus 2 by RT PCR NEGATIVE NEGATIVE Final    Comment: (NOTE) SARS-CoV-2 target nucleic acids are NOT DETECTED.  The SARS-CoV-2 RNA is generally detectable in upper respiratoy specimens during the acute phase of infection. The lowest concentration of SARS-CoV-2 viral copies this assay can detect is 131 copies/mL. A negative result does not preclude SARS-Cov-2 infection and should not be used as the sole basis for treatment or other patient management decisions. A negative result may occur with  improper specimen collection/handling, submission of specimen other than nasopharyngeal swab, presence of viral  mutation(s) within the areas targeted by this assay, and inadequate number of viral copies (<131 copies/mL). A negative result must be combined with clinical observations, patient history, and epidemiological information. The expected result is Negative.  Fact Sheet for Patients:  PinkCheek.be  Fact Sheet for Healthcare Providers:  GravelBags.it  This test is no t yet approved or cleared by the [place] FDA and  has been authorized for detection and/or diagnosis of SARS-CoV-2 by FDA under an Emergency Use Authorization (EUA). This EUA will remain  in effect (meaning this test can be used) for the duration of the COVID-19 declaration under Section 564(b)(1) of the Act, 21 U.S.C. section 360bbb-3(b)(1), unless the authorization is terminated or revoked sooner.     Influenza A by PCR NEGATIVE NEGATIVE Final   Influenza B by PCR NEGATIVE NEGATIVE Final    Comment: (NOTE) The Xpert Xpress SARS-CoV-2/FLU/RSV assay is intended as an aid in  the diagnosis of influenza from Nasopharyngeal swab specimens and  should not be used as a sole basis for treatment. Nasal washings and  aspirates are unacceptable for Xpert Xpress SARS-CoV-2/FLU/RSV  testing.  Fact Sheet for Patients: PinkCheek.be  Fact Sheet for Healthcare Providers: GravelBags.it  This test is not yet approved or cleared by the [place] FDA and  has been authorized for detection and/or diagnosis of SARS-CoV-2 by  FDA under an Emergency Use Authorization (EUA). This EUA will remain  in effect (meaning this test can be used) for the duration of the  Covid-19  declaration under Section 564(b)(1) of the Act, 21  U.S.C. section 360bbb-3(b)(1), unless the authorization is  terminated or revoked. Performed at [facility], [address] [place], [place] [address]     Medical History: Past  Medical History:  Diagnosis Date  . Collagen vascular disease ([place])   . Diabetes mellitus without complication ([place])   . Gastroparesis   . Lupus ([place])   . Pseudoseizures ([employer])   . Seizures [facility])    Assessment: 39 yo female admitted with DKA w/ PMH of seizures.  Pharmacy consulted for drug interaction and monitoring of antiepileptic medications   Plan:  Pharmacy has reviewed patients current meds and did not identify any DDI at this time.  Pharmacy will continue to review daily.  Renda Rolls, PharmD, [facility] 01/25/2020 1:34 PM

## 2020-01-25 NOTE — Assessment & Plan Note (Signed)
Pt uses [facility] and is dependant .

## 2020-01-25 NOTE — H&P (Addendum)
History and Physical    Rachael Gray YOV:[id number] DOB: 09/11/1980 DOA: 01/25/2020  PCP: Patient, No Pcp Per    Patient coming from:  home   Chief Complaint:  DKA   HPI: Rachael Gray is a 39 y.o. female with medical history significant of DM, seizures, Lupus, Gastroparesis, seen in ed for   ED Course:  Vitals:   01/25/20 1700 01/25/20 1730 01/25/20 1800 01/25/20 1847  BP: 139/65 (!) 145/65  (!) 146/80  Pulse: (!) 117 (!) 110  (!) 110  Resp: (!) 22 (!) 22  (!) 24  Temp:    98.7 F (37.1 C)  TempSrc:    Oral  SpO2: 99% 99%    Weight:   60 kg   Height:   5\' 5"  (1.651 m)       Review of Systems:  Review of Systems  Constitutional: Positive for malaise/fatigue.  Gastrointestinal: Positive for abdominal pain, nausea and vomiting.  All other systems reviewed and are negative.    Past Medical History:  Diagnosis Date  . Collagen vascular disease ([place])   . Diabetes mellitus without complication ([place])   . Gastroparesis   . Lupus ([place])   . Pseudoseizures ([place])   . Seizures ([place])     Past Surgical History:  Procedure Laterality Date  . APPENDECTOMY    . CHOLECYSTECTOMY       reports that she has been smoking cigarettes. She has a 5.00 pack-year smoking history. She has never used smokeless tobacco. She reports current drug use. Drug: Marijuana. She reports that she does not drink alcohol.  Allergies  Allergen Reactions  . Dilantin [Phenytoin Sodium Extended] Hives  . Doxycycline Swelling  . Other   . Morphine And Related Rash  . Tylenol [Acetaminophen] Rash    Family History  Problem Relation Age of Onset  . Breast cancer Maternal Grandmother     Prior to Admission medications   Medication Sig Start Date End Date Taking? Authorizing Provider  hydrocodone-ibuprofen (VICOPROFEN) 5-200 MG tablet Take 1 tablet by mouth every 8 (eight) hours as needed for pain. 01/23/20   Nena Polio, MD  insulin aspart (NOVOLOG) 100 UNIT/ML injection  Inject 0-5 Units into the skin at bedtime. Patient taking differently: Inject 3 Units into the skin at bedtime.  03/07/17   Epifanio Lesches, MD  insulin NPH-regular Human (NOVOLIN 70/30) (70-30) 100 UNIT/ML injection Inject 22-25 Units into the skin 2 (two) times daily with a meal. 25 units in the morning and 22 units in the evening Patient not taking: Reported on 04/30/2017 03/07/17   Epifanio Lesches, MD  levETIRAcetam (KEPPRA) 750 MG tablet Take 1 tablet (750 mg total) by mouth 2 (two) times daily. Patient not taking: Reported on 06/09/2016 08/21/15   Paulette Blanch, MD  metoCLOPramide (REGLAN) 10 MG tablet Take 1 tablet (10 mg total) by mouth every 8 (eight) hours as needed. Patient not taking: Reported on 03/06/2017 02/25/17   Loney Hering, MD  ondansetron (ZOFRAN ODT) 4 MG disintegrating tablet Take 1 tablet (4 mg total) by mouth every 8 (eight) hours as needed for nausea or vomiting. Patient not taking: Reported on 06/13/2017 04/28/17   Rudene Re, MD  potassium chloride (K-DUR) 10 MEQ tablet Take 1 tablet (10 mEq total) by mouth daily for 4 days. 08/15/18 08/19/18  Lannie Fields, PA-C    Physical Exam: Vitals:   01/25/20 1700 01/25/20 1730 01/25/20 1800 01/25/20 1847  BP: 139/65 (!) 145/65  (!) 146/80  Pulse: Marland Kitchen)  117 (!) 110  (!) 110  Resp: (!) 22 (!) 22  (!) 24  Temp:    98.7 F (37.1 C)  TempSrc:    Oral  SpO2: 99% 99%    Weight:   60 kg   Height:   5\' 5"  (1.651 m)     Physical Exam Vitals and nursing note reviewed.  Constitutional:      General: She is not in acute distress.    Appearance: Normal appearance.  HENT:     Head: Normocephalic and atraumatic.     Nose: Nose normal.     Mouth/Throat:     Mouth: Mucous membranes are dry.  Eyes:     Extraocular Movements: Extraocular movements intact.     Pupils: Pupils are equal, round, and reactive to light.  Cardiovascular:     Rate and Rhythm: Regular rhythm. Tachycardia present.  Pulmonary:     Effort:  Pulmonary effort is normal.     Breath sounds: Normal breath sounds.  Abdominal:     General: Bowel sounds are normal. There is no distension.     Palpations: Abdomen is soft.     Tenderness: There is abdominal tenderness in the epigastric area.  Neurological:     General: No focal deficit present.     Mental Status: She is alert and oriented to person, place, and time.  Psychiatric:        Mood and Affect: Mood normal.        Behavior: Behavior normal.      Labs on Admission: I have personally reviewed following labs and imaging studies  CBC: Recent Labs  Lab 01/23/20 1854 01/24/20 2140 01/25/20 0851  WBC 8.2 8.9 18.9*  HGB 14.2 13.9 14.9  HCT 42.2 41.0 45.6  MCV 84.6 84.2 84.4  PLT 258 245 361   Basic Metabolic Panel: Recent Labs  Lab 01/23/20 1854 01/24/20 2140 01/25/20 0851 01/25/20 1332 01/25/20 1733  NA 137 134* 138  --  140  K 3.7 3.9 3.5  --  3.6  CL 98 97* 95*  --  104  CO2 23 22 16*  --  19*  GLUCOSE 382* 292* 416*  --  123*  BUN 12 10 19   --  15  CREATININE 0.59 0.64 0.89  --  0.61  CALCIUM 9.6 9.6 10.3  --  9.1  MG  --   --   --  1.8  --   PHOS  --   --   --  3.8  --    GFR: Estimated Creatinine Clearance: 85.8 mL/min (by C-G formula based on SCr of 0.61 mg/dL). Liver Function Tests: Recent Labs  Lab 01/23/20 1854 01/24/20 2140 01/25/20 0851  AST 12* 14* 16  ALT 10 10 13   ALKPHOS 56 55 61  BILITOT 1.2 1.3* 1.6*  PROT 8.1 8.3* 9.3*  ALBUMIN 4.5 4.4 5.0   Recent Labs  Lab 01/23/20 1854 01/24/20 2140 01/25/20 0851  LIPASE 52* 43 27   No results for input(s): AMMONIA in the last 168 hours. Coagulation Profile: No results for input(s): INR, PROTIME in the last 168 hours. Cardiac Enzymes: No results for input(s): CKTOTAL, CKMB, CKMBINDEX, TROPONINI in the last 168 hours. BNP (last 3 results) No results for input(s): PROBNP in the last 8760 hours. HbA1C: Recent Labs    01/25/20 1332  HGBA1C 11.8*   CBG: Recent Labs  Lab  01/25/20 0845 01/25/20 1437 01/25/20 1617 01/25/20 1814 01/25/20 1927  GLUCAP 377* 308* 138* 169* 177*  Lipid Profile: No results for input(s): CHOL, HDL, LDLCALC, TRIG, CHOLHDL, LDLDIRECT in the last 72 hours. Thyroid Function Tests: No results for input(s): TSH, T4TOTAL, FREET4, T3FREE, THYROIDAB in the last 72 hours. Anemia Panel: No results for input(s): VITAMINB12, FOLATE, FERRITIN, TIBC, IRON, RETICCTPCT in the last 72 hours. Urine analysis:    Component Value Date/Time   COLORURINE STRAW (A) 01/25/2020 0851   APPEARANCEUR HAZY (A) 01/25/2020 0851   APPEARANCEUR HAZY 06/15/2014 2100   LABSPEC 1.020 01/25/2020 0851   LABSPEC 1.018 06/15/2014 2100   PHURINE 5.0 01/25/2020 0851   GLUCOSEU >=500 (A) 01/25/2020 0851   GLUCOSEU >=500 mg/dL 06/15/2014 2100   HGBUR SMALL (A) 01/25/2020 0851   BILIRUBINUR NEGATIVE 01/25/2020 0851   BILIRUBINUR NEGATIVE 06/15/2014 2100   KETONESUR 80 (A) 01/25/2020 0851   PROTEINUR >=300 (A) 01/25/2020 0851   UROBILINOGEN 1.0 02/09/2008 1519   NITRITE NEGATIVE 01/25/2020 0851   LEUKOCYTESUR NEGATIVE 01/25/2020 0851   LEUKOCYTESUR TRACE 06/15/2014 2100    Intake/Output Summary (Last 24 hours) at 01/25/2020 2016 Last data filed at 01/25/2020 1823 Gross per 24 hour  Intake 3427.24 ml  Output --  Net 3427.24 ml   Lab Results  Component Value Date   CREATININE 0.61 01/25/2020   CREATININE 0.89 01/25/2020   CREATININE 0.64 01/24/2020    COVID-19 Labs  No results for input(s): DDIMER, FERRITIN, LDH, CRP in the last 72 hours.  Lab Results  Component Value Date   SARSCOV2NAA NEGATIVE 01/25/2020   [place] NEGATIVE 01/23/2020   SARSCOV2NAA NOT DETECTED 08/15/2018    Radiological Exams on Admission: CT ABDOMEN PELVIS W CONTRAST  Result Date: 01/23/2020 CLINICAL DATA:  Abdominal pain and back pain. EXAM: CT ABDOMEN AND PELVIS WITH CONTRAST TECHNIQUE: Multidetector CT imaging of the abdomen and pelvis was performed using the standard  protocol following bolus administration of intravenous contrast. CONTRAST:  130mL OMNIPAQUE IOHEXOL 300 MG/ML  SOLN COMPARISON:  February 22, 2017 FINDINGS: Lower chest: No acute abnormality. Hepatobiliary: No focal liver abnormality is seen. Status post cholecystectomy. No biliary dilatation. Pancreas: Unremarkable. No pancreatic ductal dilatation or surrounding inflammatory changes. Spleen: Normal in size without focal abnormality. Adrenals/Urinary Tract: The right adrenal gland is normal in size and appearance. A stable 1.2 cm low-attenuation left adrenal mass is seen. Kidneys are normal in size, without focal lesions. A 3 mm nonobstructing renal stone is seen within the mid left kidney. Bladder is unremarkable. Stomach/Bowel: Stomach is within normal limits. Appendix appears normal. No evidence of bowel wall thickening, distention, or inflammatory changes. Vascular/Lymphatic: There is moderate severity calcification of the abdominal aorta and bilateral common iliac arteries, without evidence of aneurysmal dilatation. No enlarged abdominal or pelvic lymph nodes. Reproductive: The uterus is unremarkable. A 1.1 cm x 1.2 cm left adnexal cyst is seen. Other: No abdominal wall hernia or abnormality. No abdominopelvic ascites. Musculoskeletal: Mild subcutaneous inflammatory fat stranding is seen adjacent to the posterior aspect of the coccyx, to the left of midline. No acute or significant osseous findings. IMPRESSION:  1. 3 mm nonobstructing renal stone within the left kidney.  2. Small low-attenuation left adrenal mass which likely represents an adrenal adenoma.  3. 1.1 cm x 1.2 cm left adnexal cyst, likely ovarian in origin.  4. Aortic atherosclerosis. Aortic Atherosclerosis (ICD10-I70.0).  Electronically Signed   By: Virgina Norfolk M.D.   On: 01/23/2020 21:43    EKG: Independently reviewed.  Sinus tach 129, RAE, LVH    Assessment/Plan DKA (diabetic ketoacidosis) (HCC)/N/V/ Abd Pain Assessment &  Plan  Pt presenting with Nausea.vomitting, abdominal pain for past 4 days with elevated AG of 27 and Elevated B-OH level indicative of DKA. Pt is started on Hyperglycemic/ DKA protocol. Admitted to progressive care unit with telemetry monitoring for any electrolytes abnormality related dysrhythmia. Glycemic protocol and a1c. Pt is a poorly controlled DM II with gastroparesis. Attribute to acidosis and lipase normal lactic pending.  SEPSIS Assessment & Plan Pt meets Sepsis criteria , lactic elevated at 2.4 I suspect will be elevated due to her seizures. Cultures collected we will follow and hydrate appropriately per  Hyperglycemia /DKA protocol. Continue IVF hydration.  Duodenitis Assessment & Plan Pt has h/o duodenitis and will need GI f/u and possible UGI eval for PUD. IV ppi.   Cannabis use disorder, moderate, dependence ([place]) Assessment & Plan Pt uses THC and is dependant .   Tobacco abuse disorder Assessment & Plan D/W pt about tobacco cessation and risk and also it contributing to her headaches most likely she has a neurologist for her seizures  And headaches.  Nicotine patch.  Elevated Troponin Assessment & Plan Attribute to either sepsis/ nstemi  We will cycle Cardiac enzymes / 2 d echo. ASA 81/ Lipitor 40MG  /Lovenox 1mg /kg q 12h Troponin: 220>>125>>581. Cardiology : [facility] who is unassigned, ih ave sent secure chat and placed a consult order.  DVT prophylaxis:  Heparin  Code Status:  Full COde   Family Communication:  Jamaryia watlington - [phone].  Disposition Plan:  Home   Consults called:  None  Admission status:  Status is: Inpatient  Remains inpatient appropriate because:Inpatient level of care appropriate due to severity of illness   Dispo: The patient is from: Home              Anticipated d/c is to: Home              Anticipated d/c date is: 2 days              Patient currently is not medically stable to d/c.   Para Skeans  MD [facility] Pager [phone] If 7PM-7AM, please contact night-coverage [email or web address] Password [email or web address] 01/25/2020, 8:16 PM

## 2020-01-25 NOTE — Assessment & Plan Note (Signed)
D/W pt about tobacco cessation and risk and also it contributing to her headaches most likely she has a neurologist for her seizures  And headaches.

## 2020-01-25 NOTE — Assessment & Plan Note (Signed)
Pt has h/o duodenitis and will need GI f/u and possible UGI eval for PUD. IV ppi.

## 2020-01-25 NOTE — Assessment & Plan Note (Signed)
Pt presenting with Nausea.vomitting, abdominal pain for past 4 days with elevated AG of 27 and Elevated B-OH level indicative of DKA. Pt is started on Hyperglycemic/ DKA protocol. Admitted to progressive care unit with telemetry monitoring for any electrolytes abnormality related dysrhythmia. Glycemic protocol and a1c. Pt is a poorly controlled DM II with gastroparesis.

## 2020-01-25 NOTE — Assessment & Plan Note (Signed)
Pt meets SIRS criteria , but lactic is pending, I suspect will be elevated due to her seizures. Cultures collected we will follow and hydrate appropriately per  Hyperglycemia /DKA protocol

## 2020-01-25 NOTE — ED Provider Notes (Signed)
[facility] Emergency Department Provider Note ____________________________________________   First MD Initiated Contact with Patient 01/25/20 [phone]     (approximate)  I have reviewed the triage vital signs and the nursing notes.  HISTORY  Chief Complaint Abdominal Pain   HPI Rachael Gray is a 39 y.o. femalewho presents to the ED for evaluation of abdominal pain.   Chart review indicates patient was seen 2 days ago on 10/31 for abdominal pain, shaking and tachycardia that have been present 3 days prior to that.  CT scan essentially normal, patient was rehydrated and discharged.  She was diagnosed with acute pancreatitis with a lipase of 50.   Patient reports she never drinks alcohol but is a regular marijuana smoker and has been for years.  She indicates that she has not been able to smoke for the past 2-3 weeks because she is been feeling poorly and vomiting.    Patient presents to the ED again for evaluation because "I cannot stop vomiting."  She indicates associated epigastric abdominal pain cramping with constant nausea and vomiting of countless episodes of nonbloody nonbilious emesis.  Denies diarrhea, lower abdominal pain, dysuria, vaginal discharge or bleeding, chest pain, cough, syncope.    Past Medical History:  Diagnosis Date  . Collagen vascular disease ([place])   . Diabetes mellitus without complication ([place])   . Gastroparesis   . Lupus ([place])   . Pseudoseizures ([place])   . Seizures [facility])     Patient Active Problem List   Diagnosis Date Noted  . Hypokalemia 12/24/2015  . Anemia 12/24/2015  . Diabetes ([place]) 12/24/2015  . Gastroparesis due to DM ([place])   . Acute respiratory alkalosis 12/22/2015  . Duodenitis 12/22/2015  . Vomiting 12/16/2015  . Intractable nausea and vomiting 12/10/2014  . Nausea & vomiting 12/10/2014  . Narcotic abuse ([place]) 12/09/2014  . Sepsis ([place]) 12/07/2014  . Anxiety   . Pyelonephritis 12/04/2014  . Lactic  acidosis 12/04/2014    Class: Acute    Past Surgical History:  Procedure Laterality Date  . APPENDECTOMY    . CHOLECYSTECTOMY      Prior to Admission medications   Medication Sig Start Date End Date Taking? Authorizing Provider  hydrocodone-ibuprofen (VICOPROFEN) 5-200 MG tablet Take 1 tablet by mouth every 8 (eight) hours as needed for pain. 01/23/20   Nena Polio, MD  insulin aspart (NOVOLOG) 100 UNIT/ML injection Inject 0-5 Units into the skin at bedtime. Patient taking differently: Inject 3 Units into the skin at bedtime.  03/07/17   Epifanio Lesches, MD  insulin NPH-regular Human (NOVOLIN 70/30) (70-30) 100 UNIT/ML injection Inject 22-25 Units into the skin 2 (two) times daily with a meal. 25 units in the morning and 22 units in the evening Patient not taking: Reported on 04/30/2017 03/07/17   Epifanio Lesches, MD  levETIRAcetam (KEPPRA) 750 MG tablet Take 1 tablet (750 mg total) by mouth 2 (two) times daily. Patient not taking: Reported on 06/09/2016 08/21/15   Paulette Blanch, MD  metoCLOPramide (REGLAN) 10 MG tablet Take 1 tablet (10 mg total) by mouth every 8 (eight) hours as needed. Patient not taking: Reported on 03/06/2017 02/25/17   Loney Hering, MD  ondansetron (ZOFRAN ODT) 4 MG disintegrating tablet Take 1 tablet (4 mg total) by mouth every 8 (eight) hours as needed for nausea or vomiting. Patient not taking: Reported on 06/13/2017 04/28/17   Rudene Re, MD  potassium chloride (K-DUR) 10 MEQ tablet Take 1 tablet (10 mEq total) by  mouth daily for 4 days. 08/15/18 08/19/18  Lannie Fields, PA-C    Allergies Dilantin [phenytoin sodium extended], Doxycycline, Other, Morphine and related, and Tylenol [acetaminophen]  Family History  Problem Relation Age of Onset  . Breast cancer Maternal Grandmother     Social History Social History   Tobacco Use  . Smoking status: Current Every Day Smoker    Packs/day: 0.50    Years: 10.00    Pack years: 5.00     Types: Cigarettes  . Smokeless tobacco: Never Used  Vaping Use  . Vaping Use: Never used  Substance Use Topics  . Alcohol use: No  . Drug use: Yes    Types: Marijuana    Comment: 3 months ago.     Review of Systems  Constitutional: No fever/chills Eyes: No visual changes. ENT: No sore throat. Cardiovascular: Denies chest pain. Respiratory: Denies shortness of breath. Gastrointestinal: Positive for abdominal pain, nausea and vomiting.  No diarrhea.  No constipation. Genitourinary: Negative for dysuria. Musculoskeletal: Negative for back pain. Skin: Negative for rash. Neurological: Negative for headaches, focal weakness or numbness.  ____________________________________________   PHYSICAL EXAM:  VITAL SIGNS: Vitals:   01/25/20 0958 01/25/20 1000  BP: (!) 141/87 130/82  Pulse: 95 83  Resp: 18 15  Temp: 97.8 F (36.6 C)   SpO2: 98% 98%     Constitutional: Alert and oriented.  Sitting up in bed actively heaving into an emesis bag that is mostly full with Eyes: Conjunctivae are normal. PERRL. EOMI. Head: Atraumatic. Nose: No congestion/rhinnorhea. Mouth/Throat: Mucous membranes are dry.  Oropharynx non-erythematous. Neck: No stridor. No cervical spine tenderness to palpation. Cardiovascular: Tachycardic rate, regular rhythm. Grossly normal heart sounds.  Good peripheral circulation. Respiratory: Normal respiratory effort.  No retractions. Lungs CTAB. Gastrointestinal: Soft , nondistended. No abdominal bruits. No CVA tenderness. Significant voluntary guarding throughout.  Difficult to assess for tenderness. Musculoskeletal: No lower extremity tenderness nor edema.  No joint effusions. No signs of acute trauma. Neurologic:  Normal speech and language. No gross focal neurologic deficits are appreciated. No gait instability noted. Skin:  Skin is warm, dry and intact. No rash noted. Psychiatric: Mood and affect are normal. Speech and behavior are  normal.  ____________________________________________   LABS (all labs ordered are listed, but only abnormal results are displayed)  Labs Reviewed  COMPREHENSIVE METABOLIC PANEL - Abnormal; Notable for the following components:      Result Value   Chloride 95 (*)    CO2 16 (*)    Glucose, Bld 416 (*)    Total Protein 9.3 (*)    Total Bilirubin 1.6 (*)    Anion gap 27 (*)    All other components within normal limits  CBC - Abnormal; Notable for the following components:   WBC 18.9 (*)    RBC 5.40 (*)    All other components within normal limits  URINALYSIS, COMPLETE (UACMP) WITH MICROSCOPIC - Abnormal; Notable for the following components:   Color, Urine STRAW (*)    APPearance HAZY (*)    Glucose, UA >=500 (*)    Hgb urine dipstick SMALL (*)    Ketones, ur 80 (*)    Protein, ur >=300 (*)    All other components within normal limits  BETA-HYDROXYBUTYRIC ACID - Abnormal; Notable for the following components:   Beta-Hydroxybutyric Acid 8.06 (*)    All other components within normal limits  URINE DRUG SCREEN, QUALITATIVE ([facility] ONLY) - Abnormal; Notable for the following components:   Cannabinoid 50 Ng,  Ur  POSITIVE (*)    All other components within normal limits  CBG MONITORING, ED - Abnormal; Notable for the following components:   Glucose-Capillary 377 (*)    All other components within normal limits  RESPIRATORY PANEL BY RT PCR (FLU A&B, COVID)  URINE CULTURE  LIPASE, BLOOD  HCG, QUANTITATIVE, PREGNANCY  BLOOD GAS, VENOUS  POC URINE PREG, ED   ____________________________________________  12 Lead EKG  Sinus rhythm, rate of 129 bpm.  Normal axis and intervals.  No evidence of acute ischemia.  Sinus tachycardia. ____________________________________________  RADIOLOGY  ED MD interpretation:    Official radiology report(s): No results found.  ____________________________________________   PROCEDURES and INTERVENTIONS  Procedure(s) performed (including  Critical Care):  .1-3 Lead EKG Interpretation Performed by: Vladimir Crofts, MD Authorized by: Vladimir Crofts, MD     Interpretation: abnormal     ECG rate:  120   ECG rate assessment: normal     Rhythm: sinus tachycardia     Ectopy: none     Conduction: normal   .Critical Care Performed by: Vladimir Crofts, MD Authorized by: Vladimir Crofts, MD   Critical care provider statement:    Critical care time (minutes):  35   Critical care was necessary to treat or prevent imminent or life-threatening deterioration of the following conditions:  Metabolic crisis, dehydration and endocrine crisis   Critical care was time spent personally by me on the following activities:  Discussions with consultants, evaluation of patient's response to treatment, examination of patient, ordering and performing treatments and interventions, ordering and review of laboratory studies, ordering and review of radiographic studies, pulse oximetry, re-evaluation of patient's condition, obtaining history from patient or surrogate and review of old charts    Medications  insulin regular, human (MYXREDLIN) 100 units/ 100 mL infusion (has no administration in time range)  lactated ringers infusion (has no administration in time range)  dextrose 5 % in lactated ringers infusion (has no administration in time range)  dextrose 50 % solution 0-50 mL (has no administration in time range)  potassium chloride 10 mEq in 100 mL IVPB (has no administration in time range)  ondansetron (ZOFRAN-ODT) disintegrating tablet 4 mg (4 mg Oral Given 01/25/20 0853)  lactated ringers bolus 1,000 mL (1,000 mLs Intravenous New Bag/Given 01/25/20 1021)  lactated ringers bolus 1,000 mL (1,000 mLs Intravenous New Bag/Given 01/25/20 0957)  fentaNYL (SUBLIMAZE) injection 50 mcg (50 mcg Intravenous Given 01/25/20 0957)  droperidol (INAPSINE) 2.5 MG/ML injection 2.5 mg (2.5 mg Intravenous Given 01/25/20 1018)     ____________________________________________   MDM / ED COURSE  39 year old female with history of type 2 diabetes presents to the ED with recurrent vomiting, most consistent with DKA, and requiring medical admission with insulin drip.  Patient presented in sinus tachycardia, but remains hemodynamically stable and otherwise normal vital signs on room air.  Exam demonstrates an uncomfortable atropine patient to has frequent emesis and voluntary guarding without significant abdominal tenderness.  She does have stigmata of dehydration.  Blood work shows further signs of dehydration with evidence of DKA due to elevated anion gap and beta hydroxybutyrate.  While my initial concern was cannabis hyperemesis syndrome, her metabolic derangements are more severe than anticipated with evidence of DKA.  We will continue to rehydrate her, replete electrolytes, initiate insulin drip and admit to hospitalist medicine for further work-up and management.  Clinical Course as of Jan 25 1215  Tue Jan 25, 2020  1156 Educated patient and daughter on diagnosis of DKA.  We  discussed need for hospitalization and insulin drip.   [DS]    Clinical Course User Index [DS] Vladimir Crofts, MD     ____________________________________________   FINAL CLINICAL IMPRESSION(S) / ED DIAGNOSES  Final diagnoses:  Dehydration  Diabetic ketoacidosis without coma associated with type 2 diabetes mellitus [facility])     ED Discharge Orders    None       Alaena Strader   Note:  This document was prepared using Dragon voice recognition software and may include unintentional dictation errors.   Vladimir Crofts, MD 01/25/20 1218

## 2020-01-26 DIAGNOSIS — K2980 Duodenitis without bleeding: Secondary | ICD-10-CM

## 2020-01-26 DIAGNOSIS — R6510 Systemic inflammatory response syndrome (SIRS) of non-infectious origin without acute organ dysfunction: Secondary | ICD-10-CM | POA: Diagnosis not present

## 2020-01-26 DIAGNOSIS — E1110 Type 2 diabetes mellitus with ketoacidosis without coma: Principal | ICD-10-CM

## 2020-01-26 DIAGNOSIS — R112 Nausea with vomiting, unspecified: Secondary | ICD-10-CM

## 2020-01-26 LAB — CBC
HCT: 34.4 % — ABNORMAL LOW (ref 36.0–46.0)
Hemoglobin: 11.8 g/dL — ABNORMAL LOW (ref 12.0–15.0)
MCH: 28.4 pg (ref 26.0–34.0)
MCHC: 34.3 g/dL (ref 30.0–36.0)
MCV: 82.7 fL (ref 80.0–100.0)
Platelets: 231 10*3/uL (ref 150–400)
RBC: 4.16 MIL/uL (ref 3.87–5.11)
RDW: 12.8 % (ref 11.5–15.5)
WBC: 15.3 10*3/uL — ABNORMAL HIGH (ref 4.0–10.5)
nRBC: 0 % (ref 0.0–0.2)

## 2020-01-26 LAB — BASIC METABOLIC PANEL
Anion gap: 15 (ref 5–15)
Anion gap: 14 (ref 5–15)
BUN: 9 mg/dL (ref 6–20)
BUN: 9 mg/dL (ref 6–20)
CO2: 22 mmol/L (ref 22–32)
CO2: 22 mmol/L (ref 22–32)
Calcium: 9.6 mg/dL (ref 8.9–10.3)
Calcium: 9.2 mg/dL (ref 8.9–10.3)
Chloride: 101 mmol/L (ref 98–111)
Chloride: 102 mmol/L (ref 98–111)
Creatinine, Ser: 0.66 mg/dL (ref 0.44–1.00)
Creatinine, Ser: 0.63 mg/dL (ref 0.44–1.00)
GFR, Estimated: >60 mL/min (ref >60)
GFR, Estimated: >60 mL/min (ref >60)
Glucose, Bld: 114 mg/dL — ABNORMAL HIGH (ref 70–99)
Glucose, Bld: 144 mg/dL — ABNORMAL HIGH (ref 70–99)
Potassium: 3.2 mmol/L — ABNORMAL LOW (ref 3.5–5.1)
Potassium: 3.1 mmol/L — ABNORMAL LOW (ref 3.5–5.1)
Sodium: 138 mmol/L (ref 135–145)
Sodium: 138 mmol/L (ref 135–145)

## 2020-01-26 LAB — ECHOCARDIOGRAM COMPLETE
Area-P 1/2: 4.21 cm2
Height: 65 in
S' Lateral: 2.55 cm
Weight: 2116.42 oz

## 2020-01-26 LAB — GLUCOSE, CAPILLARY
Glucose-Capillary: 78 mg/dL (ref 70–99)
Glucose-Capillary: 79 mg/dL (ref 70–99)
Glucose-Capillary: 101 mg/dL — ABNORMAL HIGH (ref 70–99)
Glucose-Capillary: 98 mg/dL (ref 70–99)
Glucose-Capillary: 138 mg/dL — ABNORMAL HIGH (ref 70–99)
Glucose-Capillary: 198 mg/dL — ABNORMAL HIGH (ref 70–99)
Glucose-Capillary: 173 mg/dL — ABNORMAL HIGH (ref 70–99)
Glucose-Capillary: 152 mg/dL — ABNORMAL HIGH (ref 70–99)
Glucose-Capillary: 163 mg/dL — ABNORMAL HIGH (ref 70–99)
Glucose-Capillary: 139 mg/dL — ABNORMAL HIGH (ref 70–99)
Glucose-Capillary: 257 mg/dL — ABNORMAL HIGH (ref 70–99)
Glucose-Capillary: 154 mg/dL — ABNORMAL HIGH (ref 70–99)
Glucose-Capillary: 110 mg/dL — ABNORMAL HIGH (ref 70–99)

## 2020-01-26 LAB — TROPONIN I (HIGH SENSITIVITY)
Troponin I (High Sensitivity): 2922 ng/L (ref <18)
Troponin I (High Sensitivity): 2358 ng/L (ref <18)

## 2020-01-26 LAB — LACTIC ACID, PLASMA
Lactic Acid, Venous: 1.2 mmol/L (ref 0.5–1.9)
Lactic Acid, Venous: 0.9 mmol/L (ref 0.5–1.9)

## 2020-01-26 LAB — LIPASE, BLOOD: Lipase: 28 U/L (ref 11–51)

## 2020-01-26 LAB — URINE CULTURE

## 2020-01-26 LAB — PROCALCITONIN: Procalcitonin: <0.1 ng/mL

## 2020-01-26 LAB — FIBRIN DERIVATIVES D-DIMER (ARMC ONLY): Fibrin derivatives D-dimer (ARMC): 427.35 ng/mL (FEU) (ref 0.00–499.00)

## 2020-01-26 MED ORDER — INSULIN GLARGINE 100 UNIT/ML ~~LOC~~ SOLN
6.0000 [IU] | Freq: Every day | SUBCUTANEOUS | Status: DC
  Administered 2020-01-26: 6 [IU] via SUBCUTANEOUS
  Filled 2020-01-26 (×2): qty 0.06

## 2020-01-26 MED ORDER — KETOROLAC TROMETHAMINE 30 MG/ML IJ SOLN: 30.0000 mg | Freq: Once | INTRAMUSCULAR | Status: AC

## 2020-01-26 MED ORDER — KETOROLAC TROMETHAMINE 30 MG/ML IJ SOLN
INTRAMUSCULAR | Status: AC
  Administered 2020-01-26: 30 mg via INTRAVENOUS
  Filled 2020-01-26: qty 1

## 2020-01-26 MED ORDER — KETOROLAC TROMETHAMINE 30 MG/ML IJ SOLN
30.0000 mg | Freq: Four times a day (QID) | INTRAMUSCULAR | Status: DC | PRN
  Administered 2020-01-26 – 2020-01-28 (×5): 30 mg via INTRAVENOUS
  Filled 2020-01-26 (×5): qty 1

## 2020-01-26 MED ORDER — METOCLOPRAMIDE HCL 5 MG/ML IJ SOLN
10.0000 mg | Freq: Three times a day (TID) | INTRAMUSCULAR | Status: DC
  Administered 2020-01-26 – 2020-01-27 (×2): 10 mg via INTRAVENOUS
  Filled 2020-01-26 (×2): qty 2

## 2020-01-26 MED ORDER — METOCLOPRAMIDE HCL 5 MG PO TABS
10.0000 mg | ORAL_TABLET | Freq: Three times a day (TID) | ORAL | Status: DC
  Administered 2020-01-27 – 2020-01-28 (×4): 10 mg via ORAL
  Filled 2020-01-26 (×2): qty 2
  Filled 2020-01-26: qty 1
  Filled 2020-01-26 (×2): qty 2

## 2020-01-26 MED ORDER — POTASSIUM CHLORIDE 20 MEQ PO PACK
40.0000 meq | PACK | ORAL | Status: AC
  Administered 2020-01-26 (×2): 40 meq via ORAL
  Filled 2020-01-26 (×2): qty 2

## 2020-01-26 MED ORDER — SODIUM CHLORIDE 0.9 % IV SOLN
750.0000 mg | Freq: Two times a day (BID) | INTRAVENOUS | Status: DC
  Administered 2020-01-26: 750 mg via INTRAVENOUS
  Filled 2020-01-26 (×4): qty 7.5

## 2020-01-26 MED ORDER — BISACODYL 5 MG PO TBEC: 5.0000 mg | DELAYED_RELEASE_TABLET | Freq: Every day | ORAL | Status: DC | PRN

## 2020-01-26 MED ORDER — SIMETHICONE 80 MG PO CHEW
80.0000 mg | CHEWABLE_TABLET | Freq: Four times a day (QID) | ORAL | Status: DC | PRN
  Administered 2020-01-26: 80 mg via ORAL
  Filled 2020-01-26 (×2): qty 1

## 2020-01-26 MED ORDER — LEVETIRACETAM 750 MG PO TABS
750.0000 mg | ORAL_TABLET | Freq: Two times a day (BID) | ORAL | Status: DC
  Administered 2020-01-26 – 2020-01-28 (×4): 750 mg via ORAL
  Filled 2020-01-26 (×6): qty 1

## 2020-01-26 MED ORDER — METOCLOPRAMIDE HCL 10 MG PO TABS
10.0000 mg | ORAL_TABLET | Freq: Three times a day (TID) | ORAL | Status: DC | PRN
  Filled 2020-01-26: qty 1

## 2020-01-26 MED ORDER — HYDROCODONE-ACETAMINOPHEN 7.5-325 MG PO TABS
1.0000 | ORAL_TABLET | Freq: Four times a day (QID) | ORAL | Status: DC | PRN
  Administered 2020-01-26: 1 via ORAL
  Filled 2020-01-26: qty 1

## 2020-01-26 MED ORDER — INSULIN GLARGINE 100 UNIT/ML ~~LOC~~ SOLN
15.0000 [IU] | Freq: Every day | SUBCUTANEOUS | Status: DC
  Filled 2020-01-26: qty 0.15

## 2020-01-26 MED ORDER — METOCLOPRAMIDE HCL 5 MG/ML IJ SOLN
10.0000 mg | Freq: Three times a day (TID) | INTRAMUSCULAR | Status: DC | PRN
  Administered 2020-01-26: 10 mg via INTRAVENOUS
  Filled 2020-01-26: qty 2

## 2020-01-26 MED ORDER — POLYETHYLENE GLYCOL 3350 17 G PO PACK
17.0000 g | PACK | Freq: Every day | ORAL | Status: DC
  Administered 2020-01-27 – 2020-01-28 (×2): 17 g via ORAL
  Filled 2020-01-26 (×2): qty 1

## 2020-01-26 MED ORDER — INSULIN ASPART 100 UNIT/ML ~~LOC~~ SOLN
0.0000 [IU] | SUBCUTANEOUS | Status: DC
  Administered 2020-01-26: 17:00:00 5 [IU] via SUBCUTANEOUS
  Administered 2020-01-27: 3 [IU] via SUBCUTANEOUS
  Filled 2020-01-26 (×2): qty 1

## 2020-01-26 MED ORDER — CHLORHEXIDINE GLUCONATE CLOTH 2 % EX PADS
6.0000 | MEDICATED_PAD | Freq: Every day | CUTANEOUS | Status: DC
  Administered 2020-01-27 – 2020-01-28 (×2): 6 via TOPICAL

## 2020-01-26 NOTE — Consult Note (Signed)
[facility] Consultation Note  Patient ID: Rachael Gray, MRN: [id number], DOB/AGE: 38-27-1982 38 y.o. Admit date: 01/25/2020   Date of Consult: 01/26/2020 Primary Physician: Patient, No Pcp Per Primary Cardiologist: None  Chief Complaint:  Chief Complaint  Patient presents with  . Abdominal Pain   Reason for Consult: Elevated troponin with sepsis and sinus tachycardia  HPI: 39 y.o. female with known diabetes and seizures with significant new concerns of weakness fatigue and sepsis.  The patient was seen in the emergency room with disorientation weakness fatigue and issues with consciousness.  At that time it was unclear whether she had seizures or sepsis.  White blood cell count was 18.9 with no apparent primary source of infection although was in DKA as well.  Blood pressure heart rate were abnormal suggesting DKA and sepsis as well.  The patient has had improvements overnight with no current evidence of chest pain or congestive heart failure or concerns for elevated troponin for acute coronary syndrome.  The patient has had an EKG showing sinus tachycardia otherwise normal EKG.  Patient troponin was 220, 125, 581.  Most of this is consistent with demand ischemia and sepsis.  Echocardiogram has shown normal LV systolic function with ejection fraction of 60% and no evidence of of valvular heart disease.  This morning the patient is alert and awake but not following commands well.  There is been no evidence of symptoms or signs of congestive heart failure or cardiac issue this a.m.  Past Medical History:  Diagnosis Date  . Collagen vascular disease ([place])   . Diabetes mellitus without complication ([place])   . Gastroparesis   . Lupus ([place])   . Pseudoseizures ([place])   . Seizures [facility])       Surgical History:  Past Surgical History:  Procedure Laterality Date  . APPENDECTOMY    . CHOLECYSTECTOMY       Home Meds: Prior to Admission medications   Medication Sig Start Date  End Date Taking? Authorizing Provider  hydrocodone-ibuprofen (VICOPROFEN) 5-200 MG tablet Take 1 tablet by mouth every 8 (eight) hours as needed for pain. Patient not taking: Reported on 01/25/2020 01/23/20   Nena Polio, MD  insulin aspart (NOVOLOG) 100 UNIT/ML injection Inject 0-5 Units into the skin at bedtime. Patient not taking: Reported on 01/25/2020 03/07/17   Epifanio Lesches, MD  insulin NPH-regular Human (NOVOLIN 70/30) (70-30) 100 UNIT/ML injection Inject 22-25 Units into the skin 2 (two) times daily with a meal. 25 units in the morning and 22 units in the evening Patient not taking: Reported on 04/30/2017 03/07/17   Epifanio Lesches, MD  levETIRAcetam (KEPPRA) 750 MG tablet Take 1 tablet (750 mg total) by mouth 2 (two) times daily. Patient not taking: Reported on 06/09/2016 08/21/15   Paulette Blanch, MD  metoCLOPramide (REGLAN) 10 MG tablet Take 1 tablet (10 mg total) by mouth every 8 (eight) hours as needed. Patient not taking: Reported on 03/06/2017 02/25/17   Loney Hering, MD  ondansetron (ZOFRAN ODT) 4 MG disintegrating tablet Take 1 tablet (4 mg total) by mouth every 8 (eight) hours as needed for nausea or vomiting. Patient not taking: Reported on 06/13/2017 04/28/17   Rudene Re, MD  potassium chloride (K-DUR) 10 MEQ tablet Take 1 tablet (10 mEq total) by mouth daily for 4 days. Patient not taking: Reported on 01/25/2020 08/15/18 08/19/18  Lannie Fields, PA-C    Inpatient Medications:  . aspirin EC  81 mg Oral Daily  . atorvastatin  40 mg Oral QHS  . Chlorhexidine Gluconate Cloth  6 each Topical Daily  . heparin injection (subcutaneous)  5,000 Units Subcutaneous Q8H  . levETIRAcetam  750 mg Oral BID  . nicotine  21 mg Transdermal Daily  . pantoprazole (PROTONIX) IV  40 mg Intravenous Q12H  . thiamine  100 mg Oral Daily   . sodium chloride Stopped (01/25/20 1823)  . dextrose 5% lactated ringers 125 mL/hr at 01/26/20 0239  . insulin 2.8 Units/hr (01/25/20  2150)  . lactated ringers Stopped (01/25/20 1824)    Allergies:  Allergies  Allergen Reactions  . Dilantin [Phenytoin Sodium Extended] Hives  . Doxycycline Swelling  . Other   . Morphine And Related Rash  . Tylenol [Acetaminophen] Rash    Social History   Socioeconomic History  . Marital status: Single    Spouse name: Not on file  . Number of children: 2  . Years of education: Not on file  . Highest education level: Not on file  Occupational History  . Not on file  Tobacco Use  . Smoking status: Current Every Day Smoker    Packs/day: 0.50    Years: 10.00    Pack years: 5.00    Types: Cigarettes  . Smokeless tobacco: Never Used  Vaping Use  . Vaping Use: Never used  Substance and Sexual Activity  . Alcohol use: No  . Drug use: Yes    Types: Marijuana    Comment: 3 months ago.   Marland Kitchen Sexual activity: Not Currently    Birth control/protection: None  Other Topics Concern  . Not on file  Social History Narrative   Lives with her mother and two children   Social Determinants of Health   Financial Resource Strain:   . Difficulty of Paying Living Expenses: Not on file  Food Insecurity:   . Worried About Charity fundraiser in the Last Year: Not on file  . Ran Out of Food in the Last Year: Not on file  Transportation Needs:   . Lack of Transportation (Medical): Not on file  . Lack of Transportation (Non-Medical): Not on file  Physical Activity:   . Days of Exercise per Week: Not on file  . Minutes of Exercise per Session: Not on file  Stress:   . Feeling of Stress : Not on file  Social Connections:   . Frequency of Communication with Friends and Family: Not on file  . Frequency of Social Gatherings with Friends and Family: Not on file  . Attends Religious Services: Not on file  . Active Member of Clubs or Organizations: Not on file  . Attends Archivist Meetings: Not on file  . Marital Status: Not on file  Intimate Partner Violence:   . Fear of  Current or Ex-Partner: Not on file  . Emotionally Abused: Not on file  . Physically Abused: Not on file  . Sexually Abused: Not on file     Family History  Problem Relation Age of Onset  . Breast cancer Maternal Grandmother      Review of Systems Positive for weakness fatigue Negative for: General:  chills, fever, night sweats or weight changes.  Cardiovascular: PND orthopnea syncope dizziness  Dermatological skin lesions rashes Respiratory: Cough congestion Urologic: Frequent urination urination at night and hematuria Abdominal: negative for nausea, vomiting, diarrhea, bright red blood per rectum, melena, or hematemesis Neurologic: negative for visual changes, and/or hearing changes  All other systems reviewed and are otherwise negative except as noted above.  Labs: No results for input(s): CKTOTAL, CKMB, TROPONINI in the last 72 hours. Lab Results  Component Value Date   WBC 18.9 (H) 01/25/2020   HGB 14.9 01/25/2020   HCT 45.6 01/25/2020   MCV 84.4 01/25/2020   PLT 303 01/25/2020    Recent Labs  Lab 01/25/20 0851 01/25/20 0851 01/25/20 1733  NA 138   < > 140  K 3.5   < > 3.6  CL 95*   < > 104  CO2 16*   < > 19*  BUN 19   < > 15  CREATININE 0.89   < > 0.61  CALCIUM 10.3   < > 9.1  PROT 9.3*  --   --   BILITOT 1.6*  --   --   ALKPHOS 61  --   --   ALT 13  --   --   AST 16  --   --   GLUCOSE 416*   < > 123*   < > = values in this interval not displayed.   No results found for: CHOL, HDL, LDLCALC, TRIG No results found for: DDIMER  Radiology/Studies:  CT ABDOMEN PELVIS W CONTRAST  Result Date: 01/23/2020 CLINICAL DATA:  Abdominal pain and back pain. EXAM: CT ABDOMEN AND PELVIS WITH CONTRAST TECHNIQUE: Multidetector CT imaging of the abdomen and pelvis was performed using the standard protocol following bolus administration of intravenous contrast. CONTRAST:  113mL OMNIPAQUE IOHEXOL 300 MG/ML  SOLN COMPARISON:  February 22, 2017 FINDINGS: Lower chest: No acute  abnormality. Hepatobiliary: No focal liver abnormality is seen. Status post cholecystectomy. No biliary dilatation. Pancreas: Unremarkable. No pancreatic ductal dilatation or surrounding inflammatory changes. Spleen: Normal in size without focal abnormality. Adrenals/Urinary Tract: The right adrenal gland is normal in size and appearance. A stable 1.2 cm low-attenuation left adrenal mass is seen. Kidneys are normal in size, without focal lesions. A 3 mm nonobstructing renal stone is seen within the mid left kidney. Bladder is unremarkable. Stomach/Bowel: Stomach is within normal limits. Appendix appears normal. No evidence of bowel wall thickening, distention, or inflammatory changes. Vascular/Lymphatic: There is moderate severity calcification of the abdominal aorta and bilateral common iliac arteries, without evidence of aneurysmal dilatation. No enlarged abdominal or pelvic lymph nodes. Reproductive: The uterus is unremarkable. A 1.1 cm x 1.2 cm left adnexal cyst is seen. Other: No abdominal wall hernia or abnormality. No abdominopelvic ascites. Musculoskeletal: Mild subcutaneous inflammatory fat stranding is seen adjacent to the posterior aspect of the coccyx, to the left of midline. No acute or significant osseous findings. IMPRESSION: 1. 3 mm nonobstructing renal stone within the left kidney. 2. Small low-attenuation left adrenal mass which likely represents an adrenal adenoma. 3. 1.1 cm x 1.2 cm left adnexal cyst, likely ovarian in origin. 4. Aortic atherosclerosis. Aortic Atherosclerosis (ICD10-I70.0). Electronically Signed   By: Virgina Norfolk M.D.   On: 01/23/2020 21:43   ECHOCARDIOGRAM COMPLETE  Result Date: 01/26/2020    ECHOCARDIOGRAM REPORT   Patient Name:   KEYLEE SHRESTHA Date of Exam: 01/25/2020 Medical Rec #:  [id number]          Height:       65.0 in Accession #:    [id number]         Weight:       132.3 lb Date of Birth:  02-16-1981          BSA:          1.659 m Patient Age:    57 years  BP:           123/63 mmHg Patient Gender: F                  HR:           112 bpm. Exam Location:  [facility] Procedure: 2D Echo, Cardiac Doppler and Color Doppler Indications:     Elevated Troponin  History:         Patient has no prior history of Echocardiogram examinations.                  Risk Factors:Diabetes. Lupus. Seizures.  Sonographer:     Wilford Sports Rodgers-Jones Referring Phys:  [place] Diagnosing Phys: Serafina Royals MD IMPRESSIONS  1. Left ventricular ejection fraction, by estimation, is 60 to 65%. The left ventricle has normal function. The left ventricle has no regional wall motion abnormalities. Left ventricular diastolic parameters were normal.  2. Right ventricular systolic function is normal. The right ventricular size is normal.  3. The mitral valve is normal in structure. Trivial mitral valve regurgitation.  4. The aortic valve is normal in structure. Aortic valve regurgitation is not visualized. FINDINGS  Left Ventricle: Left ventricular ejection fraction, by estimation, is 60 to 65%. The left ventricle has normal function. The left ventricle has no regional wall motion abnormalities. The left ventricular internal cavity size was normal in size. There is  no left ventricular hypertrophy. Left ventricular diastolic parameters were normal. Right Ventricle: The right ventricular size is normal. No increase in right ventricular wall thickness. Right ventricular systolic function is normal. Left Atrium: Left atrial size was normal in size. Right Atrium: Right atrial size was normal in size. Pericardium: There is no evidence of pericardial effusion. Mitral Valve: The mitral valve is normal in structure. Trivial mitral valve regurgitation. Tricuspid Valve: The tricuspid valve is normal in structure. Tricuspid valve regurgitation is trivial. Aortic Valve: The aortic valve is normal in structure. Aortic valve regurgitation is not visualized. Pulmonic Valve: The pulmonic valve was normal  in structure. Pulmonic valve regurgitation is not visualized. Aorta: The aortic root and ascending aorta are structurally normal, with no evidence of dilitation. IAS/Shunts: No atrial level shunt detected by color flow Doppler.  LEFT VENTRICLE PLAX 2D LVIDd:         4.39 cm  Diastology LVIDs:         2.55 cm  LV e' medial:    9.68 cm/s LV PW:         0.83 cm  LV E/e' medial:  10.5 LV IVS:        0.70 cm  LV e' lateral:   13.10 cm/s LVOT diam:     1.90 cm  LV E/e' lateral: 7.8 LV SV:         62 LV SV Index:   37 LVOT Area:     2.84 cm  RIGHT VENTRICLE             IVC RV Basal diam:  3.01 cm     IVC diam: 1.54 cm RV S prime:     22.70 cm/s TAPSE (M-mode): 2.0 cm LEFT ATRIUM             Index       RIGHT ATRIUM          Index LA diam:        3.50 cm 2.11 cm/m  RA Area:     9.90 cm LA Vol (A2C):   39.5 ml  23.80 ml/m RA Volume:   22.10 ml 13.32 ml/m LA Vol (A4C):   28.8 ml 17.36 ml/m LA Biplane Vol: 36.2 ml 21.82 ml/m  AORTIC VALVE LVOT Vmax:   121.00 cm/s LVOT Vmean:  94.100 cm/s LVOT VTI:    0.218 m  AORTA Ao Root diam: 2.90 cm Ao Asc diam:  2.90 cm MITRAL VALVE MV Area (PHT): 4.21 cm     SHUNTS MV Decel Time: 180 msec     Systemic VTI:  0.22 m MV E velocity: 102.00 cm/s  Systemic Diam: 1.90 cm MV A velocity: 119.00 cm/s MV E/A ratio:  0.86 Serafina Royals MD Electronically signed by Serafina Royals MD Signature Date/Time: 01/26/2020/7:13:35 AM    Final     EKG: Sinus tachycardia otherwise normal EKG  Weights: Filed Weights   01/25/20 0843 01/25/20 1800  Weight: 61.2 kg 60 kg     Physical Exam: Blood pressure (!) 146/80, pulse (!) 110, temperature 98.7 F (37.1 C), temperature source Oral, resp. rate (!) 24, height 5\' 5"  (1.651 m), weight 60 kg, SpO2 99 %. Body mass index is 22.01 kg/m. General: Well developed, well nourished, in no acute distress. Head eyes ears nose throat: Normocephalic, atraumatic, sclera non-icteric, no xanthomas, nares are without discharge. No apparent thyromegaly and/or  mass  Lungs: Normal respiratory effort.  Few wheezes, no rales, no rhonchi.  Heart: RRR with normal S1 S2. no murmur gallop, no rub, PMI is normal size and placement, carotid upstroke normal without bruit, jugular venous pressure is normal Abdomen: Soft, non-tender, non-distended with normoactive bowel sounds. No hepatomegaly. No rebound/guarding. No obvious abdominal masses. Abdominal aorta is normal size without bruit Extremities: No edema. no cyanosis, no clubbing, no ulcers  Peripheral : 2+ bilateral upper extremity pulses, 2+ bilateral femoral pulses, 2+ bilateral dorsal pedal pulse Neuro: Alert and not oriented. No facial asymmetry. No focal deficit. Moves all extremities spontaneously. Musculoskeletal: Normal muscle tone without kyphosis Psych:  Responds to questions appropriately with a normal affect.  But still somewhat delirious    Assessment: 39 year old female with diabetes and seizures with presentation of possible DKA and sepsis with unknown infection but has had signs and symptoms of this and no current evidence of acute coronary syndrome congestive heart failure or true angina at this time with a normal LV systolic function, normal EKG, and troponins consistent with demand ischemia  Plan: 1.  Continue supportive care for DKA sepsis as per critical care and/or medicine 2.  No further cardiac intervention of elevated troponins most consistent with demand ischemia and no current evidence of congestive heart failure acute coronary syndrome at this time 3.  Would consider reassessment of troponin fourth cycle to assess any other trends 4.  No further cardiac diagnostics at this time due to normal LV systolic function and no evidence of congestive heart failure 5.  Further treatment options after above  Signed, Corey Skains M.D. [facility] 01/26/2020, 7:23 AM

## 2020-01-26 NOTE — Progress Notes (Addendum)
Inpatient Diabetes Program Recommendations  AACE/ADA: New Consensus Statement on Inpatient Glycemic Control   Target Ranges:  Prepandial:   less than 140 mg/dL      Peak postprandial:   less than 180 mg/dL (1-2 hours)      Critically ill patients:  140 - 180 mg/dL   Results for Rachael Gray, Rachael Gray (MRN [id number]) as of 01/26/2020 08:03  Ref. Range 01/26/2020 01:47 01/26/2020 02:48 01/26/2020 03:46 01/26/2020 05:18 01/26/2020 06:25 01/26/2020 07:28  Glucose-Capillary Latest Ref Range: 70 - 99 mg/dL 101 (H) 98 138 (H) 198 (H) 173 (H) 152 (H)  Results for Rachael Gray, Rachael Gray (MRN [id number]) as of 01/26/2020 08:03  Ref. Range 01/25/2020 08:51  Beta-Hydroxybutyric Acid Latest Ref Range: 0.05 - 0.27 mmol/L >8.00 (H)  Glucose Latest Ref Range: 70 - 99 mg/dL 416 (H)  Results for Rachael Gray, Rachael Gray (MRN [id number]) as of 01/26/2020 08:03  Ref. Range 01/25/2020 13:32  Hemoglobin A1C Latest Ref Range: 4.8 - 5.6 % 11.8 (H)   Review of Glycemic Control  Outpatient Diabetes medications: 70/30 25 units QAM, 70/30 15 units QPM (Not taken any 70/30 in over a year) Current orders for Inpatient glycemic control: IV insulin per DKA  Inpatient Diabetes Program Recommendations:    Labs: Please order stat BMET to assess electrolytes and acidosis.   Insulin: Once acidosis is completely cleared and MD is ready to transition from IV to SQ insulin, please consider ordering Lantus 15 units Q24H, CBGs Q4H, Novolog 0-9 units Q4H, and if diet ordered please order Novolog 3 units TID with meals for meal coverage if patient eats at least 50% of meals.  HbgA1C:  A1C 11.8% on 01/25/20 indicating an average glucose of 292 mg/dl over the past 2-3 months. At time of discharge, please provide Rx for: 70/30 insulin vials and insulin syringes.  Addendum 01/26/20@14 :10-Spoke with patient about diabetes and home regimen for diabetes control. Patient sitting up in bed rocking herself in circle direction. When inquired about how she was  doing, patient was tearful and states she is in severe abdominal pain.  Patient states that she has communicated pain to nursing and she has been told that the pain was due to gas but patient states she does not think it is gas.  Patient reports that she does not have a PCP and that she has not taken any DM medications in over a year or longer.  Patient states that she use to take Novolin 70/30 25 units QAM with breakfast and 15 units QPM with supper. Patient reports that she was using vial and syringe for insulin injections and that she would prefer to continue with vial/syringe. Patient reports that she has everything needed for glucose monitoring but she is not checking glucose at home. However, when she was taking 70/30 insulin and checking glucose, she reports her glucose was 100-200's mg/dl.   Discussed A1C results (11.8% on 01/25/20 ) and explained that current A1C indicates an average glucose of 292 mg/dl over the past 2-3 months. Discussed glucose and A1C goals. Discussed importance of checking CBGs and maintaining good CBG control to prevent long-term and short-term complications. Explained how hyperglycemia leads to damage within blood vessels which lead to the common complications seen with uncontrolled diabetes. Stressed to the patient the importance of improving glycemic control to prevent further complications from uncontrolled diabetes. Discussed impact of nutrition, exercise, stress, sickness, and medications on diabetes control.  Encouraged patient to check glucose 4 times per day, take DM medications consistently, establish care with  PCP and follow up consistently to ensure she continues to get refills for medications and so medications can be adjusted if needed.  Patient verbalized understanding of information discussed and reports no further questions at this time related to diabetes. Current RN for today is off unit. Informed RN covering patient that patient is complaining of severe abdominal  pain and patient is tearful from pain. RN notes patient is getting pain medication and she will discuss with RN working with patient today. Placed consult for TOC to assist with finding PCP for follow up.  Thanks, Barnie Alderman, RN, MSN, CDE Diabetes Coordinator Inpatient Diabetes Program [phone] (Team Pager from 8am to 5pm)

## 2020-01-26 NOTE — Hospital Course (Signed)
Rachael Gray is a 39 y.o. female with medical history significant of DM, seizures, Lupus, gastroparesis, collagen vascular disease, presented again to the ED on evening of 01/24/20 for abdominal pain.  She was seen in the ED on 10/31 for abdominal pain and vomiting, diagnosed with acute pancreatitis with lipase of 52.  CT abdomen/pelvis however was negative for pertinent acute findings.  Her pain improved with IV dilaudid and she was discharged with prescription for Vicodin.  Patient returned to the ED reporting persistent abdominal pain, nausea and vomiting.  Found to be in DKA.  Admitted to ICU on insulin drip.   Cardiology was consulted due to elevated troponin which was attributed to demand ischemia.  11/3 - DKA resolved, off insuling drip this morning and transitioned to subcutaneous insulin.  Transferred to med/surg.  Ongoing abdominal pain.  Repeat troponin significantly elevated over 2900, Dr. Nehemiah Massed notified, recommended continuing current medical therapy, does not feel presentation consistent with ACS.  Further cardiology evaluation deferred until DKA resolves.

## 2020-01-26 NOTE — Progress Notes (Addendum)
PROGRESS NOTE    Rachael Gray   WFU:[id number]  DOB: March 05, 1981  PCP: Patient, No Pcp Per    DOA: 01/25/2020 LOS: 1   Brief Narrative   Rachael Gray is a 39 y.o. female with medical history significant of DM, seizures, Lupus, gastroparesis, collagen vascular disease, presented again to the ED on evening of 01/24/20 for abdominal pain.  She was seen in the ED on 10/31 for abdominal pain and vomiting, diagnosed with acute pancreatitis with lipase of 52.  CT abdomen/pelvis however was negative for pertinent acute findings.  Her pain improved with IV dilaudid and she was discharged with prescription for Vicodin.  Patient returned to the ED reporting persistent abdominal pain, nausea and vomiting.  Found to be in DKA.  Admitted to ICU on insulin drip.   Cardiology was consulted due to elevated troponin which was attributed to demand ischemia.  11/3 - DKA resolved, off insuling drip this morning and transitioned to subcutaneous insulin.  Transferred to med/surg.  Ongoing abdominal pain.  Repeat troponin significantly elevated over 2900, Dr. Nehemiah Massed notified, recommended continuing current medical therapy, does not feel presentation consistent with ACS.  Further cardiology evaluation deferred until DKA resolves.      Assessment & Plan   Active Problems:   Nausea & vomiting   Duodenitis   DKA (diabetic ketoacidosis) (HCC)   Tobacco abuse disorder   SIRS due to non-infectious process without acute organ dysfunction (HCC)   Cannabis use disorder, moderate, dependence ([place])   DKA, type 2 ([place])   DKA - POA, resolved.  Off insulin drip.  Continue Lantus 6 units daily, sliding scale Novolog.  Hypoglycemia protocol.  Insulin-dependent type 2 diabetes - mgmt as above.  Hbg A1c is 11.8%.  Gastroparesis - will schedule Reglan IV TID before meals.     Abdominal pain / Nausea / Vomiting - likely due to exacerbated gastroparesis in the setting of DKA and known duodenitis.  CT  abdomen/pelvis showed nothing acute.  Lactic acid today normal x 2 makes mesenteric ischemia unlikely, but in differential given her vascular disease.  Diagnosed with pancreatitis in ED day before based on lipase 52, yesterday lipase normal at 28, no findings on CT consistent with pancreatitis.   --schedule Reglan as above --trial of simethicone --bowel regimen --PRN norco ordered --would limit use of narcotics as these will slow GI tract and worsen gastroparesis (if need for IV pain medicine, give very low dose dilaudid due to morphine allergy)  Hypokalemia - K 3.1, replacing with 40 mEq x 2 today.  Monitor BMP and replace as needed.  Check Mg with AM labs.   Possible Sepsis on admission met SIRS criteria with leukocytosis and tachycardia and had lactic acidosis 2.4, in setting of DKA.  So far no source of infection has been identified.  Negative for influenza and Covid-19.  CT abdomen/pelvis had no acute intraabdominal findings. --UA was negative in the ED --Urine culture obtained grew multiple species, likely contaminated.   Pt has no dysuria or respiratory complaints.   Monitor clinically for s/sx's of infection.    Duodenitis - continue IV PPI for now until N/V resolves.  Outpatient GI follow up.  Elevated troponin - without chest pain, suspect demand ischemia.   Troponin repeated today, significant increase to 2922. --Cardiology following, doubts ACS, recommends medical management with antiplatelet and further evaluation once acute issues improved --D dimer this afternoon normal, and Echo normal, making PE very unlikely.    Tobacco abuse / Cannabis abuse -  daily use of marijuana.  Counseled on cessation of smoking and use of cannabis.    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 --Continue IV Keppra (sub'd for PO until tolerating PO intake).   DVT prophylaxis: heparin injection 5,000 Units Start: 01/25/20 2200 SCDs Start: 01/25/20 1235   Diet:  Diet Orders (From  admission, onward)    Start     Ordered   01/26/20 1054  Diet clear liquid Room service appropriate? Yes; Fluid consistency: Thin  Diet effective now       Question Answer Comment  Room service appropriate? Yes   Fluid consistency: Thin      01/26/20 1053            Code Status: Full Code    Subjective 01/26/20    Pt seen in ICU this AM.  She was sitting up in bed rocking back and forth.  Says having a lot of pain across lower abdomen, sharp.  Has not had any BM in 3 days.  Has not received clear liquid meal tray yet.     Disposition Plan & Communication   Status is: Inpatient  Inpatient status remains appropriate because of severity of illness, patient not tolerating adequate PO intake, ongoing abdominal pain  Dispo: The patient is from: home              Anticipated d/c is to: home              Anticipated d/c date is: 1-2 days              Patient currently is not medically stable for d/c.    Family Communication: none at bedside, will attempt to call this afternoon    Consults, Procedures, Significant Events   Consultants:   none  Procedures:   none  Antimicrobials:  Anti-infectives (From admission, onward)   None        Objective   Vitals:   01/26/20 0400 01/26/20 1200 01/26/20 1535 01/26/20 1723  BP:   132/70 105/63  Pulse:   (!) 103 85  Resp:   20 16  Temp: 98.2 F (36.8 C) 98.6 F (37 C) 98.4 F (36.9 C) 98.3 F (36.8 C)  TempSrc: Axillary Oral Oral Oral  SpO2:   98% 100%  Weight:      Height:        Intake/Output Summary (Last 24 hours) at 01/26/2020 2011 Last data filed at 01/26/2020 1400 Gross per 24 hour  Intake 3045.14 ml  Output 900 ml  Net 2145.14 ml   Filed Weights   01/25/20 0843 01/25/20 1800  Weight: 61.2 kg 60 kg    Physical Exam:  General exam: awake sitting up in bed rocking back and forth, alert, no acute distress HEENT: moist mucus membranes, hearing grossly normal  Respiratory system: CTAB, no wheezes, rales  or rhonchi, normal respiratory effort. Cardiovascular system: normal S1/S2, tachycardic, regular rhythm, no pedal edema.   Gastrointestinal system: soft, no tenderness or guarding on deep palpation with stethoscope, +bowel sounds, no distention, diffuse voluntary guarding on palpation with hands, no rebound tenderness Central nervous system: A&O x3. no gross focal neurologic deficits, normal speech Psychiatry: anxoius mood, congruent affect, judgement and insight appear normal  Labs   Data Reviewed: I have personally reviewed following labs and imaging studies  CBC: Recent Labs  Lab 01/23/20 1854 01/24/20 2140 01/25/20 0851 01/26/20 1131  WBC 8.2 8.9 18.9* 15.3*  HGB 14.2 13.9 14.9 11.8*  HCT 42.2 41.0 45.6  34.4*  MCV 84.6 84.2 84.4 82.7  PLT 258 245 303 591   Basic Metabolic Panel: Recent Labs  Lab 01/24/20 2140 01/25/20 0851 01/25/20 1332 01/25/20 1733 01/26/20 0905 01/26/20 1231  NA 134* 138  --  140 138 138  K 3.9 3.5  --  3.6 3.2* 3.1*  CL 97* 95*  --  104 101 102  CO2 22 16*  --  19* 22 22  GLUCOSE 292* 416*  --  123* 114* 144*  BUN 10 19  --  15 9 9   CREATININE 0.64 0.89  --  0.61 0.66 0.63  CALCIUM 9.6 10.3  --  9.1 9.6 9.2  MG  --   --  1.8  --   --   --   PHOS  --   --  3.8  --   --   --    GFR: Estimated Creatinine Clearance: 85.8 mL/min (by C-G formula based on SCr of 0.63 mg/dL). Liver Function Tests: Recent Labs  Lab 01/23/20 1854 01/24/20 2140 01/25/20 0851  AST 12* 14* 16  ALT 10 10 13   ALKPHOS 56 55 61  BILITOT 1.2 1.3* 1.6*  PROT 8.1 8.3* 9.3*  ALBUMIN 4.5 4.4 5.0   Recent Labs  Lab 01/23/20 1854 01/24/20 2140 01/25/20 0851 01/26/20 1231  LIPASE 52* 43 27 28   No results for input(s): AMMONIA in the last 168 hours. Coagulation Profile: No results for input(s): INR, PROTIME in the last 168 hours. Cardiac Enzymes: No results for input(s): CKTOTAL, CKMB, CKMBINDEX, TROPONINI in the last 168 hours. BNP (last 3 results) No results  for input(s): PROBNP in the last 8760 hours. HbA1C: Recent Labs    01/25/20 1332  HGBA1C 11.8*   CBG: Recent Labs  Lab 01/26/20 0728 01/26/20 0831 01/26/20 1119 01/26/20 1720 01/26/20 1959  GLUCAP 152* 163* 139* 257* 154*   Lipid Profile: No results for input(s): CHOL, HDL, LDLCALC, TRIG, CHOLHDL, LDLDIRECT in the last 72 hours. Thyroid Function Tests: No results for input(s): TSH, T4TOTAL, FREET4, T3FREE, THYROIDAB in the last 72 hours. Anemia Panel: No results for input(s): VITAMINB12, FOLATE, FERRITIN, TIBC, IRON, RETICCTPCT in the last 72 hours. Sepsis Labs: Recent Labs  Lab 01/25/20 1315 01/25/20 1332 01/26/20 0613 01/26/20 1420 01/26/20 1717  PROCALCITON  --  <0.10 <0.10  --   --   LATICACIDVEN 1.8 2.4*  --  1.2 0.9    Recent Results (from the past 240 hour(s))  Respiratory Panel by RT PCR (Flu A&B, Covid) - Nasopharyngeal Swab     Status: None   Collection Time: 01/23/20 10:24 PM   Specimen: Nasopharyngeal Swab  Result Value Ref Range Status   SARS Coronavirus 2 by RT PCR NEGATIVE NEGATIVE Final    Comment: (NOTE) SARS-CoV-2 target nucleic acids are NOT DETECTED.  The SARS-CoV-2 RNA is generally detectable in upper respiratoy specimens during the acute phase of infection. The lowest concentration of SARS-CoV-2 viral copies this assay can detect is 131 copies/mL. A negative result does not preclude SARS-Cov-2 infection and should not be used as the sole basis for treatment or other patient management decisions. A negative result may occur with  improper specimen collection/handling, submission of specimen other than nasopharyngeal swab, presence of viral mutation(s) within the areas targeted by this assay, and inadequate number of viral copies (<131 copies/mL). A negative result must be combined with clinical observations, patient history, and epidemiological information. The expected result is Negative.  Fact Sheet for Patients:    PinkCheek.be  Fact Sheet for Healthcare Providers:  GravelBags.it  This test is no t yet approved or cleared by the [place] FDA and  has been authorized for detection and/or diagnosis of SARS-CoV-2 by FDA under an Emergency Use Authorization (EUA). This EUA will remain  in effect (meaning this test can be used) for the duration of the COVID-19 declaration under Section 564(b)(1) of the Act, 21 U.S.C. section 360bbb-3(b)(1), unless the authorization is terminated or revoked sooner.     Influenza A by PCR NEGATIVE NEGATIVE Final   Influenza B by PCR NEGATIVE NEGATIVE Final    Comment: (NOTE) The Xpert Xpress SARS-CoV-2/FLU/RSV assay is intended as an aid in  the diagnosis of influenza from Nasopharyngeal swab specimens and  should not be used as a sole basis for treatment. Nasal washings and  aspirates are unacceptable for Xpert Xpress SARS-CoV-2/FLU/RSV  testing.  Fact Sheet for Patients: PinkCheek.be  Fact Sheet for Healthcare Providers: GravelBags.it  This test is not yet approved or cleared by the [place] FDA and  has been authorized for detection and/or diagnosis of SARS-CoV-2 by  FDA under an Emergency Use Authorization (EUA). This EUA will remain  in effect (meaning this test can be used) for the duration of the  Covid-19 declaration under Section 564(b)(1) of the Act, 21  U.S.C. section 360bbb-3(b)(1), unless the authorization is  terminated or revoked. Performed at [facility], [address], [place], [place] [address]   Urine Culture     Status: Abnormal   Collection Time: 01/25/20  8:51 AM   Specimen: Urine, Random  Result Value Ref Range Status   Specimen Description   Final    URINE, RANDOM Performed at [facility], [address] [place], [place] [address]    Special Requests   Final    NONE Performed at  [facility], [address] [place], [place] [address]    Culture MULTIPLE SPECIES PRESENT, SUGGEST RECOLLECTION (A)  Final   Report Status 01/26/2020 FINAL  Final  Respiratory Panel by RT PCR (Flu A&B, Covid) - Nasopharyngeal Swab     Status: None   Collection Time: 01/25/20 10:06 AM   Specimen: Nasopharyngeal Swab  Result Value Ref Range Status   SARS Coronavirus 2 by RT PCR NEGATIVE NEGATIVE Final    Comment: (NOTE) SARS-CoV-2 target nucleic acids are NOT DETECTED.  The SARS-CoV-2 RNA is generally detectable in upper respiratoy specimens during the acute phase of infection. The lowest concentration of SARS-CoV-2 viral copies this assay can detect is 131 copies/mL. A negative result does not preclude SARS-Cov-2 infection and should not be used as the sole basis for treatment or other patient management decisions. A negative result may occur with  improper specimen collection/handling, submission of specimen other than nasopharyngeal swab, presence of viral mutation(s) within the areas targeted by this assay, and inadequate number of viral copies (<131 copies/mL). A negative result must be combined with clinical observations, patient history, and epidemiological information. The expected result is Negative.  Fact Sheet for Patients:  PinkCheek.be  Fact Sheet for Healthcare Providers:  GravelBags.it  This test is no t yet approved or cleared by the [place] FDA and  has been authorized for detection and/or diagnosis of SARS-CoV-2 by FDA under an Emergency Use Authorization (EUA). This EUA will remain  in effect (meaning this test can be used) for the duration of the COVID-19 declaration under Section 564(b)(1) of the Act, 21 U.S.C. section 360bbb-3(b)(1), unless the authorization is terminated or  revoked sooner.     Influenza A by PCR NEGATIVE NEGATIVE Final   Influenza B by PCR NEGATIVE NEGATIVE Final      Comment: (NOTE) The Xpert Xpress SARS-CoV-2/FLU/RSV assay is intended as an aid in  the diagnosis of influenza from Nasopharyngeal swab specimens and  should not be used as a sole basis for treatment. Nasal washings and  aspirates are unacceptable for Xpert Xpress SARS-CoV-2/FLU/RSV  testing.  Fact Sheet for Patients: PinkCheek.be  Fact Sheet for Healthcare Providers: GravelBags.it  This test is not yet approved or cleared by the [place] FDA and  has been authorized for detection and/or diagnosis of SARS-CoV-2 by  FDA under an Emergency Use Authorization (EUA). This EUA will remain  in effect (meaning this test can be used) for the duration of the  Covid-19 declaration under Section 564(b)(1) of the Act, 21  U.S.C. section 360bbb-3(b)(1), unless the authorization is  terminated or revoked. Performed at [facility], [address] [place], [place] [address]   MRSA PCR Screening     Status: None   Collection Time: 01/25/20  6:50 PM   Specimen: Nasopharyngeal  Result Value Ref Range Status   MRSA by PCR NEGATIVE NEGATIVE Final    Comment:        The GeneXpert MRSA Assay (FDA approved for NASAL specimens only), is one component of a comprehensive MRSA colonization surveillance program. It is not intended to diagnose MRSA infection nor to guide or monitor treatment for MRSA infections. Performed at [facility], [address] [place], [place] [address]       Imaging Studies   ECHOCARDIOGRAM COMPLETE  Result Date: 01/26/2020    ECHOCARDIOGRAM REPORT   Patient Name:   BRECKIN ZAFAR Date of Exam: 01/25/2020 Medical Rec #:  [id number]          Height:       65.0 in Accession #:    [id number]         Weight:       132.3 lb Date of Birth:  Jul 04, 1980          BSA:          1.659 m Patient Age:    35 years           BP:           123/63 mmHg Patient Gender: F                  HR:            112 bpm. Exam Location:  [facility] Procedure: 2D Echo, Cardiac Doppler and Color Doppler Indications:     Elevated Troponin  History:         Patient has no prior history of Echocardiogram examinations.                  Risk Factors:Diabetes. Lupus. Seizures.  Sonographer:     [employer] [employer] Rodgers-Jones Referring Phys:  [place] Diagnosing Phys: Serafina Royals MD IMPRESSIONS  1. Left ventricular ejection fraction, by estimation, is 60 to 65%. The left ventricle has normal function. The left ventricle has no regional wall motion abnormalities. Left ventricular diastolic parameters were normal.  2. Right ventricular systolic function is normal. The right ventricular size is normal.  3. The mitral valve is normal in structure. Trivial mitral valve regurgitation.  4. The aortic valve is normal in structure. Aortic valve regurgitation is not visualized. FINDINGS  Left Ventricle: Left ventricular  ejection fraction, by estimation, is 60 to 65%. The left ventricle has normal function. The left ventricle has no regional wall motion abnormalities. The left ventricular internal cavity size was normal in size. There is  no left ventricular hypertrophy. Left ventricular diastolic parameters were normal. Right Ventricle: The right ventricular size is normal. No increase in right ventricular wall thickness. Right ventricular systolic function is normal. Left Atrium: Left atrial size was normal in size. Right Atrium: Right atrial size was normal in size. Pericardium: There is no evidence of pericardial effusion. Mitral Valve: The mitral valve is normal in structure. Trivial mitral valve regurgitation. Tricuspid Valve: The tricuspid valve is normal in structure. Tricuspid valve regurgitation is trivial. Aortic Valve: The aortic valve is normal in structure. Aortic valve regurgitation is not visualized. Pulmonic Valve: The pulmonic valve was normal in structure. Pulmonic valve regurgitation is not visualized. Aorta: The aortic root  and ascending aorta are structurally normal, with no evidence of dilitation. IAS/Shunts: No atrial level shunt detected by color flow Doppler.  LEFT VENTRICLE PLAX 2D LVIDd:         4.39 cm  Diastology LVIDs:         2.55 cm  LV e' medial:    9.68 cm/s LV PW:         0.83 cm  LV E/e' medial:  10.5 LV IVS:        0.70 cm  LV e' lateral:   13.10 cm/s LVOT diam:     1.90 cm  LV E/e' lateral: 7.8 LV SV:         62 LV SV Index:   37 LVOT Area:     2.84 cm  RIGHT VENTRICLE             IVC RV Basal diam:  3.01 cm     IVC diam: 1.54 cm RV S prime:     22.70 cm/s TAPSE (M-mode): 2.0 cm LEFT ATRIUM             Index       RIGHT ATRIUM          Index LA diam:        3.50 cm 2.11 cm/m  RA Area:     9.90 cm LA Vol (A2C):   39.5 ml 23.80 ml/m RA Volume:   22.10 ml 13.32 ml/m LA Vol (A4C):   28.8 ml 17.36 ml/m LA Biplane Vol: 36.2 ml 21.82 ml/m  AORTIC VALVE LVOT Vmax:   121.00 cm/s LVOT Vmean:  94.100 cm/s LVOT VTI:    0.218 m  AORTA Ao Root diam: 2.90 cm Ao Asc diam:  2.90 cm MITRAL VALVE MV Area (PHT): 4.21 cm     SHUNTS MV Decel Time: 180 msec     Systemic VTI:  0.22 m MV E velocity: 102.00 cm/s  Systemic Diam: 1.90 cm MV A velocity: 119.00 cm/s MV E/A ratio:  0.86 Serafina Royals MD Electronically signed by Serafina Royals MD Signature Date/Time: 01/26/2020/7:13:35 AM    Final      Medications   Scheduled Meds: . aspirin EC  81 mg Oral Daily  . atorvastatin  40 mg Oral QHS  . Chlorhexidine Gluconate Cloth  6 each Topical Daily  . heparin injection (subcutaneous)  5,000 Units Subcutaneous Q8H  . insulin aspart  0-9 Units Subcutaneous Q4H  . insulin glargine  6 Units Subcutaneous Daily  . levETIRAcetam  750 mg Oral Q12H  . metoCLOPramide (REGLAN) injection  10 mg Intravenous TID AC  Or  . metoCLOPramide  10 mg Oral TID AC  . nicotine  21 mg Transdermal Daily  . pantoprazole (PROTONIX) IV  40 mg Intravenous Q12H  . potassium chloride  40 mEq Oral Q4H  . thiamine  100 mg Oral Daily   Continuous  Infusions: . levETIRAcetam Stopped (01/26/20 1931)       LOS: 1 day    Time spent: 35 minutes with >50% spent in coordination of care and direct patient contact.    Ezekiel Slocumb, DO [facility]  01/26/2020, 8:11 PM    If 7PM-7AM, please contact night-coverage. How to contact the [facility] Attending or Consulting provider [place] or covering provider during after hours [place], for this patient?    1. Check the care team in [facility] and look for a) attending/consulting [facility] provider listed and b) the [facility] team listed 2. Log into [email or web address] and use 's universal password to access. If you do not have the password, please contact the hospital operator. 3. Locate the [facility] provider you are looking for under [facility] and page to a number that you can be directly reached. 4. If you still have difficulty reaching the provider, please page the [facility] (Director on Call) for the Hospitalists listed on amion for assistance.

## 2020-01-26 NOTE — Progress Notes (Signed)
Pt received from ICU via w/c. Pt rocking back and forth in pain. Pt tearful and states, "I had a vicodin, but it isn't working. I'm not going to complain, but it isn't helping at all." MD made aware.

## 2020-01-26 NOTE — Progress Notes (Signed)
Report called to RN on 1st floor, pt in no distress during transfer. VS at the time of transfer, no concerns.

## 2020-01-27 DIAGNOSIS — R112 Nausea with vomiting, unspecified: Secondary | ICD-10-CM | POA: Diagnosis not present

## 2020-01-27 DIAGNOSIS — E1110 Type 2 diabetes mellitus with ketoacidosis without coma: Secondary | ICD-10-CM | POA: Diagnosis not present

## 2020-01-27 DIAGNOSIS — K2980 Duodenitis without bleeding: Secondary | ICD-10-CM | POA: Diagnosis not present

## 2020-01-27 LAB — BASIC METABOLIC PANEL
Anion gap: 9 (ref 5–15)
BUN: 10 mg/dL (ref 6–20)
CO2: 26 mmol/L (ref 22–32)
Calcium: 9 mg/dL (ref 8.9–10.3)
Chloride: 101 mmol/L (ref 98–111)
Creatinine, Ser: 0.62 mg/dL (ref 0.44–1.00)
GFR, Estimated: >60 mL/min (ref >60)
Glucose, Bld: 224 mg/dL — ABNORMAL HIGH (ref 70–99)
Potassium: 3 mmol/L — ABNORMAL LOW (ref 3.5–5.1)
Sodium: 136 mmol/L (ref 135–145)

## 2020-01-27 LAB — CBC
HCT: 35.1 % — ABNORMAL LOW (ref 36.0–46.0)
Hemoglobin: 11.5 g/dL — ABNORMAL LOW (ref 12.0–15.0)
MCH: 28.3 pg (ref 26.0–34.0)
MCHC: 32.8 g/dL (ref 30.0–36.0)
MCV: 86.2 fL (ref 80.0–100.0)
Platelets: 215 10*3/uL (ref 150–400)
RBC: 4.07 MIL/uL (ref 3.87–5.11)
RDW: 12.9 % (ref 11.5–15.5)
WBC: 9.5 10*3/uL (ref 4.0–10.5)
nRBC: 0 % (ref 0.0–0.2)

## 2020-01-27 LAB — APTT: aPTT: 26 seconds (ref 24–36)

## 2020-01-27 LAB — TROPONIN I (HIGH SENSITIVITY)
Troponin I (High Sensitivity): 2078 ng/L (ref <18)
Troponin I (High Sensitivity): 1656 ng/L (ref <18)

## 2020-01-27 LAB — GLUCOSE, CAPILLARY
Glucose-Capillary: 211 mg/dL — ABNORMAL HIGH (ref 70–99)
Glucose-Capillary: 139 mg/dL — ABNORMAL HIGH (ref 70–99)
Glucose-Capillary: 229 mg/dL — ABNORMAL HIGH (ref 70–99)
Glucose-Capillary: 218 mg/dL — ABNORMAL HIGH (ref 70–99)
Glucose-Capillary: 113 mg/dL — ABNORMAL HIGH (ref 70–99)

## 2020-01-27 LAB — MAGNESIUM: Magnesium: 1.7 mg/dL (ref 1.7–2.4)

## 2020-01-27 LAB — PROTIME-INR
INR: 1 (ref 0.8–1.2)
Prothrombin Time: 12.3 seconds (ref 11.4–15.2)

## 2020-01-27 LAB — PROCALCITONIN: Procalcitonin: <0.1 ng/mL

## 2020-01-27 MED ORDER — ONDANSETRON HCL 4 MG/2ML IJ SOLN
4.0000 mg | Freq: Four times a day (QID) | INTRAMUSCULAR | Status: DC | PRN
  Administered 2020-01-27: 4 mg via INTRAVENOUS
  Filled 2020-01-27: qty 2

## 2020-01-27 MED ORDER — PANTOPRAZOLE SODIUM 40 MG PO TBEC
40.0000 mg | DELAYED_RELEASE_TABLET | Freq: Two times a day (BID) | ORAL | Status: DC
  Administered 2020-01-27 – 2020-01-28 (×2): 40 mg via ORAL
  Filled 2020-01-27 (×3): qty 1

## 2020-01-27 MED ORDER — ALPRAZOLAM 0.25 MG PO TABS: 0.2500 mg | ORAL_TABLET | Freq: Once | ORAL | Status: DC

## 2020-01-27 MED ORDER — INSULIN GLARGINE 100 UNIT/ML ~~LOC~~ SOLN
10.0000 [IU] | Freq: Every day | SUBCUTANEOUS | Status: DC
  Administered 2020-01-27 – 2020-01-28 (×2): 10 [IU] via SUBCUTANEOUS
  Filled 2020-01-27 (×3): qty 0.1

## 2020-01-27 MED ORDER — ALPRAZOLAM 0.5 MG PO TABS
0.5000 mg | ORAL_TABLET | Freq: Three times a day (TID) | ORAL | Status: DC | PRN
  Administered 2020-01-27 – 2020-01-28 (×3): 0.5 mg via ORAL
  Filled 2020-01-27 (×4): qty 1

## 2020-01-27 MED ORDER — SODIUM CHLORIDE 0.9% FLUSH
10.0000 mL | Freq: Two times a day (BID) | INTRAVENOUS | Status: DC
  Administered 2020-01-27 – 2020-01-28 (×2): 10 mL

## 2020-01-27 MED ORDER — POTASSIUM CHLORIDE 20 MEQ PO PACK
40.0000 meq | PACK | ORAL | Status: AC
  Administered 2020-01-27 (×2): 40 meq via ORAL
  Filled 2020-01-27 (×3): qty 2

## 2020-01-27 MED ORDER — METHOCARBAMOL 1000 MG/10ML IJ SOLN
500.0000 mg | Freq: Three times a day (TID) | INTRAVENOUS | Status: DC | PRN
  Filled 2020-01-27 (×3): qty 5

## 2020-01-27 MED ORDER — TRAZODONE HCL 50 MG PO TABS
25.0000 mg | ORAL_TABLET | Freq: Once | ORAL | Status: AC
  Administered 2020-01-28: 25 mg via ORAL
  Filled 2020-01-27: qty 1

## 2020-01-27 MED ORDER — METOPROLOL TARTRATE 25 MG PO TABS
12.5000 mg | ORAL_TABLET | Freq: Two times a day (BID) | ORAL | Status: DC
  Administered 2020-01-27 – 2020-01-28 (×2): 12.5 mg via ORAL
  Filled 2020-01-27 (×2): qty 1

## 2020-01-27 MED ORDER — METHOCARBAMOL 500 MG PO TABS
500.0000 mg | ORAL_TABLET | Freq: Three times a day (TID) | ORAL | Status: DC | PRN
  Administered 2020-01-27: 500 mg via ORAL
  Filled 2020-01-27 (×2): qty 1

## 2020-01-27 MED ORDER — INSULIN ASPART 100 UNIT/ML ~~LOC~~ SOLN
0.0000 [IU] | Freq: Three times a day (TID) | SUBCUTANEOUS | Status: DC
  Administered 2020-01-27 (×2): 2 [IU] via SUBCUTANEOUS
  Administered 2020-01-28: 10:00:00 1 [IU] via SUBCUTANEOUS
  Filled 2020-01-27 (×3): qty 1

## 2020-01-27 MED ORDER — HEPARIN BOLUS VIA INFUSION
2000.0000 [IU] | Freq: Once | INTRAVENOUS | Status: AC
  Administered 2020-01-27: 12:00:00 2000 [IU] via INTRAVENOUS
  Filled 2020-01-27: qty 2000

## 2020-01-27 MED ORDER — HEPARIN (PORCINE) 25000 UT/250ML-% IV SOLN
900.0000 [IU]/h | INTRAVENOUS | Status: DC
  Administered 2020-01-27: 13:00:00 700 [IU]/h via INTRAVENOUS
  Filled 2020-01-27: qty 250

## 2020-01-27 MED ORDER — INSULIN ASPART 100 UNIT/ML ~~LOC~~ SOLN: 0.0000 [IU] | Freq: Every day | SUBCUTANEOUS | Status: DC

## 2020-01-27 MED ORDER — SODIUM CHLORIDE 0.9% FLUSH: 10.0000 mL | INTRAVENOUS | Status: DC | PRN

## 2020-01-27 MED ORDER — NITROGLYCERIN 0.4 MG SL SUBL
0.4000 mg | SUBLINGUAL_TABLET | SUBLINGUAL | Status: DC | PRN
  Administered 2020-01-27: 10:00:00 0.4 mg via SUBLINGUAL
  Filled 2020-01-27: qty 1

## 2020-01-27 NOTE — Progress Notes (Signed)
PROGRESS NOTE    Rachael Gray   YME:[id number]  DOB: May 23, 1980  PCP: Patient, No Pcp Per    DOA: 01/25/2020 LOS: 2   Brief Narrative   Rachael Gray is a 39 y.o. female with medical history significant of DM, seizures, Lupus, gastroparesis, collagen vascular disease, presented again to the ED on evening of 01/24/20 for abdominal pain.  She was seen in the ED on 10/31 for abdominal pain and vomiting, diagnosed with acute pancreatitis with lipase of 52.  CT abdomen/pelvis however was negative for pertinent acute findings.  Her pain improved with IV dilaudid and she was discharged with prescription for Vicodin.  Patient returned to the ED reporting persistent abdominal pain, nausea and vomiting.  Found to be in DKA.  Admitted to ICU on insulin drip.   Cardiology was consulted due to elevated troponin which was attributed to demand ischemia.  11/3 - DKA resolved, off insuling drip this morning and transitioned to subcutaneous insulin.  Transferred to med/surg.  Ongoing abdominal pain.  Repeat troponin significantly elevated over 2900, Dr. Nehemiah Massed notified, recommended continuing current medical therapy, does not feel presentation consistent with ACS.  Further cardiology evaluation deferred until DKA resolves.      Assessment & Plan   Active Problems:   Nausea & vomiting   Duodenitis   DKA (diabetic ketoacidosis) (HCC)   Tobacco abuse disorder   SIRS due to non-infectious process without acute organ dysfunction (HCC)   Cannabis use disorder, moderate, dependence ([place])   DKA, type 2 ([place])   DKA - POA, resolved.  Off insulin drip.  Increase Lantus to 10 units daily, sliding scale Novolog AC/HS.  Hypoglycemia protocol.  Insulin-dependent type 2 diabetes - mgmt as above.  Hbg A1c is 11.8%.  Gastroparesis - Continue scheduled Reglan TID before meals, transition to oral today.     Abdominal pain / Nausea / Vomiting - likely due to exacerbated gastroparesis in the setting of DKA  and known duodenitis.  CT abdomen/pelvis showed nothing acute.  Lactic acid today normal x 2 makes mesenteric ischemia unlikely, but in differential given her vascular disease.  Diagnosed with pancreatitis in ED day before based on lipase 52, yesterday lipase normal at 28, no findings on CT consistent with pancreatitis.   --schedule Reglan as above --trial of simethicone --bowel regimen --PRN norco ordered --would limit use of narcotics as these will slow GI tract and worsen gastroparesis (if need for IV pain medicine, give very low dose dilaudid due to morphine allergy)  Hypokalemia - K 3.1, replacing with 40 mEq x 2 today.  Monitor BMP and replace as needed.  Check Mg with AM labs.  Possible Sepsis on admission met SIRS criteria with leukocytosis and tachycardia and had lactic acidosis 2.4, in setting of DKA.  So far no source of infection has been identified.  Negative for influenza and Covid-19.  CT abdomen/pelvis had no acute intraabdominal findings. --UA was negative in the ED --Urine culture obtained grew multiple species, likely contaminated.   Pt has no dysuria or respiratory complaints.   Monitor clinically for s/sx's of infection.    Duodenitis - resume oral Protonix BID.  Outpatient GI follow up.  Chest pain - on rounds this AM (11/4) pt complaining of CP, it was relieved by sublingual nitroglycerin.  Mgmt as below.  Elevated troponin - initially without chest pain which she later developed on 11/4.  Demand ischemia vs NSTEMI. Troponin peaked at 2922 on 11/3, repeat in setting of active chest pain  today 2078 >>1656  -D dimer was normal, Echo normal, making PE very unlikely.  --Cardiology following, continues to doubt ACS in this case. --per cardiology, continue medical management with ASA, statin, beta blocker  Tobacco abuse / Cannabis abuse - daily use of marijuana.  Counseled on cessation of smoking and use of cannabis.   Doubt current presentation related to cannabinoid  hyperemesis syndrome because cessation of use typically improves symptoms.  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 --Resume PO Keppra since tolerating oral intake   DVT prophylaxis: SCDs Start: 01/25/20 1235   Diet:  Diet Orders (From admission, onward)    Start     Ordered   01/27/20 0737  Diet Carb Modified Fluid consistency: Thin; Room service appropriate? Yes  Diet effective now       Question Answer Comment  Diet-HS Snack? Nothing   Calorie Level Medium 1600-2000   Fluid consistency: Thin   Room service appropriate? Yes      01/27/20 0736            Code Status: Full Code    Subjective 01/27/20    Pt seen this AM at bedside.  She was again sitting up rocking back and forth in bed.  She complains of central chest pain, pressure-like, that had just started a few minutes beforehand.  Some associated shortness of breath and subjective diaphoresis.  Says her abdominal pain is improved today, still there but not as severe and says she does not need pain medicine for it.    Disposition Plan & Communication   Status is: Inpatient  Inpatient status remains appropriate because of severity of illness, patient not tolerating adequate PO intake with attempt to advance diet today.  Having chest pain in setting of troponin over 2000 and cardiology consult pending.  Dispo: The patient is from: home              Anticipated d/c is to: home              Anticipated d/c date is: 1-2 days              Patient currently is not medically stable for d/c.    Family Communication: none at bedside, will attempt to call this afternoon    Consults, Procedures, Significant Events   Consultants:   none  Procedures:   none  Antimicrobials:  Anti-infectives (From admission, onward)   None        Objective   Vitals:   01/27/20 0006 01/27/20 0428 01/27/20 0843 01/27/20 1144  BP: 101/63 101/61 109/72 115/74  Pulse: 89 93 90 99  Resp: _0 Temp: 98.2 F  (36.8 C) 98.4 F (36.9 C) 98.2 F (36.8 C) 98 F (36.7 C)  TempSrc: Oral  Oral   SpO2: 99% 99% 98% 95%  Weight:      Height:        Intake/Output Summary (Last 24 hours) at 01/27/2020 1345 Last data filed at 01/27/2020 1031 Gross per 24 hour  Intake 240 ml  Output 300 ml  Net -60 ml   Filed Weights   01/25/20 0843 01/25/20 1800  Weight: 61.2 kg 60 kg    Physical Exam:  General exam: awake, is again sitting up in bed rocking back and forth, alert, no acute distress Respiratory system: CTAB, no wheezes, rales or rhonchi, normal respiratory effort. Cardiovascular system: normal S1/S2, RRR, no pedal edema.   Gastrointestinal system: soft, non-tender, +bowel sounds Central nervous system:  A&O x3. no gross focal neurologic deficits, normal speech Psychiatry: anxoius mood, congruent affect, judgement and insight appear normal  Labs   Data Reviewed: I have personally reviewed following labs and imaging studies  CBC: Recent Labs  Lab 01/23/20 1854 01/24/20 2140 01/25/20 0851 01/26/20 1131 01/27/20 0530  WBC 8.2 8.9 18.9* 15.3* 9.5  HGB 14.2 13.9 14.9 11.8* 11.5*  HCT 42.2 41.0 45.6 34.4* 35.1*  MCV 84.6 84.2 84.4 82.7 86.2  PLT 258 245 303 231 716   Basic Metabolic Panel: Recent Labs  Lab 01/25/20 0851 01/25/20 1332 01/25/20 1733 01/26/20 0905 01/26/20 1231 01/27/20 0530  NA 138  --  140 138 138 136  K 3.5  --  3.6 3.2* 3.1* 3.0*  CL 95*  --  104 101 102 101  CO2 16*  --  19* _0 GLUCOSE 416*  --  123* 114* 144* 224*  BUN 19  --  _1 CREATININE 0.89  --  0.61 0.66 0.63 0.62  CALCIUM 10.3  --  9.1 9.6 9.2 9.0  MG  --  1.8  --   --   --  1.7  PHOS  --  3.8  --   --   --   --    GFR: Estimated Creatinine Clearance: 85.8 mL/min (by C-G formula based on SCr of 0.62 mg/dL). Liver Function Tests: Recent Labs  Lab 01/23/20 1854 01/24/20 2140 01/25/20 0851  AST 12* 14* 16  ALT _2 ALKPHOS 56 55 61  BILITOT 1.2 1.3* 1.6*  PROT 8.1 8.3*  9.3*  ALBUMIN 4.5 4.4 5.0   Recent Labs  Lab 01/23/20 1854 01/24/20 2140 01/25/20 0851 01/26/20 1231  LIPASE 52* 43 27 28   No results for input(s): AMMONIA in the last 168 hours. Coagulation Profile: Recent Labs  Lab 01/27/20 1216  INR 1.0   Cardiac Enzymes: No results for input(s): CKTOTAL, CKMB, CKMBINDEX, TROPONINI in the last 168 hours. BNP (last 3 results) No results for input(s): PROBNP in the last 8760 hours. HbA1C: Recent Labs    01/25/20 1332  HGBA1C 11.8*   CBG: Recent Labs  Lab 01/26/20 1959 01/26/20 2311 01/27/20 0428 01/27/20 0734 01/27/20 1146  GLUCAP 154* 110* 211* 139* 229*   Lipid Profile: No results for input(s): CHOL, HDL, LDLCALC, TRIG, CHOLHDL, LDLDIRECT in the last 72 hours. Thyroid Function Tests: No results for input(s): TSH, T4TOTAL, FREET4, T3FREE, THYROIDAB in the last 72 hours. Anemia Panel: No results for input(s): VITAMINB12, FOLATE, FERRITIN, TIBC, IRON, RETICCTPCT in the last 72 hours. Sepsis Labs: Recent Labs  Lab 01/25/20 1315 01/25/20 1332 01/26/20 0613 01/26/20 1420 01/26/20 1717 01/27/20 0530  PROCALCITON  --  <0.10 <0.10  --   --  <0.10  LATICACIDVEN 1.8 2.4*  --  1.2 0.9  --     Recent Results (from the past 240 hour(s))  Respiratory Panel by RT PCR (Flu A&B, Covid) - Nasopharyngeal Swab     Status: None   Collection Time: 01/23/20 10:24 PM   Specimen: Nasopharyngeal Swab  Result Value Ref Range Status   SARS Coronavirus 2 by RT PCR NEGATIVE NEGATIVE Final    Comment: (NOTE) SARS-CoV-2 target nucleic acids are NOT DETECTED.  The SARS-CoV-2 RNA is generally detectable in upper respiratoy specimens during the acute phase of infection. The lowest concentration of SARS-CoV-2 viral copies this assay can detect is 131 copies/mL. A negative result does not preclude SARS-Cov-2 infection and should not be used  as the sole basis for treatment or other patient management decisions. A negative result may occur with    improper specimen collection/handling, submission of specimen other than nasopharyngeal swab, presence of viral mutation(s) within the areas targeted by this assay, and inadequate number of viral copies (<131 copies/mL). A negative result must be combined with clinical observations, patient history, and epidemiological information. The expected result is Negative.  Fact Sheet for Patients:  PinkCheek.be  Fact Sheet for Healthcare Providers:  GravelBags.it  This test is no t yet approved or cleared by the [place] FDA and  has been authorized for detection and/or diagnosis of SARS-CoV-2 by FDA under an Emergency Use Authorization (EUA). This EUA will remain  in effect (meaning this test can be used) for the duration of the COVID-19 declaration under Section 564(b)(1) of the Act, 21 U.S.C. section 360bbb-3(b)(1), unless the authorization is terminated or revoked sooner.     Influenza A by PCR NEGATIVE NEGATIVE Final   Influenza B by PCR NEGATIVE NEGATIVE Final    Comment: (NOTE) The Xpert Xpress SARS-CoV-2/FLU/RSV assay is intended as an aid in  the diagnosis of influenza from Nasopharyngeal swab specimens and  should not be used as a sole basis for treatment. Nasal washings and  aspirates are unacceptable for Xpert Xpress SARS-CoV-2/FLU/RSV  testing.  Fact Sheet for Patients: PinkCheek.be  Fact Sheet for Healthcare Providers: GravelBags.it  This test is not yet approved or cleared by the [place] FDA and  has been authorized for detection and/or diagnosis of SARS-CoV-2 by  FDA under an Emergency Use Authorization (EUA). This EUA will remain  in effect (meaning this test can be used) for the duration of the  Covid-19 declaration under Section 564(b)(1) of the Act, 21  U.S.C. section 360bbb-3(b)(1), unless the authorization is  terminated or  revoked. Performed at [facility], [address], [place], [place] [address]   Urine Culture     Status: Abnormal   Collection Time: 01/25/20  8:51 AM   Specimen: Urine, Random  Result Value Ref Range Status   Specimen Description   Final    URINE, RANDOM Performed at [facility], [address], [place], [place] [address]    Special Requests   Final    NONE Performed at [facility], [address] [place], [place] [address]    Culture MULTIPLE SPECIES PRESENT, SUGGEST RECOLLECTION (A)  Final   Report Status 01/26/2020 FINAL  Final  Respiratory Panel by RT PCR (Flu A&B, Covid) - Nasopharyngeal Swab     Status: None   Collection Time: 01/25/20 10:06 AM   Specimen: Nasopharyngeal Swab  Result Value Ref Range Status   SARS Coronavirus 2 by RT PCR NEGATIVE NEGATIVE Final    Comment: (NOTE) SARS-CoV-2 target nucleic acids are NOT DETECTED.  The SARS-CoV-2 RNA is generally detectable in upper respiratoy specimens during the acute phase of infection. The lowest concentration of SARS-CoV-2 viral copies this assay can detect is 131 copies/mL. A negative result does not preclude SARS-Cov-2 infection and should not be used as the sole basis for treatment or other patient management decisions. A negative result may occur with  improper specimen collection/handling, submission of specimen other than nasopharyngeal swab, presence of viral mutation(s) within the areas targeted by this assay, and inadequate number of viral copies (<131 copies/mL). A negative result must be combined with clinical observations, patient history, and epidemiological information. The expected result is Negative.  Fact Sheet for Patients:  PinkCheek.be  Fact  Sheet for Healthcare Providers:  GravelBags.it  This test is no t yet approved or cleared by the [place] FDA and  has been authorized for detection and/or  diagnosis of SARS-CoV-2 by FDA under an Emergency Use Authorization (EUA). This EUA will remain  in effect (meaning this test can be used) for the duration of the COVID-19 declaration under Section 564(b)(1) of the Act, 21 U.S.C. section 360bbb-3(b)(1), unless the authorization is terminated or revoked sooner.     Influenza A by PCR NEGATIVE NEGATIVE Final   Influenza B by PCR NEGATIVE NEGATIVE Final    Comment: (NOTE) The Xpert Xpress SARS-CoV-2/FLU/RSV assay is intended as an aid in  the diagnosis of influenza from Nasopharyngeal swab specimens and  should not be used as a sole basis for treatment. Nasal washings and  aspirates are unacceptable for Xpert Xpress SARS-CoV-2/FLU/RSV  testing.  Fact Sheet for Patients: PinkCheek.be  Fact Sheet for Healthcare Providers: GravelBags.it  This test is not yet approved or cleared by the [place] FDA and  has been authorized for detection and/or diagnosis of SARS-CoV-2 by  FDA under an Emergency Use Authorization (EUA). This EUA will remain  in effect (meaning this test can be used) for the duration of the  Covid-19 declaration under Section 564(b)(1) of the Act, 21  U.S.C. section 360bbb-3(b)(1), unless the authorization is  terminated or revoked. Performed at [facility], [address] [place], [place] [address]   MRSA PCR Screening     Status: None   Collection Time: 01/25/20  6:50 PM   Specimen: Nasopharyngeal  Result Value Ref Range Status   MRSA by PCR NEGATIVE NEGATIVE Final    Comment:        The GeneXpert MRSA Assay (FDA approved for NASAL specimens only), is one component of a comprehensive MRSA colonization surveillance program. It is not intended to diagnose MRSA infection nor to guide or monitor treatment for MRSA infections. Performed at [facility], [address] [place], [place] [address]       Imaging Studies    ECHOCARDIOGRAM COMPLETE  Result Date: 01/26/2020    ECHOCARDIOGRAM REPORT   Patient Name:   Rachael Gray Date of Exam: 01/25/2020 Medical Rec #:  [id number]          Height:       65.0 in Accession #:    [id number]         Weight:       132.3 lb Date of Birth:  06-May-1980          BSA:          1.659 m Patient Age:    5 years           BP:           123/63 mmHg Patient Gender: F                  HR:           112 bpm. Exam Location:  [facility] Procedure: 2D Echo, Cardiac Doppler and Color Doppler Indications:     Elevated Troponin  History:         Patient has no prior history of Echocardiogram examinations.                  Risk Factors:Diabetes. Lupus. Seizures.  Sonographer:     Wilford Sports Rodgers-Jones Referring Phys:  [place] Diagnosing Phys: Serafina Royals MD IMPRESSIONS  1. Left ventricular ejection fraction,  by estimation, is 60 to 65%. The left ventricle has normal function. The left ventricle has no regional wall motion abnormalities. Left ventricular diastolic parameters were normal.  2. Right ventricular systolic function is normal. The right ventricular size is normal.  3. The mitral valve is normal in structure. Trivial mitral valve regurgitation.  4. The aortic valve is normal in structure. Aortic valve regurgitation is not visualized. FINDINGS  Left Ventricle: Left ventricular ejection fraction, by estimation, is 60 to 65%. The left ventricle has normal function. The left ventricle has no regional wall motion abnormalities. The left ventricular internal cavity size was normal in size. There is  no left ventricular hypertrophy. Left ventricular diastolic parameters were normal. Right Ventricle: The right ventricular size is normal. No increase in right ventricular wall thickness. Right ventricular systolic function is normal. Left Atrium: Left atrial size was normal in size. Right Atrium: Right atrial size was normal in size. Pericardium: There is no evidence of pericardial effusion.  Mitral Valve: The mitral valve is normal in structure. Trivial mitral valve regurgitation. Tricuspid Valve: The tricuspid valve is normal in structure. Tricuspid valve regurgitation is trivial. Aortic Valve: The aortic valve is normal in structure. Aortic valve regurgitation is not visualized. Pulmonic Valve: The pulmonic valve was normal in structure. Pulmonic valve regurgitation is not visualized. Aorta: The aortic root and ascending aorta are structurally normal, with no evidence of dilitation. IAS/Shunts: No atrial level shunt detected by color flow Doppler.  LEFT VENTRICLE PLAX 2D LVIDd:         4.39 cm  Diastology LVIDs:         2.55 cm  LV e' medial:    9.68 cm/s LV PW:         0.83 cm  LV E/e' medial:  10.5 LV IVS:        0.70 cm  LV e' lateral:   13.10 cm/s LVOT diam:     1.90 cm  LV E/e' lateral: 7.8 LV SV:         62 LV SV Index:   37 LVOT Area:     2.84 cm  RIGHT VENTRICLE             IVC RV Basal diam:  3.01 cm     IVC diam: 1.54 cm RV S prime:     22.70 cm/s TAPSE (M-mode): 2.0 cm LEFT ATRIUM             Index       RIGHT ATRIUM          Index LA diam:        3.50 cm 2.11 cm/m  RA Area:     9.90 cm LA Vol (A2C):   39.5 ml 23.80 ml/m RA Volume:   22.10 ml 13.32 ml/m LA Vol (A4C):   28.8 ml 17.36 ml/m LA Biplane Vol: 36.2 ml 21.82 ml/m  AORTIC VALVE LVOT Vmax:   121.00 cm/s LVOT Vmean:  94.100 cm/s LVOT VTI:    0.218 m  AORTA Ao Root diam: 2.90 cm Ao Asc diam:  2.90 cm MITRAL VALVE MV Area (PHT): 4.21 cm     SHUNTS MV Decel Time: 180 msec     Systemic VTI:  0.22 m MV E velocity: 102.00 cm/s  Systemic Diam: 1.90 cm MV A velocity: 119.00 cm/s MV E/A ratio:  0.86 Serafina Royals MD Electronically signed by Serafina Royals MD Signature Date/Time: 01/26/2020/7:13:35 AM    Final      Medications  Scheduled Meds:  aspirin EC  81 mg Oral Daily   atorvastatin  40 mg Oral QHS   Chlorhexidine Gluconate Cloth  6 each Topical Daily   insulin aspart  0-5 Units Subcutaneous QHS   insulin aspart   0-6 Units Subcutaneous TID WC   insulin glargine  10 Units Subcutaneous Daily   levETIRAcetam  750 mg Oral Q12H   metoCLOPramide (REGLAN) injection  10 mg Intravenous TID AC   Or   metoCLOPramide  10 mg Oral TID AC   metoprolol tartrate  12.5 mg Oral BID   nicotine  21 mg Transdermal Daily   pantoprazole (PROTONIX) IV  40 mg Intravenous Q12H   polyethylene glycol  17 g Oral Daily   potassium chloride  40 mEq Oral Q4H   thiamine  100 mg Oral Daily   Continuous Infusions:  heparin 700 Units/hr (01/27/20 1236)   levETIRAcetam Stopped (01/26/20 1931)       LOS: 2 days    Time spent: 30 minutes with >50% spent in coordination of care and direct patient contact.    Ezekiel Slocumb, DO [facility]  01/27/2020, 1:45 PM    If 7PM-7AM, please contact night-coverage. How to contact the [facility] Attending or Consulting provider [place] or covering provider during after hours [place], for this patient?    1. Check the care team in [facility] and look for a) attending/consulting [facility] provider listed and b) the [facility] team listed 2. Log into [email or web address] and use [place]'s universal password to access. If you do not have the password, please contact the hospital operator. 3. Locate the [facility] provider you are looking for under [facility] and page to a number that you can be directly reached. 4. If you still have difficulty reaching the provider, please page the [facility] (Director on Call) for the Hospitalists listed on amion for assistance.

## 2020-01-27 NOTE — Progress Notes (Signed)
Patient complained of chest pain with pressure feeling. Notified MD and charge nurse. Received order to administer nitro and do EKG.Administered 1st dose of nitroglycerin at 10:02, waited 5 minutes, assessed pain at 10:07.After 5 minutes, patient states pain "feels much better". Noticed patient is having shaking movements of entire body. Asked patient if she is ok, and patient states that she "is fine and the shaking is a mechanism". Shaking may possibly be related to patient's history of pseudoseizures

## 2020-01-27 NOTE — TOC Initial Note (Signed)
Transition of Care [facility]) - Initial/Assessment Note    Patient Details  Name: Rachael Gray MRN: [id number] Date of Birth: Sep 03, 1980  Transition of Care [facility]) CM/SW Contact:    Shelbie Hutching, RN Phone Number: 01/27/2020, 11:13 AM  Clinical Narrative:                 RNCM introduced self to patient and explained reason for visit (consult for PCP).  Patient is talking on the phone and rocking back and forth in the bed.  Patient states "I don't want to talk about this now or anything come back later".  TOC will follow up at later time.   Expected Discharge Plan: Home/Self Care Barriers to Discharge: Continued Medical Work up   Patient Goals and CMS Choice Patient states their goals for this hospitalization and ongoing recovery are:: does not want to talk about anything right now      Expected Discharge Plan and Services Expected Discharge Plan: Home/Self Care   Discharge Planning Services: CM Consult   Living arrangements for the past 2 months: Single Family Home                                      Prior Living Arrangements/Services Living arrangements for the past 2 months: Single Family Home   Patient language and need for interpreter reviewed:: Yes (English) Do you feel safe going back to the place where you live?: Yes      Need for Family Participation in Patient Care: Yes (Comment)     Criminal Activity/Legal Involvement Pertinent to Current Situation/Hospitalization: No - Comment as needed  Activities of Daily Living      Permission Sought/Granted   Permission granted to share information with : No              Emotional Assessment Appearance:: Appears stated age Attitude/Demeanor/Rapport: Avoidant, Uncooperative, Self-Absorbed Affect (typically observed): Irritable Orientation: : Oriented to Self, Oriented to Place, Oriented to  Time, Oriented to Situation Alcohol / Substance Use: Not Applicable Psych Involvement: No  (comment)  Admission diagnosis:  Dehydration [E86.0] DKA (diabetic ketoacidosis) ([place]) [E11.10] DKA, type 2 ([place]) [E11.10] Diabetic ketoacidosis without coma associated with type 2 diabetes mellitus ([place]) [E11.10] Patient Active Problem List   Diagnosis Date Noted  . DKA (diabetic ketoacidosis) ([place]) 01/25/2020  . Tobacco abuse disorder 01/25/2020  . SIRS due to non-infectious process without acute organ dysfunction ([place]) 01/25/2020  . DKA, type 2 ([place]) 01/25/2020  . Cannabis use disorder, moderate, dependence ([place]) 12/01/2016  . 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 Hypokalemia 12/24/2015  . Anemia 12/24/2015  . Diabetes ([place]) 12/24/2015  . Gastroparesis due to DM ([place])   . Acute respiratory alkalosis 12/22/2015  . Duodenitis 12/22/2015  . Vomiting 12/16/2015  . Nausea & vomiting 12/10/2014  . Narcotic abuse ([place]) 12/09/2014  . Sepsis ([place]) 12/07/2014  . Anxiety   . Pyelonephritis 12/04/2014  . Lactic acidosis 12/04/2014    Class: Acute   PCP:  Patient, No Pcp Per Pharmacy:   [id number] - [place], [place] - [address] [address] Phone: [phone] Fax: [phone]  [id number] - [place], [place] - [address] [place] [address] Phone: [phone] Fax: [phone]     Social Determinants of Health (SDOH) Interventions    Readmission Risk Interventions No flowsheet data found.

## 2020-01-27 NOTE — Progress Notes (Signed)
ANTICOAGULATION CONSULT NOTE - Initial Consult  Pharmacy Consult for Heparin Infusion  Indication: chest pain/ACS  Allergies  Allergen Reactions  . Dilantin [Phenytoin Sodium Extended] Hives  . Doxycycline Swelling  . Other   . Morphine And Related Rash  . Tylenol [Acetaminophen] Rash    Patient Measurements: Height: 5\' 5"  (165.1 cm) Weight: 60 kg (132 lb 4.4 oz) IBW/kg (Calculated) : 57  Vital Signs: Temp: 98 F (36.7 C) (11/04 1144) Temp Source: Oral (11/04 0843) BP: 115/74 (11/04 1144) Pulse Rate: 99 (11/04 1144)  Labs: Recent Labs    01/25/20 0851 01/25/20 0851 01/25/20 1315 01/25/20 1733 01/26/20 0905 01/26/20 1131 01/26/20 1231 01/26/20 2107 01/27/20 0530 01/27/20 0938  HGB 14.9   < >  --   --   --  11.8*  --   --  11.5*  --   HCT 45.6  --   --   --   --  34.4*  --   --  35.1*  --   PLT 303  --   --   --   --  231  --   --  215  --   CREATININE 0.89  --    < >  --  0.66  --  0.63  --  0.62  --   TROPONINIHS  --   --    < >   < >  --   --  2,922* 2,358*  --  2,078*   < > = values in this interval not displayed.    Estimated Creatinine Clearance: 85.8 mL/min (by C-G formula based on SCr of 0.62 mg/dL).   Medical History: Past Medical History:  Diagnosis Date  . Collagen vascular disease ([place])   . Diabetes mellitus without complication ([place])   . Gastroparesis   . Lupus ([place])   . Pseudoseizures ([place])   . Seizures [facility])      Assessment: 39 yo female admitted with N/V and DKA. Patient now complaining of chest pain. Pharmacy consulted for heparin infusion dosing and monitoring. Patient has been receiving heparin 5000 units SubQ every 8 hours. Last doses received this morning @ 0453   Troponin HS 2358>>2078  Goal of Therapy:  Heparin level 0.3-0.7 units/ml Monitor platelets by anticoagulation protocol: Yes   Plan:  Patient received heparin subQ @ 0453 this morning.  Will give reduced bolus bolus of 2000 x 1 Start heparin infusion at 700  units/hr Check anti-Xa level in 6 hours and daily while on heparin Continue to monitor H&H and platelets  Pernell Dupre, PharmD, BCPS Clinical Pharmacist 01/27/2020 12:02 PM

## 2020-01-27 NOTE — Progress Notes (Signed)
[facility] Cardiology [facility] Encounter Note  Patient: Rachael Gray / Admit Date: 01/25/2020 / Date of Encounter: 01/27/2020, 1:18 PM   Subjective: Patient overall feeling better since admission.  No further episodes of abdominal discomfort for which she was admitted for.  That abdominal discomfort did cause some amount of chest discomfort and she has had some atypical fleeting chest discomfort off and on in the last 24 hours.  EKG has shown sinus tachycardia with nonspecific ST changes on admission and now has normal sinus rhythm with nonspecific ST changes.  No evidence of new apparent acute coronary syndrome.  Patient has had waxing and waning troponin that has slowly increased from admission of 220, 125, 581, 2922, 2358, 2078, 1656.  It appears that this still is more consistent with demand ischemia rather than acute coronary syndrome at this time.  We have discussed at length with the patient about further potential intervention and/or assessment.  The patient claims that she feels fine at this time and has no concerns of her heart and wishes to use more conservative care at this time.  She has been placed on single antiplatelet therapy and atorvastatin for which she has tolerated.  Additionally we will consider the possibility of low-dose beta-blocker for further treatment and follow-up for improvements  Echocardiogram showing normal LV systolic function with ejection fraction of 60% and no evidence of significant valvular heart disease  Review of Systems: Positive for: Nervousness anxiety Negative for: Vision change, hearing change, syncope, dizziness, nausea, vomiting,diarrhea, bloody stool, stomach pain, cough, congestion, diaphoresis, urinary frequency, urinary pain,skin lesions, skin rashes Others previously listed  Objective: Telemetry: Sinus rhythm Physical Exam: Blood pressure 115/74, pulse 99, temperature 98 F (36.7 C), resp. rate 18, height 5\' 5"  (1.651 m), weight 60 kg,  SpO2 95 %. Body mass index is 22.01 kg/m. General: Well developed, well nourished, in no acute distress. Head: Normocephalic, atraumatic, sclera non-icteric, no xanthomas, nares are without discharge. Neck: No apparent masses Lungs: Normal respirations with no wheezes, no rhonchi, no rales , no crackles   Heart: Regular rate and rhythm, normal S1 S2, no murmur, no rub, no gallop, PMI is normal size and placement, carotid upstroke normal without bruit, jugular venous pressure normal Abdomen: Soft, non-tender, non-distended with normoactive bowel sounds. No hepatosplenomegaly. Abdominal aorta is normal size without bruit Extremities: No edema, no clubbing, no cyanosis, no ulcers,  Peripheral: 2+ radial, 2+ femoral, 2+ dorsal pedal pulses Neuro: Alert and oriented. Moves all extremities spontaneously. Psych:  Responds to questions appropriately with a normal affect.   Intake/Output Summary (Last 24 hours) at 01/27/2020 1318 Last data filed at 01/27/2020 1031 Gross per 24 hour  Intake 240 ml  Output 300 ml  Net -60 ml    Inpatient Medications:  . aspirin EC  81 mg Oral Daily  . atorvastatin  40 mg Oral QHS  . Chlorhexidine Gluconate Cloth  6 each Topical Daily  . insulin aspart  0-5 Units Subcutaneous QHS  . insulin aspart  0-6 Units Subcutaneous TID WC  . insulin glargine  10 Units Subcutaneous Daily  . levETIRAcetam  750 mg Oral Q12H  . metoCLOPramide (REGLAN) injection  10 mg Intravenous TID AC   Or  . metoCLOPramide  10 mg Oral TID AC  . nicotine  21 mg Transdermal Daily  . pantoprazole (PROTONIX) IV  40 mg Intravenous Q12H  . polyethylene glycol  17 g Oral Daily  . potassium chloride  40 mEq Oral Q4H  . thiamine  100  mg Oral Daily   Infusions:  . heparin 700 Units/hr (01/27/20 1236)  . levETIRAcetam Stopped (01/26/20 1931)    Labs: Recent Labs    01/25/20 1332 01/25/20 1733 01/26/20 1231 01/27/20 0530  NA  --    < > 138 136  K  --    < > 3.1* 3.0*  CL  --    < >  102 101  CO2  --    < > 22 26  GLUCOSE  --    < > 144* 224*  BUN  --    < > 9 10  CREATININE  --    < > 0.63 0.62  CALCIUM  --    < > 9.2 9.0  MG 1.8  --   --  1.7  PHOS 3.8  --   --   --    < > = values in this interval not displayed.   Recent Labs    01/24/20 2140 01/25/20 0851  AST 14* 16  ALT 10 13  ALKPHOS 55 61  BILITOT 1.3* 1.6*  PROT 8.3* 9.3*  ALBUMIN 4.4 5.0   Recent Labs    01/26/20 1131 01/27/20 0530  WBC 15.3* 9.5  HGB 11.8* 11.5*  HCT 34.4* 35.1*  MCV 82.7 86.2  PLT 231 215   No results for input(s): CKTOTAL, CKMB, TROPONINI in the last 72 hours. Invalid input(s): POCBNP Recent Labs    01/25/20 1332  HGBA1C 11.8*     Weights: Filed Weights   01/25/20 0843 01/25/20 1800  Weight: 61.2 kg 60 kg     Radiology/Studies:  CT ABDOMEN PELVIS W CONTRAST  Result Date: 01/23/2020 CLINICAL DATA:  Abdominal pain and back pain. EXAM: CT ABDOMEN AND PELVIS WITH CONTRAST TECHNIQUE: Multidetector CT imaging of the abdomen and pelvis was performed using the standard protocol following bolus administration of intravenous contrast. CONTRAST:  134mL OMNIPAQUE IOHEXOL 300 MG/ML  SOLN COMPARISON:  February 22, 2017 FINDINGS: Lower chest: No acute abnormality. Hepatobiliary: No focal liver abnormality is seen. Status post cholecystectomy. No biliary dilatation. Pancreas: Unremarkable. No pancreatic ductal dilatation or surrounding inflammatory changes. Spleen: Normal in size without focal abnormality. Adrenals/Urinary Tract: The right adrenal gland is normal in size and appearance. A stable 1.2 cm low-attenuation left adrenal mass is seen. Kidneys are normal in size, without focal lesions. A 3 mm nonobstructing renal stone is seen within the mid left kidney. Bladder is unremarkable. Stomach/Bowel: Stomach is within normal limits. Appendix appears normal. No evidence of bowel wall thickening, distention, or inflammatory changes. Vascular/Lymphatic: There is moderate severity  calcification of the abdominal aorta and bilateral common iliac arteries, without evidence of aneurysmal dilatation. No enlarged abdominal or pelvic lymph nodes. Reproductive: The uterus is unremarkable. A 1.1 cm x 1.2 cm left adnexal cyst is seen. Other: No abdominal wall hernia or abnormality. No abdominopelvic ascites. Musculoskeletal: Mild subcutaneous inflammatory fat stranding is seen adjacent to the posterior aspect of the coccyx, to the left of midline. No acute or significant osseous findings. IMPRESSION: 1. 3 mm nonobstructing renal stone within the left kidney. 2. Small low-attenuation left adrenal mass which likely represents an adrenal adenoma. 3. 1.1 cm x 1.2 cm left adnexal cyst, likely ovarian in origin. 4. Aortic atherosclerosis. Aortic Atherosclerosis (ICD10-I70.0). Electronically Signed   By: Virgina Norfolk M.D.   On: 01/23/2020 21:43   ECHOCARDIOGRAM COMPLETE  Result Date: 01/26/2020    ECHOCARDIOGRAM REPORT   Patient Name:   Rachael Gray Date of Exam: 01/25/2020 Medical  Rec #:  [id number]          Height:       65.0 in Accession #:    [id number]         Weight:       132.3 lb Date of Birth:  10/28/1980          BSA:          1.659 m Patient Age:    39 years           BP:           123/63 mmHg Patient Gender: F                  HR:           112 bpm. Exam Location:  [facility] Procedure: 2D Echo, Cardiac Doppler and Color Doppler Indications:     Elevated Troponin  History:         Patient has no prior history of Echocardiogram examinations.                  Risk Factors:Diabetes. Lupus. Seizures.  Sonographer:     Wilford Sports Rodgers-Jones Referring Phys:  [place] Diagnosing Phys: Serafina Royals MD IMPRESSIONS  1. Left ventricular ejection fraction, by estimation, is 60 to 65%. The left ventricle has normal function. The left ventricle has no regional wall motion abnormalities. Left ventricular diastolic parameters were normal.  2. Right ventricular systolic function is normal. The  right ventricular size is normal.  3. The mitral valve is normal in structure. Trivial mitral valve regurgitation.  4. The aortic valve is normal in structure. Aortic valve regurgitation is not visualized. FINDINGS  Left Ventricle: Left ventricular ejection fraction, by estimation, is 60 to 65%. The left ventricle has normal function. The left ventricle has no regional wall motion abnormalities. The left ventricular internal cavity size was normal in size. There is  no left ventricular hypertrophy. Left ventricular diastolic parameters were normal. Right Ventricle: The right ventricular size is normal. No increase in right ventricular wall thickness. Right ventricular systolic function is normal. Left Atrium: Left atrial size was normal in size. Right Atrium: Right atrial size was normal in size. Pericardium: There is no evidence of pericardial effusion. Mitral Valve: The mitral valve is normal in structure. Trivial mitral valve regurgitation. Tricuspid Valve: The tricuspid valve is normal in structure. Tricuspid valve regurgitation is trivial. Aortic Valve: The aortic valve is normal in structure. Aortic valve regurgitation is not visualized. Pulmonic Valve: The pulmonic valve was normal in structure. Pulmonic valve regurgitation is not visualized. Aorta: The aortic root and ascending aorta are structurally normal, with no evidence of dilitation. IAS/Shunts: No atrial level shunt detected by color flow Doppler.  LEFT VENTRICLE PLAX 2D LVIDd:         4.39 cm  Diastology LVIDs:         2.55 cm  LV e' medial:    9.68 cm/s LV PW:         0.83 cm  LV E/e' medial:  10.5 LV IVS:        0.70 cm  LV e' lateral:   13.10 cm/s LVOT diam:     1.90 cm  LV E/e' lateral: 7.8 LV SV:         62 LV SV Index:   37 LVOT Area:     2.84 cm  RIGHT VENTRICLE             IVC RV  Basal diam:  3.01 cm     IVC diam: 1.54 cm RV S prime:     22.70 cm/s TAPSE (M-mode): 2.0 cm LEFT ATRIUM             Index       RIGHT ATRIUM          Index LA  diam:        3.50 cm 2.11 cm/m  RA Area:     9.90 cm LA Vol (A2C):   39.5 ml 23.80 ml/m RA Volume:   22.10 ml 13.32 ml/m LA Vol (A4C):   28.8 ml 17.36 ml/m LA Biplane Vol: 36.2 ml 21.82 ml/m  AORTIC VALVE LVOT Vmax:   121.00 cm/s LVOT Vmean:  94.100 cm/s LVOT VTI:    0.218 m  AORTA Ao Root diam: 2.90 cm Ao Asc diam:  2.90 cm MITRAL VALVE MV Area (PHT): 4.21 cm     SHUNTS MV Decel Time: 180 msec     Systemic VTI:  0.22 m MV E velocity: 102.00 cm/s  Systemic Diam: 1.90 cm MV A velocity: 119.00 cm/s MV E/A ratio:  0.86 Serafina Royals MD Electronically signed by Serafina Royals MD Signature Date/Time: 01/26/2020/7:13:35 AM    Final      Assessment and Recommendation  39 y.o. female with known diabetes hypertension hyperlipidemia with recent history of pancreatitis now admitted for abdominal discomfort and DKA with possible sepsis and elevated white blood cell count now with elevated troponin but no evidence of significant EKG changes or anginal symptoms at this time more consistent with demand ischemia rather than acute coronary syndrome 1.  Continue supportive care and treatment of DKA abdominal discomfort with and without previous pancreatitis and possible sepsis which appears to be somewhat improved 2.  No further cardiac intervention at this time due to no changes in EKG with no evidence of anginal symptoms despite elevation of troponin 3.  Treatment for demand ischemia including an single antiplatelet therapy including aspirin 81 mg atorvastatin 40 mg and now addition of a low-dose beta-blocker 4.  Begin ambulation and follow for improvements of symptoms and possible need for further intervention if patient has true anginal symptoms 5.  Dr. Saralyn Pilar to be on call and coverage for the weekend  Signed, Serafina Royals M.D. FACC

## 2020-01-27 NOTE — Progress Notes (Signed)
Inpatient Diabetes Program Recommendations  AACE/ADA: New Consensus Statement on Inpatient Glycemic Control   Target Ranges:  Prepandial:   less than 140 mg/dL      Peak postprandial:   less than 180 mg/dL (1-2 hours)      Critically ill patients:  140 - 180 mg/dL   Results for Rachael Gray, Rachael Gray (MRN [id number]) as of 01/27/2020 08:13  Ref. Range 01/26/2020 07:28 01/26/2020 08:31 01/26/2020 10:16 01/26/2020 11:19 01/26/2020 17:20 01/26/2020 19:59 01/26/2020 23:11 01/27/2020 04:28 01/27/2020 07:34  Glucose-Capillary Latest Ref Range: 70 - 99 mg/dL 152 (H) 163 (H)     Lantus 6 units 139 (H) 257 (H)  Novolog 5 units 154 (H) 110 (H) 211 (H)  Novolog 3 units 139 (H)   Review of Glycemic Control  Diabetes history: DM2 Outpatient Diabetes medications: 70/30 25 units QAM, 70/30 15 units QPM (Not taken any 70/30 in over a year) Current orders for Inpatient glycemic control: Lantus 15 units daily, Novolog 0-9 units Q4H  Inpatient Diabetes Program Recommendations:    Insulin: Please consider decreasing Lantus to 10 units daily and changing CBGs to AC&HS and Novolog correction to Novolog 0-6 units TID with meals and Novolog 0-5 units QHS.  HbgA1C:  A1C 11.8% on 01/25/20 indicating an average glucose of 292 mg/dl over the past 2-3 months. At time of discharge, please provide Rx for: 70/30 insulin vials and insulin syringes.  Thanks, Barnie Alderman, RN, MSN, CDE Diabetes Coordinator Inpatient Diabetes Program [phone] (Team Pager from 8am to 5pm)

## 2020-01-28 LAB — BASIC METABOLIC PANEL
Anion gap: 13 (ref 5–15)
BUN: 9 mg/dL (ref 6–20)
CO2: 26 mmol/L (ref 22–32)
Calcium: 9.4 mg/dL (ref 8.9–10.3)
Chloride: 93 mmol/L — ABNORMAL LOW (ref 98–111)
Creatinine, Ser: 0.57 mg/dL (ref 0.44–1.00)
GFR, Estimated: >60 mL/min (ref >60)
Glucose, Bld: 203 mg/dL — ABNORMAL HIGH (ref 70–99)
Potassium: 3.1 mmol/L — ABNORMAL LOW (ref 3.5–5.1)
Sodium: 132 mmol/L — ABNORMAL LOW (ref 135–145)

## 2020-01-28 LAB — HEPARIN LEVEL (UNFRACTIONATED)
Heparin Unfractionated: 0.14 IU/mL — ABNORMAL LOW (ref 0.30–0.70)
Heparin Unfractionated: 0.53 IU/mL (ref 0.30–0.70)

## 2020-01-28 LAB — GLUCOSE, CAPILLARY
Glucose-Capillary: 179 mg/dL — ABNORMAL HIGH (ref 70–99)
Glucose-Capillary: 96 mg/dL (ref 70–99)

## 2020-01-28 MED ORDER — POTASSIUM CHLORIDE 20 MEQ PO PACK: 40.0000 meq | PACK | ORAL | Status: DC

## 2020-01-28 MED ORDER — POTASSIUM CHLORIDE 20 MEQ PO PACK
40.0000 meq | PACK | ORAL | Status: DC
  Administered 2020-01-28 (×2): 40 meq via ORAL
  Filled 2020-01-28 (×2): qty 2

## 2020-01-28 MED ORDER — HEPARIN BOLUS VIA INFUSION
1800.0000 [IU] | Freq: Once | INTRAVENOUS | Status: AC
  Administered 2020-01-28: 01:00:00 1800 [IU] via INTRAVENOUS
  Filled 2020-01-28: qty 1800

## 2020-01-28 MED ORDER — POTASSIUM CHLORIDE ER 20 MEQ PO TBCR: 40.0000 meq | EXTENDED_RELEASE_TABLET | Freq: Two times a day (BID) | ORAL | 0 refills | Status: DC

## 2020-01-28 MED ORDER — MAGNESIUM SULFATE 2 GM/50ML IV SOLN: 2.0000 g | INTRAVENOUS | Status: DC

## 2020-01-28 MED ORDER — ALPRAZOLAM 0.5 MG PO TABS: 0.5000 mg | ORAL_TABLET | Freq: Three times a day (TID) | ORAL | 0 refills | Status: AC | PRN

## 2020-01-28 MED ORDER — ASPIRIN 81 MG PO TBEC
81.0000 mg | DELAYED_RELEASE_TABLET | Freq: Every day | ORAL | 2 refills | Status: DC
Start: 2020-01-29 — End: 2020-06-30

## 2020-01-28 MED ORDER — METOPROLOL TARTRATE 25 MG PO TABS
12.5000 mg | ORAL_TABLET | Freq: Two times a day (BID) | ORAL | 2 refills | Status: DC
Start: 2020-01-28 — End: 2020-06-30

## 2020-01-28 MED ORDER — METHOCARBAMOL 500 MG PO TABS: 500.0000 mg | ORAL_TABLET | Freq: Three times a day (TID) | ORAL | 1 refills | Status: DC | PRN

## 2020-01-28 MED ORDER — ATORVASTATIN CALCIUM 40 MG PO TABS
40.0000 mg | ORAL_TABLET | Freq: Every day | ORAL | 2 refills | Status: DC
Start: 2020-01-28 — End: 2020-06-30

## 2020-01-28 MED ORDER — NOVOLIN 70/30 (70-30) 100 UNIT/ML ~~LOC~~ SUSP: SUBCUTANEOUS | 3 refills | Status: DC

## 2020-01-28 MED ORDER — PANTOPRAZOLE SODIUM 40 MG PO TBEC
40.0000 mg | DELAYED_RELEASE_TABLET | Freq: Two times a day (BID) | ORAL | 1 refills | Status: DC
Start: 2020-01-28 — End: 2020-06-30

## 2020-01-28 NOTE — Progress Notes (Signed)
[facility] Cardiology    SUBJECTIVE: The patient denies chest pain at this time. She had chest discomfort last night that came and went without seconds. She denies shortness of breath or feeling fluid-overloaded.   Vitals:   01/27/20 2126 01/28/20 0011 01/28/20 0421 01/28/20 0743  BP: (!) 152/76 (!) 144/97 (!) 149/90 122/73  Pulse: (!) 106 (!) 107 97 87  Resp: (!) 22 20  16   Temp: 99.3 F (37.4 C) 98.9 F (37.2 C) 99.2 F (37.3 C) 98.5 F (36.9 C)  TempSrc: Oral Oral Oral Oral  SpO2: 95% 100% 100% 99%  Weight:      Height:         Intake/Output Summary (Last 24 hours) at 01/28/2020 1018 Last data filed at 01/28/2020 0500 Gross per 24 hour  Intake 363.53 ml  Output 601 ml  Net -237.47 ml      PHYSICAL EXAM  General: Well developed, well nourished, in no acute distress. Constant movements. HEENT:  Normocephalic and atramatic Neck:  No JVD.  Lungs: Clear bilaterally to auscultation, normal effort of breathing on room ari Heart: Tachycardic, regular rhythm . Normal S1 and S2 without gallops or murmurs.  Abdomen: nondistended Msk: no obvious deformities Extremities: No clubbing, cyanosis or edema.   Neuro: Alert and oriented X 3. Psych:  Good affect, responds appropriately   LABS: Basic Metabolic Panel: Recent Labs    01/25/20 1332 01/25/20 1733 01/27/20 0530 01/28/20 0657  NA  --    < > 136 132*  K  --    < > 3.0* 3.1*  CL  --    < > 101 93*  CO2  --    < > 26 26  GLUCOSE  --    < > 224* 203*  BUN  --    < > 10 9  CREATININE  --    < > 0.62 0.57  CALCIUM  --    < > 9.0 9.4  MG 1.8  --  1.7  --   PHOS 3.8  --   --   --    < > = values in this interval not displayed.   Liver Function Tests: No results for input(s): AST, ALT, ALKPHOS, BILITOT, PROT, ALBUMIN in the last 72 hours. Recent Labs    01/26/20 1231  LIPASE 28   CBC: Recent Labs    01/26/20 1131 01/27/20 0530  WBC 15.3* 9.5  HGB 11.8* 11.5*  HCT 34.4* 35.1*  MCV 82.7 86.2  PLT 231 215    Cardiac Enzymes: No results for input(s): CKTOTAL, CKMB, CKMBINDEX, TROPONINI in the last 72 hours. BNP: Invalid input(s): POCBNP D-Dimer: No results for input(s): DDIMER in the last 72 hours. Hemoglobin A1C: Recent Labs    01/25/20 1332  HGBA1C 11.8*   Fasting Lipid Panel: No results for input(s): CHOL, HDL, LDLCALC, TRIG, CHOLHDL, LDLDIRECT in the last 72 hours. Thyroid Function Tests: No results for input(s): TSH, T4TOTAL, T3FREE, THYROIDAB in the last 72 hours.  Invalid input(s): FREET3 Anemia Panel: No results for input(s): VITAMINB12, FOLATE, FERRITIN, TIBC, IRON, RETICCTPCT in the last 72 hours.  No results found.   Echo LVEF 60-65%. No wall motion abnormalities, trivial valvular insufficiencies.   ASSESSMENT AND PLAN:  Active Problems:   Nausea & vomiting   Duodenitis   DKA (diabetic ketoacidosis) (HCC)   Tobacco abuse disorder   SIRS due to non-infectious process without acute organ dysfunction ([facility])   Cannabis use disorder, moderate, dependence ([place])   DKA, type 2 (  [place])    1. Elevated troponin, likely secondary to demand supply ischemia in the setting of DKA and possible sepsis. ECG without ischemic changes. Troponin down-trending. Echocardiogram shows normal left ventricular function with no wall motion abnormalities. 2. Chest pain, likely noncardiac, brief in nature, without recurrence.  3. DKA 4. Possible sepsis  Recommendations: 1. Discontinue heparin drip 2. Recommend ischemic work-up as outpatient 3. Continue aspirin, statin, beta blocker 4. Defer further cardiac diagnostics at this time  Sign off for now; please call/haiku with any questions  Clabe Seal, PA-C 01/28/2020 10:18 AM

## 2020-01-28 NOTE — Progress Notes (Signed)
Pt AAox4, VS stable, Pt anxious and restless this morning in bed rocking/shaking back and forth. States " I am anxious". Pt given xanax with some relief for short period ambulating in hallway appeared to help as well. C/O of headache x 1 treated with tordol. Plan for pt to discharge home with family. Heparin drip stopped. IVs removed. Discharge instructions reviewed with patient and daughter at bedside. No questions or concerned. Daughter took pt bag and escorted pt to car as she declined wheelchair service.

## 2020-01-28 NOTE — Progress Notes (Signed)
   01/27/20 2126  Assess: MEWS Score  Temp 99.3 F (37.4 C)  BP (!) 152/76  Pulse Rate (!) 106  Resp (!) 22  Level of Consciousness Alert  SpO2 95 %  O2 Device Room Air  Patient Activity (if Appropriate) In bed  Assess: MEWS Score  MEWS Temp 0  MEWS Systolic 0  MEWS Pulse 1  MEWS RR 1  MEWS LOC 0  MEWS Score 2  MEWS Score Color Yellow  Assess: if the MEWS score is Yellow or Red  Were vital signs taken at a resting state? Yes  Focused Assessment No change from prior assessment  Early Detection of Sepsis Score *See Row Information* Low  MEWS guidelines implemented *See Row Information* Yes  Treat  MEWS Interventions Administered prn meds/treatments  Pain Scale 0-10  Pain Score 10  Pain Type Acute pain  Pain Location Abdomen  Take Vital Signs  Increase Vital Sign Frequency  Yellow: Q 2hr X 2 then Q 4hr X 2, if remains yellow, continue Q 4hrs  Escalate  MEWS: Escalate Yellow: discuss with charge nurse/RN and consider discussing with provider and RRT  Notify: Charge Nurse/RN  Name of Charge Nurse/RN Notified Jackie, RN  Date Charge Nurse/RN Notified 01/27/20  Time Charge Nurse/RN Notified 2143  Document  Patient Outcome Stabilized after interventions  Progress note created (see row info) Yes

## 2020-01-28 NOTE — Progress Notes (Signed)
Pt resting comfortably throughout night after 0015 VS obtained. No tremors while pt is asleep, pt remains undisturbed. RN used discretion not to wake pt up for VS throughout night, HR maintained 90's on telemetry while pt slept.

## 2020-01-28 NOTE — Progress Notes (Signed)
[place] [facility] for Heparin Infusion  Indication: chest pain/ACS  Allergies  Allergen Reactions  . Dilantin [Phenytoin Sodium Extended] Hives  . Doxycycline Swelling  . Other   . Morphine And Related Rash  . Tylenol [Acetaminophen] Rash    Patient Measurements: Height: 5\' 5"  (165.1 cm) Weight: 60 kg (132 lb 4.4 oz) IBW/kg (Calculated) : 57  Vital Signs: Temp: 98.5 F (36.9 C) (11/05 0743) Temp Source: Oral (11/05 0743) BP: 122/73 (11/05 0743) Pulse Rate: 87 (11/05 0743)  Labs: Recent Labs    01/25/20 0851 01/25/20 1315 01/26/20 0905 01/26/20 1131 01/26/20 1231 01/26/20 1231 01/26/20 2107 01/27/20 0530 01/27/20 0938 01/27/20 1119 01/27/20 1216 01/27/20 2333 01/28/20 0657  HGB 14.9  --   --  11.8*  --   --   --  11.5*  --   --   --   --   --   HCT 45.6  --   --  34.4*  --   --   --  35.1*  --   --   --   --   --   PLT 303  --   --  231  --   --   --  215  --   --   --   --   --   APTT  --   --   --   --   --   --   --   --   --   --  26  --   --   LABPROT  --   --   --   --   --   --   --   --   --   --  12.3  --   --   INR  --   --   --   --   --   --   --   --   --   --  1.0  --   --   HEPARINUNFRC  --   --   --   --   --   --   --   --   --   --   --  0.14* 0.53  CREATININE 0.89   < >   < >  --  0.63  --   --  0.62  --   --   --   --  0.57  TROPONINIHS  --    < >  --   --  2,922*   < > 2,358*  --  2,078* 1,656*  --   --   --    < > = values in this interval not displayed.    Estimated Creatinine Clearance: 85.8 mL/min (by C-G formula based on SCr of 0.57 mg/dL).   Medical History: Past Medical History:  Diagnosis Date  . Collagen vascular disease ([place])   . Diabetes mellitus without complication ([place])   . Gastroparesis   . Lupus ([place])   . Pseudoseizures ([place])   . Seizures [facility])      Assessment: 39 yo female admitted with N/V and DKA. Patient now complaining of chest pain. Pharmacy consulted for heparin infusion  dosing and monitoring. Patient has been receiving heparin 5000 units SubQ every 8 hours. Last doses received this morning @ 0453   Troponin HS 2358>>2078  11/5 : HL @ 2333 = 0.14, Will order Heparin 1800 units IV X 1 bolus and increase drip rate to 900 units/hr.  Goal of Therapy:  Heparin level 0.3-0.7 units/ml Monitor platelets by anticoagulation protocol: Yes   Plan:  11/5 : HL @ 0657 = 0.53 therapeutic. Will continue Heparin drip rate at 900 units/hr.  Will check confirmatory HL in 6 hrs. CBC daily.  Noralee Space, PharmD Clinical Pharmacist 01/28/2020 8:04 AM

## 2020-01-28 NOTE — Progress Notes (Signed)
ANTICOAGULATION CONSULT NOTE - Initial Consult  Pharmacy Consult for Heparin Infusion  Indication: chest pain/ACS  Allergies  Allergen Reactions   Dilantin [Phenytoin Sodium Extended] Hives   Doxycycline Swelling   Other    Morphine And Related Rash   Tylenol [Acetaminophen] Rash    Patient Measurements: Height: 5\' 5"  (165.1 cm) Weight: 60 kg (132 lb 4.4 oz) IBW/kg (Calculated) : 57  Vital Signs: Temp: 98.9 F (37.2 C) (11/05 0011) Temp Source: Oral (11/05 0011) BP: 144/97 (11/05 0011) Pulse Rate: 107 (11/05 0011)  Labs: Recent Labs    01/25/20 0851 01/25/20 0851 01/25/20 1315 01/25/20 1733 01/26/20 0905 01/26/20 1131 01/26/20 1231 01/26/20 1231 01/26/20 2107 01/27/20 0530 01/27/20 0938 01/27/20 1119 01/27/20 1216 01/27/20 2333  HGB 14.9   < >  --   --   --  11.8*  --   --   --  11.5*  --   --   --   --   HCT 45.6  --   --   --   --  34.4*  --   --   --  35.1*  --   --   --   --   PLT 303  --   --   --   --  231  --   --   --  215  --   --   --   --   APTT  --   --   --   --   --   --   --   --   --   --   --   --  26  --   LABPROT  --   --   --   --   --   --   --   --   --   --   --   --  12.3  --   INR  --   --   --   --   --   --   --   --   --   --   --   --  1.0  --   HEPARINUNFRC  --   --   --   --   --   --   --   --   --   --   --   --   --  0.14*  CREATININE 0.89  --    < >  --  0.66  --  0.63  --   --  0.62  --   --   --   --   TROPONINIHS  --   --    < >   < >  --   --  2,922*   < > 2,358*  --  2,078* 1,656*  --   --    < > = values in this interval not displayed.    Estimated Creatinine Clearance: 85.8 mL/min (by C-G formula based on SCr of 0.62 mg/dL).   Medical History: Past Medical History:  Diagnosis Date   Collagen vascular disease ([place])    Diabetes mellitus without complication ([place])    Gastroparesis    Lupus ([place])    Pseudoseizures ([place])    Seizures ([place])      Assessment: 39 yo female admitted with N/V and DKA.  Patient now complaining of chest pain. Pharmacy consulted for heparin infusion dosing and monitoring. Patient has been receiving heparin 5000 units SubQ every 8 hours. Last doses received  this morning @ 0453   Troponin HS 2358>>2078  Goal of Therapy:  Heparin level 0.3-0.7 units/ml Monitor platelets by anticoagulation protocol: Yes   Plan:  Patient received heparin subQ @ 0453 this morning.  Will give reduced bolus bolus of 2000 x 1 Start heparin infusion at 700 units/hr Check anti-Xa level in 6 hours and daily while on heparin Continue to monitor H&H and platelets  11/5 : HL @ 2333 = 0.14 Will order Heparin 1800 units IV X 1 bolus and increase drip rate to 900 units/hr.  Will recheck HL 6 hrs after rate change.   Orene Desanctis, PharmD Clinical Pharmacist 01/28/2020 1:04 AM

## 2020-01-28 NOTE — Discharge Summary (Signed)
Physician Discharge Summary  Rachael Gray EUM:[id number] DOB: 07/28/1980 DOA: 01/25/2020  PCP: Patient, No Pcp Per  Admit date: 01/25/2020 Discharge date: 01/28/2020  Admitted From: home Disposition:  home  Recommendations for Outpatient Follow-up:  1. Follow up with PCP in 1-2 weeks 2. Please obtain BMP/CBC WITHIN in one week. 3. Patient discharged on oral potassium supplementation, please repeat labs and re-assess need. 4. Please follow up on patient's insulin regimen and glycemic control.  Insulin requirements at time of discharge are significantly lower than her prior prescription.  Expect dosing will need to be increased in short follow up once patient returns to usual dietary habits.   5. Patient was started on ASA, statin and beta blocker by cardiology due to chest pain and elevated troponin which cardiology did not feel was due to ACS but demand ischemia.  She should follow up with cardiology for further evaluation as outpatient.   Home Health: no  Equipment/Devices: none  Discharge Condition: stable  CODE STATUS: full  Diet recommendation: Heart Healthy / Carb Modified   Discharge Diagnoses: Active Problems:   Nausea & vomiting   Duodenitis   DKA (diabetic ketoacidosis) (HCC)   Tobacco abuse disorder   SIRS due to non-infectious process without acute organ dysfunction (HCC)   Cannabis use disorder, moderate, dependence ([place])   DKA, type 2 ([place])    Summary of HPI and Hospital Course:  Rachael Gray is a 39 y.o. female with medical history significant of DM, seizures, Lupus, gastroparesis, collagen vascular disease, presented again to the ED on evening of 01/24/20 for abdominal pain.  She was seen in the ED on 10/31 for abdominal pain and vomiting, diagnosed with acute pancreatitis with lipase of 52.  CT abdomen/pelvis however was negative for pertinent acute findings.  Her pain improved with IV dilaudid and she was discharged with prescription for Vicodin.  Patient  returned to the ED reporting persistent abdominal pain, nausea and vomiting.  Found to be in DKA.  Admitted to ICU on insulin drip.   Cardiology was consulted due to elevated troponin which was attributed to demand ischemia.  11/3 - DKA resolved, off insuling drip this morning and transitioned to subcutaneous insulin.  Transferred to med/surg.  Ongoing abdominal pain.  Repeat troponin significantly elevated over 2900, Dr. Nehemiah Massed notified, recommended continuing current medical therapy, does not feel presentation consistent with ACS.  Further cardiology evaluation deferred until DKA resolves.     ASA, statin, BB - cards f/u Anxiety - few days xanax, pcp follow up HypoK - sent on oral K     DKA - POA, resolved.  Treated with insulin drip.  Transitioned to Lantus with sliding scale Novolog AC/HS.  Hypoglycemia protocol. Insulin-dependent type 2 diabetes - mgmt as above.  Hbg A1c is 11.8% uncontrolled. --Diabetes coordinator followed patient --Transitioned to NPH insulin, 10 units BID at time of discharge.  --Close follow up for adjusting insulin as current requirement much lower than usual  Gastroparesis - Symptoms improved with scheduled IV Reglan TID before meals and glycemic control.   Transition to oral Reglan, recommend continuing it scheduled before meals for symptom control.   Abdominal pain / Nausea / Vomiting - likely due to exacerbated gastroparesis in the setting of DKA and known duodenitis.  CT abdomen/pelvis showed nothing acute.  Lactic acid today normal x 2 makes mesenteric ischemia unlikely, but in differential given her vascular disease.  Diagnosed with pancreatitis in ED day before based on lipase 52, yesterday lipase normal at 28,  no findings on CT consistent with pancreatitis.   --Treated with schedule Reglan as above --Trial of simethicone --Bowel regimen --PRN norco - caveat: need to limit use of narcotics as these will slow GI tract and worsen gastroparesis    Hypokalemia - K 3.1, replaced.  BMP and Mg at follow up.  Sepsis - Ruled Out.  Met SIRS criteria on admission with leukocytosis and tachycardia and had lactic acidosis 2.4, in setting of DKA.  No source of infection was identified.  Negative for influenza and Covid-19.  CT abdomen/pelvis had no acute intraabdominal findings.  UA was negative in the ED.  Urine culture obtained grew multiple species, likely contaminated.   Pt has no dysuria or respiratory complaints.   Monitor clinically for s/sx's of infection.    Duodenitis - resume oral Protonix BID.  Outpatient GI follow up.  Chest pain - on rounds AM of 11/4 pt complaining of CP, it was relieved by sublingual nitroglycerin.  Mgmt as below.  Elevated troponin - initially without chest pain which she later developed on 11/4.  Demand ischemia most likely vs ?NSTEMI. Troponin peaked at 2922 on 11/3, repeat in setting of active chest pain today 2078 >>1656  --D dimer was normal, Echo normal, making PE very unlikely.  --Cardiology was consulted and considered this to be secondary to demand ischemia.   --per cardiology, continue medical management with ASA, statin, beta blocker and outpatient follow up for ischemic evaluation  Tobacco abuse / Cannabis abuse - daily use of marijuana.  Counseled on cessation of smoking and use of cannabis.   Doubt current presentation related to cannabinoid hyperemesis syndrome because cessation of use typically improves symptoms.  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 --Resume PO Keppra since tolerating oral intake     Discharge Instructions   Discharge Instructions    Call MD for:   Complete by: As directed    Uncontrolled blood sugars (lows under 80 or highs above 200)   Call MD for:  extreme fatigue   Complete by: As directed    Call MD for:  persistant dizziness or light-headedness   Complete by: As directed    Call MD for:  persistant nausea and vomiting   Complete by: As  directed    Call MD for:  severe uncontrolled pain   Complete by: As directed    Call MD for:  temperature >100.4   Complete by: As directed    Diet - low sodium heart healthy   Complete by: As directed    Discharge instructions   Complete by: As directed    It is VERY important that you get a follow up appointment with Primary Care within 1 week (2 weeks at most), to follow up on how much insulin you need.  You currently are needing quite a bit less insulin than previously.  I'm sending a prescription for NPH 70/30, same insulin you've been using, but only 10 units twice daily with meals. When you get back to eating your normal diet as you feel better, you will probably need your insulin increased again.  Please make sure to write down your blood sugars at home and bring to your doctor appointments so they can adjust your dosing.   Increase activity slowly   Complete by: As directed      Allergies as of 01/28/2020      Reactions   Dilantin [phenytoin Sodium Extended] Hives   Doxycycline Swelling   Other    Morphine And  Related Rash   Tylenol [acetaminophen] Rash      Medication List    STOP taking these medications   insulin aspart 100 UNIT/ML injection Commonly known as: novoLOG     TAKE these medications   ALPRAZolam 0.5 MG tablet Commonly known as: XANAX Take 1 tablet (0.5 mg total) by mouth 3 (three) times daily as needed for up to 5 days for anxiety.   aspirin 81 MG EC tablet Take 1 tablet (81 mg total) by mouth daily. Swallow whole. Start taking on: January 29, 2020   atorvastatin 40 MG tablet Commonly known as: LIPITOR Take 1 tablet (40 mg total) by mouth at bedtime.   hydrocodone-ibuprofen 5-200 MG tablet Commonly known as: VICOPROFEN Take 1 tablet by mouth every 8 (eight) hours as needed for pain.   levETIRAcetam 750 MG tablet Commonly known as: KEPPRA Take 1 tablet (750 mg total) by mouth 2 (two) times daily.   methocarbamol 500 MG tablet Commonly  known as: ROBAXIN Take 1 tablet (500 mg total) by mouth every 8 (eight) hours as needed for muscle spasms.   metoCLOPramide 10 MG tablet Commonly known as: REGLAN Take 1 tablet (10 mg total) by mouth every 8 (eight) hours as needed.   metoprolol tartrate 25 MG tablet Commonly known as: LOPRESSOR Take 0.5 tablets (12.5 mg total) by mouth 2 (two) times daily.   NovoLIN 70/30 (70-30) 100 UNIT/ML injection Generic drug: insulin NPH-regular Human Inject 10 units under the skin twice daily with breakfast and dinner What changed:   how much to take  how to take this  when to take this  additional instructions   ondansetron 4 MG disintegrating tablet Commonly known as: Zofran ODT Take 1 tablet (4 mg total) by mouth every 8 (eight) hours as needed for nausea or vomiting.   pantoprazole 40 MG tablet Commonly known as: PROTONIX Take 1 tablet (40 mg total) by mouth 2 (two) times daily.   Potassium Chloride ER 20 MEQ Tbcr Take 40 mEq by mouth 2 (two) times daily for 10 days. What changed:   medication strength  how much to take  when to take this       Allergies  Allergen Reactions  . Dilantin [Phenytoin Sodium Extended] Hives  . Doxycycline Swelling  . Other   . Morphine And Related Rash  . Tylenol [Acetaminophen] Rash    Consultations:  Cardiology    Procedures/Studies: CT ABDOMEN PELVIS W CONTRAST  Result Date: 01/23/2020 CLINICAL DATA:  Abdominal pain and back pain. EXAM: CT ABDOMEN AND PELVIS WITH CONTRAST TECHNIQUE: Multidetector CT imaging of the abdomen and pelvis was performed using the standard protocol following bolus administration of intravenous contrast. CONTRAST:  149m OMNIPAQUE IOHEXOL 300 MG/ML  SOLN COMPARISON:  February 22, 2017 FINDINGS: Lower chest: No acute abnormality. Hepatobiliary: No focal liver abnormality is seen. Status post cholecystectomy. No biliary dilatation. Pancreas: Unremarkable. No pancreatic ductal dilatation or surrounding  inflammatory changes. Spleen: Normal in size without focal abnormality. Adrenals/Urinary Tract: The right adrenal gland is normal in size and appearance. A stable 1.2 cm low-attenuation left adrenal mass is seen. Kidneys are normal in size, without focal lesions. A 3 mm nonobstructing renal stone is seen within the mid left kidney. Bladder is unremarkable. Stomach/Bowel: Stomach is within normal limits. Appendix appears normal. No evidence of bowel wall thickening, distention, or inflammatory changes. Vascular/Lymphatic: There is moderate severity calcification of the abdominal aorta and bilateral common iliac arteries, without evidence of aneurysmal dilatation. No enlarged abdominal or pelvic  lymph nodes. Reproductive: The uterus is unremarkable. A 1.1 cm x 1.2 cm left adnexal cyst is seen. Other: No abdominal wall hernia or abnormality. No abdominopelvic ascites. Musculoskeletal: Mild subcutaneous inflammatory fat stranding is seen adjacent to the posterior aspect of the coccyx, to the left of midline. No acute or significant osseous findings. IMPRESSION: 1. 3 mm nonobstructing renal stone within the left kidney. 2. Small low-attenuation left adrenal mass which likely represents an adrenal adenoma. 3. 1.1 cm x 1.2 cm left adnexal cyst, likely ovarian in origin. 4. Aortic atherosclerosis. Aortic Atherosclerosis (ICD10-I70.0). Electronically Signed   By: Virgina Norfolk M.D.   On: 01/23/2020 21:43   ECHOCARDIOGRAM COMPLETE  Result Date: 01/26/2020    ECHOCARDIOGRAM REPORT   Patient Name:   Rachael Gray Date of Exam: 01/25/2020 Medical Rec #:  [id number]          Height:       65.0 in Accession #:    [id number]         Weight:       132.3 lb Date of Birth:  [birth year]          BSA:          1.659 m Patient Age:    29 years           BP:           123/63 mmHg Patient Gender: F                  HR:           112 bpm. Exam Location:  [facility] Procedure: 2D Echo, Cardiac Doppler and Color Doppler Indications:      Elevated Troponin  History:         Patient has no prior history of Echocardiogram examinations.                  Risk Factors:Diabetes. Lupus. Seizures.  Sonographer:     Wilford Sports Rodgers-Jones Referring Phys:  [place] Diagnosing Phys: Serafina Royals MD IMPRESSIONS  1. Left ventricular ejection fraction, by estimation, is 60 to 65%. The left ventricle has normal function. The left ventricle has no regional wall motion abnormalities. Left ventricular diastolic parameters were normal.  2. Right ventricular systolic function is normal. The right ventricular size is normal.  3. The mitral valve is normal in structure. Trivial mitral valve regurgitation.  4. The aortic valve is normal in structure. Aortic valve regurgitation is not visualized. FINDINGS  Left Ventricle: Left ventricular ejection fraction, by estimation, is 60 to 65%. The left ventricle has normal function. The left ventricle has no regional wall motion abnormalities. The left ventricular internal cavity size was normal in size. There is  no left ventricular hypertrophy. Left ventricular diastolic parameters were normal. Right Ventricle: The right ventricular size is normal. No increase in right ventricular wall thickness. Right ventricular systolic function is normal. Left Atrium: Left atrial size was normal in size. Right Atrium: Right atrial size was normal in size. Pericardium: There is no evidence of pericardial effusion. Mitral Valve: The mitral valve is normal in structure. Trivial mitral valve regurgitation. Tricuspid Valve: The tricuspid valve is normal in structure. Tricuspid valve regurgitation is trivial. Aortic Valve: The aortic valve is normal in structure. Aortic valve regurgitation is not visualized. Pulmonic Valve: The pulmonic valve was normal in structure. Pulmonic valve regurgitation is not visualized. Aorta: The aortic root and ascending aorta are structurally normal, with no evidence of dilitation. IAS/Shunts: No  atrial  level shunt detected by color flow Doppler.  LEFT VENTRICLE PLAX 2D LVIDd:         4.39 cm  Diastology LVIDs:         2.55 cm  LV e' medial:    9.68 cm/s LV PW:         0.83 cm  LV E/e' medial:  10.5 LV IVS:        0.70 cm  LV e' lateral:   13.10 cm/s LVOT diam:     1.90 cm  LV E/e' lateral: 7.8 LV SV:         62 LV SV Index:   37 LVOT Area:     2.84 cm  RIGHT VENTRICLE             IVC RV Basal diam:  3.01 cm     IVC diam: 1.54 cm RV S prime:     22.70 cm/s TAPSE (M-mode): 2.0 cm LEFT ATRIUM             Index       RIGHT ATRIUM          Index LA diam:        3.50 cm 2.11 cm/m  RA Area:     9.90 cm LA Vol (A2C):   39.5 ml 23.80 ml/m RA Volume:   22.10 ml 13.32 ml/m LA Vol (A4C):   28.8 ml 17.36 ml/m LA Biplane Vol: 36.2 ml 21.82 ml/m  AORTIC VALVE LVOT Vmax:   121.00 cm/s LVOT Vmean:  94.100 cm/s LVOT VTI:    0.218 m  AORTA Ao Root diam: 2.90 cm Ao Asc diam:  2.90 cm MITRAL VALVE MV Area (PHT): 4.21 cm     SHUNTS MV Decel Time: 180 msec     Systemic VTI:  0.22 m MV E velocity: 102.00 cm/s  Systemic Diam: 1.90 cm MV A velocity: 119.00 cm/s MV E/A ratio:  0.86 Serafina Royals MD Electronically signed by Serafina Royals MD Signature Date/Time: 01/26/2020/7:13:35 AM    Final        Subjective: Pt seen this AM. Abdominal pain getting better.  Tolerating diet.  Chest pain resolved.  No acute complaints.    Discharge Exam: Vitals:   01/28/20 0743 01/28/20 1220  BP: 122/73 113/84  Pulse: 87 98  Resp: 16 18  Temp: 98.5 F (36.9 C) 100 F (37.8 C)  SpO2: 99% 100%   Vitals:   01/28/20 0011 01/28/20 0421 01/28/20 0743 01/28/20 1220  BP: (!) 144/97 (!) 149/90 122/73 113/84  Pulse: (!) 107 97 87 98  Resp: _0 Temp: 98.9 F (37.2 C) 99.2 F (37.3 C) 98.5 F (36.9 C) 100 F (37.8 C)  TempSrc: Oral Oral Oral Oral  SpO2: 100% 100% 99% 100%  Weight:      Height:        General: Pt is alert, awake, not in acute distress Cardiovascular: RRR, S1/S2 +, no rubs, no gallops Respiratory: CTA  bilaterally, no wheezing, no rhonchi Abdominal: Soft, NT, ND, bowel sounds + Extremities: no edema, no cyanosis    The results of significant diagnostics from this hospitalization (including imaging, microbiology, ancillary and laboratory) are listed below for reference.     Microbiology: Recent Results (from the past 240 hour(s))  Respiratory Panel by RT PCR (Flu A&B, Covid) - Nasopharyngeal Swab     Status: None   Collection Time: 01/23/20 10:24 PM   Specimen: Nasopharyngeal Swab  Result Value Ref Range  Status   SARS Coronavirus 2 by RT PCR NEGATIVE NEGATIVE Final    Comment: (NOTE) SARS-CoV-2 target nucleic acids are NOT DETECTED.  The SARS-CoV-2 RNA is generally detectable in upper respiratoy specimens during the acute phase of infection. The lowest concentration of SARS-CoV-2 viral copies this assay can detect is 131 copies/mL. A negative result does not preclude SARS-Cov-2 infection and should not be used as the sole basis for treatment or other patient management decisions. A negative result may occur with  improper specimen collection/handling, submission of specimen other than nasopharyngeal swab, presence of viral mutation(s) within the areas targeted by this assay, and inadequate number of viral copies (<131 copies/mL). A negative result must be combined with clinical observations, patient history, and epidemiological information. The expected result is Negative.  Fact Sheet for Patients:  PinkCheek.be  Fact Sheet for Healthcare Providers:  GravelBags.it  This test is no t yet approved or cleared by the [place] FDA and  has been authorized for detection and/or diagnosis of SARS-CoV-2 by FDA under an Emergency Use Authorization (EUA). This EUA will remain  in effect (meaning this test can be used) for the duration of the COVID-19 declaration under Section 564(b)(1) of the Act, 21 U.S.C. section  360bbb-3(b)(1), unless the authorization is terminated or revoked sooner.     Influenza A by PCR NEGATIVE NEGATIVE Final   Influenza B by PCR NEGATIVE NEGATIVE Final    Comment: (NOTE) The Xpert Xpress SARS-CoV-2/FLU/RSV assay is intended as an aid in  the diagnosis of influenza from Nasopharyngeal swab specimens and  should not be used as a sole basis for treatment. Nasal washings and  aspirates are unacceptable for Xpert Xpress SARS-CoV-2/FLU/RSV  testing.  Fact Sheet for Patients: PinkCheek.be  Fact Sheet for Healthcare Providers: GravelBags.it  This test is not yet approved or cleared by the [place] FDA and  has been authorized for detection and/or diagnosis of SARS-CoV-2 by  FDA under an Emergency Use Authorization (EUA). This EUA will remain  in effect (meaning this test can be used) for the duration of the  Covid-19 declaration under Section 564(b)(1) of the Act, 21  U.S.C. section 360bbb-3(b)(1), unless the authorization is  terminated or revoked. Performed at [facility], [address], [place], [place] [address]   Urine Culture     Status: Abnormal   Collection Time: 01/25/20  8:51 AM   Specimen: Urine, Random  Result Value Ref Range Status   Specimen Description   Final    URINE, RANDOM Performed at [facility], [address], [place], [place] [address]    Special Requests   Final    NONE Performed at [facility], [address] [place], [place] [address]    Culture MULTIPLE SPECIES PRESENT, SUGGEST RECOLLECTION (A)  Final   Report Status 01/26/2020 FINAL  Final  Respiratory Panel by RT PCR (Flu A&B, Covid) - Nasopharyngeal Swab     Status: None   Collection Time: 01/25/20 10:06 AM   Specimen: Nasopharyngeal Swab  Result Value Ref Range Status   SARS Coronavirus 2 by RT PCR NEGATIVE NEGATIVE Final    Comment: (NOTE) SARS-CoV-2 target nucleic acids are NOT  DETECTED.  The SARS-CoV-2 RNA is generally detectable in upper respiratoy specimens during the acute phase of infection. The lowest concentration of SARS-CoV-2 viral copies this assay can detect is 131 copies/mL. A negative result does not preclude SARS-Cov-2 infection and should not be used as the sole basis for treatment or other  patient management decisions. A negative result may occur with  improper specimen collection/handling, submission of specimen other than nasopharyngeal swab, presence of viral mutation(s) within the areas targeted by this assay, and inadequate number of viral copies (<131 copies/mL). A negative result must be combined with clinical observations, patient history, and epidemiological information. The expected result is Negative.  Fact Sheet for Patients:  PinkCheek.be  Fact Sheet for Healthcare Providers:  GravelBags.it  This test is no t yet approved or cleared by the [place] FDA and  has been authorized for detection and/or diagnosis of SARS-CoV-2 by FDA under an Emergency Use Authorization (EUA). This EUA will remain  in effect (meaning this test can be used) for the duration of the COVID-19 declaration under Section 564(b)(1) of the Act, 21 U.S.C. section 360bbb-3(b)(1), unless the authorization is terminated or revoked sooner.     Influenza A by PCR NEGATIVE NEGATIVE Final   Influenza B by PCR NEGATIVE NEGATIVE Final    Comment: (NOTE) The Xpert Xpress SARS-CoV-2/FLU/RSV assay is intended as an aid in  the diagnosis of influenza from Nasopharyngeal swab specimens and  should not be used as a sole basis for treatment. Nasal washings and  aspirates are unacceptable for Xpert Xpress SARS-CoV-2/FLU/RSV  testing.  Fact Sheet for Patients: PinkCheek.be  Fact Sheet for Healthcare Providers: GravelBags.it  This test is not yet  approved or cleared by the [place] FDA and  has been authorized for detection and/or diagnosis of SARS-CoV-2 by  FDA under an Emergency Use Authorization (EUA). This EUA will remain  in effect (meaning this test can be used) for the duration of the  Covid-19 declaration under Section 564(b)(1) of the Act, 21  U.S.C. section 360bbb-3(b)(1), unless the authorization is  terminated or revoked. Performed at [facility], [address] [place], [place] [address]   MRSA PCR Screening     Status: None   Collection Time: 01/25/20  6:50 PM   Specimen: Nasopharyngeal  Result Value Ref Range Status   MRSA by PCR NEGATIVE NEGATIVE Final    Comment:        The GeneXpert MRSA Assay (FDA approved for NASAL specimens only), is one component of a comprehensive MRSA colonization surveillance program. It is not intended to diagnose MRSA infection nor to guide or monitor treatment for MRSA infections. Performed at [facility], [address] [place], [place] [address]      Labs: BNP (last 3 results) No results for input(s): BNP in the last 8760 hours. Basic Metabolic Panel: Recent Labs  Lab 01/25/20 0851 01/25/20 1332 01/25/20 1733 01/26/20 0905 01/26/20 1231 01/27/20 0530 01/28/20 0657  NA   < >  --  140 138 138 136 132*  K   < >  --  3.6 3.2* 3.1* 3.0* 3.1*  CL   < >  --  104 101 102 101 93*  CO2   < >  --  19* _0 GLUCOSE   < >  --  123* 114* 144* 224* 203*  BUN   < >  --  _1 CREATININE   < >  --  0.61 0.66 0.63 0.62 0.57  CALCIUM   < >  --  9.1 9.6 9.2 9.0 9.4  MG  --  1.8  --   --   --  1.7  --   PHOS  --  3.8  --   --   --   --   --    < > =  values in this interval not displayed.   Liver Function Tests: Recent Labs  Lab 01/23/20 1854 01/24/20 2140 01/25/20 0851  AST 12* 14* 16  ALT _0 ALKPHOS 56 55 61  BILITOT 1.2 1.3* 1.6*  PROT 8.1 8.3* 9.3*  ALBUMIN 4.5 4.4 5.0   Recent Labs  Lab 01/23/20 1854  01/24/20 2140 01/25/20 0851 01/26/20 1231  LIPASE 52* 43 27 28   No results for input(s): AMMONIA in the last 168 hours. CBC: Recent Labs  Lab 01/23/20 1854 01/24/20 2140 01/25/20 0851 01/26/20 1131 01/27/20 0530  WBC 8.2 8.9 18.9* 15.3* 9.5  HGB 14.2 13.9 14.9 11.8* 11.5*  HCT 42.2 41.0 45.6 34.4* 35.1*  MCV 84.6 84.2 84.4 82.7 86.2  PLT 258 245 303 231 215   Cardiac Enzymes: No results for input(s): CKTOTAL, CKMB, CKMBINDEX, TROPONINI in the last 168 hours. BNP: Invalid input(s): POCBNP CBG: Recent Labs  Lab 01/27/20 1146 01/27/20 1632 01/27/20 2128 01/28/20 0744 01/28/20 1127  GLUCAP 229* 218* 113* 179* 96   D-Dimer No results for input(s): DDIMER in the last 72 hours. Hgb A1c No results for input(s): HGBA1C in the last 72 hours. Lipid Profile No results for input(s): CHOL, HDL, LDLCALC, TRIG, CHOLHDL, LDLDIRECT in the last 72 hours. Thyroid function studies No results for input(s): TSH, T4TOTAL, T3FREE, THYROIDAB in the last 72 hours.  Invalid input(s): FREET3 Anemia work up No results for input(s): VITAMINB12, FOLATE, FERRITIN, TIBC, IRON, RETICCTPCT in the last 72 hours. Urinalysis    Component Value Date/Time   COLORURINE STRAW (A) 01/25/2020 0851   APPEARANCEUR HAZY (A) 01/25/2020 0851   APPEARANCEUR HAZY 06/15/2014 2100   LABSPEC 1.020 01/25/2020 0851   LABSPEC 1.018 06/15/2014 2100   PHURINE 5.0 01/25/2020 0851   GLUCOSEU >=500 (A) 01/25/2020 0851   GLUCOSEU >=500 mg/dL 06/15/2014 2100   HGBUR SMALL (A) 01/25/2020 0851   BILIRUBINUR NEGATIVE 01/25/2020 0851   BILIRUBINUR NEGATIVE 06/15/2014 2100   KETONESUR 80 (A) 01/25/2020 0851   PROTEINUR >=300 (A) 01/25/2020 0851   UROBILINOGEN 1.0 02/09/2008 1519   NITRITE NEGATIVE 01/25/2020 0851   LEUKOCYTESUR NEGATIVE 01/25/2020 0851   LEUKOCYTESUR TRACE 06/15/2014 2100   Sepsis Labs Invalid input(s): PROCALCITONIN,  WBC,  LACTICIDVEN Microbiology Recent Results (from the past 240 hour(s))   Respiratory Panel by RT PCR (Flu A&B, Covid) - Nasopharyngeal Swab     Status: None   Collection Time: 01/23/20 10:24 PM   Specimen: Nasopharyngeal Swab  Result Value Ref Range Status   SARS Coronavirus 2 by RT PCR NEGATIVE NEGATIVE Final    Comment: (NOTE) SARS-CoV-2 target nucleic acids are NOT DETECTED.  The SARS-CoV-2 RNA is generally detectable in upper respiratoy specimens during the acute phase of infection. The lowest concentration of SARS-CoV-2 viral copies this assay can detect is 131 copies/mL. A negative result does not preclude SARS-Cov-2 infection and should not be used as the sole basis for treatment or other patient management decisions. A negative result may occur with  improper specimen collection/handling, submission of specimen other than nasopharyngeal swab, presence of viral mutation(s) within the areas targeted by this assay, and inadequate number of viral copies (<131 copies/mL). A negative result must be combined with clinical observations, patient history, and epidemiological information. The expected result is Negative.  Fact Sheet for Patients:  PinkCheek.be  Fact Sheet for Healthcare Providers:  GravelBags.it  This test is no t yet approved or cleared by the [place] FDA and  has been authorized for detection and/or  diagnosis of SARS-CoV-2 by FDA under an Emergency Use Authorization (EUA). This EUA will remain  in effect (meaning this test can be used) for the duration of the COVID-19 declaration under Section 564(b)(1) of the Act, 21 U.S.C. section 360bbb-3(b)(1), unless the authorization is terminated or revoked sooner.     Influenza A by PCR NEGATIVE NEGATIVE Final   Influenza B by PCR NEGATIVE NEGATIVE Final    Comment: (NOTE) The Xpert Xpress SARS-CoV-2/FLU/RSV assay is intended as an aid in  the diagnosis of influenza from Nasopharyngeal swab specimens and  should not be used as  a sole basis for treatment. Nasal washings and  aspirates are unacceptable for Xpert Xpress SARS-CoV-2/FLU/RSV  testing.  Fact Sheet for Patients: PinkCheek.be  Fact Sheet for Healthcare Providers: GravelBags.it  This test is not yet approved or cleared by the [place] FDA and  has been authorized for detection and/or diagnosis of SARS-CoV-2 by  FDA under an Emergency Use Authorization (EUA). This EUA will remain  in effect (meaning this test can be used) for the duration of the  Covid-19 declaration under Section 564(b)(1) of the Act, 21  U.S.C. section 360bbb-3(b)(1), unless the authorization is  terminated or revoked. Performed at [facility], [address], [place], [place] [address]   Urine Culture     Status: Abnormal   Collection Time: 01/25/20  8:51 AM   Specimen: Urine, Random  Result Value Ref Range Status   Specimen Description   Final    URINE, RANDOM Performed at [facility], [address] [place], [place] [address]    Special Requests   Final    NONE Performed at [facility], [address] [place], [place] [address]    Culture MULTIPLE SPECIES PRESENT, SUGGEST RECOLLECTION (A)  Final   Report Status 01/26/2020 FINAL  Final  Respiratory Panel by RT PCR (Flu A&B, Covid) - Nasopharyngeal Swab     Status: None   Collection Time: 01/25/20 10:06 AM   Specimen: Nasopharyngeal Swab  Result Value Ref Range Status   SARS Coronavirus 2 by RT PCR NEGATIVE NEGATIVE Final    Comment: (NOTE) SARS-CoV-2 target nucleic acids are NOT DETECTED.  The SARS-CoV-2 RNA is generally detectable in upper respiratoy specimens during the acute phase of infection. The lowest concentration of SARS-CoV-2 viral copies this assay can detect is 131 copies/mL. A negative result does not preclude SARS-Cov-2 infection and should not be used as the sole basis for treatment or other patient  management decisions. A negative result may occur with  improper specimen collection/handling, submission of specimen other than nasopharyngeal swab, presence of viral mutation(s) within the areas targeted by this assay, and inadequate number of viral copies (<131 copies/mL). A negative result must be combined with clinical observations, patient history, and epidemiological information. The expected result is Negative.  Fact Sheet for Patients:  PinkCheek.be  Fact Sheet for Healthcare Providers:  GravelBags.it  This test is no t yet approved or cleared by the [place] FDA and  has been authorized for detection and/or diagnosis of SARS-CoV-2 by FDA under an Emergency Use Authorization (EUA). This EUA will remain  in effect (meaning this test can be used) for the duration of the COVID-19 declaration under Section 564(b)(1) of the Act, 21 U.S.C. section 360bbb-3(b)(1), unless the authorization is terminated or revoked sooner.     Influenza A by PCR NEGATIVE NEGATIVE Final   Influenza B by PCR NEGATIVE NEGATIVE Final    Comment: (NOTE) The Xpert  Xpress SARS-CoV-2/FLU/RSV assay is intended as an aid in  the diagnosis of influenza from Nasopharyngeal swab specimens and  should not be used as a sole basis for treatment. Nasal washings and  aspirates are unacceptable for Xpert Xpress SARS-CoV-2/FLU/RSV  testing.  Fact Sheet for Patients: PinkCheek.be  Fact Sheet for Healthcare Providers: GravelBags.it  This test is not yet approved or cleared by the [place] FDA and  has been authorized for detection and/or diagnosis of SARS-CoV-2 by  FDA under an Emergency Use Authorization (EUA). This EUA will remain  in effect (meaning this test can be used) for the duration of the  Covid-19 declaration under Section 564(b)(1) of the Act, 21  U.S.C. section 360bbb-3(b)(1),  unless the authorization is  terminated or revoked. Performed at [facility], [address] [place], [place] [address]   MRSA PCR Screening     Status: None   Collection Time: 01/25/20  6:50 PM   Specimen: Nasopharyngeal  Result Value Ref Range Status   MRSA by PCR NEGATIVE NEGATIVE Final    Comment:        The GeneXpert MRSA Assay (FDA approved for NASAL specimens only), is one component of a comprehensive MRSA colonization surveillance program. It is not intended to diagnose MRSA infection nor to guide or monitor treatment for MRSA infections. Performed at [facility], [address] [place], [place] [address]      Time coordinating discharge: Over 30 minutes  SIGNED:   Ezekiel Slocumb, DO [facility] 01/28/2020, 3:00 PM   If 7PM-7AM, please contact night-coverage [email or web address]

## 2020-01-28 NOTE — Progress Notes (Signed)
Inpatient Diabetes Program Recommendations  AACE/ADA: New Consensus Statement on Inpatient Glycemic Control (2015)  Target Ranges:  Prepandial:   less than 140 mg/dL      Peak postprandial:   less than 180 mg/dL (1-2 hours)      Critically ill patients:  140 - 180 mg/dL   Results for DANNETTA, LEKAS (MRN [id number]) as of 01/28/2020 14:39  Ref. Range 01/26/2020 08:31 01/26/2020 11:19 01/26/2020 17:20 01/26/2020 19:59  Glucose-Capillary Latest Ref Range: 70 - 99 mg/dL 163 (H) 139 (H)   6 units LANTUS @10 :16am 257 (H)  5 units NOVOLOG  154 (H)   Results for RAECHELLE, SARTI (MRN [id number]) as of 01/28/2020 14:39  Ref. Range 01/27/2020 07:34 01/27/2020 11:46 01/27/2020 16:32 01/27/2020 21:28  Glucose-Capillary Latest Ref Range: 70 - 99 mg/dL 139 (H)   10 units LANTUS @9am  229 (H)  2 units NOVOLOG  218 (H)  2 units NOVOLOG  113 (H)   Results for EUGENIE, HAREWOOD (MRN [id number]) as of 01/28/2020 14:39  Ref. Range 01/28/2020 07:44 01/28/2020 11:27  Glucose-Capillary Latest Ref Range: 70 - 99 mg/dL 179 (H)  1 unit NOVOLOG  10 units LANTUS @10am  96    Diabetes history: DM2  Outpatient Diabetes meds:70/30 25 units QAM             70/3015units QPM             (Not taken any 70/30 in over a year)  Current orders: Lantus 10 units daily     Novolog 0-6 units ac/hs      MD- Based on CBG trends and the amount of Insulin pt requiring in the hospital, recommend we d/c pt home on the following:  70/30 Insulin 10 units BID with meals  This would provide pt with a total of 14 units longer-acting insulin throughout the day (in divided doses)    And would also provide 3 units quicker insulin BID with breakfast and dinner  (based on 0.25 unit/kg dosing)     --Will follow patient during hospitalization--  Wyn Quaker RN, MSN, CDE Diabetes Coordinator Inpatient Glycemic Control Team Team Pager: [phone] (8a-5p)

## 2020-03-31 ENCOUNTER — Telehealth: Payer: Self-pay | Admitting: Family Medicine

## 2020-03-31 NOTE — Telephone Encounter (Signed)
Call to client who reports unprotected sex with child's father on 03/13/2020. Client reports this week began having lower abd pain and noticed brownish discharge on toilet paper with wiping after voiding. Concerned may be pregnant and / or have an STI. Client reports no fever. Encouraged to keep [facility] appt as scheduled, call [facility] [place] for appt (PCP, has Medicaid) or for worsening symptoms, seek ED evaluation. Client states not using any BCM and usually has menses first of month, but no vaginal bleeding yet, only brownish discharge. Rich Number, RN

## 2020-03-31 NOTE — Telephone Encounter (Signed)
Patient has an appt for the 11th of this month but called in today to ask question about Pregnancy & STI.

## 2020-04-04 ENCOUNTER — Ambulatory Visit: Payer: Medicaid Other

## 2020-05-03 ENCOUNTER — Telehealth: Payer: Self-pay | Admitting: Family Medicine

## 2020-05-03 NOTE — Telephone Encounter (Signed)
Not a pt of [place] has not been seen at [facility] as noshowed all New pt apts. Per Barth Kirks 12/12/1005  and our policy we would not be alble to see pt due to no shows

## 2020-05-03 NOTE — Telephone Encounter (Signed)
Patient called to schedule an appt. With a PCP at the practice.  Last appt. Was in 2019 with [facility] Liter and former patient of Noemi Chapel.  Patient needs an appt. To get her anxiety medication.  Please call patient to schedule appt. With new PCP asap.  Tried the office but they were closed for lunch.  CB# [phone]

## 2020-05-05 ENCOUNTER — Inpatient Hospital Stay
Admission: EM | Admit: 2020-05-05 | Discharge: 2020-05-07 | DRG: 639 | Discharge status: Against Medical Advice | Payer: Medicaid Other | Attending: Internal Medicine | Admitting: Internal Medicine

## 2020-05-05 ENCOUNTER — Encounter: Payer: Self-pay | Admitting: Emergency Medicine

## 2020-05-05 ENCOUNTER — Emergency Department: Payer: Medicaid Other

## 2020-05-05 ENCOUNTER — Other Ambulatory Visit: Payer: Self-pay

## 2020-05-05 DIAGNOSIS — K3184 Gastroparesis: Secondary | ICD-10-CM | POA: Diagnosis present

## 2020-05-05 DIAGNOSIS — R0789 Other chest pain: Secondary | ICD-10-CM | POA: Diagnosis not present

## 2020-05-05 DIAGNOSIS — Z7982 Long term (current) use of aspirin: Secondary | ICD-10-CM

## 2020-05-05 DIAGNOSIS — E872 Acidosis: Secondary | ICD-10-CM | POA: Diagnosis not present

## 2020-05-05 DIAGNOSIS — Z9049 Acquired absence of other specified parts of digestive tract: Secondary | ICD-10-CM

## 2020-05-05 DIAGNOSIS — I998 Other disorder of circulatory system: Secondary | ICD-10-CM | POA: Diagnosis present

## 2020-05-05 DIAGNOSIS — M329 Systemic lupus erythematosus, unspecified: Secondary | ICD-10-CM | POA: Diagnosis present

## 2020-05-05 DIAGNOSIS — Z794 Long term (current) use of insulin: Secondary | ICD-10-CM

## 2020-05-05 DIAGNOSIS — R079 Chest pain, unspecified: Secondary | ICD-10-CM | POA: Diagnosis not present

## 2020-05-05 DIAGNOSIS — R569 Unspecified convulsions: Secondary | ICD-10-CM | POA: Diagnosis present

## 2020-05-05 DIAGNOSIS — E8729 Other acidosis: Secondary | ICD-10-CM

## 2020-05-05 DIAGNOSIS — E1110 Type 2 diabetes mellitus with ketoacidosis without coma: Secondary | ICD-10-CM | POA: Diagnosis not present

## 2020-05-05 DIAGNOSIS — Z20822 Contact with and (suspected) exposure to covid-19: Secondary | ICD-10-CM | POA: Diagnosis present

## 2020-05-05 DIAGNOSIS — E1143 Type 2 diabetes mellitus with diabetic autonomic (poly)neuropathy: Secondary | ICD-10-CM | POA: Diagnosis not present

## 2020-05-05 DIAGNOSIS — E876 Hypokalemia: Secondary | ICD-10-CM | POA: Diagnosis present

## 2020-05-05 DIAGNOSIS — F17210 Nicotine dependence, cigarettes, uncomplicated: Secondary | ICD-10-CM | POA: Diagnosis present

## 2020-05-05 DIAGNOSIS — F419 Anxiety disorder, unspecified: Secondary | ICD-10-CM | POA: Diagnosis present

## 2020-05-05 DIAGNOSIS — Z9114 Patient's other noncompliance with medication regimen: Secondary | ICD-10-CM

## 2020-05-05 DIAGNOSIS — F445 Conversion disorder with seizures or convulsions: Secondary | ICD-10-CM | POA: Diagnosis not present

## 2020-05-05 DIAGNOSIS — F1220 Cannabis dependence, uncomplicated: Secondary | ICD-10-CM | POA: Diagnosis present

## 2020-05-05 DIAGNOSIS — R000 Tachycardia, unspecified: Secondary | ICD-10-CM

## 2020-05-05 DIAGNOSIS — Z720 Tobacco use: Secondary | ICD-10-CM | POA: Diagnosis present

## 2020-05-05 DIAGNOSIS — Z79899 Other long term (current) drug therapy: Secondary | ICD-10-CM

## 2020-05-05 DIAGNOSIS — E119 Type 2 diabetes mellitus without complications: Secondary | ICD-10-CM

## 2020-05-05 LAB — CBC WITH DIFFERENTIAL/PLATELET
Abs Immature Granulocytes: 0.04 10*3/uL (ref 0.00–0.07)
Basophils Absolute: 0 10*3/uL (ref 0.0–0.1)
Basophils Relative: 0 %
Eosinophils Absolute: 0 10*3/uL (ref 0.0–0.5)
Eosinophils Relative: 0 %
HCT: 37.7 % (ref 36.0–46.0)
Hemoglobin: 12.5 g/dL (ref 12.0–15.0)
Immature Granulocytes: 0 %
Lymphocytes Relative: 11 %
Lymphs Abs: 1.6 10*3/uL (ref 0.7–4.0)
MCH: 27.9 pg (ref 26.0–34.0)
MCHC: 33.2 g/dL (ref 30.0–36.0)
MCV: 84.2 fL (ref 80.0–100.0)
Monocytes Absolute: 0.5 10*3/uL (ref 0.1–1.0)
Monocytes Relative: 3 %
Neutro Abs: 12.4 10*3/uL — ABNORMAL HIGH (ref 1.7–7.7)
Neutrophils Relative %: 86 %
Platelets: 307 10*3/uL (ref 150–400)
RBC: 4.48 MIL/uL (ref 3.87–5.11)
RDW: 13.2 % (ref 11.5–15.5)
WBC: 14.6 10*3/uL — ABNORMAL HIGH (ref 4.0–10.5)
nRBC: 0 % (ref 0.0–0.2)

## 2020-05-05 LAB — BLOOD GAS, VENOUS
Acid-base deficit: 3.8 mmol/L — ABNORMAL HIGH (ref 0.0–2.0)
Bicarbonate: 21.5 mmol/L (ref 20.0–28.0)
O2 Saturation: 85.2 %
Patient temperature: 37
pCO2, Ven: 39 mmHg — ABNORMAL LOW (ref 44.0–60.0)
pH, Ven: 7.35 (ref 7.250–7.430)
pO2, Ven: 53 mmHg — ABNORMAL HIGH (ref 32.0–45.0)

## 2020-05-05 LAB — URINE DRUG SCREEN, QUALITATIVE (ARMC ONLY)
Amphetamines, Ur Screen: NOT DETECTED
Barbiturates, Ur Screen: NOT DETECTED
Benzodiazepine, Ur Scrn: NOT DETECTED
Cannabinoid 50 Ng, Ur ~~LOC~~: POSITIVE — AB
Cocaine Metabolite,Ur ~~LOC~~: NOT DETECTED
MDMA (Ecstasy)Ur Screen: NOT DETECTED
Methadone Scn, Ur: NOT DETECTED
Opiate, Ur Screen: NOT DETECTED
Phencyclidine (PCP) Ur S: NOT DETECTED
Tricyclic, Ur Screen: NOT DETECTED

## 2020-05-05 LAB — COMPREHENSIVE METABOLIC PANEL
ALT: 13 U/L (ref 0–44)
AST: 16 U/L (ref 15–41)
Albumin: 4.2 g/dL (ref 3.5–5.0)
Alkaline Phosphatase: 55 U/L (ref 38–126)
Anion gap: 19 — ABNORMAL HIGH (ref 5–15)
BUN: 11 mg/dL (ref 6–20)
CO2: 19 mmol/L — ABNORMAL LOW (ref 22–32)
Calcium: 9.8 mg/dL (ref 8.9–10.3)
Chloride: 97 mmol/L — ABNORMAL LOW (ref 98–111)
Creatinine, Ser: 0.66 mg/dL (ref 0.44–1.00)
GFR, Estimated: >60 mL/min (ref >60)
Glucose, Bld: 313 mg/dL — ABNORMAL HIGH (ref 70–99)
Potassium: 3.8 mmol/L (ref 3.5–5.1)
Sodium: 135 mmol/L (ref 135–145)
Total Bilirubin: 1.4 mg/dL — ABNORMAL HIGH (ref 0.3–1.2)
Total Protein: 8.4 g/dL — ABNORMAL HIGH (ref 6.5–8.1)

## 2020-05-05 LAB — RESP PANEL BY RT-PCR (FLU A&B, COVID) ARPGX2
Influenza A by PCR: NEGATIVE
Influenza B by PCR: NEGATIVE
SARS Coronavirus 2 by RT PCR: NEGATIVE

## 2020-05-05 LAB — CK: Total CK: 30 U/L — ABNORMAL LOW (ref 38–234)

## 2020-05-05 LAB — URINALYSIS, COMPLETE (UACMP) WITH MICROSCOPIC
Bacteria, UA: NONE SEEN
Bilirubin Urine: NEGATIVE
Glucose, UA: >=500 mg/dL — AB
Ketones, ur: 80 mg/dL — AB
Leukocytes,Ua: NEGATIVE
Nitrite: NEGATIVE
Protein, ur: 100 mg/dL — AB
RBC / HPF: >50 RBC/hpf — ABNORMAL HIGH (ref 0–5)
Specific Gravity, Urine: 1.026 (ref 1.005–1.030)
pH: 5 (ref 5.0–8.0)

## 2020-05-05 LAB — CBG MONITORING, ED
Glucose-Capillary: 109 mg/dL — ABNORMAL HIGH (ref 70–99)
Glucose-Capillary: 246 mg/dL — ABNORMAL HIGH (ref 70–99)
Glucose-Capillary: 331 mg/dL — ABNORMAL HIGH (ref 70–99)

## 2020-05-05 LAB — TROPONIN I (HIGH SENSITIVITY)
Troponin I (High Sensitivity): 4 ng/L (ref <18)
Troponin I (High Sensitivity): 5 ng/L (ref <18)

## 2020-05-05 LAB — BETA-HYDROXYBUTYRIC ACID: Beta-Hydroxybutyric Acid: 4.63 mmol/L — ABNORMAL HIGH (ref 0.05–0.27)

## 2020-05-05 LAB — LIPASE, BLOOD: Lipase: 46 U/L (ref 11–51)

## 2020-05-05 MED ORDER — ONDANSETRON HCL 4 MG/2ML IJ SOLN
4.0000 mg | Freq: Once | INTRAMUSCULAR | Status: AC
  Administered 2020-05-05: 4 mg via INTRAVENOUS
  Filled 2020-05-05: qty 2

## 2020-05-05 MED ORDER — ATORVASTATIN CALCIUM 20 MG PO TABS
40.0000 mg | ORAL_TABLET | Freq: Every day | ORAL | Status: DC
  Administered 2020-05-06 – 2020-05-07 (×2): 40 mg via ORAL
  Filled 2020-05-05 (×2): qty 2

## 2020-05-05 MED ORDER — LACTATED RINGERS IV SOLN
INTRAVENOUS | Status: DC
  Administered 2020-05-06: 125 mL via INTRAVENOUS

## 2020-05-05 MED ORDER — LORAZEPAM 2 MG/ML IJ SOLN
0.5000 mg | INTRAMUSCULAR | Status: AC | PRN
  Administered 2020-05-05 – 2020-05-06 (×3): 0.5 mg via INTRAVENOUS
  Filled 2020-05-05 (×3): qty 1

## 2020-05-05 MED ORDER — INSULIN REGULAR(HUMAN) IN NACL 100-0.9 UT/100ML-% IV SOLN
INTRAVENOUS | Status: DC
  Administered 2020-05-05: 13 [IU]/h via INTRAVENOUS
  Filled 2020-05-05: qty 100

## 2020-05-05 MED ORDER — LIDOCAINE HCL (PF) 1 % IJ SOLN
5.0000 mL | Freq: Once | INTRAMUSCULAR | Status: AC
  Administered 2020-05-05: 5 mL via INTRADERMAL
  Filled 2020-05-05: qty 5

## 2020-05-05 MED ORDER — DEXTROSE IN LACTATED RINGERS 5 % IV SOLN: INTRAVENOUS | Status: DC

## 2020-05-05 MED ORDER — LORAZEPAM 2 MG/ML IJ SOLN
2.0000 mg | Freq: Once | INTRAMUSCULAR | Status: AC
  Administered 2020-05-05: 2 mg via INTRAVENOUS
  Filled 2020-05-05: qty 1

## 2020-05-05 MED ORDER — DEXTROSE 50 % IV SOLN
0.0000 mL | INTRAVENOUS | Status: DC | PRN
  Administered 2020-05-06: 15 mL via INTRAVENOUS

## 2020-05-05 MED ORDER — LORAZEPAM 2 MG/ML IJ SOLN
1.0000 mg | Freq: Once | INTRAMUSCULAR | Status: AC
  Administered 2020-05-05: 1 mg via INTRAVENOUS
  Filled 2020-05-05: qty 1

## 2020-05-05 MED ORDER — LACTATED RINGERS IV BOLUS
1000.0000 mL | Freq: Once | INTRAVENOUS | Status: AC
  Administered 2020-05-05: 1000 mL via INTRAVENOUS

## 2020-05-05 MED ORDER — ENOXAPARIN SODIUM 40 MG/0.4ML ~~LOC~~ SOLN
40.0000 mg | SUBCUTANEOUS | Status: DC
  Administered 2020-05-05: 40 mg via SUBCUTANEOUS
  Filled 2020-05-05: qty 0.4

## 2020-05-05 MED ORDER — METOPROLOL TARTRATE 25 MG PO TABS
12.5000 mg | ORAL_TABLET | Freq: Two times a day (BID) | ORAL | Status: DC
  Administered 2020-05-05 – 2020-05-07 (×4): 12.5 mg via ORAL
  Filled 2020-05-05 (×6): qty 1

## 2020-05-05 MED ORDER — SODIUM CHLORIDE 0.9 % IV BOLUS
1000.0000 mL | Freq: Once | INTRAVENOUS | Status: AC
  Administered 2020-05-05: 1000 mL via INTRAVENOUS

## 2020-05-05 MED ORDER — ASPIRIN EC 81 MG PO TBEC
81.0000 mg | DELAYED_RELEASE_TABLET | Freq: Every day | ORAL | Status: DC
  Administered 2020-05-06 – 2020-05-07 (×2): 81 mg via ORAL
  Filled 2020-05-05 (×3): qty 1

## 2020-05-05 MED ORDER — METOCLOPRAMIDE HCL 5 MG/ML IJ SOLN
10.0000 mg | Freq: Once | INTRAMUSCULAR | Status: AC
  Administered 2020-05-05: 10 mg via INTRAVENOUS
  Filled 2020-05-05: qty 2

## 2020-05-05 MED ORDER — LORAZEPAM 2 MG/ML IJ SOLN
1.0000 mg | Freq: Once | INTRAMUSCULAR | Status: DC
  Filled 2020-05-05: qty 1

## 2020-05-05 MED ORDER — METOCLOPRAMIDE HCL 5 MG/ML IJ SOLN
10.0000 mg | Freq: Four times a day (QID) | INTRAMUSCULAR | Status: DC | PRN
  Administered 2020-05-06 (×2): 10 mg via INTRAVENOUS
  Filled 2020-05-05 (×3): qty 2

## 2020-05-05 MED ORDER — POTASSIUM CHLORIDE 10 MEQ/100ML IV SOLN
10.0000 meq | INTRAVENOUS | Status: AC
Start: 2020-05-05 — End: 2020-05-06
  Administered 2020-05-05 (×2): 10 meq via INTRAVENOUS
  Filled 2020-05-05 (×3): qty 100

## 2020-05-05 MED ORDER — PANTOPRAZOLE SODIUM 40 MG PO TBEC
40.0000 mg | DELAYED_RELEASE_TABLET | Freq: Two times a day (BID) | ORAL | Status: DC
  Administered 2020-05-06 – 2020-05-07 (×4): 40 mg via ORAL
  Filled 2020-05-05 (×4): qty 1

## 2020-05-05 NOTE — ED Provider Notes (Addendum)
[facility] Emergency Department Provider Note    Event Date/Time   First MD Initiated Contact with Patient 05/05/20 1747     (approximate)  I have reviewed the triage vital signs and the nursing notes.   HISTORY  Chief Complaint Chest Pain    HPI Rachael Gray is a 40 y.o. female below listed past medical history presents to the ER for evaluation of chest pain shortness of breath and severe anxiety.  States that she is been feeling unwell for the past 24 to 36 hours.  On review of records does have history of admissions for NSTEMI as well as DKA.  Patient very anxious rocking back and forth unable to sit still states he has been doing this for more than 24 hours.   Patient has reportedly been out of meds for quite some time.  Has history of pseudoseizures as well as history of poorly controlled diabetes.  Has been feeling nauseated.  Denies any other substance use.  Does not think that she is pregnant.   Past Medical History:  Diagnosis Date  . Collagen vascular disease ([place])   . Diabetes mellitus without complication ([place])   . Gastroparesis   . Lupus ([place])   . 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 Seizures ([place])    Family History  Problem Relation Age of Onset  . Breast cancer Maternal Grandmother    Past Surgical History:  Procedure Laterality Date  . APPENDECTOMY    . CHOLECYSTECTOMY     Patient Active Problem List   Diagnosis Date Noted  . DKA (diabetic ketoacidosis) ([place]) 01/25/2020  . Tobacco abuse disorder 01/25/2020  . SIRS due to non-infectious process without acute organ dysfunction ([place]) 01/25/2020  . DKA, type 2 ([place]) 01/25/2020  . Cannabis use disorder, moderate, dependence ([place]) 12/01/2016  . 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 Hypokalemia 12/24/2015  . Anemia 12/24/2015  . Diabetes ([place]) 12/24/2015  . Gastroparesis due to DM ([place])   . Acute respiratory alkalosis 12/22/2015  . Duodenitis 12/22/2015  . Vomiting  12/16/2015  . Nausea & vomiting 12/10/2014  . Narcotic abuse ([place]) 12/09/2014  . Sepsis ([place]) 12/07/2014  . Anxiety   . Pyelonephritis 12/04/2014  . Lactic acidosis 12/04/2014    Class: Acute      Prior to Admission medications   Medication Sig Start Date End Date Taking? Authorizing Provider  aspirin EC 81 MG EC tablet Take 1 tablet (81 mg total) by mouth daily. Swallow whole. 01/29/20   Ezekiel Slocumb, DO  atorvastatin (LIPITOR) 40 MG tablet Take 1 tablet (40 mg total) by mouth at bedtime. 01/28/20   Ezekiel Slocumb, DO  hydrocodone-ibuprofen (VICOPROFEN) 5-200 MG tablet Take 1 tablet by mouth every 8 (eight) hours as needed for pain. Patient not taking: Reported on 01/25/2020 01/23/20   Nena Polio, MD  insulin NPH-regular Human (NOVOLIN 70/30) (70-30) 100 UNIT/ML injection Inject 10 units under the skin twice daily with breakfast and dinner 01/28/20   Nicole Kindred A, DO  levETIRAcetam (KEPPRA) 750 MG tablet Take 1 tablet (750 mg total) by mouth 2 (two) times daily. Patient not taking: Reported on 06/09/2016 08/21/15   Paulette Blanch, MD  methocarbamol (ROBAXIN) 500 MG tablet Take 1 tablet (500 mg total) by mouth every 8 (eight) hours as needed for muscle spasms. 01/28/20   Ezekiel Slocumb, DO  metoCLOPramide (REGLAN) 10 MG tablet Take 1 tablet (10 mg total) by mouth every 8 (eight) hours as needed. Patient not  taking: Reported on 03/06/2017 02/25/17   Loney Hering, MD  metoprolol tartrate (LOPRESSOR) 25 MG tablet Take 0.5 tablets (12.5 mg total) by mouth 2 (two) times daily. 01/28/20   Nicole Kindred A, DO  ondansetron (ZOFRAN ODT) 4 MG disintegrating tablet Take 1 tablet (4 mg total) by mouth every 8 (eight) hours as needed for nausea or vomiting. Patient not taking: Reported on 06/13/2017 04/28/17   Rudene Re, MD  pantoprazole (PROTONIX) 40 MG tablet Take 1 tablet (40 mg total) by mouth 2 (two) times daily. 01/28/20   Nicole Kindred A, DO  potassium chloride 20 MEQ  TBCR Take 40 mEq by mouth 2 (two) times daily for 10 days. 01/28/20 02/07/20  Ezekiel Slocumb, DO    Allergies Dilantin [phenytoin sodium extended], Doxycycline, Other, Morphine and related, and Tylenol [acetaminophen]    Social History Social History   Tobacco Use  . Smoking status: Current Every Day Smoker    Packs/day: 0.50    Years: 10.00    Pack years: 5.00    Types: Cigarettes  . Smokeless tobacco: Never Used  Vaping Use  . Vaping Use: Never used  Substance Use Topics  . Alcohol use: No  . Drug use: Yes    Types: Marijuana    Comment: 3 months ago.     Review of Systems Patient denies headaches, rhinorrhea, blurry vision, numbness, shortness of breath, chest pain, edema, cough, abdominal pain, nausea, vomiting, diarrhea, dysuria, fevers, rashes or hallucinations unless otherwise stated above in HPI. ____________________________________________   PHYSICAL EXAM:  VITAL SIGNS: Vitals:   05/05/20 1900 05/05/20 1930  BP: 124/83 (!) 106/54  Pulse: (!) 130 (!) 117  Resp: 14 18  Temp:    SpO2: 100% 100%    Constitutional: Alert and oriented. Rocking back and forth in er bed.   Eyes: Conjunctivae are normal.  Head: Atraumatic. Nose: No congestion/rhinnorhea. Mouth/Throat: Mucous membranes are moist.   Neck: No stridor. Painless ROM.  Cardiovascular: tachycardic rate, regular rhythm. Grossly normal heart sounds.  Good peripheral circulation. Respiratory: Normal respiratory effort.  No retractions. Lungs CTAB. Gastrointestinal: Soft and nontender. No distention. No abdominal bruits. No CVA tenderness. Genitourinary: deferred Musculoskeletal: No lower extremity tenderness nor edema.  No joint effusions. Neurologic:  Normal speech and language. No gross focal neurologic deficits are appreciated. No facial droop Skin:  Skin is warm, dry and intact. No rash noted. Psychiatric: Mood and affect are anxious. Speech and behavior are  normal.  ____________________________________________   LABS (all labs ordered are listed, but only abnormal results are displayed)  Results for orders placed or performed during the hospital encounter of 05/05/20 (from the past 24 hour(s))  CK     Status: Abnormal   Collection Time: 05/05/20  5:55 PM  Result Value Ref Range   Total CK 30 (L) 38 - 234 U/L  Comprehensive metabolic panel     Status: Abnormal   Collection Time: 05/05/20  6:48 PM  Result Value Ref Range   Sodium 135 135 - 145 mmol/L   Potassium 3.8 3.5 - 5.1 mmol/L   Chloride 97 (L) 98 - 111 mmol/L   CO2 19 (L) 22 - 32 mmol/L   Glucose, Bld 313 (H) 70 - 99 mg/dL   BUN 11 6 - 20 mg/dL   Creatinine, Ser 0.66 0.44 - 1.00 mg/dL   Calcium 9.8 8.9 - 10.3 mg/dL   Total Protein 8.4 (H) 6.5 - 8.1 g/dL   Albumin 4.2 3.5 - 5.0 g/dL  AST 16 15 - 41 U/L   ALT 13 0 - 44 U/L   Alkaline Phosphatase 55 38 - 126 U/L   Total Bilirubin 1.4 (H) 0.3 - 1.2 mg/dL   GFR, Estimated >60 >60 mL/min   Anion gap 19 (H) 5 - 15  Troponin I (High Sensitivity)     Status: None   Collection Time: 05/05/20  6:48 PM  Result Value Ref Range   Troponin I (High Sensitivity) 4 <18 ng/L  CBG monitoring, ED     Status: Abnormal   Collection Time: 05/05/20  7:58 PM  Result Value Ref Range   Glucose-Capillary 246 (H) 70 - 99 mg/dL   ____________________________________________  EKG My review and personal interpretation at Time: 19:39   Indication: chest pain  Rate: 115  Rhythm: sinus Axis: normal Other: normal intervals, no stemi ____________________________________________  RADIOLOGY  I personally reviewed all radiographic images ordered to evaluate for the above acute complaints and reviewed radiology reports and findings.  These findings were personally discussed with the patient.  Please see medical record for radiology report.  ____________________________________________   PROCEDURES  Procedure(s) performed:  Procedures  Due to  difficulty with obtaining IV access, a 20G peripheral IV catheter was inserted using US guidance into the LUE.  The site was prepped with chlorhexidine and allowed to dry.  The patient tolerated the procedure without any complications.   Critical Care performed: no ____________________________________________   INITIAL IMPRESSION / ASSESSMENT AND PLAN / ED COURSE  Pertinent labs & imaging results that were available during my care of the patient were reviewed by me and considered in my medical decision making (see chart for details).   DDX: ACS, dysrhythmia, gastritis, enteritis, DKA, electrolyte abnormality, dehydration, anxiety, panic attack  Rachael Gray is a 40 y.o. who presents to the ED with presentation as described above.  Patient very anxious appearing very tachycardic but appears well perfused no hypoxia.  EKG initially with tachycardia but limited due to motion artifact.  Because she appears so anxious will give IV Ativan.  Blood will be sent for blood differentials will give IV fluids.  Clinical Course as of 05/05/20 2102  Fri May 05, 2020  2021 Patient has been rechecked several times.  She is currently more comfortable has had some nausea.  Discussed my concern that her blood work does meet criteria for DKA.  States that she supposed to be on insulin is not been able to afford it for several weeks.  Fortunately based on his insofar is very mild.  Will give IV fluids.  Still waiting on blood work.  She is now resting.  EKG repeated nonischemic.  Troponin negative. [PR]  2050 Blood work clotted and unable to process patient difficult stick therefore I placed ultrasound-guided IV.  Additional IV fluids ordered.  Patient will have to be signed out to oncoming physician for follow-up on labs. [PR]    Clinical Course User Index [PR] Merlyn Lot, MD    The patient was evaluated in Emergency Department today for the symptoms described in the history of present illness.  He/she was evaluated in the context of the global COVID-19 pandemic, which necessitated consideration that the patient might be at risk for infection with the SARS-CoV-2 virus that causes COVID-19. Institutional protocols and algorithms that pertain to the evaluation of patients at risk for COVID-19 are in a state of rapid change based on information released by regulatory bodies including the CDC and federal and state organizations. These policies and  algorithms were followed during the patient's care in the ED.  As part of my medical decision making, I reviewed the following data within the [place] notes reviewed and incorporated, Labs reviewed, notes from prior ED visits and [place] Controlled Substance Database   ____________________________________________   FINAL CLINICAL IMPRESSION(S) / ED DIAGNOSES  Final diagnoses:  Chest pain, unspecified type  Tachycardia  High anion gap metabolic acidosis      NEW MEDICATIONS STARTED DURING THIS VISIT:  New Prescriptions   No medications on file     Note:  This document was prepared using Dragon voice recognition software and may include unintentional dictation errors.      Merlyn Lot, MD 05/05/20 2103

## 2020-05-05 NOTE — ED Notes (Signed)
Patient daughter came out of room stating patient needed to urinate. This RN came to room and found patient sitting on edge of bed urinating onto floor. RN was able to catch some specimen for sample. Patient cleaned up, bed sheets changed, and floor cleaned.

## 2020-05-05 NOTE — ED Notes (Signed)
Patient has uncontrollable tremors according to patient and patient's daughter. Patient normally takes xanax for anxiety to control tremors but has been off of medication for past few days due to not having PCP. Per daughter, patient has been like this x2 days with chest pain and not sleeping at home.

## 2020-05-05 NOTE — H&P (Signed)
History and Physical   Rachael Gray VHQ:[id number] DOB: May 28, 1980 DOA: 05/05/2020  PCP: Patient, No Pcp Per   Patient coming from: Home  Chief Complaint: Chest pain, shortness of breath, anxiety  HPI: Rachael Gray is a 40 y.o. female with medical history significant of anemia, marijuana use, tobacco use, diabetes, recurrent DKA, gastroparesis, psychogenic nonepileptic seizure, history of troponin elevation presents with 1 to 2 days of feeling unwell and also having chest pain shortness of breath and anxiety as above.  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  In the ED she has been rocking back and forth at times and having tremors.  Tremors are noted to be controllable by the patient when she answers her phone and then restart when she stopped using the phone.  Patient states that she has been unwell for a couple of days and she usually takes Xanax for anxiety to help with her tremors but she has been off her medication for the past few days.  She is also been without her other medications since January due to not currently having a PCP.  She reports nausea, vomiting, abdominal pain as well. She denies fever, chills, constipation, diarrhea.  ED Course: Vital signs in the ED significant for heart rate in the 120s to 140s, respirations in the teens to 20s, blood pressure in the 413K to 440N systolic, saturating well on room air.  Lab work showed BMP with chloride 97, bicarb 19, anion gap of 19, glucose 313.  LFTs with protein 8.4 and T bili 1.4.  CBC with leukocytosis to 14.  CK normal, troponin normal x2, lipase normal, respiratory panel for flu and COVID normal.  Urinalysis showed glucose, hemoglobin, ketones, protein.  UDS and ethanol level pending.  But hydroxybutyric acid elevated at 4.63.  VBG showed pH 7.35 and PCO2 39.  Imaging showed chest x-ray with out acute abnormality.  Patient started on DKA protocol in  the ED.  Review of Systems: As per HPI otherwise all other systems reviewed and are negative.  Past Medical History:  Diagnosis Date  . Collagen vascular disease ([place])   . Diabetes mellitus without complication ([place])   . Gastroparesis   . Lupus ([place])   . Pseudoseizures ([place])   . Seizures ([place])     Past Surgical History:  Procedure Laterality Date  . APPENDECTOMY    . CHOLECYSTECTOMY      Social History  reports that she has been smoking cigarettes. She has a 5.00 pack-year smoking history. She has never used smokeless tobacco. She reports current drug use. Drug: Marijuana. She reports that she does not drink alcohol.  Allergies  Allergen Reactions  . Dilantin [Phenytoin Sodium Extended] Hives  . Doxycycline Swelling  . Other   . Morphine And Related Rash  . Tylenol [Acetaminophen] Rash    Family History  Problem Relation Age of Onset  . Breast cancer Maternal Grandmother   Reviewed on admission  Prior to Admission medications   Medication Sig Start Date End Date Taking? Authorizing Provider  aspirin EC 81 MG EC tablet Take 1 tablet (81 mg total) by mouth daily. Swallow whole. 01/29/20   Ezekiel Slocumb, DO  atorvastatin (LIPITOR) 40 MG tablet Take 1 tablet (40 mg total) by mouth at bedtime. 01/28/20   Ezekiel Slocumb, DO  hydrocodone-ibuprofen (VICOPROFEN) 5-200 MG tablet Take 1 tablet by mouth every 8 (eight) hours as needed for pain. Patient  not taking: Reported on 01/25/2020 01/23/20   Nena Polio, MD  insulin NPH-regular Human (NOVOLIN 70/30) (70-30) 100 UNIT/ML injection Inject 10 units under the skin twice daily with breakfast and dinner 01/28/20   Nicole Kindred A, DO  levETIRAcetam (KEPPRA) 750 MG tablet Take 1 tablet (750 mg total) by mouth 2 (two) times daily. Patient not taking: Reported on 06/09/2016 08/21/15   Paulette Blanch, MD  methocarbamol (ROBAXIN) 500 MG tablet Take 1 tablet (500 mg total) by mouth every 8 (eight) hours as needed for muscle spasms.  01/28/20   Ezekiel Slocumb, DO  metoCLOPramide (REGLAN) 10 MG tablet Take 1 tablet (10 mg total) by mouth every 8 (eight) hours as needed. Patient not taking: Reported on 03/06/2017 02/25/17   Loney Hering, MD  metoprolol tartrate (LOPRESSOR) 25 MG tablet Take 0.5 tablets (12.5 mg total) by mouth 2 (two) times daily. 01/28/20   Nicole Kindred A, DO  ondansetron (ZOFRAN ODT) 4 MG disintegrating tablet Take 1 tablet (4 mg total) by mouth every 8 (eight) hours as needed for nausea or vomiting. Patient not taking: Reported on 06/13/2017 04/28/17   Rudene Re, MD  pantoprazole (PROTONIX) 40 MG tablet Take 1 tablet (40 mg total) by mouth 2 (two) times daily. 01/28/20   Nicole Kindred A, DO  potassium chloride 20 MEQ TBCR Take 40 mEq by mouth 2 (two) times daily for 10 days. 01/28/20 02/07/20  Ezekiel Slocumb, DO    Physical Exam: Vitals:   05/05/20 2000 05/05/20 2100 05/05/20 2133 05/05/20 2230  BP: 104/60 (!) 173/155 133/74 (!) 151/83  Pulse: (!) 115 (!) 137 (!) 125 (!) 115  Resp: 17 20 (!) 22 14  Temp:      TempSrc:      SpO2: 100% 100% 100% 100%  Weight:      Height:       Physical Exam Constitutional:      General: She is not in acute distress.    Appearance: Normal appearance. She is ill-appearing.     Comments: Rhythmic shaking, which resolved when throwing up  HENT:     Head: Normocephalic and atraumatic.     Mouth/Throat:     Mouth: Mucous membranes are moist.     Pharynx: Oropharynx is clear.  Eyes:     Extraocular Movements: Extraocular movements intact.     Pupils: Pupils are equal, round, and reactive to light.  Cardiovascular:     Rate and Rhythm: Regular rhythm. Tachycardia present.     Pulses: Normal pulses.     Heart sounds: Normal heart sounds.  Pulmonary:     Effort: Pulmonary effort is normal. No respiratory distress.     Breath sounds: Normal breath sounds.     Comments: Tachypneic Abdominal:     General: Bowel sounds are normal. There is no  distension.     Palpations: Abdomen is soft.     Tenderness: There is abdominal tenderness.  Musculoskeletal:        General: No swelling or deformity.  Skin:    General: Skin is warm and dry.  Neurological:     General: No focal deficit present.     Mental Status: Mental status is at baseline.    Labs on Admission: I have personally reviewed following labs and imaging studies  CBC: Recent Labs  Lab 05/05/20 2000  WBC 14.6*  NEUTROABS 12.4*  HGB 12.5  HCT 37.7  MCV 84.2  PLT 202    Basic Metabolic Panel: Recent  Labs  Lab 05/05/20 1848  NA 135  K 3.8  CL 97*  CO2 19*  GLUCOSE 313*  BUN 11  CREATININE 0.66  CALCIUM 9.8    GFR: Estimated Creatinine Clearance: 85.8 mL/min (by C-G formula based on SCr of 0.66 mg/dL).  Liver Function Tests: Recent Labs  Lab 05/05/20 1848  AST 16  ALT 13  ALKPHOS 55  BILITOT 1.4*  PROT 8.4*  ALBUMIN 4.2    Urine analysis:    Component Value Date/Time   COLORURINE YELLOW (A) 05/05/2020 1852   APPEARANCEUR CLEAR (A) 05/05/2020 1852   APPEARANCEUR HAZY 06/15/2014 2100   LABSPEC 1.026 05/05/2020 1852   LABSPEC 1.018 06/15/2014 2100   PHURINE 5.0 05/05/2020 1852   GLUCOSEU >=500 (A) 05/05/2020 1852   GLUCOSEU >=500 mg/dL 06/15/2014 2100   HGBUR LARGE (A) 05/05/2020 1852   BILIRUBINUR NEGATIVE 05/05/2020 1852   BILIRUBINUR NEGATIVE 06/15/2014 2100   KETONESUR 80 (A) 05/05/2020 1852   PROTEINUR 100 (A) 05/05/2020 1852   UROBILINOGEN 1.0 02/09/2008 1519   NITRITE NEGATIVE 05/05/2020 1852   LEUKOCYTESUR NEGATIVE 05/05/2020 1852   LEUKOCYTESUR TRACE 06/15/2014 2100    Radiological Exams on Admission: DG Chest Portable 1 View  Result Date: 05/05/2020 CLINICAL DATA:  Chest pain. EXAM: PORTABLE CHEST 1 VIEW COMPARISON:  Radiograph 08/15/2018 FINDINGS: The cardiomediastinal contours are normal. The lungs are clear. Pulmonary vasculature is normal. No consolidation, pleural effusion, or pneumothorax. No acute osseous  abnormalities are seen. IMPRESSION: Negative AP view of the chest. Electronically Signed   By: Keith Rake M.D.   On: 05/05/2020 19:15   EKG: Independently reviewed.  Unable to be reviewed due to technical difficulties with EMR.  Assessment/Plan Principal Problem:   DKA (diabetic ketoacidosis) ([place]) Active Problems:   Anxiety   Gastroparesis due to DM ([place])   Diabetes ([place])   Tobacco abuse disorder   Psychogenic nonepileptic seizure   Cannabis use disorder, moderate, dependence ([place])  DKA > Patient presents with feeling unwell for several days in the setting of being out of her medications.  Nausea, vomiting, abdominal pain.  > Glucose 313, bicarb 19, gap 19, beta hydroxybutyric acid 4.63, pH 7.35, ketones in urine. > Started on insulin drip in the ED. also received 3 L IV fluids in ED. - Admit to stepdown unit - Continue insulin drip - Has received potassium supplementation 20 mEq IV total - LR at 125 mL/hr until CBG less than 250 - Switch to D5-LR when 1 CBG less than 250 - Nothing by mouth  - BMET every 4 hours - CBG Q1H - Once anion gap closed 2, start CM diet and if able to eat, administer Lantus 10 units - Continue insulin drip for 1-2 more hours, then discontinue and start SSI-S  - DC fluids if eating, drinking, and off insulin drip  Leukocytosis > Likely reactive in the setting of her DKA, no other etiology found thus far. > Chest x-ray without acute abnormality and urinalysis does not show signs of UTI - Continue to monitor fever curve and white count  Gastroparesis > Episode of nonbloody nonbilious watery vomit while evaluating patient in the setting of both this gastroparesis and DKA as above. - Reglan IV  History of troponin elevation > History of elevated troponin during previous admission in November of last year, questionable NSTEMI started on medical therapy with plan for ischemic evaluation, which has not yet been performed > Troponin negative x2  today > Has been out of medications since January per  her report - Restart aspirin, statin, metoprolol  Anxiety Psychogenic nonepileptic seizures > History of anxiety and pseudoseizures which manifest with pain for her daughter > Has used Xanax for anxiety/ACS in the past but is out of this > Rhythmic shaking in the ED, she has a history of this.  This is contributing to her tachycardia.  Nurse states that when she sleeps her heart rate improved significantly but does remain in the low 100s. > Checking UDS and alcohol level for any substances or withdrawal that may be contributing - Continue to monitor - Low-dose as needed Ativan  History of marijuana use and tobacco use  DVT prophylaxis: Lovenox Code Status:   Full  Family Communication:  Her daughter was present at bedside. Disposition Plan:   Patient is from:  Home  Anticipated DC to:  Home  Anticipated DC date:  1 to 2 days  Anticipated DC barriers: None  Consults called:  None  Admission status:  Observation, stepdown   Severity of Illness: The appropriate patient status for this patient is OBSERVATION. Observation status is judged to be reasonable and necessary in order to provide the required intensity of service to ensure the patient's safety. The patient's presenting symptoms, physical exam findings, and initial radiographic and laboratory data in the context of their medical condition is felt to place them at decreased risk for further clinical deterioration. Furthermore, it is anticipated that the patient will be medically stable for discharge from the hospital within 2 midnights of admission. The following factors support the patient status of observation.   " The patient's presenting symptoms include shortness of breath, chest pain, anxiety, weakness, abdominal tenderness, nausea, vomiting. " The physical exam findings include tachycardia, tachypnea, tremulousness abdominal tenderness. " The initial radiographic and  laboratory data are concerning for bicarb 19, gap 19, glucose 33, leukocytosis to 14.6, ketones protein and hemoglobin in the urine, but hydroxybutyric acid 4.63.Marland Kitchen  Marcelyn Bruins MD [facility]  How to contact the [facility] Attending or Consulting provider [place] or covering provider during after hours [place], for this patient?   1. Check the care team in [facility] and look for a) attending/consulting [facility] provider listed and b) the [facility] team listed 2. Log into [email or web address] and use [place]'s universal password to access. If you do not have the password, please contact the hospital operator. 3. Locate the [facility] provider you are looking for under [facility] and page to a number that you can be directly reached. 4. If you still have difficulty reaching the provider, please page the [facility] (Director on Call) for the Hospitalists listed on amion for assistance.  05/05/2020, 10:46 PM

## 2020-05-05 NOTE — ED Notes (Signed)
First Nurse Note: Pt to ED with full body tremors. Pt is alert and looking around. Pt is in NAD.

## 2020-05-05 NOTE — ED Notes (Signed)
Insulin infusion stopped per Endotool. BGL 109

## 2020-05-05 NOTE — ED Triage Notes (Signed)
Pt to ED via POV with her daughter. Pt reports that she had sudden onset chest pain. Pt with noted tremors in the form rocking back and forth, pt's daughter reports pt has psuedo seizures when in pain, pt reports has really bad anxiety. Initially pt noted to be holding her her arms up and rocking back and forth, when patients arm held to obtain BP pt noted to be jumping up and down. Tremors controllable by patient when she goes to answer her phone, tremors noted to stop then restart.

## 2020-05-05 NOTE — ED Notes (Addendum)
Patient sleeping at this time. No tremors noted while sleeping.

## 2020-05-06 DIAGNOSIS — F419 Anxiety disorder, unspecified: Secondary | ICD-10-CM | POA: Diagnosis not present

## 2020-05-06 DIAGNOSIS — Z9114 Patient's other noncompliance with medication regimen: Secondary | ICD-10-CM | POA: Diagnosis not present

## 2020-05-06 DIAGNOSIS — Z20822 Contact with and (suspected) exposure to covid-19: Secondary | ICD-10-CM | POA: Diagnosis not present

## 2020-05-06 DIAGNOSIS — E872 Acidosis: Secondary | ICD-10-CM | POA: Diagnosis not present

## 2020-05-06 DIAGNOSIS — F445 Conversion disorder with seizures or convulsions: Secondary | ICD-10-CM | POA: Diagnosis not present

## 2020-05-06 DIAGNOSIS — R079 Chest pain, unspecified: Secondary | ICD-10-CM | POA: Diagnosis not present

## 2020-05-06 DIAGNOSIS — E1110 Type 2 diabetes mellitus with ketoacidosis without coma: Principal | ICD-10-CM

## 2020-05-06 DIAGNOSIS — Z794 Long term (current) use of insulin: Secondary | ICD-10-CM | POA: Diagnosis not present

## 2020-05-06 DIAGNOSIS — E1143 Type 2 diabetes mellitus with diabetic autonomic (poly)neuropathy: Secondary | ICD-10-CM

## 2020-05-06 DIAGNOSIS — Z79899 Other long term (current) drug therapy: Secondary | ICD-10-CM | POA: Diagnosis not present

## 2020-05-06 DIAGNOSIS — R000 Tachycardia, unspecified: Secondary | ICD-10-CM | POA: Diagnosis not present

## 2020-05-06 DIAGNOSIS — M329 Systemic lupus erythematosus, unspecified: Secondary | ICD-10-CM | POA: Diagnosis not present

## 2020-05-06 DIAGNOSIS — I998 Other disorder of circulatory system: Secondary | ICD-10-CM | POA: Diagnosis not present

## 2020-05-06 DIAGNOSIS — R569 Unspecified convulsions: Secondary | ICD-10-CM | POA: Diagnosis not present

## 2020-05-06 DIAGNOSIS — K3184 Gastroparesis: Secondary | ICD-10-CM | POA: Diagnosis not present

## 2020-05-06 DIAGNOSIS — Z7982 Long term (current) use of aspirin: Secondary | ICD-10-CM | POA: Diagnosis not present

## 2020-05-06 DIAGNOSIS — E876 Hypokalemia: Secondary | ICD-10-CM | POA: Diagnosis not present

## 2020-05-06 DIAGNOSIS — R0789 Other chest pain: Secondary | ICD-10-CM | POA: Diagnosis not present

## 2020-05-06 DIAGNOSIS — Z9049 Acquired absence of other specified parts of digestive tract: Secondary | ICD-10-CM | POA: Diagnosis not present

## 2020-05-06 DIAGNOSIS — F17210 Nicotine dependence, cigarettes, uncomplicated: Secondary | ICD-10-CM | POA: Diagnosis not present

## 2020-05-06 LAB — BASIC METABOLIC PANEL
Anion gap: 15 (ref 5–15)
Anion gap: 12 (ref 5–15)
Anion gap: 16 — ABNORMAL HIGH (ref 5–15)
Anion gap: 12 (ref 5–15)
Anion gap: 10 (ref 5–15)
BUN: 9 mg/dL (ref 6–20)
BUN: 9 mg/dL (ref 6–20)
BUN: 9 mg/dL (ref 6–20)
BUN: 7 mg/dL (ref 6–20)
BUN: 7 mg/dL (ref 6–20)
CO2: 20 mmol/L — ABNORMAL LOW (ref 22–32)
CO2: 25 mmol/L (ref 22–32)
CO2: 22 mmol/L (ref 22–32)
CO2: 26 mmol/L (ref 22–32)
CO2: 24 mmol/L (ref 22–32)
Calcium: 9 mg/dL (ref 8.9–10.3)
Calcium: 9.2 mg/dL (ref 8.9–10.3)
Calcium: 9.3 mg/dL (ref 8.9–10.3)
Calcium: 9.4 mg/dL (ref 8.9–10.3)
Calcium: 8.7 mg/dL — ABNORMAL LOW (ref 8.9–10.3)
Chloride: 106 mmol/L (ref 98–111)
Chloride: 104 mmol/L (ref 98–111)
Chloride: 101 mmol/L (ref 98–111)
Chloride: 102 mmol/L (ref 98–111)
Chloride: 101 mmol/L (ref 98–111)
Creatinine, Ser: 0.48 mg/dL (ref 0.44–1.00)
Creatinine, Ser: 0.53 mg/dL (ref 0.44–1.00)
Creatinine, Ser: 0.36 mg/dL — ABNORMAL LOW (ref 0.44–1.00)
Creatinine, Ser: 0.43 mg/dL — ABNORMAL LOW (ref 0.44–1.00)
Creatinine, Ser: 0.45 mg/dL (ref 0.44–1.00)
GFR, Estimated: >60 mL/min (ref >60)
GFR, Estimated: >60 mL/min (ref >60)
GFR, Estimated: >60 mL/min (ref >60)
GFR, Estimated: >60 mL/min (ref >60)
GFR, Estimated: >60 mL/min (ref >60)
Glucose, Bld: 127 mg/dL — ABNORMAL HIGH (ref 70–99)
Glucose, Bld: 122 mg/dL — ABNORMAL HIGH (ref 70–99)
Glucose, Bld: 195 mg/dL — ABNORMAL HIGH (ref 70–99)
Glucose, Bld: 168 mg/dL — ABNORMAL HIGH (ref 70–99)
Glucose, Bld: 234 mg/dL — ABNORMAL HIGH (ref 70–99)
Potassium: 3.3 mmol/L — ABNORMAL LOW (ref 3.5–5.1)
Potassium: 3.1 mmol/L — ABNORMAL LOW (ref 3.5–5.1)
Potassium: 3.4 mmol/L — ABNORMAL LOW (ref 3.5–5.1)
Potassium: 3.2 mmol/L — ABNORMAL LOW (ref 3.5–5.1)
Potassium: 3.8 mmol/L (ref 3.5–5.1)
Sodium: 141 mmol/L (ref 135–145)
Sodium: 141 mmol/L (ref 135–145)
Sodium: 139 mmol/L (ref 135–145)
Sodium: 140 mmol/L (ref 135–145)
Sodium: 135 mmol/L (ref 135–145)

## 2020-05-06 LAB — CBG MONITORING, ED
Glucose-Capillary: 111 mg/dL — ABNORMAL HIGH (ref 70–99)
Glucose-Capillary: 119 mg/dL — ABNORMAL HIGH (ref 70–99)
Glucose-Capillary: 137 mg/dL — ABNORMAL HIGH (ref 70–99)
Glucose-Capillary: 139 mg/dL — ABNORMAL HIGH (ref 70–99)
Glucose-Capillary: 153 mg/dL — ABNORMAL HIGH (ref 70–99)
Glucose-Capillary: 153 mg/dL — ABNORMAL HIGH (ref 70–99)
Glucose-Capillary: 157 mg/dL — ABNORMAL HIGH (ref 70–99)
Glucose-Capillary: 163 mg/dL — ABNORMAL HIGH (ref 70–99)
Glucose-Capillary: 166 mg/dL — ABNORMAL HIGH (ref 70–99)
Glucose-Capillary: 172 mg/dL — ABNORMAL HIGH (ref 70–99)
Glucose-Capillary: 175 mg/dL — ABNORMAL HIGH (ref 70–99)
Glucose-Capillary: 177 mg/dL — ABNORMAL HIGH (ref 70–99)
Glucose-Capillary: 189 mg/dL — ABNORMAL HIGH (ref 70–99)
Glucose-Capillary: 189 mg/dL — ABNORMAL HIGH (ref 70–99)
Glucose-Capillary: 220 mg/dL — ABNORMAL HIGH (ref 70–99)
Glucose-Capillary: 233 mg/dL — ABNORMAL HIGH (ref 70–99)
Glucose-Capillary: 238 mg/dL — ABNORMAL HIGH (ref 70–99)
Glucose-Capillary: 254 mg/dL — ABNORMAL HIGH (ref 70–99)
Glucose-Capillary: 63 mg/dL — ABNORMAL LOW (ref 70–99)
Glucose-Capillary: 68 mg/dL — ABNORMAL LOW (ref 70–99)
Glucose-Capillary: 77 mg/dL (ref 70–99)
Glucose-Capillary: 97 mg/dL (ref 70–99)

## 2020-05-06 LAB — MAGNESIUM: Magnesium: 1.6 mg/dL — ABNORMAL LOW (ref 1.7–2.4)

## 2020-05-06 LAB — HEMOGLOBIN A1C
Hgb A1c MFr Bld: 13.9 % — ABNORMAL HIGH (ref 4.8–5.6)
Mean Plasma Glucose: 352.23 mg/dL

## 2020-05-06 LAB — BETA-HYDROXYBUTYRIC ACID
Beta-Hydroxybutyric Acid: 1.54 mmol/L — ABNORMAL HIGH (ref 0.05–0.27)
Beta-Hydroxybutyric Acid: 0.69 mmol/L — ABNORMAL HIGH (ref 0.05–0.27)
Beta-Hydroxybutyric Acid: 1.06 mmol/L — ABNORMAL HIGH (ref 0.05–0.27)

## 2020-05-06 LAB — PREGNANCY, URINE: Preg Test, Ur: NEGATIVE

## 2020-05-06 LAB — PHOSPHORUS: Phosphorus: 2.6 mg/dL (ref 2.5–4.6)

## 2020-05-06 MED ORDER — DEXTROSE 50 % IV SOLN
INTRAVENOUS | Status: AC
  Administered 2020-05-06: 20 mL via INTRAVENOUS
  Filled 2020-05-06: qty 50

## 2020-05-06 MED ORDER — DIPHENHYDRAMINE HCL 25 MG PO CAPS: 25.0000 mg | ORAL_CAPSULE | Freq: Every evening | ORAL | Status: DC | PRN

## 2020-05-06 MED ORDER — DEXTROSE 50 % IV SOLN: 15.0000 mL | INTRAVENOUS | Status: AC

## 2020-05-06 MED ORDER — DEXTROSE 50 % IV SOLN
INTRAVENOUS | Status: AC
  Filled 2020-05-06: qty 50

## 2020-05-06 MED ORDER — MAGNESIUM SULFATE 2 GM/50ML IV SOLN
2.0000 g | Freq: Once | INTRAVENOUS | Status: AC
  Administered 2020-05-06: 2 g via INTRAVENOUS
  Filled 2020-05-06: qty 50

## 2020-05-06 MED ORDER — INSULIN ASPART 100 UNIT/ML ~~LOC~~ SOLN
0.0000 [IU] | Freq: Three times a day (TID) | SUBCUTANEOUS | Status: DC
  Administered 2020-05-06 – 2020-05-07 (×2): 3 [IU] via SUBCUTANEOUS
  Administered 2020-05-07 (×2): 2 [IU] via SUBCUTANEOUS
  Filled 2020-05-06 (×4): qty 1

## 2020-05-06 MED ORDER — POTASSIUM CHLORIDE 10 MEQ/100ML IV SOLN
10.0000 meq | INTRAVENOUS | Status: AC
  Administered 2020-05-06 (×4): 10 meq via INTRAVENOUS
  Filled 2020-05-06 (×4): qty 100

## 2020-05-06 MED ORDER — INSULIN GLARGINE 100 UNIT/ML ~~LOC~~ SOLN
15.0000 [IU] | Freq: Every day | SUBCUTANEOUS | Status: DC
  Administered 2020-05-06: 15 [IU] via SUBCUTANEOUS
  Filled 2020-05-06 (×2): qty 0.15

## 2020-05-06 MED ORDER — LORAZEPAM 1 MG PO TABS
1.0000 mg | ORAL_TABLET | Freq: Four times a day (QID) | ORAL | Status: DC | PRN
  Administered 2020-05-06 – 2020-05-07 (×3): 1 mg via ORAL
  Filled 2020-05-06 (×3): qty 1

## 2020-05-06 MED ORDER — OXYCODONE HCL 5 MG PO TABS
5.0000 mg | ORAL_TABLET | Freq: Four times a day (QID) | ORAL | Status: DC | PRN
  Administered 2020-05-06 – 2020-05-07 (×3): 5 mg via ORAL
  Filled 2020-05-06 (×3): qty 1

## 2020-05-06 MED ORDER — POTASSIUM CHLORIDE 10 MEQ/100ML IV SOLN
10.0000 meq | INTRAVENOUS | Status: AC
  Administered 2020-05-06 (×2): 10 meq via INTRAVENOUS
  Filled 2020-05-06 (×2): qty 100

## 2020-05-06 NOTE — Progress Notes (Signed)
Inpatient Diabetes Program Recommendations  AACE/ADA: New Consensus Statement on Inpatient Glycemic Control (2015)  Target Ranges:  Prepandial:   less than 140 mg/dL      Peak postprandial:   less than 180 mg/dL (1-2 hours)      Critically ill patients:  140 - 180 mg/dL   Lab Results  Component Value Date   GLUCAP 163 (H) 05/06/2020   HGBA1C 11.8 (H) 01/25/2020    Review of Glycemic Control Results for Rachael Gray, Rachael Gray (MRN [id number]) as of 05/06/2020 08:25  Ref. Range 05/06/2020 06:14 05/06/2020 07:36 05/06/2020 08:37  Glucose-Capillary Latest Ref Range: 70 - 99 mg/dL 111 (H) 166 (H) 163 (H)   Diabetes history: Type 2 DM Outpatient Diabetes medications: Novolog 70/30 10 units BID (Not taking)  Current orders for Inpatient glycemic control: IV insulin  Inpatient Diabetes Program Recommendations:    Once acidosis is completely cleared and MD is ready to transition from IV to SQ insulin, consider ordering: - Lantus 10 units two hours prior to discontinuing IV insulin, then Q24H following - Novolog 0-9 units Q4H, and if diet ordered please order Novolog 3 units TID with meals for meal coverage if patient eats at least 50% of meals.  At time of discharge, please provide Rx for: 70/30 insulin vials and insulin syringes.  During previous admission for DKA on 11/3, DM coordinator spoke with patient on importance of taking medicaiton as prescribed. Of note, A1C has been >11% since 2014.  Will place TOC to help assist with PCP follow up.   Thanks, Bronson Curb, MSN, RNC-OB Diabetes Coordinator [phone] (8a-5p)

## 2020-05-06 NOTE — Progress Notes (Signed)
PROGRESS NOTE    [name]  ZJQ:[id number] DOB: 1980/08/16 DOA: 05/05/2020 PCP: Patient, No Pcp Per   Chief complaint: shortness of breath and anxiety Brief Narrative:  Rachael Gray is a 40 y.o. female with medical history significant of anemia, marijuana use, tobacco use, diabetes, recurrent DKA, gastroparesis, psychogenic nonepileptic seizure, history of troponin elevation presents with 1 to 2 days of feeling unwell and also having chest pain shortness of breath and anxiety as above. Upon arriving the emergency room, patient was found to have glucose of 313, bicarb 19, beta hydroxybutyrate acid of 4.63. She is diagnosed with DKA, she is placed on IV fluids and an insulin drip.   Assessment & Plan:   Principal Problem:   DKA (diabetic ketoacidosis) ([name]) Active Problems:   Anxiety   Gastroparesis due to DM ([name])   Diabetes ([name])   Tobacco abuse disorder   Psychogenic nonepileptic seizure   Cannabis use disorder, moderate, dependence ([name])  #1. Uncontrolled type 2 diabetes with diabetic ketoacidosis. Patient has been receiving IV fluids, condition seem to be improving. Bicarbonate has normalized, if beta hydroxybutyrate acid is normalized, will discontinue fluids and transition to subcu insulin. For now, will start a diet. She also has significant abdominal pain, will start as needed oxycodone.  2. Severe anxiety. Psychogenic nonepileptic seizures. I will start some as needed Ativan.  3. Diabetic gastroparesis. Patient was started on IV Reglan, will continue.  #4. Hypomagnesemia. Hypokalemia Supplement    DVT prophylaxis: Lovenox Code Status: Full Family Communication:  Disposition Plan:  .   Status is: Observation  The patient will require care spanning > 2 midnights and should be moved to inpatient because: Inpatient level of care appropriate due to severity of illness  Dispo: The patient is from: Home              Anticipated d/c is to: Home               Anticipated d/c date is: 1 day              Patient currently is not medically stable to d/c.   Difficult to place patient No        I/O last 3 completed shifts: In: 200 [IV Piggyback:200] Out: -  Total I/O In: 200 [IV Piggyback:200] Out: -      Consultants:   None  Procedures: None  Antimicrobials: None  Subjective: Has a severe anxiety, tachycardia, shaking. She is also complaining of abdominal cramping pain, no nausea vomiting. She had a normal bowel movement yesterday. Denies any short of breath today, no cough. No fever chills. No dysuria or hematuria.  Objective: Vitals:   05/06/20 0844 05/06/20 0900 05/06/20 1100 05/06/20 1214  BP: 124/72 (!) 157/95 (!) 146/86 (!) 132/92  Pulse: 97 (!) 115 (!) 122 (!) 128  Resp: (!) 22  20 20   Temp: 98.9 F (37.2 C)     TempSrc: Oral     SpO2: 100% 98% 94% 99%  Weight:      Height:        Intake/Output Summary (Last 24 hours) at 05/06/2020 1302 Last data filed at 05/06/2020 1036 Gross per 24 hour  Intake 399.97 ml  Output --  Net 399.97 ml   Filed Weights   05/05/20 1737  Weight: 60 kg    Examination:  General exam: Appears calm and comfortable  Respiratory system: Clear to auscultation. Respiratory effort normal. Cardiovascular system: S1 & S2 heard, RRR. No JVD, murmurs, rubs,  gallops or clicks. No pedal edema. Gastrointestinal system: Abdomen is nondistended, soft and mildly tender. No organomegaly or masses felt. Normal bowel sounds heard. Central nervous system: Alert and oriented. No focal neurological deficits. Extremities: Symmetric 5 x 5 power. Skin: No rashes, lesions or ulcers Psychiatry: Very anxious.    Data Reviewed: I have personally reviewed following labs and imaging studies  CBC: Recent Labs  Lab 05/05/20 2000  WBC 14.6*  NEUTROABS 12.4*  HGB 12.5  HCT 37.7  MCV 84.2  PLT 967   Basic Metabolic Panel: Recent Labs  Lab 05/05/20 1848 05/06/20 0046 05/06/20 0335  05/06/20 0734 05/06/20 0741  NA 135 141 141  --  139  K 3.8 3.3* 3.1*  --  3.4*  CL 97* 106 104  --  101  CO2 19* 20* 25  --  22  GLUCOSE 313* 127* 122*  --  195*  BUN 11 9 9   --  9  CREATININE 0.66 0.48 0.53  --  0.36*  CALCIUM 9.8 9.0 9.2  --  9.3  MG  --   --   --  1.6*  --   PHOS  --   --   --  2.6  --    GFR: Estimated Creatinine Clearance: 85.8 mL/min (A) (by C-G formula based on SCr of 0.36 mg/dL (L)). Liver Function Tests: Recent Labs  Lab 05/05/20 1848  AST 16  ALT 13  ALKPHOS 55  BILITOT 1.4*  PROT 8.4*  ALBUMIN 4.2   Recent Labs  Lab 05/05/20 2059  LIPASE 46   No results for input(s): AMMONIA in the last 168 hours. Coagulation Profile: No results for input(s): INR, PROTIME in the last 168 hours. Cardiac Enzymes: Recent Labs  Lab 05/05/20 1755  CKTOTAL 30*   BNP (last 3 results) No results for input(s): PROBNP in the last 8760 hours. HbA1C: Recent Labs    05/06/20 0335  HGBA1C 13.9*   CBG: Recent Labs  Lab 05/06/20 0736 05/06/20 0837 05/06/20 0931 05/06/20 1103 05/06/20 1216  GLUCAP 166* 163* 139* 153* 157*   Lipid Profile: No results for input(s): CHOL, HDL, LDLCALC, TRIG, CHOLHDL, LDLDIRECT in the last 72 hours. Thyroid Function Tests: No results for input(s): TSH, T4TOTAL, FREET4, T3FREE, THYROIDAB in the last 72 hours. Anemia Panel: No results for input(s): VITAMINB12, FOLATE, FERRITIN, TIBC, IRON, RETICCTPCT in the last 72 hours. Sepsis Labs: No results for input(s): PROCALCITON, LATICACIDVEN in the last 168 hours.  Recent Results (from the past 240 hour(s))  Resp Panel by RT-PCR (Flu A&B, Covid) Nasopharyngeal Swab     Status: None   Collection Time: 05/05/20  7:55 PM   Specimen: Nasopharyngeal Swab; Nasopharyngeal(NP) swabs in vial transport medium  Result Value Ref Range Status   SARS Coronavirus 2 by RT PCR NEGATIVE NEGATIVE Final    Comment: (NOTE) SARS-CoV-2 target nucleic acids are NOT DETECTED.  The SARS-CoV-2 RNA is  generally detectable in upper respiratory specimens during the acute phase of infection. The lowest concentration of SARS-CoV-2 viral copies this assay can detect is 138 copies/mL. A negative result does not preclude SARS-Cov-2 infection and should not be used as the sole basis for treatment or other patient management decisions. A negative result may occur with  improper specimen collection/handling, submission of specimen other than nasopharyngeal swab, presence of viral mutation(s) within the areas targeted by this assay, and inadequate number of viral copies(<138 copies/mL). A negative result must be combined with clinical observations, patient history, and epidemiological information. The expected  result is Negative.  Fact Sheet for Patients:  EntrepreneurPulse.com.au  Fact Sheet for Healthcare Providers:  IncredibleEmployment.be  This test is no t yet approved or cleared by the [place] FDA and  has been authorized for detection and/or diagnosis of SARS-CoV-2 by FDA under an Emergency Use Authorization (EUA). This EUA will remain  in effect (meaning this test can be used) for the duration of the COVID-19 declaration under Section 564(b)(1) of the Act, 21 U.S.C.section 360bbb-3(b)(1), unless the authorization is terminated  or revoked sooner.       Influenza A by PCR NEGATIVE NEGATIVE Final   Influenza B by PCR NEGATIVE NEGATIVE Final    Comment: (NOTE) The Xpert Xpress SARS-CoV-2/FLU/RSV plus assay is intended as an aid in the diagnosis of influenza from Nasopharyngeal swab specimens and should not be used as a sole basis for treatment. Nasal washings and aspirates are unacceptable for Xpert Xpress SARS-CoV-2/FLU/RSV testing.  Fact Sheet for Patients: EntrepreneurPulse.com.au  Fact Sheet for Healthcare Providers: IncredibleEmployment.be  This test is not yet approved or cleared by the [place] FDA and has been authorized for detection and/or diagnosis of SARS-CoV-2 by FDA under an Emergency Use Authorization (EUA). This EUA will remain in effect (meaning this test can be used) for the duration of the COVID-19 declaration under Section 564(b)(1) of the Act, 21 U.S.C. section 360bbb-3(b)(1), unless the authorization is terminated or revoked.  Performed at [facility], [address] [place], [place] [address]          Radiology Studies: DG Chest Portable 1 View  Result Date: 05/05/2020 CLINICAL DATA:  Chest pain. EXAM: PORTABLE CHEST 1 VIEW COMPARISON:  Radiograph 08/15/2018 FINDINGS: The cardiomediastinal contours are normal. The lungs are clear. Pulmonary vasculature is normal. No consolidation, pleural effusion, or pneumothorax. No acute osseous abnormalities are seen. IMPRESSION: Negative AP view of the chest. Electronically Signed   By: Keith Rake M.D.   On: 05/05/2020 19:15        Scheduled Meds: . aspirin EC  81 mg Oral Daily  . atorvastatin  40 mg Oral QHS  . enoxaparin (LOVENOX) injection  40 mg Subcutaneous Q24H  . metoprolol tartrate  12.5 mg Oral BID  . pantoprazole  40 mg Oral BID   Continuous Infusions: . dextrose 5% lactated ringers 125 mL/hr at 05/06/20 0840  . insulin 1.5 Units/hr (05/06/20 1105)  . lactated ringers Stopped (05/05/20 2343)  . magnesium sulfate bolus IVPB    . potassium chloride 10 mEq (05/06/20 1256)     LOS: 0 days    Time spent: 28 minutes    Sharen Hones, MD [facility]   To contact the attending provider between 7A-7P or the covering provider during after hours 7P-7A, please log into the web site [email or web address] and access using universal [place] password for that web site. If you do not have the password, please call the hospital operator.  05/06/2020, 1:02 PM

## 2020-05-06 NOTE — ED Notes (Signed)
Blood sugar rechecked and was 77

## 2020-05-06 NOTE — ED Notes (Signed)
Pt very anxious, rocking straight up and down on bed.  VS as documented.  Continuing to monitor.

## 2020-05-06 NOTE — TOC Progression Note (Signed)
Transition of Care [facility]) - Progression Note    Patient Details  Name: Rachael Gray MRN: [id number] Date of Birth: Apr 22, 1980  Transition of Care [facility]) CM/SW [place], LCSW Phone Number: 05/06/2020, 5:02 PM  Clinical Narrative:   Patient provided with [facility] list/ Primary Care Provider List.          Expected Discharge Plan and Services     Social Determinants of Health (SDOH) Interventions    Readmission Risk Interventions No flowsheet data found.

## 2020-05-06 NOTE — ED Notes (Signed)
Pt given two warm blankets.  

## 2020-05-06 NOTE — ED Notes (Signed)
Np morrison notified about Blood glucose of 63. Pt to be given 57ml of d50 per endotool.

## 2020-05-06 NOTE — ED Notes (Signed)
Np Morrison at bedside insulin gtt discontinued at this time will start subcutaneous insulin

## 2020-05-06 NOTE — ED Notes (Signed)
Providers contacted informing them that insulin drip was stopped per Endo tool guidelines and requesting clarification for whether or not patient should be receiving a long acting insulin now. RN awaiting response.

## 2020-05-07 DIAGNOSIS — E1143 Type 2 diabetes mellitus with diabetic autonomic (poly)neuropathy: Secondary | ICD-10-CM | POA: Diagnosis not present

## 2020-05-07 DIAGNOSIS — K3184 Gastroparesis: Secondary | ICD-10-CM | POA: Diagnosis not present

## 2020-05-07 DIAGNOSIS — E1110 Type 2 diabetes mellitus with ketoacidosis without coma: Secondary | ICD-10-CM | POA: Diagnosis not present

## 2020-05-07 DIAGNOSIS — F445 Conversion disorder with seizures or convulsions: Secondary | ICD-10-CM

## 2020-05-07 LAB — BASIC METABOLIC PANEL
Anion gap: 10 (ref 5–15)
Anion gap: 8 (ref 5–15)
BUN: 5 mg/dL — ABNORMAL LOW (ref 6–20)
BUN: 6 mg/dL (ref 6–20)
CO2: 24 mmol/L (ref 22–32)
CO2: 23 mmol/L (ref 22–32)
Calcium: 8.8 mg/dL — ABNORMAL LOW (ref 8.9–10.3)
Calcium: 8.8 mg/dL — ABNORMAL LOW (ref 8.9–10.3)
Chloride: 102 mmol/L (ref 98–111)
Chloride: 103 mmol/L (ref 98–111)
Creatinine, Ser: 0.42 mg/dL — ABNORMAL LOW (ref 0.44–1.00)
Creatinine, Ser: 0.45 mg/dL (ref 0.44–1.00)
GFR, Estimated: >60 mL/min (ref >60)
GFR, Estimated: >60 mL/min (ref >60)
Glucose, Bld: 108 mg/dL — ABNORMAL HIGH (ref 70–99)
Glucose, Bld: 281 mg/dL — ABNORMAL HIGH (ref 70–99)
Potassium: 3 mmol/L — ABNORMAL LOW (ref 3.5–5.1)
Potassium: 3.6 mmol/L (ref 3.5–5.1)
Sodium: 136 mmol/L (ref 135–145)
Sodium: 134 mmol/L — ABNORMAL LOW (ref 135–145)

## 2020-05-07 LAB — CBC WITH DIFFERENTIAL/PLATELET
Abs Immature Granulocytes: 0.02 10*3/uL (ref 0.00–0.07)
Basophils Absolute: 0 10*3/uL (ref 0.0–0.1)
Basophils Relative: 0 %
Eosinophils Absolute: 0 10*3/uL (ref 0.0–0.5)
Eosinophils Relative: 0 %
HCT: 32.3 % — ABNORMAL LOW (ref 36.0–46.0)
Hemoglobin: 10.5 g/dL — ABNORMAL LOW (ref 12.0–15.0)
Immature Granulocytes: 0 %
Lymphocytes Relative: 39 %
Lymphs Abs: 2.8 10*3/uL (ref 0.7–4.0)
MCH: 27.9 pg (ref 26.0–34.0)
MCHC: 32.5 g/dL (ref 30.0–36.0)
MCV: 85.9 fL (ref 80.0–100.0)
Monocytes Absolute: 0.5 10*3/uL (ref 0.1–1.0)
Monocytes Relative: 7 %
Neutro Abs: 3.9 10*3/uL (ref 1.7–7.7)
Neutrophils Relative %: 54 %
Platelets: 240 10*3/uL (ref 150–400)
RBC: 3.76 MIL/uL — ABNORMAL LOW (ref 3.87–5.11)
RDW: 13.7 % (ref 11.5–15.5)
WBC: 7.3 10*3/uL (ref 4.0–10.5)
nRBC: 0 % (ref 0.0–0.2)

## 2020-05-07 LAB — GLUCOSE, CAPILLARY
Glucose-Capillary: 192 mg/dL — ABNORMAL HIGH (ref 70–99)
Glucose-Capillary: 149 mg/dL — ABNORMAL HIGH (ref 70–99)
Glucose-Capillary: 109 mg/dL — ABNORMAL HIGH (ref 70–99)

## 2020-05-07 LAB — PHOSPHORUS: Phosphorus: 3.2 mg/dL (ref 2.5–4.6)

## 2020-05-07 LAB — BETA-HYDROXYBUTYRIC ACID
Beta-Hydroxybutyric Acid: 0.95 mmol/L — ABNORMAL HIGH (ref 0.05–0.27)
Beta-Hydroxybutyric Acid: 0.11 mmol/L (ref 0.05–0.27)

## 2020-05-07 LAB — MAGNESIUM: Magnesium: 1.7 mg/dL (ref 1.7–2.4)

## 2020-05-07 LAB — CBG MONITORING, ED
Glucose-Capillary: 90 mg/dL (ref 70–99)
Glucose-Capillary: 133 mg/dL — ABNORMAL HIGH (ref 70–99)

## 2020-05-07 MED ORDER — POTASSIUM CHLORIDE 2 MEQ/ML IV SOLN
INTRAVENOUS | Status: DC
  Filled 2020-05-07 (×4): qty 1000

## 2020-05-07 MED ORDER — INSULIN GLARGINE 100 UNIT/ML ~~LOC~~ SOLN
16.0000 [IU] | Freq: Every day | SUBCUTANEOUS | Status: DC
  Administered 2020-05-07: 13:00:00 16 [IU] via SUBCUTANEOUS
  Filled 2020-05-07: qty 0.16

## 2020-05-07 MED ORDER — KCL-LACTATED RINGERS 20 MEQ/L IV SOLN
INTRAVENOUS | Status: DC
  Filled 2020-05-07 (×6): qty 1000

## 2020-05-07 MED ORDER — FENTANYL CITRATE (PF) 100 MCG/2ML IJ SOLN
12.5000 ug | Freq: Once | INTRAMUSCULAR | Status: AC
  Administered 2020-05-07: 12.5 ug via INTRAVENOUS
  Filled 2020-05-07: qty 2

## 2020-05-07 MED ORDER — POTASSIUM CHLORIDE 10 MEQ/100ML IV SOLN
10.0000 meq | INTRAVENOUS | Status: DC
  Administered 2020-05-07: 11:00:00 10 meq via INTRAVENOUS
  Filled 2020-05-07: qty 100

## 2020-05-07 MED ORDER — INSULIN GLARGINE 100 UNIT/ML ~~LOC~~ SOLN
16.0000 [IU] | Freq: Two times a day (BID) | SUBCUTANEOUS | Status: DC
  Filled 2020-05-07 (×2): qty 0.16

## 2020-05-07 MED ORDER — LORAZEPAM 2 MG/ML IJ SOLN
1.0000 mg | Freq: Once | INTRAMUSCULAR | Status: AC
  Administered 2020-05-07: 1 mg via INTRAVENOUS
  Filled 2020-05-07: qty 1

## 2020-05-07 NOTE — ED Notes (Signed)
bg 133

## 2020-05-07 NOTE — ED Notes (Signed)
Patient noted to be sleeping, NAD noted, VSS.

## 2020-05-07 NOTE — ED Notes (Signed)
Pharmacy called for IVF, pharmacy to send.

## 2020-05-07 NOTE — ED Notes (Signed)
Patient noted to be sleeping on right side. NAD noted, VSS.

## 2020-05-07 NOTE — ED Notes (Signed)
Lab called to draw AM labs. This RN attempted to pull off IV's and straight stick patient prior to calling.

## 2020-05-07 NOTE — Plan of Care (Signed)
  Problem: Activity: Goal: Risk for activity intolerance will decrease Outcome: Progressing   Problem: Pain Managment: Goal: General experience of comfort will improve Outcome: Progressing   Problem: Safety: Goal: Ability to remain free from injury will improve Outcome: Progressing   

## 2020-05-07 NOTE — Progress Notes (Signed)
PROGRESS NOTE    Rachael Gray  XVQ:[id number] DOB: 11/21/1980 DOA: 05/05/2020 PCP: Patient, No Pcp Per    Brief Narrative:  Rachael Gray a 40 y.o.femalewith medical history significant ofanemia, marijuana use, tobacco use, diabetes, recurrent DKA, gastroparesis, psychogenic nonepileptic seizure, history of troponin elevation presents with 1 to 2 days of feeling unwell and also having chest pain shortness of breath and anxiety as above. Upon arriving the emergency room, patient was found to have glucose of 313, bicarb 19, beta hydroxybutyrate acid of 4.63. She is diagnosed with DKA, she is placed on IV fluids and an insulin drip.   Assessment & Plan:   Principal Problem:   DKA (diabetic ketoacidosis) ([place]) Active Problems:   Anxiety   Gastroparesis due to DM ([place])   Diabetes ([place])   Tobacco abuse disorder   Psychogenic nonepileptic seizure   Cannabis use disorder, moderate, dependence ([place])  #1. Uncontrolled type 2 diabetes with diabetic ketoacidosis. Insulin drip was discontinued last night for insulin drip protocol.  She received a dose of Lantus yesterday afternoon, will restart Lantus today.  Patient clinically improved.  I will continue to follow her for another day before discharge. HbA1C 13.9.  2.  Severe anxiety. Psychogenic nonepileptic seizures. Patient is on as needed Ativan.  3.  Diabetic gastroparesis. Patient currently receiving IV Reglan.  We will continue for now.  Plan to discontinue at time of discharge per  4.  Hypomagnesemia. Hypokalemia. Continue supplements.     DVT prophylaxis: Lovenox Code Status: Full Family Communication:  Disposition Plan:  .   Status is: Inpatient  Remains inpatient appropriate because:Inpatient level of care appropriate due to severity of illness   Dispo: The patient is from: Home              Anticipated d/c is to: Home              Anticipated d/c date is: 1 day              Patient currently is  not medically stable to d/c.   Difficult to place patient No        I/O last 3 completed shifts: In: 400 [IV Piggyback:400] Out: -  No intake/output data recorded.     Consultants:   None  Procedures: None  Antimicrobials: None  Subjective: Patient condition much improved today.  She is tolerating diet without nausea vomiting.  Abdominal pain essentially resolved. No fever chills per No shortness breath or cough. No chest pain or palpitation.  Objective: Vitals:   05/07/20 0900 05/07/20 0905 05/07/20 1012 05/07/20 1039  BP: 110/69 110/69 100/67 114/78  Pulse: 98  86 90  Resp: 15  15 18   Temp:    98.9 F (37.2 C)  TempSrc:      SpO2: 99%  100% 100%  Weight:      Height:       No intake or output data in the 24 hours ending 05/07/20 1305 Filed Weights   05/05/20 1737  Weight: 60 kg    Examination:  General exam: Appears calm and comfortable  Respiratory system: Clear to auscultation. Respiratory effort normal. Cardiovascular system: S1 & S2 heard, RRR. No JVD, murmurs, rubs, gallops or clicks. No pedal edema. Gastrointestinal system: Abdomen is nondistended, soft and nontender. No organomegaly or masses felt. Normal bowel sounds heard. Central nervous system: Alert and oriented. No focal neurological deficits. Extremities: Symmetric 5 x 5 power. Skin: No rashes, lesions or ulcers Psychiatry: Judgement and insight  appear normal. Mood & affect appropriate.     Data Reviewed: I have personally reviewed following labs and imaging studies  CBC: Recent Labs  Lab 05/05/20 2000 05/07/20 0730  WBC 14.6* 7.3  NEUTROABS 12.4* 3.9  HGB 12.5 10.5*  HCT 37.7 32.3*  MCV 84.2 85.9  PLT 307 967   Basic Metabolic Panel: Recent Labs  Lab 05/06/20 0335 05/06/20 0734 05/06/20 0741 05/06/20 1219 05/06/20 1617 05/07/20 0730  NA 141  --  139 140 135 136  K 3.1*  --  3.4* 3.2* 3.8 3.0*  CL 104  --  101 102 101 102  CO2 25  --  22 26 24 24   GLUCOSE 122*  --   195* 168* 234* 108*  BUN 9  --  9 7 7  5*  CREATININE 0.53  --  0.36* 0.43* 0.45 0.42*  CALCIUM 9.2  --  9.3 9.4 8.7* 8.8*  MG  --  1.6*  --   --   --  1.7  PHOS  --  2.6  --   --   --  3.2   GFR: Estimated Creatinine Clearance: 85.8 mL/min (A) (by C-G formula based on SCr of 0.42 mg/dL (L)). Liver Function Tests: Recent Labs  Lab 05/05/20 1848  AST 16  ALT 13  ALKPHOS 55  BILITOT 1.4*  PROT 8.4*  ALBUMIN 4.2   Recent Labs  Lab 05/05/20 2059  LIPASE 46   No results for input(s): AMMONIA in the last 168 hours. Coagulation Profile: No results for input(s): INR, PROTIME in the last 168 hours. Cardiac Enzymes: Recent Labs  Lab 05/05/20 1755  CKTOTAL 30*   BNP (last 3 results) No results for input(s): PROBNP in the last 8760 hours. HbA1C: Recent Labs    05/06/20 0335  HGBA1C 13.9*   CBG: Recent Labs  Lab 05/06/20 2111 05/06/20 2210 05/07/20 0435 05/07/20 0840 05/07/20 1129  GLUCAP 153* 175* 90 133* 192*   Lipid Profile: No results for input(s): CHOL, HDL, LDLCALC, TRIG, CHOLHDL, LDLDIRECT in the last 72 hours. Thyroid Function Tests: No results for input(s): TSH, T4TOTAL, FREET4, T3FREE, THYROIDAB in the last 72 hours. Anemia Panel: No results for input(s): VITAMINB12, FOLATE, FERRITIN, TIBC, IRON, RETICCTPCT in the last 72 hours. Sepsis Labs: No results for input(s): PROCALCITON, LATICACIDVEN in the last 168 hours.  Recent Results (from the past 240 hour(s))  Resp Panel by RT-PCR (Flu A&B, Covid) Nasopharyngeal Swab     Status: None   Collection Time: 05/05/20  7:55 PM   Specimen: Nasopharyngeal Swab; Nasopharyngeal(NP) swabs in vial transport medium  Result Value Ref Range Status   SARS Coronavirus 2 by RT PCR NEGATIVE NEGATIVE Final    Comment: (NOTE) SARS-CoV-2 target nucleic acids are NOT DETECTED.  The SARS-CoV-2 RNA is generally detectable in upper respiratory specimens during the acute phase of infection. The lowest concentration of  SARS-CoV-2 viral copies this assay can detect is 138 copies/mL. A negative result does not preclude SARS-Cov-2 infection and should not be used as the sole basis for treatment or other patient management decisions. A negative result may occur with  improper specimen collection/handling, submission of specimen other than nasopharyngeal swab, presence of viral mutation(s) within the areas targeted by this assay, and inadequate number of viral copies(<138 copies/mL). A negative result must be combined with clinical observations, patient history, and epidemiological information. The expected result is Negative.  Fact Sheet for Patients:  EntrepreneurPulse.com.au  Fact Sheet for Healthcare Providers:  IncredibleEmployment.be  This test is  no t yet approved or cleared by the [place] and  has been authorized for detection and/or diagnosis of SARS-CoV-2 by FDA under an Emergency Use Authorization (EUA). This EUA will remain  in effect (meaning this test can be used) for the duration of the COVID-19 declaration under Section 564(b)(1) of the Act, 21 U.S.C.section 360bbb-3(b)(1), unless the authorization is terminated  or revoked sooner.       Influenza A by PCR NEGATIVE NEGATIVE Final   Influenza B by PCR NEGATIVE NEGATIVE Final    Comment: (NOTE) The Xpert Xpress SARS-CoV-2/FLU/RSV plus assay is intended as an aid in the diagnosis of influenza from Nasopharyngeal swab specimens and should not be used as a sole basis for treatment. Nasal washings and aspirates are unacceptable for Xpert Xpress SARS-CoV-2/FLU/RSV testing.  Fact Sheet for Patients: EntrepreneurPulse.com.au  Fact Sheet for Healthcare Providers: IncredibleEmployment.be  This test is not yet approved or cleared by the [place] FDA and has been authorized for detection and/or diagnosis of SARS-CoV-2 by FDA under an Emergency Use  Authorization (EUA). This EUA will remain in effect (meaning this test can be used) for the duration of the COVID-19 declaration under Section 564(b)(1) of the Act, 21 U.S.C. section 360bbb-3(b)(1), unless the authorization is terminated or revoked.  Performed at [facility], [address], [place], [place] [address]          Radiology Studies: DG Chest Portable 1 View  Result Date: 05/05/2020 CLINICAL DATA:  Chest pain. EXAM: PORTABLE CHEST 1 VIEW COMPARISON:  Radiograph 08/15/2018 FINDINGS: The cardiomediastinal contours are normal. The lungs are clear. Pulmonary vasculature is normal. No consolidation, pleural effusion, or pneumothorax. No acute osseous abnormalities are seen. IMPRESSION: Negative AP view of the chest. Electronically Signed   By: Keith Rake M.D.   On: 05/05/2020 19:15        Scheduled Meds: . aspirin EC  81 mg Oral Daily  . atorvastatin  40 mg Oral QHS  . dextrose  15 mL Intravenous STAT  . insulin aspart  0-15 Units Subcutaneous TID AC & HS  . insulin glargine  16 Units Subcutaneous Daily  . metoprolol tartrate  12.5 mg Oral BID  . pantoprazole  40 mg Oral BID   Continuous Infusions: . lactated ringers with kcl 125 mL/hr at 05/07/20 1102     LOS: 1 day    Time spent: 28 minutes    Sharen Hones, MD [facility]   To contact the attending provider between 7A-7P or the covering provider during after hours 7P-7A, please log into the web site [email or web address] and access using universal [place] password for that web site. If you do not have the password, please call the hospital operator.  05/07/2020, 1:05 PM

## 2020-05-07 NOTE — ED Notes (Addendum)
Patient continually calling out requesting pain medicine other than the prescribed PO pain medication. Patient states "it doesn't work for me and it makes me fell sick".  Rachael Fee, NP informed.  Patient informed that this RN has messaged NP and requested medication.  Patient requesting apple juice and is agreeing to a blood sugar check prior to this RN being able to give her juice.

## 2020-05-07 NOTE — Progress Notes (Signed)
AC Rachael Gray in to see pt. Rachael notified NP Hassan Rowan of pts wish to leave.  Pt removed saline lock herself, signed AMA paperwork. Refused to let me transport her to the door or walk with her. Daughter at her side.  No distress noted other than crying and mad.

## 2020-05-08 NOTE — Discharge Summary (Signed)
Patient left AMA last night.

## 2020-06-26 ENCOUNTER — Inpatient Hospital Stay: Payer: Medicaid Other

## 2020-06-26 ENCOUNTER — Emergency Department: Payer: Medicaid Other

## 2020-06-26 ENCOUNTER — Other Ambulatory Visit: Payer: Self-pay

## 2020-06-26 ENCOUNTER — Inpatient Hospital Stay
Admission: EM | Admit: 2020-06-26 | Discharge: 2020-06-30 | DRG: 639 | Disposition: A | Discharge status: Home / Self Care | Payer: Medicaid Other | Attending: Internal Medicine | Admitting: Internal Medicine

## 2020-06-26 DIAGNOSIS — R251 Tremor, unspecified: Secondary | ICD-10-CM | POA: Diagnosis not present

## 2020-06-26 DIAGNOSIS — K209 Esophagitis, unspecified without bleeding: Secondary | ICD-10-CM | POA: Diagnosis not present

## 2020-06-26 DIAGNOSIS — K3184 Gastroparesis: Secondary | ICD-10-CM | POA: Diagnosis present

## 2020-06-26 DIAGNOSIS — Z794 Long term (current) use of insulin: Secondary | ICD-10-CM

## 2020-06-26 DIAGNOSIS — R748 Abnormal levels of other serum enzymes: Secondary | ICD-10-CM | POA: Diagnosis not present

## 2020-06-26 DIAGNOSIS — R7401 Elevation of levels of liver transaminase levels: Secondary | ICD-10-CM | POA: Diagnosis not present

## 2020-06-26 DIAGNOSIS — Z885 Allergy status to narcotic agent status: Secondary | ICD-10-CM

## 2020-06-26 DIAGNOSIS — Z881 Allergy status to other antibiotic agents status: Secondary | ICD-10-CM | POA: Diagnosis not present

## 2020-06-26 DIAGNOSIS — E876 Hypokalemia: Secondary | ICD-10-CM | POA: Diagnosis present

## 2020-06-26 DIAGNOSIS — E1010 Type 1 diabetes mellitus with ketoacidosis without coma: Secondary | ICD-10-CM | POA: Diagnosis not present

## 2020-06-26 DIAGNOSIS — K2100 Gastro-esophageal reflux disease with esophagitis, without bleeding: Secondary | ICD-10-CM | POA: Diagnosis not present

## 2020-06-26 DIAGNOSIS — F445 Conversion disorder with seizures or convulsions: Secondary | ICD-10-CM | POA: Diagnosis not present

## 2020-06-26 DIAGNOSIS — F419 Anxiety disorder, unspecified: Secondary | ICD-10-CM | POA: Diagnosis present

## 2020-06-26 DIAGNOSIS — R0602 Shortness of breath: Secondary | ICD-10-CM | POA: Diagnosis not present

## 2020-06-26 DIAGNOSIS — F17210 Nicotine dependence, cigarettes, uncomplicated: Secondary | ICD-10-CM | POA: Diagnosis present

## 2020-06-26 DIAGNOSIS — E11649 Type 2 diabetes mellitus with hypoglycemia without coma: Secondary | ICD-10-CM | POA: Diagnosis not present

## 2020-06-26 DIAGNOSIS — R569 Unspecified convulsions: Secondary | ICD-10-CM | POA: Diagnosis present

## 2020-06-26 DIAGNOSIS — R112 Nausea with vomiting, unspecified: Secondary | ICD-10-CM | POA: Diagnosis not present

## 2020-06-26 DIAGNOSIS — E1143 Type 2 diabetes mellitus with diabetic autonomic (poly)neuropathy: Secondary | ICD-10-CM | POA: Diagnosis not present

## 2020-06-26 DIAGNOSIS — Z886 Allergy status to analgesic agent status: Secondary | ICD-10-CM | POA: Diagnosis not present

## 2020-06-26 DIAGNOSIS — Z79899 Other long term (current) drug therapy: Secondary | ICD-10-CM

## 2020-06-26 DIAGNOSIS — K3189 Other diseases of stomach and duodenum: Secondary | ICD-10-CM | POA: Diagnosis not present

## 2020-06-26 DIAGNOSIS — D1803 Hemangioma of intra-abdominal structures: Secondary | ICD-10-CM | POA: Diagnosis not present

## 2020-06-26 DIAGNOSIS — R1013 Epigastric pain: Secondary | ICD-10-CM | POA: Diagnosis not present

## 2020-06-26 DIAGNOSIS — Z888 Allergy status to other drugs, medicaments and biological substances status: Secondary | ICD-10-CM

## 2020-06-26 DIAGNOSIS — K319 Disease of stomach and duodenum, unspecified: Secondary | ICD-10-CM

## 2020-06-26 DIAGNOSIS — Z9049 Acquired absence of other specified parts of digestive tract: Secondary | ICD-10-CM

## 2020-06-26 DIAGNOSIS — R000 Tachycardia, unspecified: Secondary | ICD-10-CM | POA: Diagnosis not present

## 2020-06-26 DIAGNOSIS — Z91138 Patient's unintentional underdosing of medication regimen for other reason: Secondary | ICD-10-CM

## 2020-06-26 DIAGNOSIS — R7989 Other specified abnormal findings of blood chemistry: Secondary | ICD-10-CM | POA: Diagnosis not present

## 2020-06-26 DIAGNOSIS — Z20822 Contact with and (suspected) exposure to covid-19: Secondary | ICD-10-CM | POA: Diagnosis present

## 2020-06-26 DIAGNOSIS — M329 Systemic lupus erythematosus, unspecified: Secondary | ICD-10-CM | POA: Diagnosis present

## 2020-06-26 DIAGNOSIS — T383X6A Underdosing of insulin and oral hypoglycemic [antidiabetic] drugs, initial encounter: Secondary | ICD-10-CM | POA: Diagnosis present

## 2020-06-26 DIAGNOSIS — R933 Abnormal findings on diagnostic imaging of other parts of digestive tract: Secondary | ICD-10-CM

## 2020-06-26 DIAGNOSIS — R109 Unspecified abdominal pain: Secondary | ICD-10-CM | POA: Diagnosis not present

## 2020-06-26 DIAGNOSIS — E1110 Type 2 diabetes mellitus with ketoacidosis without coma: Principal | ICD-10-CM | POA: Diagnosis present

## 2020-06-26 DIAGNOSIS — E1069 Type 1 diabetes mellitus with other specified complication: Secondary | ICD-10-CM | POA: Diagnosis not present

## 2020-06-26 DIAGNOSIS — D649 Anemia, unspecified: Secondary | ICD-10-CM | POA: Diagnosis not present

## 2020-06-26 DIAGNOSIS — R188 Other ascites: Secondary | ICD-10-CM | POA: Diagnosis not present

## 2020-06-26 LAB — BASIC METABOLIC PANEL
Anion gap: 20 — ABNORMAL HIGH (ref 5–15)
Anion gap: 11 (ref 5–15)
Anion gap: 14 (ref 5–15)
BUN: 16 mg/dL (ref 6–20)
BUN: 16 mg/dL (ref 6–20)
BUN: 19 mg/dL (ref 6–20)
CO2: 21 mmol/L — ABNORMAL LOW (ref 22–32)
CO2: 28 mmol/L (ref 22–32)
CO2: 26 mmol/L (ref 22–32)
Calcium: 9.9 mg/dL (ref 8.9–10.3)
Calcium: 9.6 mg/dL (ref 8.9–10.3)
Calcium: 9.4 mg/dL (ref 8.9–10.3)
Chloride: 95 mmol/L — ABNORMAL LOW (ref 98–111)
Chloride: 97 mmol/L — ABNORMAL LOW (ref 98–111)
Chloride: 94 mmol/L — ABNORMAL LOW (ref 98–111)
Creatinine, Ser: 0.75 mg/dL (ref 0.44–1.00)
Creatinine, Ser: 0.72 mg/dL (ref 0.44–1.00)
Creatinine, Ser: 0.88 mg/dL (ref 0.44–1.00)
GFR, Estimated: >60 mL/min (ref >60)
GFR, Estimated: >60 mL/min (ref >60)
GFR, Estimated: >60 mL/min (ref >60)
Glucose, Bld: 145 mg/dL — ABNORMAL HIGH (ref 70–99)
Glucose, Bld: 128 mg/dL — ABNORMAL HIGH (ref 70–99)
Glucose, Bld: 66 mg/dL — ABNORMAL LOW (ref 70–99)
Potassium: 3 mmol/L — ABNORMAL LOW (ref 3.5–5.1)
Potassium: 3.1 mmol/L — ABNORMAL LOW (ref 3.5–5.1)
Potassium: 3.3 mmol/L — ABNORMAL LOW (ref 3.5–5.1)
Sodium: 136 mmol/L (ref 135–145)
Sodium: 136 mmol/L (ref 135–145)
Sodium: 134 mmol/L — ABNORMAL LOW (ref 135–145)

## 2020-06-26 LAB — RESP PANEL BY RT-PCR (FLU A&B, COVID) ARPGX2
Influenza A by PCR: NEGATIVE
Influenza B by PCR: NEGATIVE
SARS Coronavirus 2 by RT PCR: NEGATIVE

## 2020-06-26 LAB — BLOOD GAS, ARTERIAL
Acid-Base Excess: 6.1 mmol/L — ABNORMAL HIGH (ref 0.0–2.0)
Bicarbonate: 29.6 mmol/L — ABNORMAL HIGH (ref 20.0–28.0)
O2 Saturation: 97.5 %
Patient temperature: 37
pCO2 arterial: 38 mmHg (ref 32.0–48.0)
pH, Arterial: 7.5 — ABNORMAL HIGH (ref 7.350–7.450)
pO2, Arterial: 88 mmHg (ref 83.0–108.0)

## 2020-06-26 LAB — CBC
HCT: 38.2 % (ref 36.0–46.0)
Hemoglobin: 12.5 g/dL (ref 12.0–15.0)
MCH: 26.4 pg (ref 26.0–34.0)
MCHC: 32.7 g/dL (ref 30.0–36.0)
MCV: 80.8 fL (ref 80.0–100.0)
Platelets: 378 10*3/uL (ref 150–400)
RBC: 4.73 MIL/uL (ref 3.87–5.11)
RDW: 13.5 % (ref 11.5–15.5)
WBC: 11.6 10*3/uL — ABNORMAL HIGH (ref 4.0–10.5)
nRBC: 0 % (ref 0.0–0.2)

## 2020-06-26 LAB — URINALYSIS, COMPLETE (UACMP) WITH MICROSCOPIC
Bacteria, UA: NONE SEEN
Bilirubin Urine: NEGATIVE
Glucose, UA: >=500 mg/dL — AB
Ketones, ur: 80 mg/dL — AB
Leukocytes,Ua: NEGATIVE
Nitrite: NEGATIVE
Protein, ur: 100 mg/dL — AB
Specific Gravity, Urine: 1.022 (ref 1.005–1.030)
pH: 5 (ref 5.0–8.0)

## 2020-06-26 LAB — COMPREHENSIVE METABOLIC PANEL
ALT: 11 U/L (ref 0–44)
AST: 13 U/L — ABNORMAL LOW (ref 15–41)
Albumin: 3.6 g/dL (ref 3.5–5.0)
Alkaline Phosphatase: 294 U/L — ABNORMAL HIGH (ref 38–126)
Anion gap: 23 — ABNORMAL HIGH (ref 5–15)
BUN: 16 mg/dL (ref 6–20)
CO2: 20 mmol/L — ABNORMAL LOW (ref 22–32)
Calcium: 9.4 mg/dL (ref 8.9–10.3)
Chloride: 88 mmol/L — ABNORMAL LOW (ref 98–111)
Creatinine, Ser: 0.78 mg/dL (ref 0.44–1.00)
GFR, Estimated: >60 mL/min (ref >60)
Glucose, Bld: 380 mg/dL — ABNORMAL HIGH (ref 70–99)
Potassium: 2.9 mmol/L — ABNORMAL LOW (ref 3.5–5.1)
Sodium: 131 mmol/L — ABNORMAL LOW (ref 135–145)
Total Bilirubin: 1.5 mg/dL — ABNORMAL HIGH (ref 0.3–1.2)
Total Protein: 9.1 g/dL — ABNORMAL HIGH (ref 6.5–8.1)

## 2020-06-26 LAB — URINE DRUG SCREEN, QUALITATIVE (ARMC ONLY)
Amphetamines, Ur Screen: NOT DETECTED
Barbiturates, Ur Screen: NOT DETECTED
Benzodiazepine, Ur Scrn: NOT DETECTED
Cannabinoid 50 Ng, Ur ~~LOC~~: POSITIVE — AB
Cocaine Metabolite,Ur ~~LOC~~: NOT DETECTED
MDMA (Ecstasy)Ur Screen: NOT DETECTED
Methadone Scn, Ur: NOT DETECTED
Opiate, Ur Screen: NOT DETECTED
Phencyclidine (PCP) Ur S: NOT DETECTED
Tricyclic, Ur Screen: NOT DETECTED

## 2020-06-26 LAB — PROCALCITONIN: Procalcitonin: 0.59 ng/mL

## 2020-06-26 LAB — CBG MONITORING, ED
Glucose-Capillary: 395 mg/dL — ABNORMAL HIGH (ref 70–99)
Glucose-Capillary: 271 mg/dL — ABNORMAL HIGH (ref 70–99)
Glucose-Capillary: 300 mg/dL — ABNORMAL HIGH (ref 70–99)
Glucose-Capillary: 172 mg/dL — ABNORMAL HIGH (ref 70–99)
Glucose-Capillary: 177 mg/dL — ABNORMAL HIGH (ref 70–99)
Glucose-Capillary: 181 mg/dL — ABNORMAL HIGH (ref 70–99)
Glucose-Capillary: 172 mg/dL — ABNORMAL HIGH (ref 70–99)
Glucose-Capillary: 105 mg/dL — ABNORMAL HIGH (ref 70–99)
Glucose-Capillary: 203 mg/dL — ABNORMAL HIGH (ref 70–99)
Glucose-Capillary: 345 mg/dL — ABNORMAL HIGH (ref 70–99)

## 2020-06-26 LAB — LIPASE, BLOOD: Lipase: 30 U/L (ref 11–51)

## 2020-06-26 LAB — BLOOD GAS, VENOUS
Acid-base deficit: 2.9 mmol/L — ABNORMAL HIGH (ref 0.0–2.0)
Bicarbonate: 22 mmol/L (ref 20.0–28.0)
O2 Saturation: 90.6 %
Patient temperature: 37
pCO2, Ven: 38 mmHg — ABNORMAL LOW (ref 44.0–60.0)
pH, Ven: 7.37 (ref 7.250–7.430)
pO2, Ven: 62 mmHg — ABNORMAL HIGH (ref 32.0–45.0)

## 2020-06-26 LAB — LACTIC ACID, PLASMA: Lactic Acid, Venous: 1.7 mmol/L (ref 0.5–1.9)

## 2020-06-26 LAB — BETA-HYDROXYBUTYRIC ACID
Beta-Hydroxybutyric Acid: >8 mmol/L — ABNORMAL HIGH (ref 0.05–0.27)
Beta-Hydroxybutyric Acid: 1.41 mmol/L — ABNORMAL HIGH (ref 0.05–0.27)

## 2020-06-26 LAB — ETHANOL: Alcohol, Ethyl (B): <10 mg/dL (ref <10)

## 2020-06-26 LAB — POC URINE PREG, ED: Preg Test, Ur: NEGATIVE

## 2020-06-26 LAB — C-REACTIVE PROTEIN: CRP: 2.2 mg/dL — ABNORMAL HIGH (ref <1.0)

## 2020-06-26 MED ORDER — DEXTROSE IN LACTATED RINGERS 5 % IV SOLN: INTRAVENOUS | Status: DC

## 2020-06-26 MED ORDER — LACTATED RINGERS IV BOLUS
1000.0000 mL | Freq: Once | INTRAVENOUS | Status: AC
  Administered 2020-06-26: 1000 mL via INTRAVENOUS

## 2020-06-26 MED ORDER — MAGNESIUM SULFATE 2 GM/50ML IV SOLN
2.0000 g | Freq: Once | INTRAVENOUS | Status: AC
  Administered 2020-06-26: 2 g via INTRAVENOUS
  Filled 2020-06-26: qty 50

## 2020-06-26 MED ORDER — POTASSIUM CHLORIDE 10 MEQ/100ML IV SOLN
10.0000 meq | INTRAVENOUS | Status: AC
  Administered 2020-06-26 (×3): 10 meq via INTRAVENOUS
  Filled 2020-06-26 (×4): qty 100

## 2020-06-26 MED ORDER — POTASSIUM CHLORIDE CRYS ER 20 MEQ PO TBCR
40.0000 meq | EXTENDED_RELEASE_TABLET | Freq: Once | ORAL | Status: AC
  Administered 2020-06-26: 40 meq via ORAL
  Filled 2020-06-26: qty 2

## 2020-06-26 MED ORDER — INSULIN REGULAR(HUMAN) IN NACL 100-0.9 UT/100ML-% IV SOLN
INTRAVENOUS | Status: DC
  Administered 2020-06-26: 10 [IU]/h via INTRAVENOUS
  Filled 2020-06-26: qty 100

## 2020-06-26 MED ORDER — ENOXAPARIN SODIUM 40 MG/0.4ML ~~LOC~~ SOLN
40.0000 mg | SUBCUTANEOUS | Status: DC
  Administered 2020-06-26 – 2020-06-29 (×4): 40 mg via SUBCUTANEOUS
  Filled 2020-06-26 (×4): qty 0.4

## 2020-06-26 MED ORDER — ONDANSETRON HCL 4 MG/2ML IJ SOLN
4.0000 mg | Freq: Four times a day (QID) | INTRAMUSCULAR | Status: DC | PRN
  Administered 2020-06-26 – 2020-06-30 (×6): 4 mg via INTRAVENOUS
  Filled 2020-06-26 (×6): qty 2

## 2020-06-26 MED ORDER — LACTATED RINGERS IV SOLN: INTRAVENOUS | Status: DC

## 2020-06-26 MED ORDER — INSULIN REGULAR(HUMAN) IN NACL 100-0.9 UT/100ML-% IV SOLN: INTRAVENOUS | Status: DC

## 2020-06-26 MED ORDER — LORAZEPAM 2 MG/ML IJ SOLN
0.5000 mg | Freq: Once | INTRAMUSCULAR | Status: AC
  Administered 2020-06-26: 0.5 mg via INTRAVENOUS
  Filled 2020-06-26: qty 1

## 2020-06-26 MED ORDER — INSULIN ASPART 100 UNIT/ML ~~LOC~~ SOLN
16.0000 [IU] | Freq: Once | SUBCUTANEOUS | Status: AC
  Administered 2020-06-26: 12 [IU] via SUBCUTANEOUS
  Filled 2020-06-26: qty 1

## 2020-06-26 MED ORDER — LORAZEPAM 2 MG/ML IJ SOLN
1.0000 mg | Freq: Once | INTRAMUSCULAR | Status: AC
  Administered 2020-06-26: 1 mg via INTRAVENOUS
  Filled 2020-06-26: qty 1

## 2020-06-26 MED ORDER — DROPERIDOL 2.5 MG/ML IJ SOLN
2.5000 mg | Freq: Once | INTRAMUSCULAR | Status: DC
  Filled 2020-06-26: qty 2

## 2020-06-26 MED ORDER — DEXTROSE 50 % IV SOLN: 0.0000 mL | INTRAVENOUS | Status: DC | PRN

## 2020-06-26 MED ORDER — ENOXAPARIN SODIUM 30 MG/0.3ML ~~LOC~~ SOLN
30.0000 mg | SUBCUTANEOUS | Status: DC
  Filled 2020-06-26: qty 0.3

## 2020-06-26 MED ORDER — KETOROLAC TROMETHAMINE 15 MG/ML IJ SOLN
15.0000 mg | Freq: Once | INTRAMUSCULAR | Status: AC
  Administered 2020-06-26: 15 mg via INTRAVENOUS
  Filled 2020-06-26: qty 1

## 2020-06-26 MED ORDER — INSULIN ASPART 100 UNIT/ML ~~LOC~~ SOLN
0.0000 [IU] | Freq: Every day | SUBCUTANEOUS | Status: DC
  Administered 2020-06-26: 4 [IU] via SUBCUTANEOUS
  Filled 2020-06-26: qty 1

## 2020-06-26 MED ORDER — IOHEXOL 300 MG/ML  SOLN
100.0000 mL | Freq: Once | INTRAMUSCULAR | Status: AC | PRN
  Administered 2020-06-26: 75 mL via INTRAVENOUS

## 2020-06-26 MED ORDER — POTASSIUM CHLORIDE 10 MEQ/100ML IV SOLN: 10.0000 meq | INTRAVENOUS | Status: DC

## 2020-06-26 MED ORDER — INSULIN ASPART 100 UNIT/ML ~~LOC~~ SOLN: 0.0000 [IU] | Freq: Three times a day (TID) | SUBCUTANEOUS | Status: DC

## 2020-06-26 MED ORDER — DEXTROSE 50 % IV SOLN
0.0000 mL | INTRAVENOUS | Status: DC | PRN
  Administered 2020-06-27: 50 mL via INTRAVENOUS
  Filled 2020-06-26: qty 50

## 2020-06-26 MED ORDER — INSULIN GLARGINE 100 UNIT/ML ~~LOC~~ SOLN
10.0000 [IU] | Freq: Every day | SUBCUTANEOUS | Status: DC
  Administered 2020-06-26 – 2020-06-29 (×4): 10 [IU] via SUBCUTANEOUS
  Filled 2020-06-26 (×6): qty 0.1

## 2020-06-26 MED ORDER — DROPERIDOL 2.5 MG/ML IJ SOLN
2.5000 mg | Freq: Once | INTRAMUSCULAR | Status: AC
  Administered 2020-06-26: 2.5 mg via INTRAVENOUS
  Filled 2020-06-26: qty 2

## 2020-06-26 MED ORDER — INSULIN ASPART 100 UNIT/ML ~~LOC~~ SOLN
3.0000 [IU] | Freq: Three times a day (TID) | SUBCUTANEOUS | Status: DC
  Administered 2020-06-27 – 2020-06-30 (×10): 3 [IU] via SUBCUTANEOUS
  Filled 2020-06-26 (×8): qty 1

## 2020-06-26 NOTE — ED Notes (Signed)
Blood sugar 172

## 2020-06-26 NOTE — ED Notes (Signed)
Pt in CT.

## 2020-06-26 NOTE — ED Notes (Signed)
Advised MD. Marcello Moores of

## 2020-06-26 NOTE — ED Notes (Signed)
Per dr Charna Archer, hold insulin drip until potassium addressed

## 2020-06-26 NOTE — ED Notes (Signed)
Patient transported to X-ray 

## 2020-06-26 NOTE — ED Notes (Signed)
Advised MD that 4 units where given at 9 she advised to give 12 instead of 16

## 2020-06-26 NOTE — ED Provider Notes (Signed)
[facility] Emergency Department Provider Note   ____________________________________________   Event Date/Time   First MD Initiated Contact with Patient 06/26/20 0715     (approximate)  I have reviewed the triage vital signs and the nursing notes.   HISTORY  Chief Complaint Seizures    HPI Rachael Gray is a 40 y.o. female with past medical history of diabetes, gastroparesis, anemia, and pseudoseizures who presents to the ED for shaking.  Patient reports that she "has been feeling bad" for the past 2 weeks.  She states that she is hurting all over but that the worst area of her pain is in her abdomen.  She points to her abdomen diffusely and describes constant sharp pain.  She states this is been associated with nausea and vomiting, but she denies any changes in her bowel movements.  She has not had any dysuria, vaginal bleeding, or vaginal discharge, states her last menstrual period was about 1 month ago.  She denies any fevers, cough, chest pain, or shortness of breath.  She states she has been compliant with her insulin regimen.  She has also concerned that she has had increased shaking recently affecting her whole body.  Daughter states she has previously been diagnosed with "tremor" but does not currently take any medication for this.  Patient reports feeling weak in both legs, denies any unilateral numbness or weakness.  She admits to smoking marijuana, but denies other drug use or alcohol consumption.        Past Medical History:  Diagnosis Date  . Collagen vascular disease ([place])   . Diabetes mellitus without complication ([place])   . Gastroparesis   . Lupus ([place])   . Pseudoseizures ([place])   . Seizures [facility])     Patient Active Problem List   Diagnosis Date Noted  . DKA (diabetic ketoacidosis) ([place]) 01/25/2020  . Tobacco abuse disorder 01/25/2020  . SIRS due to non-infectious process without acute organ dysfunction ([place]) 01/25/2020  . DKA,  type 2 ([place]) 01/25/2020  . Cannabis use disorder, moderate, dependence ([place]) 12/01/2016  . 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 Hypokalemia 12/24/2015  . Anemia 12/24/2015  . Diabetes ([place]) 12/24/2015  . Gastroparesis due to DM ([place])   . Acute respiratory alkalosis 12/22/2015  . Duodenitis 12/22/2015  . Vomiting 12/16/2015  . Nausea & vomiting 12/10/2014  . Narcotic abuse ([place]) 12/09/2014  . Sepsis ([place]) 12/07/2014  . Anxiety   . Pyelonephritis 12/04/2014  . Lactic acidosis 12/04/2014    Class: Acute    Past Surgical History:  Procedure Laterality Date  . APPENDECTOMY    . CHOLECYSTECTOMY      Prior to Admission medications   Medication Sig Start Date End Date Taking? Authorizing Provider  aspirin EC 81 MG EC tablet Take 1 tablet (81 mg total) by mouth daily. Swallow whole. Patient not taking: No sig reported 01/29/20   Nicole Kindred A, DO  atorvastatin (LIPITOR) 40 MG tablet Take 1 tablet (40 mg total) by mouth at bedtime. Patient not taking: No sig reported 01/28/20   Nicole Kindred A, DO  hydrocodone-ibuprofen (VICOPROFEN) 5-200 MG tablet Take 1 tablet by mouth every 8 (eight) hours as needed for pain. Patient not taking: No sig reported 01/23/20   Nena Polio, MD  insulin NPH-regular Human (NOVOLIN 70/30) (70-30) 100 UNIT/ML injection Inject 10 units under the skin twice daily with breakfast and dinner Patient not taking: No sig reported 01/28/20   Ezekiel Slocumb, DO  levETIRAcetam (KEPPRA)  750 MG tablet Take 1 tablet (750 mg total) by mouth 2 (two) times daily. Patient not taking: No sig reported 08/21/15   Paulette Blanch, MD  methocarbamol (ROBAXIN) 500 MG tablet Take 1 tablet (500 mg total) by mouth every 8 (eight) hours as needed for muscle spasms. Patient not taking: No sig reported 01/28/20   Nicole Kindred A, DO  metoCLOPramide (REGLAN) 10 MG tablet Take 1 tablet (10 mg total) by mouth every 8 (eight) hours as needed. Patient not taking: No sig  reported 02/25/17   Loney Hering, MD  metoprolol tartrate (LOPRESSOR) 25 MG tablet Take 0.5 tablets (12.5 mg total) by mouth 2 (two) times daily. Patient not taking: No sig reported 01/28/20   Nicole Kindred A, DO  ondansetron (ZOFRAN ODT) 4 MG disintegrating tablet Take 1 tablet (4 mg total) by mouth every 8 (eight) hours as needed for nausea or vomiting. Patient not taking: No sig reported 04/28/17   Alfred Levins, [place], MD  pantoprazole (PROTONIX) 40 MG tablet Take 1 tablet (40 mg total) by mouth 2 (two) times daily. Patient not taking: No sig reported 01/28/20   Nicole Kindred A, DO  potassium chloride 20 MEQ TBCR Take 40 mEq by mouth 2 (two) times daily for 10 days. 01/28/20 02/07/20  Ezekiel Slocumb, DO    Allergies Dilantin [phenytoin sodium extended], Doxycycline, Other, Morphine and related, and Tylenol [acetaminophen]  Family History  Problem Relation Age of Onset  . Breast cancer Maternal Grandmother     Social History Social History   Tobacco Use  . Smoking status: Current Every Day Smoker    Packs/day: 0.50    Years: 10.00    Pack years: 5.00    Types: Cigarettes  . Smokeless tobacco: Never Used  Vaping Use  . Vaping Use: Never used  Substance Use Topics  . Alcohol use: No  . Drug use: Yes    Types: Marijuana    Comment: yesterday    Review of Systems  Constitutional: No fever/chills.  Positive for generalized weakness. Eyes: No visual changes. ENT: No sore throat. Cardiovascular: Denies chest pain. Respiratory: Denies shortness of breath. Gastrointestinal: Positive for abdominal pain, nausea, and vomiting.  No diarrhea.  No constipation. Genitourinary: Negative for dysuria. Musculoskeletal: Negative for back pain. Skin: Negative for rash. Neurological: Negative for headaches, focal weakness or numbness.  Positive for tremor.  ____________________________________________   PHYSICAL EXAM:  VITAL SIGNS: ED Triage Vitals  Enc Vitals Group     BP  06/26/20 0713 (!) 134/101     Pulse Rate 06/26/20 0713 (!) 132     Resp 06/26/20 0713 20     Temp 06/26/20 0713 97.6 F (36.4 C)     Temp Source 06/26/20 0713 Oral     SpO2 06/26/20 0713 98 %     Weight 06/26/20 0708 125 lb (56.7 kg)     Height 06/26/20 0708 5\' 3"  (1.6 m)     Head Circumference --      Peak Flow --      Pain Score 06/26/20 0708 10     Pain Loc --      Pain Edu? --      Excl. in [place]? --     Constitutional: Alert and oriented. Eyes: Conjunctivae are normal.  Pupils equal round and reactive to light bilaterally. Head: Atraumatic. Nose: No congestion/rhinnorhea. Mouth/Throat: Mucous membranes are moist. Neck: Normal ROM Cardiovascular: Tachycardic, regular rhythm. Grossly normal heart sounds. Respiratory: Normal respiratory effort.  No retractions. Lungs  CTAB. Gastrointestinal: Soft and diffusely tender to palpation with no rebound or guarding. No distention. Genitourinary: deferred Musculoskeletal: No lower extremity tenderness nor edema. Neurologic:  Normal speech and language. No gross focal neurologic deficits are appreciated.  Diffuse body tremor noted that improves with purposeful activity. Skin:  Skin is warm, dry and intact. No rash noted. Psychiatric: Mood and affect are normal. Speech and behavior are normal.  ____________________________________________   LABS (all labs ordered are listed, but only abnormal results are displayed)  Labs Reviewed  CBC - Abnormal; Notable for the following components:      Result Value   WBC 11.6 (*)    All other components within normal limits  URINALYSIS, COMPLETE (UACMP) WITH MICROSCOPIC - Abnormal; Notable for the following components:   Color, Urine STRAW (*)    APPearance CLEAR (*)    Glucose, UA >=500 (*)    Hgb urine dipstick MODERATE (*)    Ketones, ur 80 (*)    Protein, ur 100 (*)    All other components within normal limits  COMPREHENSIVE METABOLIC PANEL - Abnormal; Notable for the following components:    Sodium 131 (*)    Potassium 2.9 (*)    Chloride 88 (*)    CO2 20 (*)    Glucose, Bld 380 (*)    Total Protein 9.1 (*)    AST 13 (*)    Alkaline Phosphatase 294 (*)    Total Bilirubin 1.5 (*)    Anion gap 23 (*)    All other components within normal limits  BLOOD GAS, VENOUS - Abnormal; Notable for the following components:   pCO2, Ven 38 (*)    pO2, Ven 62.0 (*)    Acid-base deficit 2.9 (*)    All other components within normal limits  CBG MONITORING, ED - Abnormal; Notable for the following components:   Glucose-Capillary 395 (*)    All other components within normal limits  RESP PANEL BY RT-PCR (FLU A&B, COVID) ARPGX2  LIPASE, BLOOD  BASIC METABOLIC PANEL  BASIC METABOLIC PANEL  BASIC METABOLIC PANEL  BETA-HYDROXYBUTYRIC ACID  BETA-HYDROXYBUTYRIC ACID  POC URINE PREG, ED  CBG MONITORING, ED  POC URINE PREG, ED   ____________________________________________  EKG  ED ECG REPORT I, Blake Divine, the attending physician, personally viewed and interpreted this ECG.   Date: 06/26/2020  EKG Time: 7:31  Rate: 141  Rhythm: sinus tachycardia  Axis: Normal  Intervals:none  ST&T Change: None   PROCEDURES  Procedure(s) performed (including Critical Care):  .Critical Care Performed by: Blake Divine, MD Authorized by: Blake Divine, MD   Critical care provider statement:    Critical care time (minutes):  45   Critical care time was exclusive of:  Separately billable procedures and treating other patients and teaching time   Critical care was necessary to treat or prevent imminent or life-threatening deterioration of the following conditions:  Metabolic crisis   Critical care was time spent personally by me on the following activities:  Discussions with consultants, evaluation of patient's response to treatment, examination of patient, ordering and performing treatments and interventions, ordering and review of laboratory studies, ordering and review of  radiographic studies, pulse oximetry, re-evaluation of patient's condition, obtaining history from patient or surrogate and review of old charts   I assumed direction of critical care for this patient from another provider in my specialty: no     Care discussed with: admitting provider       ____________________________________________   INITIAL IMPRESSION / ASSESSMENT  AND PLAN / ED COURSE       40 year old female with past medical history of diabetes, gastroparesis, anemia, and pseudoseizures who presents to the ED for diffuse shaking with generalized malaise, abdominal pain, nausea, and vomiting worsening over the past 2 weeks.  She is diffusely tremulous on my assessment with no focal neurologic deficits, remains awake and alert and answering questions appropriately.  This does not appear consistent with epileptic seizure as with generalized seizure activity I would expect that patient would lose consciousness, also not consistent with focal seizure.  This appears more consistent with episodes in the past, when she has been diagnosed with nonepileptic seizures and severe anxiety.  We will hydrate with IV fluids and give dose of IV Ativan, EKG shows sinus tachycardia with no ischemic changes.  We will further assess her abdominal pain with labs and CT scan, pregnancy testing and UA are also pending.  I do not feel advanced imaging of her head is indicated at this time.  I would also consider DKA, electrolyte abnormality, and gastroparesis given her history of poorly controlled diabetes.  Lab work is concerning for mild DKA with hyperglycemia, increased anion gap, and mild acidosis.  Patient also noted to be hypokalemic, was given oral dose of potassium along with IV potassium supplementation prior to initiation of insulin drip.  CT abdomen/pelvis appears consistent with esophagitis but otherwise reassuring.  Patient reports feeling slightly better following dose of droperidol.  Plan to discuss with  hospitalist for admission.      ____________________________________________   FINAL CLINICAL IMPRESSION(S) / ED DIAGNOSES  Final diagnoses:  Diabetic ketoacidosis without coma associated with type 2 diabetes mellitus ([place])  Seizure-like activity [facility])     ED Discharge Orders    None       Note:  This document was prepared using Dragon voice recognition software and may include unintentional dictation errors.   Blake Divine, MD 06/26/20 1229

## 2020-06-26 NOTE — H&P (Signed)
History and Physical    Rachael Gray IHK:[id number] DOB: March 18, 1981 DOA: 06/26/2020  PCP: [facility] [place]  Patient coming from: home  I have personally briefly reviewed patient's old medical records in [place]  Chief Complaint:   HPI: Rachael Gray is a 40 y.o. female  with past medical history of diabetes,gastroparesis,anemia,Lupus,pseudoseizures,anxiety,.marijuana use, tobacco use,  recurrent DKA who presents to ed with complaint of shaking and vague complaint of note feeling well. On evaluation found to have mild DKA s/p which patient was started on dka protocol/insulin drip. Patient states she has been ill for around two weeks with n/v/and abdominal pain with fever and chills. However she denies cough, diarrhea, dysuria , noncompliance with her insulin , she noted significant fatigue and malaise.   ED Course:  Vs:97.6, BP 134/101, hr 132, rr 20 ,sat 100% ra Labs: UA: + ketones, Wbc 11.6, hgb 12.5, plt378 Na 131, K 2.9, cL 88, co2 20, glu 380, cr 0.78 , alkphos 294, tibili 1.5 AG 23 lipse 30, beta hydroxy>8 Vbg:7.37, pco238 Respiratory panel pending CT abd/pelvis: Wall thickening of distal esophagus is noted concerning for possible esophagitis. Endoscopy is recommended for further evaluation.  No other abnormality seen in the abdomen or pelvis. tx LR1L , droperidol x1, lorazepam 1mg  x 1, potassium 40 meq x1,magnesium x1  Review of Systems: As per HPI otherwise 10 point review of systems negative.   Past Medical History:  Diagnosis Date  . Collagen vascular disease ([place])   . Diabetes mellitus without complication ([place])   . Gastroparesis   . Lupus ([place])   . Pseudoseizures ([place])   . Seizures ([place])     Past Surgical History:  Procedure Laterality Date  . APPENDECTOMY    . CHOLECYSTECTOMY       reports that she has been smoking cigarettes. She has a 5.00 pack-year smoking history. She has never used smokeless tobacco. She  reports current drug use. Drug: Marijuana. She reports that she does not drink alcohol.  Allergies  Allergen Reactions  . Dilantin [Phenytoin Sodium Extended] Hives  . Doxycycline Swelling  . Other   . Morphine And Related Rash  . Tylenol [Acetaminophen] Rash    Family History  Problem Relation Age of Onset  . Breast cancer Maternal Grandmother     Prior to Admission medications   Medication Sig Start Date End Date Taking? Authorizing Provider  aspirin EC 81 MG EC tablet Take 1 tablet (81 mg total) by mouth daily. Swallow whole. Patient not taking: No sig reported 01/29/20   Nicole Kindred A, DO  atorvastatin (LIPITOR) 40 MG tablet Take 1 tablet (40 mg total) by mouth at bedtime. Patient not taking: No sig reported 01/28/20   Nicole Kindred A, DO  hydrocodone-ibuprofen (VICOPROFEN) 5-200 MG tablet Take 1 tablet by mouth every 8 (eight) hours as needed for pain. Patient not taking: No sig reported 01/23/20   Nena Polio, MD  insulin NPH-regular Human (NOVOLIN 70/30) (70-30) 100 UNIT/ML injection Inject 10 units under the skin twice daily with breakfast and dinner Patient not taking: No sig reported 01/28/20   Ezekiel Slocumb, DO  levETIRAcetam (KEPPRA) 750 MG tablet Take 1 tablet (750 mg total) by mouth 2 (two) times daily. Patient not taking: No sig reported 08/21/15   Paulette Blanch, MD  methocarbamol (ROBAXIN) 500 MG tablet Take 1 tablet (500 mg total) by mouth every 8 (eight) hours as needed for muscle spasms. Patient not taking: No sig reported 01/28/20  Nicole Kindred A, DO  metoCLOPramide (REGLAN) 10 MG tablet Take 1 tablet (10 mg total) by mouth every 8 (eight) hours as needed. Patient not taking: No sig reported 02/25/17   Loney Hering, MD  metoprolol tartrate (LOPRESSOR) 25 MG tablet Take 0.5 tablets (12.5 mg total) by mouth 2 (two) times daily. Patient not taking: No sig reported 01/28/20   Nicole Kindred A, DO  ondansetron (ZOFRAN ODT) 4 MG disintegrating tablet  Take 1 tablet (4 mg total) by mouth every 8 (eight) hours as needed for nausea or vomiting. Patient not taking: No sig reported 04/28/17   Alfred Levins, [place], MD  pantoprazole (PROTONIX) 40 MG tablet Take 1 tablet (40 mg total) by mouth 2 (two) times daily. Patient not taking: No sig reported 01/28/20   Nicole Kindred A, DO  potassium chloride 20 MEQ TBCR Take 40 mEq by mouth 2 (two) times daily for 10 days. 01/28/20 02/07/20  Ezekiel Slocumb, DO    Physical Exam: Vitals:   06/26/20 0900 06/26/20 0930 06/26/20 1000 06/26/20 1100  BP: 125/87 126/76 117/62 135/81  Pulse: (!) 125 (!) 121 (!) 120 (!) 124  Resp: 18  20   Temp:      TempSrc:      SpO2: 100% 100% 100% 100%  Weight:      Height:         Vitals:   06/26/20 0900 06/26/20 0930 06/26/20 1000 06/26/20 1100  BP: 125/87 126/76 117/62 135/81  Pulse: (!) 125 (!) 121 (!) 120 (!) 124  Resp: 18  20   Temp:      TempSrc:      SpO2: 100% 100% 100% 100%  Weight:      Height:      Constitutional: NAD, calm, somnolent Eyes: PERRL, lids and conjunctivae normal ENMT: Mucous membranes are dry, Posterior pharynx clear of any exudate or lesions.Normal dentition.  Neck: normal, supple, no masses, no thyromegaly Respiratory: clear to auscultation bilaterally, no wheezing, no crackles. Normal respiratory effort. No accessory muscle use.  Cardiovascular: Regular rate and rhythm, no murmurs / rubs / gallops. No extremity edema. 2+ pedal pulses. No carotid bruits.  Abdomen: mild diffuse tenderness, no masses palpated. No hepatosplenomegaly. Bowel sounds positive.  Musculoskeletal: no clubbing / cyanosis. No joint deformity upper and lower extremities. Good ROM, no contractures. Normal muscle tone.  Skin: no rashes, lesions, ulcers. No induration Neurologic: CN 2-12 grossly intact. Sensation intact. Strength 5/5 in all 4.  Psychiatric: Normal judgment and insight. Alert and oriented x 3. Normal mood.    Labs on Admission: I have personally  reviewed following labs and imaging studies  CBC: Recent Labs  Lab 06/26/20 0729  WBC 11.6*  HGB 12.5  HCT 38.2  MCV 80.8  PLT 010   Basic Metabolic Panel: Recent Labs  Lab 06/26/20 0908  NA 131*  K 2.9*  CL 88*  CO2 20*  GLUCOSE 380*  BUN 16  CREATININE 0.78  CALCIUM 9.4   GFR: Estimated Creatinine Clearance: 78.9 mL/min (by C-G formula based on SCr of 0.78 mg/dL). Liver Function Tests: Recent Labs  Lab 06/26/20 0908  AST 13*  ALT 11  ALKPHOS 294*  BILITOT 1.5*  PROT 9.1*  ALBUMIN 3.6   Recent Labs  Lab 06/26/20 0908  LIPASE 30   No results for input(s): AMMONIA in the last 168 hours. Coagulation Profile: No results for input(s): INR, PROTIME in the last 168 hours. Cardiac Enzymes: No results for input(s): CKTOTAL, CKMB, CKMBINDEX, TROPONINI in  the last 168 hours. BNP (last 3 results) No results for input(s): PROBNP in the last 8760 hours. HbA1C: No results for input(s): HGBA1C in the last 72 hours. CBG: Recent Labs  Lab 06/26/20 1132  GLUCAP 395*   Lipid Profile: No results for input(s): CHOL, HDL, LDLCALC, TRIG, CHOLHDL, LDLDIRECT in the last 72 hours. Thyroid Function Tests: No results for input(s): TSH, T4TOTAL, FREET4, T3FREE, THYROIDAB in the last 72 hours. Anemia Panel: No results for input(s): VITAMINB12, FOLATE, FERRITIN, TIBC, IRON, RETICCTPCT in the last 72 hours. Urine analysis:    Component Value Date/Time   COLORURINE STRAW (A) 06/26/2020 0727   APPEARANCEUR CLEAR (A) 06/26/2020 0727   APPEARANCEUR HAZY 06/15/2014 2100   LABSPEC 1.022 06/26/2020 0727   LABSPEC 1.018 06/15/2014 2100   PHURINE 5.0 06/26/2020 0727   GLUCOSEU >=500 (A) 06/26/2020 0727   GLUCOSEU >=500 mg/dL 06/15/2014 2100   HGBUR MODERATE (A) 06/26/2020 0727   BILIRUBINUR NEGATIVE 06/26/2020 0727   BILIRUBINUR NEGATIVE 06/15/2014 2100   KETONESUR 80 (A) 06/26/2020 0727   PROTEINUR 100 (A) 06/26/2020 0727   UROBILINOGEN 1.0 02/09/2008 1519   NITRITE NEGATIVE  06/26/2020 0727   LEUKOCYTESUR NEGATIVE 06/26/2020 0727   LEUKOCYTESUR TRACE 06/15/2014 2100    Radiological Exams on Admission: CT Abdomen Pelvis W Contrast  Result Date: 06/26/2020 CLINICAL DATA:  Acute generalized abdominal pain. EXAM: CT ABDOMEN AND PELVIS WITH CONTRAST TECHNIQUE: Multidetector CT imaging of the abdomen and pelvis was performed using the standard protocol following bolus administration of intravenous contrast. CONTRAST:  65mL OMNIPAQUE IOHEXOL 300 MG/ML  SOLN COMPARISON:  January 23, 2020. FINDINGS: Lower chest: Visualized lung bases are unremarkable. Wall thickening of distal esophagus is noted concerning for possible esophagitis. Hepatobiliary: No focal liver abnormality is seen. Status post cholecystectomy. No biliary dilatation. Pancreas: Unremarkable. No pancreatic ductal dilatation or surrounding inflammatory changes. Spleen: Normal in size without focal abnormality. Adrenals/Urinary Tract: Adrenal glands are unremarkable. Kidneys are normal, without renal calculi, focal lesion, or hydronephrosis. Bladder is unremarkable. Stomach/Bowel: The stomach appears normal. There is no evidence of bowel obstruction or inflammation. Status post appendectomy. Vascular/Lymphatic: No significant vascular findings are present. No enlarged abdominal or pelvic lymph nodes. Reproductive: Uterus and bilateral adnexa are unremarkable. Other: No abdominal wall hernia or abnormality. No abdominopelvic ascites. Musculoskeletal: No acute or significant osseous findings. IMPRESSION: Wall thickening of distal esophagus is noted concerning for possible esophagitis. Endoscopy is recommended for further evaluation. No other abnormality seen in the abdomen or pelvis. Electronically Signed   By: Marijo Conception M.D.   On: 06/26/2020 12:23    EKG: Independently reviewed. Pending.  Assessment/Plan DKA -admit to step down  -continue with DKA protocol, ivfs/insulin/q4-6h BMP,transition as able -of note no  infectious focus noted at this time  - patient noted compliance with medication  -possible related to viral syndrome nos   Viral Syndrome nos -patient with symptoms of malaise fatigue abd pain n/v symptoms spanning 2 weeks  -respiratory panel negative -cxr neg -ct abd/pelvis notes solely esophagitis  -supportive care with antiemetics    Esophagitis  -start ppi  -gi consult re need for Egd   Tremors/psuedosz -per family not currently on medications for diagnosis of tremors   DVT prophylaxis:  Lovenox  Code Status: Full Family Communication:  N/A Disposition Plan: patient  expected to be admitted greater than 2 midnights Consults called: n/a Admission status: inpatient   Clance Boll MD [facility]  If 7PM-7AM, please contact night-coverage [email or web address] Password [facility]  06/26/2020, 12:38  PM    

## 2020-06-26 NOTE — ED Notes (Signed)
Difficulty obtaining iv access

## 2020-06-26 NOTE — ED Triage Notes (Signed)
Pt comes with c/o shaking and possible seizures. Family states this has been going on for several weeks. Pt not answering any questions with this RN at this time. Pt shaking with arms crossed across her chest.  Family reports pt does not take any meds for seizures. Family reports pt has not been eating and has lost weight.   Pt states marijuana use every so often.

## 2020-06-26 NOTE — ED Notes (Signed)
Blood sugar 271 , Sent MD message regarding blood sugar

## 2020-06-26 NOTE — ED Notes (Signed)
See triage note. Pt with daughter reports tremors for weeks. Pt speaking in complete sentences, alert and oriented

## 2020-06-26 NOTE — ED Notes (Signed)
Pt ambulated to the toilet

## 2020-06-26 NOTE — ED Notes (Signed)
IV team at bedside 

## 2020-06-26 NOTE — ED Notes (Signed)
Pt requested pain med; notified doctor of current state of tremors with little pain

## 2020-06-26 NOTE — ED Notes (Signed)
Sent MD. Marcello Moores blood sugar reading and anion gap reading

## 2020-06-26 NOTE — Progress Notes (Signed)
Anticoagulation monitoring(Lovenox):  39yo  F ordered Lovenox 30 mg Q24h  Filed Weights   06/26/20 0708  Weight: 56.7 kg (125 lb)   BMI 22   Lab Results  Component Value Date   CREATININE 0.78 06/26/2020   CREATININE 0.45 05/07/2020   CREATININE 0.42 (L) 05/07/2020   Estimated Creatinine Clearance: 78.9 mL/min (by C-G formula based on SCr of 0.78 mg/dL). Hemoglobin & Hematocrit     Component Value Date/Time   HGB 12.5 06/26/2020 0729   HGB 12.2 06/18/2014 0457   HCT 38.2 06/26/2020 0729   HCT 37.3 06/18/2014 0457     Per Protocol for Patient with estCrcl > 30 ml/min and BMI < 30, will transition to Lovenox 40 mg Q24h     Chinita Greenland PharmD Clinical Pharmacist 06/26/2020

## 2020-06-26 NOTE — ED Notes (Signed)
MD Marcello Moores of pt blood glucose reading of 105. ENDO tool advised to stop insulin. Also advised MD that pt has been complaining of mild to moderate pain. Was advised to stop D5LR and just run LR at the moment till we get labs back

## 2020-06-26 NOTE — ED Notes (Signed)
Seizure pads in place

## 2020-06-26 NOTE — ED Notes (Signed)
Pt accidentally pulled out her IV

## 2020-06-26 NOTE — Progress Notes (Signed)
Inpatient Diabetes Program Recommendations  AACE/ADA: New Consensus Statement on Inpatient Glycemic Control   Target Ranges:  Prepandial:   less than 140 mg/dL      Peak postprandial:   less than 180 mg/dL (1-2 hours)      Critically ill patients:  140 - 180 mg/dL   Results for Rachael Gray, Rachael Gray (MRN [id number]) as of 06/26/2020 15:34  Ref. Range 06/26/2020 11:32 06/26/2020 12:36 06/26/2020 13:40  Glucose-Capillary Latest Ref Range: 70 - 99 mg/dL 395 (H) 271 (H) 300 (H)  Results for Rachael Gray, Rachael Gray (MRN [id number]) as of 06/26/2020 15:34  Ref. Range 06/26/2020 09:08  CO2 Latest Ref Range: 22 - 32 mmol/L 20 (L)  Glucose Latest Ref Range: 70 - 99 mg/dL 380 (H)  Anion gap Latest Ref Range: 5 - 15  23 (H)  Results for Rachael Gray, Rachael Gray (MRN [id number]) as of 06/26/2020 15:34  Ref. Range 06/26/2020 11:47  Beta-Hydroxybutyric Acid Latest Ref Range: 0.05 - 0.27 mmol/L >8.00 (H)  Results for Rachael Gray, Rachael Gray (MRN [id number]) as of 06/26/2020 15:34  Ref. Range 01/25/2020 13:32 05/06/2020 03:35  Hemoglobin A1C Latest Ref Range: 4.8 - 5.6 % 11.8 (H) 13.9 (H)   Review of Glycemic Control  Diabetes history: DM2 Outpatient Diabetes medications: 70/30 10 units BID Current orders for Inpatient glycemic control: IV insulin for DKA  Inpatient Diabetes Program Recommendations:     Insulin: IV insulin should be continued until acidosis is completely resolved (as determined by venous CO2 >19 mmol/L, normal anion gap (8-12), negative ketones (beta-hydroxybutyric acid <0.5 mmol/L)). Once acidosis is cleared and provider ready to transition from IV to SQ insulin, please consider ordering Lantus 10 units Q24H, CBGs Q4H, and Novolog 0-6 units Q4H.  NOTE: Noted consult for diabetes coordinator. Chart reviewed. Patient known to inpatient diabetes team due to multiple admissions with DKA. Patient was seen by inpatient diabetes coordinator on 01/26/20 during prior admission and last hospital admission was 05/05/20-05/07/20 and  A1C was 13.9% during that time. Per chart, patient has Medicaid and goes to [facility] for follow up care. Patient is currently in the Emergency Department and presented with initial glucose of 380 mg/dl at 9:08 am today. Patient is ordered IV insulin for DKA. Will plan to follow up with patient tomorrow.  Thanks, Barnie Alderman, RN, MSN, CDE Diabetes Coordinator Inpatient Diabetes Program [phone] (Team Pager from 8am to 5pm)

## 2020-06-27 ENCOUNTER — Encounter: Payer: Self-pay | Admitting: Internal Medicine

## 2020-06-27 DIAGNOSIS — R933 Abnormal findings on diagnostic imaging of other parts of digestive tract: Secondary | ICD-10-CM

## 2020-06-27 DIAGNOSIS — E1010 Type 1 diabetes mellitus with ketoacidosis without coma: Secondary | ICD-10-CM

## 2020-06-27 DIAGNOSIS — R112 Nausea with vomiting, unspecified: Secondary | ICD-10-CM

## 2020-06-27 DIAGNOSIS — R748 Abnormal levels of other serum enzymes: Secondary | ICD-10-CM

## 2020-06-27 LAB — HEPATIC FUNCTION PANEL
ALT: 9 U/L (ref 0–44)
AST: 20 U/L (ref 15–41)
Albumin: 3.3 g/dL — ABNORMAL LOW (ref 3.5–5.0)
Alkaline Phosphatase: 229 U/L — ABNORMAL HIGH (ref 38–126)
Bilirubin, Direct: 0.1 mg/dL (ref 0.0–0.2)
Indirect Bilirubin: 0.6 mg/dL (ref 0.3–0.9)
Total Bilirubin: 0.7 mg/dL (ref 0.3–1.2)
Total Protein: 8.2 g/dL — ABNORMAL HIGH (ref 6.5–8.1)

## 2020-06-27 LAB — CBG MONITORING, ED
Glucose-Capillary: 116 mg/dL — ABNORMAL HIGH (ref 70–99)
Glucose-Capillary: 121 mg/dL — ABNORMAL HIGH (ref 70–99)
Glucose-Capillary: 156 mg/dL — ABNORMAL HIGH (ref 70–99)
Glucose-Capillary: 173 mg/dL — ABNORMAL HIGH (ref 70–99)
Glucose-Capillary: 183 mg/dL — ABNORMAL HIGH (ref 70–99)
Glucose-Capillary: 37 mg/dL — CL (ref 70–99)
Glucose-Capillary: 75 mg/dL (ref 70–99)
Glucose-Capillary: 99 mg/dL (ref 70–99)

## 2020-06-27 LAB — PROCALCITONIN: Procalcitonin: 0.71 ng/mL

## 2020-06-27 LAB — BASIC METABOLIC PANEL
Anion gap: 9 (ref 5–15)
BUN: 20 mg/dL (ref 6–20)
CO2: 27 mmol/L (ref 22–32)
Calcium: 9.2 mg/dL (ref 8.9–10.3)
Chloride: 97 mmol/L — ABNORMAL LOW (ref 98–111)
Creatinine, Ser: 0.63 mg/dL (ref 0.44–1.00)
GFR, Estimated: >60 mL/min (ref >60)
Glucose, Bld: 91 mg/dL (ref 70–99)
Potassium: 2.9 mmol/L — ABNORMAL LOW (ref 3.5–5.1)
Sodium: 133 mmol/L — ABNORMAL LOW (ref 135–145)

## 2020-06-27 LAB — GLUCOSE, CAPILLARY
Glucose-Capillary: 133 mg/dL — ABNORMAL HIGH (ref 70–99)
Glucose-Capillary: 152 mg/dL — ABNORMAL HIGH (ref 70–99)

## 2020-06-27 LAB — GAMMA GT: GGT: 191 U/L — ABNORMAL HIGH (ref 7–50)

## 2020-06-27 LAB — PHOSPHORUS: Phosphorus: 2.8 mg/dL (ref 2.5–4.6)

## 2020-06-27 LAB — MAGNESIUM: Magnesium: 2.1 mg/dL (ref 1.7–2.4)

## 2020-06-27 LAB — BETA-HYDROXYBUTYRIC ACID: Beta-Hydroxybutyric Acid: 0.23 mmol/L (ref 0.05–0.27)

## 2020-06-27 MED ORDER — DIPHENHYDRAMINE HCL 25 MG PO CAPS
25.0000 mg | ORAL_CAPSULE | Freq: Four times a day (QID) | ORAL | Status: DC | PRN
  Administered 2020-06-27 – 2020-06-29 (×3): 25 mg via ORAL
  Filled 2020-06-27 (×3): qty 1

## 2020-06-27 MED ORDER — LACTATED RINGERS IV SOLN: INTRAVENOUS | Status: DC

## 2020-06-27 MED ORDER — METOPROLOL TARTRATE 25 MG PO TABS
12.5000 mg | ORAL_TABLET | Freq: Two times a day (BID) | ORAL | Status: DC
  Administered 2020-06-27 – 2020-06-30 (×5): 12.5 mg via ORAL
  Filled 2020-06-27 (×6): qty 1

## 2020-06-27 MED ORDER — POTASSIUM CHLORIDE IN NACL 40-0.9 MEQ/L-% IV SOLN
INTRAVENOUS | Status: DC
  Filled 2020-06-27 (×5): qty 1000

## 2020-06-27 MED ORDER — INSULIN ASPART 100 UNIT/ML ~~LOC~~ SOLN
0.0000 [IU] | Freq: Every day | SUBCUTANEOUS | Status: DC
  Administered 2020-06-29: 2 [IU] via SUBCUTANEOUS
  Filled 2020-06-27: qty 1

## 2020-06-27 MED ORDER — HYDROXYZINE HCL 25 MG PO TABS
50.0000 mg | ORAL_TABLET | Freq: Four times a day (QID) | ORAL | Status: DC | PRN
  Administered 2020-06-27 – 2020-06-29 (×3): 50 mg via ORAL
  Filled 2020-06-27 (×3): qty 2

## 2020-06-27 MED ORDER — MORPHINE SULFATE (PF) 2 MG/ML IV SOLN
2.0000 mg | INTRAVENOUS | Status: DC | PRN
  Administered 2020-06-27 – 2020-06-30 (×14): 2 mg via INTRAVENOUS
  Filled 2020-06-27 (×14): qty 1

## 2020-06-27 MED ORDER — OXYCODONE HCL 5 MG PO TABS: 5.0000 mg | ORAL_TABLET | Freq: Four times a day (QID) | ORAL | Status: DC | PRN

## 2020-06-27 MED ORDER — POTASSIUM CHLORIDE CRYS ER 20 MEQ PO TBCR
40.0000 meq | EXTENDED_RELEASE_TABLET | Freq: Once | ORAL | Status: AC
  Administered 2020-06-27: 10 meq via ORAL
  Filled 2020-06-27: qty 2

## 2020-06-27 MED ORDER — OXYCODONE HCL 5 MG PO TABS
5.0000 mg | ORAL_TABLET | Freq: Four times a day (QID) | ORAL | Status: AC | PRN
  Administered 2020-06-27: 5 mg via ORAL
  Filled 2020-06-27 (×2): qty 1

## 2020-06-27 MED ORDER — ATORVASTATIN CALCIUM 20 MG PO TABS
40.0000 mg | ORAL_TABLET | Freq: Every day | ORAL | Status: DC
  Administered 2020-06-28 – 2020-06-29 (×2): 40 mg via ORAL
  Filled 2020-06-27 (×3): qty 2

## 2020-06-27 MED ORDER — KETOROLAC TROMETHAMINE 30 MG/ML IJ SOLN
30.0000 mg | Freq: Three times a day (TID) | INTRAMUSCULAR | Status: DC | PRN
  Administered 2020-06-28: 30 mg via INTRAVENOUS
  Filled 2020-06-27: qty 1

## 2020-06-27 MED ORDER — INSULIN ASPART 100 UNIT/ML ~~LOC~~ SOLN
0.0000 [IU] | Freq: Three times a day (TID) | SUBCUTANEOUS | Status: DC
  Administered 2020-06-27: 1 [IU] via SUBCUTANEOUS
  Administered 2020-06-28: 3 [IU] via SUBCUTANEOUS
  Administered 2020-06-28 – 2020-06-30 (×5): 1 [IU] via SUBCUTANEOUS
  Filled 2020-06-27 (×3): qty 1

## 2020-06-27 NOTE — ED Notes (Addendum)
LR d5 restarted at this time. 125/hr

## 2020-06-27 NOTE — Progress Notes (Signed)
Patient states oxycodone is not effectively helping with her pain.  Patient states that dilaudid has been the only thing effective in pain control previously.  MD contacted, awaiting return response and/or orders.  Patient noted to rock/shake more when patient is aware that nurse is in room.  Will report off to oncoming nurse.

## 2020-06-27 NOTE — ED Notes (Signed)
MD notified at this time that pt K+ was 2.9 this morning, and notified of pt request to receive medication for anxiety as well.

## 2020-06-27 NOTE — ED Notes (Signed)
Dr. Damita Dunnings contacted via secure chat to clarify pt medication orders at this time to ensure pt is receiving appropriate medications

## 2020-06-27 NOTE — Progress Notes (Addendum)
Inpatient Diabetes Program Recommendations  AACE/ADA: New Consensus Statement on Inpatient Glycemic Control   Target Ranges:  Prepandial:   less than 140 mg/dL      Peak postprandial:   less than 180 mg/dL (1-2 hours)      Critically ill patients:  140 - 180 mg/dL  Results for Rachael Gray, Rachael Gray (MRN [id number]) as of 06/27/2020 07:23  Ref. Range 06/27/2020 00:11 06/27/2020 00:59 06/27/2020 02:00 06/27/2020 03:14 06/27/2020 04:03 06/27/2020 06:05  Glucose-Capillary Latest Ref Range: 70 - 99 mg/dL 37 (LL) 116 (H) 75 99 121 (H) 156 (H)   Results for Rachael Gray, Rachael Gray (MRN [id number]) as of 06/27/2020 07:23  Ref. Range 06/26/2020 11:32 06/26/2020 12:36 06/26/2020 13:40 06/26/2020 14:48 06/26/2020 14:50 06/26/2020 15:45 06/26/2020 16:51 06/26/2020 18:12 06/26/2020 19:24 06/26/2020 20:56 06/26/2020 22:02  Glucose-Capillary Latest Ref Range: 70 - 99 mg/dL 395 (H) 271 (H) 300 (H) 172 (H) 177 (H) 181 (H) 172 (H) 105 (H)  IV insulin stopped@ 18:17 203 (H) 345 (H)  Novolog 4 units@21 :00  Lantus 10 units @21 :55   Novolog 12 units@22 :02  Results for Rachael Gray, Rachael Gray (MRN [id number]) as of 06/27/2020 11:55  Ref. Range 01/25/2020 13:32 05/06/2020 03:35  Hemoglobin A1C Latest Ref Range: 4.8 - 5.6 % 11.8 (H) 13.9 (H)   Review of Glycemic Control  Diabetes history: DM2 Outpatient Diabetes medications: 70/30 10 units BID Current orders for Inpatient glycemic control: Lantus 10 units daily, Novolog 0-15 units TID with meals, Novolog 0-5 units QHS, Novolog 3 units TID with meals  Inpatient Diabetes Program Recommendations:     Insulin: Please decrease Novolog correction to Novolog 0-6 units TID with meals.  NOTE: Noted consult for diabetes coordinator. Chart reviewed. Patient known to inpatient diabetes team due to multiple admissions with DKA. Patient was seen by inpatient diabetes coordinator on 01/26/20 during prior admission and last hospital admission was 05/05/20-05/07/20 and A1C was 13.9% during that time. Per chart, patient has  Medicaid and goes to [facility] for follow up care.  Patient presented in DKA with initial glucose of 380 mg/dl and was started on IV insulin on 06/25/20 which has been stopped. Per chart EndoTool was stopped at 18:17 and CBG was 105 mg/dl at that time. No basal insulin was given prior to stopping IV insulin and glucose up to 203 mg/dl at 19:24 and 345 mg/dl at 20:56.  Patient was given a total of Novolog 16 units (between 21:00-22:02 on 06/26/20) which caused glucose to drop down to 37 mg/dl at 00:11 on 06/27/20. Recommend decreasing Novolog correction to very sensitive scale.  Addendum 06/27/20@11 :45-Spoke with patient at bedside. Patient lying in bed watching TV and upon entering room patient began to rock herself back and forth. Patient would stop at times when she answered question but continued rocking motion throughout conversation.  Patient reports that she does not currently have a PCP. She states her prior PCP has to sign off so she can get a new PCP. Patient reports she is taking 70/30 10 units BID (vial/syringe) as an outpatient for diabetes control. Patient reports taking DM medications as prescribed.  Patient reports checking glucose 1 time per day (in the morning) and that it is usually in the 100-200's mg/dl and she rarely has hypoglycemia.   Inquired about prior A1C and patient stated "What is that?". Explained what an A1C is and how it helps determine glucose control over the past 2-3 months. Explained that her last A1C was 13.9% on 05/06/20 indicating her average glucose was 352  mg/dl. Patient states that her glucose does not run that high. Explained that perhaps her glucose is going in during the day after eating and it would be helpful for her to check her glucose 3-4 times a day but definitely 2 times a day.  Discussed glucose and A1C goals. Discussed importance of checking CBGs and maintaining good CBG control to prevent long-term and short-term complications. Explained how hyperglycemia  leads to damage within blood vessels which lead to the common complications seen with uncontrolled diabetes. Stressed to the patient the importance of improving glycemic control to prevent further complications from uncontrolled diabetes. Asked patient to work on getting new PCP as she will need a provider to continue to prescribe DM medications and supplies.  Encouraged patient to check glucose 3-4 times per day and take glucometer to doctor appointments. Patient states that she has everything she needs for DM managment at home.  Patient verbalized understanding of information discussed and reports no further questions at this time related to diabetes.  Thanks, Barnie Alderman, RN, MSN, CDE Diabetes Coordinator Inpatient Diabetes Program [phone] (Team Pager from 8am to 5pm)

## 2020-06-27 NOTE — ED Notes (Signed)
Pt eating breakfast, requested 1 side rail be down.

## 2020-06-27 NOTE — Progress Notes (Signed)
Patient is complaining of abdominal pain 10 out of 10 and would like something stronger because oxycodone does not help. Contacted Dr. Damita Dunnings regarding patient request. Awaiting orders.  Will continue to monitor.  Christene Slates  06/27/2020   10:39 PM

## 2020-06-27 NOTE — ED Notes (Signed)
When RN entered room pt was sleeping and completely still in bed. When RN woke pt to check CBG and administer 40 meq of K+ PO, pt became agitated and repeatedly requesting anxiety mediations. Pt took only 10 meq PO of potassium at this time and refused to take the additional 30 meq ordered. MD notified of pt request for anxiety meds a second time, and pt refusal to take potasium

## 2020-06-27 NOTE — ED Notes (Signed)
LR d5 inc to 150/hr

## 2020-06-27 NOTE — Progress Notes (Addendum)
Progress Note    Rachael Gray  OIN:[id number] DOB: 1980/12/27  DOA: 06/26/2020 PCP: [facility]      Brief Narrative:    Medical records reviewed and are as summarized below:  Rachael Gray is a 40 y.o. female with medical history significant for diabetes mellitus, gastroparesis, anemia, lupus, pseudoseizures, anxiety, marijuana abuse, tobacco use disorder, recurrent DKA, medical nonadherence, who presented to the hospital because of shaking and malaise.  She also complained of nausea, vomiting, throat and abdominal pain.  She said she ran out of insulin for about a week prior to admission.  She was found to have DKA.  She was treated with IV fluids and IV insulin infusion.  DKA has resolved.  She also had hypokalemia that was repleted.  Hospital course was complicated by hypoglycemia.  CT abdomen pelvis showed no esophageal thickening concerning for esophagitis.  Gastroenterologist was consulted for further evaluation.      Assessment/Plan:   Active Problems:   DKA (diabetic ketoacidosis) (HCC)   Body mass index is 22.14 kg/m.   Type I DM, s/p DKA, Hypoglycemia: DKA resolved.  Off of insulin infusion.  Continue Lantus and NovoLog.  The importance of medical adherence was emphasized.  Esophageal wall thickening,? esophagitis: Consulted gastroenterologist.  Plan for EGD tomorrow.  Nausea, vomiting and abdominal pain, history of gastroparesis: Analgesics and antiemetics as needed.  Hypokalemia: Replete potassium and monitor levels.  History of nonepileptic psychogenic seizures: She says she has not taken any Keppra since November 2021.  She was asked whether she wanted to resume Keppra and she answered in the affirmative.  Resume Keppra.   Other comorbidities include anxiety, lupus, collagen vascular disease   Diet Order            Diet NPO time specified Except for: Sips with Meds  Diet effective midnight           Diet Carb  Modified Fluid consistency: Thin; Room service appropriate? Yes  Diet effective now                    Consultants:  Gastroenterologist  Procedures:  None.  Plan for EGD tomorrow    Medications:   . atorvastatin  40 mg Oral QHS  . enoxaparin (LOVENOX) injection  40 mg Subcutaneous Q24H  . insulin aspart  0-5 Units Subcutaneous QHS  . insulin aspart  0-6 Units Subcutaneous TID WC  . insulin aspart  3 Units Subcutaneous TID WC  . insulin glargine  10 Units Subcutaneous QHS  . metoprolol tartrate  12.5 mg Oral BID   Continuous Infusions: . 0.9 % NaCl with KCl 40 mEq / L 50 mL/hr at 06/27/20 0900     Anti-infectives (From admission, onward)   None             Family Communication/Anticipated D/C date and plan/Code Status   DVT prophylaxis: enoxaparin (LOVENOX) injection 40 mg Start: 06/26/20 1600     Code Status: Full Code  Family Communication: None Disposition Plan:    Status is: Inpatient  Remains inpatient appropriate because:Inpatient level of care appropriate due to severity of illness   Dispo: The patient is from: Home              Anticipated d/c is to: Home              Patient currently is not medically stable to d/c.   Difficult to place patient No  Subjective:   C/o abdominal pain and anxiety.  Also reports itchy rash that she noticed about 2 to 3 days prior to admission.  She said she has not taken any Keppra since November 2021.  Objective:    Vitals:   06/27/20 0948 06/27/20 1000 06/27/20 1033 06/27/20 1230  BP:  106/73 127/87 120/79  Pulse:  (!) 101 (!) 115 (!) 101  Resp:  20 20 20   Temp: 98.4 F (36.9 C)  98.6 F (37 C) 98.1 F (36.7 C)  TempSrc: Oral  Oral Oral  SpO2:  99% 100% 100%  Weight:      Height:       No data found.  No intake or output data in the 24 hours ending 06/27/20 1309 Filed Weights   06/26/20 0708  Weight: 56.7 kg    Exam:  GEN: NAD SKIN: maculopapular rash on abdomen  and back (noticed this about 2 to 3 days prior to admission) EYES: EOMI ENT: MMM CV: RRR PULM: CTA B ABD: soft, ND, NT, +BS CNS: AAO x 3, non focal EXT: No edema or tenderness        Data Reviewed:   I have personally reviewed following labs and imaging studies:  Labs: Labs show the following:   Basic Metabolic Panel: Recent Labs  Lab 06/26/20 0908 06/26/20 1423 06/26/20 1836 06/26/20 2313 06/27/20 0204  NA 131* 136 136 134* 133*  K 2.9* 3.0* 3.1* 3.3* 2.9*  CL 88* 95* 97* 94* 97*  CO2 20* 21* 28 26 27   GLUCOSE 380* 145* 128* 66* 91  BUN 16 16 16 19 20   CREATININE 0.78 0.75 0.72 0.88 0.63  CALCIUM 9.4 9.9 9.6 9.4 9.2  MG  --   --   --   --  2.1  PHOS  --   --   --   --  2.8   GFR Estimated Creatinine Clearance: 78.9 mL/min (by C-G formula based on SCr of 0.63 mg/dL). Liver Function Tests: Recent Labs  Lab 06/26/20 0908  AST 13*  ALT 11  ALKPHOS 294*  BILITOT 1.5*  PROT 9.1*  ALBUMIN 3.6   Recent Labs  Lab 06/26/20 0908  LIPASE 30   No results for input(s): AMMONIA in the last 168 hours. Coagulation profile No results for input(s): INR, PROTIME in the last 168 hours.  CBC: Recent Labs  Lab 06/26/20 0729  WBC 11.6*  HGB 12.5  HCT 38.2  MCV 80.8  PLT 378   Cardiac Enzymes: No results for input(s): CKTOTAL, CKMB, CKMBINDEX, TROPONINI in the last 168 hours. BNP (last 3 results) No results for input(s): PROBNP in the last 8760 hours. CBG: Recent Labs  Lab 06/27/20 0314 06/27/20 0403 06/27/20 0605 06/27/20 0742 06/27/20 1005  GLUCAP 99 121* 156* 173* 183*   D-Dimer: No results for input(s): DDIMER in the last 72 hours. Hgb A1c: No results for input(s): HGBA1C in the last 72 hours. Lipid Profile: No results for input(s): CHOL, HDL, LDLCALC, TRIG, CHOLHDL, LDLDIRECT in the last 72 hours. Thyroid function studies: No results for input(s): TSH, T4TOTAL, T3FREE, THYROIDAB in the last 72 hours.  Invalid input(s): FREET3 Anemia work  up: No results for input(s): VITAMINB12, FOLATE, FERRITIN, TIBC, IRON, RETICCTPCT in the last 72 hours. Sepsis Labs: Recent Labs  Lab 06/26/20 0729 06/26/20 1836 06/27/20 0204  PROCALCITON  --  0.59 0.71  WBC 11.6*  --   --   LATICACIDVEN  --  1.7  --     Microbiology Recent  Results (from the past 240 hour(s))  Resp Panel by RT-PCR (Flu A&B, Covid) Nasopharyngeal Swab     Status: None   Collection Time: 06/26/20  1:37 PM   Specimen: Nasopharyngeal Swab; Nasopharyngeal(NP) swabs in vial transport medium  Result Value Ref Range Status   SARS Coronavirus 2 by RT PCR NEGATIVE NEGATIVE Final    Comment: (NOTE) SARS-CoV-2 target nucleic acids are NOT DETECTED.  The SARS-CoV-2 RNA is generally detectable in upper respiratory specimens during the acute phase of infection. The lowest concentration of SARS-CoV-2 viral copies this assay can detect is 138 copies/mL. A negative result does not preclude SARS-Cov-2 infection and should not be used as the sole basis for treatment or other patient management decisions. A negative result may occur with  improper specimen collection/handling, submission of specimen other than nasopharyngeal swab, presence of viral mutation(s) within the areas targeted by this assay, and inadequate number of viral copies(<138 copies/mL). A negative result must be combined with clinical observations, patient history, and epidemiological information. The expected result is Negative.  Fact Sheet for Patients:  EntrepreneurPulse.com.au  Fact Sheet for Healthcare Providers:  IncredibleEmployment.be  This test is no t yet approved or cleared by the [place] FDA and  has been authorized for detection and/or diagnosis of SARS-CoV-2 by FDA under an Emergency Use Authorization (EUA). This EUA will remain  in effect (meaning this test can be used) for the duration of the COVID-19 declaration under Section 564(b)(1) of the Act,  21 U.S.C.section 360bbb-3(b)(1), unless the authorization is terminated  or revoked sooner.       Influenza A by PCR NEGATIVE NEGATIVE Final   Influenza B by PCR NEGATIVE NEGATIVE Final    Comment: (NOTE) The Xpert Xpress SARS-CoV-2/FLU/RSV plus assay is intended as an aid in the diagnosis of influenza from Nasopharyngeal swab specimens and should not be used as a sole basis for treatment. Nasal washings and aspirates are unacceptable for Xpert Xpress SARS-CoV-2/FLU/RSV testing.  Fact Sheet for Patients: EntrepreneurPulse.com.au  Fact Sheet for Healthcare Providers: IncredibleEmployment.be  This test is not yet approved or cleared by the [place] FDA and has been authorized for detection and/or diagnosis of SARS-CoV-2 by FDA under an Emergency Use Authorization (EUA). This EUA will remain in effect (meaning this test can be used) for the duration of the COVID-19 declaration under Section 564(b)(1) of the Act, 21 U.S.C. section 360bbb-3(b)(1), unless the authorization is terminated or revoked.  Performed at [facility], [address], [place], [place] [address]     Procedures and diagnostic studies:  CT Abdomen Pelvis W Contrast  Result Date: 06/26/2020 CLINICAL DATA:  Acute generalized abdominal pain. EXAM: CT ABDOMEN AND PELVIS WITH CONTRAST TECHNIQUE: Multidetector CT imaging of the abdomen and pelvis was performed using the standard protocol following bolus administration of intravenous contrast. CONTRAST:  20mL OMNIPAQUE IOHEXOL 300 MG/ML  SOLN COMPARISON:  January 23, 2020. FINDINGS: Lower chest: Visualized lung bases are unremarkable. Wall thickening of distal esophagus is noted concerning for possible esophagitis. Hepatobiliary: No focal liver abnormality is seen. Status post cholecystectomy. No biliary dilatation. Pancreas: Unremarkable. No pancreatic ductal dilatation or surrounding inflammatory changes. Spleen: Normal  in size without focal abnormality. Adrenals/Urinary Tract: Adrenal glands are unremarkable. Kidneys are normal, without renal calculi, focal lesion, or hydronephrosis. Bladder is unremarkable. Stomach/Bowel: The stomach appears normal. There is no evidence of bowel obstruction or inflammation. Status post appendectomy. Vascular/Lymphatic: No significant vascular findings are present. No enlarged abdominal or pelvic lymph nodes. Reproductive: Uterus and  bilateral adnexa are unremarkable. Other: No abdominal wall hernia or abnormality. No abdominopelvic ascites. Musculoskeletal: No acute or significant osseous findings. IMPRESSION: Wall thickening of distal esophagus is noted concerning for possible esophagitis. Endoscopy is recommended for further evaluation. No other abnormality seen in the abdomen or pelvis. Electronically Signed   By: Marijo Conception M.D.   On: 06/26/2020 12:23   Portable chest x-ray (1 view)  Result Date: 06/26/2020 CLINICAL DATA:  Shaking EXAM: PORTABLE CHEST 1 VIEW COMPARISON:  None. FINDINGS: The heart size and mediastinal contours are within normal limits. Both lungs are clear. The visualized skeletal structures are unremarkable. IMPRESSION: No active disease. Electronically Signed   By: Prudencio Pair M.D.   On: 06/26/2020 15:41               LOS: 1 day   Tira Lafferty  [facility]   Pager on [email or web address]. If 7PM-7AM, please contact night-coverage at [email or web address]     06/27/2020, 1:09 PM

## 2020-06-27 NOTE — ED Notes (Addendum)
Pt called out and states "I have anxiety". Pt shaking arm when RN entered room. D5%LR decreased to 125 ml/hr at this time

## 2020-06-27 NOTE — ED Notes (Signed)
Dr. Damita Dunnings notified of cbg and steps following.

## 2020-06-27 NOTE — Consult Note (Signed)
Rachael Antigua, MD [address] [place], [place], [place], [address] [id number], [place], [place], [place], [address] Phone: [phone]  Fax: [phone]  Consultation  Referring Provider:     Dr. Mal Misty Primary Care Physician:  [facility] Reason for Consultation:     Esophagitis  Date of Admission:  06/26/2020 Date of Consultation:  06/27/2020         HPI:   Rachael Gray is a 40 y.o. female admitted with DKA, now off insulin drip, and on the floor on Lantus and NovoLog, with blood sugars better controlled, with GI being consulted on esophagitis seen on CT scan.  Patient reports 2-week history of nausea vomiting and epigastric abdominal pain, dull, 5 or 10, nonradiating.  CT shows wall thickening of the distal esophagus.  Patient has not had any recent upper endoscopy.  Reports a remote upper endoscopy, with procedure results not available.  Blood work does not show any anemia.  No episodes of active GI bleeding.  Previous records personally reviewed, and patient has had mild elevation total bilirubin chronically.  Alk phos was mildly elevated on admission as well.  It has not been repeated since then.  CT scan does not show any hepatobiliary abnormalities  Past Medical History:  Diagnosis Date  . Collagen vascular disease ([place])   . Diabetes mellitus without complication ([place])   . Gastroparesis   . Lupus ([place])   . Pseudoseizures ([place])   . Seizures ([place])     Past Surgical History:  Procedure Laterality Date  . APPENDECTOMY    . CHOLECYSTECTOMY      Prior to Admission medications   Medication Sig Start Date End Date Taking? Authorizing Provider  aspirin EC 81 MG EC tablet Take 1 tablet (81 mg total) by mouth daily. Swallow whole. 01/29/20  Yes Nicole Kindred A, DO  atorvastatin (LIPITOR) 40 MG tablet Take 1 tablet (40 mg total) by mouth at bedtime. 01/28/20  Yes Nicole Kindred A, DO  insulin NPH-regular Human (NOVOLIN 70/30)  (70-30) 100 UNIT/ML injection Inject 10 units under the skin twice daily with breakfast and dinner 01/28/20  Yes Nicole Kindred A, DO  metoprolol tartrate (LOPRESSOR) 25 MG tablet Take 0.5 tablets (12.5 mg total) by mouth 2 (two) times daily. 01/28/20  Yes Nicole Kindred A, DO  pantoprazole (PROTONIX) 40 MG tablet Take 1 tablet (40 mg total) by mouth 2 (two) times daily. 01/28/20  Yes Nicole Kindred A, DO  hydrocodone-ibuprofen (VICOPROFEN) 5-200 MG tablet Take 1 tablet by mouth every 8 (eight) hours as needed for pain. Patient not taking: No sig reported 01/23/20   Nena Polio, MD  levETIRAcetam (KEPPRA) 750 MG tablet Take 1 tablet (750 mg total) by mouth 2 (two) times daily. Patient not taking: No sig reported 08/21/15   Paulette Blanch, MD  methocarbamol (ROBAXIN) 500 MG tablet Take 1 tablet (500 mg total) by mouth every 8 (eight) hours as needed for muscle spasms. Patient not taking: No sig reported 01/28/20   Nicole Kindred A, DO  metoCLOPramide (REGLAN) 10 MG tablet Take 1 tablet (10 mg total) by mouth every 8 (eight) hours as needed. Patient not taking: No sig reported 02/25/17   Loney Hering, MD  ondansetron (ZOFRAN ODT) 4 MG disintegrating tablet Take 1 tablet (4 mg total) by mouth every 8 (eight) hours as needed for nausea or vomiting. Patient not taking: No sig reported 04/28/17   Rudene Re, MD  potassium chloride 20 MEQ  TBCR Take 40 mEq by mouth 2 (two) times daily for 10 days. 01/28/20 02/07/20  Ezekiel Slocumb, DO    Family History  Problem Relation Age of Onset  . Breast cancer Maternal Grandmother      Social History   Tobacco Use  . Smoking status: Current Every Day Smoker    Packs/day: 0.50    Years: 10.00    Pack years: 5.00    Types: Cigarettes  . Smokeless tobacco: Never Used  Vaping Use  . Vaping Use: Never used  Substance Use Topics  . Alcohol use: No  . Drug use: Yes    Types: Marijuana    Comment: yesterday    Allergies as of 06/26/2020 -  Review Complete 06/26/2020  Allergen Reaction Noted  . Dilantin [phenytoin sodium extended] Hives 11/29/2016  . Doxycycline Swelling 11/04/2014  . Other  03/01/2014  . Morphine and related Rash 12/10/2014  . Tylenol [acetaminophen] Rash 11/04/2014    Review of Systems:    All systems reviewed and negative except where noted in HPI.   Physical Exam:  Vital signs in last 24 hours: Vitals:   06/27/20 0800 06/27/20 0948 06/27/20 1000 06/27/20 1033  BP: 113/63  106/73 127/87  Pulse: (!) 101  (!) 101 (!) 115  Resp: _0 Temp:  98.4 F (36.9 C)  98.6 F (37 C)  TempSrc:  Oral  Oral  SpO2: 100%  99% 100%  Weight:      Height:         General:   Pleasant, cooperative in NAD Head:  Normocephalic and atraumatic. Eyes:   No icterus.   Conjunctiva pink. PERRLA. Ears:  Normal auditory acuity. Neck:  Supple; no masses or thyroidomegaly Lungs: Respirations even and unlabored. Lungs clear to auscultation bilaterally.   No wheezes, crackles, or rhonchi.  Abdomen:  Soft, nondistended, nontender. Normal bowel sounds. No appreciable masses or hepatomegaly.  No rebound or guarding.  Neurologic:  Alert and oriented x3;  grossly normal neurologically. Skin:  Intact without significant lesions or rashes. Cervical Nodes:  No significant cervical adenopathy. Psych:  Alert and cooperative. Normal affect.  LAB RESULTS: Recent Labs    06/26/20 0729  WBC 11.6*  HGB 12.5  HCT 38.2  PLT 378   BMET Recent Labs    06/26/20 1836 06/26/20 2313 06/27/20 0204  NA 136 134* 133*  K 3.1* 3.3* 2.9*  CL 97* 94* 97*  CO2 _1 GLUCOSE 128* 66* 91  BUN _2 CREATININE 0.72 0.88 0.63  CALCIUM 9.6 9.4 9.2   LFT Recent Labs    06/26/20 0908  PROT 9.1*  ALBUMIN 3.6  AST 13*  ALT 11  ALKPHOS 294*  BILITOT 1.5*   PT/INR No results for input(s): LABPROT, INR in the last 72 hours.  STUDIES: CT Abdomen Pelvis W Contrast  Result Date: 06/26/2020 CLINICAL DATA:  Acute  generalized abdominal pain. EXAM: CT ABDOMEN AND PELVIS WITH CONTRAST TECHNIQUE: Multidetector CT imaging of the abdomen and pelvis was performed using the standard protocol following bolus administration of intravenous contrast. CONTRAST:  39m OMNIPAQUE IOHEXOL 300 MG/ML  SOLN COMPARISON:  January 23, 2020. FINDINGS: Lower chest: Visualized lung bases are unremarkable. Wall thickening of distal esophagus is noted concerning for possible esophagitis. Hepatobiliary: No focal liver abnormality is seen. Status post cholecystectomy. No biliary dilatation. Pancreas: Unremarkable. No pancreatic ductal dilatation or surrounding inflammatory changes. Spleen: Normal in size without focal abnormality. Adrenals/Urinary Tract: Adrenal glands  are unremarkable. Kidneys are normal, without renal calculi, focal lesion, or hydronephrosis. Bladder is unremarkable. Stomach/Bowel: The stomach appears normal. There is no evidence of bowel obstruction or inflammation. Status post appendectomy. Vascular/Lymphatic: No significant vascular findings are present. No enlarged abdominal or pelvic lymph nodes. Reproductive: Uterus and bilateral adnexa are unremarkable. Other: No abdominal wall hernia or abnormality. No abdominopelvic ascites. Musculoskeletal: No acute or significant osseous findings. IMPRESSION: Wall thickening of distal esophagus is noted concerning for possible esophagitis. Endoscopy is recommended for further evaluation. No other abnormality seen in the abdomen or pelvis. Electronically Signed   By: Marijo Conception M.D.   On: 06/26/2020 12:23   Portable chest x-ray (1 view)  Result Date: 06/26/2020 CLINICAL DATA:  Shaking EXAM: PORTABLE CHEST 1 VIEW COMPARISON:  None. FINDINGS: The heart size and mediastinal contours are within normal limits. Both lungs are clear. The visualized skeletal structures are unremarkable. IMPRESSION: No active disease. Electronically Signed   By: Prudencio Pair M.D.   On: 06/26/2020 15:41       Impression / Plan:   Rachael Gray is a 40 y.o. y/o female with nausea vomiting, abdominal pain, with CT showing esophagitis  Given CT finding, EGD is indicated to rule out any underlying lesions in the distal esophagus  Patient is off insulin drip, and therefore medically optimized for upper endoscopy   However, she ate solid food today, and therefore upper endoscopy unable to be done today.  And will be planned for tomorrow.  Alternative options of conservative management were discussed in detail, including but not limited to medication management, foregoing endoscopic procedures at this time and others.    Patient is hemodynamically stable with no signs of active GI bleeding, no indication for urgent endoscopy, and can be scheduled for tomorrow  However, if patient develops any signs of active GI bleeding or any other reason for concern, please page GI on call for further evaluation at that time  I will also repeat liver enzymes, GGT , and obtain bilirubin fractionation  Avoid hepatotoxic drugs  Thank you for involving me in the care of this patient.      LOS: 1 day   Virgel Manifold, MD  06/27/2020, 11:40 AM

## 2020-06-28 ENCOUNTER — Encounter
Admission: EM | Disposition: A | Discharge status: Home / Self Care | Payer: Self-pay | Source: Home / Self Care | Attending: Internal Medicine

## 2020-06-28 ENCOUNTER — Inpatient Hospital Stay: Payer: Medicaid Other | Admitting: Anesthesiology

## 2020-06-28 DIAGNOSIS — E1069 Type 1 diabetes mellitus with other specified complication: Secondary | ICD-10-CM

## 2020-06-28 DIAGNOSIS — K319 Disease of stomach and duodenum, unspecified: Secondary | ICD-10-CM

## 2020-06-28 DIAGNOSIS — R109 Unspecified abdominal pain: Secondary | ICD-10-CM

## 2020-06-28 DIAGNOSIS — K209 Esophagitis, unspecified without bleeding: Secondary | ICD-10-CM

## 2020-06-28 DIAGNOSIS — E876 Hypokalemia: Secondary | ICD-10-CM

## 2020-06-28 DIAGNOSIS — R933 Abnormal findings on diagnostic imaging of other parts of digestive tract: Secondary | ICD-10-CM

## 2020-06-28 HISTORY — PX: ESOPHAGOGASTRODUODENOSCOPY: SHX5428

## 2020-06-28 LAB — GLUCOSE, CAPILLARY
Glucose-Capillary: 157 mg/dL — ABNORMAL HIGH (ref 70–99)
Glucose-Capillary: 61 mg/dL — ABNORMAL LOW (ref 70–99)
Glucose-Capillary: 265 mg/dL — ABNORMAL HIGH (ref 70–99)
Glucose-Capillary: 116 mg/dL — ABNORMAL HIGH (ref 70–99)

## 2020-06-28 LAB — CBC WITH DIFFERENTIAL/PLATELET
Abs Immature Granulocytes: 0.02 10*3/uL (ref 0.00–0.07)
Basophils Absolute: 0 10*3/uL (ref 0.0–0.1)
Basophils Relative: 1 %
Eosinophils Absolute: 0 10*3/uL (ref 0.0–0.5)
Eosinophils Relative: 0 %
HCT: 30 % — ABNORMAL LOW (ref 36.0–46.0)
Hemoglobin: 9.7 g/dL — ABNORMAL LOW (ref 12.0–15.0)
Immature Granulocytes: 0 %
Lymphocytes Relative: 32 %
Lymphs Abs: 1.8 10*3/uL (ref 0.7–4.0)
MCH: 26.7 pg (ref 26.0–34.0)
MCHC: 32.3 g/dL (ref 30.0–36.0)
MCV: 82.6 fL (ref 80.0–100.0)
Monocytes Absolute: 0.5 10*3/uL (ref 0.1–1.0)
Monocytes Relative: 9 %
Neutro Abs: 3.2 10*3/uL (ref 1.7–7.7)
Neutrophils Relative %: 58 %
Platelets: 281 10*3/uL (ref 150–400)
RBC: 3.63 MIL/uL — ABNORMAL LOW (ref 3.87–5.11)
RDW: 14.1 % (ref 11.5–15.5)
Smear Review: NORMAL
WBC: 5.5 10*3/uL (ref 4.0–10.5)
nRBC: 0 % (ref 0.0–0.2)

## 2020-06-28 LAB — BASIC METABOLIC PANEL
Anion gap: 6 (ref 5–15)
BUN: 15 mg/dL (ref 6–20)
CO2: 26 mmol/L (ref 22–32)
Calcium: 8.8 mg/dL — ABNORMAL LOW (ref 8.9–10.3)
Chloride: 104 mmol/L (ref 98–111)
Creatinine, Ser: 0.63 mg/dL (ref 0.44–1.00)
GFR, Estimated: >60 mL/min (ref >60)
Glucose, Bld: 147 mg/dL — ABNORMAL HIGH (ref 70–99)
Potassium: 3.2 mmol/L — ABNORMAL LOW (ref 3.5–5.1)
Sodium: 136 mmol/L (ref 135–145)

## 2020-06-28 LAB — PROCALCITONIN: Procalcitonin: 0.37 ng/mL

## 2020-06-28 LAB — PHOSPHORUS: Phosphorus: 3.4 mg/dL (ref 2.5–4.6)

## 2020-06-28 LAB — MAGNESIUM: Magnesium: 1.6 mg/dL — ABNORMAL LOW (ref 1.7–2.4)

## 2020-06-28 SURGERY — EGD (ESOPHAGOGASTRODUODENOSCOPY)
Anesthesia: General

## 2020-06-28 MED ORDER — SODIUM CHLORIDE 0.9 % IV SOLN: INTRAVENOUS | Status: DC

## 2020-06-28 MED ORDER — GLYCOPYRROLATE 0.2 MG/ML IJ SOLN
INTRAMUSCULAR | Status: DC | PRN
  Administered 2020-06-28: .2 mg via INTRAVENOUS

## 2020-06-28 MED ORDER — PANTOPRAZOLE SODIUM 40 MG IV SOLR
40.0000 mg | Freq: Two times a day (BID) | INTRAVENOUS | Status: DC
  Administered 2020-06-28 – 2020-06-30 (×5): 40 mg via INTRAVENOUS
  Filled 2020-06-28 (×5): qty 40

## 2020-06-28 MED ORDER — POTASSIUM CHLORIDE CRYS ER 20 MEQ PO TBCR
40.0000 meq | EXTENDED_RELEASE_TABLET | Freq: Two times a day (BID) | ORAL | Status: AC
  Administered 2020-06-28 (×2): 40 meq via ORAL
  Filled 2020-06-28 (×2): qty 2

## 2020-06-28 MED ORDER — SODIUM CHLORIDE 0.9 % IV SOLN
INTRAVENOUS | Status: DC
  Administered 2020-06-28: 20 mL/h via INTRAVENOUS

## 2020-06-28 MED ORDER — MIDAZOLAM HCL 2 MG/2ML IJ SOLN
INTRAMUSCULAR | Status: DC | PRN
  Administered 2020-06-28: 2 mg via INTRAVENOUS

## 2020-06-28 MED ORDER — MIDAZOLAM HCL 2 MG/2ML IJ SOLN
INTRAMUSCULAR | Status: AC
  Filled 2020-06-28: qty 2

## 2020-06-28 MED ORDER — LIDOCAINE HCL (CARDIAC) PF 100 MG/5ML IV SOSY
PREFILLED_SYRINGE | INTRAVENOUS | Status: DC | PRN
  Administered 2020-06-28: 100 mg via INTRAVENOUS

## 2020-06-28 MED ORDER — PROPOFOL 10 MG/ML IV BOLUS
INTRAVENOUS | Status: DC | PRN
  Administered 2020-06-28 (×2): 20 mg via INTRAVENOUS
  Administered 2020-06-28: 40 mg via INTRAVENOUS

## 2020-06-28 MED ORDER — PROPOFOL 500 MG/50ML IV EMUL
INTRAVENOUS | Status: DC | PRN
  Administered 2020-06-28: 155 ug/kg/min via INTRAVENOUS

## 2020-06-28 MED ORDER — MAGNESIUM SULFATE 2 GM/50ML IV SOLN
2.0000 g | Freq: Once | INTRAVENOUS | Status: AC
  Administered 2020-06-28: 2 g via INTRAVENOUS
  Filled 2020-06-28: qty 50

## 2020-06-28 NOTE — Anesthesia Procedure Notes (Signed)
Procedure Name: General with mask airway Performed by: Fletcher-Harrison, Donice Alperin, CRNA Pre-anesthesia Checklist: Patient identified, Emergency Drugs available, Suction available and Patient being monitored Patient Re-evaluated:Patient Re-evaluated prior to induction Oxygen Delivery Method: Simple face mask Induction Type: IV induction Placement Confirmation: positive ETCO2 and CO2 detector Dental Injury: Teeth and Oropharynx as per pre-operative assessment        

## 2020-06-28 NOTE — Progress Notes (Signed)
PROGRESS NOTE    Rachael Gray  LZJ:[id number] DOB: 1980-05-06 DOA: 06/26/2020 PCP: [facility]   Assessment & Plan:   Active Problems:   DKA (diabetic ketoacidosis) (HCC)   Abnormal CT scan, esophagus   Acute esophagitis   Gastric erythema  DMI: w/ DKA that is now resolved. Continue on lantus, SSI w/ accuchecks. Poorly controlled.   Esophageal wall thickening: EGD 06/28/20 showed Grade C reflux esophagitis w/ no bleeding. Biopsies were obtained in gastric body, incisura & gastric antrum. Started on IV PPI BID. Return to GI clinic in 4 weeks   Hx of gastroparesis: zofran prn  Hypokalemia: KCl repleted. Will continue to monitor  Hypomagnesemia: mg sulfate ordered. 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 Has not taken keppra since Nov 2021  DVT prophylaxis: lovenox  Code Status: full  Family Communication:  Disposition Plan: likely d/c back home   Level of care: Med-Surg   Status is: Inpatient  Remains inpatient appropriate because:Ongoing diagnostic testing needed not appropriate for outpatient work up, IV treatments appropriate due to intensity of illness or inability to take PO and Inpatient level of care appropriate due to severity of illness   Dispo: The patient is from: Home              Anticipated d/c is to: Home              Patient currently is not medically stable to d/c.   Difficult to place patient Yes        Consultants:   GI   Procedures:    Antimicrobials:    Subjective: Pt c/o abd pain   Objective: Vitals:   06/28/20 1205 06/28/20 1215 06/28/20 1225 06/28/20 1235  BP: (!) 88/55 110/67 118/75 120/74  Pulse: 77 76 75 68  Resp: 16 20 18 20   Temp: 98.2 F (36.8 C)     TempSrc: Temporal     SpO2: 100%  100% 100%  Weight:      Height:        Intake/Output Summary (Last 24 hours) at 06/28/2020 1308 Last data filed at 06/28/2020 1206 Gross per 24 hour  Intake  688.51 ml  Output 0 ml  Net 688.51 ml   Filed Weights   06/26/20 0708  Weight: 56.7 kg    Examination:  General exam: Appears calm but uncomfortable. Appears older than stated age Respiratory system: Clear to auscultation. Respiratory effort normal. Cardiovascular system: S1 & S2 +. No rubs, gallops or clicks.  Gastrointestinal system: Abdomen is nondistended, soft and nontender. Normal bowel sounds heard. Central nervous system: Alert and oriented. Moves all extremities  Psychiatry: Judgement and insight appear normal. Flat mood and affect     Data Reviewed: I have personally reviewed following labs and imaging studies  CBC: Recent Labs  Lab 06/26/20 0729 06/28/20 0915  WBC 11.6* 5.5  NEUTROABS  --  3.2  HGB 12.5 9.7*  HCT 38.2 30.0*  MCV 80.8 82.6  PLT 378 024   Basic Metabolic Panel: Recent Labs  Lab 06/26/20 1423 06/26/20 1836 06/26/20 2313 06/27/20 0204 06/28/20 0915  NA 136 136 134* 133* 136  K 3.0* 3.1* 3.3* 2.9* 3.2*  CL 95* 97* 94* 97* 104  CO2 21* 28 26 27 26   GLUCOSE 145* 128* 66* 91 147*  BUN 16 16 19 20 15   CREATININE 0.75 0.72 0.88 0.63 0.63  CALCIUM 9.9 9.6 9.4 9.2 8.8*  MG  --   --   --  2.1 1.6*  PHOS  --   --   --  2.8 3.4   GFR: Estimated Creatinine Clearance: 78.9 mL/min (by C-G formula based on SCr of 0.63 mg/dL). Liver Function Tests: Recent Labs  Lab 06/26/20 0908 06/27/20 1228  AST 13* 20  ALT 11 9  ALKPHOS 294* 229*  BILITOT 1.5* 0.7  PROT 9.1* 8.2*  ALBUMIN 3.6 3.3*   Recent Labs  Lab 06/26/20 0908  LIPASE 30   No results for input(s): AMMONIA in the last 168 hours. Coagulation Profile: No results for input(s): INR, PROTIME in the last 168 hours. Cardiac Enzymes: No results for input(s): CKTOTAL, CKMB, CKMBINDEX, TROPONINI in the last 168 hours. BNP (last 3 results) No results for input(s): PROBNP in the last 8760 hours. HbA1C: No results for input(s): HGBA1C in the last 72 hours. CBG: Recent Labs  Lab  06/27/20 1005 06/27/20 1727 06/27/20 2039 06/28/20 0755 06/28/20 1257  GLUCAP 183* 133* 152* 157* 61*   Lipid Profile: No results for input(s): CHOL, HDL, LDLCALC, TRIG, CHOLHDL, LDLDIRECT in the last 72 hours. Thyroid Function Tests: No results for input(s): TSH, T4TOTAL, FREET4, T3FREE, THYROIDAB in the last 72 hours. Anemia Panel: No results for input(s): VITAMINB12, FOLATE, FERRITIN, TIBC, IRON, RETICCTPCT in the last 72 hours. Sepsis Labs: Recent Labs  Lab 06/26/20 1836 06/27/20 0204 06/28/20 0915  PROCALCITON 0.59 0.71 0.37  LATICACIDVEN 1.7  --   --     Recent Results (from the past 240 hour(s))  Resp Panel by RT-PCR (Flu A&B, Covid) Nasopharyngeal Swab     Status: None   Collection Time: 06/26/20  1:37 PM   Specimen: Nasopharyngeal Swab; Nasopharyngeal(NP) swabs in vial transport medium  Result Value Ref Range Status   SARS Coronavirus 2 by RT PCR NEGATIVE NEGATIVE Final    Comment: (NOTE) SARS-CoV-2 target nucleic acids are NOT DETECTED.  The SARS-CoV-2 RNA is generally detectable in upper respiratory specimens during the acute phase of infection. The lowest concentration of SARS-CoV-2 viral copies this assay can detect is 138 copies/mL. A negative result does not preclude SARS-Cov-2 infection and should not be used as the sole basis for treatment or other patient management decisions. A negative result may occur with  improper specimen collection/handling, submission of specimen other than nasopharyngeal swab, presence of viral mutation(s) within the areas targeted by this assay, and inadequate number of viral copies(<138 copies/mL). A negative result must be combined with clinical observations, patient history, and epidemiological information. The expected result is Negative.  Fact Sheet for Patients:  EntrepreneurPulse.com.au  Fact Sheet for Healthcare Providers:  IncredibleEmployment.be  This test is no t yet approved  or cleared by the [place] FDA and  has been authorized for detection and/or diagnosis of SARS-CoV-2 by FDA under an Emergency Use Authorization (EUA). This EUA will remain  in effect (meaning this test can be used) for the duration of the COVID-19 declaration under Section 564(b)(1) of the Act, 21 U.S.C.section 360bbb-3(b)(1), unless the authorization is terminated  or revoked sooner.       Influenza A by PCR NEGATIVE NEGATIVE Final   Influenza B by PCR NEGATIVE NEGATIVE Final    Comment: (NOTE) The Xpert Xpress SARS-CoV-2/FLU/RSV plus assay is intended as an aid in the diagnosis of influenza from Nasopharyngeal swab specimens and should not be used as a sole basis for treatment. Nasal washings and aspirates are unacceptable for Xpert Xpress SARS-CoV-2/FLU/RSV testing.  Fact Sheet for Patients: EntrepreneurPulse.com.au  Fact Sheet for Healthcare Providers: IncredibleEmployment.be  This  test is not yet approved or cleared by the [place] and has been authorized for detection and/or diagnosis of SARS-CoV-2 by FDA under an Emergency Use Authorization (EUA). This EUA will remain in effect (meaning this test can be used) for the duration of the COVID-19 declaration under Section 564(b)(1) of the Act, 21 U.S.C. section 360bbb-3(b)(1), unless the authorization is terminated or revoked.  Performed at [facility], [address], [place], [place] [address]          Radiology Studies: Portable chest x-ray (1 view)  Result Date: 06/26/2020 CLINICAL DATA:  Shaking EXAM: PORTABLE CHEST 1 VIEW COMPARISON:  None. FINDINGS: The heart size and mediastinal contours are within normal limits. Both lungs are clear. The visualized skeletal structures are unremarkable. IMPRESSION: No active disease. Electronically Signed   By: Prudencio Pair M.D.   On: 06/26/2020 15:41        Scheduled Meds: . atorvastatin  40 mg Oral QHS  .  enoxaparin (LOVENOX) injection  40 mg Subcutaneous Q24H  . insulin aspart  0-5 Units Subcutaneous QHS  . insulin aspart  0-6 Units Subcutaneous TID WC  . insulin aspart  3 Units Subcutaneous TID WC  . insulin glargine  10 Units Subcutaneous QHS  . metoprolol tartrate  12.5 mg Oral BID  . pantoprazole (PROTONIX) IV  40 mg Intravenous Q12H  . potassium chloride  40 mEq Oral BID   Continuous Infusions: . 0.9 % NaCl with KCl 40 mEq / L Stopped (06/28/20 1151)  . magnesium sulfate bolus IVPB       LOS: 2 days    Time spent: 33 mins    Wyvonnia Dusky, MD [facility] Pager [phone]  If 7PM-7AM, please contact night-coverage 06/28/2020, 1:08 PM

## 2020-06-28 NOTE — Progress Notes (Signed)
Rachael Antigua, MD [address] [place], [place], [place], [address] [address], [place], [place], [place], [address] Phone: [phone]  Fax: [phone]   Subjective: Patient potassium has been replaced and improved to 3.2 today No acute events overnight.  Patient does report abdominal pain, dull, 5 or 10, nonradiating.  Objective: Exam: Vital signs in last 24 hours: Vitals:   06/27/20 1453 06/27/20 1729 06/27/20 2042 06/28/20 0820  BP: 138/77 110/66 111/61 99/60  Pulse: (!) 109 (!) 101 95 80  Resp: 19 16 20 18   Temp: 98.6 F (37 C) (!) 97.3 F (36.3 C) 98.7 F (37.1 C) 98.3 F (36.8 C)  TempSrc: Oral Oral Oral Oral  SpO2:  100% 100% 100%  Weight:      Height:       Weight change:   Intake/Output Summary (Last 24 hours) at 06/28/2020 1018 Last data filed at 06/28/2020 1749 Gross per 24 hour  Intake 488.51 ml  Output 0 ml  Net 488.51 ml    General: No acute distress, AAO x3 Abd: Soft, NT/ND, No HSM Skin: Warm, no rashes Neck: Supple, Trachea midline   Lab Results: Lab Results  Component Value Date   WBC 11.6 (H) 06/26/2020   HGB 12.5 06/26/2020   HCT 38.2 06/26/2020   MCV 80.8 06/26/2020   PLT 378 06/26/2020   Micro Results: Recent Results (from the past 240 hour(s))  Resp Panel by RT-PCR (Flu A&B, Covid) Nasopharyngeal Swab     Status: None   Collection Time: 06/26/20  1:37 PM   Specimen: Nasopharyngeal Swab; Nasopharyngeal(NP) swabs in vial transport medium  Result Value Ref Range Status   SARS Coronavirus 2 by RT PCR NEGATIVE NEGATIVE Final    Comment: (NOTE) SARS-CoV-2 target nucleic acids are NOT DETECTED.  The SARS-CoV-2 RNA is generally detectable in upper respiratory specimens during the acute phase of infection. The lowest concentration of SARS-CoV-2 viral copies this assay can detect is 138 copies/mL. A negative result does not preclude SARS-Cov-2 infection and should not be used as the sole basis for treatment or other  patient management decisions. A negative result may occur with  improper specimen collection/handling, submission of specimen other than nasopharyngeal swab, presence of viral mutation(s) within the areas targeted by this assay, and inadequate number of viral copies(<138 copies/mL). A negative result must be combined with clinical observations, patient history, and epidemiological information. The expected result is Negative.  Fact Sheet for Patients:  EntrepreneurPulse.com.au  Fact Sheet for Healthcare Providers:  IncredibleEmployment.be  This test is no t yet approved or cleared by the [place] FDA and  has been authorized for detection and/or diagnosis of SARS-CoV-2 by FDA under an Emergency Use Authorization (EUA). This EUA will remain  in effect (meaning this test can be used) for the duration of the COVID-19 declaration under Section 564(b)(1) of the Act, 21 U.S.C.section 360bbb-3(b)(1), unless the authorization is terminated  or revoked sooner.       Influenza A by PCR NEGATIVE NEGATIVE Final   Influenza B by PCR NEGATIVE NEGATIVE Final    Comment: (NOTE) The Xpert Xpress SARS-CoV-2/FLU/RSV plus assay is intended as an aid in the diagnosis of influenza from Nasopharyngeal swab specimens and should not be used as a sole basis for treatment. Nasal washings and aspirates are unacceptable for Xpert Xpress SARS-CoV-2/FLU/RSV testing.  Fact Sheet for Patients: EntrepreneurPulse.com.au  Fact Sheet for Healthcare Providers: IncredibleEmployment.be  This test is not yet approved or cleared by the [place] and  has been authorized for detection and/or diagnosis of SARS-CoV-2 by FDA under an Emergency Use Authorization (EUA). This EUA will remain in effect (meaning this test can be used) for the duration of the COVID-19 declaration under Section 564(b)(1) of the Act, 21 U.S.C. section  360bbb-3(b)(1), unless the authorization is terminated or revoked.  Performed at [facility], [address] [place], [place] [address]    Studies/Results: CT Abdomen Pelvis W Contrast  Result Date: 06/26/2020 CLINICAL DATA:  Acute generalized abdominal pain. EXAM: CT ABDOMEN AND PELVIS WITH CONTRAST TECHNIQUE: Multidetector CT imaging of the abdomen and pelvis was performed using the standard protocol following bolus administration of intravenous contrast. CONTRAST:  72m OMNIPAQUE IOHEXOL 300 MG/ML  SOLN COMPARISON:  January 23, 2020. FINDINGS: Lower chest: Visualized lung bases are unremarkable. Wall thickening of distal esophagus is noted concerning for possible esophagitis. Hepatobiliary: No focal liver abnormality is seen. Status post cholecystectomy. No biliary dilatation. Pancreas: Unremarkable. No pancreatic ductal dilatation or surrounding inflammatory changes. Spleen: Normal in size without focal abnormality. Adrenals/Urinary Tract: Adrenal glands are unremarkable. Kidneys are normal, without renal calculi, focal lesion, or hydronephrosis. Bladder is unremarkable. Stomach/Bowel: The stomach appears normal. There is no evidence of bowel obstruction or inflammation. Status post appendectomy. Vascular/Lymphatic: No significant vascular findings are present. No enlarged abdominal or pelvic lymph nodes. Reproductive: Uterus and bilateral adnexa are unremarkable. Other: No abdominal wall hernia or abnormality. No abdominopelvic ascites. Musculoskeletal: No acute or significant osseous findings. IMPRESSION: Wall thickening of distal esophagus is noted concerning for possible esophagitis. Endoscopy is recommended for further evaluation. No other abnormality seen in the abdomen or pelvis. Electronically Signed   By: JMarijo ConceptionM.D.   On: 06/26/2020 12:23   Portable chest x-ray (1 view)  Result Date: 06/26/2020 CLINICAL DATA:  Shaking EXAM: PORTABLE CHEST 1 VIEW COMPARISON:  None.  FINDINGS: The heart size and mediastinal contours are within normal limits. Both lungs are clear. The visualized skeletal structures are unremarkable. IMPRESSION: No active disease. Electronically Signed   By: BPrudencio PairM.D.   On: 06/26/2020 15:41   Medications:  Scheduled Meds: . atorvastatin  40 mg Oral QHS  . enoxaparin (LOVENOX) injection  40 mg Subcutaneous Q24H  . insulin aspart  0-5 Units Subcutaneous QHS  . insulin aspart  0-6 Units Subcutaneous TID WC  . insulin aspart  3 Units Subcutaneous TID WC  . insulin glargine  10 Units Subcutaneous QHS  . metoprolol tartrate  12.5 mg Oral BID   Continuous Infusions: . 0.9 % NaCl with KCl 40 mEq / L 50 mL/hr at 06/28/20 0445   PRN Meds:.dextrose, diphenhydrAMINE, hydrOXYzine, ketorolac, morphine injection, ondansetron (ZOFRAN) IV, oxyCODONE   Assessment: Active Problems:   DKA (diabetic ketoacidosis) (HCC) Abdominal pain Nausea vomiting Hypokalemia  Plan: Potassium levels have improved and patient is appropriate for upper endoscopy for evaluation of nausea vomiting, abdominal pain and abnormal CT of the esophagus  Total bilirubin is normal at 0.7, with normal direct bilirubin as well when checked yesterday afternoon.  However, alk phos and GGT are elevated  Would recommend right upper quadrant ultrasound for further evaluation, although CT scan does not show any biliary duct dilation or biliary duct obstruction  I have discussed alternative options, risks & benefits,  which include, but are not limited to, bleeding, infection, perforation,respiratory complication & drug reaction.  The patient agrees with this plan & written consent will be obtained.      LOS: 2 days   VVonda Antigua MD  06/28/2020, 10:18 AM

## 2020-06-28 NOTE — Transfer of Care (Signed)
Immediate Anesthesia Transfer of Care Note  Patient: Rachael Gray  Procedure(s) Performed: ESOPHAGOGASTRODUODENOSCOPY (EGD) (N/A )  Patient Location: Endoscopy Unit  Anesthesia Type:General  Level of Consciousness: drowsy and patient cooperative  Airway & Oxygen Therapy: Patient Spontanous Breathing and Patient connected to face mask oxygen  Post-op Assessment: Report given to RN and Post -op Vital signs reviewed and stable  Post vital signs: Reviewed and stable  Last Vitals:  Vitals Value Taken Time  BP    Temp    Pulse    Resp    SpO2      Last Pain:  Vitals:   06/28/20 1031  TempSrc: Temporal  PainSc:          Complications: No complications documented.

## 2020-06-28 NOTE — Plan of Care (Signed)
Patient is non-compliant with diabetes medication and nutrition.  Rachael Gray

## 2020-06-28 NOTE — Op Note (Signed)
[facility] Gastroenterology Patient Name: Rachael Gray Procedure Date: 06/28/2020 11:37 AM MRN: [id number] Account #: [id number] Date of Birth: 1980/08/19 Admit Type: Inpatient Age: 40 Room: [facility] ENDO ROOM 3 Gender: Female Note Status: Finalized Procedure:             Upper GI endoscopy Indications:           Epigastric abdominal pain, Nausea with vomiting Providers:             Rainah Kirshner B. Bonna Gains MD, MD Referring MD:          [facility] Medicines:             Monitored Anesthesia Care Complications:         No immediate complications. Procedure:             Pre-Anesthesia Assessment:                        - The risks and benefits of the procedure and the                         sedation options and risks were discussed with the                         patient. All questions were answered and informed                         consent was obtained.                        - Patient identification and proposed procedure were                         verified prior to the procedure.                        - ASA Grade Assessment: II - A patient with mild                         systemic disease.                        After obtaining informed consent, the endoscope was                         passed under direct vision. Throughout the procedure,                         the patient's blood pressure, pulse, and oxygen                         saturations were monitored continuously. The Endoscope                         was introduced through the mouth, and advanced to the                         second part of duodenum. The upper GI endoscopy was                         accomplished  with ease. The patient tolerated the                         procedure well. Findings:      LA Grade C (one or more mucosal breaks continuous between tops of 2 or       more mucosal folds, less than 75% circumference) esophagitis with no       bleeding was found in the distal  esophagus.      The exam of the esophagus was otherwise normal.      Patchy mildly erythematous mucosa without bleeding was found in the       gastric antrum. Biopsies were obtained in the gastric body, at the       incisura and in the gastric antrum with cold forceps for Helicobacter       pylori testing.      The exam of the stomach was otherwise normal.      The examined duodenum was normal. Impression:            - LA Grade C reflux esophagitis with no bleeding.                        - Erythematous mucosa in the antrum.                        - Normal examined duodenum.                        - Biopsies were obtained in the gastric body, at the                         incisura and in the gastric antrum. Recommendation:        - Await pathology results.                        - Return patient to hospital ward for ongoing care.                        - Continue present medications.                        - The findings and recommendations were discussed with                         the patient.                        - Follow an antireflux regimen.                        - Use Protonix (pantoprazole) 40 mg IV BID.                        - Return to GI clinic in 4 weeks. Procedure Code(s):     --- Professional ---                        [phone], Esophagogastroduodenoscopy, flexible,                         transoral; with biopsy, single or multiple Diagnosis Code(s):     ---  Professional ---                        K21.00, Gastro-esophageal reflux disease with                         esophagitis, without bleeding                        K31.89, Other diseases of stomach and duodenum                        R11.2, Nausea with vomiting, unspecified                        R10.13, Epigastric pain CPT copyright 2019 American Medical Association. All rights reserved. The codes documented in this report are preliminary and upon coder review may  be revised to meet current compliance  requirements.  [name], MD Margretta Sidle B. Bonna Gains MD, MD 06/28/2020 12:13:29 PM This report has been signed electronically. Number of Addenda: 0 Note Initiated On: 06/28/2020 11:37 AM Estimated Blood Loss:  Estimated blood loss: none.      [facility]

## 2020-06-28 NOTE — Anesthesia Preprocedure Evaluation (Addendum)
Anesthesia Evaluation  Patient identified by MRN, date of birth, ID band Patient awake    Reviewed: Allergy & Precautions, H&P , NPO status , Patient's Chart, lab work & pertinent test results  History of Anesthesia Complications Negative for: history of anesthetic complications  Airway Mallampati: II  TM Distance: >3 FB     Dental  (+) Poor Dentition, Chipped, Missing, Loose   Pulmonary neg sleep apnea, neg COPD, Current Smoker and Patient abstained from smoking.,    breath sounds clear to auscultation       Cardiovascular (-) angina(-) Past MI and (-) Cardiac Stents negative cardio ROS  (-) dysrhythmias  Rhythm:regular Rate:Normal     Neuro/Psych Seizures -,  PSYCHIATRIC DISORDERS Anxiety    GI/Hepatic negative GI ROS, (+)     substance abuse  marijuana use, Esophagitis Gastroparesis    Endo/Other  diabetes  Renal/GU negative Renal ROS  negative genitourinary   Musculoskeletal  (+) narcotic dependent  Abdominal   Peds  Hematology  (+) Blood dyscrasia, anemia ,   Anesthesia Other Findings Presented in DKA, now gap closed  Past Medical History: No date: Collagen vascular disease (HCC) No date: Diabetes mellitus without complication (HCC) No date: Gastroparesis No date: Lupus ([place]) 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[place]) No date: Seizures ([place])  Past Surgical History: No date: APPENDECTOMY No date: CHOLECYSTECTOMY  BMI    Body Mass Index: 22.14 kg/m      Reproductive/Obstetrics negative OB ROS                            Anesthesia Physical Anesthesia Plan  ASA: III  Anesthesia Plan: General   Post-op Pain Management:    Induction:   PONV Risk Score and Plan: Propofol infusion and TIVA  Airway Management Planned: Simple Face Mask  Additional Equipment:   Intra-op Plan:   Post-operative Plan:   Informed Consent: I have reviewed the patients History and Physical,  chart, labs and discussed the procedure including the risks, benefits and alternatives for the proposed anesthesia with the patient or authorized representative who has indicated his/her understanding and acceptance.     Dental Advisory Given  Plan Discussed with: Anesthesiologist, CRNA and Surgeon  Anesthesia Plan Comments:        Anesthesia Quick Evaluation

## 2020-06-28 NOTE — TOC Initial Note (Signed)
Transition of Care [facility]) - Initial/Assessment Note    Patient Details  Name: Rachael Gray MRN: [id number] Date of Birth: 1980-05-11  Transition of Care [facility]) CM/SW Contact:    Eileen Stanford, LCSW Phone Number: 06/28/2020, 2:29 PM  Clinical Narrative:                 Pt lives at home with adult daughter. Pt's daughter transports her to doctor appointments. Pt is working on established herself as a pt at [facility]--pt has contact. [facility] [place] [facility]. Pt states she was getting no services at home prior to admission-pt states she will not need any resources at home at d/c.   Expected Discharge Plan: Home/Self Care Barriers to Discharge: Continued Medical Work up   Patient Goals and CMS Choice Patient states their goals for this hospitalization and ongoing recovery are:: to go home   Choice offered to / list presented to : NA  Expected Discharge Plan and Services Expected Discharge Plan: Home/Self Care In-house Referral: NA   Post Acute Care Choice: NA Living arrangements for the past 2 months: Single Family Home                                      Prior Living Arrangements/Services Living arrangements for the past 2 months: Single Family Home Lives with:: Adult Children Patient language and need for interpreter reviewed:: Yes Do you feel safe going back to the place where you live?: Yes      Need for Family Participation in Patient Care: Yes (Comment) Care giver support system in place?: Yes (comment)   Criminal Activity/Legal Involvement Pertinent to Current Situation/Hospitalization: No - Comment as needed  Activities of Daily Living Home Assistive Devices/Equipment: None ADL Screening (condition at time of admission) Patient's cognitive ability adequate to safely complete daily activities?: Yes Is the patient deaf or have difficulty hearing?: No Does the patient have difficulty seeing, even when wearing  glasses/contacts?: No Does the patient have difficulty concentrating, remembering, or making decisions?: No Patient able to express need for assistance with ADLs?: No Does the patient have difficulty dressing or bathing?: No Independently performs ADLs?: Yes (appropriate for developmental age) Does the patient have difficulty walking or climbing stairs?: No Weakness of Legs: None Weakness of Arms/Hands: None  Permission Sought/Granted Permission sought to share information with : Family Supports    Share Information with NAME: ,Karma Ganja     Permission granted to share info w Relationship: Daughter     Emotional Assessment Appearance:: Appears stated age Attitude/Demeanor/Rapport: Engaged Affect (typically observed): Accepting Orientation: : Oriented to Self,Oriented to Place,Oriented to  Time,Oriented to Situation Alcohol / Substance Use: Not Applicable Psych Involvement: No (comment)  Admission diagnosis:  SOB (shortness of breath) [R06.02] DKA (diabetic ketoacidosis) ([place]) [E11.10] Seizure-like activity ([place]) [R56.9] Diabetic ketoacidosis without coma associated with type 2 diabetes mellitus ([place]) [E11.10] Patient Active Problem List   Diagnosis Date Noted  . Abnormal CT scan, esophagus   . Acute esophagitis   . Gastric erythema   . DKA (diabetic ketoacidosis) ([place]) 01/25/2020  . Tobacco abuse disorder 01/25/2020  . SIRS due to non-infectious process without acute organ dysfunction ([place]) 01/25/2020  . DKA, type 2 ([place]) 01/25/2020  . Cannabis use disorder, moderate, dependence ([place]) 12/01/2016  . 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 Hypokalemia 12/24/2015  . Anemia 12/24/2015  . Diabetes (  [place]) 12/24/2015  . Gastroparesis due to DM ([place])   . Acute respiratory alkalosis 12/22/2015  . Duodenitis 12/22/2015  . Vomiting 12/16/2015  . Nausea & vomiting 12/10/2014  . Narcotic abuse ([place]) 12/09/2014  . Sepsis ([place]) 12/07/2014  . Anxiety   . Pyelonephritis  12/04/2014  . Lactic acidosis 12/04/2014    Class: Acute   PCP:  [place]:   [id number] [place], [place] - [address] [place] [address] Phone: [phone] Fax: [phone]  [id number] [place], [address] [address] Phone: [phone] Fax: [phone]     Social Determinants of Health (SDOH) Interventions    Readmission Risk Interventions Readmission Risk Prevention Plan 06/28/2020  Transportation Screening Complete  PCP or Specialist Appt within 3-5 Days Complete  HRI or [place] Complete  Social Work Consult for [place] Planning/Counseling Complete  Palliative Care Screening Not Applicable  Medication Review Press photographer) Complete  Some recent data might be hidden

## 2020-06-28 NOTE — Plan of Care (Signed)

## 2020-06-29 ENCOUNTER — Encounter: Payer: Self-pay | Admitting: Gastroenterology

## 2020-06-29 ENCOUNTER — Inpatient Hospital Stay: Payer: Medicaid Other

## 2020-06-29 DIAGNOSIS — F445 Conversion disorder with seizures or convulsions: Secondary | ICD-10-CM

## 2020-06-29 LAB — BASIC METABOLIC PANEL
Anion gap: 5 (ref 5–15)
BUN: 14 mg/dL (ref 6–20)
CO2: 25 mmol/L (ref 22–32)
Calcium: 8.5 mg/dL — ABNORMAL LOW (ref 8.9–10.3)
Chloride: 106 mmol/L (ref 98–111)
Creatinine, Ser: 0.66 mg/dL (ref 0.44–1.00)
GFR, Estimated: >60 mL/min (ref >60)
Glucose, Bld: 211 mg/dL — ABNORMAL HIGH (ref 70–99)
Potassium: 4.9 mmol/L (ref 3.5–5.1)
Sodium: 136 mmol/L (ref 135–145)

## 2020-06-29 LAB — SURGICAL PATHOLOGY

## 2020-06-29 LAB — CBC
HCT: 29.5 % — ABNORMAL LOW (ref 36.0–46.0)
Hemoglobin: 9.2 g/dL — ABNORMAL LOW (ref 12.0–15.0)
MCH: 26.2 pg (ref 26.0–34.0)
MCHC: 31.2 g/dL (ref 30.0–36.0)
MCV: 84 fL (ref 80.0–100.0)
Platelets: 258 10*3/uL (ref 150–400)
RBC: 3.51 MIL/uL — ABNORMAL LOW (ref 3.87–5.11)
RDW: 14.3 % (ref 11.5–15.5)
WBC: 5.8 10*3/uL (ref 4.0–10.5)
nRBC: 0 % (ref 0.0–0.2)

## 2020-06-29 LAB — GLUCOSE, CAPILLARY
Glucose-Capillary: 198 mg/dL — ABNORMAL HIGH (ref 70–99)
Glucose-Capillary: 157 mg/dL — ABNORMAL HIGH (ref 70–99)
Glucose-Capillary: 87 mg/dL (ref 70–99)
Glucose-Capillary: 92 mg/dL (ref 70–99)
Glucose-Capillary: 206 mg/dL — ABNORMAL HIGH (ref 70–99)

## 2020-06-29 LAB — MAGNESIUM: Magnesium: 1.7 mg/dL (ref 1.7–2.4)

## 2020-06-29 MED ORDER — OXYCODONE HCL 5 MG PO TABS
5.0000 mg | ORAL_TABLET | Freq: Four times a day (QID) | ORAL | Status: DC | PRN
  Filled 2020-06-29: qty 1

## 2020-06-29 NOTE — Progress Notes (Signed)
Pt. Alert and oriented. As per patient request nursing staff should have less frequent rounds to her room. Pt. Reported that frequent in and out of the room is disturbing her and interrupting her rest time. Pt. Request was well noted. Vital sign was not completed for 0400. Patient is currently sleeping. No acute changes at this time. Continue with pain management.

## 2020-06-29 NOTE — Anesthesia Postprocedure Evaluation (Signed)
Anesthesia Post Note  Patient: Rachael Gray  Procedure(s) Performed: ESOPHAGOGASTRODUODENOSCOPY (EGD) (N/A )  Patient location during evaluation: PACU Anesthesia Type: General Level of consciousness: awake and alert Pain management: pain level controlled Vital Signs Assessment: post-procedure vital signs reviewed and stable Respiratory status: spontaneous breathing, nonlabored ventilation and respiratory function stable Cardiovascular status: blood pressure returned to baseline and stable Postop Assessment: no apparent nausea or vomiting Anesthetic complications: no   No complications documented.   Last Vitals:  Vitals:   06/28/20 1633 06/28/20 2007  BP: 123/79 108/70  Pulse: 100 89  Resp: 18 14  Temp: 36.7 C 36.7 C  SpO2: 100% 100%    Last Pain:  Vitals:   06/29/20 0332  TempSrc:   PainSc: [place]

## 2020-06-29 NOTE — Progress Notes (Signed)
Patient asked to take Oxycodone instead of IV morphine but she refused to take Oxy. Patient stated "that does not do anything for her, she would rather not take anything and stay in pain".   Education provided that going home and be on IV medication is not a good option. We need to transition to PO med's. But she is keep refusing it. IV morphine given with benadryl as patient requested.

## 2020-06-29 NOTE — Plan of Care (Signed)

## 2020-06-29 NOTE — Progress Notes (Addendum)
Rachael Antigua, MD [address] [place], [place], [place], [address] [address], [place], [place], [place], [address] Phone: [phone]  Fax: [phone]   Subjective: Patient reports improvement in abdominal pain.  No nausea or vomiting today.   Objective: Exam: Vital signs in last 24 hours: Vitals:   06/28/20 1235 06/28/20 1633 06/28/20 2007 06/29/20 0758  BP: 120/74 123/79 108/70 131/85  Pulse: 68 100 89 88  Resp: _0 Temp:  98.1 F (36.7 C) 98.1 F (36.7 C) 98.1 F (36.7 C)  TempSrc:  Oral Oral Oral  SpO2: 100% 100% 100% 100%  Weight:      Height:       Weight change:   Intake/Output Summary (Last 24 hours) at 06/29/2020 1121 Last data filed at 06/29/2020 1010 Gross per 24 hour  Intake 1602.25 ml  Output --  Net 1602.25 ml    General: No acute distress, AAO x3 Abd: Soft, NT/ND, No HSM Skin: Warm, no rashes Neck: Supple, Trachea midline   Lab Results: Lab Results  Component Value Date   WBC 5.8 06/29/2020   HGB 9.2 (L) 06/29/2020   HCT 29.5 (L) 06/29/2020   MCV 84.0 06/29/2020   PLT 258 06/29/2020   Micro Results: Recent Results (from the past 240 hour(s))  Resp Panel by RT-PCR (Flu A&B, Covid) Nasopharyngeal Swab     Status: None   Collection Time: 06/26/20  1:37 PM   Specimen: Nasopharyngeal Swab; Nasopharyngeal(NP) swabs in vial transport medium  Result Value Ref Range Status   SARS Coronavirus 2 by RT PCR NEGATIVE NEGATIVE Final    Comment: (NOTE) SARS-CoV-2 target nucleic acids are NOT DETECTED.  The SARS-CoV-2 RNA is generally detectable in upper respiratory specimens during the acute phase of infection. The lowest concentration of SARS-CoV-2 viral copies this assay can detect is 138 copies/mL. A negative result does not preclude SARS-Cov-2 infection and should not be used as the sole basis for treatment or other patient management decisions. A negative result may occur with  improper specimen collection/handling,  submission of specimen other than nasopharyngeal swab, presence of viral mutation(s) within the areas targeted by this assay, and inadequate number of viral copies(<138 copies/mL). A negative result must be combined with clinical observations, patient history, and epidemiological information. The expected result is Negative.  Fact Sheet for Patients:  EntrepreneurPulse.com.au  Fact Sheet for Healthcare Providers:  IncredibleEmployment.be  This test is no t yet approved or cleared by the [place] FDA and  has been authorized for detection and/or diagnosis of SARS-CoV-2 by FDA under an Emergency Use Authorization (EUA). This EUA will remain  in effect (meaning this test can be used) for the duration of the COVID-19 declaration under Section 564(b)(1) of the Act, 21 U.S.C.section 360bbb-3(b)(1), unless the authorization is terminated  or revoked sooner.       Influenza A by PCR NEGATIVE NEGATIVE Final   Influenza B by PCR NEGATIVE NEGATIVE Final    Comment: (NOTE) The Xpert Xpress SARS-CoV-2/FLU/RSV plus assay is intended as an aid in the diagnosis of influenza from Nasopharyngeal swab specimens and should not be used as a sole basis for treatment. Nasal washings and aspirates are unacceptable for Xpert Xpress SARS-CoV-2/FLU/RSV testing.  Fact Sheet for Patients: EntrepreneurPulse.com.au  Fact Sheet for Healthcare Providers: IncredibleEmployment.be  This test is not yet approved or cleared by the [place] FDA and has been authorized for detection and/or diagnosis of SARS-CoV-2 by FDA under an Emergency Use Authorization (EUA). This  EUA will remain in effect (meaning this test can be used) for the duration of the COVID-19 declaration under Section 564(b)(1) of the Act, 21 U.S.C. section 360bbb-3(b)(1), unless the authorization is terminated or revoked.  Performed at [facility], [address], [place], [place] [address]    Studies/Results: No results found. Medications:  Scheduled Meds: . atorvastatin  40 mg Oral QHS  . enoxaparin (LOVENOX) injection  40 mg Subcutaneous Q24H  . insulin aspart  0-5 Units Subcutaneous QHS  . insulin aspart  0-6 Units Subcutaneous TID WC  . insulin aspart  3 Units Subcutaneous TID WC  . insulin glargine  10 Units Subcutaneous QHS  . metoprolol tartrate  12.5 mg Oral BID  . pantoprazole (PROTONIX) IV  40 mg Intravenous Q12H   Continuous Infusions: . 0.9 % NaCl with KCl 40 mEq / L 50 mL/hr at 06/29/20 0343   PRN Meds:.dextrose, diphenhydrAMINE, hydrOXYzine, morphine injection, ondansetron (ZOFRAN) IV, oxyCODONE   Assessment: Active Problems:   DKA (diabetic ketoacidosis) (HCC)   Abnormal CT scan, esophagus   Acute esophagitis   Gastric erythema    Plan: Abdominal pain improving Continue PPI twice daily for 2 to 3 months Hemoglobin has remained stable in 48 hours Keep head of bed elevated to 30 degrees at all times Antireflux measures discussed  Right upper quadrant ultrasound ordered for elevated alk phos with elevated GGT   LOS: 3 days   Rachael Antigua, MD 06/29/2020, 11:21 AM

## 2020-06-29 NOTE — Progress Notes (Addendum)
PROGRESS NOTE   HPI was taken from Dr. Marcello Moores: Rachael Gray is a 40 y.o. female  with past medical history of diabetes,gastroparesis,anemia,Lupus,pseudoseizures,anxiety,.marijuana use, tobacco use,  recurrent DKA who presents to ed with complaint of shaking and vague complaint of note feeling well. On evaluation found to have mild DKA s/p which patient was started on dka protocol/insulin drip. Patient states she has been ill for around two weeks with n/v/and abdominal pain with fever and chills. However she denies cough, diarrhea, dysuria , noncompliance with her insulin , she noted significant fatigue and malaise.      Rachael Gray  NWG:[id number] DOB: 1980/08/12 DOA: 06/26/2020 PCP: [facility]   Assessment & Plan:   Active Problems:   DKA (diabetic ketoacidosis) (HCC)   Abnormal CT scan, esophagus   Acute esophagitis   Gastric erythema  DMI: w/ DKA which is now resolved. Continue on lantus, SSI w/ accuchecks. Poorly controlled.   Esophageal wall thickening: EGD 06/28/20 showed Grade C reflux esophagitis w/ no bleeding. Biopsies were obtained in gastric body, incisura & gastric antrum. Continue on  IV PPI BID. Return to GI clinic in 4 weeks  Elevated alkaline phosphatase: etiology unclear. RUQ [place] ordered as per GI   Hx of gastroparesis: improving. Zofran prn   Hypokalemia: WNL today. Will continue to monitor   Hypomagnesemia: WNL today. 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 Has not taken keppra since Nov 2021  DVT prophylaxis: lovenox  Code Status: full  Family Communication:  Disposition Plan: likely d/c back home   Level of care: Med-Surg   Status is: Inpatient  Remains inpatient appropriate because:Ongoing diagnostic testing needed not appropriate for outpatient work up, IV treatments appropriate due to intensity of illness or inability to take PO and Inpatient level of care  appropriate due to severity of illness can d/c home tomorrow w/ pantoprazole BID and po pain meds if pain has improved    Dispo: The patient is from: Home              Anticipated d/c is to: Home              Patient currently is not medically stable to d/c.   Difficult to place patient Yes        Consultants:   GI   Procedures:    Antimicrobials:    Subjective: Pt still c/o abd pain slightly improved from day prior   Objective: Vitals:   06/28/20 1225 06/28/20 1235 06/28/20 1633 06/28/20 2007  BP: 118/75 120/74 123/79 108/70  Pulse: 75 68 100 89  Resp: 18 20 18 14   Temp:   98.1 F (36.7 C) 98.1 F (36.7 C)  TempSrc:   Oral Oral  SpO2: 100% 100% 100% 100%  Weight:      Height:        Intake/Output Summary (Last 24 hours) at 06/29/2020 0735 Last data filed at 06/28/2020 1847 Gross per 24 hour  Intake 1362.25 ml  Output --  Net 1362.25 ml   Filed Weights   06/26/20 0708  Weight: 56.7 kg    Examination:  General exam: Appears comfortable. Appears older than stated age  Respiratory system: clear breath sounds b/l. No wheezes, rales, rhonchi  Cardiovascular system: S1/S2+. No rubs or clicks  Gastrointestinal system: Abd is soft, tenderness to palpation, ND & hypoactive bowel sounds  Central nervous system: Alert and oriented. Moves all extremities  Psychiatry: judgement and insight  appear normal. Flat mood and affect     Data Reviewed: I have personally reviewed following labs and imaging studies  CBC: Recent Labs  Lab 06/26/20 0729 06/28/20 0915 06/29/20 0608  WBC 11.6* 5.5 5.8  NEUTROABS  --  3.2  --   HGB 12.5 9.7* 9.2*  HCT 38.2 30.0* 29.5*  MCV 80.8 82.6 84.0  PLT 378 281 283   Basic Metabolic Panel: Recent Labs  Lab 06/26/20 1836 06/26/20 2313 06/27/20 0204 06/28/20 0915 06/29/20 0608  NA 136 134* 133* 136 136  K 3.1* 3.3* 2.9* 3.2* 4.9  CL 97* 94* 97* 104 106  CO2 28 26 27 26 25   GLUCOSE 128* 66* 91 147* 211*  BUN 16 19 20 15  14   CREATININE 0.72 0.88 0.63 0.63 0.66  CALCIUM 9.6 9.4 9.2 8.8* 8.5*  MG  --   --  2.1 1.6* 1.7  PHOS  --   --  2.8 3.4  --    GFR: Estimated Creatinine Clearance: 78.9 mL/min (by C-G formula based on SCr of 0.66 mg/dL). Liver Function Tests: Recent Labs  Lab 06/26/20 0908 06/27/20 1228  AST 13* 20  ALT 11 9  ALKPHOS 294* 229*  BILITOT 1.5* 0.7  PROT 9.1* 8.2*  ALBUMIN 3.6 3.3*   Recent Labs  Lab 06/26/20 0908  LIPASE 30   No results for input(s): AMMONIA in the last 168 hours. Coagulation Profile: No results for input(s): INR, PROTIME in the last 168 hours. Cardiac Enzymes: No results for input(s): CKTOTAL, CKMB, CKMBINDEX, TROPONINI in the last 168 hours. BNP (last 3 results) No results for input(s): PROBNP in the last 8760 hours. HbA1C: No results for input(s): HGBA1C in the last 72 hours. CBG: Recent Labs  Lab 06/27/20 2039 06/28/20 0755 06/28/20 1257 06/28/20 1630 06/28/20 2108  GLUCAP 152* 157* 61* 265* 116*   Lipid Profile: No results for input(s): CHOL, HDL, LDLCALC, TRIG, CHOLHDL, LDLDIRECT in the last 72 hours. Thyroid Function Tests: No results for input(s): TSH, T4TOTAL, FREET4, T3FREE, THYROIDAB in the last 72 hours. Anemia Panel: No results for input(s): VITAMINB12, FOLATE, FERRITIN, TIBC, IRON, RETICCTPCT in the last 72 hours. Sepsis Labs: Recent Labs  Lab 06/26/20 1836 06/27/20 0204 06/28/20 0915  PROCALCITON 0.59 0.71 0.37  LATICACIDVEN 1.7  --   --     Recent Results (from the past 240 hour(s))  Resp Panel by RT-PCR (Flu A&B, Covid) Nasopharyngeal Swab     Status: None   Collection Time: 06/26/20  1:37 PM   Specimen: Nasopharyngeal Swab; Nasopharyngeal(NP) swabs in vial transport medium  Result Value Ref Range Status   SARS Coronavirus 2 by RT PCR NEGATIVE NEGATIVE Final    Comment: (NOTE) SARS-CoV-2 target nucleic acids are NOT DETECTED.  The SARS-CoV-2 RNA is generally detectable in upper respiratory specimens during the  acute phase of infection. The lowest concentration of SARS-CoV-2 viral copies this assay can detect is 138 copies/mL. A negative result does not preclude SARS-Cov-2 infection and should not be used as the sole basis for treatment or other patient management decisions. A negative result may occur with  improper specimen collection/handling, submission of specimen other than nasopharyngeal swab, presence of viral mutation(s) within the areas targeted by this assay, and inadequate number of viral copies(<138 copies/mL). A negative result must be combined with clinical observations, patient history, and epidemiological information. The expected result is Negative.  Fact Sheet for Patients:  EntrepreneurPulse.com.au  Fact Sheet for Healthcare Providers:  IncredibleEmployment.be  This test is no  t yet approved or cleared by the [place] and  has been authorized for detection and/or diagnosis of SARS-CoV-2 by FDA under an Emergency Use Authorization (EUA). This EUA will remain  in effect (meaning this test can be used) for the duration of the COVID-19 declaration under Section 564(b)(1) of the Act, 21 U.S.C.section 360bbb-3(b)(1), unless the authorization is terminated  or revoked sooner.       Influenza A by PCR NEGATIVE NEGATIVE Final   Influenza B by PCR NEGATIVE NEGATIVE Final    Comment: (NOTE) The Xpert Xpress SARS-CoV-2/FLU/RSV plus assay is intended as an aid in the diagnosis of influenza from Nasopharyngeal swab specimens and should not be used as a sole basis for treatment. Nasal washings and aspirates are unacceptable for Xpert Xpress SARS-CoV-2/FLU/RSV testing.  Fact Sheet for Patients: EntrepreneurPulse.com.au  Fact Sheet for Healthcare Providers: IncredibleEmployment.be  This test is not yet approved or cleared by the [place] FDA and has been authorized for detection and/or  diagnosis of SARS-CoV-2 by FDA under an Emergency Use Authorization (EUA). This EUA will remain in effect (meaning this test can be used) for the duration of the COVID-19 declaration under Section 564(b)(1) of the Act, 21 U.S.C. section 360bbb-3(b)(1), unless the authorization is terminated or revoked.  Performed at [facility], [address] [place], [place] [address]          Radiology Studies: No results found.      Scheduled Meds: . atorvastatin  40 mg Oral QHS  . enoxaparin (LOVENOX) injection  40 mg Subcutaneous Q24H  . insulin aspart  0-5 Units Subcutaneous QHS  . insulin aspart  0-6 Units Subcutaneous TID WC  . insulin aspart  3 Units Subcutaneous TID WC  . insulin glargine  10 Units Subcutaneous QHS  . metoprolol tartrate  12.5 mg Oral BID  . pantoprazole (PROTONIX) IV  40 mg Intravenous Q12H   Continuous Infusions: . 0.9 % NaCl with KCl 40 mEq / L 50 mL/hr at 06/29/20 0343     LOS: 3 days    Time spent: 30 mins    Wyvonnia Dusky, MD [facility] Pager [phone]  If 7PM-7AM, please contact night-coverage 06/29/2020, 7:35 AM

## 2020-06-30 LAB — COMPREHENSIVE METABOLIC PANEL
ALT: 47 U/L — ABNORMAL HIGH (ref 0–44)
AST: 53 U/L — ABNORMAL HIGH (ref 15–41)
Albumin: 2.8 g/dL — ABNORMAL LOW (ref 3.5–5.0)
Alkaline Phosphatase: 253 U/L — ABNORMAL HIGH (ref 38–126)
Anion gap: 9 (ref 5–15)
BUN: 11 mg/dL (ref 6–20)
CO2: 24 mmol/L (ref 22–32)
Calcium: 9.2 mg/dL (ref 8.9–10.3)
Chloride: 103 mmol/L (ref 98–111)
Creatinine, Ser: 0.62 mg/dL (ref 0.44–1.00)
GFR, Estimated: >60 mL/min (ref >60)
Glucose, Bld: 161 mg/dL — ABNORMAL HIGH (ref 70–99)
Potassium: 4.4 mmol/L (ref 3.5–5.1)
Sodium: 136 mmol/L (ref 135–145)
Total Bilirubin: 0.4 mg/dL (ref 0.3–1.2)
Total Protein: 7.1 g/dL (ref 6.5–8.1)

## 2020-06-30 LAB — CBC
HCT: 32.7 % — ABNORMAL LOW (ref 36.0–46.0)
Hemoglobin: 10.3 g/dL — ABNORMAL LOW (ref 12.0–15.0)
MCH: 26.1 pg (ref 26.0–34.0)
MCHC: 31.5 g/dL (ref 30.0–36.0)
MCV: 83 fL (ref 80.0–100.0)
Platelets: 275 10*3/uL (ref 150–400)
RBC: 3.94 MIL/uL (ref 3.87–5.11)
RDW: 14.2 % (ref 11.5–15.5)
WBC: 7.1 10*3/uL (ref 4.0–10.5)
nRBC: 0 % (ref 0.0–0.2)

## 2020-06-30 LAB — HEPATITIS PANEL, ACUTE
HCV Ab: NONREACTIVE
Hep A IgM: NONREACTIVE
Hep B C IgM: NONREACTIVE
Hepatitis B Surface Ag: NONREACTIVE

## 2020-06-30 LAB — GLUCOSE, CAPILLARY
Glucose-Capillary: 168 mg/dL — ABNORMAL HIGH (ref 70–99)
Glucose-Capillary: 196 mg/dL — ABNORMAL HIGH (ref 70–99)

## 2020-06-30 LAB — MAGNESIUM: Magnesium: 1.5 mg/dL — ABNORMAL LOW (ref 1.7–2.4)

## 2020-06-30 MED ORDER — METOPROLOL TARTRATE 25 MG PO TABS: 12.5000 mg | ORAL_TABLET | Freq: Two times a day (BID) | ORAL | 0 refills | Status: DC

## 2020-06-30 MED ORDER — MAGNESIUM SULFATE 2 GM/50ML IV SOLN
2.0000 g | Freq: Once | INTRAVENOUS | Status: AC
  Administered 2020-06-30: 2 g via INTRAVENOUS
  Filled 2020-06-30: qty 50

## 2020-06-30 MED ORDER — OXYCODONE HCL 5 MG PO TABS: 5.0000 mg | ORAL_TABLET | Freq: Four times a day (QID) | ORAL | 0 refills | Status: AC | PRN

## 2020-06-30 MED ORDER — PANTOPRAZOLE SODIUM 40 MG PO TBEC: 40.0000 mg | DELAYED_RELEASE_TABLET | Freq: Two times a day (BID) | ORAL | 1 refills | Status: DC

## 2020-06-30 NOTE — Progress Notes (Signed)
Discharge instructions and medication changes reviewed with patient prior to discharge who verbalized understanding.  Sister arrived with patient clothes and to transport home.

## 2020-06-30 NOTE — Progress Notes (Signed)
Patient states she is angry with dietary due to sending chicken that was overcooked and could not be eaten however she had eaten some from her tray and was told due to her restriction could not send a full tray to her.  She became very upset and stated she did not want anything additional to eat and wanted to be discharged. Offered other alternatives which patient declined stating she would be leaving when her ride came.  Discharge orders were already in for this patient.  PIVs removed and awaiting transport.

## 2020-06-30 NOTE — Progress Notes (Signed)
Rachael Antigua, MD [address], [place], [place], [place], [address] [address], [place], [place], [place], [address] Phone: [phone]  Fax: [phone]   Subjective: Patient sitting upright in bed, comfortably watching TV.  Denies any abdominal pain.  No nausea or vomiting.   Objective: Exam: Vital signs in last 24 hours: Vitals:   06/29/20 1926 06/30/20 0400 06/30/20 0835 06/30/20 1106  BP: (!) 123/91 117/70 132/76 108/72  Pulse: (!) 106 77 87 80  Resp: 20 18 18 16   Temp: 97.7 F (36.5 C) 98.8 F (37.1 C) 97.9 F (36.6 C) 98.3 F (36.8 C)  TempSrc: Oral Oral Oral Oral  SpO2: 100% 100% 100% 100%  Weight:      Height:       Weight change:   Intake/Output Summary (Last 24 hours) at 06/30/2020 1323 Last data filed at 06/30/2020 1015 Gross per 24 hour  Intake 420 ml  Output 0 ml  Net 420 ml    General: No acute distress, AAO x3 Abd: Soft, NT/ND, No HSM Skin: Warm, no rashes Neck: Supple, Trachea midline   Lab Results: Lab Results  Component Value Date   WBC 7.1 06/30/2020   HGB 10.3 (L) 06/30/2020   HCT 32.7 (L) 06/30/2020   MCV 83.0 06/30/2020   PLT 275 06/30/2020   Micro Results: Recent Results (from the past 240 hour(s))  Resp Panel by RT-PCR (Flu A&B, Covid) Nasopharyngeal Swab     Status: None   Collection Time: 06/26/20  1:37 PM   Specimen: Nasopharyngeal Swab; Nasopharyngeal(NP) swabs in vial transport medium  Result Value Ref Range Status   SARS Coronavirus 2 by RT PCR NEGATIVE NEGATIVE Final    Comment: (NOTE) SARS-CoV-2 target nucleic acids are NOT DETECTED.  The SARS-CoV-2 RNA is generally detectable in upper respiratory specimens during the acute phase of infection. The lowest concentration of SARS-CoV-2 viral copies this assay can detect is 138 copies/mL. A negative result does not preclude SARS-Cov-2 infection and should not be used as the sole basis for treatment or other patient management decisions. A negative result may  occur with  improper specimen collection/handling, submission of specimen other than nasopharyngeal swab, presence of viral mutation(s) within the areas targeted by this assay, and inadequate number of viral copies(<138 copies/mL). A negative result must be combined with clinical observations, patient history, and epidemiological information. The expected result is Negative.  Fact Sheet for Patients:  EntrepreneurPulse.com.au  Fact Sheet for Healthcare Providers:  IncredibleEmployment.be  This test is no t yet approved or cleared by the [place] FDA and  has been authorized for detection and/or diagnosis of SARS-CoV-2 by FDA under an Emergency Use Authorization (EUA). This EUA will remain  in effect (meaning this test can be used) for the duration of the COVID-19 declaration under Section 564(b)(1) of the Act, 21 U.S.C.section 360bbb-3(b)(1), unless the authorization is terminated  or revoked sooner.       Influenza A by PCR NEGATIVE NEGATIVE Final   Influenza B by PCR NEGATIVE NEGATIVE Final    Comment: (NOTE) The Xpert Xpress SARS-CoV-2/FLU/RSV plus assay is intended as an aid in the diagnosis of influenza from Nasopharyngeal swab specimens and should not be used as a sole basis for treatment. Nasal washings and aspirates are unacceptable for Xpert Xpress SARS-CoV-2/FLU/RSV testing.  Fact Sheet for Patients: EntrepreneurPulse.com.au  Fact Sheet for Healthcare Providers: IncredibleEmployment.be  This test is not yet approved or cleared by the [place] FDA and has been authorized for detection and/or  diagnosis of SARS-CoV-2 by FDA under an Emergency Use Authorization (EUA). This EUA will remain in effect (meaning this test can be used) for the duration of the COVID-19 declaration under Section 564(b)(1) of the Act, 21 U.S.C. section 360bbb-3(b)(1), unless the authorization is terminated  or revoked.  Performed at [facility], [address], [place], [place] [address]    Studies/Results: US Abdomen Limited RUQ (LIVER/GB)  Result Date: 06/29/2020 CLINICAL DATA:  40 year old female with elevated LFTs. EXAM: ULTRASOUND ABDOMEN LIMITED RIGHT UPPER QUADRANT COMPARISON:  Ultrasound dated 04/10/2010 and CT dated 06/26/2020. FINDINGS: Gallbladder: Cholecystectomy. Common bile duct: Diameter: 2 mm Liver: The liver demonstrates a normal echogenicity. There is a 1.1 x 1.3 x 0.8 cm echogenic lesion in the right lobe of the liver which is not characterized but likely represents a hemangioma. Follow-up with ultrasound in 3 months recommended. Portal vein is patent on color Doppler imaging with normal direction of blood flow towards the liver. Other: Small subhepatic ascites. IMPRESSION: 1. Cholecystectomy. 2. Probable small hemangioma in the right lobe of the liver. 3. Small ascites. Electronically Signed   By: Anner Crete M.D.   On: 06/29/2020 18:17   Medications:  Scheduled Meds: . enoxaparin (LOVENOX) injection  40 mg Subcutaneous Q24H  . insulin aspart  0-5 Units Subcutaneous QHS  . insulin aspart  0-6 Units Subcutaneous TID WC  . insulin aspart  3 Units Subcutaneous TID WC  . insulin glargine  10 Units Subcutaneous QHS  . metoprolol tartrate  12.5 mg Oral BID  . pantoprazole (PROTONIX) IV  40 mg Intravenous Q12H   Continuous Infusions: . 0.9 % NaCl with KCl 40 mEq / L 50 mL/hr at 06/30/20 0403   PRN Meds:.dextrose, diphenhydrAMINE, hydrOXYzine, ondansetron (ZOFRAN) IV, oxyCODONE   Assessment: Active Problems:   DKA (diabetic ketoacidosis) (HCC)   Abnormal CT scan, esophagus   Acute esophagitis   Gastric erythema    Plan: Continue PPI twice daily for esophagitis Keep head of bed elevated to 30 degrees  Patient's transaminases are elevated from yesterday, and alk phos remains persistently elevated, although patient is denying any abdominal pain, fever or  chills  Ultrasound done yesterday shows evidence of cholecystectomy, and probable small hemangioma in the right lobe of the liver.  Common bile duct is normal.  No evidence of biliary obstruction.  Elevation in transaminases may be due to medications.  I discussed this with Dr. Kurtis Bushman.  She was receiving Lipitor as an inpatient, and unclear if she was taking this as an outpatient or not.  Since etiology is unclear, Lipitor has been held at this time  I have recommended acute hepatitis panel as well  I recommended the patient to avoid hepatotoxic drugs, including alcohol, or any heavy Tylenol use and she verbalized understanding  Would recommend repeating liver enzymes daily while patient is in the hospital, and within 1-2 weeks of discharge as well  Follow-up pending acute hepatitis panel  Dr. Marius Ditch to follow over the weekend   LOS: 4 days   Rachael Antigua, MD 06/30/2020, 1:23 PM

## 2020-06-30 NOTE — Discharge Summary (Signed)
Rachael Gray DOB: 1981/03/24 DOA: 06/26/2020  PCP[facility] [place] date: 06/26/2020 Discharge date: 06/30/2020  Admitted From: Home Disposition: Home  Recommendations for Outpatient Follow-up:  1. Follow up with PCP in 1 week 2. Please obtain BMP/CBC in one week 3. Follow with GI in 1 week     Discharge Condition:Stable CODE STATUS: Full Diet recommendation: Carb modified   Brief/Interim Summary: Per Rachael Gray is a 40 y.o. female with past medical history of diabetes,gastroparesis,anemia,Lupus,pseudoseizures,anxiety,.marijuana use, tobacco use,  recurrent DKA who presented to ed with complaint of shaking and vague complaint of note feeling well. On evaluation found to have mild DKA s/p which patient was started on dka protocol/insulin drip.Patient stated she had been ill for around two weeks with n/v/and abdominal pain with fever and chills. However she denies cough, diarrhea, dysuria , noncompliance with her insulin , she noted significant fatigue and malaise.CT abd/pelvis:Wall thickening of distal esophagus is noted concerning for possible esophagitis. Endoscopy is recommended for further evaluation.  DMI: w/ DKA which is now resolved. Continue home medication Needs to f/u with pcp for further management Discussed with patient discharge blood glucose levels controlled to improve long-term diabetes.  She verbalizes understanding.   Esophageal wall thickening: EGD 06/28/20 showed Grade C reflux esophagitis w/ no bleeding. Biopsies were obtained in gastric body, incisura & gastric antrum. She was on IV PPI with GI recommended to switch to p.o. twice daily PPI on discharge for 2 to 3 months  Hemoglobin has been stable  Needs to follow-up with GI in 1 week, Dr. Bonna Gains will make appointment for patient to follow-up Patient denies n/v. Tolerating her carb control diet and feels better.  Elevated alkaline phosphatase: etiology  unclear. RUQ Ultrasound done yesterday shows evidence of cholecystectomy, and probable small hemangioma in the right lobe of the liver.  Common bile duct is normal.  No evidence of biliary obstruction.  Elevated Transaminases- Pt on lipitor.also home dose, unsure if taking it. Dc lipitor Increased LFT was discussed with Dr. Bonna Gains, GI who recommended ordering acute hepatitis panel and she will follow up in 1 week with the patient.  She will make an appointment for patient to follow-up in 1 week as outpatient.  Blood draw done prior to discharge. Patient was counseled against drinking alcohol  Hypokalemia: Within normal limits today    Hypomagnesemia:  Supplemented with 2 g IV magnesium prior to discharge Follow-up with PCP for blood work as outpatient  Hx of nonepileptic psychogenic seizures: Was continued on Keppra as inpatient Although patient has not been taking Keppra since November 2021     Discharge Diagnoses:  Active Problems:   DKA (diabetic ketoacidosis) (HCC)   Abnormal CT scan, esophagus   Acute esophagitis   Gastric erythema    Discharge Instructions  Discharge Instructions    Call MD for:  severe uncontrolled pain   Complete by: As directed    Diet - low sodium heart healthy   Complete by: As directed    Discharge instructions   Complete by: As directed    Follow up with GI next week F/U with pcp in one week.   Increase activity slowly   Complete by: As directed      Allergies as of 06/30/2020      Reactions   Dilantin [phenytoin Sodium Extended] Hives   Doxycycline Swelling   Other    Morphine And Related Rash   Tylenol [acetaminophen] Rash      Medication  List    STOP taking these medications   aspirin 81 MG EC tablet   atorvastatin 40 MG tablet Commonly known as: LIPITOR   hydrocodone-ibuprofen 5-200 MG tablet Commonly known as: VICOPROFEN   levETIRAcetam 750 MG tablet Commonly known as: KEPPRA   methocarbamol 500 MG  tablet Commonly known as: ROBAXIN   Potassium Chloride ER 20 MEQ Tbcr     TAKE these medications   metoCLOPramide 10 MG tablet Commonly known as: REGLAN Take 1 tablet (10 mg total) by mouth every 8 (eight) hours as needed.   metoprolol tartrate 25 MG tablet Commonly known as: LOPRESSOR Take 0.5 tablets (12.5 mg total) by mouth 2 (two) times daily.   NovoLIN 70/30 (70-30) 100 UNIT/ML injection Generic drug: insulin NPH-regular Human Inject 10 units under the skin twice daily with breakfast and dinner   ondansetron 4 MG disintegrating tablet Commonly known as: Zofran ODT Take 1 tablet (4 mg total) by mouth every 8 (eight) hours as needed for nausea or vomiting.   oxyCODONE 5 MG immediate release tablet Commonly known as: Oxy IR/ROXICODONE Take 1 tablet (5 mg total) by mouth every 6 (six) hours as needed for up to 3 days for severe pain.   pantoprazole 40 MG tablet Commonly known as: Protonix Take 1 tablet (40 mg total) by mouth 2 (two) times daily.       Follow-up Information    Virgel Manifold, MD Follow up in 1 week(s).   Specialty: Gastroenterology Contact information: [place] [address] [address] [place], [place] Follow up in 1 week(s).   Specialty: General Practice Contact information: [place] [address] [phone]              Allergies  Allergen Reactions  . Dilantin [Phenytoin Sodium Extended] Hives  . Doxycycline Swelling  . Other   . Morphine And Related Rash  . Tylenol [Acetaminophen] Rash    Consultations:   GI  Procedures/Studies: CT Abdomen Pelvis W Contrast  Result Date: 06/26/2020 CLINICAL DATA:  Acute generalized abdominal pain. EXAM: CT ABDOMEN AND PELVIS WITH CONTRAST TECHNIQUE: Multidetector CT imaging of the abdomen and pelvis was performed using the standard protocol following bolus administration of intravenous contrast. CONTRAST:   22mL OMNIPAQUE IOHEXOL 300 MG/ML  SOLN COMPARISON:  January 23, 2020. FINDINGS: Lower chest: Visualized lung bases are unremarkable. Wall thickening of distal esophagus is noted concerning for possible esophagitis. Hepatobiliary: No focal liver abnormality is seen. Status post cholecystectomy. No biliary dilatation. Pancreas: Unremarkable. No pancreatic ductal dilatation or surrounding inflammatory changes. Spleen: Normal in size without focal abnormality. Adrenals/Urinary Tract: Adrenal glands are unremarkable. Kidneys are normal, without renal calculi, focal lesion, or hydronephrosis. Bladder is unremarkable. Stomach/Bowel: The stomach appears normal. There is no evidence of bowel obstruction or inflammation. Status post appendectomy. Vascular/Lymphatic: No significant vascular findings are present. No enlarged abdominal or pelvic lymph nodes. Reproductive: Uterus and bilateral adnexa are unremarkable. Other: No abdominal wall hernia or abnormality. No abdominopelvic ascites. Musculoskeletal: No acute or significant osseous findings. IMPRESSION: Wall thickening of distal esophagus is noted concerning for possible esophagitis. Endoscopy is recommended for further evaluation. No other abnormality seen in the abdomen or pelvis. Electronically Signed   By: Marijo Conception M.D.   On: 06/26/2020 12:23   Portable chest x-ray (1 view)  Result Date: 06/26/2020 CLINICAL DATA:  Shaking EXAM: PORTABLE CHEST 1 VIEW COMPARISON:  None. FINDINGS: The heart size  and mediastinal contours are within normal limits. Both lungs are clear. The visualized skeletal structures are unremarkable. IMPRESSION: No active disease. Electronically Signed   By: Prudencio Pair M.D.   On: 06/26/2020 15:41   US Abdomen Limited RUQ (LIVER/GB)  Result Date: 06/29/2020 CLINICAL DATA:  40 year old female with elevated LFTs. EXAM: ULTRASOUND ABDOMEN LIMITED RIGHT UPPER QUADRANT COMPARISON:  Ultrasound dated 04/10/2010 and CT dated 06/26/2020. FINDINGS:  Gallbladder: Cholecystectomy. Common bile duct: Diameter: 2 mm Liver: The liver demonstrates a normal echogenicity. There is a 1.1 x 1.3 x 0.8 cm echogenic lesion in the right lobe of the liver which is not characterized but likely represents a hemangioma. Follow-up with ultrasound in 3 months recommended. Portal vein is patent on color Doppler imaging with normal direction of blood flow towards the liver. Other: Small subhepatic ascites. IMPRESSION: 1. Cholecystectomy. 2. Probable small hemangioma in the right lobe of the liver. 3. Small ascites. Electronically Signed   By: Anner Crete M.D.   On: 06/29/2020 18:17       Subjective: Ate her food.  Feeling better.  No nausea or vomiting no abdominal pain.  Discharge Exam: Vitals:   06/30/20 0400 06/30/20 0835  BP: 117/70 132/76  Pulse: 77 87  Resp: 18 18  Temp: 98.8 F (37.1 C) 97.9 F (36.6 C)  SpO2: 100% 100%   Vitals:   06/29/20 1136 06/29/20 1926 06/30/20 0400 06/30/20 0835  BP: 133/85 (!) 123/91 117/70 132/76  Pulse: 89 (!) 106 77 87  Resp: 18 20 18 18   Temp: 97.8 F (36.6 C) 97.7 F (36.5 C) 98.8 F (37.1 C) 97.9 F (36.6 C)  TempSrc: Oral Oral Oral Oral  SpO2: 100% 100% 100% 100%  Weight:      Height:        General: Pt is alert, awake, not in acute distress Cardiovascular: RRR, S1/S2 +, no rubs, no gallops Respiratory: CTA bilaterally, no wheezing, no rhonchi Abdominal: Soft, NT, ND, bowel sounds + Extremities: no edema    The results of significant diagnostics from this hospitalization (including imaging, microbiology, ancillary and laboratory) are listed below for reference.     Microbiology: Recent Results (from the past 240 hour(s))  Resp Panel by RT-PCR (Flu A&B, Covid) Nasopharyngeal Swab     Status: None   Collection Time: 06/26/20  1:37 PM   Specimen: Nasopharyngeal Swab; Nasopharyngeal(NP) swabs in vial transport medium  Result Value Ref Range Status   SARS Coronavirus 2 by RT PCR NEGATIVE  NEGATIVE Final    Comment: (NOTE) SARS-CoV-2 target nucleic acids are NOT DETECTED.  The SARS-CoV-2 RNA is generally detectable in upper respiratory specimens during the acute phase of infection. The lowest concentration of SARS-CoV-2 viral copies this assay can detect is 138 copies/mL. A negative result does not preclude SARS-Cov-2 infection and should not be used as the sole basis for treatment or other patient management decisions. A negative result may occur with  improper specimen collection/handling, submission of specimen other than nasopharyngeal swab, presence of viral mutation(s) within the areas targeted by this assay, and inadequate number of viral copies(<138 copies/mL). A negative result must be combined with clinical observations, patient history, and epidemiological information. The expected result is Negative.  Fact Sheet for Patients:  EntrepreneurPulse.com.au  Fact Sheet for Healthcare Providers:  IncredibleEmployment.be  This test is no t yet approved or cleared by the [place] FDA and  has been authorized for detection and/or diagnosis of SARS-CoV-2 by FDA under an Emergency Use Authorization (EUA). This  EUA will remain  in effect (meaning this test can be used) for the duration of the COVID-19 declaration under Section 564(b)(1) of the Act, 21 U.S.C.section 360bbb-3(b)(1), unless the authorization is terminated  or revoked sooner.       Influenza A by PCR NEGATIVE NEGATIVE Final   Influenza B by PCR NEGATIVE NEGATIVE Final    Comment: (NOTE) The Xpert Xpress SARS-CoV-2/FLU/RSV plus assay is intended as an aid in the diagnosis of influenza from Nasopharyngeal swab specimens and should not be used as a sole basis for treatment. Nasal washings and aspirates are unacceptable for Xpert Xpress SARS-CoV-2/FLU/RSV testing.  Fact Sheet for Patients: EntrepreneurPulse.com.au  Fact Sheet for Healthcare  Providers: IncredibleEmployment.be  This test is not yet approved or cleared by the [place] FDA and has been authorized for detection and/or diagnosis of SARS-CoV-2 by FDA under an Emergency Use Authorization (EUA). This EUA will remain in effect (meaning this test can be used) for the duration of the COVID-19 declaration under Section 564(b)(1) of the Act, 21 U.S.C. section 360bbb-3(b)(1), unless the authorization is terminated or revoked.  Performed at [facility], [address] [place], [place] [address]      Labs: BNP (last 3 results) No results for input(s): BNP in the last 8760 hours. Basic Metabolic Panel: Recent Labs  Lab 06/26/20 2313 06/27/20 0204 06/28/20 0915 06/29/20 0608 06/30/20 0741  NA 134* 133* 136 136 136  K 3.3* 2.9* 3.2* 4.9 4.4  CL 94* 97* 104 106 103  CO2 26 27 26 25 24   GLUCOSE 66* 91 147* 211* 161*  BUN 19 20 15 14 11   CREATININE 0.88 0.63 0.63 0.66 0.62  CALCIUM 9.4 9.2 8.8* 8.5* 9.2  MG  --  2.1 1.6* 1.7 1.5*  PHOS  --  2.8 3.4  --   --    Liver Function Tests: Recent Labs  Lab 06/26/20 0908 06/27/20 1228 06/30/20 0741  AST 13* 20 53*  ALT 11 9 47*  ALKPHOS 294* 229* 253*  BILITOT 1.5* 0.7 0.4  PROT 9.1* 8.2* 7.1  ALBUMIN 3.6 3.3* 2.8*   Recent Labs  Lab 06/26/20 0908  LIPASE 30   No results for input(s): AMMONIA in the last 168 hours. CBC: Recent Labs  Lab 06/26/20 0729 06/28/20 0915 06/29/20 0608 06/30/20 0741  WBC 11.6* 5.5 5.8 7.1  NEUTROABS  --  3.2  --   --   HGB 12.5 9.7* 9.2* 10.3*  HCT 38.2 30.0* 29.5* 32.7*  MCV 80.8 82.6 84.0 83.0  PLT 378 281 258 275   Cardiac Enzymes: No results for input(s): CKTOTAL, CKMB, CKMBINDEX, TROPONINI in the last 168 hours. BNP: Invalid input(s): POCBNP CBG: Recent Labs  Lab 06/29/20 1115 06/29/20 1646 06/29/20 1700 06/29/20 2055 06/30/20 0833  GLUCAP 157* 92 87 206* 168*   D-Dimer No results for input(s): DDIMER in the last 72  hours. Hgb A1c No results for input(s): HGBA1C in the last 72 hours. Lipid Profile No results for input(s): CHOL, HDL, LDLCALC, TRIG, CHOLHDL, LDLDIRECT in the last 72 hours. Thyroid function studies No results for input(s): TSH, T4TOTAL, T3FREE, THYROIDAB in the last 72 hours.  Invalid input(s): FREET3 Anemia work up No results for input(s): VITAMINB12, FOLATE, FERRITIN, TIBC, IRON, RETICCTPCT in the last 72 hours. Urinalysis    Component Value Date/Time   COLORURINE STRAW (A) 06/26/2020 0727   APPEARANCEUR CLEAR (A) 06/26/2020 0727   APPEARANCEUR HAZY 06/15/2014 2100   LABSPEC 1.022 06/26/2020 0727   LABSPEC 1.018  06/15/2014 2100   PHURINE 5.0 06/26/2020 0727   GLUCOSEU >=500 (A) 06/26/2020 0727   GLUCOSEU >=500 mg/dL 06/15/2014 2100   HGBUR MODERATE (A) 06/26/2020 0727   BILIRUBINUR NEGATIVE 06/26/2020 0727   BILIRUBINUR NEGATIVE 06/15/2014 2100   KETONESUR 80 (A) 06/26/2020 0727   PROTEINUR 100 (A) 06/26/2020 0727   UROBILINOGEN 1.0 02/09/2008 1519   NITRITE NEGATIVE 06/26/2020 0727   LEUKOCYTESUR NEGATIVE 06/26/2020 0727   LEUKOCYTESUR TRACE 06/15/2014 2100   Sepsis Labs Invalid input(s): PROCALCITONIN,  WBC,  LACTICIDVEN Microbiology Recent Results (from the past 240 hour(s))  Resp Panel by RT-PCR (Flu A&B, Covid) Nasopharyngeal Swab     Status: None   Collection Time: 06/26/20  1:37 PM   Specimen: Nasopharyngeal Swab; Nasopharyngeal(NP) swabs in vial transport medium  Result Value Ref Range Status   SARS Coronavirus 2 by RT PCR NEGATIVE NEGATIVE Final    Comment: (NOTE) SARS-CoV-2 target nucleic acids are NOT DETECTED.  The SARS-CoV-2 RNA is generally detectable in upper respiratory specimens during the acute phase of infection. The lowest concentration of SARS-CoV-2 viral copies this assay can detect is 138 copies/mL. A negative result does not preclude SARS-Cov-2 infection and should not be used as the sole basis for treatment or other patient management  decisions. A negative result may occur with  improper specimen collection/handling, submission of specimen other than nasopharyngeal swab, presence of viral mutation(s) within the areas targeted by this assay, and inadequate number of viral copies(<138 copies/mL). A negative result must be combined with clinical observations, patient history, and epidemiological information. The expected result is Negative.  Fact Sheet for Patients:  EntrepreneurPulse.com.au  Fact Sheet for Healthcare Providers:  IncredibleEmployment.be  This test is no t yet approved or cleared by the [place] FDA and  has been authorized for detection and/or diagnosis of SARS-CoV-2 by FDA under an Emergency Use Authorization (EUA). This EUA will remain  in effect (meaning this test can be used) for the duration of the COVID-19 declaration under Section 564(b)(1) of the Act, 21 U.S.C.section 360bbb-3(b)(1), unless the authorization is terminated  or revoked sooner.       Influenza A by PCR NEGATIVE NEGATIVE Final   Influenza B by PCR NEGATIVE NEGATIVE Final    Comment: (NOTE) The Xpert Xpress SARS-CoV-2/FLU/RSV plus assay is intended as an aid in the diagnosis of influenza from Nasopharyngeal swab specimens and should not be used as a sole basis for treatment. Nasal washings and aspirates are unacceptable for Xpert Xpress SARS-CoV-2/FLU/RSV testing.  Fact Sheet for Patients: EntrepreneurPulse.com.au  Fact Sheet for Healthcare Providers: IncredibleEmployment.be  This test is not yet approved or cleared by the [place] FDA and has been authorized for detection and/or diagnosis of SARS-CoV-2 by FDA under an Emergency Use Authorization (EUA). This EUA will remain in effect (meaning this test can be used) for the duration of the COVID-19 declaration under Section 564(b)(1) of the Act, 21 U.S.C. section 360bbb-3(b)(1), unless the  authorization is terminated or revoked.  Performed at [facility], [address] [place], [place] [address]      Time coordinating discharge: Over 30 minutes  SIGNED:   Nolberto Hanlon, MD  [facility] 06/30/2020, 10:53 AM Pager   If 7PM-7AM, please contact night-coverage [email or web address] Password [email or web address]

## 2020-07-03 ENCOUNTER — Telehealth: Payer: Self-pay | Admitting: Gastroenterology

## 2020-07-03 NOTE — Telephone Encounter (Signed)
Called to schedule patient for f/u per Dr T, unable to reach due to phone number being incorrect.

## 2020-07-03 NOTE — Telephone Encounter (Signed)
Virgel Manifold, MD  Dionisio David, Haislee Corso L Please set up clinic appointment with me next week for hospital follow up     Called patient several times. No answer no voicemail.

## 2020-08-24 ENCOUNTER — Other Ambulatory Visit: Payer: Self-pay

## 2020-08-24 ENCOUNTER — Emergency Department
Admission: EM | Admit: 2020-08-24 | Discharge: 2020-08-25 | Disposition: A | Discharge status: Home / Self Care | Payer: Medicaid Other | Attending: Emergency Medicine | Admitting: Emergency Medicine

## 2020-08-24 ENCOUNTER — Encounter: Payer: Self-pay | Admitting: *Deleted

## 2020-08-24 DIAGNOSIS — E1110 Type 2 diabetes mellitus with ketoacidosis without coma: Secondary | ICD-10-CM | POA: Insufficient documentation

## 2020-08-24 DIAGNOSIS — J069 Acute upper respiratory infection, unspecified: Secondary | ICD-10-CM | POA: Diagnosis not present

## 2020-08-24 DIAGNOSIS — R109 Unspecified abdominal pain: Secondary | ICD-10-CM | POA: Diagnosis not present

## 2020-08-24 DIAGNOSIS — B9789 Other viral agents as the cause of diseases classified elsewhere: Secondary | ICD-10-CM | POA: Diagnosis not present

## 2020-08-24 DIAGNOSIS — E1143 Type 2 diabetes mellitus with diabetic autonomic (poly)neuropathy: Secondary | ICD-10-CM | POA: Diagnosis not present

## 2020-08-24 DIAGNOSIS — Z794 Long term (current) use of insulin: Secondary | ICD-10-CM | POA: Insufficient documentation

## 2020-08-24 DIAGNOSIS — F17210 Nicotine dependence, cigarettes, uncomplicated: Secondary | ICD-10-CM | POA: Insufficient documentation

## 2020-08-24 DIAGNOSIS — R1110 Vomiting, unspecified: Secondary | ICD-10-CM | POA: Diagnosis present

## 2020-08-24 DIAGNOSIS — Z20822 Contact with and (suspected) exposure to covid-19: Secondary | ICD-10-CM | POA: Diagnosis not present

## 2020-08-24 LAB — COMPREHENSIVE METABOLIC PANEL
ALT: 9 U/L (ref 0–44)
AST: 19 U/L (ref 15–41)
Albumin: 3.9 g/dL (ref 3.5–5.0)
Alkaline Phosphatase: 75 U/L (ref 38–126)
Anion gap: 13 (ref 5–15)
BUN: 22 mg/dL — ABNORMAL HIGH (ref 6–20)
CO2: 23 mmol/L (ref 22–32)
Calcium: 9.6 mg/dL (ref 8.9–10.3)
Chloride: 98 mmol/L (ref 98–111)
Creatinine, Ser: 0.63 mg/dL (ref 0.44–1.00)
GFR, Estimated: >60 mL/min (ref >60)
Glucose, Bld: 307 mg/dL — ABNORMAL HIGH (ref 70–99)
Potassium: 3.7 mmol/L (ref 3.5–5.1)
Sodium: 134 mmol/L — ABNORMAL LOW (ref 135–145)
Total Bilirubin: 1.1 mg/dL (ref 0.3–1.2)
Total Protein: 9 g/dL — ABNORMAL HIGH (ref 6.5–8.1)

## 2020-08-24 LAB — CBC WITH DIFFERENTIAL/PLATELET
Abs Immature Granulocytes: 0.01 10*3/uL (ref 0.00–0.07)
Basophils Absolute: 0 10*3/uL (ref 0.0–0.1)
Basophils Relative: 0 %
Eosinophils Absolute: 0 10*3/uL (ref 0.0–0.5)
Eosinophils Relative: 0 %
HCT: 35 % — ABNORMAL LOW (ref 36.0–46.0)
Hemoglobin: 11.4 g/dL — ABNORMAL LOW (ref 12.0–15.0)
Immature Granulocytes: 0 %
Lymphocytes Relative: 39 %
Lymphs Abs: 2.8 10*3/uL (ref 0.7–4.0)
MCH: 26 pg (ref 26.0–34.0)
MCHC: 32.6 g/dL (ref 30.0–36.0)
MCV: 79.7 fL — ABNORMAL LOW (ref 80.0–100.0)
Monocytes Absolute: 0.5 10*3/uL (ref 0.1–1.0)
Monocytes Relative: 6 %
Neutro Abs: 4 10*3/uL (ref 1.7–7.7)
Neutrophils Relative %: 55 %
Platelets: 298 10*3/uL (ref 150–400)
RBC: 4.39 MIL/uL (ref 3.87–5.11)
RDW: 15.1 % (ref 11.5–15.5)
Smear Review: UNDETERMINED
WBC: 7.3 10*3/uL (ref 4.0–10.5)
nRBC: 0 % (ref 0.0–0.2)

## 2020-08-24 LAB — URINALYSIS, COMPLETE (UACMP) WITH MICROSCOPIC
Bacteria, UA: NONE SEEN
Bilirubin Urine: NEGATIVE
Glucose, UA: >=500 mg/dL — AB
Ketones, ur: 5 mg/dL — AB
Nitrite: NEGATIVE
Protein, ur: >=300 mg/dL — AB
Specific Gravity, Urine: 1.028 (ref 1.005–1.030)
pH: 5 (ref 5.0–8.0)

## 2020-08-24 LAB — LIPASE, BLOOD: Lipase: 27 U/L (ref 11–51)

## 2020-08-24 LAB — HCG, QUANTITATIVE, PREGNANCY: hCG, Beta Chain, Quant, S: <1 m[IU]/mL (ref <5)

## 2020-08-24 MED ORDER — ONDANSETRON 4 MG PO TBDP: 4.0000 mg | ORAL_TABLET | Freq: Three times a day (TID) | ORAL | 0 refills | Status: AC | PRN

## 2020-08-24 NOTE — Discharge Instructions (Addendum)
Take Tylenol for fever. You have been prescribed Zofran for nausea.

## 2020-08-24 NOTE — ED Provider Notes (Signed)
[facility]-EMERGENCY DEPARTMENT  ____________________________________________  Time seen: Approximately 11:11 PM  I have reviewed the triage vital signs and the nursing notes.   HISTORY  Chief Complaint Covid Exposure   Historian Patient    HPI Rachael Gray is a 40 y.o. female with a history of diabetes, lupus and seizures, presents to the emergency department with abdominal discomfort, vomiting, body aches, chills and pharyngitis.  Patient reports that she has recently been exposed to her brother-in-law who tested positive for COVID-19.  When questioned about recent glucose levels, patient reports that she has not been checking or taking her insulin "for some time".  Patient states that her current symptoms do not feel like when she is in DKA because her hands normally feel more clammy.  She denies chest pain, chest tightness or shortness of breath.  She has had sporadic cough.   Past Medical History:  Diagnosis Date  . Collagen vascular disease ([place])   . Diabetes mellitus without complication ([place])   . Gastroparesis   . Lupus ([place])   . Pseudoseizures ([place])   . Seizures ([place])      Immunizations up to date:  Yes.     Past Medical History:  Diagnosis Date  . Collagen vascular disease ([place])   . Diabetes mellitus without complication ([place])   . Gastroparesis   . Lupus ([place])   . Pseudoseizures ([place])   . Seizures [facility])     Patient Active Problem List   Diagnosis Date Noted  . Abnormal CT scan, esophagus   . Acute esophagitis   . Gastric erythema   . DKA (diabetic ketoacidosis) ([place]) 01/25/2020  . Tobacco abuse disorder 01/25/2020  . SIRS due to non-infectious process without acute organ dysfunction ([place]) 01/25/2020  . DKA, type 2 ([place]) 01/25/2020  . Cannabis use disorder, moderate, dependence ([place]) 12/01/2016  . 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 Hypokalemia 12/24/2015  . Anemia 12/24/2015  . Diabetes ([place]) 12/24/2015  . Gastroparesis due to DM  ([place])   . Acute respiratory alkalosis 12/22/2015  . Duodenitis 12/22/2015  . Vomiting 12/16/2015  . Nausea & vomiting 12/10/2014  . Narcotic abuse ([place]) 12/09/2014  . Sepsis ([place]) 12/07/2014  . Anxiety   . Pyelonephritis 12/04/2014  . Lactic acidosis 12/04/2014    Class: Acute    Past Surgical History:  Procedure Laterality Date  . APPENDECTOMY    . CHOLECYSTECTOMY    . ESOPHAGOGASTRODUODENOSCOPY N/A 06/28/2020   Procedure: ESOPHAGOGASTRODUODENOSCOPY (EGD);  Surgeon: Virgel Manifold, MD;  Location: [facility] ENDOSCOPY;  Service: Endoscopy;  Laterality: N/A;    Prior to Admission medications   Medication Sig Start Date End Date Taking? Authorizing Provider  benzonatate (TESSALON PERLES) 100 MG capsule Take 1 capsule (100 mg total) by mouth 3 (three) times daily as needed for up to 7 days for cough. 08/25/20 09/01/20 Yes Vallarie Mare M, PA-C  ondansetron (ZOFRAN ODT) 4 MG disintegrating tablet Take 1 tablet (4 mg total) by mouth every 8 (eight) hours as needed for up to 5 days. 08/24/20 08/29/20 Yes Vallarie Mare M, PA-C  insulin NPH-regular Human (NOVOLIN 70/30) (70-30) 100 UNIT/ML injection Inject 10 units under the skin twice daily with breakfast and dinner 01/28/20   Nicole Kindred A, DO  metoCLOPramide (REGLAN) 10 MG tablet Take 1 tablet (10 mg total) by mouth every 8 (eight) hours as needed. Patient not taking: No sig reported 02/25/17   Loney Hering, MD  metoprolol tartrate (LOPRESSOR) 25 MG tablet Take 0.5 tablets (12.5 mg total) by  mouth 2 (two) times daily. 06/30/20 07/30/20  Nolberto Hanlon, MD  pantoprazole (PROTONIX) 40 MG tablet Take 1 tablet (40 mg total) by mouth 2 (two) times daily. 06/30/20 07/30/20  Nolberto Hanlon, MD    Allergies Dilantin [phenytoin sodium extended], Doxycycline, Other, Morphine and related, and Tylenol [acetaminophen]  Family History  Problem Relation Age of Onset  . Breast cancer Maternal Grandmother     Social History Social History   Tobacco Use  .  Smoking status: Current Every Day Smoker    Packs/day: 0.50    Years: 10.00    Pack years: 5.00    Types: Cigarettes  . Smokeless tobacco: Never Used  Vaping Use  . Vaping Use: Never used  Substance Use Topics  . Alcohol use: No  . Drug use: Yes    Types: Marijuana    Comment: yesterday     Review of Systems  Constitutional: Patient has chills.  Eyes:  No discharge ENT: No upper respiratory complaints. Respiratory: Patient has sporadic cough. No SOB/ use of accessory muscles to breath Gastrointestinal: Patient has abdominal discomfort.  Musculoskeletal: Negative for musculoskeletal pain. Skin: Negative for rash, abrasions, lacerations, ecchymosis.    ____________________________________________   PHYSICAL EXAM:  VITAL SIGNS: ED Triage Vitals  Enc Vitals Group     BP 08/24/20 2126 124/89     Pulse Rate 08/24/20 2126 (!) 123     Resp 08/24/20 2126 20     Temp 08/24/20 2126 98.7 F (37.1 C)     Temp Source 08/24/20 2126 Oral     SpO2 08/24/20 2126 99 %     Weight 08/24/20 2127 148 lb (67.1 kg)     Height 08/24/20 2127 5\' 5"  (1.651 m)     Head Circumference --      Peak Flow --      Pain Score 08/24/20 2126 7     Pain Loc --      Pain Edu? --      Excl. in [place]? --      Constitutional: Alert and oriented. Patient is lying supine. Eyes: Conjunctivae are normal. PERRL. EOMI. Head: Atraumatic. ENT:      Ears: Tympanic membranes are mildly injected with mild effusion bilaterally.       Nose: No congestion/rhinnorhea.      Mouth/Throat: Mucous membranes are moist. Posterior pharynx is mildly erythematous.  Hematological/Lymphatic/Immunilogical: No cervical lymphadenopathy.  Cardiovascular: Normal rate, regular rhythm. Normal S1 and S2.  Good peripheral circulation. Respiratory: Normal respiratory effort without tachypnea or retractions. Lungs CTAB. Good air entry to the bases with no decreased or absent breath sounds. Gastrointestinal: Bowel sounds 4 quadrants.  Soft and nontender to palpation. No guarding or rigidity. No palpable masses. No distention. No CVA tenderness. Musculoskeletal: Full range of motion to all extremities. No gross deformities appreciated. Neurologic:  Normal speech and language. No gross focal neurologic deficits are appreciated.  Skin:  Skin is warm, dry and intact. No rash noted. Psychiatric: Mood and affect are normal. Speech and behavior are normal. Patient exhibits appropriate insight and judgement.    ____________________________________________   LABS (all labs ordered are listed, but only abnormal results are displayed)  Labs Reviewed  CBC WITH DIFFERENTIAL/PLATELET - Abnormal; Notable for the following components:      Result Value   Hemoglobin 11.4 (*)    HCT 35.0 (*)    MCV 79.7 (*)    All other components within normal limits  COMPREHENSIVE METABOLIC PANEL - Abnormal; Notable for the following components:  Sodium 134 (*)    Glucose, Bld 307 (*)    BUN 22 (*)    Total Protein 9.0 (*)    All other components within normal limits  URINALYSIS, COMPLETE (UACMP) WITH MICROSCOPIC - Abnormal; Notable for the following components:   Color, Urine AMBER (*)    APPearance CLOUDY (*)    Glucose, UA >=500 (*)    Hgb urine dipstick SMALL (*)    Ketones, ur 5 (*)    Protein, ur >=300 (*)    Leukocytes,Ua SMALL (*)    All other components within normal limits  BETA-HYDROXYBUTYRIC ACID - Abnormal; Notable for the following components:   Beta-Hydroxybutyric Acid 0.79 (*)    All other components within normal limits  RESP PANEL BY RT-PCR (FLU A&B, COVID) ARPGX2  LIPASE, BLOOD  HCG, QUANTITATIVE, PREGNANCY   ____________________________________________  EKG   ____________________________________________  RADIOLOGY   No results found.  ____________________________________________    PROCEDURES  Procedure(s) performed:     Procedures     Medications - No data to  display   ____________________________________________   INITIAL IMPRESSION / ASSESSMENT AND PLAN / ED COURSE  Pertinent labs & imaging results that were available during my care of the patient were reviewed by me and considered in my medical decision making (see chart for details).      Assessment and plan:  Viral URI 40 year old female presents to the emergency department with chills, abdominal discomfort, cough, nasal congestion and body aches that started today after being around her brother in law who tested positive for COVID-19.  Patient was tachycardic at triage but vital signs were otherwise reassuring.  Patient was alert, oriented nontoxic-appearing.  CMP indicated normal anion gap.  No leukocytosis on CBC.  Lipase was within reference range.  Patient's blood glucose level was 307.  Patient reported that she had insulin at home and reminded the patient about the importance of compliance with her insulin to avoid going into DKA.  Patient was discharged with Zofran for nausea and Tessalon Perles for cough.  Recommended rest and hydration at home. Return precautions were given to return with new or worsening symptoms.     ____________________________________________  FINAL CLINICAL IMPRESSION(S) / ED DIAGNOSES  Final diagnoses:  Viral URI      NEW MEDICATIONS STARTED DURING THIS VISIT:  ED Discharge Orders         Ordered    benzonatate (TESSALON PERLES) 100 MG capsule  3 times daily PRN        08/25/20 0002    ondansetron (ZOFRAN ODT) 4 MG disintegrating tablet  Every 8 hours PRN        08/24/20 2359              This chart was dictated using voice recognition software/Dragon. Despite best efforts to proofread, errors can occur which can change the meaning. Any change was purely unintentional.     Lannie Fields, PA-C 08/25/20 Ardine Bjork, MD 08/25/20 [phone]

## 2020-08-24 NOTE — ED Triage Notes (Signed)
Pt reports ears stopped up, sore throat.  States her brother in law has covid.  Pt alert  Speech clear. Sx for 3 days.

## 2020-08-25 LAB — RESP PANEL BY RT-PCR (FLU A&B, COVID) ARPGX2
Influenza A by PCR: NEGATIVE
Influenza B by PCR: NEGATIVE
SARS Coronavirus 2 by RT PCR: NEGATIVE

## 2020-08-25 LAB — BETA-HYDROXYBUTYRIC ACID: Beta-Hydroxybutyric Acid: 0.79 mmol/L — ABNORMAL HIGH (ref 0.05–0.27)

## 2020-08-25 MED ORDER — BENZONATATE 100 MG PO CAPS: 100.0000 mg | ORAL_CAPSULE | Freq: Three times a day (TID) | ORAL | 0 refills | Status: AC | PRN

## 2020-08-26 ENCOUNTER — Observation Stay: Payer: Medicaid Other

## 2020-08-26 ENCOUNTER — Encounter: Payer: Self-pay | Admitting: Radiology

## 2020-08-26 ENCOUNTER — Other Ambulatory Visit: Payer: Self-pay

## 2020-08-26 ENCOUNTER — Emergency Department: Payer: Medicaid Other

## 2020-08-26 ENCOUNTER — Inpatient Hospital Stay
Admission: EM | Admit: 2020-08-26 | Discharge: 2020-08-27 | DRG: 639 | Disposition: A | Discharge status: Home / Self Care | Payer: Medicaid Other | Attending: Internal Medicine | Admitting: Internal Medicine

## 2020-08-26 DIAGNOSIS — R680 Hypothermia, not associated with low environmental temperature: Secondary | ICD-10-CM | POA: Diagnosis not present

## 2020-08-26 DIAGNOSIS — Z885 Allergy status to narcotic agent status: Secondary | ICD-10-CM

## 2020-08-26 DIAGNOSIS — Z888 Allergy status to other drugs, medicaments and biological substances status: Secondary | ICD-10-CM

## 2020-08-26 DIAGNOSIS — F445 Conversion disorder with seizures or convulsions: Secondary | ICD-10-CM

## 2020-08-26 DIAGNOSIS — E1143 Type 2 diabetes mellitus with diabetic autonomic (poly)neuropathy: Secondary | ICD-10-CM | POA: Diagnosis present

## 2020-08-26 DIAGNOSIS — K3184 Gastroparesis: Secondary | ICD-10-CM | POA: Diagnosis present

## 2020-08-26 DIAGNOSIS — E876 Hypokalemia: Secondary | ICD-10-CM | POA: Diagnosis not present

## 2020-08-26 DIAGNOSIS — R112 Nausea with vomiting, unspecified: Secondary | ICD-10-CM | POA: Diagnosis present

## 2020-08-26 DIAGNOSIS — R109 Unspecified abdominal pain: Secondary | ICD-10-CM | POA: Diagnosis present

## 2020-08-26 DIAGNOSIS — E161 Other hypoglycemia: Secondary | ICD-10-CM | POA: Diagnosis not present

## 2020-08-26 DIAGNOSIS — Z20822 Contact with and (suspected) exposure to covid-19: Secondary | ICD-10-CM | POA: Diagnosis present

## 2020-08-26 DIAGNOSIS — K219 Gastro-esophageal reflux disease without esophagitis: Secondary | ICD-10-CM | POA: Diagnosis present

## 2020-08-26 DIAGNOSIS — E162 Hypoglycemia, unspecified: Secondary | ICD-10-CM | POA: Diagnosis not present

## 2020-08-26 DIAGNOSIS — E11649 Type 2 diabetes mellitus with hypoglycemia without coma: Principal | ICD-10-CM | POA: Diagnosis present

## 2020-08-26 DIAGNOSIS — R569 Unspecified convulsions: Secondary | ICD-10-CM | POA: Diagnosis not present

## 2020-08-26 DIAGNOSIS — D3500 Benign neoplasm of unspecified adrenal gland: Secondary | ICD-10-CM | POA: Diagnosis not present

## 2020-08-26 DIAGNOSIS — T68XXXA Hypothermia, initial encounter: Secondary | ICD-10-CM | POA: Diagnosis present

## 2020-08-26 DIAGNOSIS — F17210 Nicotine dependence, cigarettes, uncomplicated: Secondary | ICD-10-CM | POA: Diagnosis present

## 2020-08-26 DIAGNOSIS — Z79899 Other long term (current) drug therapy: Secondary | ICD-10-CM

## 2020-08-26 DIAGNOSIS — M329 Systemic lupus erythematosus, unspecified: Secondary | ICD-10-CM | POA: Diagnosis present

## 2020-08-26 DIAGNOSIS — Z720 Tobacco use: Secondary | ICD-10-CM | POA: Diagnosis present

## 2020-08-26 DIAGNOSIS — E1165 Type 2 diabetes mellitus with hyperglycemia: Secondary | ICD-10-CM | POA: Diagnosis not present

## 2020-08-26 DIAGNOSIS — Z794 Long term (current) use of insulin: Secondary | ICD-10-CM

## 2020-08-26 DIAGNOSIS — E119 Type 2 diabetes mellitus without complications: Secondary | ICD-10-CM | POA: Diagnosis not present

## 2020-08-26 DIAGNOSIS — R61 Generalized hyperhidrosis: Secondary | ICD-10-CM | POA: Diagnosis not present

## 2020-08-26 DIAGNOSIS — N2889 Other specified disorders of kidney and ureter: Secondary | ICD-10-CM | POA: Diagnosis not present

## 2020-08-26 DIAGNOSIS — Z881 Allergy status to other antibiotic agents status: Secondary | ICD-10-CM

## 2020-08-26 DIAGNOSIS — R739 Hyperglycemia, unspecified: Secondary | ICD-10-CM | POA: Diagnosis not present

## 2020-08-26 DIAGNOSIS — IMO0002 Reserved for concepts with insufficient information to code with codable children: Secondary | ICD-10-CM | POA: Diagnosis present

## 2020-08-26 LAB — BASIC METABOLIC PANEL
Anion gap: 10 (ref 5–15)
Anion gap: 9 (ref 5–15)
BUN: 22 mg/dL — ABNORMAL HIGH (ref 6–20)
BUN: 15 mg/dL (ref 6–20)
CO2: 28 mmol/L (ref 22–32)
CO2: 20 mmol/L — ABNORMAL LOW (ref 22–32)
Calcium: 9.3 mg/dL (ref 8.9–10.3)
Calcium: 8.6 mg/dL — ABNORMAL LOW (ref 8.9–10.3)
Chloride: 95 mmol/L — ABNORMAL LOW (ref 98–111)
Chloride: 102 mmol/L (ref 98–111)
Creatinine, Ser: 0.54 mg/dL (ref 0.44–1.00)
Creatinine, Ser: 0.76 mg/dL (ref 0.44–1.00)
GFR, Estimated: >60 mL/min (ref >60)
GFR, Estimated: >60 mL/min (ref >60)
Glucose, Bld: 325 mg/dL — ABNORMAL HIGH (ref 70–99)
Glucose, Bld: 279 mg/dL — ABNORMAL HIGH (ref 70–99)
Potassium: 2.9 mmol/L — ABNORMAL LOW (ref 3.5–5.1)
Potassium: 3.9 mmol/L (ref 3.5–5.1)
Sodium: 133 mmol/L — ABNORMAL LOW (ref 135–145)
Sodium: 131 mmol/L — ABNORMAL LOW (ref 135–145)

## 2020-08-26 LAB — URINALYSIS, COMPLETE (UACMP) WITH MICROSCOPIC
Bilirubin Urine: NEGATIVE
Glucose, UA: >=500 mg/dL — AB
Ketones, ur: NEGATIVE mg/dL
Leukocytes,Ua: NEGATIVE
Nitrite: NEGATIVE
Protein, ur: 30 mg/dL — AB
Specific Gravity, Urine: 1.01 (ref 1.005–1.030)
pH: 6 (ref 5.0–8.0)

## 2020-08-26 LAB — HEPATIC FUNCTION PANEL
ALT: 5 U/L (ref 0–44)
AST: 12 U/L — ABNORMAL LOW (ref 15–41)
Albumin: 3.4 g/dL — ABNORMAL LOW (ref 3.5–5.0)
Alkaline Phosphatase: 66 U/L (ref 38–126)
Bilirubin, Direct: <0.1 mg/dL (ref 0.0–0.2)
Total Bilirubin: 0.4 mg/dL (ref 0.3–1.2)
Total Protein: 8.1 g/dL (ref 6.5–8.1)

## 2020-08-26 LAB — CBC WITH DIFFERENTIAL/PLATELET
Abs Immature Granulocytes: 0.01 10*3/uL (ref 0.00–0.07)
Basophils Absolute: 0 10*3/uL (ref 0.0–0.1)
Basophils Relative: 0 %
Eosinophils Absolute: 0 10*3/uL (ref 0.0–0.5)
Eosinophils Relative: 0 %
HCT: 33.8 % — ABNORMAL LOW (ref 36.0–46.0)
Hemoglobin: 11 g/dL — ABNORMAL LOW (ref 12.0–15.0)
Immature Granulocytes: 0 %
Lymphocytes Relative: 23 %
Lymphs Abs: 1.6 10*3/uL (ref 0.7–4.0)
MCH: 25.9 pg — ABNORMAL LOW (ref 26.0–34.0)
MCHC: 32.5 g/dL (ref 30.0–36.0)
MCV: 79.7 fL — ABNORMAL LOW (ref 80.0–100.0)
Monocytes Absolute: 0.4 10*3/uL (ref 0.1–1.0)
Monocytes Relative: 5 %
Neutro Abs: 5.2 10*3/uL (ref 1.7–7.7)
Neutrophils Relative %: 72 %
Platelets: 369 10*3/uL (ref 150–400)
RBC: 4.24 MIL/uL (ref 3.87–5.11)
RDW: 15 % (ref 11.5–15.5)
WBC: 7.3 10*3/uL (ref 4.0–10.5)
nRBC: 0 % (ref 0.0–0.2)

## 2020-08-26 LAB — RESP PANEL BY RT-PCR (FLU A&B, COVID) ARPGX2
Influenza A by PCR: NEGATIVE
Influenza B by PCR: NEGATIVE
SARS Coronavirus 2 by RT PCR: NEGATIVE

## 2020-08-26 LAB — GLUCOSE, CAPILLARY
Glucose-Capillary: 127 mg/dL — ABNORMAL HIGH (ref 70–99)
Glucose-Capillary: 184 mg/dL — ABNORMAL HIGH (ref 70–99)
Glucose-Capillary: 305 mg/dL — ABNORMAL HIGH (ref 70–99)
Glucose-Capillary: 264 mg/dL — ABNORMAL HIGH (ref 70–99)
Glucose-Capillary: 211 mg/dL — ABNORMAL HIGH (ref 70–99)

## 2020-08-26 LAB — URINE DRUG SCREEN, QUALITATIVE (ARMC ONLY)
Amphetamines, Ur Screen: NOT DETECTED
Barbiturates, Ur Screen: NOT DETECTED
Benzodiazepine, Ur Scrn: NOT DETECTED
Cannabinoid 50 Ng, Ur ~~LOC~~: POSITIVE — AB
Cocaine Metabolite,Ur ~~LOC~~: NOT DETECTED
MDMA (Ecstasy)Ur Screen: NOT DETECTED
Methadone Scn, Ur: NOT DETECTED
Opiate, Ur Screen: NOT DETECTED
Phencyclidine (PCP) Ur S: NOT DETECTED
Tricyclic, Ur Screen: NOT DETECTED

## 2020-08-26 LAB — CBG MONITORING, ED
Glucose-Capillary: 186 mg/dL — ABNORMAL HIGH (ref 70–99)
Glucose-Capillary: 34 mg/dL — CL (ref 70–99)
Glucose-Capillary: 116 mg/dL — ABNORMAL HIGH (ref 70–99)
Glucose-Capillary: 67 mg/dL — ABNORMAL LOW (ref 70–99)
Glucose-Capillary: 122 mg/dL — ABNORMAL HIGH (ref 70–99)

## 2020-08-26 LAB — MRSA PCR SCREENING: MRSA by PCR: POSITIVE — AB

## 2020-08-26 LAB — LACTIC ACID, PLASMA: Lactic Acid, Venous: 2 mmol/L (ref 0.5–1.9)

## 2020-08-26 LAB — MAGNESIUM: Magnesium: 1.7 mg/dL (ref 1.7–2.4)

## 2020-08-26 LAB — LIPASE, BLOOD: Lipase: 44 U/L (ref 11–51)

## 2020-08-26 LAB — PROCALCITONIN: Procalcitonin: 0.16 ng/mL

## 2020-08-26 LAB — ETHANOL: Alcohol, Ethyl (B): <10 mg/dL (ref <10)

## 2020-08-26 MED ORDER — KETOROLAC TROMETHAMINE 30 MG/ML IJ SOLN
INTRAMUSCULAR | Status: AC
  Filled 2020-08-26: qty 1

## 2020-08-26 MED ORDER — HYDROCORTISONE NA SUCCINATE PF 100 MG IJ SOLR
50.0000 mg | Freq: Once | INTRAMUSCULAR | Status: AC
  Administered 2020-08-26: 50 mg via INTRAVENOUS
  Filled 2020-08-26: qty 2

## 2020-08-26 MED ORDER — CHLORHEXIDINE GLUCONATE CLOTH 2 % EX PADS
6.0000 | MEDICATED_PAD | Freq: Every day | CUTANEOUS | Status: DC
  Administered 2020-08-26: 6 via TOPICAL

## 2020-08-26 MED ORDER — PANTOPRAZOLE SODIUM 40 MG IV SOLR
40.0000 mg | Freq: Two times a day (BID) | INTRAVENOUS | Status: DC
  Administered 2020-08-26 – 2020-08-27 (×3): 40 mg via INTRAVENOUS
  Filled 2020-08-26 (×3): qty 40

## 2020-08-26 MED ORDER — MUPIROCIN 2 % EX OINT
1.0000 "application " | TOPICAL_OINTMENT | Freq: Two times a day (BID) | CUTANEOUS | Status: DC
  Filled 2020-08-26: qty 22

## 2020-08-26 MED ORDER — ENOXAPARIN SODIUM 40 MG/0.4ML IJ SOSY
40.0000 mg | PREFILLED_SYRINGE | INTRAMUSCULAR | Status: DC
  Administered 2020-08-26 – 2020-08-27 (×2): 40 mg via SUBCUTANEOUS
  Filled 2020-08-26 (×2): qty 0.4

## 2020-08-26 MED ORDER — MAGNESIUM SULFATE IN D5W 1-5 GM/100ML-% IV SOLN
1.0000 g | Freq: Once | INTRAVENOUS | Status: AC
  Administered 2020-08-26: 1 g via INTRAVENOUS
  Filled 2020-08-26: qty 100

## 2020-08-26 MED ORDER — DEXTROSE 10 % IV SOLN: INTRAVENOUS | Status: DC

## 2020-08-26 MED ORDER — DEXTROSE 5 % IV SOLN: INTRAVENOUS | Status: DC

## 2020-08-26 MED ORDER — POTASSIUM CHLORIDE 10 MEQ/100ML IV SOLN
10.0000 meq | INTRAVENOUS | Status: AC
  Administered 2020-08-26 (×4): 10 meq via INTRAVENOUS
  Filled 2020-08-26 (×4): qty 100

## 2020-08-26 MED ORDER — DEXTROSE 50 % IV SOLN
50.0000 mL | Freq: Once | INTRAVENOUS | Status: AC
  Administered 2020-08-26: 50 mL via INTRAVENOUS
  Filled 2020-08-26: qty 50

## 2020-08-26 MED ORDER — NICOTINE 21 MG/24HR TD PT24
21.0000 mg | MEDICATED_PATCH | Freq: Every day | TRANSDERMAL | Status: DC
  Filled 2020-08-26: qty 1

## 2020-08-26 MED ORDER — FENTANYL CITRATE (PF) 100 MCG/2ML IJ SOLN
25.0000 ug | INTRAMUSCULAR | Status: DC | PRN
  Administered 2020-08-26 – 2020-08-27 (×5): 25 ug via INTRAVENOUS
  Filled 2020-08-26 (×5): qty 2

## 2020-08-26 MED ORDER — POTASSIUM CHLORIDE CRYS ER 20 MEQ PO TBCR
40.0000 meq | EXTENDED_RELEASE_TABLET | Freq: Once | ORAL | Status: AC
  Administered 2020-08-26: 40 meq via ORAL
  Filled 2020-08-26: qty 2

## 2020-08-26 MED ORDER — SODIUM CHLORIDE 0.9 % IV BOLUS (SEPSIS)
1000.0000 mL | Freq: Once | INTRAVENOUS | Status: AC
  Administered 2020-08-26: 1000 mL via INTRAVENOUS

## 2020-08-26 MED ORDER — LORAZEPAM 2 MG/ML IJ SOLN: 1.0000 mg | INTRAMUSCULAR | Status: DC | PRN

## 2020-08-26 MED ORDER — DEXTROSE 50 % IV SOLN
1.0000 | Freq: Once | INTRAVENOUS | Status: AC
  Administered 2020-08-26: 50 mL via INTRAVENOUS
  Filled 2020-08-26: qty 50

## 2020-08-26 MED ORDER — INSULIN ASPART 100 UNIT/ML IJ SOLN
0.0000 [IU] | Freq: Every day | INTRAMUSCULAR | Status: DC
  Administered 2020-08-26: 2 [IU] via SUBCUTANEOUS
  Filled 2020-08-26: qty 1

## 2020-08-26 MED ORDER — ONDANSETRON HCL 4 MG/2ML IJ SOLN
4.0000 mg | INTRAMUSCULAR | Status: DC | PRN
  Administered 2020-08-26 – 2020-08-27 (×2): 4 mg via INTRAVENOUS
  Filled 2020-08-26: qty 2

## 2020-08-26 MED ORDER — KETOROLAC TROMETHAMINE 15 MG/ML IJ SOLN
15.0000 mg | Freq: Three times a day (TID) | INTRAMUSCULAR | Status: DC | PRN
  Administered 2020-08-26 (×2): 15 mg via INTRAVENOUS
  Filled 2020-08-26 (×5): qty 1

## 2020-08-26 MED ORDER — ONDANSETRON HCL 4 MG/2ML IJ SOLN
4.0000 mg | Freq: Three times a day (TID) | INTRAMUSCULAR | Status: DC | PRN
  Administered 2020-08-26: 4 mg via INTRAVENOUS
  Filled 2020-08-26 (×2): qty 2

## 2020-08-26 MED ORDER — POTASSIUM CHLORIDE CRYS ER 20 MEQ PO TBCR
40.0000 meq | EXTENDED_RELEASE_TABLET | Freq: Once | ORAL | Status: DC
  Filled 2020-08-26: qty 2

## 2020-08-26 MED ORDER — DEXTROSE 50 % IV SOLN: 50.0000 mL | INTRAVENOUS | Status: DC | PRN

## 2020-08-26 MED ORDER — INSULIN ASPART 100 UNIT/ML IJ SOLN
0.0000 [IU] | Freq: Three times a day (TID) | INTRAMUSCULAR | Status: DC
  Administered 2020-08-27: 1 [IU] via SUBCUTANEOUS
  Administered 2020-08-27: 3 [IU] via SUBCUTANEOUS
  Filled 2020-08-26 (×2): qty 1

## 2020-08-26 NOTE — ED Notes (Signed)
Pt CBG 67, waiting for D10 to come from supply chain. Pt given 4 oz of orange juice. EDP aware.

## 2020-08-26 NOTE — ED Notes (Addendum)
This RN unable to reach Supply Chain; secretary notified to place transport ticket to receive D10.

## 2020-08-26 NOTE — ED Notes (Signed)
Patient made aware of need for urine specimen; unable to provide one at this time.

## 2020-08-26 NOTE — ED Notes (Signed)
RN in room to given medications, pt rocking in bed stating that she is in pain, pt states that her stomach hurts. Pt informed no pain medications are order but will speak with attending to see if they will order. Pt refuses to take PO potassium at this time.

## 2020-08-26 NOTE — ED Notes (Signed)
Attending MD at bedside.

## 2020-08-26 NOTE — ED Notes (Signed)
Patient is very difficult stick; PIV placement via ultrasound.  Unable to collect 2 sets blood cultures due to this reason; lab notified to collect second set of cultures.

## 2020-08-26 NOTE — ED Notes (Signed)
[place] RN aware of assigned bed

## 2020-08-26 NOTE — ED Notes (Signed)
RN in room due to monitor beeping. Pt sitting up in bed rocking back and forth still. Pt is eating her meal tray at this time. Pt states that the Toradol has helped some but that she is still having pain. Pt request more coffee.  Pt was given another cup of coffee.

## 2020-08-26 NOTE — ED Triage Notes (Signed)
Pt from home via EMS with low blood sugar, per EMS CBG upon their arrival 28. Pt is awake, confused, reports she is very cold, pt clammy. Pt coming from home, history of DM and Lupus

## 2020-08-26 NOTE — Plan of Care (Signed)
Neuro: A&OX4, Patient very anxious-rocking back and forth/moving entire bed, moaning for most of the shift until abdominal pain decreased. Pain eventually under control with fentanyl and toradol Resp: stable on room air, intermittently tachypneic CV: afebrile, hypertension with pain and movement, cardiac monitor reading not clear due to patient rocking and moving for most of shift.  GIGU: tolerating PO well with very good appetite, utilizing bedside commode Skin: clean dry and intact Social: Daughter at bedside, all questions and concerns addressed  Events: Admitted to ICU from ED this afternoon.  Problem: Education: Goal: Knowledge of General Education information will improve Description: Including pain rating scale, medication(s)/side effects and non-pharmacologic comfort measures Outcome: Progressing   Problem: Health Behavior/Discharge Planning: Goal: Ability to manage health-related needs will improve Outcome: Progressing   Problem: Clinical Measurements: Goal: Ability to maintain clinical measurements within normal limits will improve Outcome: Progressing Goal: Will remain free from infection Outcome: Progressing Goal: Diagnostic test results will improve Outcome: Progressing Goal: Respiratory complications will improve Outcome: Progressing Goal: Cardiovascular complication will be avoided Outcome: Progressing   Problem: Activity: Goal: Risk for activity intolerance will decrease Outcome: Progressing   Problem: Nutrition: Goal: Adequate nutrition will be maintained Outcome: Progressing   Problem: Coping: Goal: Level of anxiety will decrease Outcome: Progressing   Problem: Elimination: Goal: Will not experience complications related to bowel motility Outcome: Progressing Goal: Will not experience complications related to urinary retention Outcome: Progressing   Problem: Pain Managment: Goal: General experience of comfort will improve Outcome: Progressing    Problem: Safety: Goal: Ability to remain free from injury will improve Outcome: Progressing   Problem: Skin Integrity: Goal: Risk for impaired skin integrity will decrease Outcome: Progressing

## 2020-08-26 NOTE — ED Notes (Signed)
Message sent to attending MD asking for pain medication for pt. Order for Ketorolac and IV Protonix entered by attending MD

## 2020-08-26 NOTE — ED Notes (Signed)
Pt sitting in bed, singing

## 2020-08-26 NOTE — ED Notes (Signed)
Bair Hugger placed on patient.

## 2020-08-26 NOTE — H&P (Addendum)
History and Physical    Rachael Gray VXB:[id number] DOB: 1980/11/23 DOA: 08/26/2020  Referring MD/NP/PA:   PCP: [facility] [place]   Patient coming from:  The patient is coming from home.  At baseline, pt is independent for most of ADL.        Chief Complaint: shaking, feeling cold, diaphoresis and weakness.  HPI: Rachael Gray is a 40 y.o. female with medical history significant of diabetes mellitus, GERD, anxiety, seizure/pseudoseizure, lupus, tobacco abuse, duodenitis, esophagitis, GERD, gastroparesis, anemia, DKA, substance abuse, who presents with shaking, feeling cold, diaphoresis and weakness.  Pt states that she took her normal dose of 15 units of 70/30 insulin at about 9:30 last night, and went to bed.  At about 11:30 PM, she started feeling bad, including shaking, diaphoresis, feeling cold and weak.  Her mother called EMS. Per EMS report, patient was found to have blood sugar in 30s.  Initially patient did not have GI symptoms, no nausea, vomiting, diarrhea or abdominal pain.  No chest pain, cough, shortness of breath.  Denies fever or chills.  Denies symptoms of UTI.  In the emergency room, patient was treated with D50, and D10 infusion is ordered, but not started yet.  When I saw patient in ED, she had 1 episode of nausea, vomiting which is nonbilious nonbloody vomiting.  She also reports diffuse abdominal pain, which she attributes to gastroparesis.  No diarrhea.  Currently patient does not have confusion, she is alert and oriented x3.  She moves all extremities normally.  ED Course: pt was found to have current blood sugar 67, WBC 7.3, negative COVID PCR, alcohol level less than 10, pending urinalysis, potassium 2.9, magnesium 1.7, lactic acid 2.0, temperature 93 --> improved to 97 on Bair hugger, blood pressure 135/85, heart rate of 64, RR 16, oxygen saturation 97% on room air.  Chest x-ray negative.  Patient is placed on progressive bed for  observation.  Review of Systems:   General: no fevers, chills, no body weight gain, has poor appetite, has fatigue, diaphoresis, feeling cold HEENT: no blurry vision, hearing changes or sore throat Respiratory: no dyspnea, coughing, wheezing CV: no chest pain, no palpitations GI: has nausea, vomiting, abdominal pain, no diarrhea, constipation GU: no dysuria, burning on urination, increased urinary frequency, hematuria  Ext: no leg edema Neuro: no unilateral weakness, numbness, or tingling, no vision change or hearing loss Skin: no rash, no skin tear. MSK: No muscle spasm, no deformity, no limitation of range of movement in spin Heme: No easy bruising.  Travel history: No recent long distant travel.  Allergy:  Allergies  Allergen Reactions  . Dilantin [Phenytoin Sodium Extended] Hives  . Doxycycline Swelling  . Other   . Morphine And Related Rash  . Tylenol [Acetaminophen] Rash    Past Medical History:  Diagnosis Date  . Collagen vascular disease ([place])   . Diabetes mellitus without complication ([place])   . Gastroparesis   . Lupus ([place])   . Pseudoseizures ([place])   . Seizures ([place])     Past Surgical History:  Procedure Laterality Date  . APPENDECTOMY    . CHOLECYSTECTOMY    . ESOPHAGOGASTRODUODENOSCOPY N/A 06/28/2020   Procedure: ESOPHAGOGASTRODUODENOSCOPY (EGD);  Surgeon: Virgel Manifold, MD;  Location: [facility] ENDOSCOPY;  Service: Endoscopy;  Laterality: N/A;    Social History:  reports that she has been smoking cigarettes. She has a 5.00 pack-year smoking history. She has never used smokeless tobacco. She reports current drug use. Drug: Marijuana.  She reports that she does not drink alcohol.  Family History:  Family History  Problem Relation Age of Onset  . Breast cancer Maternal Grandmother      Prior to Admission medications   Medication Sig Start Date End Date Taking? Authorizing Provider  benzonatate (TESSALON PERLES) 100 MG capsule Take 1 capsule (100 mg  total) by mouth 3 (three) times daily as needed for up to 7 days for cough. 08/25/20 09/01/20  Lannie Fields, PA-C  insulin NPH-regular Human (NOVOLIN 70/30) (70-30) 100 UNIT/ML injection Inject 10 units under the skin twice daily with breakfast and dinner 01/28/20   Nicole Kindred A, DO  metoCLOPramide (REGLAN) 10 MG tablet Take 1 tablet (10 mg total) by mouth every 8 (eight) hours as needed. Patient not taking: No sig reported 02/25/17   Loney Hering, MD  metoprolol tartrate (LOPRESSOR) 25 MG tablet Take 0.5 tablets (12.5 mg total) by mouth 2 (two) times daily. 06/30/20 07/30/20  Nolberto Hanlon, MD  ondansetron (ZOFRAN ODT) 4 MG disintegrating tablet Take 1 tablet (4 mg total) by mouth every 8 (eight) hours as needed for up to 5 days. 08/24/20 08/29/20  Lannie Fields, PA-C  pantoprazole (PROTONIX) 40 MG tablet Take 1 tablet (40 mg total) by mouth 2 (two) times daily. 06/30/20 07/30/20  Nolberto Hanlon, MD    Physical Exam: Vitals:   08/26/20 0600 08/26/20 0630 08/26/20 0700 08/26/20 0730  BP: 111/65 117/70 (!) 101/57 109/65  Pulse: (!) 58 92 78 94  Resp: 13 17 15 14   Temp:   (!) 97.5 F (36.4 C)   TempSrc:   Oral   SpO2: 100% 100% 100% 99%  Weight:      Height:       General: Not in acute distress HEENT:       Eyes: PERRL, EOMI, no scleral icterus.       ENT: No discharge from the ears and nose, no pharynx injection, no tonsillar enlargement.        Neck: No JVD, no bruit, no mass felt. Heme: No neck lymph node enlargement. Cardiac: S1/S2, RRR, No murmurs, No gallops or rubs. Respiratory: No rales, wheezing, rhonchi or rubs. GI: Soft, nondistended, mild diffused tenderness, no rebound pain, no organomegaly, BS present. GU: No hematuria Ext: No pitting leg edema bilaterally. 1+DP/PT pulse bilaterally. Musculoskeletal: No joint deformities, No joint redness or warmth, no limitation of ROM in spin. Skin: No rashes.  Neuro: Alert, oriented X3, cranial nerves II-XII grossly intact, moves all  extremities normally.  Psych: Patient is not psychotic, no suicidal or hemocidal ideation.  Labs on Admission: I have personally reviewed following labs and imaging studies  CBC: Recent Labs  Lab 08/24/20 2246 08/26/20 0511  WBC 7.3 7.3  NEUTROABS 4.0 5.2  HGB 11.4* 11.0*  HCT 35.0* 33.8*  MCV 79.7* 79.7*  PLT 298 096   Basic Metabolic Panel: Recent Labs  Lab 08/24/20 2246 08/26/20 0511  NA 134* 133*  K 3.7 2.9*  CL 98 95*  CO2 23 28  GLUCOSE 307* 325*  BUN 22* 22*  CREATININE 0.63 0.54  CALCIUM 9.6 9.3  MG  --  1.7   GFR: Estimated Creatinine Clearance: 90.3 mL/min (by C-G formula based on SCr of 0.54 mg/dL). Liver Function Tests: Recent Labs  Lab 08/24/20 2246 08/26/20 0537  AST 19 12*  ALT 9 5  ALKPHOS 75 66  BILITOT 1.1 0.4  PROT 9.0* 8.1  ALBUMIN 3.9 3.4*   Recent Labs  Lab 08/24/20  2246  LIPASE 27   No results for input(s): AMMONIA in the last 168 hours. Coagulation Profile: No results for input(s): INR, PROTIME in the last 168 hours. Cardiac Enzymes: No results for input(s): CKTOTAL, CKMB, CKMBINDEX, TROPONINI in the last 168 hours. BNP (last 3 results) No results for input(s): PROBNP in the last 8760 hours. HbA1C: No results for input(s): HGBA1C in the last 72 hours. CBG: Recent Labs  Lab 08/26/20 0449 08/26/20 0527 08/26/20 0641 08/26/20 0737  GLUCAP 34* 186* 116* 67*   Lipid Profile: No results for input(s): CHOL, HDL, LDLCALC, TRIG, CHOLHDL, LDLDIRECT in the last 72 hours. Thyroid Function Tests: No results for input(s): TSH, T4TOTAL, FREET4, T3FREE, THYROIDAB in the last 72 hours. Anemia Panel: No results for input(s): VITAMINB12, FOLATE, FERRITIN, TIBC, IRON, RETICCTPCT in the last 72 hours. Urine analysis:    Component Value Date/Time   COLORURINE AMBER (A) 08/24/2020 2232   APPEARANCEUR CLOUDY (A) 08/24/2020 2232   APPEARANCEUR HAZY 06/15/2014 2100   LABSPEC 1.028 08/24/2020 2232   LABSPEC 1.018 06/15/2014 2100    PHURINE 5.0 08/24/2020 2232   GLUCOSEU >=500 (A) 08/24/2020 2232   GLUCOSEU >=500 mg/dL 06/15/2014 2100   HGBUR SMALL (A) 08/24/2020 2232   BILIRUBINUR NEGATIVE 08/24/2020 2232   BILIRUBINUR NEGATIVE 06/15/2014 2100   KETONESUR 5 (A) 08/24/2020 2232   PROTEINUR >=300 (A) 08/24/2020 2232   UROBILINOGEN 1.0 02/09/2008 1519   NITRITE NEGATIVE 08/24/2020 2232   LEUKOCYTESUR SMALL (A) 08/24/2020 2232   LEUKOCYTESUR TRACE 06/15/2014 2100   Sepsis Labs: @LABRCNTIP (procalcitonin:4,lacticidven:4) ) Recent Results (from the past 240 hour(s))  Resp Panel by RT-PCR (Flu A&B, Covid) Nasopharyngeal Swab     Status: None   Collection Time: 08/24/20 10:30 PM   Specimen: Nasopharyngeal Swab; Nasopharyngeal(NP) swabs in vial transport medium  Result Value Ref Range Status   SARS Coronavirus 2 by RT PCR NEGATIVE NEGATIVE Final    Comment: (NOTE) SARS-CoV-2 target nucleic acids are NOT DETECTED.  The SARS-CoV-2 RNA is generally detectable in upper respiratory specimens during the acute phase of infection. The lowest concentration of SARS-CoV-2 viral copies this assay can detect is 138 copies/mL. A negative result does not preclude SARS-Cov-2 infection and should not be used as the sole basis for treatment or other patient management decisions. A negative result may occur with  improper specimen collection/handling, submission of specimen other than nasopharyngeal swab, presence of viral mutation(s) within the areas targeted by this assay, and inadequate number of viral copies(<138 copies/mL). A negative result must be combined with clinical observations, patient history, and epidemiological information. The expected result is Negative.  Fact Sheet for Patients:  EntrepreneurPulse.com.au  Fact Sheet for Healthcare Providers:  IncredibleEmployment.be  This test is no t yet approved or cleared by the [place] FDA and  has been authorized for detection  and/or diagnosis of SARS-CoV-2 by FDA under an Emergency Use Authorization (EUA). This EUA will remain  in effect (meaning this test can be used) for the duration of the COVID-19 declaration under Section 564(b)(1) of the Act, 21 U.S.C.section 360bbb-3(b)(1), unless the authorization is terminated  or revoked sooner.       Influenza A by PCR NEGATIVE NEGATIVE Final   Influenza B by PCR NEGATIVE NEGATIVE Final    Comment: (NOTE) The Xpert Xpress SARS-CoV-2/FLU/RSV plus assay is intended as an aid in the diagnosis of influenza from Nasopharyngeal swab specimens and should not be used as a sole basis for treatment. Nasal washings and aspirates are unacceptable for Xpert Xpress  SARS-CoV-2/FLU/RSV testing.  Fact Sheet for Patients: EntrepreneurPulse.com.au  Fact Sheet for Healthcare Providers: IncredibleEmployment.be  This test is not yet approved or cleared by the [place] FDA and has been authorized for detection and/or diagnosis of SARS-CoV-2 by FDA under an Emergency Use Authorization (EUA). This EUA will remain in effect (meaning this test can be used) for the duration of the COVID-19 declaration under Section 564(b)(1) of the Act, 21 U.S.C. section 360bbb-3(b)(1), unless the authorization is terminated or revoked.  Performed at [facility], [place], [place] [address]   Resp Panel by RT-PCR (Flu A&B, Covid) Nasopharyngeal Swab     Status: None   Collection Time: 08/26/20  5:37 AM   Specimen: Nasopharyngeal Swab; Nasopharyngeal(NP) swabs in vial transport medium  Result Value Ref Range Status   SARS Coronavirus 2 by RT PCR NEGATIVE NEGATIVE Final    Comment: (NOTE) SARS-CoV-2 target nucleic acids are NOT DETECTED.  The SARS-CoV-2 RNA is generally detectable in upper respiratory specimens during the acute phase of infection. The lowest concentration of SARS-CoV-2 viral copies this assay can detect is 138  copies/mL. A negative result does not preclude SARS-Cov-2 infection and should not be used as the sole basis for treatment or other patient management decisions. A negative result may occur with  improper specimen collection/handling, submission of specimen other than nasopharyngeal swab, presence of viral mutation(s) within the areas targeted by this assay, and inadequate number of viral copies(<138 copies/mL). A negative result must be combined with clinical observations, patient history, and epidemiological information. The expected result is Negative.  Fact Sheet for Patients:  EntrepreneurPulse.com.au  Fact Sheet for Healthcare Providers:  IncredibleEmployment.be  This test is no t yet approved or cleared by the [place] FDA and  has been authorized for detection and/or diagnosis of SARS-CoV-2 by FDA under an Emergency Use Authorization (EUA). This EUA will remain  in effect (meaning this test can be used) for the duration of the COVID-19 declaration under Section 564(b)(1) of the Act, 21 U.S.C.section 360bbb-3(b)(1), unless the authorization is terminated  or revoked sooner.       Influenza A by PCR NEGATIVE NEGATIVE Final   Influenza B by PCR NEGATIVE NEGATIVE Final    Comment: (NOTE) The Xpert Xpress SARS-CoV-2/FLU/RSV plus assay is intended as an aid in the diagnosis of influenza from Nasopharyngeal swab specimens and should not be used as a sole basis for treatment. Nasal washings and aspirates are unacceptable for Xpert Xpress SARS-CoV-2/FLU/RSV testing.  Fact Sheet for Patients: EntrepreneurPulse.com.au  Fact Sheet for Healthcare Providers: IncredibleEmployment.be  This test is not yet approved or cleared by the [place] FDA and has been authorized for detection and/or diagnosis of SARS-CoV-2 by FDA under an Emergency Use Authorization (EUA). This EUA will remain in effect (meaning  this test can be used) for the duration of the COVID-19 declaration under Section 564(b)(1) of the Act, 21 U.S.C. section 360bbb-3(b)(1), unless the authorization is terminated or revoked.  Performed at [facility], [address] [place], [place] [address]      Radiological Exams on Admission: DG Chest Portable 1 View  Result Date: 08/26/2020 CLINICAL DATA:  40 year old female with hyperglycemia, hypothermia. EXAM: PORTABLE CHEST 1 VIEW COMPARISON:  Portable chest 06/26/2020 and earlier. FINDINGS: Portable AP upright view at 0532 hours. Normal lung volumes and mediastinal contours. Visualized tracheal air column is within normal limits. Allowing for portable technique the lungs are clear. No pneumothorax or pleural effusion. Negative visible bowel gas and  osseous structures. IMPRESSION: Negative portable chest. Electronically Signed   By: Genevie Ann M.D.   On: 08/26/2020 05:57     EKG: I have personally reviewed.  Sinus rhythm, QTC 503, nonspecific T wave change   Assessment/Plan Principal Problem:   Hypoglycemia Active Problems:   Nausea & vomiting   Hypokalemia   Tobacco abuse disorder   GERD (gastroesophageal reflux disease)   Hypothermia   Seizures (HCC)   Lupus (HCC)   Poorly controlled type 2 diabetes mellitus with gastroparesis (HCC)   Abdominal pain   Hypoglycemia: Blood sugar was down to 30s, patient was treated with D50, current blood sugar was 67.  D10 infusion is ordered, but not available at this moment, will temporarily started D5 now until D10 is available.  Etiology is not clear.  On patient medication list, patient is supposed to take 70/30 insulin 10 unit twice daily, but she reported to me that she took 15 units at about 9:30 last night, which is likely the reason.  No source of infection identified, does not seem to have sepsis except for lactic acid 2.0.  -Please to stepdown unit for observation (due to the need of CBG checking q1h) -D10 at 125  cc/h -D50 as needed -CBG every hour -Hold insulin 70/30 -Give 50 mg of Solu-Cortef -Follow-up urinalysis, blood culture, urine culture  Hypothermia: Most likely due to hypoglycemia.  Temperature 93, which improved to 97 on Bair hugger -Bair hugger  Hypokalemia: Potassium 2.9.  Magnesium 1.7 -Repleted potassium with 10 mEq of KCl x 4 by IV and 40 mEq x2 orally -Will give 1 g of magnesium sulfate  Nausea & vomiting and abdominal pain: Possibly due to gastroparesis -As needed Zofran -As needed ketorolac (patient has history of substance abuse including narcotic abuse, will not give narcotics) -IV Protonix 40 mg twice daily -check lipase -->44 -f/u CT-abd/pelvis  Tobacco abuse disorder -Nicotine patch  GERD (gastroesophageal reflux disease) -Switch oral to IV Protonix, 40 mg twice daily  Seizures and pseudoseizure: Patient is not on medications currently -Seizure precaution -When necessary Ativan for seizure  Lupus [facility]): Not taking medications currently -No acute issues  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  Patient is taking 70/30 insulin.  -Hold 70/30 insulin     DVT ppx: SQ Lovenox Code Status: Full code Family Communication:  Yes, patient's mother by phone Disposition Plan:  Anticipate discharge back to previous environment Consults called:  none Admission status and Level of care: Stepdown:    for obs      Status is: Observation  The patient remains OBS appropriate and will d/c before 2 midnights.  Dispo: The patient is from: Home              Anticipated d/c is to: Home              Patient currently is not medically stable to d/c.   Difficult to place patient No          Date of Service 08/26/2020    Ivor Costa [facility]   If 7PM-7AM, please contact night-coverage [email or web address] 08/26/2020, 8:16 AM

## 2020-08-26 NOTE — Progress Notes (Signed)
Patient transferred to room 138 via wheelchair by this RN and Nurse Tech. Report given to [place], RN prior to transfer. Patient alert and oriented, vital signs stable at time of transfer.

## 2020-08-26 NOTE — ED Notes (Signed)
Levada Dy RN aware of assigned bed

## 2020-08-26 NOTE — ED Provider Notes (Signed)
[facility] Emergency Department Provider Note  ____________________________________________   Event Date/Time   First MD Initiated Contact with Patient 08/26/20 [phone]     (approximate)  I have reviewed the triage vital signs and the nursing notes.   HISTORY  Chief Complaint Hypoglycemia    HPI Rachael Gray is a 40 y.o. female with history of lupus, seizures, gastroparesis, insulin-dependent diabetes, substance abuse who presents to the emergency department for concerns for hypoglycemia.  Per EMS blood sugar was in the 30s.  Not improving with increased oral intake.  Patient was diaphoretic.  Patient has no current complaints.  She denies any pain.  No recent vomiting or diarrhea.  Reported to EMS that she smoked marijuana tonight.  She is unable to tell me why her blood sugar was low today.  It appears she is on Novolin 70/30 10 units twice daily.  No family at bedside.        Past Medical History:  Diagnosis Date  . Collagen vascular disease ([place])   . Diabetes mellitus without complication ([place])   . Gastroparesis   . Lupus ([place])   . Pseudoseizures ([place])   . Seizures [facility])     Patient Active Problem List   Diagnosis Date Noted  . Abnormal CT scan, esophagus   . Acute esophagitis   . Gastric erythema   . DKA (diabetic ketoacidosis) ([place]) 01/25/2020  . Tobacco abuse disorder 01/25/2020  . SIRS due to non-infectious process without acute organ dysfunction ([place]) 01/25/2020  . DKA, type 2 ([place]) 01/25/2020  . Cannabis use disorder, moderate, dependence ([place]) 12/01/2016  . 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 Hypokalemia 12/24/2015  . Anemia 12/24/2015  . Diabetes ([place]) 12/24/2015  . Gastroparesis due to DM ([place])   . Acute respiratory alkalosis 12/22/2015  . Duodenitis 12/22/2015  . Vomiting 12/16/2015  . Nausea & vomiting 12/10/2014  . Narcotic abuse ([place]) 12/09/2014  . Sepsis ([place]) 12/07/2014  . Anxiety   . Pyelonephritis  12/04/2014  . Lactic acidosis 12/04/2014    Class: Acute    Past Surgical History:  Procedure Laterality Date  . APPENDECTOMY    . CHOLECYSTECTOMY    . ESOPHAGOGASTRODUODENOSCOPY N/A 06/28/2020   Procedure: ESOPHAGOGASTRODUODENOSCOPY (EGD);  Surgeon: Virgel Manifold, MD;  Location[facility] ENDOSCOPY;  Service: Endoscopy;  Laterality: N/A;    Prior to Admission medications   Medication Sig Start Date End Date Taking? Authorizing Provider  benzonatate (TESSALON PERLES) 100 MG capsule Take 1 capsule (100 mg total) by mouth 3 (three) times daily as needed for up to 7 days for cough. 08/25/20 09/01/20  Lannie Fields, PA-C  insulin NPH-regular Human (NOVOLIN 70/30) (70-30) 100 UNIT/ML injection Inject 10 units under the skin twice daily with breakfast and dinner 01/28/20   Nicole Kindred A, DO  metoCLOPramide (REGLAN) 10 MG tablet Take 1 tablet (10 mg total) by mouth every 8 (eight) hours as needed. Patient not taking: No sig reported 02/25/17   Loney Hering, MD  metoprolol tartrate (LOPRESSOR) 25 MG tablet Take 0.5 tablets (12.5 mg total) by mouth 2 (two) times daily. 06/30/20 07/30/20  Nolberto Hanlon, MD  ondansetron (ZOFRAN ODT) 4 MG disintegrating tablet Take 1 tablet (4 mg total) by mouth every 8 (eight) hours as needed for up to 5 days. 08/24/20 08/29/20  Lannie Fields, PA-C  pantoprazole (PROTONIX) 40 MG tablet Take 1 tablet (40 mg total) by mouth 2 (two) times daily. 06/30/20 07/30/20  Nolberto Hanlon, MD  Allergies Dilantin [phenytoin sodium extended], Doxycycline, Other, Morphine and related, and Tylenol [acetaminophen]  Family History  Problem Relation Age of Onset  . Breast cancer Maternal Grandmother     Social History Social History   Tobacco Use  . Smoking status: Current Every Day Smoker    Packs/day: 0.50    Years: 10.00    Pack years: 5.00    Types: Cigarettes  . Smokeless tobacco: Never Used  Vaping Use  . Vaping Use: Never used  Substance Use Topics  . Alcohol use:  No  . Drug use: Yes    Types: Marijuana    Comment: yesterday    Review of Systems Constitutional: No fever. Eyes: No visual changes. ENT: No sore throat. Cardiovascular: Denies chest pain. Respiratory: Denies shortness of breath. Gastrointestinal: No nausea, vomiting, diarrhea. Genitourinary: Negative for dysuria. Musculoskeletal: Negative for back pain. Skin: Negative for rash. Neurological: Negative for focal weakness or numbness.  ____________________________________________   PHYSICAL EXAM:  VITAL SIGNS: ED Triage Vitals  Enc Vitals Group     BP      Pulse      Resp      Temp      Temp src      SpO2      Weight      Height      Head Circumference      Peak Flow      Pain Score      Pain Loc      Pain Edu?      Excl. in [place]?    CONSTITUTIONAL: Alert and oriented x 3 and responds appropriately to questions.  Chronically ill-appearing, in no distress HEAD: Normocephalic EYES: Conjunctivae clear, pupils appear equal, EOM appear intact ENT: normal nose; moist mucous membranes NECK: Supple, normal ROM CARD: RRR; S1 and S2 appreciated; no murmurs, no clicks, no rubs, no gallops RESP: Normal chest excursion without splinting or tachypnea; breath sounds clear and equal bilaterally; no wheezes, no rhonchi, no rales, no hypoxia or respiratory distress, speaking full sentences ABD/GI: Normal bowel sounds; non-distended; soft, non-tender, no rebound, no guarding, no peritoneal signs, no hepatosplenomegaly BACK: The back appears normal EXT: Normal ROM in all joints; no deformity noted, no edema; no cyanosis SKIN: Normal color for age and race; skin is cool to touch NEURO: Moves all extremities equally, normal speech, no facial asymmetry PSYCH: The patient's mood and manner are appropriate.  ____________________________________________   LABS (all labs ordered are listed, but only abnormal results are displayed)  Labs Reviewed  CBC WITH DIFFERENTIAL/PLATELET -  Abnormal; Notable for the following components:      Result Value   Hemoglobin 11.0 (*)    HCT 33.8 (*)    MCV 79.7 (*)    MCH 25.9 (*)    All other components within normal limits  BASIC METABOLIC PANEL - Abnormal; Notable for the following components:   Sodium 133 (*)    Potassium 2.9 (*)    Chloride 95 (*)    Glucose, Bld 325 (*)    BUN 22 (*)    All other components within normal limits  CBG MONITORING, ED - Abnormal; Notable for the following components:   Glucose-Capillary 34 (*)    All other components within normal limits  CBG MONITORING, ED - Abnormal; Notable for the following components:   Glucose-Capillary 186 (*)    All other components within normal limits  CULTURE, BLOOD (ROUTINE X 2)  CULTURE, BLOOD (ROUTINE X 2)  URINE CULTURE  RESP PANEL BY RT-PCR (FLU A&B, COVID) ARPGX2  URINALYSIS, COMPLETE (UACMP) WITH MICROSCOPIC  LACTIC ACID, PLASMA  URINE DRUG SCREEN, QUALITATIVE ([facility] ONLY)  ETHANOL  PROCALCITONIN  HEPATIC FUNCTION PANEL  MAGNESIUM  CBG MONITORING, ED  CBG MONITORING, ED  CBG MONITORING, ED  CBG MONITORING, ED  CBG MONITORING, ED  CBG MONITORING, ED  CBG MONITORING, ED  CBG MONITORING, ED  CBG MONITORING, ED  CBG MONITORING, ED  CBG MONITORING, ED  CBG MONITORING, ED  CBG MONITORING, ED  CBG MONITORING, ED  CBG MONITORING, ED  CBG MONITORING, ED  CBG MONITORING, ED  CBG MONITORING, ED  POC URINE PREG, ED   ____________________________________________  EKG   EKG Interpretation  Date/Time:  Saturday August 26 2020 05:45:41 EDT Ventricular Rate:  62 PR Interval:  158 QRS Duration: 94 QT Interval:  495 QTC Calculation: 503 R Axis:   37 Text Interpretation: Sinus rhythm Borderline prolonged QT interval Confirmed by Pryor Curia [phone]) on 08/26/2020 5:47:41 AM       ____________________________________________  RADIOLOGY Jessie Foot Vivian Neuwirth, personally viewed and evaluated these images (plain radiographs) as part of my medical  decision making, as well as reviewing the written report by the radiologist.  ED MD interpretation: Chest x-ray clear.  Official radiology report(s): No results found.  ____________________________________________   PROCEDURES  Procedure(s) performed (including Critical Care):  Procedures  CRITICAL CARE Performed by: Pryor Curia   Total critical care time: 65 minutes  Critical care time was exclusive of separately billable procedures and treating other patients.  Critical care was necessary to treat or prevent imminent or life-threatening deterioration.  Critical care was time spent personally by me on the following activities: development of treatment plan with patient and/or surrogate as well as nursing, discussions with consultants, evaluation of patient's response to treatment, examination of patient, obtaining history from patient or surrogate, ordering and performing treatments and interventions, ordering and review of laboratory studies, ordering and review of radiographic studies, pulse oximetry and re-evaluation of patient's condition.  ____________________________________________   INITIAL IMPRESSION / ASSESSMENT AND PLAN / ED COURSE  As part of my medical decision making, I reviewed the following data within the [place] notes reviewed and incorporated, Labs reviewed , EKG interpreted , Old EKG reviewed, Old chart reviewed, Radiograph reviewed , Discussed with admitting physician  and Notes from prior ED visits         Patient here with hypoglycemia.  Not improving after eating and drinking at home or with oral glucose with EMS.  Blood sugar in the 30s here.  Will give D50.  Labs, urine pending.  Patient hypothermic here.  Likely from being hypoglycemic for an extended period of time but sepsis is also on the differential.  Will obtain cultures, lactic, urine, chest x-ray.  We will start warm IV fluids.  We will place her under a Surveyor, quantity.  Given she is otherwise hemodynamically stable, will hold antibiotics at this time.  She has 2 peripheral IVs.  ED PROGRESS  Patient's blood sugar had come up to 186 after D50 but has dropped again to 116.  Given her blood sugars are dropping quickly and she has hypothermic, will discuss with hospitalist for admission.  We will start D10 infusion.  Her lactic is minimally elevated at 2.0 but we have no obvious source of infection and no other signs of sepsis other than hypothermia which I suspect is due to her hypoglycemia.  No leukocytosis or leukopenia.  Normal  blood pressure.  Chest x-ray is clear.  Urine, COVID swabs, cultures pending.  I feel we can continue to hydrate patient and hold off on antibiotics at this time.  Patient's potassium is also low at 2.9 with new prolonged QT interval.  Magnesium 1.7.  Will give IV and oral potassium replacement.  She is on a cardiac monitor.  7:02 AM Discussed patient's case with hospitalist, Dr. Blaine Hamper.  I have recommended admission and patient (and family if present) agree with this plan. Admitting physician will place admission orders.   I reviewed all nursing notes, vitals, pertinent previous records and reviewed/interpreted all EKGs, lab and urine results, imaging (as available).   ____________________________________________   FINAL CLINICAL IMPRESSION(S) / ED DIAGNOSES  Final diagnoses:  Hypoglycemia  Hypothermia, initial encounter  Hypokalemia     ED Discharge Orders    None      *Please note:  Leiyah L Sancho was evaluated in Emergency Department on 08/26/2020 for the symptoms described in the history of present illness. She was evaluated in the context of the global COVID-19 pandemic, which necessitated consideration that the patient might be at risk for infection with the SARS-CoV-2 virus that causes COVID-19. Institutional protocols and algorithms that pertain to the evaluation of patients at risk for COVID-19 are in a state  of rapid change based on information released by regulatory bodies including the CDC and federal and state organizations. These policies and algorithms were followed during the patient's care in the ED.  Some ED evaluations and interventions may be delayed as a result of limited staffing during and the pandemic.*   Note:  This document was prepared using Dragon voice recognition software and may include unintentional dictation errors.   Akansha Wyche, Delice Bison, DO 08/26/20 [phone]

## 2020-08-26 NOTE — ED Notes (Signed)
Secure chat send to Dr. Blaine Hamper to verify that pt is supposed to be getting a total of 80 mEq of po potassium. Dr. Blaine Hamper verifies that this is correct.

## 2020-08-27 DIAGNOSIS — Z79899 Other long term (current) drug therapy: Secondary | ICD-10-CM | POA: Diagnosis not present

## 2020-08-27 DIAGNOSIS — T68XXXA Hypothermia, initial encounter: Secondary | ICD-10-CM | POA: Diagnosis not present

## 2020-08-27 DIAGNOSIS — Z20822 Contact with and (suspected) exposure to covid-19: Secondary | ICD-10-CM | POA: Diagnosis not present

## 2020-08-27 DIAGNOSIS — R739 Hyperglycemia, unspecified: Secondary | ICD-10-CM | POA: Diagnosis not present

## 2020-08-27 DIAGNOSIS — K3184 Gastroparesis: Secondary | ICD-10-CM | POA: Diagnosis not present

## 2020-08-27 DIAGNOSIS — Z888 Allergy status to other drugs, medicaments and biological substances status: Secondary | ICD-10-CM | POA: Diagnosis not present

## 2020-08-27 DIAGNOSIS — E1165 Type 2 diabetes mellitus with hyperglycemia: Secondary | ICD-10-CM | POA: Diagnosis not present

## 2020-08-27 DIAGNOSIS — M329 Systemic lupus erythematosus, unspecified: Secondary | ICD-10-CM | POA: Diagnosis not present

## 2020-08-27 DIAGNOSIS — E162 Hypoglycemia, unspecified: Secondary | ICD-10-CM | POA: Diagnosis not present

## 2020-08-27 DIAGNOSIS — R109 Unspecified abdominal pain: Secondary | ICD-10-CM | POA: Diagnosis not present

## 2020-08-27 DIAGNOSIS — K219 Gastro-esophageal reflux disease without esophagitis: Secondary | ICD-10-CM | POA: Diagnosis not present

## 2020-08-27 DIAGNOSIS — E1143 Type 2 diabetes mellitus with diabetic autonomic (poly)neuropathy: Secondary | ICD-10-CM | POA: Diagnosis not present

## 2020-08-27 DIAGNOSIS — F17210 Nicotine dependence, cigarettes, uncomplicated: Secondary | ICD-10-CM | POA: Diagnosis not present

## 2020-08-27 DIAGNOSIS — E876 Hypokalemia: Secondary | ICD-10-CM | POA: Diagnosis not present

## 2020-08-27 DIAGNOSIS — Z881 Allergy status to other antibiotic agents status: Secondary | ICD-10-CM | POA: Diagnosis not present

## 2020-08-27 DIAGNOSIS — R680 Hypothermia, not associated with low environmental temperature: Secondary | ICD-10-CM | POA: Diagnosis not present

## 2020-08-27 DIAGNOSIS — E11649 Type 2 diabetes mellitus with hypoglycemia without coma: Secondary | ICD-10-CM | POA: Diagnosis not present

## 2020-08-27 DIAGNOSIS — R569 Unspecified convulsions: Secondary | ICD-10-CM | POA: Diagnosis not present

## 2020-08-27 DIAGNOSIS — Z885 Allergy status to narcotic agent status: Secondary | ICD-10-CM | POA: Diagnosis not present

## 2020-08-27 DIAGNOSIS — Z794 Long term (current) use of insulin: Secondary | ICD-10-CM | POA: Diagnosis not present

## 2020-08-27 LAB — CBC
HCT: 31 % — ABNORMAL LOW (ref 36.0–46.0)
Hemoglobin: 10.1 g/dL — ABNORMAL LOW (ref 12.0–15.0)
MCH: 26.1 pg (ref 26.0–34.0)
MCHC: 32.6 g/dL (ref 30.0–36.0)
MCV: 80.1 fL (ref 80.0–100.0)
Platelets: 358 10*3/uL (ref 150–400)
RBC: 3.87 MIL/uL (ref 3.87–5.11)
RDW: 15.4 % (ref 11.5–15.5)
WBC: 7.3 10*3/uL (ref 4.0–10.5)
nRBC: 0 % (ref 0.0–0.2)

## 2020-08-27 LAB — BASIC METABOLIC PANEL
Anion gap: 7 (ref 5–15)
BUN: 19 mg/dL (ref 6–20)
CO2: 24 mmol/L (ref 22–32)
Calcium: 8.8 mg/dL — ABNORMAL LOW (ref 8.9–10.3)
Chloride: 105 mmol/L (ref 98–111)
Creatinine, Ser: 0.63 mg/dL (ref 0.44–1.00)
GFR, Estimated: >60 mL/min (ref >60)
Glucose, Bld: 179 mg/dL — ABNORMAL HIGH (ref 70–99)
Potassium: 3.5 mmol/L (ref 3.5–5.1)
Sodium: 136 mmol/L (ref 135–145)

## 2020-08-27 LAB — GLUCOSE, CAPILLARY
Glucose-Capillary: 208 mg/dL — ABNORMAL HIGH (ref 70–99)
Glucose-Capillary: 126 mg/dL — ABNORMAL HIGH (ref 70–99)

## 2020-08-27 MED ORDER — TRAMADOL HCL 50 MG PO TABS
50.0000 mg | ORAL_TABLET | Freq: Four times a day (QID) | ORAL | Status: DC
  Administered 2020-08-27: 50 mg via ORAL
  Filled 2020-08-27: qty 1

## 2020-08-27 MED ORDER — ALPRAZOLAM 0.25 MG PO TABS
0.2500 mg | ORAL_TABLET | Freq: Two times a day (BID) | ORAL | Status: DC | PRN
  Administered 2020-08-27: 0.25 mg via ORAL
  Filled 2020-08-27: qty 1

## 2020-08-27 MED ORDER — METOCLOPRAMIDE HCL 10 MG PO TABS: 10.0000 mg | ORAL_TABLET | Freq: Three times a day (TID) | ORAL | Status: DC

## 2020-08-27 NOTE — Plan of Care (Signed)

## 2020-08-27 NOTE — Discharge Summary (Addendum)
Physician Discharge Summary  Rachael Gray OVF:[id number] DOB: [birth year] DOA: 08/26/2020  PCP: [facility] date: 08/26/2020 Discharge date: 08/27/2020  Admitted From: home  Disposition:  Home  Recommendations for Outpatient Follow-up:  1. Follow up with PCP w/in 1 week 2. For your chronic pain, you should establish care w/ pain management physician   Home Health: no  Equipment/Devices:   Discharge Condition: stable  CODE STATUS: full  Diet recommendation: Heart Healthy / Carb Modified / Regular / Dysphagia   Brief/Interim Summary: HPI was taken from Dr. Blaine Hamper: Rachael Gray is a 40 y.o. female with medical history significant of diabetes mellitus, GERD, anxiety, seizure/pseudoseizure, lupus, tobacco abuse, duodenitis, esophagitis, GERD, gastroparesis, anemia, DKA, substance abuse, who presents with shaking, feeling cold, diaphoresis and weakness.  Pt states that she took her normal dose of 15 units of 70/30 insulin at about 9:30 last night, and went to bed.  At about 11:30 PM, she started feeling bad, including shaking, diaphoresis, feeling cold and weak.  Her mother called EMS. Per EMS report, patient was found to have blood sugar in 30s.  Initially patient did not have GI symptoms, no nausea, vomiting, diarrhea or abdominal pain.  No chest pain, cough, shortness of breath.  Denies fever or chills.  Denies symptoms of UTI.  In the emergency room, patient was treated with D50, and D10 infusion is ordered, but not started yet.  When I saw patient in ED, she had 1 episode of nausea, vomiting which is nonbilious nonbloody vomiting.  She also reports diffuse abdominal pain, which she attributes to gastroparesis.  No diarrhea.  Currently patient does not have confusion, she is alert and oriented x3.  She moves all extremities normally.  ED Course: pt was found to have current blood sugar 67, WBC 7.3, negative COVID PCR, alcohol level less than 10, pending  urinalysis, potassium 2.9, magnesium 1.7, lactic acid 2.0, temperature 93 --> improved to 97 on Bair hugger, blood pressure 135/85, heart rate of 64, RR 16, oxygen saturation 97% on room air.  Chest x-ray negative.  Patient is placed on progressive bed for observation.  Hospital course from Dr. Jimmye Norman 08/27/20: Pt presented w/ hypoglycemia which resolved prior to d/c w/ D10. Pt took 15 units prior to going to bed on the night prior to admission & was only suppose to take 10 units. No source of infection was identified. Furthermore, pt c/o abd pain and CT scan shows likely esophagitis. Pt has hx of gastroparesis. Pt was already on PPI and reglan was started inpatient. Pt stated only morphine or dilaudid would help with the pain. Pt had tramadol, fentanyl, toradol or tylenol but none of those medications helped her pain. Pt refused a lidocaine patch. Evidently pt has hx of narcotic abuse as per previous hospitalist. It was recommended that pt establish care w/ pain management physician for pt's chronic pain.    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 No source of infection identified. Sepsis r/o. Continue to monitor BS ACHS. Resolved   DM2:  poorly controlled w/ HbA1c 13.9. Continue on SSI w/ accuchecks   Hypothermia:most likely secondary to hypoglycemia. Resolved  Hypokalemia: WNL today   Nausea & vomitingandabdominal pain:possibly due to gastroparesis. Zofran prn. Continue on PPI. Started on reglan. CT abd/pelvis showed thickening in the distal esophagus,  esophagitis would be a common cause. Nausea/vomiting had resolved on day of d/c and pt was able to eat all of her breakfast on the morning of day of d/c   Hx of narcotic abuse: as per previous hospitalist.   Tobacco abuse disorder: nicotine patch to prevent w/drawal  GERD: continue on PPI   Seizuresand pseudoseizure: not on any medications currently. Seizure precaution  Lupus:not taking medications currently   Discharge Instructions  Discharge Instructions    Diet Carb Modified   Complete by: As directed    Discharge instructions   Complete by: As directed    For your chronic pain, you should establish care w/ a pain management physician. F/u w/ PCP w/in 1 week   Increase activity slowly   Complete by: As directed      Allergies as of 08/27/2020      Reactions   Dilantin [phenytoin Sodium Extended] Hives   Doxycycline Swelling   Other    Tomato Rash   Tylenol [acetaminophen] Rash      Medication List    TAKE these medications   benzonatate 100 MG capsule Commonly known as: Tessalon Perles Take 1 capsule (100 mg total) by mouth 3 (three) times daily as needed for up to 7 days for cough.   metoCLOPramide 10 MG tablet Commonly known as: REGLAN Take 1 tablet (10 mg total) by mouth every 8 (eight) hours as needed.   metoprolol tartrate 25 MG tablet Commonly known as: LOPRESSOR Take 0.5 tablets (12.5 mg total) by mouth 2 (two) times daily.   NovoLIN 70/30 (70-30) 100 UNIT/ML injection Generic drug: insulin NPH-regular Human Inject 10 units under the skin twice daily with breakfast and dinner   ondansetron 4 MG disintegrating tablet Commonly known as: Zofran ODT Take 1 tablet (4 mg total) by mouth every 8 (eight) hours as needed for up to 5 days.   pantoprazole 40 MG tablet Commonly known as: Protonix Take 1 tablet (40 mg total) by mouth 2 (two) times daily.       Allergies  Allergen Reactions  . Dilantin [Phenytoin Sodium Extended] Hives  .  Doxycycline Swelling  . Other   . Tomato Rash  . Tylenol [Acetaminophen] Rash    Consultations:     Procedures/Studies: CT ABDOMEN PELVIS WO CONTRAST  Result Date: 08/26/2020 CLINICAL DATA:  Abdominal pain.  Hypoglycemia.  Diabetes. EXAM: CT ABDOMEN AND PELVIS WITHOUT CONTRAST TECHNIQUE: Multidetector CT imaging of the abdomen and pelvis was performed following the standard protocol without IV contrast. COMPARISON:  Multiple exams, including 06/26/2020 FINDINGS: Lower chest: Continue nonspecific wall thickening in the distal esophagus; esophagitis would be a common cause. Hepatobiliary: Cholecystectomy.  Otherwise unremarkable Pancreas: Unremarkable Spleen: Unremarkable Adrenals/Urinary Tract: 1.5 by 1.2 cm left adrenal mass, internal density 9 Hounsfield units, favoring adenoma. No hydronephrosis or hydroureter. Vascular calcification versus 2 mm nonobstructive left renal calculus on image 53 series 5, adjacent 1 mm left mid kidney calcification on image 51 series 5. Stomach/Bowel: Unremarkable Vascular/Lymphatic: Aortoiliac atherosclerotic vascular disease. Small bilateral pelvic lymph nodes are not pathologically enlarged by size criteria. Reproductive: Unremarkable Other: No supplemental non-categorized findings. Musculoskeletal: Unremarkable IMPRESSION: 1. Punctate calcifications in the left mid kidney are probably from vascular calcifications and less  likely to be from tiny nonobstructive renal calculi. 2. Continue nonspecific wall thickening in the distal esophagus, esophagitis would be a common cause. 3.  Aortic Atherosclerosis (ICD10-I70.0). 4. Small left adrenal adenoma. Electronically Signed   By: Van Clines M.D.   On: 08/26/2020 12:29   DG Chest Portable 1 View  Result Date: 08/26/2020 CLINICAL DATA:  40 year old female with hyperglycemia, hypothermia. EXAM: PORTABLE CHEST 1 VIEW COMPARISON:  Portable chest 06/26/2020 and earlier. FINDINGS: Portable AP upright view at 0532 hours.  Normal lung volumes and mediastinal contours. Visualized tracheal air column is within normal limits. Allowing for portable technique the lungs are clear. No pneumothorax or pleural effusion. Negative visible bowel gas and osseous structures. IMPRESSION: Negative portable chest. Electronically Signed   By: Genevie Ann M.D.   On: 08/26/2020 05:57    (Echo, Carotid, EGD, Colonoscopy, ERCP)    Subjective:   Discharge Exam: Vitals:   08/27/20 0758 08/27/20 1223  BP: 122/77 125/74  Pulse: 83 85  Resp: 16 16  Temp: 98.3 F (36.8 C) 98.4 F (36.9 C)  SpO2: 100% 100%   Vitals:   08/26/20 2000 08/26/20 2044 08/27/20 0758 08/27/20 1223  BP: 129/76 126/74 122/77 125/74  Pulse: 90 89 83 85  Resp: 15 17 16 16   Temp:  98.4 F (36.9 C) 98.3 F (36.8 C) 98.4 F (36.9 C)  TempSrc:      SpO2: 100% 100% 100% 100%  Weight:      Height:        General: Pt is alert, awake, not in acute distress Cardiovascular: S1/S2 +, no rubs, no gallops Respiratory: CTA bilaterally, no wheezing, no rhonchi Abdominal: Soft, NT, ND, bowel sounds + Extremities: no edema, no cyanosis    The results of significant diagnostics from this hospitalization (including imaging, microbiology, ancillary and laboratory) are listed below for reference.     Microbiology: Recent Results (from the past 240 hour(s))  Resp Panel by RT-PCR (Flu A&B, Covid) Nasopharyngeal Swab     Status: None   Collection Time: 08/24/20 10:30 PM   Specimen: Nasopharyngeal Swab; Nasopharyngeal(NP) swabs in vial transport medium  Result Value Ref Range Status   SARS Coronavirus 2 by RT PCR NEGATIVE NEGATIVE Final    Comment: (NOTE) SARS-CoV-2 target nucleic acids are NOT DETECTED.  The SARS-CoV-2 RNA is generally detectable in upper respiratory specimens during the acute phase of infection. The lowest concentration of SARS-CoV-2 viral copies this assay can detect is 138 copies/mL. A negative result does not preclude SARS-Cov-2 infection  and should not be used as the sole basis for treatment or other patient management decisions. A negative result may occur with  improper specimen collection/handling, submission of specimen other than nasopharyngeal swab, presence of viral mutation(s) within the areas targeted by this assay, and inadequate number of viral copies(<138 copies/mL). A negative result must be combined with clinical observations, patient history, and epidemiological information. The expected result is Negative.  Fact Sheet for Patients:  EntrepreneurPulse.com.au  Fact Sheet for Healthcare Providers:  IncredibleEmployment.be  This test is no t yet approved or cleared by the [place] FDA and  has been authorized for detection and/or diagnosis of SARS-CoV-2 by FDA under an Emergency Use Authorization (EUA). This EUA will remain  in effect (meaning this test can be used) for the duration of the COVID-19 declaration under Section 564(b)(1) of the Act, 21 U.S.C.section 360bbb-3(b)(1), unless the authorization is terminated  or revoked sooner.       Influenza A by PCR  NEGATIVE NEGATIVE Final   Influenza B by PCR NEGATIVE NEGATIVE Final    Comment: (NOTE) The Xpert Xpress SARS-CoV-2/FLU/RSV plus assay is intended as an aid in the diagnosis of influenza from Nasopharyngeal swab specimens and should not be used as a sole basis for treatment. Nasal washings and aspirates are unacceptable for Xpert Xpress SARS-CoV-2/FLU/RSV testing.  Fact Sheet for Patients: EntrepreneurPulse.com.au  Fact Sheet for Healthcare Providers: IncredibleEmployment.be  This test is not yet approved or cleared by the [place] FDA and has been authorized for detection and/or diagnosis of SARS-CoV-2 by FDA under an Emergency Use Authorization (EUA). This EUA will remain in effect (meaning this test can be used) for the duration of the COVID-19 declaration  under Section 564(b)(1) of the Act, 21 U.S.C. section 360bbb-3(b)(1), unless the authorization is terminated or revoked.  Performed at [facility], [address] [place], [place] [address]   Culture, blood (Routine X 2) w Reflex to ID Panel     Status: None (Preliminary result)   Collection Time: 08/26/20  5:37 AM   Specimen: BLOOD  Result Value Ref Range Status   Specimen Description BLOOD RIGHT ANTECUBITAL  Final   Special Requests   Final    BOTTLES DRAWN AEROBIC AND ANAEROBIC Blood Culture results may not be optimal due to an excessive volume of blood received in culture bottles   Culture   Final    NO GROWTH 1 DAY Performed at [facility], [address], [place], [place] [address]    Report Status PENDING  Incomplete  Resp Panel by RT-PCR (Flu A&B, Covid) Nasopharyngeal Swab     Status: None   Collection Time: 08/26/20  5:37 AM   Specimen: Nasopharyngeal Swab; Nasopharyngeal(NP) swabs in vial transport medium  Result Value Ref Range Status   SARS Coronavirus 2 by RT PCR NEGATIVE NEGATIVE Final    Comment: (NOTE) SARS-CoV-2 target nucleic acids are NOT DETECTED.  The SARS-CoV-2 RNA is generally detectable in upper respiratory specimens during the acute phase of infection. The lowest concentration of SARS-CoV-2 viral copies this assay can detect is 138 copies/mL. A negative result does not preclude SARS-Cov-2 infection and should not be used as the sole basis for treatment or other patient management decisions. A negative result may occur with  improper specimen collection/handling, submission of specimen other than nasopharyngeal swab, presence of viral mutation(s) within the areas targeted by this assay, and inadequate number of viral copies(<138 copies/mL). A negative result must be combined with clinical observations, patient history, and epidemiological information. The expected result is Negative.  Fact Sheet for Patients:   EntrepreneurPulse.com.au  Fact Sheet for Healthcare Providers:  IncredibleEmployment.be  This test is no t yet approved or cleared by the [place] FDA and  has been authorized for detection and/or diagnosis of SARS-CoV-2 by FDA under an Emergency Use Authorization (EUA). This EUA will remain  in effect (meaning this test can be used) for the duration of the COVID-19 declaration under Section 564(b)(1) of the Act, 21 U.S.C.section 360bbb-3(b)(1), unless the authorization is terminated  or revoked sooner.       Influenza A by PCR NEGATIVE NEGATIVE Final   Influenza B by PCR NEGATIVE NEGATIVE Final    Comment: (NOTE) The Xpert Xpress SARS-CoV-2/FLU/RSV plus assay is intended as an aid in the diagnosis of influenza from Nasopharyngeal swab specimens and should not be used as a sole basis for treatment. Nasal washings and aspirates are unacceptable for Xpert Xpress SARS-CoV-2/FLU/RSV testing.  Fact Sheet  for Patients: EntrepreneurPulse.com.au  Fact Sheet for Healthcare Providers: IncredibleEmployment.be  This test is not yet approved or cleared by the [place] FDA and has been authorized for detection and/or diagnosis of SARS-CoV-2 by FDA under an Emergency Use Authorization (EUA). This EUA will remain in effect (meaning this test can be used) for the duration of the COVID-19 declaration under Section 564(b)(1) of the Act, 21 U.S.C. section 360bbb-3(b)(1), unless the authorization is terminated or revoked.  Performed at [facility], [address] [place], [place] [address]   MRSA PCR Screening     Status: Abnormal   Collection Time: 08/26/20 12:27 PM   Specimen: Nasopharyngeal  Result Value Ref Range Status   MRSA by PCR POSITIVE (A) NEGATIVE Final    Comment:        The GeneXpert MRSA Assay (FDA approved for NASAL specimens only), is one component of a comprehensive MRSA  colonization surveillance program. It is not intended to diagnose MRSA infection nor to guide or monitor treatment for MRSA infections. RESULT CALLED TO, READ BACK BY AND VERIFIED WITH: JAMIE LOMAX ON 08/26/20 AT 1343 QSD Performed at [facility], [address] [place], [place] [address]   Culture, blood (Routine X 2) w Reflex to ID Panel     Status: None (Preliminary result)   Collection Time: 08/26/20 10:01 PM   Specimen: BLOOD  Result Value Ref Range Status   Specimen Description BLOOD RIGHT HAND  Final   Special Requests   Final    BOTTLES DRAWN AEROBIC AND ANAEROBIC Blood Culture adequate volume   Culture   Final    NO GROWTH < 12 HOURS Performed at [facility], [address] [place], [place] [address]    Report Status PENDING  Incomplete     Labs: BNP (last 3 results) No results for input(s): BNP in the last 8760 hours. Basic Metabolic Panel: Recent Labs  Lab 08/24/20 2246 08/26/20 0511 08/26/20 1700 08/27/20 0525  NA 134* 133* 131* 136  K 3.7 2.9* 3.9 3.5  CL 98 95* 102 105  CO2 23 28 20* 24  GLUCOSE 307* 325* 279* 179*  BUN 22* 22* 15 19  CREATININE 0.63 0.54 0.76 0.63  CALCIUM 9.6 9.3 8.6* 8.8*  MG  --  1.7  --   --    Liver Function Tests: Recent Labs  Lab 08/24/20 2246 08/26/20 0537  AST 19 12*  ALT 9 5  ALKPHOS 75 66  BILITOT 1.1 0.4  PROT 9.0* 8.1  ALBUMIN 3.9 3.4*   Recent Labs  Lab 08/24/20 2246 08/26/20 0757  LIPASE 27 44   No results for input(s): AMMONIA in the last 168 hours. CBC: Recent Labs  Lab 08/24/20 2246 08/26/20 0511 08/27/20 0525  WBC 7.3 7.3 7.3  NEUTROABS 4.0 5.2  --   HGB 11.4* 11.0* 10.1*  HCT 35.0* 33.8* 31.0*  MCV 79.7* 79.7* 80.1  PLT 298 369 358   Cardiac Enzymes: No results for input(s): CKTOTAL, CKMB, CKMBINDEX, TROPONINI in the last 168 hours. BNP: Invalid input(s): POCBNP CBG: Recent Labs  Lab 08/26/20 1612 08/26/20 1831 08/26/20 2047 08/27/20 0827 08/27/20 1149   GLUCAP 305* 264* 211* 208* 126*   D-Dimer No results for input(s): DDIMER in the last 72 hours. Hgb A1c No results for input(s): HGBA1C in the last 72 hours. Lipid Profile No results for input(s): CHOL, HDL, LDLCALC, TRIG, CHOLHDL, LDLDIRECT in the last 72 hours. Thyroid function studies No results for input(s): TSH, T4TOTAL, T3FREE,  THYROIDAB in the last 72 hours.  Invalid input(s): FREET3 Anemia work up No results for input(s): VITAMINB12, FOLATE, FERRITIN, TIBC, IRON, RETICCTPCT in the last 72 hours. Urinalysis    Component Value Date/Time   COLORURINE YELLOW (A) 08/26/2020 0537   APPEARANCEUR CLEAR (A) 08/26/2020 0537   APPEARANCEUR HAZY 06/15/2014 2100   LABSPEC 1.010 08/26/2020 0537   LABSPEC 1.018 06/15/2014 2100   PHURINE 6.0 08/26/2020 0537   GLUCOSEU >=500 (A) 08/26/2020 0537   GLUCOSEU >=500 mg/dL 06/15/2014 2100   HGBUR LARGE (A) 08/26/2020 0537   BILIRUBINUR NEGATIVE 08/26/2020 0537   BILIRUBINUR NEGATIVE 06/15/2014 2100   KETONESUR NEGATIVE 08/26/2020 0537   PROTEINUR 30 (A) 08/26/2020 0537   UROBILINOGEN 1.0 02/09/2008 1519   NITRITE NEGATIVE 08/26/2020 0537   LEUKOCYTESUR NEGATIVE 08/26/2020 0537   LEUKOCYTESUR TRACE 06/15/2014 2100   Sepsis Labs Invalid input(s): PROCALCITONIN,  WBC,  LACTICIDVEN Microbiology Recent Results (from the past 240 hour(s))  Resp Panel by RT-PCR (Flu A&B, Covid) Nasopharyngeal Swab     Status: None   Collection Time: 08/24/20 10:30 PM   Specimen: Nasopharyngeal Swab; Nasopharyngeal(NP) swabs in vial transport medium  Result Value Ref Range Status   SARS Coronavirus 2 by RT PCR NEGATIVE NEGATIVE Final    Comment: (NOTE) SARS-CoV-2 target nucleic acids are NOT DETECTED.  The SARS-CoV-2 RNA is generally detectable in upper respiratory specimens during the acute phase of infection. The lowest concentration of SARS-CoV-2 viral copies this assay can detect is 138 copies/mL. A negative result does not preclude  SARS-Cov-2 infection and should not be used as the sole basis for treatment or other patient management decisions. A negative result may occur with  improper specimen collection/handling, submission of specimen other than nasopharyngeal swab, presence of viral mutation(s) within the areas targeted by this assay, and inadequate number of viral copies(<138 copies/mL). A negative result must be combined with clinical observations, patient history, and epidemiological information. The expected result is Negative.  Fact Sheet for Patients:  EntrepreneurPulse.com.au  Fact Sheet for Healthcare Providers:  IncredibleEmployment.be  This test is no t yet approved or cleared by the [place] FDA and  has been authorized for detection and/or diagnosis of SARS-CoV-2 by FDA under an Emergency Use Authorization (EUA). This EUA will remain  in effect (meaning this test can be used) for the duration of the COVID-19 declaration under Section 564(b)(1) of the Act, 21 U.S.C.section 360bbb-3(b)(1), unless the authorization is terminated  or revoked sooner.       Influenza A by PCR NEGATIVE NEGATIVE Final   Influenza B by PCR NEGATIVE NEGATIVE Final    Comment: (NOTE) The Xpert Xpress SARS-CoV-2/FLU/RSV plus assay is intended as an aid in the diagnosis of influenza from Nasopharyngeal swab specimens and should not be used as a sole basis for treatment. Nasal washings and aspirates are unacceptable for Xpert Xpress SARS-CoV-2/FLU/RSV testing.  Fact Sheet for Patients: EntrepreneurPulse.com.au  Fact Sheet for Healthcare Providers: IncredibleEmployment.be  This test is not yet approved or cleared by the [place] FDA and has been authorized for detection and/or diagnosis of SARS-CoV-2 by FDA under an Emergency Use Authorization (EUA). This EUA will remain in effect (meaning this test can be used) for the duration of  the COVID-19 declaration under Section 564(b)(1) of the Act, 21 U.S.C. section 360bbb-3(b)(1), unless the authorization is terminated or revoked.  Performed at [facility], [address] [place], [place] [address]   Culture, blood (Routine X 2) w Reflex to ID Panel  Status: None (Preliminary result)   Collection Time: 08/26/20  5:37 AM   Specimen: BLOOD  Result Value Ref Range Status   Specimen Description BLOOD RIGHT ANTECUBITAL  Final   Special Requests   Final    BOTTLES DRAWN AEROBIC AND ANAEROBIC Blood Culture results may not be optimal due to an excessive volume of blood received in culture bottles   Culture   Final    NO GROWTH 1 DAY Performed at [facility], [address], [place], [place] [address]    Report Status PENDING  Incomplete  Resp Panel by RT-PCR (Flu A&B, Covid) Nasopharyngeal Swab     Status: None   Collection Time: 08/26/20  5:37 AM   Specimen: Nasopharyngeal Swab; Nasopharyngeal(NP) swabs in vial transport medium  Result Value Ref Range Status   SARS Coronavirus 2 by RT PCR NEGATIVE NEGATIVE Final    Comment: (NOTE) SARS-CoV-2 target nucleic acids are NOT DETECTED.  The SARS-CoV-2 RNA is generally detectable in upper respiratory specimens during the acute phase of infection. The lowest concentration of SARS-CoV-2 viral copies this assay can detect is 138 copies/mL. A negative result does not preclude SARS-Cov-2 infection and should not be used as the sole basis for treatment or other patient management decisions. A negative result may occur with  improper specimen collection/handling, submission of specimen other than nasopharyngeal swab, presence of viral mutation(s) within the areas targeted by this assay, and inadequate number of viral copies(<138 copies/mL). A negative result must be combined with clinical observations, patient history, and epidemiological information. The expected result is Negative.  Fact Sheet for  Patients:  EntrepreneurPulse.com.au  Fact Sheet for Healthcare Providers:  IncredibleEmployment.be  This test is no t yet approved or cleared by the [place] FDA and  has been authorized for detection and/or diagnosis of SARS-CoV-2 by FDA under an Emergency Use Authorization (EUA). This EUA will remain  in effect (meaning this test can be used) for the duration of the COVID-19 declaration under Section 564(b)(1) of the Act, 21 U.S.C.section 360bbb-3(b)(1), unless the authorization is terminated  or revoked sooner.       Influenza A by PCR NEGATIVE NEGATIVE Final   Influenza B by PCR NEGATIVE NEGATIVE Final    Comment: (NOTE) The Xpert Xpress SARS-CoV-2/FLU/RSV plus assay is intended as an aid in the diagnosis of influenza from Nasopharyngeal swab specimens and should not be used as a sole basis for treatment. Nasal washings and aspirates are unacceptable for Xpert Xpress SARS-CoV-2/FLU/RSV testing.  Fact Sheet for Patients: EntrepreneurPulse.com.au  Fact Sheet for Healthcare Providers: IncredibleEmployment.be  This test is not yet approved or cleared by the [place] FDA and has been authorized for detection and/or diagnosis of SARS-CoV-2 by FDA under an Emergency Use Authorization (EUA). This EUA will remain in effect (meaning this test can be used) for the duration of the COVID-19 declaration under Section 564(b)(1) of the Act, 21 U.S.C. section 360bbb-3(b)(1), unless the authorization is terminated or revoked.  Performed at [facility], [address] [place],  [address]   MRSA PCR Screening     Status: Abnormal   Collection Time: 08/26/20 12:27 PM   Specimen: Nasopharyngeal  Result Value Ref Range Status   MRSA by PCR POSITIVE (A) NEGATIVE Final    Comment:        The GeneXpert MRSA Assay (FDA approved for NASAL specimens only), is one component of a comprehensive  MRSA colonization surveillance program. It is not intended to diagnose MRSA infection nor to  guide or monitor treatment for MRSA infections. RESULT CALLED TO, READ BACK BY AND VERIFIED WITH: JAMIE LOMAX ON 08/26/20 AT 1343 QSD Performed at [facility], [address] [place], [place] [address]   Culture, blood (Routine X 2) w Reflex to ID Panel     Status: None (Preliminary result)   Collection Time: 08/26/20 10:01 PM   Specimen: BLOOD  Result Value Ref Range Status   Specimen Description BLOOD RIGHT HAND  Final   Special Requests   Final    BOTTLES DRAWN AEROBIC AND ANAEROBIC Blood Culture adequate volume   Culture   Final    NO GROWTH < 12 HOURS Performed at [facility], [address], [place], [place] [address]    Report Status PENDING  Incomplete     Time coordinating discharge: Over 30 minutes  SIGNED:   Wyvonnia Dusky, MD  [facility] 08/27/2020, 3:28 PM Pager   If 7PM-7AM, please contact night-coverage

## 2020-08-27 NOTE — Progress Notes (Signed)
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  No source of infection identified. Sepsis r/o. Continue to monitor BS ACHS. Resolved   DM2: poorly controlled w/ HbA1c 13.9. Continue on SSI w/ accuchecks   Hypothermia: most likely secondary to hypoglycemia. Resolved  Hypokalemia: WNL today   Nausea & vomiting and abdominal pain: possibly due to gastroparesis. Zofran prn. Continue on PPI. Started on reglan. CT abd/pelvis showed thickening in the distal esophagus, esophagitis would be a common cause   Hx of narcotic abuse: as per previous hospitalist.   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 Seizure precaution  Lupus: not taking medications currently    DVT prophylaxis: lovenox  Code Status:  Full  Family Communication: Disposition Plan: likely d/c back home   Level of care: Med-Surg   Status is: Inpatient  Remains inpatient appropriate because:IV treatments appropriate due to intensity of illness or inability to take PO and Inpatient level of care appropriate due to severity of illness   Dispo: The patient is from: Home              Anticipated d/c is to: Home              Patient currently is not medically stable to d/c.   Difficult to place patient: unclear      Consultants:      Procedures:     Antimicrobials:    Subjective: Pt c/o abd pain   Objective: Vitals:   08/26/20 1804 08/26/20 1900 08/26/20 2000 08/26/20 2044  BP: 121/81 120/64 129/76 126/74  Pulse: 98 88 90 89  Resp: 18 17 15 17   Temp:    98.4 F (36.9 C)  TempSrc:      SpO2: 100% 100% 100% 100%  Weight:      Height:        Intake/Output Summary (Last 24 hours) at 08/27/2020 0724 Last data filed at 08/26/2020 1800 Gross per 24 hour  Intake 2670.74 ml  Output --  Net 2670.74 ml   Filed Weights   08/26/20 0516  Weight: 68 kg    Examination:  General exam: Appears uncomfortable and restless  Respiratory system: Clear to auscultation. Respiratory effort normal. Cardiovascular system: S1 & S2 +. No rubs, gallops or clicks.  Gastrointestinal system: Abdomen is nondistended, soft and tender to palpation.  Normal bowel sounds heard. Central nervous system: Alert and oriented. Moves all extremities Psychiatry: Judgement and insight appear normal. Appears agitated & frustrated.     Data Reviewed: I have personally reviewed following labs and imaging studies  CBC: Recent Labs  Lab 08/24/20 2246 08/26/20 0511 08/27/20 0525  WBC 7.3 7.3 7.3  NEUTROABS 4.0 5.2  --   HGB 11.4* 11.0* 10.1*  HCT 35.0* 33.8* 31.0*  MCV 79.7* 79.7* 80.1  PLT 298 369 358  Basic Metabolic Panel: Recent Labs  Lab 08/24/20 2246 08/26/20 0511 08/26/20 1700 08/27/20 0525  NA 134* 133* 131* 136  K 3.7 2.9* 3.9 3.5  CL 98 95* 102 105  CO2 23 28 20* 24  GLUCOSE 307* 325* 279* 179*  BUN 22* 22* 15 19  CREATININE 0.63 0.54 0.76 0.63  CALCIUM 9.6 9.3 8.6* 8.8*  MG  --  1.7  --   --    GFR: Estimated Creatinine Clearance: 90.3 mL/min (by C-G formula based on SCr of 0.63 mg/dL). Liver Function Tests: Recent Labs  Lab 08/24/20 2246 08/26/20 0537  AST 19 12*  ALT 9 5  ALKPHOS 75 66  BILITOT 1.1 0.4  PROT 9.0* 8.1  ALBUMIN 3.9 3.4*   Recent Labs  Lab 08/24/20 2246 08/26/20 0757  LIPASE 27 44   No  results for input(s): AMMONIA in the last 168 hours. Coagulation Profile: No results for input(s): INR, PROTIME in the last 168 hours. Cardiac Enzymes: No results for input(s): CKTOTAL, CKMB, CKMBINDEX, TROPONINI in the last 168 hours. BNP (last 3 results) No results for input(s): PROBNP in the last 8760 hours. HbA1C: No results for input(s): HGBA1C in the last 72 hours. CBG: Recent Labs  Lab 08/26/20 1228 08/26/20 1349 08/26/20 1612 08/26/20 1831 08/26/20 2047  GLUCAP 127* 184* 305* 264* 211*   Lipid Profile: No results for input(s): CHOL, HDL, LDLCALC, TRIG, CHOLHDL, LDLDIRECT in the last 72 hours. Thyroid Function Tests: No results for input(s): TSH, T4TOTAL, FREET4, T3FREE, THYROIDAB in the last 72 hours. Anemia Panel: No results for input(s): VITAMINB12, FOLATE, FERRITIN, TIBC, IRON, RETICCTPCT in the last 72 hours. Sepsis Labs: Recent Labs  Lab 08/26/20 0511 08/26/20 0537  PROCALCITON 0.16  --   LATICACIDVEN  --  2.0*    Recent Results (from the past 240 hour(s))  Resp Panel by RT-PCR (Flu A&B, Covid) Nasopharyngeal Swab     Status: None   Collection Time: 08/24/20 10:30 PM   Specimen: Nasopharyngeal Swab; Nasopharyngeal(NP) swabs in vial transport medium  Result Value Ref Range Status   SARS Coronavirus 2 by RT PCR NEGATIVE NEGATIVE Final    Comment: (NOTE) SARS-CoV-2 target nucleic acids are NOT DETECTED.  The SARS-CoV-2 RNA is generally detectable in upper respiratory specimens during the acute phase of infection. The lowest concentration of SARS-CoV-2 viral copies this assay can detect is 138 copies/mL. A negative result does not preclude SARS-Cov-2 infection and should not be used as the sole basis for treatment or other patient management decisions. A negative result may occur with  improper specimen collection/handling, submission of specimen other than nasopharyngeal swab, presence of viral mutation(s) within the areas targeted by this assay, and  inadequate number of viral copies(<138 copies/mL). A negative result must be combined with clinical observations, patient history, and epidemiological information. The expected result is Negative.  Fact Sheet for Patients:  EntrepreneurPulse.com.au  Fact Sheet for Healthcare Providers:  IncredibleEmployment.be  This test is no t yet approved or cleared by the [place] FDA and  has been authorized for detection and/or diagnosis of SARS-CoV-2 by FDA under an Emergency Use Authorization (EUA). This EUA will remain  in effect (meaning this test can be used) for the duration of the COVID-19 declaration under Section 564(b)(1) of the Act, 21 U.S.C.section 360bbb-3(b)(1), unless the authorization is terminated  or revoked sooner.       Influenza A by PCR NEGATIVE NEGATIVE Final   Influenza B by PCR NEGATIVE NEGATIVE Final    Comment: (  NOTE) The Xpert Xpress SARS-CoV-2/FLU/RSV plus assay is intended as an aid in the diagnosis of influenza from Nasopharyngeal swab specimens and should not be used as a sole basis for treatment. Nasal washings and aspirates are unacceptable for Xpert Xpress SARS-CoV-2/FLU/RSV testing.  Fact Sheet for Patients: EntrepreneurPulse.com.au  Fact Sheet for Healthcare Providers: IncredibleEmployment.be  This test is not yet approved or cleared by the [place] FDA and has been authorized for detection and/or diagnosis of SARS-CoV-2 by FDA under an Emergency Use Authorization (EUA). This EUA will remain in effect (meaning this test can be used) for the duration of the COVID-19 declaration under Section 564(b)(1) of the Act, 21 U.S.C. section 360bbb-3(b)(1), unless the authorization is terminated or revoked.  Performed at [facility], [address] [place], [place] [address]   Culture, blood (Routine X 2) w Reflex to ID Panel     Status: None (Preliminary result)    Collection Time: 08/26/20  5:37 AM   Specimen: BLOOD  Result Value Ref Range Status   Specimen Description BLOOD RIGHT ANTECUBITAL  Final   Special Requests   Final    BOTTLES DRAWN AEROBIC AND ANAEROBIC Blood Culture results may not be optimal due to an excessive volume of blood received in culture bottles   Culture   Final    NO GROWTH < 12 HOURS Performed at [facility], [address], [place], [place] [address]    Report Status PENDING  Incomplete  Resp Panel by RT-PCR (Flu A&B, Covid) Nasopharyngeal Swab     Status: None   Collection Time: 08/26/20  5:37 AM   Specimen: Nasopharyngeal Swab; Nasopharyngeal(NP) swabs in vial transport medium  Result Value Ref Range Status   SARS Coronavirus 2 by RT PCR NEGATIVE NEGATIVE Final    Comment: (NOTE) SARS-CoV-2 target nucleic acids are NOT DETECTED.  The SARS-CoV-2 RNA is generally detectable in upper respiratory specimens during the acute phase of infection. The lowest concentration of SARS-CoV-2 viral copies this assay can detect is 138 copies/mL. A negative result does not preclude SARS-Cov-2 infection and should not be used as the sole basis for treatment or other patient management decisions. A negative result may occur with  improper specimen collection/handling, submission of specimen other than nasopharyngeal swab, presence of viral mutation(s) within the areas targeted by this assay, and inadequate number of viral copies(<138 copies/mL). A negative result must be combined with clinical observations, patient history, and epidemiological information. The expected result is Negative.  Fact Sheet for Patients:  EntrepreneurPulse.com.au  Fact Sheet for Healthcare Providers:  IncredibleEmployment.be  This test is no t yet approved or cleared by the [place] FDA and  has been authorized for detection and/or diagnosis of SARS-CoV-2 by FDA under an Emergency Use Authorization  (EUA). This EUA will remain  in effect (meaning this test can be used) for the duration of the COVID-19 declaration under Section 564(b)(1) of the Act, 21 U.S.C.section 360bbb-3(b)(1), unless the authorization is terminated  or revoked sooner.       Influenza A by PCR NEGATIVE NEGATIVE Final   Influenza B by PCR NEGATIVE NEGATIVE Final    Comment: (NOTE) The Xpert Xpress SARS-CoV-2/FLU/RSV plus assay is intended as an aid in the diagnosis of influenza from Nasopharyngeal swab specimens and should not be used as a sole basis for treatment. Nasal washings and aspirates are unacceptable for Xpert Xpress SARS-CoV-2/FLU/RSV testing.  Fact Sheet for Patients: EntrepreneurPulse.com.au  Fact Sheet for Healthcare Providers: IncredibleEmployment.be  This test is not  yet approved or cleared by the [place] and has been authorized for detection and/or diagnosis of SARS-CoV-2 by FDA under an Emergency Use Authorization (EUA). This EUA will remain in effect (meaning this test can be used) for the duration of the COVID-19 declaration under Section 564(b)(1) of the Act, 21 U.S.C. section 360bbb-3(b)(1), unless the authorization is terminated or revoked.  Performed at [facility], [address] [place], [place] [address]   MRSA PCR Screening     Status: Abnormal   Collection Time: 08/26/20 12:27 PM   Specimen: Nasopharyngeal  Result Value Ref Range Status   MRSA by PCR POSITIVE (A) NEGATIVE Final    Comment:        The GeneXpert MRSA Assay (FDA approved for NASAL specimens only), is one component of a comprehensive MRSA colonization surveillance program. It is not intended to diagnose MRSA infection nor to guide or monitor treatment for MRSA infections. RESULT CALLED TO, READ BACK BY AND VERIFIED WITH: JAMIE LOMAX ON 08/26/20 AT 1343 QSD Performed at [facility], [address], [place], [place] [address]           Radiology Studies: CT ABDOMEN PELVIS WO CONTRAST  Result Date: 08/26/2020 CLINICAL DATA:  Abdominal pain.  Hypoglycemia.  Diabetes. EXAM: CT ABDOMEN AND PELVIS WITHOUT CONTRAST TECHNIQUE: Multidetector CT imaging of the abdomen and pelvis was performed following the standard protocol without IV contrast. COMPARISON:  Multiple exams, including 06/26/2020 FINDINGS: Lower chest: Continue nonspecific wall thickening in the distal esophagus; esophagitis would be a common cause. Hepatobiliary: Cholecystectomy.  Otherwise unremarkable Pancreas: Unremarkable Spleen: Unremarkable Adrenals/Urinary Tract: 1.5 by 1.2 cm left adrenal mass, internal density 9 Hounsfield units, favoring adenoma. No hydronephrosis or hydroureter. Vascular calcification versus 2 mm nonobstructive left renal calculus on image 53 series 5, adjacent 1 mm left mid kidney calcification on image 51 series 5. Stomach/Bowel: Unremarkable Vascular/Lymphatic: Aortoiliac atherosclerotic vascular disease. Small bilateral pelvic lymph nodes are not pathologically enlarged by size criteria. Reproductive: Unremarkable Other: No supplemental non-categorized findings. Musculoskeletal: Unremarkable IMPRESSION: 1. Punctate calcifications in the left mid kidney are probably from vascular calcifications and less likely to be from tiny nonobstructive renal calculi. 2. Continue nonspecific wall thickening in the distal esophagus, esophagitis would be a common cause. 3.  Aortic Atherosclerosis (ICD10-I70.0). 4. Small left adrenal adenoma. Electronically Signed   By: Van Clines M.D.   On: 08/26/2020 12:29   DG Chest Portable 1 View  Result Date: 08/26/2020 CLINICAL DATA:  40 year old female with hyperglycemia, hypothermia. EXAM: PORTABLE CHEST 1 VIEW COMPARISON:  Portable chest 06/26/2020 and earlier. FINDINGS: Portable AP upright view at 0532 hours. Normal lung volumes and mediastinal contours. Visualized tracheal air column is within normal  limits. Allowing for portable technique the lungs are clear. No pneumothorax or pleural effusion. Negative visible bowel gas and osseous structures. IMPRESSION: Negative portable chest. Electronically Signed   By: Genevie Ann M.D.   On: 08/26/2020 05:57        Scheduled Meds: . Chlorhexidine Gluconate Cloth  6 each Topical Q2200  . enoxaparin (LOVENOX) injection  40 mg Subcutaneous Q24H  . insulin aspart  0-5 Units Subcutaneous QHS  . insulin aspart  0-9 Units Subcutaneous TID WC  . mupirocin ointment  1 application Nasal BID  . nicotine  21 mg Transdermal Daily  . pantoprazole (PROTONIX) IV  40 mg Intravenous Q12H  . potassium chloride  40 mEq Oral Once   Continuous Infusions:   LOS: 0 days  Time spent: 30 mins     Wyvonnia Dusky, MD [facility] Pager [phone]  If 7PM-7AM, please contact night-coverage  08/27/2020, 7:24 AM

## 2020-08-27 NOTE — Progress Notes (Signed)
Patient is upset and doesn't think her pain medication is working. Daughter at bedside. Patient tearful, upset, and anxious, stating she isnt getting the right treatment and thinks its because of the color of her skin. I discussed with patient in terms of plan of care. Dr Jimmye Norman is aware, and is going to discuss with pharmacy to see if there are any other options other than fentanyl, or toradol. Malachy Mood [facility] is aware and is to come talk to patient in regards to situation and patient concerns.

## 2020-08-28 LAB — URINE CULTURE

## 2020-08-31 LAB — CULTURE, BLOOD (ROUTINE X 2)
Culture: NO GROWTH
Culture: NO GROWTH
Special Requests: ADEQUATE

## 2020-11-06 ENCOUNTER — Other Ambulatory Visit: Payer: Self-pay

## 2020-11-06 ENCOUNTER — Encounter: Payer: Self-pay | Admitting: *Deleted

## 2020-11-06 DIAGNOSIS — E876 Hypokalemia: Secondary | ICD-10-CM | POA: Diagnosis not present

## 2020-11-06 DIAGNOSIS — E1110 Type 2 diabetes mellitus with ketoacidosis without coma: Secondary | ICD-10-CM | POA: Diagnosis not present

## 2020-11-06 DIAGNOSIS — N390 Urinary tract infection, site not specified: Secondary | ICD-10-CM | POA: Diagnosis not present

## 2020-11-06 DIAGNOSIS — N3001 Acute cystitis with hematuria: Secondary | ICD-10-CM | POA: Diagnosis not present

## 2020-11-06 DIAGNOSIS — M545 Low back pain, unspecified: Secondary | ICD-10-CM | POA: Insufficient documentation

## 2020-11-06 DIAGNOSIS — F17210 Nicotine dependence, cigarettes, uncomplicated: Secondary | ICD-10-CM | POA: Insufficient documentation

## 2020-11-06 DIAGNOSIS — K2970 Gastritis, unspecified, without bleeding: Secondary | ICD-10-CM | POA: Diagnosis not present

## 2020-11-06 DIAGNOSIS — R1084 Generalized abdominal pain: Secondary | ICD-10-CM | POA: Diagnosis not present

## 2020-11-06 DIAGNOSIS — B9689 Other specified bacterial agents as the cause of diseases classified elsewhere: Secondary | ICD-10-CM | POA: Insufficient documentation

## 2020-11-06 DIAGNOSIS — N939 Abnormal uterine and vaginal bleeding, unspecified: Secondary | ICD-10-CM | POA: Diagnosis not present

## 2020-11-06 DIAGNOSIS — Z794 Long term (current) use of insulin: Secondary | ICD-10-CM | POA: Diagnosis not present

## 2020-11-06 DIAGNOSIS — K2900 Acute gastritis without bleeding: Secondary | ICD-10-CM | POA: Diagnosis not present

## 2020-11-06 DIAGNOSIS — R109 Unspecified abdominal pain: Secondary | ICD-10-CM | POA: Diagnosis not present

## 2020-11-06 DIAGNOSIS — Z5321 Procedure and treatment not carried out due to patient leaving prior to being seen by health care provider: Secondary | ICD-10-CM | POA: Insufficient documentation

## 2020-11-06 LAB — CBC
HCT: 38.1 % (ref 36.0–46.0)
Hemoglobin: 12.9 g/dL (ref 12.0–15.0)
MCH: 27.7 pg (ref 26.0–34.0)
MCHC: 33.9 g/dL (ref 30.0–36.0)
MCV: 81.9 fL (ref 80.0–100.0)
Platelets: 348 10*3/uL (ref 150–400)
RBC: 4.65 MIL/uL (ref 3.87–5.11)
RDW: 14.6 % (ref 11.5–15.5)
WBC: 9 10*3/uL (ref 4.0–10.5)
nRBC: 0 % (ref 0.0–0.2)

## 2020-11-06 LAB — URINALYSIS, COMPLETE (UACMP) WITH MICROSCOPIC
Bacteria, UA: NONE SEEN
Bilirubin Urine: NEGATIVE
Glucose, UA: >=500 mg/dL — AB
Ketones, ur: NEGATIVE mg/dL
Nitrite: POSITIVE — AB
Protein, ur: 30 mg/dL — AB
Specific Gravity, Urine: 1.023 (ref 1.005–1.030)
WBC, UA: >50 WBC/hpf — ABNORMAL HIGH (ref 0–5)
pH: 7 (ref 5.0–8.0)

## 2020-11-06 LAB — COMPREHENSIVE METABOLIC PANEL
ALT: 8 U/L (ref 0–44)
AST: 13 U/L — ABNORMAL LOW (ref 15–41)
Albumin: 3.9 g/dL (ref 3.5–5.0)
Alkaline Phosphatase: 45 U/L (ref 38–126)
Anion gap: 9 (ref 5–15)
BUN: 13 mg/dL (ref 6–20)
CO2: 31 mmol/L (ref 22–32)
Calcium: 9.7 mg/dL (ref 8.9–10.3)
Chloride: 92 mmol/L — ABNORMAL LOW (ref 98–111)
Creatinine, Ser: 0.67 mg/dL (ref 0.44–1.00)
GFR, Estimated: >60 mL/min (ref >60)
Glucose, Bld: 308 mg/dL — ABNORMAL HIGH (ref 70–99)
Potassium: 2.7 mmol/L — CL (ref 3.5–5.1)
Sodium: 132 mmol/L — ABNORMAL LOW (ref 135–145)
Total Bilirubin: 0.5 mg/dL (ref 0.3–1.2)
Total Protein: 8.6 g/dL — ABNORMAL HIGH (ref 6.5–8.1)

## 2020-11-06 LAB — POC URINE PREG, ED: Preg Test, Ur: NEGATIVE

## 2020-11-06 LAB — LIPASE, BLOOD: Lipase: 32 U/L (ref 11–51)

## 2020-11-06 NOTE — ED Triage Notes (Signed)
Pt reports abd pain and lower back pain for 4 days.  No n/v/d.  Pt reports vaginal spotting yesterday.  Pt denies urinary sx.  Pt alert speech clear.

## 2020-11-07 ENCOUNTER — Emergency Department
Admission: EM | Admit: 2020-11-07 | Discharge: 2020-11-07 | Disposition: A | Discharge status: Home / Self Care | Payer: Medicaid Other | Source: Home / Self Care | Attending: Emergency Medicine | Admitting: Emergency Medicine

## 2020-11-07 ENCOUNTER — Emergency Department: Payer: Medicaid Other

## 2020-11-07 ENCOUNTER — Encounter: Payer: Self-pay | Admitting: Emergency Medicine

## 2020-11-07 ENCOUNTER — Emergency Department
Admission: EM | Admit: 2020-11-07 | Discharge: 2020-11-07 | Disposition: A | Discharge status: Home / Self Care | Payer: Medicaid Other | Attending: Emergency Medicine | Admitting: Emergency Medicine

## 2020-11-07 ENCOUNTER — Other Ambulatory Visit: Payer: Self-pay

## 2020-11-07 DIAGNOSIS — Z794 Long term (current) use of insulin: Secondary | ICD-10-CM | POA: Insufficient documentation

## 2020-11-07 DIAGNOSIS — F17210 Nicotine dependence, cigarettes, uncomplicated: Secondary | ICD-10-CM | POA: Insufficient documentation

## 2020-11-07 DIAGNOSIS — K2970 Gastritis, unspecified, without bleeding: Secondary | ICD-10-CM | POA: Insufficient documentation

## 2020-11-07 DIAGNOSIS — R1084 Generalized abdominal pain: Secondary | ICD-10-CM | POA: Diagnosis not present

## 2020-11-07 DIAGNOSIS — E1110 Type 2 diabetes mellitus with ketoacidosis without coma: Secondary | ICD-10-CM | POA: Insufficient documentation

## 2020-11-07 DIAGNOSIS — K2900 Acute gastritis without bleeding: Secondary | ICD-10-CM

## 2020-11-07 DIAGNOSIS — N390 Urinary tract infection, site not specified: Secondary | ICD-10-CM | POA: Diagnosis not present

## 2020-11-07 DIAGNOSIS — B9689 Other specified bacterial agents as the cause of diseases classified elsewhere: Secondary | ICD-10-CM | POA: Insufficient documentation

## 2020-11-07 DIAGNOSIS — R109 Unspecified abdominal pain: Secondary | ICD-10-CM | POA: Diagnosis not present

## 2020-11-07 DIAGNOSIS — N3001 Acute cystitis with hematuria: Secondary | ICD-10-CM

## 2020-11-07 DIAGNOSIS — E876 Hypokalemia: Secondary | ICD-10-CM

## 2020-11-07 LAB — LIPASE, BLOOD: Lipase: 30 U/L (ref 11–51)

## 2020-11-07 LAB — CBC WITH DIFFERENTIAL/PLATELET
Abs Immature Granulocytes: 0.03 10*3/uL (ref 0.00–0.07)
Basophils Absolute: 0.1 10*3/uL (ref 0.0–0.1)
Basophils Relative: 1 %
Eosinophils Absolute: 0.2 10*3/uL (ref 0.0–0.5)
Eosinophils Relative: 3 %
HCT: 37 % (ref 36.0–46.0)
Hemoglobin: 12.1 g/dL (ref 12.0–15.0)
Immature Granulocytes: 0 %
Lymphocytes Relative: 30 %
Lymphs Abs: 2.4 10*3/uL (ref 0.7–4.0)
MCH: 27.1 pg (ref 26.0–34.0)
MCHC: 32.7 g/dL (ref 30.0–36.0)
MCV: 82.8 fL (ref 80.0–100.0)
Monocytes Absolute: 0.6 10*3/uL (ref 0.1–1.0)
Monocytes Relative: 7 %
Neutro Abs: 4.8 10*3/uL (ref 1.7–7.7)
Neutrophils Relative %: 59 %
Platelets: 309 10*3/uL (ref 150–400)
RBC: 4.47 MIL/uL (ref 3.87–5.11)
RDW: 14.7 % (ref 11.5–15.5)
WBC: 8.1 10*3/uL (ref 4.0–10.5)
nRBC: 0 % (ref 0.0–0.2)

## 2020-11-07 LAB — COMPREHENSIVE METABOLIC PANEL
ALT: 5 U/L (ref 0–44)
AST: 13 U/L — ABNORMAL LOW (ref 15–41)
Albumin: 3.7 g/dL (ref 3.5–5.0)
Alkaline Phosphatase: 42 U/L (ref 38–126)
Anion gap: 16 — ABNORMAL HIGH (ref 5–15)
BUN: 12 mg/dL (ref 6–20)
CO2: 26 mmol/L (ref 22–32)
Calcium: 9.6 mg/dL (ref 8.9–10.3)
Chloride: 92 mmol/L — ABNORMAL LOW (ref 98–111)
Creatinine, Ser: 0.61 mg/dL (ref 0.44–1.00)
GFR, Estimated: >60 mL/min (ref >60)
Glucose, Bld: 308 mg/dL — ABNORMAL HIGH (ref 70–99)
Potassium: 3.5 mmol/L (ref 3.5–5.1)
Sodium: 134 mmol/L — ABNORMAL LOW (ref 135–145)
Total Bilirubin: 0.8 mg/dL (ref 0.3–1.2)
Total Protein: 8.4 g/dL — ABNORMAL HIGH (ref 6.5–8.1)

## 2020-11-07 LAB — URINALYSIS, COMPLETE (UACMP) WITH MICROSCOPIC
Bacteria, UA: NONE SEEN
Bilirubin Urine: NEGATIVE
Glucose, UA: >=500 mg/dL — AB
Ketones, ur: NEGATIVE mg/dL
Nitrite: POSITIVE — AB
Protein, ur: 30 mg/dL — AB
Specific Gravity, Urine: >1.046 — ABNORMAL HIGH (ref 1.005–1.030)
pH: 7 (ref 5.0–8.0)

## 2020-11-07 LAB — BLOOD GAS, VENOUS
Acid-Base Excess: 8.2 mmol/L — ABNORMAL HIGH (ref 0.0–2.0)
Bicarbonate: 35.3 mmol/L — ABNORMAL HIGH (ref 20.0–28.0)
O2 Saturation: 40.4 %
Patient temperature: 37
pCO2, Ven: 61 mmHg — ABNORMAL HIGH (ref 44.0–60.0)
pH, Ven: 7.37 (ref 7.250–7.430)
pO2, Ven: <31 mmHg — CL (ref 32.0–45.0)

## 2020-11-07 LAB — MAGNESIUM
Magnesium: 1.6 mg/dL — ABNORMAL LOW (ref 1.7–2.4)
Magnesium: 1.6 mg/dL — ABNORMAL LOW (ref 1.7–2.4)

## 2020-11-07 LAB — TROPONIN I (HIGH SENSITIVITY)
Troponin I (High Sensitivity): 4 ng/L (ref <18)
Troponin I (High Sensitivity): 4 ng/L (ref <18)

## 2020-11-07 LAB — POC URINE PREG, ED: Preg Test, Ur: NEGATIVE

## 2020-11-07 MED ORDER — POTASSIUM CHLORIDE 10 MEQ/100ML IV SOLN
10.0000 meq | INTRAVENOUS | Status: DC
  Filled 2020-11-07 (×3): qty 100

## 2020-11-07 MED ORDER — CEPHALEXIN 500 MG PO CAPS: 500.0000 mg | ORAL_CAPSULE | Freq: Three times a day (TID) | ORAL | 0 refills | Status: DC

## 2020-11-07 MED ORDER — IOHEXOL 350 MG/ML SOLN
80.0000 mL | Freq: Once | INTRAVENOUS | Status: AC | PRN
  Administered 2020-11-07: 80 mL via INTRAVENOUS

## 2020-11-07 MED ORDER — MAGNESIUM SULFATE 4 GM/100ML IV SOLN
4.0000 g | Freq: Once | INTRAVENOUS | Status: DC
  Filled 2020-11-07: qty 100

## 2020-11-07 MED ORDER — PANTOPRAZOLE SODIUM 40 MG PO TBEC: 40.0000 mg | DELAYED_RELEASE_TABLET | Freq: Every day | ORAL | 1 refills | Status: DC

## 2020-11-07 MED ORDER — SODIUM CHLORIDE 0.9 % IV BOLUS
1000.0000 mL | Freq: Once | INTRAVENOUS | Status: AC
  Administered 2020-11-07: 1000 mL via INTRAVENOUS

## 2020-11-07 MED ORDER — CEFTRIAXONE SODIUM 1 G IJ SOLR: 1.0000 g | Freq: Once | INTRAMUSCULAR | 0 refills | Status: AC

## 2020-11-07 MED ORDER — INSULIN ASPART 100 UNIT/ML IJ SOLN: 0.0000 [IU] | INTRAMUSCULAR | Status: DC

## 2020-11-07 MED ORDER — ONDANSETRON HCL 4 MG/2ML IJ SOLN
4.0000 mg | Freq: Once | INTRAMUSCULAR | Status: AC
  Administered 2020-11-07: 4 mg via INTRAVENOUS
  Filled 2020-11-07: qty 2

## 2020-11-07 MED ORDER — HYDROMORPHONE HCL 1 MG/ML IJ SOLN
1.0000 mg | Freq: Once | INTRAMUSCULAR | Status: AC
  Administered 2020-11-07: 1 mg via INTRAVENOUS
  Filled 2020-11-07: qty 1

## 2020-11-07 MED ORDER — POTASSIUM CHLORIDE CRYS ER 20 MEQ PO TBCR
40.0000 meq | EXTENDED_RELEASE_TABLET | Freq: Once | ORAL | Status: DC
  Filled 2020-11-07: qty 2

## 2020-11-07 MED ORDER — SODIUM CHLORIDE 0.9 % IV SOLN
1.0000 g | Freq: Once | INTRAVENOUS | Status: AC
  Administered 2020-11-07: 1 g via INTRAVENOUS
  Filled 2020-11-07: qty 10

## 2020-11-07 MED ORDER — MORPHINE SULFATE (PF) 4 MG/ML IV SOLN
4.0000 mg | Freq: Once | INTRAVENOUS | Status: AC
Start: 2020-11-07 — End: 2020-11-07
  Administered 2020-11-07: 4 mg via INTRAVENOUS
  Filled 2020-11-07: qty 1

## 2020-11-07 MED ORDER — OXYCODONE HCL 5 MG PO TABS: 5.0000 mg | ORAL_TABLET | Freq: Three times a day (TID) | ORAL | 0 refills | Status: AC | PRN

## 2020-11-07 NOTE — ED Notes (Signed)
No answer when called several times from lobby 

## 2020-11-07 NOTE — ED Notes (Signed)
Pt taken to CT.

## 2020-11-07 NOTE — ED Notes (Signed)
POC preg resulted negative

## 2020-11-07 NOTE — ED Provider Notes (Signed)
Rachael Gray Provider Note  Time seen: 10:56 AM  I have reviewed the triage vital signs and the nursing notes.   HISTORY  Chief Complaint Abdominal Pain, Back Pain, and Shaking   HPI Rachael Gray is a 40 y.o. female with a past medical history of diabetes, gastroparesis, pseudoseizures, presents to the emergency Gray for abdominal pain.  According to the patient for the past several days she has been experiencing abdominal pain fairly diffusely throughout her abdomen.  Denies any nausea vomiting diarrhea or dysuria.  States abdominal pain is more so in the upper abdomen.  Patient states she shakes when she has bad pain, currently shaking in the bed but no seizure-like activity.  Denies any fever cough or shortness of breath.  Patient noted to be quite hypertensive 260/193 upon arrival however this could very likely be due to the patient's movement and shaking.  No headache.  No weakness or numbness.  Patient denies any medical conditions and says she takes no medications however she has well-documented diabetes.   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)    Abnormal CT scan, esophagus    Acute esophagitis    Gastric erythema    DKA (diabetic ketoacidosis) ([place]) 01/25/2020   Tobacco abuse disorder 01/25/2020   SIRS due to non-infectious process without acute organ dysfunction ([place]) 01/25/2020   DKA, type 2 ([place]) 01/25/2020   Cannabis use disorder, moderate, dependence ([place]) 12/01/2016    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   Hypokalemia 12/24/2015   Anemia 12/24/2015   Diabetes ([place]) 12/24/2015   Gastroparesis due to DM ([place])    Acute respiratory alkalosis 12/22/2015   Duodenitis 12/22/2015   Vomiting 12/16/2015   Nausea & vomiting 12/10/2014   Narcotic abuse ([place]) 12/09/2014   Sepsis ([place]) 12/07/2014   Anxiety    Pyelonephritis 12/04/2014   Lactic acidosis 12/04/2014    Class: Acute    Past Surgical History:  Procedure Laterality Date   APPENDECTOMY     CHOLECYSTECTOMY     ESOPHAGOGASTRODUODENOSCOPY N/A 06/28/2020   Procedure: ESOPHAGOGASTRODUODENOSCOPY (EGD);  Surgeon: Virgel Manifold, MD;  Location: [facility] ENDOSCOPY;  Service: Endoscopy;  Laterality: N/A;    Prior to Admission medications   Medication Sig Start Date End Date Taking? Authorizing Provider  cefTRIAXone (ROCEPHIN) 1 g injection Inject 1 g into the muscle once for 1 dose. 11/07/20 11/07/20  Lucrezia Starch, MD  insulin NPH-regular Human (NOVOLIN 70/30) (70-30) 100 UNIT/ML injection Inject 10 units under the skin twice daily with breakfast and dinner 01/28/20   Nicole Kindred A, DO  metoCLOPramide (REGLAN) 10 MG tablet Take 1 tablet (10 mg total) by mouth every 8 (eight) hours as needed. Patient not taking: No sig reported 02/25/17   Loney Hering, MD  metoprolol tartrate (LOPRESSOR) 25 MG tablet Take 0.5 tablets (12.5 mg total) by mouth 2 (two) times daily. 06/30/20 07/30/20  Nolberto Hanlon, MD  pantoprazole (PROTONIX) 40 MG tablet Take 1 tablet (40 mg total) by  mouth 2 (two) times daily. 06/30/20 07/30/20  Nolberto Hanlon, MD    Allergies  Allergen Reactions   Dilantin [Phenytoin Sodium Extended] Hives   Doxycycline Swelling   Other    Tomato Rash   Tylenol [Acetaminophen] Rash    Family History  Problem Relation Age of Onset   Breast cancer Maternal Grandmother     Social History Social History   Tobacco Use   Smoking status: Every Day    Packs/day: 0.50    Years: 10.00    Pack  years: 5.00    Types: Cigarettes   Smokeless tobacco: Never  Vaping Use   Vaping Use: Never used  Substance Use Topics   Alcohol use: No   Drug use: Yes    Types: Marijuana    Comment: yesterday    Review of Systems Constitutional: Negative for fever. Cardiovascular: Negative for chest pain. Respiratory: Negative for shortness of breath. Gastrointestinal: Positive for abdominal pain worse across the upper abdomen.  Negative for nausea vomiting or diarrhea. Genitourinary: Negative for urinary compaints Musculoskeletal: Negative for musculoskeletal complaints Neurological: Negative for headache All other ROS negative  ____________________________________________   PHYSICAL EXAM:  VITAL SIGNS: ED Triage Vitals  Enc Vitals Group     BP 11/07/20 1047 (!) 260/193     Pulse Rate 11/07/20 1047 (!) 105     Resp 11/07/20 1047 20     Temp 11/07/20 1047 98.6 F (37 C)     Temp Source 11/07/20 1047 Oral     SpO2 11/07/20 1047 98 %     Weight 11/07/20 1044 125 lb 10.6 oz (57 kg)     Height 11/07/20 1044 '5\' 4"'$  (1.626 m)     Head Circumference --      Peak Flow --      Pain Score --      Pain Loc --      Pain Edu? --      Excl. in [place]? --    Constitutional: Alert and oriented. Well appearing and in no distress.  Patient rocking back and forth in the bed. Eyes: Normal exam ENT      Head: Normocephalic and atraumatic.      Mouth/Throat: Mucous membranes are moist. Cardiovascular: Normal rate, regular rhythm.  Respiratory: Normal respiratory effort without tachypnea nor retractions. Breath sounds are clear  Gastrointestinal: Soft, moderate upper abdominal tenderness with mild diffuse tenderness.  No rebound guarding or distention. Musculoskeletal: Nontender with normal range of motion in all extremities.  Neurologic:  Normal speech and language. No gross focal neurologic deficits  Skin:  Skin is warm, dry and intact.  Psychiatric: Mood and affect are  normal.  ____________________________________________     RADIOLOGY  CT scan shows likely gastritis as well as likely cystitis.  ____________________________________________   INITIAL IMPRESSION / ASSESSMENT AND PLAN / ED COURSE  Pertinent labs & imaging results that were available during my care of the patient were reviewed by me and considered in my medical decision making (see chart for details).   Patient presents emergency Gray for abdominal pain, found to be hypertensive.  Patient presented to the emergency Gray last night but due to a prolonged wait left before being seen.  Lab work last night shows hypokalemia but otherwise no significant findings.  We will recheck labs today.  We will treat pain and nausea and reassess blood pressure.  Given the patient's abdominal tenderness we will likely proceed with CT imaging to rule out intra-abdominal pathology such as colitis, diverticulitis,  cholecystitis, appendicitis, etc.  CT scan shows likely cystitis, urinalysis consistent with urinary tract infection as well.  CT also shows gastritis which could explain the patient's upper abdominal discomfort.  We will place the patient on antibiotics, place the patient on Protonix.  We will have the patient follow-up with her doctor.  Patient states she has been taking her insulin at home.  Patient is finishing her second liter of IV fluids and will be discharged home shortly.  Patient agreeable to plan of care.  Hollin L Grigoryan was evaluated in Emergency Gray on 11/07/2020 for the symptoms described in the history of present illness. She was evaluated in the context of the global COVID-19 pandemic, which necessitated consideration that the patient might be at risk for infection with the SARS-CoV-2 virus that causes COVID-19. Institutional protocols and algorithms that pertain to the evaluation of patients at risk for COVID-19 are in a state of rapid change based on information  released by regulatory bodies including the CDC and federal and state organizations. These policies and algorithms were followed during the patient's care in the ED.  ____________________________________________   FINAL CLINICAL IMPRESSION(S) / ED DIAGNOSES  Abdominal pain    Harvest Dark, MD 11/07/20 1406

## 2020-11-07 NOTE — ED Notes (Signed)
Pt continuously nauseas and shaking.

## 2020-11-07 NOTE — ED Notes (Signed)
Gave pt lunch tray 

## 2020-11-07 NOTE — ED Notes (Signed)
Pt had episode of emesis, small amount.

## 2020-11-07 NOTE — ED Triage Notes (Signed)
Pt reports abd pain, back pain and shaking for 4 days. Pt states she was here last pm but the wait was too long so she left.

## 2020-11-09 LAB — URINE CULTURE: Culture: 80000 — AB

## 2020-11-10 LAB — URINE CULTURE: Culture: >=100000 — AB

## 2020-11-11 NOTE — Progress Notes (Addendum)
ED Antimicrobial Stewardship Positive Culture Follow Up   Rachael Gray is an 40 y.o. female who presented to [facility] on 11/07/2020 with a chief complaint of  Chief Complaint  Patient presents with   Abdominal Pain   Back Pain   Shaking    Recent Results (from the past 720 hour(s))  Urine Culture     Status: Abnormal   Collection Time: 11/06/20  9:53 PM   Specimen: Urine, Clean Catch  Result Value Ref Range Status   Specimen Description   Final    URINE, CLEAN CATCH Performed at [facility], [address] [place], [place] [address]    Special Requests   Final    NONE Performed at [facility], [place], [place] [address]    Culture 80,000 COLONIES/mL STAPHYLOCOCCUS SAPROPHYTICUS (A)  Final   Report Status 11/09/2020 FINAL  Final   Organism ID, Bacteria STAPHYLOCOCCUS SAPROPHYTICUS (A)  Final      Susceptibility   Staphylococcus saprophyticus - MIC*    CIPROFLOXACIN <=0.5 SENSITIVE Sensitive     GENTAMICIN <=0.5 SENSITIVE Sensitive     NITROFURANTOIN <=16 SENSITIVE Sensitive     OXACILLIN 2 RESISTANT Resistant     TETRACYCLINE <=1 SENSITIVE Sensitive     VANCOMYCIN 1 SENSITIVE Sensitive     TRIMETH/SULFA <=10 SENSITIVE Sensitive     CLINDAMYCIN <=0.25 SENSITIVE Sensitive     RIFAMPIN <=0.5 SENSITIVE Sensitive     Inducible Clindamycin NEGATIVE Sensitive     * 80,000 COLONIES/mL STAPHYLOCOCCUS SAPROPHYTICUS  Urine Culture     Status: Abnormal   Collection Time: 11/07/20 12:54 PM   Specimen: Urine, Clean Catch  Result Value Ref Range Status   Specimen Description   Final    URINE, CLEAN CATCH Performed at [facility], [address], [place], [place] [address]    Special Requests   Final    NONE Performed at [facility], [address], [place], [place] [address]    Culture >=100,000 COLONIES/mL STAPHYLOCOCCUS SAPROPHYTICUS (A)  Final   Report Status 11/10/2020 FINAL  Final   Organism ID, Bacteria  STAPHYLOCOCCUS SAPROPHYTICUS (A)  Final      Susceptibility   Staphylococcus saprophyticus - MIC*    CIPROFLOXACIN <=0.5 SENSITIVE Sensitive     GENTAMICIN <=0.5 SENSITIVE Sensitive     NITROFURANTOIN <=16 SENSITIVE Sensitive     OXACILLIN 1 RESISTANT Resistant     TETRACYCLINE <=1 SENSITIVE Sensitive     VANCOMYCIN <=0.5 SENSITIVE Sensitive     TRIMETH/SULFA <=10 SENSITIVE Sensitive     CLINDAMYCIN <=0.25 SENSITIVE Sensitive     RIFAMPIN <=0.5 SENSITIVE Sensitive     Inducible Clindamycin NEGATIVE Sensitive     * >=100,000 COLONIES/mL STAPHYLOCOCCUS SAPROPHYTICUS    '[x]'$  Treated with cephalexin, organism resistant to prescribed antimicrobial  Attempted to reach patient on 08/20, but phone number is disconnected. Attempted to reach pt daughter, but voicemail was not set up.   New antibiotic prescription: Bactrim 1 DS tablet twice daily for 10 days  Called new prescription in to [facility] on [address], instructed pharmacist to counsel patient on discontinuing cephalexin, and start bactrim.   ED Provider: Dr. Jari Pigg   Attempted to reach pt again on 08/21, but no answer/voicemail available.   Narda Rutherford, PharmD Pharmacy Resident  11/11/2020 5:11 PM

## 2020-11-19 ENCOUNTER — Emergency Department
Admission: EM | Admit: 2020-11-19 | Discharge: 2020-11-19 | Disposition: A | Discharge status: Home / Self Care | Payer: Medicaid Other | Attending: Emergency Medicine | Admitting: Emergency Medicine

## 2020-11-19 ENCOUNTER — Emergency Department: Payer: Medicaid Other

## 2020-11-19 ENCOUNTER — Other Ambulatory Visit: Payer: Self-pay

## 2020-11-19 DIAGNOSIS — R52 Pain, unspecified: Secondary | ICD-10-CM | POA: Diagnosis not present

## 2020-11-19 DIAGNOSIS — Z794 Long term (current) use of insulin: Secondary | ICD-10-CM | POA: Diagnosis not present

## 2020-11-19 DIAGNOSIS — R112 Nausea with vomiting, unspecified: Secondary | ICD-10-CM | POA: Insufficient documentation

## 2020-11-19 DIAGNOSIS — E162 Hypoglycemia, unspecified: Secondary | ICD-10-CM | POA: Diagnosis not present

## 2020-11-19 DIAGNOSIS — F17210 Nicotine dependence, cigarettes, uncomplicated: Secondary | ICD-10-CM | POA: Diagnosis not present

## 2020-11-19 DIAGNOSIS — R109 Unspecified abdominal pain: Secondary | ICD-10-CM | POA: Diagnosis not present

## 2020-11-19 DIAGNOSIS — R1084 Generalized abdominal pain: Secondary | ICD-10-CM

## 2020-11-19 DIAGNOSIS — K5641 Fecal impaction: Secondary | ICD-10-CM | POA: Diagnosis not present

## 2020-11-19 DIAGNOSIS — R739 Hyperglycemia, unspecified: Secondary | ICD-10-CM | POA: Diagnosis not present

## 2020-11-19 DIAGNOSIS — K5900 Constipation, unspecified: Secondary | ICD-10-CM

## 2020-11-19 DIAGNOSIS — E1110 Type 2 diabetes mellitus with ketoacidosis without coma: Secondary | ICD-10-CM | POA: Diagnosis not present

## 2020-11-19 DIAGNOSIS — E161 Other hypoglycemia: Secondary | ICD-10-CM | POA: Diagnosis not present

## 2020-11-19 LAB — CBC WITH DIFFERENTIAL/PLATELET
Abs Immature Granulocytes: 0.02 10*3/uL (ref 0.00–0.07)
Basophils Absolute: 0 10*3/uL (ref 0.0–0.1)
Basophils Relative: 0 %
Eosinophils Absolute: 0.2 10*3/uL (ref 0.0–0.5)
Eosinophils Relative: 3 %
HCT: 35.7 % — ABNORMAL LOW (ref 36.0–46.0)
Hemoglobin: 11.8 g/dL — ABNORMAL LOW (ref 12.0–15.0)
Immature Granulocytes: 0 %
Lymphocytes Relative: 23 %
Lymphs Abs: 1.7 10*3/uL (ref 0.7–4.0)
MCH: 27.1 pg (ref 26.0–34.0)
MCHC: 33.1 g/dL (ref 30.0–36.0)
MCV: 81.9 fL (ref 80.0–100.0)
Monocytes Absolute: 0.4 10*3/uL (ref 0.1–1.0)
Monocytes Relative: 5 %
Neutro Abs: 5.2 10*3/uL (ref 1.7–7.7)
Neutrophils Relative %: 69 %
Platelets: 389 10*3/uL (ref 150–400)
RBC: 4.36 MIL/uL (ref 3.87–5.11)
RDW: 14.6 % (ref 11.5–15.5)
Smear Review: NORMAL
WBC: 7.5 10*3/uL (ref 4.0–10.5)
nRBC: 0 % (ref 0.0–0.2)

## 2020-11-19 LAB — LIPASE, BLOOD: Lipase: 27 U/L (ref 11–51)

## 2020-11-19 LAB — COMPREHENSIVE METABOLIC PANEL
ALT: 9 U/L (ref 0–44)
AST: 12 U/L — ABNORMAL LOW (ref 15–41)
Albumin: 3.7 g/dL (ref 3.5–5.0)
Alkaline Phosphatase: 39 U/L (ref 38–126)
Anion gap: 11 (ref 5–15)
BUN: 12 mg/dL (ref 6–20)
CO2: 26 mmol/L (ref 22–32)
Calcium: 9.4 mg/dL (ref 8.9–10.3)
Chloride: 99 mmol/L (ref 98–111)
Creatinine, Ser: 0.56 mg/dL (ref 0.44–1.00)
GFR, Estimated: >60 mL/min (ref >60)
Glucose, Bld: 293 mg/dL — ABNORMAL HIGH (ref 70–99)
Potassium: 3.4 mmol/L — ABNORMAL LOW (ref 3.5–5.1)
Sodium: 136 mmol/L (ref 135–145)
Total Bilirubin: 0.9 mg/dL (ref 0.3–1.2)
Total Protein: 8 g/dL (ref 6.5–8.1)

## 2020-11-19 LAB — HCG, QUANTITATIVE, PREGNANCY: hCG, Beta Chain, Quant, S: <1 m[IU]/mL (ref <5)

## 2020-11-19 LAB — CBG MONITORING, ED: Glucose-Capillary: 320 mg/dL — ABNORMAL HIGH (ref 70–99)

## 2020-11-19 MED ORDER — DROPERIDOL 2.5 MG/ML IJ SOLN
2.5000 mg | Freq: Once | INTRAMUSCULAR | Status: AC
  Administered 2020-11-19: 2.5 mg via INTRAVENOUS
  Filled 2020-11-19: qty 2

## 2020-11-19 MED ORDER — SODIUM CHLORIDE 0.9 % IV BOLUS
1000.0000 mL | Freq: Once | INTRAVENOUS | Status: AC
  Administered 2020-11-19: 1000 mL via INTRAVENOUS

## 2020-11-19 NOTE — ED Notes (Signed)
D/C and new OTC meds discussed with pt, pt verbalized understanding. NAD noted. Pt ambulatory on D/C with steady gait.

## 2020-11-19 NOTE — ED Notes (Signed)
Pt requested apple juice but was educated due to high blood sugar that water or something diet would be better, pt requested diet coke instead.  Pt rocking back and fourth and states that this is her normal.

## 2020-11-19 NOTE — ED Notes (Signed)
This RN chaperones rectal disimpaction.

## 2020-11-19 NOTE — ED Notes (Addendum)
This RN asks patient politely to keep hand still while starting IV in hand. Pt responds "THESE ARE NOT VOLUNTARY BITCH, I can't fucking keep still." This RN had hand prepped with tourniquet on when pt grabbed phone from table and began ignoring this RN. This RN asked if she could have her hand back to start PIV or take off tourniquet. Pt states "im calling my mama, youre dead now bitch". Pt calls mother. This RN introduces self on phone. Pt's mother instructs patient to "calm down". Pt then hangs up on mother and screams "[place], ILL FIND SOMEONE TO FUCKING GET YOU". Patient allowed this RN to start PIV, while this RN attempting to secure PIV, pt rips hand away and begins swinging in air. Pt calls grandmother on phone, this RN introduces self to grandmother. Pt will not allow this RN to secure IV. Pt calls on call light "GET ME THE FUCKING CHARGE RN NOW". This RN attempts to educate patient that verbal abuse will not be tolerates and that this RN will no longer be engaging with patient as communication is no longer therapeutic. Pt states "I can say whatever the fuck I want, im calling my brother in law to fuck you up". This RN no longer engages with patient, printed stickers to label samples -- Charge RN in room to speak with patient.

## 2020-11-19 NOTE — ED Triage Notes (Signed)
Pt BIBA for abd pain, constipation, n/v & hyperglycemia.  Pt states she has large stool ball that she has been trying to pass. Endorses 3 episodes of emesis since yesterday. Hx of gastroparesis.   Pt noted to be rocking self in bed & shaking consistently. She states she will not stop until she gets pain relief.

## 2020-11-19 NOTE — ED Provider Notes (Signed)
[facility] Emergency Department Provider Note   ____________________________________________   Event Date/Time   First MD Initiated Contact with Patient 11/19/20 [phone]     (approximate)  I have reviewed the triage vital signs and the nursing notes.   HISTORY  Chief Complaint Abdominal Pain    HPI Rachael Gray is a 40 y.o. female with past medical history of diabetes, seizures, pseudoseizures, and gastroparesis who presents to the ED complaining of abdominal pain.  Patient reports that she has been constipated for least the past week and feels like she has a "stool ball" stuck near her rectum.  She has been trying to pass this on her own but states she has not taken any medication for constipation.  She reports dealing with increasing abdominal pain along with nausea and multiple episodes of vomiting.  She denies any fevers, cough, chest pain, shortness of breath, dysuria, hematuria, or flank pain.  Per EMS, patient noted to be hyperglycemic.        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)    Abnormal CT scan, esophagus    Acute esophagitis    Gastric erythema    DKA (diabetic ketoacidosis) ([place]) 01/25/2020   Tobacco abuse disorder 01/25/2020   SIRS due to non-infectious process without acute organ dysfunction ([place]) 01/25/2020   DKA, type 2 ([place]) 01/25/2020   Cannabis use disorder, moderate, dependence ([place]) 12/01/2016   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   Hypokalemia 12/24/2015   Anemia  12/24/2015   Diabetes ([place]) 12/24/2015   Gastroparesis due to DM ([place])    Acute respiratory alkalosis 12/22/2015   Duodenitis 12/22/2015   Vomiting 12/16/2015   Nausea & vomiting 12/10/2014   Narcotic abuse ([place]) 12/09/2014   Sepsis ([place]) 12/07/2014   Anxiety    Pyelonephritis 12/04/2014   Lactic acidosis 12/04/2014    Class: Acute    Past Surgical History:  Procedure Laterality Date   APPENDECTOMY     CHOLECYSTECTOMY     ESOPHAGOGASTRODUODENOSCOPY N/A 06/28/2020   Procedure: ESOPHAGOGASTRODUODENOSCOPY (EGD);  Surgeon: Virgel Manifold, MD;  Location: [facility] ENDOSCOPY;  Service: Endoscopy;  Laterality: N/A;    Prior to Admission medications   Medication Sig Start Date End Date Taking? Authorizing Provider  cephALEXin (KEFLEX) 500 MG capsule Take 1 capsule (500 mg total) by mouth 3 (three) times daily. 11/07/20   [place] Dark, MD  insulin NPH-regular Human (NOVOLIN 70/30) (70-30) 100 UNIT/ML injection Inject 10 units under the skin twice daily with breakfast and dinner 01/28/20   Nicole Kindred A, DO  metoCLOPramide (REGLAN) 10 MG tablet Take 1 tablet (10 mg total) by mouth every 8 (eight) hours as needed. Patient not taking: No sig reported 02/25/17   Loney Hering, MD  metoprolol tartrate (LOPRESSOR) 25 MG tablet Take 0.5 tablets (12.5 mg total) by mouth 2 (two) times daily. 06/30/20 07/30/20  Nolberto Hanlon, MD  oxyCODONE (ROXICODONE) 5 MG immediate release tablet Take 1 tablet (5 mg total) by mouth every 8 (eight) hours  as needed. 11/07/20 11/07/21  Harvest Dark, MD  pantoprazole (PROTONIX) 40 MG tablet Take 1 tablet (40 mg total) by mouth 2 (two) times daily. 06/30/20 07/30/20  Nolberto Hanlon, MD  pantoprazole (PROTONIX) 40 MG tablet Take 1 tablet (40 mg total) by mouth daily. 11/07/20 11/07/21  Harvest Dark, MD    Allergies Dilantin [phenytoin sodium extended], Doxycycline, Other, Tomato, and Tylenol [acetaminophen]  Family History  Problem Relation Age of Onset    Breast cancer Maternal Grandmother     Social History Social History   Tobacco Use   Smoking status: Every Day    Packs/day: 0.50    Years: 10.00    Pack years: 5.00    Types: Cigarettes   Smokeless tobacco: Never  Vaping Use   Vaping Use: Never used  Substance Use Topics   Alcohol use: No   Drug use: Yes    Types: Marijuana    Comment: yesterday    Review of Systems  Constitutional: No fever/chills Eyes: No visual changes. ENT: No sore throat. Cardiovascular: Denies chest pain. Respiratory: Denies shortness of breath. Gastrointestinal: Positive for abdominal pain, nausea, and vomiting.  No diarrhea.  Positive for constipation. Genitourinary: Negative for dysuria. Musculoskeletal: Negative for back pain. Skin: Negative for rash. Neurological: Negative for headaches, focal weakness or numbness.  ____________________________________________   PHYSICAL EXAM:  VITAL SIGNS: ED Triage Vitals  Enc Vitals Group     BP      Pulse      Resp      Temp      Temp src      SpO2      Weight      Height      Head Circumference      Peak Flow      Pain Score      Pain Loc      Pain Edu?      Excl. in [place]?     Constitutional: Alert and oriented. Eyes: Conjunctivae are normal. Head: Atraumatic. Nose: No congestion/rhinnorhea. Mouth/Throat: Mucous membranes are moist. Neck: Normal ROM Cardiovascular: Normal rate, regular rhythm. Grossly normal heart sounds.  2+ radial pulses bilaterally. Respiratory: Normal respiratory effort.  No retractions. Lungs CTAB. Gastrointestinal: Soft and nontender. No distention.  Rectal stool ball noted. Genitourinary: deferred Musculoskeletal: No lower extremity tenderness nor edema. Neurologic:  Normal speech and language. No gross focal neurologic deficits are appreciated. Skin:  Skin is warm, dry and intact. No rash noted. Psychiatric: Mood and affect are normal. Speech and behavior are  normal.  ____________________________________________   LABS (all labs ordered are listed, but only abnormal results are displayed)  Labs Reviewed  CBC WITH DIFFERENTIAL/PLATELET - Abnormal; Notable for the following components:      Result Value   Hemoglobin 11.8 (*)    HCT 35.7 (*)    All other components within normal limits  COMPREHENSIVE METABOLIC PANEL - Abnormal; Notable for the following components:   Potassium 3.4 (*)    Glucose, Bld 293 (*)    AST 12 (*)    All other components within normal limits  CBG MONITORING, ED - Abnormal; Notable for the following components:   Glucose-Capillary 320 (*)    All other components within normal limits  LIPASE, BLOOD  URINALYSIS, COMPLETE (UACMP) WITH MICROSCOPIC  POC URINE PREG, ED    PROCEDURES  Procedure(s) performed (including Critical Care):  Procedures  ------------------------------------------------------------------------------------------------------------------- Fecal Disimpaction Procedure Note:  Performed by me:  Patient placed in the lateral recumbent position with knees  drawn towards chest. Nurse present for patient support. Large amount of hard brown stool removed. No complications during procedure.   ------------------------------------------------------------------------------------------------------------------  ____________________________________________   INITIAL IMPRESSION / ASSESSMENT AND PLAN / ED COURSE      40 year old female with past medical history of diabetes, seizures, pseudoseizures, gastroparesis who presents to the ED complaining of abdominal pain and constipation with rectal stool ball for the past week.  Disimpaction was performed with improvement in patient's pain.  We will further assess with labs to ensure no DKA or other electrolyte abnormality given her poorly controlled diabetes.  We will also check abdominal x-ray to ensure no obstructive bowel gas pattern.  Labs are  unremarkable, she has hyperglycemia but no evidence of DKA.  Patient complains of ongoing abdominal pain and nausea, which we will treat with droperidol.  Patient turned over to oncoming provider pending reassessment and abdominal x-ray results.      ____________________________________________   FINAL CLINICAL IMPRESSION(S) / ED DIAGNOSES  Final diagnoses:  Generalized abdominal pain  Fecal impaction (HCC)  Constipation, unspecified constipation type     ED Discharge Orders     None        Note:  This document was prepared using Dragon voice recognition software and may include unintentional dictation errors.    Blake Divine, MD 11/19/20 [phone]

## 2020-11-19 NOTE — ED Notes (Signed)
XRAY SM this RN asking if pt or MD still wanted XRAY done, per night shift RN pt was difficult and would not produce urine for POC to safely get pt to XRAY.   When this RN asked pt to give UA to prove this pt states "I don't have to go now and I do want the XRAY though".  she states she does not have to go but still wants XRAY but states "I am not pregnant for sure". Pt is willing to get xray but not willing to give urine at this time :-/

## 2020-11-19 NOTE — Discharge Instructions (Signed)

## 2020-11-30 DIAGNOSIS — R251 Tremor, unspecified: Secondary | ICD-10-CM | POA: Diagnosis not present

## 2020-11-30 DIAGNOSIS — Z5321 Procedure and treatment not carried out due to patient leaving prior to being seen by health care provider: Secondary | ICD-10-CM | POA: Diagnosis not present

## 2020-12-05 DIAGNOSIS — Z794 Long term (current) use of insulin: Secondary | ICD-10-CM | POA: Diagnosis not present

## 2020-12-05 DIAGNOSIS — R112 Nausea with vomiting, unspecified: Secondary | ICD-10-CM | POA: Diagnosis not present

## 2020-12-05 DIAGNOSIS — Z20822 Contact with and (suspected) exposure to covid-19: Secondary | ICD-10-CM | POA: Diagnosis not present

## 2020-12-05 DIAGNOSIS — R1084 Generalized abdominal pain: Secondary | ICD-10-CM | POA: Diagnosis not present

## 2020-12-05 DIAGNOSIS — Z886 Allergy status to analgesic agent status: Secondary | ICD-10-CM | POA: Diagnosis not present

## 2020-12-05 DIAGNOSIS — Z681 Body mass index (BMI) 19 or less, adult: Secondary | ICD-10-CM | POA: Diagnosis not present

## 2020-12-05 DIAGNOSIS — F17210 Nicotine dependence, cigarettes, uncomplicated: Secondary | ICD-10-CM | POA: Diagnosis not present

## 2020-12-05 DIAGNOSIS — E876 Hypokalemia: Secondary | ICD-10-CM | POA: Diagnosis not present

## 2020-12-05 DIAGNOSIS — Z888 Allergy status to other drugs, medicaments and biological substances status: Secondary | ICD-10-CM | POA: Diagnosis not present

## 2020-12-05 DIAGNOSIS — R6881 Early satiety: Secondary | ICD-10-CM | POA: Diagnosis not present

## 2020-12-05 DIAGNOSIS — R1110 Vomiting, unspecified: Secondary | ICD-10-CM | POA: Diagnosis not present

## 2020-12-05 DIAGNOSIS — R590 Localized enlarged lymph nodes: Secondary | ICD-10-CM | POA: Diagnosis not present

## 2020-12-05 DIAGNOSIS — E1143 Type 2 diabetes mellitus with diabetic autonomic (poly)neuropathy: Secondary | ICD-10-CM | POA: Diagnosis not present

## 2020-12-05 DIAGNOSIS — K3184 Gastroparesis: Secondary | ICD-10-CM | POA: Diagnosis not present

## 2020-12-05 DIAGNOSIS — R634 Abnormal weight loss: Secondary | ICD-10-CM | POA: Diagnosis not present

## 2020-12-06 DIAGNOSIS — R590 Localized enlarged lymph nodes: Secondary | ICD-10-CM | POA: Diagnosis not present

## 2020-12-06 DIAGNOSIS — Z794 Long term (current) use of insulin: Secondary | ICD-10-CM | POA: Diagnosis not present

## 2020-12-06 DIAGNOSIS — F17210 Nicotine dependence, cigarettes, uncomplicated: Secondary | ICD-10-CM | POA: Diagnosis not present

## 2020-12-06 DIAGNOSIS — R634 Abnormal weight loss: Secondary | ICD-10-CM | POA: Diagnosis not present

## 2020-12-06 DIAGNOSIS — L931 Subacute cutaneous lupus erythematosus: Secondary | ICD-10-CM | POA: Diagnosis not present

## 2020-12-06 DIAGNOSIS — R109 Unspecified abdominal pain: Secondary | ICD-10-CM | POA: Diagnosis not present

## 2020-12-06 DIAGNOSIS — R112 Nausea with vomiting, unspecified: Secondary | ICD-10-CM | POA: Diagnosis not present

## 2020-12-06 DIAGNOSIS — R1084 Generalized abdominal pain: Secondary | ICD-10-CM | POA: Diagnosis not present

## 2020-12-06 DIAGNOSIS — E278 Other specified disorders of adrenal gland: Secondary | ICD-10-CM | POA: Diagnosis not present

## 2020-12-06 DIAGNOSIS — E119 Type 2 diabetes mellitus without complications: Secondary | ICD-10-CM | POA: Diagnosis not present

## 2020-12-06 DIAGNOSIS — I7090 Unspecified atherosclerosis: Secondary | ICD-10-CM | POA: Diagnosis not present

## 2020-12-06 DIAGNOSIS — R221 Localized swelling, mass and lump, neck: Secondary | ICD-10-CM | POA: Diagnosis not present

## 2020-12-07 DIAGNOSIS — R109 Unspecified abdominal pain: Secondary | ICD-10-CM | POA: Diagnosis not present

## 2020-12-07 DIAGNOSIS — E1165 Type 2 diabetes mellitus with hyperglycemia: Secondary | ICD-10-CM | POA: Diagnosis not present

## 2020-12-07 DIAGNOSIS — Z794 Long term (current) use of insulin: Secondary | ICD-10-CM | POA: Diagnosis not present

## 2020-12-07 DIAGNOSIS — R634 Abnormal weight loss: Secondary | ICD-10-CM | POA: Diagnosis not present

## 2020-12-07 DIAGNOSIS — R221 Localized swelling, mass and lump, neck: Secondary | ICD-10-CM | POA: Diagnosis not present

## 2020-12-07 DIAGNOSIS — R112 Nausea with vomiting, unspecified: Secondary | ICD-10-CM | POA: Diagnosis not present

## 2020-12-10 DIAGNOSIS — R1084 Generalized abdominal pain: Secondary | ICD-10-CM | POA: Diagnosis not present

## 2021-02-22 DIAGNOSIS — Z419 Encounter for procedure for purposes other than remedying health state, unspecified: Secondary | ICD-10-CM | POA: Diagnosis not present

## 2021-03-25 DIAGNOSIS — Z419 Encounter for procedure for purposes other than remedying health state, unspecified: Secondary | ICD-10-CM | POA: Diagnosis not present

## 2021-04-25 DIAGNOSIS — Z419 Encounter for procedure for purposes other than remedying health state, unspecified: Secondary | ICD-10-CM | POA: Diagnosis not present

## 2021-05-13 ENCOUNTER — Other Ambulatory Visit: Payer: Self-pay

## 2021-05-13 ENCOUNTER — Emergency Department
Admission: EM | Admit: 2021-05-13 | Discharge: 2021-05-14 | Disposition: A | Discharge status: Home / Self Care | Payer: Medicaid Other | Attending: Emergency Medicine | Admitting: Emergency Medicine

## 2021-05-13 ENCOUNTER — Encounter: Payer: Self-pay | Admitting: Emergency Medicine

## 2021-05-13 DIAGNOSIS — R1084 Generalized abdominal pain: Secondary | ICD-10-CM | POA: Diagnosis not present

## 2021-05-13 DIAGNOSIS — E1165 Type 2 diabetes mellitus with hyperglycemia: Secondary | ICD-10-CM | POA: Diagnosis not present

## 2021-05-13 DIAGNOSIS — E876 Hypokalemia: Secondary | ICD-10-CM | POA: Insufficient documentation

## 2021-05-13 DIAGNOSIS — R109 Unspecified abdominal pain: Secondary | ICD-10-CM

## 2021-05-13 DIAGNOSIS — K3184 Gastroparesis: Secondary | ICD-10-CM | POA: Insufficient documentation

## 2021-05-13 DIAGNOSIS — R112 Nausea with vomiting, unspecified: Secondary | ICD-10-CM | POA: Diagnosis present

## 2021-05-13 LAB — URINALYSIS, ROUTINE W REFLEX MICROSCOPIC
Bilirubin Urine: NEGATIVE
Glucose, UA: 50 mg/dL — AB
Ketones, ur: 5 mg/dL — AB
Nitrite: NEGATIVE
Protein, ur: 100 mg/dL — AB
RBC / HPF: >50 RBC/hpf — ABNORMAL HIGH (ref 0–5)
Specific Gravity, Urine: 1.026 (ref 1.005–1.030)
pH: 5 (ref 5.0–8.0)

## 2021-05-13 LAB — CBC
HCT: 41.5 % (ref 36.0–46.0)
Hemoglobin: 13.8 g/dL (ref 12.0–15.0)
MCH: 28.5 pg (ref 26.0–34.0)
MCHC: 33.3 g/dL (ref 30.0–36.0)
MCV: 85.7 fL (ref 80.0–100.0)
Platelets: 282 10*3/uL (ref 150–400)
RBC: 4.84 MIL/uL (ref 3.87–5.11)
RDW: 13.4 % (ref 11.5–15.5)
WBC: 8.3 10*3/uL (ref 4.0–10.5)
nRBC: 0 % (ref 0.0–0.2)

## 2021-05-13 LAB — COMPREHENSIVE METABOLIC PANEL
ALT: 8 U/L (ref 0–44)
AST: 13 U/L — ABNORMAL LOW (ref 15–41)
Albumin: 4.5 g/dL (ref 3.5–5.0)
Alkaline Phosphatase: 41 U/L (ref 38–126)
Anion gap: 13 (ref 5–15)
BUN: 20 mg/dL (ref 6–20)
CO2: 29 mmol/L (ref 22–32)
Calcium: 10.6 mg/dL — ABNORMAL HIGH (ref 8.9–10.3)
Chloride: 93 mmol/L — ABNORMAL LOW (ref 98–111)
Creatinine, Ser: 0.83 mg/dL (ref 0.44–1.00)
GFR, Estimated: >60 mL/min (ref >60)
Glucose, Bld: 232 mg/dL — ABNORMAL HIGH (ref 70–99)
Potassium: 3.3 mmol/L — ABNORMAL LOW (ref 3.5–5.1)
Sodium: 135 mmol/L (ref 135–145)
Total Bilirubin: 0.5 mg/dL (ref 0.3–1.2)
Total Protein: 9 g/dL — ABNORMAL HIGH (ref 6.5–8.1)

## 2021-05-13 LAB — LIPASE, BLOOD: Lipase: 31 U/L (ref 11–51)

## 2021-05-13 LAB — POC URINE PREG, ED: Preg Test, Ur: NEGATIVE

## 2021-05-13 MED ORDER — MORPHINE SULFATE (PF) 4 MG/ML IV SOLN
4.0000 mg | Freq: Once | INTRAVENOUS | Status: AC
  Administered 2021-05-13: 4 mg via INTRAVENOUS
  Filled 2021-05-13: qty 1

## 2021-05-13 MED ORDER — SODIUM CHLORIDE 0.9 % IV BOLUS
1000.0000 mL | Freq: Once | INTRAVENOUS | Status: AC
  Administered 2021-05-13: 1000 mL via INTRAVENOUS

## 2021-05-13 MED ORDER — METOCLOPRAMIDE HCL 5 MG/ML IJ SOLN
10.0000 mg | Freq: Once | INTRAMUSCULAR | Status: AC
  Administered 2021-05-13: 10 mg via INTRAVENOUS
  Filled 2021-05-13: qty 2

## 2021-05-13 NOTE — ED Triage Notes (Signed)
Pt to ED via POV with c/o abd pain and nausea that has been going on for a few days. Pt has cup in hand during triage. States that pain from abd is going into her legs.

## 2021-05-13 NOTE — ED Notes (Signed)
Heard patient yelling from her room. As I had just arrived to this pod and taken over the care of this patient, I went to speak with her. Patient was found literally bouncing around on the stretcher and demanding her pain medication. I told her that I had just arrived and that I was getting ready to review her chart, which I did standing in her room. I noted the need for INT and medications. I started her INT, drew extra blood and administered her meds. When I attempted to get assessment information, all she would tell me is that her belly hurt and she has gastroparesis. She refused to answer any other questions. Patient watched cartoons on the TV and continued to bounce on the stretcher. I put the bedrails up and asked her not to get OOB due to the narcotic that I administered.

## 2021-05-13 NOTE — ED Provider Notes (Signed)
11:30 PM  Assumed care at signout.  Patient is a 41 year old female with history of diabetes, gastroparesis who presents to the emergency department abdominal pain and vomiting from her gastroparesis.  Abdominal exam benign.  Labs, urine reassuring.  Patient getting morphine, Reglan and IV fluids and will need to be reassessed and p.o. challenge.  It also appears per her records that she is frequently positive for cannabinoids which may also be playing a role in her symptoms today.  12:35 AM  Pt reports feeling much better and has been able to tolerate p.o.  Repeat abdominal exam is benign.  I have reviewed her labs today.  Normal white blood cell count, hemoglobin.  Minimally elevated glucose but no DKA.  LFTs and lipase normal.  Pregnancy test negative.  She does have blood in her urine but is on her menstrual cycle.  She states she has antiemetics for home.  Will discharge.  No admission needed at this time given work-up reassuring and patient is feeling better.   At this time, I do not feel there is any life-threatening condition present. I reviewed all nursing notes, vitals, pertinent previous records.  All lab and urine results, EKGs, imaging ordered have been independently reviewed and interpreted by myself.  I reviewed all available radiology reports from any imaging ordered this visit.  Based on my assessment, I feel the patient is safe to be discharged home without further emergent workup and can continue workup as an outpatient as needed. Discussed all findings, treatment plan as well as usual and customary return precautions with patient and family.  They verbalize understanding and are comfortable with this plan.  Outpatient follow-up has been provided as needed.  All questions have been answered.    Amberle Lyter, Delice Bison, DO 05/14/21 [phone]

## 2021-05-13 NOTE — ED Provider Notes (Signed)
[facility] Provider Note    Event Date/Time   First MD Initiated Contact with Patient 05/13/21 2237     (approximate)  History   Chief Complaint: Abdominal Pain and Emesis  HPI  Rachael Gray is a 41 y.o. female with a past medical history of diabetes, gastroparesis, presents to the emergency department for abdominal pain.  According to the patient for the past 2 days she has been experiencing diffuse abdominal discomfort nausea vomiting consistent with her gastroparesis.  Patient states the frequency has been increasing this now occurs once or twice a week.  Patient states today's episode has been worse and she has not been able to tolerate any fluids since yesterday so she came to the emergency department for evaluation.  Patient denies any fever.  Denies any diarrhea.  No dysuria.  Physical Exam   Triage Vital Signs: ED Triage Vitals  Enc Vitals Group     BP 05/13/21 2037 114/76     Pulse Rate 05/13/21 2037 (!) 117     Resp 05/13/21 2037 15     Temp 05/13/21 2037 98.4 F (36.9 C)     Temp Source 05/13/21 2037 Oral     SpO2 05/13/21 2037 98 %     Weight --      Height --      Head Circumference --      Peak Flow --      Pain Score 05/13/21 2034 8     Pain Loc --      Pain Edu? --      Excl. in [place]? --     Most recent vital signs: Vitals:   05/13/21 2037  BP: 114/76  Pulse: (!) 117  Resp: 15  Temp: 98.4 F (36.9 C)  SpO2: 98%    General: Awake, patient rocking back and forth in bed appears to be uncomfortable. CV:  Good peripheral perfusion.  Regular rate and rhythm  Resp:  Normal effort.  Equal breath sounds bilaterally.  Abd:  No distention.  Soft, mild diffuse tenderness without focal tenderness identified.  No rebound guarding or distention.    ED Results / Procedures / Treatments   MEDICATIONS ORDERED IN ED: Medications  metoCLOPramide (REGLAN) injection 10 mg (has no administration in time range)  sodium chloride 0.9 %  bolus 1,000 mL (has no administration in time range)  morphine (PF) 4 MG/ML injection 4 mg (has no administration in time range)     IMPRESSION / MDM / ASSESSMENT AND PLAN / ED COURSE  I reviewed the triage vital signs and the nursing notes.  Patient presents to the emergency department for abdominal pain nausea vomiting consistent with the past episodes of gastroparesis.  Patient's work-up today shows a normal white blood cell count, normal CBC.  Urinalysis shows rare bacteria but no overwhelming concern for UTI.  Patient has no urinary symptoms.  Patient's chemistry shows mild hypokalemia with mild hyperglycemia.  We will IV hydrate.  Patient's pregnancy test is negative and lipase is normal.  LFTs reassuring.  Suspect patient is likely undergoing an episode of gastroparesis consistent with her past episodes.  We will IV hydrate treat pain, nausea and reassess.  Patient agreeable to plan.  Patient care signed out to oncoming provider.  FINAL CLINICAL IMPRESSION(S) / ED DIAGNOSES   Gastroparesis Nausea vomiting    Note:  This document was prepared using Dragon voice recognition software and may include unintentional dictation errors.   Harvest Dark, MD 05/13/21 2317

## 2021-05-14 MED ORDER — POTASSIUM CHLORIDE CRYS ER 20 MEQ PO TBCR
40.0000 meq | EXTENDED_RELEASE_TABLET | Freq: Once | ORAL | Status: AC
  Administered 2021-05-14: 40 meq via ORAL
  Filled 2021-05-14: qty 2

## 2021-05-14 NOTE — ED Notes (Signed)
Vital signs updated. PO challenge tolerated. Patient provided with discharge instructions and follow-up. INT removed. Patient will ambulate out to the waiting room.

## 2021-05-15 ENCOUNTER — Telehealth: Payer: Self-pay

## 2021-05-15 NOTE — Telephone Encounter (Signed)
Transition Care Management Unsuccessful Follow-up Telephone Call  Date of discharge and from where:  05/14/2021-[facility]  Attempts:  1st Attempt  Reason for unsuccessful TCM follow-up call:  Left voice message

## 2021-05-16 NOTE — Telephone Encounter (Signed)
Transition Care Management Unsuccessful Follow-up Telephone Call  Date of discharge and from where:  05/14/2021-[facility]  Attempts:  2nd Attempt  Reason for unsuccessful TCM follow-up call:  Left voice message

## 2021-05-17 NOTE — Telephone Encounter (Signed)
Patient seen [facility] for PCP

## 2021-05-23 DIAGNOSIS — Z419 Encounter for procedure for purposes other than remedying health state, unspecified: Secondary | ICD-10-CM | POA: Diagnosis not present

## 2021-05-31 DIAGNOSIS — Z794 Long term (current) use of insulin: Secondary | ICD-10-CM | POA: Diagnosis not present

## 2021-05-31 DIAGNOSIS — Z09 Encounter for follow-up examination after completed treatment for conditions other than malignant neoplasm: Secondary | ICD-10-CM | POA: Diagnosis not present

## 2021-05-31 DIAGNOSIS — E1165 Type 2 diabetes mellitus with hyperglycemia: Secondary | ICD-10-CM | POA: Diagnosis not present

## 2021-05-31 DIAGNOSIS — F4323 Adjustment disorder with mixed anxiety and depressed mood: Secondary | ICD-10-CM | POA: Diagnosis not present

## 2021-05-31 DIAGNOSIS — R634 Abnormal weight loss: Secondary | ICD-10-CM | POA: Diagnosis not present

## 2021-06-15 DIAGNOSIS — F1290 Cannabis use, unspecified, uncomplicated: Secondary | ICD-10-CM | POA: Diagnosis not present

## 2021-06-15 DIAGNOSIS — Z794 Long term (current) use of insulin: Secondary | ICD-10-CM | POA: Diagnosis not present

## 2021-06-15 DIAGNOSIS — Z3042 Encounter for surveillance of injectable contraceptive: Secondary | ICD-10-CM | POA: Diagnosis not present

## 2021-06-15 DIAGNOSIS — F32A Depression, unspecified: Secondary | ICD-10-CM | POA: Diagnosis not present

## 2021-06-15 DIAGNOSIS — E278 Other specified disorders of adrenal gland: Secondary | ICD-10-CM | POA: Diagnosis not present

## 2021-06-15 DIAGNOSIS — F419 Anxiety disorder, unspecified: Secondary | ICD-10-CM | POA: Diagnosis not present

## 2021-06-15 DIAGNOSIS — R634 Abnormal weight loss: Secondary | ICD-10-CM | POA: Diagnosis not present

## 2021-06-15 DIAGNOSIS — E1165 Type 2 diabetes mellitus with hyperglycemia: Secondary | ICD-10-CM | POA: Diagnosis not present

## 2021-06-15 DIAGNOSIS — R112 Nausea with vomiting, unspecified: Secondary | ICD-10-CM | POA: Diagnosis not present

## 2021-06-23 DIAGNOSIS — Z419 Encounter for procedure for purposes other than remedying health state, unspecified: Secondary | ICD-10-CM | POA: Diagnosis not present

## 2021-07-20 DIAGNOSIS — E1129 Type 2 diabetes mellitus with other diabetic kidney complication: Secondary | ICD-10-CM | POA: Diagnosis not present

## 2021-07-20 DIAGNOSIS — R112 Nausea with vomiting, unspecified: Secondary | ICD-10-CM | POA: Diagnosis not present

## 2021-07-20 DIAGNOSIS — F419 Anxiety disorder, unspecified: Secondary | ICD-10-CM | POA: Diagnosis not present

## 2021-07-20 DIAGNOSIS — R634 Abnormal weight loss: Secondary | ICD-10-CM | POA: Diagnosis not present

## 2021-07-20 DIAGNOSIS — R809 Proteinuria, unspecified: Secondary | ICD-10-CM | POA: Diagnosis not present

## 2021-07-20 DIAGNOSIS — F32A Depression, unspecified: Secondary | ICD-10-CM | POA: Diagnosis not present

## 2021-07-20 DIAGNOSIS — G894 Chronic pain syndrome: Secondary | ICD-10-CM | POA: Diagnosis not present

## 2021-07-20 DIAGNOSIS — E278 Other specified disorders of adrenal gland: Secondary | ICD-10-CM | POA: Diagnosis not present

## 2021-07-20 DIAGNOSIS — E1165 Type 2 diabetes mellitus with hyperglycemia: Secondary | ICD-10-CM | POA: Diagnosis not present

## 2021-07-23 DIAGNOSIS — Z419 Encounter for procedure for purposes other than remedying health state, unspecified: Secondary | ICD-10-CM | POA: Diagnosis not present

## 2021-08-23 DIAGNOSIS — Z419 Encounter for procedure for purposes other than remedying health state, unspecified: Secondary | ICD-10-CM | POA: Diagnosis not present

## 2021-09-19 DIAGNOSIS — Z833 Family history of diabetes mellitus: Secondary | ICD-10-CM | POA: Diagnosis not present

## 2021-09-19 DIAGNOSIS — F419 Anxiety disorder, unspecified: Secondary | ICD-10-CM | POA: Diagnosis not present

## 2021-09-19 DIAGNOSIS — F319 Bipolar disorder, unspecified: Secondary | ICD-10-CM | POA: Diagnosis not present

## 2021-09-19 DIAGNOSIS — G8929 Other chronic pain: Secondary | ICD-10-CM | POA: Diagnosis not present

## 2021-09-19 DIAGNOSIS — Z803 Family history of malignant neoplasm of breast: Secondary | ICD-10-CM | POA: Diagnosis not present

## 2021-09-19 DIAGNOSIS — E11630 Type 2 diabetes mellitus with periodontal disease: Secondary | ICD-10-CM | POA: Diagnosis not present

## 2021-09-19 DIAGNOSIS — I10 Essential (primary) hypertension: Secondary | ICD-10-CM | POA: Diagnosis not present

## 2021-09-19 DIAGNOSIS — Z885 Allergy status to narcotic agent status: Secondary | ICD-10-CM | POA: Diagnosis not present

## 2021-09-19 DIAGNOSIS — Z9181 History of falling: Secondary | ICD-10-CM | POA: Diagnosis not present

## 2021-09-22 DIAGNOSIS — Z419 Encounter for procedure for purposes other than remedying health state, unspecified: Secondary | ICD-10-CM | POA: Diagnosis not present

## 2021-10-03 ENCOUNTER — Emergency Department
Admission: EM | Admit: 2021-10-03 | Discharge: 2021-10-03 | Disposition: A | Discharge status: Home / Self Care | Payer: Medicaid Other | Attending: Emergency Medicine | Admitting: Emergency Medicine

## 2021-10-03 DIAGNOSIS — R569 Unspecified convulsions: Secondary | ICD-10-CM

## 2021-10-03 DIAGNOSIS — D72829 Elevated white blood cell count, unspecified: Secondary | ICD-10-CM | POA: Diagnosis not present

## 2021-10-03 DIAGNOSIS — E119 Type 2 diabetes mellitus without complications: Secondary | ICD-10-CM | POA: Diagnosis not present

## 2021-10-03 DIAGNOSIS — G40909 Epilepsy, unspecified, not intractable, without status epilepticus: Secondary | ICD-10-CM | POA: Diagnosis not present

## 2021-10-03 DIAGNOSIS — R4020 Unspecified coma: Secondary | ICD-10-CM | POA: Diagnosis not present

## 2021-10-03 DIAGNOSIS — Z743 Need for continuous supervision: Secondary | ICD-10-CM | POA: Diagnosis not present

## 2021-10-03 LAB — COMPREHENSIVE METABOLIC PANEL
ALT: 12 U/L (ref 0–44)
AST: 18 U/L (ref 15–41)
Albumin: 4.3 g/dL (ref 3.5–5.0)
Alkaline Phosphatase: 43 U/L (ref 38–126)
Anion gap: 11 (ref 5–15)
BUN: 14 mg/dL (ref 6–20)
CO2: 27 mmol/L (ref 22–32)
Calcium: 9.9 mg/dL (ref 8.9–10.3)
Chloride: 102 mmol/L (ref 98–111)
Creatinine, Ser: 0.63 mg/dL (ref 0.44–1.00)
GFR, Estimated: >60 mL/min (ref >60)
Glucose, Bld: 165 mg/dL — ABNORMAL HIGH (ref 70–99)
Potassium: 4.3 mmol/L (ref 3.5–5.1)
Sodium: 140 mmol/L (ref 135–145)
Total Bilirubin: 1.2 mg/dL (ref 0.3–1.2)
Total Protein: 8 g/dL (ref 6.5–8.1)

## 2021-10-03 LAB — CBC
HCT: 40.5 % (ref 36.0–46.0)
Hemoglobin: 13.1 g/dL (ref 12.0–15.0)
MCH: 28.1 pg (ref 26.0–34.0)
MCHC: 32.3 g/dL (ref 30.0–36.0)
MCV: 86.7 fL (ref 80.0–100.0)
Platelets: 244 10*3/uL (ref 150–400)
RBC: 4.67 MIL/uL (ref 3.87–5.11)
RDW: 14.4 % (ref 11.5–15.5)
WBC: 14.7 10*3/uL — ABNORMAL HIGH (ref 4.0–10.5)
nRBC: 0 % (ref 0.0–0.2)

## 2021-10-03 MED ORDER — LORAZEPAM 2 MG/ML IJ SOLN
1.0000 mg | Freq: Once | INTRAMUSCULAR | Status: AC
  Administered 2021-10-03: 1 mg via INTRAVENOUS
  Filled 2021-10-03: qty 1

## 2021-10-03 MED ORDER — MORPHINE SULFATE (PF) 4 MG/ML IV SOLN
4.0000 mg | Freq: Once | INTRAVENOUS | Status: AC
  Administered 2021-10-03: 4 mg via INTRAVENOUS
  Filled 2021-10-03: qty 1

## 2021-10-03 MED ORDER — SODIUM CHLORIDE 0.9 % IV BOLUS
1000.0000 mL | Freq: Once | INTRAVENOUS | Status: AC
  Administered 2021-10-03: 1000 mL via INTRAVENOUS

## 2021-10-03 NOTE — ED Notes (Signed)
Pt given ice chips, juice and a warm blanket at this time.

## 2021-10-03 NOTE — ED Notes (Signed)
Pt endorses that she was taken off her '10mg'$  Keppra recently by neurologist.

## 2021-10-03 NOTE — ED Triage Notes (Signed)
Pt has hx of epilepsy. Pt was taken off her seizure meds to try and figure out which medication caused tremors. PT bsg 184 BP 130/83. Pt had a seizure with EMS and was unconscious. Before EMS could give meds pt awoke.

## 2021-10-03 NOTE — ED Provider Notes (Signed)
   [facility] Provider Note    Event Date/Time   First MD Initiated Contact with Patient 10/03/21 1117     (approximate)  History   Chief Complaint: Seizure-like activity  HPI  Rachael Gray is a 41 y.o. female with a past medical history of seizures, pseudoseizures, diabetes, lupus, presents to the emergency department for seizure-like activity.  According to the patient she had seizure-like activity at home.  EMS states seizure-like activity for them as well they brought the patient to the emergency department for evaluation.  Upon arrival to the emergency department patient began having shaking consistent with seizure-like activity.  I personally evaluated the patient during this episode she is able to converse with me normally, answer questions appropriately.  No concern for ongoing seizure.  Physical Exam   Most recent vital signs: There were no vitals filed for this visit.  General: Awake, no distress.  Patient is rhythmically twitching her extremities on both sides, but conversing normally following commands and answering questions throughout. CV:  Good peripheral perfusion.  Regular rate and rhythm around 100 bpm Resp:  Normal effort.  Equal breath sounds bilaterally.  Abd:  No distention.  Soft, nontender.  No rebound or guarding.  ED Results / Procedures / Treatments   MEDICATIONS ORDERED IN ED: Medications  sodium chloride 0.9 % bolus 1,000 mL (has no administration in time range)  LORazepam (ATIVAN) injection 1 mg (has no administration in time range)     IMPRESSION / MDM / ASSESSMENT AND PLAN / ED COURSE  I reviewed the triage vital signs and the nursing notes.  Patient's presentation is most consistent with acute presentation with potential threat to life or bodily function.  Patient presents emergency department for seizure-like activity at home.  EMS also witnessed seizure-like activity and shortly after arrival patient began having  seizure-like activity in the emergency department.  I evaluated the patient during the episode she was shaking both of her upper extremities however was able to converse normally answer questions and follow commands throughout.  Exam is most consistent with pseudoseizure.  Patient does appear anxious, we will check labs we will IV hydrate check a urine sample we will dose 1 mg of IV Ativan to help relax the patient while performing her emergency department work-up.  Patient agreeable to plan of care.  Patient was complaining of pain throughout her body from all of the shaking.  Patient given 1 dose of pain medication as he states he feels much better.  Patient's lab work does show mild leukocytosis but otherwise reassuring CBC, reassuring chemistry.  Patient states she is feeling better and wishes to go home.  Denies any urinary symptoms and does not wish to wait for a urinalysis per patient.  We will have the patient follow-up with her doctor.  Patient agreeable to plan of care.  FINAL CLINICAL IMPRESSION(S) / ED DIAGNOSES   Seizure-like activity   Note:  This document was prepared using Dragon voice recognition software and may include unintentional dictation errors.   Harvest Dark, MD 10/03/21 1451

## 2021-10-03 NOTE — ED Notes (Signed)
RN hooked pt IV to fluids and admin meds as provider ordered. Pt stopped shaking for that time than started again when RN was finished with IV.

## 2021-10-07 ENCOUNTER — Emergency Department: Payer: Medicaid Other

## 2021-10-07 ENCOUNTER — Other Ambulatory Visit: Payer: Self-pay

## 2021-10-07 ENCOUNTER — Emergency Department
Admission: EM | Admit: 2021-10-07 | Discharge: 2021-10-07 | Disposition: A | Discharge status: Home / Self Care | Payer: Medicaid Other | Attending: Emergency Medicine | Admitting: Emergency Medicine

## 2021-10-07 DIAGNOSIS — R569 Unspecified convulsions: Secondary | ICD-10-CM | POA: Diagnosis not present

## 2021-10-07 DIAGNOSIS — D72829 Elevated white blood cell count, unspecified: Secondary | ICD-10-CM | POA: Diagnosis not present

## 2021-10-07 DIAGNOSIS — K3184 Gastroparesis: Secondary | ICD-10-CM | POA: Insufficient documentation

## 2021-10-07 DIAGNOSIS — E119 Type 2 diabetes mellitus without complications: Secondary | ICD-10-CM | POA: Insufficient documentation

## 2021-10-07 DIAGNOSIS — E876 Hypokalemia: Secondary | ICD-10-CM | POA: Diagnosis not present

## 2021-10-07 DIAGNOSIS — K760 Fatty (change of) liver, not elsewhere classified: Secondary | ICD-10-CM | POA: Diagnosis not present

## 2021-10-07 DIAGNOSIS — R0689 Other abnormalities of breathing: Secondary | ICD-10-CM | POA: Diagnosis not present

## 2021-10-07 DIAGNOSIS — R000 Tachycardia, unspecified: Secondary | ICD-10-CM | POA: Diagnosis not present

## 2021-10-07 DIAGNOSIS — G40909 Epilepsy, unspecified, not intractable, without status epilepticus: Secondary | ICD-10-CM | POA: Diagnosis not present

## 2021-10-07 DIAGNOSIS — K2970 Gastritis, unspecified, without bleeding: Secondary | ICD-10-CM

## 2021-10-07 DIAGNOSIS — D72819 Decreased white blood cell count, unspecified: Secondary | ICD-10-CM | POA: Diagnosis not present

## 2021-10-07 DIAGNOSIS — Z743 Need for continuous supervision: Secondary | ICD-10-CM | POA: Diagnosis not present

## 2021-10-07 DIAGNOSIS — K3189 Other diseases of stomach and duodenum: Secondary | ICD-10-CM | POA: Diagnosis not present

## 2021-10-07 LAB — COMPREHENSIVE METABOLIC PANEL
ALT: 9 U/L (ref 0–44)
AST: 14 U/L — ABNORMAL LOW (ref 15–41)
Albumin: 4.6 g/dL (ref 3.5–5.0)
Alkaline Phosphatase: 40 U/L (ref 38–126)
Anion gap: 14 (ref 5–15)
BUN: 16 mg/dL (ref 6–20)
CO2: 25 mmol/L (ref 22–32)
Calcium: 9.6 mg/dL (ref 8.9–10.3)
Chloride: 99 mmol/L (ref 98–111)
Creatinine, Ser: 0.66 mg/dL (ref 0.44–1.00)
GFR, Estimated: >60 mL/min (ref >60)
Glucose, Bld: 195 mg/dL — ABNORMAL HIGH (ref 70–99)
Potassium: 3.4 mmol/L — ABNORMAL LOW (ref 3.5–5.1)
Sodium: 138 mmol/L (ref 135–145)
Total Bilirubin: 0.5 mg/dL (ref 0.3–1.2)
Total Protein: 8.1 g/dL (ref 6.5–8.1)

## 2021-10-07 LAB — CBC WITH DIFFERENTIAL/PLATELET
Abs Immature Granulocytes: 0.07 10*3/uL (ref 0.00–0.07)
Basophils Absolute: 0.1 10*3/uL (ref 0.0–0.1)
Basophils Relative: 0 %
Eosinophils Absolute: 0 10*3/uL (ref 0.0–0.5)
Eosinophils Relative: 0 %
HCT: 40.9 % (ref 36.0–46.0)
Hemoglobin: 13.3 g/dL (ref 12.0–15.0)
Immature Granulocytes: 1 %
Lymphocytes Relative: 14 %
Lymphs Abs: 2.2 10*3/uL (ref 0.7–4.0)
MCH: 27.9 pg (ref 26.0–34.0)
MCHC: 32.5 g/dL (ref 30.0–36.0)
MCV: 85.9 fL (ref 80.0–100.0)
Monocytes Absolute: 0.7 10*3/uL (ref 0.1–1.0)
Monocytes Relative: 5 %
Neutro Abs: 12.4 10*3/uL — ABNORMAL HIGH (ref 1.7–7.7)
Neutrophils Relative %: 80 %
Platelets: 259 10*3/uL (ref 150–400)
RBC: 4.76 MIL/uL (ref 3.87–5.11)
RDW: 14.1 % (ref 11.5–15.5)
Smear Review: NORMAL
WBC: 15.4 10*3/uL — ABNORMAL HIGH (ref 4.0–10.5)
nRBC: 0 % (ref 0.0–0.2)

## 2021-10-07 LAB — URINALYSIS, ROUTINE W REFLEX MICROSCOPIC
Bilirubin Urine: NEGATIVE
Glucose, UA: 100 mg/dL — AB
Ketones, ur: 40 mg/dL — AB
Leukocytes,Ua: NEGATIVE
Nitrite: NEGATIVE
Protein, ur: 100 mg/dL — AB
Specific Gravity, Urine: 1.015 (ref 1.005–1.030)
pH: 8.5 — ABNORMAL HIGH (ref 5.0–8.0)

## 2021-10-07 LAB — URINALYSIS, MICROSCOPIC (REFLEX)

## 2021-10-07 LAB — CK: Total CK: 47 U/L (ref 38–234)

## 2021-10-07 LAB — TROPONIN I (HIGH SENSITIVITY)
Troponin I (High Sensitivity): 3 ng/L (ref <18)
Troponin I (High Sensitivity): 3 ng/L (ref <18)

## 2021-10-07 LAB — LACTIC ACID, PLASMA
Lactic Acid, Venous: 2.3 mmol/L (ref 0.5–1.9)
Lactic Acid, Venous: 1.7 mmol/L (ref 0.5–1.9)

## 2021-10-07 LAB — CBG MONITORING, ED: Glucose-Capillary: 202 mg/dL — ABNORMAL HIGH (ref 70–99)

## 2021-10-07 LAB — POC URINE PREG, ED: Preg Test, Ur: NEGATIVE

## 2021-10-07 MED ORDER — PANTOPRAZOLE SODIUM 40 MG PO TBEC: 40.0000 mg | DELAYED_RELEASE_TABLET | Freq: Every day | ORAL | 1 refills | Status: DC

## 2021-10-07 MED ORDER — LACTATED RINGERS IV BOLUS
1000.0000 mL | Freq: Once | INTRAVENOUS | Status: AC
  Administered 2021-10-07: 1000 mL via INTRAVENOUS

## 2021-10-07 MED ORDER — LORAZEPAM 2 MG/ML IJ SOLN
2.0000 mg | Freq: Once | INTRAMUSCULAR | Status: AC
  Administered 2021-10-07: 2 mg via INTRAVENOUS
  Filled 2021-10-07: qty 1

## 2021-10-07 MED ORDER — ONDANSETRON 4 MG PO TBDP: 4.0000 mg | ORAL_TABLET | Freq: Three times a day (TID) | ORAL | 0 refills | Status: AC | PRN

## 2021-10-07 MED ORDER — SODIUM CHLORIDE 0.9 % IV BOLUS
1000.0000 mL | Freq: Once | INTRAVENOUS | Status: AC
  Administered 2021-10-07: 1000 mL via INTRAVENOUS

## 2021-10-07 MED ORDER — DROPERIDOL 2.5 MG/ML IJ SOLN
1.2500 mg | Freq: Once | INTRAMUSCULAR | Status: AC
  Administered 2021-10-07: 1.25 mg via INTRAVENOUS
  Filled 2021-10-07: qty 2

## 2021-10-07 MED ORDER — SUCRALFATE 1 G PO TABS: 1.0000 g | ORAL_TABLET | Freq: Four times a day (QID) | ORAL | 0 refills | Status: DC

## 2021-10-07 MED ORDER — IOHEXOL 300 MG/ML  SOLN
100.0000 mL | Freq: Once | INTRAMUSCULAR | Status: AC | PRN
  Administered 2021-10-07: 75 mL via INTRAVENOUS

## 2021-10-07 MED ORDER — LORAZEPAM 2 MG/ML IJ SOLN
1.0000 mg | Freq: Once | INTRAMUSCULAR | Status: AC
  Administered 2021-10-07: 1 mg via INTRAVENOUS
  Filled 2021-10-07: qty 1

## 2021-10-07 NOTE — ED Provider Notes (Signed)
[facility] Provider Note    Event Date/Time   First MD Initiated Contact with Patient 10/07/21 [phone]     (approximate)   History   Seizures   HPI  Rachael Gray is a 41 y.o. female with history of seizures, pseudoseizures, diabetes, lupus who comes in with seizure-like activity.  According to family she has had seizure-like activity since 7 AM.  Denies any falls or hitting her head.  Patient was brought in by EMS was given 2.5 of IV Versed with somewhat improvement but still having the seizure-like activity.  Patient is able to talk to the seizures.  She denies any chest pain, abdominal pain or any other concerns.  On review of records she was seen on 10/03/2021 with very similar activities  Physical Exam   Triage Vital Signs: Blood pressure 138/81, pulse (!) 125, temperature 98.6 F (37 C), temperature source Oral, resp. rate 16, height '5\' 4"'$  (1.626 m), weight 57 kg, SpO2 99 %.  Most recent vital signs: Vitals:   10/07/21 0946  BP: 138/81  Pulse: (!) 125  Resp: 16  Temp: 98.6 F (37 C)  SpO2: 99%     General: Awake, no distress.  CV:  Good peripheral perfusion.  Resp:  Normal effort.  Abd:  No distention.  Other:  Patient has some jerking noted in her bilateral arms and legs.  When I asked her to try to stop it she starts jerking more.  She is able to talk and lift both legs and squeeze bilateral hands.  No cranial nerve deficits are noted   ED Results / Procedures / Treatments   Labs (all labs ordered are listed, but only abnormal results are displayed) Labs Reviewed  COMPREHENSIVE METABOLIC PANEL - Abnormal; Notable for the following components:      Result Value   Potassium 3.4 (*)    Glucose, Bld 195 (*)    AST 14 (*)    All other components within normal limits  CBG MONITORING, ED - Abnormal; Notable for the following components:   Glucose-Capillary 202 (*)    All other components within normal limits  CBC WITH  DIFFERENTIAL/PLATELET  CK  LACTIC ACID, PLASMA  LACTIC ACID, PLASMA  TROPONIN I (HIGH SENSITIVITY)     EKG  My interpretation of EKG:  Heart rate is 121 without any ST elevation although a lot of artifact is noted and there was some concern for possible atrial flutter.  We will get a repeat once patient has stopped shaking  Sinus tachycardia rate of 105 without any ST elevation or T wave inversions, normal intervals  RADIOLOGY  Reviewed CT head and personally interpreted from 2017 without any intracranial hemorrhage.  None   PROCEDURES:  Critical Care performed: No  .1-3 Lead EKG Interpretation  Performed by: Vanessa [place], MD Authorized by: Vanessa [place], MD     Interpretation: normal     ECG rate:  100   ECG rate assessment: tachycardic     Rhythm: sinus tachycardia     Ectopy: none     Conduction: normal      MEDICATIONS ORDERED IN ED: Medications  LORazepam (ATIVAN) injection 1 mg (1 mg Intravenous Given 10/07/21 1018)  sodium chloride 0.9 % bolus 1,000 mL (1,000 mLs Intravenous New Bag/Given 10/07/21 1018)     IMPRESSION / MDM / ASSESSMENT AND PLAN / ED COURSE  I reviewed the triage vital signs and the nursing notes.   Patient's presentation is most consistent  with acute presentation with potential threat to life or bodily function.   Patient has significant jerking throughout her body that looks like a pseudoseizure and she can answer questions and talk during them.  She has had prior ER visits previously with similar that it thought to be secondary to pseudoseizures.  However she does have a history of diabetes so labs to be checked to evaluate for DKA, Electra abnormalities, AKI.  Glucose was 202.  CBC reassuring.  CMP slightly low potassium.  CK normal..  Lactate was slightly elevated  After the milligram of Ativan patient was still having the shaking but still completely with it and talking with me  Patient was given another 2 mg of Ativan which she  is now resting comfortably in bed.  I would suspect that this was not a real epileptic seizure her CK and lactate would be higher elevated.  She did have an episode of vomiting but she does have a lot of history of gastroparesis.  She had multiple CTs in the past and her last CT was on 11/07/2020 that showed some gastritis.  12:19 PM patient is resting more comfortably in bed.  1:45 PM reevaluated patient she reports feeling better but having a little bit of upset stomach related to her gastroparesis.  She reports that this is very similar to what she had previously.  She declines CT imaging of her abdomen.  We discussed if she has had any injuries to her head and she states no.  Declined CT of her head as well.  Patient requesting Dilaudid for pain.  Explained to patient that does not typically use unless there is a new acute finding noted in the meantime we will give a dose of droperidol.  Patient stated that she would like to go home.  Given she did have a little bit of a white count we will get urine first to ensure no UTI before sending patient home.  Patient continues to have vomiting and abdominal pain.  Given her elevated white count patient would like to proceed with CT imaging to rule out any kind of intracranial mass, abdominal obstruction  Pregnancy test was negative.  Lactate is downtrended to normal.  Repeat troponin is negative UA without evidence of UTI.  Patient handed off to oncoming team pending CT imaging.  Patient was given a dose of droperidol to help facilitate and suspect patient will be able to discharge home if CTs are reassuring  The patient is on the cardiac monitor to evaluate for evidence of arrhythmia and/or significant heart rate changes.      FINAL CLINICAL IMPRESSION(S) / ED DIAGNOSES   Final diagnoses:  Seizure-like activity ([place])  Gastroparesis     Rx / DC Orders   ED Discharge Orders     None        Note:  This document was prepared using Dragon  voice recognition software and may include unintentional dictation errors.   Vanessa [place], MD 10/07/21 [phone]

## 2021-10-07 NOTE — ED Provider Notes (Signed)
-----------------------------------------   4:06 PM on 10/07/2021 ----------------------------------------- Patient care assumed from Dr. Jari Pigg.  Patient's work-up overall reassuring.  Lab work reassuring including a negative troponin, some dehydration on urinalysis otherwise normal, mild leukocytosis of 15,000 otherwise normal CBC with a reassuring chemistry.  Patient CT scans have resulted showing negative head CT.  CT scan abdomen/pelvis does show possible gastritis which very likely explains the patient's symptoms here today.  We will place the patient on Protonix as well as sucralfate have the patient follow-up with GI medicine for further evaluation and possible endoscopy.  Patient agreeable to plan of care.   Harvest Dark, MD 10/07/21 [phone]

## 2021-10-07 NOTE — ED Notes (Signed)
Pt ambulated to toilet. Pt needed assistance to help prevent her from stumbling and falling. Pt then helped back to bed.

## 2021-10-07 NOTE — ED Notes (Signed)
Pt wheeled to lobby in wheelchair, given blanket, ride not present, first nurse notified that pt is waiting for ride. First nurse to assist pt to car as needed.

## 2021-10-07 NOTE — ED Notes (Signed)
Pt very sleepy, unable to wake up completely for dc at this time, EDP states to wait one hour and attempt dc again once pt is awake.

## 2021-10-07 NOTE — ED Notes (Signed)
Pt was pulling at seizure pads. I asked her to stop. Pt stopped rocking back and forth and stated "why". I told her she must have the seizure pads on the bed for safety. Pt then started back rocking back and forth

## 2021-10-07 NOTE — ED Notes (Signed)
Pt sleeping at this time.

## 2021-10-07 NOTE — ED Notes (Signed)
Pt is vomiting at this time. Md aware.

## 2021-10-07 NOTE — ED Notes (Signed)
This RN accompanied pt to CT,.no seizure activity noted during CT. Pt hooked up to bp and O2 monitors, pt tolerating at this time.

## 2021-10-07 NOTE — ED Notes (Signed)
Pt alert, ambulating to toilet by self, gait steady. Pt given paper scrub pants, briefs and socks due to pt's clothes being wet. Pt answering questions without difficulty. Pt dressing self. Pt's mother called to pick up pt.

## 2021-10-07 NOTE — ED Triage Notes (Addendum)
BIB [facility] from home. Pt is epileptic and diabetic. Having seizures since 0700 per family. Off seizure meds since 2 months.  Pt able to speak and maintain airway. Pt is rocking back and forth but is able to answer questions and follow all commands. EMS advised they administered total dose of 2.5 mg of versed at 0925 via IV. Pt stopped seizing after first dose. Vitals for EMS as follows: 160/90 98% RA 126 HR  BGL 160 126 ST 20 G RFA Paramedic the pulled nurse to the side and stated using air quotes "Pt started seizing again and I gave the patient a "second dose of 2.'5mg'$  of versed. Told the patient she was giving her a second dose of seizure medications but told the nurse it was normal saline instead". Pt stopped seizing after second dose".

## 2021-10-07 NOTE — ED Notes (Addendum)
Pt assisted to toilet. While sitting on toilet pt was spitting on the floor. Pt is CAOx4. Pt walked back to bed. Pt had already urinated on herself and no specimen  collected. Will attempt again later.

## 2021-10-07 NOTE — ED Notes (Addendum)
RN to bedside to answer call bell. Pt has pulled off cardiac leads. She states "I dont want to wear these" Pt was not rocking back and forth when I entered the room. But once I entered the room pt rocking back and forth.

## 2021-10-07 NOTE — Discharge Instructions (Addendum)
Your work-up is reassuring this could be nonepileptic seizures but given your history you should follow-up with your neurologist to discuss if you need to be restarted on your seizure medicine.  We have prescribed some Zofran to help with nausea and you return to the ER if you develop worsening symptoms or any other concerns.  We discussed CT imaging but given this has been happening previously we have elected to hold off  Please call the number provided for GI medicine to arrange a follow-up appointment for further evaluation and consideration of an endoscopy.  Your CT scan shows signs of gastritis inflammation of the stomach and possibly first part of the esophagus.  Please take your medications as prescribed including Protonix each morning for the next 2 months and sucralfate before each meal and before bedtime for the next 2 weeks.  Return to the emergency department for any worsening pain vomiting or any other symptom personally concerning to yourself.

## 2021-10-23 DIAGNOSIS — Z419 Encounter for procedure for purposes other than remedying health state, unspecified: Secondary | ICD-10-CM | POA: Diagnosis not present

## 2021-11-23 DIAGNOSIS — Z419 Encounter for procedure for purposes other than remedying health state, unspecified: Secondary | ICD-10-CM | POA: Diagnosis not present

## 2021-12-13 DIAGNOSIS — R404 Transient alteration of awareness: Secondary | ICD-10-CM | POA: Diagnosis not present

## 2021-12-13 DIAGNOSIS — Z743 Need for continuous supervision: Secondary | ICD-10-CM | POA: Diagnosis not present

## 2021-12-13 DIAGNOSIS — R739 Hyperglycemia, unspecified: Secondary | ICD-10-CM | POA: Diagnosis not present

## 2021-12-13 DIAGNOSIS — R000 Tachycardia, unspecified: Secondary | ICD-10-CM | POA: Diagnosis not present

## 2021-12-13 DIAGNOSIS — R569 Unspecified convulsions: Secondary | ICD-10-CM | POA: Diagnosis not present

## 2021-12-14 ENCOUNTER — Emergency Department
Admission: EM | Admit: 2021-12-14 | Discharge: 2021-12-14 | Disposition: A | Discharge status: Home / Self Care | Payer: Medicaid Other | Attending: Emergency Medicine | Admitting: Emergency Medicine

## 2021-12-14 DIAGNOSIS — R109 Unspecified abdominal pain: Secondary | ICD-10-CM | POA: Diagnosis not present

## 2021-12-14 DIAGNOSIS — D72829 Elevated white blood cell count, unspecified: Secondary | ICD-10-CM | POA: Diagnosis not present

## 2021-12-14 DIAGNOSIS — R000 Tachycardia, unspecified: Secondary | ICD-10-CM | POA: Diagnosis not present

## 2021-12-14 DIAGNOSIS — R1115 Cyclical vomiting syndrome unrelated to migraine: Secondary | ICD-10-CM | POA: Insufficient documentation

## 2021-12-14 DIAGNOSIS — G8929 Other chronic pain: Secondary | ICD-10-CM | POA: Diagnosis not present

## 2021-12-14 DIAGNOSIS — R7989 Other specified abnormal findings of blood chemistry: Secondary | ICD-10-CM | POA: Insufficient documentation

## 2021-12-14 DIAGNOSIS — R110 Nausea: Secondary | ICD-10-CM | POA: Insufficient documentation

## 2021-12-14 DIAGNOSIS — R258 Other abnormal involuntary movements: Secondary | ICD-10-CM | POA: Insufficient documentation

## 2021-12-14 DIAGNOSIS — R569 Unspecified convulsions: Secondary | ICD-10-CM

## 2021-12-14 LAB — COMPREHENSIVE METABOLIC PANEL
ALT: 8 U/L (ref 0–44)
AST: 15 U/L (ref 15–41)
Albumin: 4.4 g/dL (ref 3.5–5.0)
Alkaline Phosphatase: 39 U/L (ref 38–126)
Anion gap: 11 (ref 5–15)
BUN: 15 mg/dL (ref 6–20)
CO2: 25 mmol/L (ref 22–32)
Calcium: 9.7 mg/dL (ref 8.9–10.3)
Chloride: 101 mmol/L (ref 98–111)
Creatinine, Ser: 0.64 mg/dL (ref 0.44–1.00)
GFR, Estimated: >60 mL/min (ref >60)
Glucose, Bld: 262 mg/dL — ABNORMAL HIGH (ref 70–99)
Potassium: 3.7 mmol/L (ref 3.5–5.1)
Sodium: 137 mmol/L (ref 135–145)
Total Bilirubin: 0.8 mg/dL (ref 0.3–1.2)
Total Protein: 8.1 g/dL (ref 6.5–8.1)

## 2021-12-14 LAB — CBC WITH DIFFERENTIAL/PLATELET
Abs Immature Granulocytes: 0.06 10*3/uL (ref 0.00–0.07)
Basophils Absolute: 0.1 10*3/uL (ref 0.0–0.1)
Basophils Relative: 0 %
Eosinophils Absolute: 0.1 10*3/uL (ref 0.0–0.5)
Eosinophils Relative: 1 %
HCT: 38.8 % (ref 36.0–46.0)
Hemoglobin: 12.6 g/dL (ref 12.0–15.0)
Immature Granulocytes: 0 %
Lymphocytes Relative: 15 %
Lymphs Abs: 2.2 10*3/uL (ref 0.7–4.0)
MCH: 28.5 pg (ref 26.0–34.0)
MCHC: 32.5 g/dL (ref 30.0–36.0)
MCV: 87.8 fL (ref 80.0–100.0)
Monocytes Absolute: 0.8 10*3/uL (ref 0.1–1.0)
Monocytes Relative: 5 %
Neutro Abs: 11.9 10*3/uL — ABNORMAL HIGH (ref 1.7–7.7)
Neutrophils Relative %: 79 %
Platelets: 257 10*3/uL (ref 150–400)
RBC: 4.42 MIL/uL (ref 3.87–5.11)
RDW: 14.1 % (ref 11.5–15.5)
WBC: 15 10*3/uL — ABNORMAL HIGH (ref 4.0–10.5)
nRBC: 0 % (ref 0.0–0.2)

## 2021-12-14 LAB — LACTIC ACID, PLASMA
Lactic Acid, Venous: 3.2 mmol/L (ref 0.5–1.9)
Lactic Acid, Venous: 2.7 mmol/L (ref 0.5–1.9)

## 2021-12-14 LAB — LIPASE, BLOOD: Lipase: 34 U/L (ref 11–51)

## 2021-12-14 MED ORDER — LACTATED RINGERS IV BOLUS
1000.0000 mL | Freq: Once | INTRAVENOUS | Status: AC
  Administered 2021-12-14: 1000 mL via INTRAVENOUS

## 2021-12-14 MED ORDER — DROPERIDOL 2.5 MG/ML IJ SOLN
5.0000 mg | Freq: Once | INTRAMUSCULAR | Status: AC
  Administered 2021-12-14: 5 mg via INTRAVENOUS
  Filled 2021-12-14: qty 2

## 2021-12-14 MED ORDER — ONDANSETRON 4 MG PO TBDP: ORAL_TABLET | ORAL | 0 refills | Status: DC

## 2021-12-14 NOTE — ED Provider Notes (Addendum)
[facility] Provider Note    Event Date/Time   First MD Initiated Contact with Patient 12/14/21 0014     (approximate)   History   Seizures (Patient C/O focal seizure that lasted ten minutes per sister. Patient was on seizure meds until approx a month ago, dc'd by her Dr.)   HPI  Rachael Gray is a 41 y.o. female well-known to the emergency department for frequent visits for pseudoseizures and seizure-like activity as well as cyclic vomiting and chronic abdominal pain.  She also has a documented history of drug-seeking behavior and noncompliance with her medication regimen.  She presents tonight by EMS for evaluation of possible seizures.  She is having some shaking episodes, during which she is completely awake and alert and conversant.  She says it is happening because her abdomen hurts and states that Dilaudid usually helps.  She admits to daily marijuana use.  She has been having nausea and vomiting.  She said the symptoms have been going on for about 3 days.  She confirmed that this is similar to symptoms she has had multiple times in the past.  Of note, she states that her doctor at [facility] discontinued the [place] that she has been prescribed for an extended period of time.  She has not been taking it for about a month, although she admits that she did not take it regularly before that time.     Physical Exam   Triage Vital Signs: ED Triage Vitals  Enc Vitals Group     BP 12/14/21 0010 (!) 151/79     Pulse Rate 12/14/21 0010 (!) 117     Resp 12/14/21 0010 20     Temp 12/14/21 0010 98.6 F (37 C)     Temp Source 12/14/21 0010 Oral     SpO2 12/14/21 0010 98 %     Weight 12/14/21 0013 61.2 kg (135 lb)     Height --      Head Circumference --      Peak Flow --      Pain Score 12/14/21 0013 0     Pain Loc --      Pain Edu? --      Excl. in [place]? --     Most recent vital signs: Vitals:   12/14/21 0345 12/14/21 0355  BP:  (!) 151/82  Pulse:   100  Resp: 19 19  Temp:  98.6 F (37 C)  SpO2:  100%     General: Awake, mild distress because the patient is shaking her arms and legs back-and-forth and rocking back and forth in the bed but remains awake and alert, orienting on me, and answering my questions.  She states that she is having a seizure. CV:  Good peripheral perfusion.  Mild tachycardia. Resp:  Normal effort.  Lungs are clear to auscultation. Abd:  No distention.  Patient does not react when I palpate her abdomen although she reports severe abdominal pain. Other:  Patient is shaking back-and-forth as described above which she has apparently been doing for an extended period of time.  She seems to be able to minimize the symptoms when I asked her to, but claims that she cannot stop.   ED Results / Procedures / Treatments   Labs (all labs ordered are listed, but only abnormal results are displayed) Labs Reviewed  COMPREHENSIVE METABOLIC PANEL - Abnormal; Notable for the following components:      Result Value   Glucose, Bld 262 (*)  All other components within normal limits  CBC WITH DIFFERENTIAL/PLATELET - Abnormal; Notable for the following components:   WBC 15.0 (*)    Neutro Abs 11.9 (*)    All other components within normal limits  LACTIC ACID, PLASMA - Abnormal; Notable for the following components:   Lactic Acid, Venous 3.2 (*)    All other components within normal limits  LACTIC ACID, PLASMA - Abnormal; Notable for the following components:   Lactic Acid, Venous 2.7 (*)    All other components within normal limits  LIPASE, BLOOD     EKG  Reviewed prior EKG which shows no QTc prolongation.  ED ECG REPORT I, Hinda Kehr, the attending physician, personally viewed and interpreted this ECG.  Date: 12/14/2021 EKG Time: 3:52 AM Rate: 110 Rhythm: Sinus tachycardia QRS Axis: normal Intervals: normal ST/T Wave abnormalities: Non-specific ST segment / T-wave changes, but no clear evidence of acute  ischemia. Narrative Interpretation: no definitive evidence of acute ischemia; does not meet STEMI criteria.   RADIOLOGY No indication for emergent imaging    PROCEDURES:  Critical Care performed: No  .1-3 Lead EKG Interpretation  Performed by: Hinda Kehr, MD Authorized by: Hinda Kehr, MD     Interpretation: normal     ECG rate:  80   ECG rate assessment: normal     Rhythm: sinus rhythm     Ectopy: none     Conduction: normal      MEDICATIONS ORDERED IN ED: Medications  droperidol (INAPSINE) 2.5 MG/ML injection 5 mg (5 mg Intravenous Given 12/14/21 0044)  lactated ringers bolus 1,000 mL (0 mLs Intravenous Stopped 12/14/21 0356)  lactated ringers bolus 1,000 mL (0 mLs Intravenous Stopped 12/14/21 0356)     IMPRESSION / MDM / ASSESSMENT AND PLAN / ED COURSE  I reviewed the triage vital signs and the nursing notes.                              Differential diagnosis includes, but is not limited to, pseudoseizures, epileptic seizures, cyclic vomiting, cannabinoid hyperemesis, electrolyte or metabolic abnormalities, medication or drug side effects, side effects from discontinuing long-term Keppra.  Patient's presentation is most consistent with acute presentation with potential threat to life or bodily function.  I have seen this patient 4 times in the past for similar presentations.  I also reviewed other prior and more recent visits with similar presentation.  Dilaudid would not be therapeutic for her.  Haloperidol has worked very successful in the past, and tonight I will try droperidol 5 mg IV as both an antiemetic and a calming agent and a treatment for cyclic vomiting/cannabinoid hyperemesis syndrome.  Patient is mildly tachycardic but she is shaking and I anticipate that this will improve once her symptoms are treated.  I ordered LR 1 L IV bolus in addition to the droperidol.  The patient is on the cardiac monitor to evaluate for evidence of arrhythmia and/or  significant heart rate changes.  Labs ordered include CMP, CBC with differential, lipase, and lactic acid to serve as a baseline.  Anticipated will be slightly elevated given the seizure-like activity, but I do not anticipate that I will be extremely high, and I believe that will come down with IV fluids.   Clinical Course as of 12/14/21 0427  Fri Dec 14, 2021  0140 Lactic acid is elevated at 3.2, likely because of her hyperventilation as well as the pseudoseizure or seizure-like activity.  The  elevated lactic acid is not indicative of sepsis.    Now that she is calm and receiving 2 L of LR that I ordered, we will repeat, and I anticipate will come down.  Similarly the rest of her labs are normal except for leukocytosis of 15 which is likely stress related. [CF]  0423 Patient feels much better and is calling a ride to come pick her up.  Her lactate came down substantially.  Even though it is still elevated at 2.7, it is showing a trend downward, she is awake and alert, no longer vomiting or having any seizure-like activity, and is appropriate for discharge.  She seems to be back at her baseline.  I gave my usual customary follow-up recommendations and return precautions. [CF]    Clinical Course User Index [CF] Hinda Kehr, MD     FINAL CLINICAL IMPRESSION(S) / ED DIAGNOSES   Final diagnoses:  Cyclic vomiting syndrome  Seizure-like activity ([place])     Rx / DC Orders   ED Discharge Orders          Ordered    ondansetron (ZOFRAN-ODT) 4 MG disintegrating tablet        12/14/21 0423             Note:  This document was prepared using Dragon voice recognition software and may include unintentional dictation errors.   Hinda Kehr, MD 12/14/21 7654    Hinda Kehr, MD 12/14/21 [phone]

## 2021-12-23 DIAGNOSIS — Z419 Encounter for procedure for purposes other than remedying health state, unspecified: Secondary | ICD-10-CM | POA: Diagnosis not present

## 2022-01-23 DIAGNOSIS — Z419 Encounter for procedure for purposes other than remedying health state, unspecified: Secondary | ICD-10-CM | POA: Diagnosis not present

## 2022-02-22 DIAGNOSIS — Z419 Encounter for procedure for purposes other than remedying health state, unspecified: Secondary | ICD-10-CM | POA: Diagnosis not present

## 2022-03-25 DIAGNOSIS — Z419 Encounter for procedure for purposes other than remedying health state, unspecified: Secondary | ICD-10-CM | POA: Diagnosis not present

## 2022-04-25 DIAGNOSIS — Z419 Encounter for procedure for purposes other than remedying health state, unspecified: Secondary | ICD-10-CM | POA: Diagnosis not present

## 2022-05-24 DIAGNOSIS — Z419 Encounter for procedure for purposes other than remedying health state, unspecified: Secondary | ICD-10-CM | POA: Diagnosis not present

## 2022-06-02 ENCOUNTER — Other Ambulatory Visit: Payer: Self-pay

## 2022-06-02 ENCOUNTER — Encounter: Payer: Self-pay | Admitting: Emergency Medicine

## 2022-06-02 ENCOUNTER — Emergency Department
Admission: EM | Admit: 2022-06-02 | Discharge: 2022-06-02 | Disposition: A | Discharge status: Home / Self Care | Payer: Medicaid Other | Attending: Emergency Medicine | Admitting: Emergency Medicine

## 2022-06-02 DIAGNOSIS — R258 Other abnormal involuntary movements: Secondary | ICD-10-CM | POA: Diagnosis not present

## 2022-06-02 DIAGNOSIS — R000 Tachycardia, unspecified: Secondary | ICD-10-CM | POA: Diagnosis not present

## 2022-06-02 DIAGNOSIS — R569 Unspecified convulsions: Secondary | ICD-10-CM | POA: Diagnosis not present

## 2022-06-02 DIAGNOSIS — D72829 Elevated white blood cell count, unspecified: Secondary | ICD-10-CM | POA: Insufficient documentation

## 2022-06-02 DIAGNOSIS — G40909 Epilepsy, unspecified, not intractable, without status epilepticus: Secondary | ICD-10-CM | POA: Diagnosis not present

## 2022-06-02 DIAGNOSIS — R404 Transient alteration of awareness: Secondary | ICD-10-CM | POA: Diagnosis not present

## 2022-06-02 DIAGNOSIS — Z743 Need for continuous supervision: Secondary | ICD-10-CM | POA: Diagnosis not present

## 2022-06-02 LAB — BASIC METABOLIC PANEL
Anion gap: 12 (ref 5–15)
BUN: 18 mg/dL (ref 6–20)
CO2: 27 mmol/L (ref 22–32)
Calcium: 9.4 mg/dL (ref 8.9–10.3)
Chloride: 97 mmol/L — ABNORMAL LOW (ref 98–111)
Creatinine, Ser: 0.84 mg/dL (ref 0.44–1.00)
GFR, Estimated: >60 mL/min (ref >60)
Glucose, Bld: 326 mg/dL — ABNORMAL HIGH (ref 70–99)
Potassium: 3 mmol/L — ABNORMAL LOW (ref 3.5–5.1)
Sodium: 136 mmol/L (ref 135–145)

## 2022-06-02 LAB — CBC
HCT: 40.9 % (ref 36.0–46.0)
Hemoglobin: 13.5 g/dL (ref 12.0–15.0)
MCH: 28.7 pg (ref 26.0–34.0)
MCHC: 33 g/dL (ref 30.0–36.0)
MCV: 87 fL (ref 80.0–100.0)
Platelets: 238 10*3/uL (ref 150–400)
RBC: 4.7 MIL/uL (ref 3.87–5.11)
RDW: 12.6 % (ref 11.5–15.5)
WBC: 13.5 10*3/uL — ABNORMAL HIGH (ref 4.0–10.5)
nRBC: 0 % (ref 0.0–0.2)

## 2022-06-02 MED ORDER — DROPERIDOL 2.5 MG/ML IJ SOLN
2.5000 mg | Freq: Once | INTRAMUSCULAR | Status: AC
  Administered 2022-06-02: 2.5 mg via INTRAVENOUS
  Filled 2022-06-02: qty 2

## 2022-06-02 NOTE — ED Notes (Signed)
RN called mother for ride per pt's request. Mother stated her sister would come pick her up and only lives about 5 min away.

## 2022-06-02 NOTE — ED Triage Notes (Signed)
Presents via EMS for witnessed tonic -clonic seizure by mother today requiring 7.'5mg'$  total versed (2.'5mg'$  x2 IM, 2.'5mg'$  IV). Last seizurte 2 yrs ago, h/o seizures.  '4mg'$  zofran given as well 22R hand by EMS EMS vitals: 116HR, 92%RA (improved to 100% with 3L), 389CBG, 134/85  Not currently taking any medications for seizures currently.

## 2022-06-02 NOTE — ED Provider Notes (Signed)
[facility] Provider Note    Event Date/Time   First MD Initiated Contact with Patient 06/02/22 2005     (approximate)   History   Seizures   HPI  Rachael Gray is a 42 y.o. 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  Mother apparently witnessed generalized tonic-clonic seizure.  EMS gave 7.5 mg of Versed with improvement.  Patient is reportedly no longer on Keppra DC'd by her physician several months ago.     Physical Exam   Triage Vital Signs: ED Triage Vitals  Enc Vitals Group     BP 06/02/22 2009 (!) 152/102     Pulse Rate 06/02/22 2009 (!) 134     Resp 06/02/22 2009 14     Temp 06/02/22 2009 97.9 F (36.6 C)     Temp Source 06/02/22 2009 Axillary     SpO2 06/02/22 2009 96 %     Weight 06/02/22 2010 61 kg (134 lb 7.7 oz)     Height --      Head Circumference --      Peak Flow --      Pain Score 06/02/22 2009 9     Pain Loc --      Pain Edu? --      Excl. in [place]? --     Most recent vital signs: Vitals:   06/02/22 2230 06/02/22 2245  BP:  (!) 146/80  Pulse: 98 90  Resp:  20  Temp:    SpO2: 98% 97%     General: Awake, no distress.  CV:  Good peripheral perfusion.  Resp:  Normal effort.  Abd:  No distention.  Other:     ED Results / Procedures / Treatments   Labs (all labs ordered are listed, but only abnormal results are displayed) Labs Reviewed  CBC - Abnormal; Notable for the following components:      Result Value   WBC 13.5 (*)    All other components within normal limits  BASIC METABOLIC PANEL - Abnormal; Notable for the following components:   Potassium 3.0 (*)    Chloride 97 (*)    Glucose, Bld 326 (*)    All other components within normal limits     EKG     RADIOLOGY     PROCEDURES:  Critical Care performed:   Procedures   MEDICATIONS ORDERED IN ED: Medications  droperidol  (INAPSINE) 2.5 MG/ML injection 2.5 mg (2.5 mg Intravenous Given by Other 06/02/22 2020)     IMPRESSION / MDM / [place] / ED COURSE  I reviewed the triage vital signs and the nursing notes. Patient's presentation is most consistent with exacerbation of chronic illness.  Patient presents with reported seizure-like activity, overall well-appearing here slightly drowsy from Versed given by EMS.  Lab work reviewed and is overall unremarkable, mild elevation of white blood cell count is nonspecific likely related to seizure-like activity.  No evidence of injury on exam.  Normal neuroexam  Patient reports she thinks she may have another seizure, has had significant improvement with droperidol given in the past, so IV droperidol given once patient on cardiac monitor  ----------------------------------------- 10:55 PM on 06/02/2022 ----------------------------------------- Patient reports she is feeling much better and would like to be discharged home, no indication for admission at this time, return precautions discussed.        FINAL CLINICAL IMPRESSION(S) / ED DIAGNOSES   Final diagnoses:  Seizure-like activity ([place])     Rx / DC Orders   ED Discharge Orders     None        Note:  This document was prepared using Dragon voice recognition software and may include unintentional dictation errors.   Lavonia Drafts, MD 06/02/22 2255

## 2022-06-02 NOTE — ED Notes (Signed)
Pt resting comfortably at bedside with eyes closed.  RN observed symmetrical rise and fall of chest without labored breathing.  No signs of respiratory distress noted. See vitals

## 2022-06-03 ENCOUNTER — Other Ambulatory Visit: Payer: Self-pay

## 2022-06-03 ENCOUNTER — Inpatient Hospital Stay
Admission: EM | Admit: 2022-06-03 | Discharge: 2022-06-06 | DRG: 639 | Disposition: A | Discharge status: Home / Self Care | Payer: Medicaid Other | Attending: Internal Medicine | Admitting: Internal Medicine

## 2022-06-03 ENCOUNTER — Observation Stay: Payer: Medicaid Other

## 2022-06-03 DIAGNOSIS — E1069 Type 1 diabetes mellitus with other specified complication: Secondary | ICD-10-CM

## 2022-06-03 DIAGNOSIS — K3184 Gastroparesis: Secondary | ICD-10-CM | POA: Diagnosis present

## 2022-06-03 DIAGNOSIS — F1290 Cannabis use, unspecified, uncomplicated: Secondary | ICD-10-CM | POA: Diagnosis present

## 2022-06-03 DIAGNOSIS — Z743 Need for continuous supervision: Secondary | ICD-10-CM | POA: Diagnosis not present

## 2022-06-03 DIAGNOSIS — Z720 Tobacco use: Secondary | ICD-10-CM | POA: Diagnosis not present

## 2022-06-03 DIAGNOSIS — I959 Hypotension, unspecified: Secondary | ICD-10-CM | POA: Diagnosis not present

## 2022-06-03 DIAGNOSIS — E876 Hypokalemia: Secondary | ICD-10-CM | POA: Diagnosis not present

## 2022-06-03 DIAGNOSIS — Z888 Allergy status to other drugs, medicaments and biological substances status: Secondary | ICD-10-CM

## 2022-06-03 DIAGNOSIS — Z886 Allergy status to analgesic agent status: Secondary | ICD-10-CM

## 2022-06-03 DIAGNOSIS — E1110 Type 2 diabetes mellitus with ketoacidosis without coma: Secondary | ICD-10-CM | POA: Diagnosis not present

## 2022-06-03 DIAGNOSIS — Z794 Long term (current) use of insulin: Secondary | ICD-10-CM

## 2022-06-03 DIAGNOSIS — D72829 Elevated white blood cell count, unspecified: Secondary | ICD-10-CM | POA: Diagnosis not present

## 2022-06-03 DIAGNOSIS — R569 Unspecified convulsions: Secondary | ICD-10-CM | POA: Diagnosis not present

## 2022-06-03 DIAGNOSIS — Z1152 Encounter for screening for COVID-19: Secondary | ICD-10-CM

## 2022-06-03 DIAGNOSIS — G43A0 Cyclical vomiting, not intractable: Secondary | ICD-10-CM | POA: Diagnosis not present

## 2022-06-03 DIAGNOSIS — K2100 Gastro-esophageal reflux disease with esophagitis, without bleeding: Secondary | ICD-10-CM | POA: Diagnosis present

## 2022-06-03 DIAGNOSIS — R000 Tachycardia, unspecified: Secondary | ICD-10-CM | POA: Diagnosis not present

## 2022-06-03 DIAGNOSIS — E1165 Type 2 diabetes mellitus with hyperglycemia: Secondary | ICD-10-CM | POA: Diagnosis present

## 2022-06-03 DIAGNOSIS — R1116 Cannabis hyperemesis syndrome: Secondary | ICD-10-CM | POA: Diagnosis present

## 2022-06-03 DIAGNOSIS — R1115 Cyclical vomiting syndrome unrelated to migraine: Secondary | ICD-10-CM

## 2022-06-03 DIAGNOSIS — T383X6A Underdosing of insulin and oral hypoglycemic [antidiabetic] drugs, initial encounter: Secondary | ICD-10-CM | POA: Diagnosis present

## 2022-06-03 DIAGNOSIS — Z91199 Patient's noncompliance with other medical treatment and regimen due to unspecified reason: Secondary | ICD-10-CM

## 2022-06-03 DIAGNOSIS — E1143 Type 2 diabetes mellitus with diabetic autonomic (poly)neuropathy: Secondary | ICD-10-CM | POA: Diagnosis present

## 2022-06-03 DIAGNOSIS — F17210 Nicotine dependence, cigarettes, uncomplicated: Secondary | ICD-10-CM | POA: Diagnosis present

## 2022-06-03 DIAGNOSIS — Z79899 Other long term (current) drug therapy: Secondary | ICD-10-CM

## 2022-06-03 DIAGNOSIS — E860 Dehydration: Secondary | ICD-10-CM | POA: Diagnosis not present

## 2022-06-03 DIAGNOSIS — K2980 Duodenitis without bleeding: Secondary | ICD-10-CM | POA: Diagnosis present

## 2022-06-03 DIAGNOSIS — Z881 Allergy status to other antibiotic agents status: Secondary | ICD-10-CM

## 2022-06-03 DIAGNOSIS — F445 Conversion disorder with seizures or convulsions: Secondary | ICD-10-CM

## 2022-06-03 DIAGNOSIS — Z803 Family history of malignant neoplasm of breast: Secondary | ICD-10-CM

## 2022-06-03 DIAGNOSIS — R112 Nausea with vomiting, unspecified: Secondary | ICD-10-CM | POA: Diagnosis present

## 2022-06-03 DIAGNOSIS — T426X6A Underdosing of other antiepileptic and sedative-hypnotic drugs, initial encounter: Secondary | ICD-10-CM | POA: Diagnosis present

## 2022-06-03 DIAGNOSIS — R410 Disorientation, unspecified: Secondary | ICD-10-CM | POA: Diagnosis not present

## 2022-06-03 DIAGNOSIS — M329 Systemic lupus erythematosus, unspecified: Secondary | ICD-10-CM | POA: Diagnosis present

## 2022-06-03 DIAGNOSIS — Z91018 Allergy to other foods: Secondary | ICD-10-CM

## 2022-06-03 LAB — GLUCOSE, CAPILLARY
Glucose-Capillary: 102 mg/dL — ABNORMAL HIGH (ref 70–99)
Glucose-Capillary: 143 mg/dL — ABNORMAL HIGH (ref 70–99)
Glucose-Capillary: 147 mg/dL — ABNORMAL HIGH (ref 70–99)
Glucose-Capillary: 155 mg/dL — ABNORMAL HIGH (ref 70–99)
Glucose-Capillary: 193 mg/dL — ABNORMAL HIGH (ref 70–99)
Glucose-Capillary: 205 mg/dL — ABNORMAL HIGH (ref 70–99)
Glucose-Capillary: 344 mg/dL — ABNORMAL HIGH (ref 70–99)
Glucose-Capillary: 89 mg/dL (ref 70–99)

## 2022-06-03 LAB — MRSA NEXT GEN BY PCR, NASAL: MRSA by PCR Next Gen: NOT DETECTED

## 2022-06-03 LAB — COMPREHENSIVE METABOLIC PANEL WITH GFR
ALT: 11 U/L (ref 0–44)
AST: 27 U/L (ref 15–41)
Albumin: 4.8 g/dL (ref 3.5–5.0)
Alkaline Phosphatase: 57 U/L (ref 38–126)
Anion gap: 20 — ABNORMAL HIGH (ref 5–15)
BUN: 19 mg/dL (ref 6–20)
CO2: 23 mmol/L (ref 22–32)
Calcium: 9.9 mg/dL (ref 8.9–10.3)
Chloride: 91 mmol/L — ABNORMAL LOW (ref 98–111)
Creatinine, Ser: 0.97 mg/dL (ref 0.44–1.00)
GFR, Estimated: >60 mL/min
Glucose, Bld: 351 mg/dL — ABNORMAL HIGH (ref 70–99)
Potassium: 2.9 mmol/L — ABNORMAL LOW (ref 3.5–5.1)
Sodium: 134 mmol/L — ABNORMAL LOW (ref 135–145)
Total Bilirubin: 0.8 mg/dL (ref 0.3–1.2)
Total Protein: 9.1 g/dL — ABNORMAL HIGH (ref 6.5–8.1)

## 2022-06-03 LAB — CBC WITH DIFFERENTIAL/PLATELET
Abs Immature Granulocytes: 0.09 10*3/uL — ABNORMAL HIGH (ref 0.00–0.07)
Basophils Absolute: 0 10*3/uL (ref 0.0–0.1)
Basophils Relative: 0 %
Eosinophils Absolute: 0 10*3/uL (ref 0.0–0.5)
Eosinophils Relative: 0 %
HCT: 42.8 % (ref 36.0–46.0)
Hemoglobin: 14.2 g/dL (ref 12.0–15.0)
Immature Granulocytes: 1 %
Lymphocytes Relative: 9 %
Lymphs Abs: 1.5 10*3/uL (ref 0.7–4.0)
MCH: 28.8 pg (ref 26.0–34.0)
MCHC: 33.2 g/dL (ref 30.0–36.0)
MCV: 86.8 fL (ref 80.0–100.0)
Monocytes Absolute: 0.6 10*3/uL (ref 0.1–1.0)
Monocytes Relative: 3 %
Neutro Abs: 16 10*3/uL — ABNORMAL HIGH (ref 1.7–7.7)
Neutrophils Relative %: 87 %
Platelets: 256 10*3/uL (ref 150–400)
RBC: 4.93 MIL/uL (ref 3.87–5.11)
RDW: 12.6 % (ref 11.5–15.5)
WBC: 18.2 10*3/uL — ABNORMAL HIGH (ref 4.0–10.5)
nRBC: 0 % (ref 0.0–0.2)

## 2022-06-03 LAB — URINALYSIS, W/ REFLEX TO CULTURE (INFECTION SUSPECTED)
Bacteria, UA: NONE SEEN
Bilirubin Urine: NEGATIVE
Glucose, UA: >=500 mg/dL — AB
Ketones, ur: 20 mg/dL — AB
Leukocytes,Ua: NEGATIVE
Nitrite: NEGATIVE
Protein, ur: >=300 mg/dL — AB
Specific Gravity, Urine: 1.027 (ref 1.005–1.030)
pH: 5 (ref 5.0–8.0)

## 2022-06-03 LAB — URINE DRUG SCREEN, QUALITATIVE (ARMC ONLY)
Amphetamines, Ur Screen: NOT DETECTED
Barbiturates, Ur Screen: NOT DETECTED
Benzodiazepine, Ur Scrn: POSITIVE — AB
Cannabinoid 50 Ng, Ur ~~LOC~~: POSITIVE — AB
Cocaine Metabolite,Ur ~~LOC~~: NOT DETECTED
MDMA (Ecstasy)Ur Screen: NOT DETECTED
Methadone Scn, Ur: NOT DETECTED
Opiate, Ur Screen: NOT DETECTED
Phencyclidine (PCP) Ur S: NOT DETECTED
Tricyclic, Ur Screen: NOT DETECTED

## 2022-06-03 LAB — BASIC METABOLIC PANEL
Anion gap: 15 (ref 5–15)
Anion gap: 12 (ref 5–15)
BUN: 17 mg/dL (ref 6–20)
BUN: 15 mg/dL (ref 6–20)
CO2: 22 mmol/L (ref 22–32)
CO2: 26 mmol/L (ref 22–32)
Calcium: 8.8 mg/dL — ABNORMAL LOW (ref 8.9–10.3)
Calcium: 8.8 mg/dL — ABNORMAL LOW (ref 8.9–10.3)
Chloride: 98 mmol/L (ref 98–111)
Chloride: 98 mmol/L (ref 98–111)
Creatinine, Ser: 0.76 mg/dL (ref 0.44–1.00)
Creatinine, Ser: 0.64 mg/dL (ref 0.44–1.00)
GFR, Estimated: >60 mL/min (ref >60)
GFR, Estimated: >60 mL/min (ref >60)
Glucose, Bld: 346 mg/dL — ABNORMAL HIGH (ref 70–99)
Glucose, Bld: 188 mg/dL — ABNORMAL HIGH (ref 70–99)
Potassium: 3.4 mmol/L — ABNORMAL LOW (ref 3.5–5.1)
Potassium: 3 mmol/L — ABNORMAL LOW (ref 3.5–5.1)
Sodium: 135 mmol/L (ref 135–145)
Sodium: 136 mmol/L (ref 135–145)

## 2022-06-03 LAB — RESP PANEL BY RT-PCR (RSV, FLU A&B, COVID)  RVPGX2
Influenza A by PCR: NEGATIVE
Influenza B by PCR: NEGATIVE
Resp Syncytial Virus by PCR: NEGATIVE
SARS Coronavirus 2 by RT PCR: NEGATIVE

## 2022-06-03 LAB — ACETAMINOPHEN LEVEL: Acetaminophen (Tylenol), Serum: <10 ug/mL — ABNORMAL LOW (ref 10–30)

## 2022-06-03 LAB — BETA-HYDROXYBUTYRIC ACID: Beta-Hydroxybutyric Acid: 2.1 mmol/L — ABNORMAL HIGH (ref 0.05–0.27)

## 2022-06-03 LAB — HIV ANTIBODY (ROUTINE TESTING W REFLEX): HIV Screen 4th Generation wRfx: NONREACTIVE

## 2022-06-03 LAB — MAGNESIUM: Magnesium: 2 mg/dL (ref 1.7–2.4)

## 2022-06-03 LAB — PHOSPHORUS: Phosphorus: 3 mg/dL (ref 2.5–4.6)

## 2022-06-03 LAB — ETHANOL: Alcohol, Ethyl (B): <10 mg/dL

## 2022-06-03 LAB — LIPASE, BLOOD: Lipase: 31 U/L (ref 11–51)

## 2022-06-03 LAB — CBG MONITORING, ED: Glucose-Capillary: 330 mg/dL — ABNORMAL HIGH (ref 70–99)

## 2022-06-03 LAB — POC URINE PREG, ED: Preg Test, Ur: NEGATIVE

## 2022-06-03 LAB — SALICYLATE LEVEL: Salicylate Lvl: <7 mg/dL — ABNORMAL LOW (ref 7.0–30.0)

## 2022-06-03 MED ORDER — LORAZEPAM 2 MG/ML IJ SOLN
1.0000 mg | Freq: Once | INTRAMUSCULAR | Status: AC
  Administered 2022-06-03: 1 mg via INTRAVENOUS
  Filled 2022-06-03: qty 1

## 2022-06-03 MED ORDER — DEXTROSE IN LACTATED RINGERS 5 % IV SOLN: INTRAVENOUS | Status: DC

## 2022-06-03 MED ORDER — DEXTROSE 50 % IV SOLN: 0.0000 mL | INTRAVENOUS | Status: DC | PRN

## 2022-06-03 MED ORDER — SUCRALFATE 1 G PO TABS
1.0000 g | ORAL_TABLET | Freq: Four times a day (QID) | ORAL | Status: DC
  Administered 2022-06-03 – 2022-06-06 (×8): 1 g via ORAL
  Filled 2022-06-03 (×10): qty 1

## 2022-06-03 MED ORDER — FENTANYL CITRATE PF 50 MCG/ML IJ SOSY
50.0000 ug | PREFILLED_SYRINGE | Freq: Once | INTRAMUSCULAR | Status: AC
  Administered 2022-06-03: 50 ug via INTRAVENOUS
  Filled 2022-06-03: qty 1

## 2022-06-03 MED ORDER — HALOPERIDOL LACTATE 5 MG/ML IJ SOLN
2.5000 mg | Freq: Once | INTRAMUSCULAR | Status: AC
  Administered 2022-06-03: 2.5 mg via INTRAVENOUS
  Filled 2022-06-03: qty 1

## 2022-06-03 MED ORDER — IBUPROFEN 400 MG PO TABS: 200.0000 mg | ORAL_TABLET | Freq: Four times a day (QID) | ORAL | Status: DC | PRN

## 2022-06-03 MED ORDER — KETOROLAC TROMETHAMINE 15 MG/ML IJ SOLN
15.0000 mg | Freq: Once | INTRAMUSCULAR | Status: AC
  Administered 2022-06-03: 15 mg via INTRAVENOUS
  Filled 2022-06-03: qty 1

## 2022-06-03 MED ORDER — ONDANSETRON HCL 4 MG/2ML IJ SOLN
4.0000 mg | Freq: Three times a day (TID) | INTRAMUSCULAR | Status: DC | PRN
  Administered 2022-06-04 – 2022-06-05 (×2): 4 mg via INTRAVENOUS
  Filled 2022-06-03 (×2): qty 2

## 2022-06-03 MED ORDER — ENOXAPARIN SODIUM 40 MG/0.4ML IJ SOSY
40.0000 mg | PREFILLED_SYRINGE | INTRAMUSCULAR | Status: DC
  Administered 2022-06-03 – 2022-06-05 (×3): 40 mg via SUBCUTANEOUS
  Filled 2022-06-03 (×3): qty 0.4

## 2022-06-03 MED ORDER — PANTOPRAZOLE SODIUM 40 MG IV SOLR
40.0000 mg | Freq: Once | INTRAVENOUS | Status: AC
  Administered 2022-06-03: 40 mg via INTRAVENOUS
  Filled 2022-06-03: qty 10

## 2022-06-03 MED ORDER — ONDANSETRON HCL 4 MG/2ML IJ SOLN
4.0000 mg | Freq: Once | INTRAMUSCULAR | Status: AC
  Administered 2022-06-03: 4 mg via INTRAVENOUS
  Filled 2022-06-03: qty 2

## 2022-06-03 MED ORDER — LEVETIRACETAM IN NACL 1000 MG/100ML IV SOLN
1000.0000 mg | INTRAVENOUS | Status: AC
  Administered 2022-06-03: 1000 mg via INTRAVENOUS
  Filled 2022-06-03: qty 100

## 2022-06-03 MED ORDER — LACTATED RINGERS IV SOLN: INTRAVENOUS | Status: DC

## 2022-06-03 MED ORDER — SODIUM CHLORIDE 0.9 % IV SOLN
750.0000 mg | Freq: Two times a day (BID) | INTRAVENOUS | Status: DC
  Administered 2022-06-03 – 2022-06-04 (×2): 750 mg via INTRAVENOUS
  Filled 2022-06-03 (×2): qty 7.5

## 2022-06-03 MED ORDER — NICOTINE 21 MG/24HR TD PT24
21.0000 mg | MEDICATED_PATCH | Freq: Every day | TRANSDERMAL | Status: DC
  Filled 2022-06-03 (×3): qty 1

## 2022-06-03 MED ORDER — POTASSIUM CHLORIDE 10 MEQ/100ML IV SOLN
10.0000 meq | INTRAVENOUS | Status: AC
  Administered 2022-06-03 (×4): 10 meq via INTRAVENOUS
  Filled 2022-06-03 (×2): qty 100
  Filled 2022-06-03: qty 300
  Filled 2022-06-03: qty 100

## 2022-06-03 MED ORDER — LORAZEPAM 2 MG/ML IJ SOLN
2.0000 mg | INTRAMUSCULAR | Status: DC | PRN
  Administered 2022-06-03 – 2022-06-05 (×2): 2 mg via INTRAVENOUS
  Filled 2022-06-03 (×3): qty 1

## 2022-06-03 MED ORDER — POTASSIUM CHLORIDE CRYS ER 20 MEQ PO TBCR
40.0000 meq | EXTENDED_RELEASE_TABLET | Freq: Once | ORAL | Status: AC
  Administered 2022-06-03: 40 meq via ORAL
  Filled 2022-06-03: qty 2

## 2022-06-03 MED ORDER — INSULIN REGULAR(HUMAN) IN NACL 100-0.9 UT/100ML-% IV SOLN
INTRAVENOUS | Status: DC
  Filled 2022-06-03 (×2): qty 100

## 2022-06-03 MED ORDER — CHLORHEXIDINE GLUCONATE CLOTH 2 % EX PADS
6.0000 | MEDICATED_PAD | Freq: Every day | CUTANEOUS | Status: DC
  Administered 2022-06-03 – 2022-06-04 (×2): 6 via TOPICAL

## 2022-06-03 MED ORDER — INSULIN REGULAR(HUMAN) IN NACL 100-0.9 UT/100ML-% IV SOLN: INTRAVENOUS | Status: DC

## 2022-06-03 MED ORDER — SODIUM CHLORIDE 0.9 % IV BOLUS
1000.0000 mL | Freq: Once | INTRAVENOUS | Status: AC
  Administered 2022-06-03: 1000 mL via INTRAVENOUS

## 2022-06-03 MED ORDER — LACTATED RINGERS IV BOLUS
1000.0000 mL | Freq: Once | INTRAVENOUS | Status: AC
  Administered 2022-06-03: 1000 mL via INTRAVENOUS

## 2022-06-03 MED ORDER — POTASSIUM CHLORIDE 10 MEQ/100ML IV SOLN
10.0000 meq | INTRAVENOUS | Status: AC
  Administered 2022-06-03 – 2022-06-04 (×4): 10 meq via INTRAVENOUS
  Filled 2022-06-03 (×4): qty 100

## 2022-06-03 MED ORDER — MAGNESIUM SULFATE 2 GM/50ML IV SOLN
2.0000 g | Freq: Once | INTRAVENOUS | Status: AC
  Administered 2022-06-03: 2 g via INTRAVENOUS
  Filled 2022-06-03: qty 50

## 2022-06-03 MED ORDER — PANTOPRAZOLE SODIUM 40 MG IV SOLR
40.0000 mg | Freq: Two times a day (BID) | INTRAVENOUS | Status: DC
  Administered 2022-06-03: 40 mg via INTRAVENOUS
  Filled 2022-06-03: qty 10

## 2022-06-03 MED ORDER — INSULIN REGULAR(HUMAN) IN NACL 100-0.9 UT/100ML-% IV SOLN
INTRAVENOUS | Status: DC
  Administered 2022-06-03: 12 [IU]/h via INTRAVENOUS
  Filled 2022-06-03: qty 100

## 2022-06-03 MED ORDER — ORAL CARE MOUTH RINSE: 15.0000 mL | OROMUCOSAL | Status: DC | PRN

## 2022-06-03 MED ORDER — METOCLOPRAMIDE HCL 5 MG/ML IJ SOLN
5.0000 mg | Freq: Three times a day (TID) | INTRAMUSCULAR | Status: DC
  Administered 2022-06-03 – 2022-06-04 (×3): 5 mg via INTRAVENOUS
  Filled 2022-06-03 (×3): qty 2

## 2022-06-03 NOTE — ED Notes (Signed)
Seizure pads placed on pt side rails. Suction set up. Seizure precautions maintained.

## 2022-06-03 NOTE — ED Notes (Addendum)
Pt requesting pain medication, MD [facility] notified. Unable to obtain BP d/t pt constant movement.

## 2022-06-03 NOTE — ED Triage Notes (Signed)
Pt arrived with complaint of seizures. Pt had multiple seizures since last night. Pt states she stopped taking her keppra and has not been to the dr for new medications due to insurance. Pt endorses abdominal pain on palpation. Pt states she has also been vomiting. Pt and episode of projectile vomit upon arrival. Pt is A&Ox4. Pt is shaking continuously and states she does not know why.

## 2022-06-03 NOTE — ED Notes (Addendum)
Pt first IV bag of fluids still running through IV slowly due to pt movement and not keeping arm straight. MD [facility] aware.

## 2022-06-03 NOTE — ED Provider Notes (Signed)
[facility] Provider Note    Event Date/Time   First MD Initiated Contact with Patient 06/03/22 1019     (approximate)   History   Chief Complaint: Seizures   HPI  Rachael Gray is a 42 y.o. female with a history of diabetes, gastroparesis, lupus, cannabinol hyperemesis syndrome, seizures versus pseudoseizures, polysubstance abuse who comes ED complaining of multiple seizures this morning.  Reports she was seen in the ED yesterday for seizure, discharged home, then this morning had 3 additional episodes lasting 10 to 15 seconds.  Reports she used to be on Keppra but has not been on it in many months due to not following up.  Also complains of her "whole body" hurting, nausea and vomiting.  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  No other physician follow-up within the last year.     Physical Exam   Triage Vital Signs: ED Triage Vitals  Enc Vitals Group     BP 06/03/22 1025 (!) 159/96     Pulse Rate 06/03/22 1025 (!) 146     Resp 06/03/22 1025 (!) 36     Temp 06/03/22 1025 98 F (36.7 C)     Temp src --      SpO2 06/03/22 1025 99 %     Weight 06/03/22 1037 134 lb 7.7 oz (61 kg)     Height 06/03/22 1026 '5\' 4"'$  (1.626 m)     Head Circumference --      Peak Flow --      Pain Score 06/03/22 1026 7     Pain Loc --      Pain Edu? --      Excl. in [place]? --     Most recent vital signs: Vitals:   06/03/22 1400 06/03/22 1500  BP: (!) 153/79 (!) 165/88  Pulse: 97 99  Resp: (!) 21 20  Temp:    SpO2:      General: Awake, no distress.  Normal mental status CV:  Good peripheral perfusion.  Tachycardia heart rate 140 Resp:  Normal effort.  Clear to auscultation bilaterally Abd:  No distention.  Soft without focal tenderness Other:  Small-volume dark emesis in emesis bag.  Dry mucous membranes   ED Results / Procedures / Treatments   Labs (all labs ordered are  listed, but only abnormal results are displayed) Labs Reviewed  COMPREHENSIVE METABOLIC PANEL - Abnormal; Notable for the following components:      Result Value   Sodium 134 (*)    Potassium 2.9 (*)    Chloride 91 (*)    Glucose, Bld 351 (*)    Total Protein 9.1 (*)    Anion gap 20 (*)    All other components within normal limits  ACETAMINOPHEN LEVEL - Abnormal; Notable for the following components:   Acetaminophen (Tylenol), Serum <10 (*)    All other components within normal limits  SALICYLATE LEVEL - Abnormal; Notable for the following components:   Salicylate Lvl [id number] (*)    All other components within normal limits  CBC WITH DIFFERENTIAL/PLATELET - Abnormal; Notable for the following components:   WBC 18.2 (*)    Neutro Abs 16.0 (*)    Abs Immature Granulocytes 0.09 (*)    All other components within normal limits  URINALYSIS, W/ REFLEX TO CULTURE (INFECTION SUSPECTED) - Abnormal; Notable for the following components:   Color, Urine YELLOW (*)  APPearance CLEAR (*)    Glucose, UA >=500 (*)    Hgb urine dipstick MODERATE (*)    Ketones, ur 20 (*)    Protein, ur >=300 (*)    All other components within normal limits  URINE DRUG SCREEN, QUALITATIVE ([facility] ONLY) - Abnormal; Notable for the following components:   Cannabinoid 50 Ng, Ur  POSITIVE (*)    Benzodiazepine, Ur Scrn POSITIVE (*)    All other components within normal limits  BETA-HYDROXYBUTYRIC ACID - Abnormal; Notable for the following components:   Beta-Hydroxybutyric Acid 2.10 (*)    All other components within normal limits  RESP PANEL BY RT-PCR (RSV, FLU A&B, COVID)  [id number]  ETHANOL  LIPASE, BLOOD  POC URINE PREG, ED  CBG MONITORING, ED     EKG    RADIOLOGY    PROCEDURES:  Angiocath insertion  Date/Time: 06/03/2022 11:03 AM  Performed by: Carrie Mew, MD Authorized by: Carrie Mew, MD  Consent: Verbal consent obtained. Risks and benefits: risks, benefits and alternatives were  discussed Consent given by: patient Patient identity confirmed: verbally with patient Preparation: Patient was prepped and draped in the usual sterile fashion. Local anesthesia used: no  Anesthesia: Local anesthesia used: no  Sedation: Patient sedated: no  Patient tolerance: patient tolerated the procedure well with no immediate complications Comments: Continuous [place] visualization, 1 attempt, EBL 32m. Necessary for me to place PIV due to multiple failed attempts by nursing       .1-3 Lead EKG Interpretation  Performed by: SCarrie Mew MD Authorized by: SCarrie Mew MD     Interpretation: abnormal     ECG rate:  130   ECG rate assessment: tachycardic     Rhythm: sinus tachycardia     Ectopy: none     Conduction: normal   .Critical Care  Performed by: SCarrie Mew MD Authorized by: SCarrie Mew MD   Critical care provider statement:    Critical care time (minutes):  35   Critical care time was exclusive of:  Separately billable procedures and treating other patients   Critical care was necessary to treat or prevent imminent or life-threatening deterioration of the following conditions:  Dehydration and metabolic crisis   Critical care was time spent personally by me on the following activities:  Development of treatment plan with patient or surrogate, discussions with consultants, evaluation of patient's response to treatment, examination of patient, obtaining history from patient or surrogate, ordering and performing treatments and interventions, ordering and review of laboratory studies, ordering and review of radiographic studies, pulse oximetry, re-evaluation of patient's condition and review of old charts   Care discussed with: admitting provider      MEDICATIONS ORDERED IN ED: Medications  insulin regular, human (MYXREDLIN) 100 units/ 100 mL infusion (has no administration in time range)  lactated ringers infusion (has no administration in time  range)  dextrose 5 % in lactated ringers infusion (0 mLs Intravenous Hold 06/03/22 1514)  dextrose 50 % solution 0-50 mL (has no administration in time range)  potassium chloride 10 mEq in 100 mL IVPB (10 mEq Intravenous New Bag/Given 06/03/22 1522)  sodium chloride 0.9 % bolus 1,000 mL (1,000 mLs Intravenous New Bag/Given 06/03/22 1102)  ondansetron (ZOFRAN) injection 4 mg (4 mg Intravenous Given 06/03/22 1101)  ketorolac (TORADOL) 15 MG/ML injection 15 mg (15 mg Intravenous Given 06/03/22 1107)  pantoprazole (PROTONIX) injection 40 mg (40 mg Intravenous Given 06/03/22 1102)  levETIRAcetam (KEPPRA) IVPB 1000 mg/100 mL premix (0 mg Intravenous Stopped 06/03/22 1118)  haloperidol lactate (HALDOL) injection 2.5 mg (2.5 mg Intravenous Given 06/03/22 1233)  lactated ringers bolus 1,000 mL (1,000 mLs Intravenous New Bag/Given 06/03/22 1521)     IMPRESSION / MDM / ASSESSMENT AND PLAN / ED COURSE  I reviewed the triage vital signs and the nursing notes.  DDx: Cannabinoid hyperemesis, dehydration, AKI, electrolyte abnormality, anemia, upper GI bleed, Mallory-Weiss, gastritis  Patient's presentation is most consistent with acute presentation with potential threat to life or bodily function.  Patient presents with nausea vomiting, multiple seizure-like episodes.  Currently mental status is normal and no signs of trauma or neurologic complaints.  She has vague complaints but expresses them as severe.  Will give IV fluids, antiemetics and anti-inflammatories.  Will check labs.  Unable to obtain useable EKG due to pt rhythmically moving her body in a voluntary way creating too much artifact.    Clinical Course as of 06/03/22 1525  Mon Jun 03, 2022  1331 Labs show hypokalemia of 2.9.  Also a metabolic acidosis with elevated anion gap of 20.  Glucose is 350.  Will need to obtain urinalysis or beta hydroxybutyrate to check for ketones, possibly DKA as a source of her symptoms. [PS]  [id number] Labs consistent with DKA,  moderate ketones on urinalysis and beta hydroxy.  Patient still feeling very nauseated and tachycardic, also only intermittently compliant with her insulin 70/30 at home, would benefit from continued hospital management. [PS]    Clinical Course User Index [PS] Carrie Mew, MD     FINAL CLINICAL IMPRESSION(S) / ED DIAGNOSES   Final diagnoses:  Type 2 diabetes mellitus with ketoacidosis without coma, with long-term current use of insulin ([place])  Cyclical vomiting     Rx / DC Orders   ED Discharge Orders     None        Note:  This document was prepared using Dragon voice recognition software and may include unintentional dictation errors.   Carrie Mew, MD 06/03/22 1525

## 2022-06-03 NOTE — ED Notes (Signed)
Unable to obtain IV access. MD Joni Fears notified, pt needs [place] IV.

## 2022-06-03 NOTE — H&P (Addendum)
History and Physical    Rachael Gray [id number] DOB: 09/29/1980 DOA: 06/03/2022  Referring MD/NP/PA:   [facility]   Patient coming from:  The patient is coming from home.  At baseline, pt is independent for most of ADL.        Chief Complaint: Seizure and shaking,  HPI: Rachael Gray is a 42 y.o. female with medical history significant of diabetes mellitus, gastroparesis, cannabinoidal hyperemesis syndrome, GERD, anxiety, seizure/pseudoseizure, lupus, tobacco abuse, duodenitis, esophagitis, GERD,  anemia, DKA, substance abuse, who presents with seizure and  shaking.  Pt states that she had 3- 4 seizures at home, described as "tonic-clonic seizure" by patient.  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  She is supposed to take Keppra 750 mg twice daily, but she has not taken this medication for more than half year.  Pt is constantly shaking back-and-forth in bed in the ED.  No seizure activity during the interview.  States that when she has pain, she keeps shaking her body. Patient states that she has nausea, vomiting and abdominal pain.  She has multiple episodes of bilious vomiting.  No diarrhea.  Her abdominal pain is diffuse, constant, severe, nonradiating.  Denies symptoms of UTI.  No fever or chills.  No chest pain, cough, shortness breath.  Data reviewed independently and ED Course: pt was found to have early stage mild DKA (blood sugar 351, anion gap 20, beta hydroxybutyric acid 2.10, positive ketones in urinalysis, bicarbonate of 23), urinalysis negative for UTI, liver function normal, lipase 31, WBC 18.2, potassium 2.9, GFR> 60, negative PCR for COVID, flu and RSV.  UDS positive for benzo and cannabinoid.  Temperature normal, blood pressure 133/79, heart rate of 146, 111, RR 39, 26, oxygen saturation 99% on room air.  Patient is placed in stepdown for observation.  EKG: Not done in ED, will get one.     Review of Systems:   General: no fevers,  chills, no body weight gain, has poor appetite, has fatigue HEENT: no blurry vision, hearing changes or sore throat Respiratory: no dyspnea, coughing, wheezing CV: no chest pain, no palpitations GI: has nausea, vomiting, abdominal pain, no diarrhea, constipation GU: no dysuria, burning on urination, increased urinary frequency, hematuria  Ext: no leg edema Neuro: no unilateral weakness, numbness, or tingling, no vision change or hearing loss. Has seizure Skin: no rash, no skin tear. MSK: No muscle spasm, no deformity, no limitation of range of movement in spin Heme: No easy bruising.  Travel history: No recent long distant travel.   Allergy:  Allergies  Allergen Reactions   Dilantin [Phenytoin Sodium Extended] Hives   Doxycycline Swelling   Other    Tomato Rash   Tylenol [Acetaminophen] Rash    Past Medical History:  Diagnosis Date   Collagen vascular disease ([place])    Diabetes mellitus without complication ([place])    Gastroparesis    Lupus ([place])    Pseudoseizures    Seizures ([place])     Past Surgical History:  Procedure Laterality Date   APPENDECTOMY     CHOLECYSTECTOMY     ESOPHAGOGASTRODUODENOSCOPY N/A 06/28/2020   Procedure: ESOPHAGOGASTRODUODENOSCOPY (EGD);  Surgeon: Virgel Manifold, MD;  Location: [facility] ENDOSCOPY;  Service: Endoscopy;  Laterality: N/A;    Social History:  reports that she has been smoking cigarettes. She has a 5.00 pack-year smoking history. She has never used smokeless tobacco. She reports current drug use. Drug: Marijuana. She reports that she does not drink alcohol.  Family  History:  Family History  Problem Relation Age of Onset   Breast cancer Maternal Grandmother      Prior to Admission medications   Medication Sig Start Date End Date Taking? Authorizing Provider  cephALEXin (KEFLEX) 500 MG capsule Take 1 capsule (500 mg total) by mouth 3 (three) times daily. Patient not taking: Reported on 06/03/2022 11/07/20   Harvest Dark, MD   insulin NPH-regular Human (NOVOLIN 70/30) (70-30) 100 UNIT/ML injection Inject 10 units under the skin twice daily with breakfast and dinner Patient not taking: Reported on 06/03/2022 01/28/20   Ezekiel Slocumb, DO  metoCLOPramide (REGLAN) 10 MG tablet Take 1 tablet (10 mg total) by mouth every 8 (eight) hours as needed. Patient not taking: Reported on 03/06/2017 02/25/17   Loney Hering, MD  metoprolol tartrate (LOPRESSOR) 25 MG tablet Take 0.5 tablets (12.5 mg total) by mouth 2 (two) times daily. 06/30/20 07/30/20  Nolberto Hanlon, MD  ondansetron (ZOFRAN-ODT) 4 MG disintegrating tablet Allow 1-2 tablets to dissolve in your mouth every 8 hours as needed for nausea/vomiting Patient not taking: Reported on 06/03/2022 12/14/21   Hinda Kehr, MD  pantoprazole (PROTONIX) 40 MG tablet Take 1 tablet (40 mg total) by mouth daily. Patient not taking: Reported on 06/03/2022 10/07/21 10/07/22  Harvest Dark, MD  sucralfate (CARAFATE) 1 g tablet Take 1 tablet (1 g total) by mouth 4 (four) times daily for 15 days. 10/07/21 10/22/21  Harvest Dark, MD    Physical Exam: Vitals:   06/03/22 1400 06/03/22 1500 06/03/22 1600 06/03/22 1627  BP: (!) 153/79 (!) 165/88    Pulse: 97 99  (!) 104  Resp: (!) '21 20  16  '$ Temp:    98.3 F (36.8 C)  TempSrc:    Oral  SpO2:    97%  Weight:   58.5 kg   Height:   '5\' 5"'$  (1.651 m)    General: pt is anxious, constantly shaking her body, in moderate acute distress HEENT:       Eyes: PERRL, EOMI, no scleral icterus.       ENT: No discharge from the ears and nose, no pharynx injection, no tonsillar enlargement.        Neck: No JVD, no bruit, no mass felt. Heme: No neck lymph node enlargement. Cardiac: S1/S2, RRR, No murmurs, No gallops or rubs. Respiratory: No rales, wheezing, rhonchi or rubs. GI: Soft, nondistended, has central and epigastric tenderness, no rebound pain, no organomegaly, BS present. GU: No hematuria Ext: No pitting leg edema bilaterally. 1+DP/PT  pulse bilaterally. Musculoskeletal: No joint deformities, No joint redness or warmth, no limitation of ROM in spin. Skin: No rashes.  Neuro: Alert, oriented X3, cranial nerves II-XII grossly intact, moves all extremities. Psych: Patient is not psychotic, no suicidal or hemocidal ideation.  Labs on Admission: I have personally reviewed following labs and imaging studies  CBC: Recent Labs  Lab 06/02/22 2015 06/03/22 1058  WBC 13.5* 18.2*  NEUTROABS  --  16.0*  HGB 13.5 14.2  HCT 40.9 42.8  MCV 87.0 86.8  PLT 238 [id number]   Basic Metabolic Panel: Recent Labs  Lab 06/02/22 2015 06/03/22 1058  NA 136 134*  K 3.0* 2.9*  CL 97* 91*  CO2 27 23  GLUCOSE 326* 351*  BUN 18 19  CREATININE 0.84 0.97  CALCIUM 9.4 9.9   GFR: Estimated Creatinine Clearance: 69.4 mL/min (by C-G formula based on SCr of 0.97 mg/dL). Liver Function Tests: Recent Labs  Lab 06/03/22 1058  AST 27  ALT 11  ALKPHOS 57  BILITOT 0.8  PROT 9.1*  ALBUMIN 4.8   Recent Labs  Lab 06/03/22 1058  LIPASE 31   No results for input(s): "AMMONIA" in the last 168 hours. Coagulation Profile: No results for input(s): "INR", "PROTIME" in the last 168 hours. Cardiac Enzymes: No results for input(s): "CKTOTAL", "CKMB", "CKMBINDEX", "TROPONINI" in the last 168 hours. BNP (last 3 results) No results for input(s): "PROBNP" in the last 8760 hours. HbA1C: No results for input(s): "HGBA1C" in the last 72 hours. CBG: Recent Labs  Lab 06/03/22 1607 06/03/22 1633  GLUCAP 330* 344*   Lipid Profile: No results for input(s): "CHOL", "HDL", "LDLCALC", "TRIG", "CHOLHDL", "LDLDIRECT" in the last 72 hours. Thyroid Function Tests: No results for input(s): "TSH", "T4TOTAL", "FREET4", "T3FREE", "THYROIDAB" in the last 72 hours. Anemia Panel: No results for input(s): "VITAMINB12", "FOLATE", "FERRITIN", "TIBC", "IRON", "RETICCTPCT" in the last 72 hours. Urine analysis:    Component Value Date/Time   COLORURINE YELLOW (A)  06/03/2022 1330   APPEARANCEUR CLEAR (A) 06/03/2022 1330   APPEARANCEUR HAZY 06/15/2014 2100   LABSPEC 1.027 06/03/2022 1330   LABSPEC 1.018 06/15/2014 2100   PHURINE 5.0 06/03/2022 1330   GLUCOSEU >=500 (A) 06/03/2022 1330   GLUCOSEU >=500 mg/dL 06/15/2014 2100   HGBUR MODERATE (A) 06/03/2022 1330   BILIRUBINUR NEGATIVE 06/03/2022 1330   BILIRUBINUR NEGATIVE 06/15/2014 2100   KETONESUR 20 (A) 06/03/2022 1330   PROTEINUR >=300 (A) 06/03/2022 1330   UROBILINOGEN 1.0 02/09/2008 1519   NITRITE NEGATIVE 06/03/2022 1330   LEUKOCYTESUR NEGATIVE 06/03/2022 1330   LEUKOCYTESUR TRACE 06/15/2014 2100   Sepsis Labs: '@LABRCNTIP'$ (procalcitonin:4,lacticidven:4) ) Recent Results (from the past 240 hour(s))  Resp panel by RT-PCR (RSV, Flu A&B, Covid) Anterior Nasal Swab     Status: None   Collection Time: 06/03/22 10:26 AM   Specimen: Anterior Nasal Swab  Result Value Ref Range Status   SARS Coronavirus 2 by RT PCR NEGATIVE NEGATIVE Final    Comment: (NOTE) SARS-CoV-2 target nucleic acids are NOT DETECTED.  The SARS-CoV-2 RNA is generally detectable in upper respiratory specimens during the acute phase of infection. The lowest concentration of SARS-CoV-2 viral copies this assay can detect is 138 copies/mL. A negative result does not preclude SARS-Cov-2 infection and should not be used as the sole basis for treatment or other patient management decisions. A negative result may occur with  improper specimen collection/handling, submission of specimen other than nasopharyngeal swab, presence of viral mutation(s) within the areas targeted by this assay, and inadequate number of viral copies(<138 copies/mL). A negative result must be combined with clinical observations, patient history, and epidemiological information. The expected result is Negative.  Fact Sheet for Patients:  EntrepreneurPulse.com.au  Fact Sheet for Healthcare Providers:   IncredibleEmployment.be  This test is no t yet approved or cleared by the [place] FDA and  has been authorized for detection and/or diagnosis of SARS-CoV-2 by FDA under an Emergency Use Authorization (EUA). This EUA will remain  in effect (meaning this test can be used) for the duration of the COVID-19 declaration under Section 564(b)(1) of the Act, 21 U.S.C.section 360bbb-3(b)(1), unless the authorization is terminated  or revoked sooner.       Influenza A by PCR NEGATIVE NEGATIVE Final   Influenza B by PCR NEGATIVE NEGATIVE Final    Comment: (NOTE) The Xpert Xpress SARS-CoV-2/FLU/RSV plus assay is intended as an aid in the diagnosis of influenza from Nasopharyngeal swab specimens and should not be used as a sole basis  for treatment. Nasal washings and aspirates are unacceptable for Xpert Xpress SARS-CoV-2/FLU/RSV testing.  Fact Sheet for Patients: EntrepreneurPulse.com.au  Fact Sheet for Healthcare Providers: IncredibleEmployment.be  This test is not yet approved or cleared by the [place] FDA and has been authorized for detection and/or diagnosis of SARS-CoV-2 by FDA under an Emergency Use Authorization (EUA). This EUA will remain in effect (meaning this test can be used) for the duration of the COVID-19 declaration under Section 564(b)(1) of the Act, 21 U.S.C. section 360bbb-3(b)(1), unless the authorization is terminated or revoked.     Resp Syncytial Virus by PCR NEGATIVE NEGATIVE Final    Comment: (NOTE) Fact Sheet for Patients: EntrepreneurPulse.com.au  Fact Sheet for Healthcare Providers: IncredibleEmployment.be  This test is not yet approved or cleared by the [place] FDA and has been authorized for detection and/or diagnosis of SARS-CoV-2 by FDA under an Emergency Use Authorization (EUA). This EUA will remain in effect (meaning this test can be used) for  the duration of the COVID-19 declaration under Section 564(b)(1) of the Act, 21 U.S.C. section 360bbb-3(b)(1), unless the authorization is terminated or revoked.  Performed at [facility], [address] [place], [place] [address]      Radiological Exams on Admission: No results found.    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  This is most likely due to medication noncompliance.  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  Patient is supposed to take NPH insulin 10 units twice daily, but she is not taking medications for long time -On DKA protocol now  Gastroparesis due to DM ([place]) and cannabinoid hyperemesis syndrome: Patient's nausea vomiting and abdominal pain are most likely due to multifactorial etiology, including history of duodenitis, gastroparesis, cannabinoid hyperemesis syndrome. 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  Likely reactive -Follow-up with CBC    DVT ppx: SQ Lovenox  Code Status: Full code  Family Communication: not done, no family member is at bed side.    Disposition Plan:  Anticipate discharge back to previous environment  Consults called:  none  Admission status and Level of care: Stepdown:    for obs   Dispo: The patient is from: Home              Anticipated d/c is to: Home              Anticipated d/c date is: 1 day              Patient currently is not medically stable to d/c.    Severity of Illness:  The appropriate patient status for this patient is INPATIENT. Inpatient status is judged to be reasonable and necessary in order to provide the required intensity of service to ensure the patient's safety. The patient's presenting symptoms, physical exam findings, and initial radiographic and laboratory data in the context of their chronic comorbidities is felt to place them at high risk for further clinical deterioration. Furthermore, it is not anticipated that the patient will be medically stable for discharge from the hospital within 2 midnights of admission.   * I certify that at the point of admission it is my clinical judgment that the patient will require inpatient hospital care spanning beyond 2 midnights from the point of admission due to high intensity of service, high risk for further deterioration and high frequency of surveillance required.*       Date of Service 06/03/2022    Ivor Costa [facility]   If  7PM-7AM, please contact night-coverage [email or web address] 06/03/2022, 5:03 PM

## 2022-06-04 ENCOUNTER — Observation Stay: Payer: Medicaid Other

## 2022-06-04 DIAGNOSIS — T383X6A Underdosing of insulin and oral hypoglycemic [antidiabetic] drugs, initial encounter: Secondary | ICD-10-CM | POA: Diagnosis not present

## 2022-06-04 DIAGNOSIS — K2100 Gastro-esophageal reflux disease with esophagitis, without bleeding: Secondary | ICD-10-CM | POA: Diagnosis present

## 2022-06-04 DIAGNOSIS — Z1152 Encounter for screening for COVID-19: Secondary | ICD-10-CM | POA: Diagnosis not present

## 2022-06-04 DIAGNOSIS — E1010 Type 1 diabetes mellitus with ketoacidosis without coma: Secondary | ICD-10-CM | POA: Diagnosis not present

## 2022-06-04 DIAGNOSIS — T426X6A Underdosing of other antiepileptic and sedative-hypnotic drugs, initial encounter: Secondary | ICD-10-CM | POA: Diagnosis present

## 2022-06-04 DIAGNOSIS — K3184 Gastroparesis: Secondary | ICD-10-CM | POA: Diagnosis present

## 2022-06-04 DIAGNOSIS — F17210 Nicotine dependence, cigarettes, uncomplicated: Secondary | ICD-10-CM | POA: Diagnosis not present

## 2022-06-04 DIAGNOSIS — R112 Nausea with vomiting, unspecified: Secondary | ICD-10-CM | POA: Diagnosis present

## 2022-06-04 DIAGNOSIS — R000 Tachycardia, unspecified: Secondary | ICD-10-CM | POA: Diagnosis not present

## 2022-06-04 DIAGNOSIS — G43A0 Cyclical vomiting, not intractable: Secondary | ICD-10-CM | POA: Diagnosis not present

## 2022-06-04 DIAGNOSIS — Z881 Allergy status to other antibiotic agents status: Secondary | ICD-10-CM | POA: Diagnosis not present

## 2022-06-04 DIAGNOSIS — F1290 Cannabis use, unspecified, uncomplicated: Secondary | ICD-10-CM | POA: Diagnosis present

## 2022-06-04 DIAGNOSIS — Z79899 Other long term (current) drug therapy: Secondary | ICD-10-CM | POA: Diagnosis not present

## 2022-06-04 DIAGNOSIS — R1115 Cyclical vomiting syndrome unrelated to migraine: Secondary | ICD-10-CM | POA: Diagnosis not present

## 2022-06-04 DIAGNOSIS — E1110 Type 2 diabetes mellitus with ketoacidosis without coma: Secondary | ICD-10-CM | POA: Diagnosis not present

## 2022-06-04 DIAGNOSIS — Z91199 Patient's noncompliance with other medical treatment and regimen due to unspecified reason: Secondary | ICD-10-CM | POA: Diagnosis not present

## 2022-06-04 DIAGNOSIS — M329 Systemic lupus erythematosus, unspecified: Secondary | ICD-10-CM | POA: Diagnosis not present

## 2022-06-04 DIAGNOSIS — R569 Unspecified convulsions: Secondary | ICD-10-CM | POA: Diagnosis not present

## 2022-06-04 DIAGNOSIS — E1143 Type 2 diabetes mellitus with diabetic autonomic (poly)neuropathy: Secondary | ICD-10-CM | POA: Diagnosis not present

## 2022-06-04 DIAGNOSIS — Z794 Long term (current) use of insulin: Secondary | ICD-10-CM | POA: Diagnosis not present

## 2022-06-04 DIAGNOSIS — Z803 Family history of malignant neoplasm of breast: Secondary | ICD-10-CM | POA: Diagnosis not present

## 2022-06-04 DIAGNOSIS — I700 Atherosclerosis of aorta: Secondary | ICD-10-CM | POA: Diagnosis not present

## 2022-06-04 DIAGNOSIS — Z886 Allergy status to analgesic agent status: Secondary | ICD-10-CM | POA: Diagnosis not present

## 2022-06-04 DIAGNOSIS — K2980 Duodenitis without bleeding: Secondary | ICD-10-CM | POA: Diagnosis present

## 2022-06-04 DIAGNOSIS — R109 Unspecified abdominal pain: Secondary | ICD-10-CM | POA: Diagnosis not present

## 2022-06-04 DIAGNOSIS — Z888 Allergy status to other drugs, medicaments and biological substances status: Secondary | ICD-10-CM | POA: Diagnosis not present

## 2022-06-04 DIAGNOSIS — D72829 Elevated white blood cell count, unspecified: Secondary | ICD-10-CM | POA: Diagnosis not present

## 2022-06-04 DIAGNOSIS — E876 Hypokalemia: Secondary | ICD-10-CM | POA: Diagnosis not present

## 2022-06-04 LAB — BASIC METABOLIC PANEL
Anion gap: 10 (ref 5–15)
Anion gap: 14 (ref 5–15)
Anion gap: 7 (ref 5–15)
BUN: 11 mg/dL (ref 6–20)
BUN: 12 mg/dL (ref 6–20)
BUN: 15 mg/dL (ref 6–20)
CO2: 22 mmol/L (ref 22–32)
CO2: 29 mmol/L (ref 22–32)
CO2: 31 mmol/L (ref 22–32)
Calcium: 8.3 mg/dL — ABNORMAL LOW (ref 8.9–10.3)
Calcium: 8.8 mg/dL — ABNORMAL LOW (ref 8.9–10.3)
Calcium: 8.9 mg/dL (ref 8.9–10.3)
Chloride: 101 mmol/L (ref 98–111)
Chloride: 98 mmol/L (ref 98–111)
Chloride: 99 mmol/L (ref 98–111)
Creatinine, Ser: 0.61 mg/dL (ref 0.44–1.00)
Creatinine, Ser: 0.61 mg/dL (ref 0.44–1.00)
Creatinine, Ser: 0.63 mg/dL (ref 0.44–1.00)
GFR, Estimated: >60 mL/min (ref >60)
GFR, Estimated: >60 mL/min (ref >60)
GFR, Estimated: >60 mL/min (ref >60)
Glucose, Bld: 119 mg/dL — ABNORMAL HIGH (ref 70–99)
Glucose, Bld: 154 mg/dL — ABNORMAL HIGH (ref 70–99)
Glucose, Bld: 156 mg/dL — ABNORMAL HIGH (ref 70–99)
Potassium: 3.1 mmol/L — ABNORMAL LOW (ref 3.5–5.1)
Potassium: 3.2 mmol/L — ABNORMAL LOW (ref 3.5–5.1)
Potassium: 3.5 mmol/L (ref 3.5–5.1)
Sodium: 135 mmol/L (ref 135–145)
Sodium: 137 mmol/L (ref 135–145)
Sodium: 139 mmol/L (ref 135–145)

## 2022-06-04 LAB — GLUCOSE, CAPILLARY
Glucose-Capillary: 120 mg/dL — ABNORMAL HIGH (ref 70–99)
Glucose-Capillary: 152 mg/dL — ABNORMAL HIGH (ref 70–99)
Glucose-Capillary: 157 mg/dL — ABNORMAL HIGH (ref 70–99)
Glucose-Capillary: 130 mg/dL — ABNORMAL HIGH (ref 70–99)
Glucose-Capillary: 162 mg/dL — ABNORMAL HIGH (ref 70–99)
Glucose-Capillary: 173 mg/dL — ABNORMAL HIGH (ref 70–99)
Glucose-Capillary: 159 mg/dL — ABNORMAL HIGH (ref 70–99)
Glucose-Capillary: 118 mg/dL — ABNORMAL HIGH (ref 70–99)
Glucose-Capillary: 166 mg/dL — ABNORMAL HIGH (ref 70–99)
Glucose-Capillary: 174 mg/dL — ABNORMAL HIGH (ref 70–99)
Glucose-Capillary: 198 mg/dL — ABNORMAL HIGH (ref 70–99)
Glucose-Capillary: 205 mg/dL — ABNORMAL HIGH (ref 70–99)
Glucose-Capillary: 207 mg/dL — ABNORMAL HIGH (ref 70–99)
Glucose-Capillary: 222 mg/dL — ABNORMAL HIGH (ref 70–99)
Glucose-Capillary: 99 mg/dL (ref 70–99)
Glucose-Capillary: 119 mg/dL — ABNORMAL HIGH (ref 70–99)
Glucose-Capillary: 125 mg/dL — ABNORMAL HIGH (ref 70–99)

## 2022-06-04 LAB — CBC
HCT: 37.5 % (ref 36.0–46.0)
Hemoglobin: 12.3 g/dL (ref 12.0–15.0)
MCH: 28.9 pg (ref 26.0–34.0)
MCHC: 32.8 g/dL (ref 30.0–36.0)
MCV: 88.2 fL (ref 80.0–100.0)
Platelets: 172 10*3/uL (ref 150–400)
RBC: 4.25 MIL/uL (ref 3.87–5.11)
RDW: 12.7 % (ref 11.5–15.5)
WBC: 16.5 10*3/uL — ABNORMAL HIGH (ref 4.0–10.5)
nRBC: 0 % (ref 0.0–0.2)

## 2022-06-04 LAB — BETA-HYDROXYBUTYRIC ACID: Beta-Hydroxybutyric Acid: 1.16 mmol/L — ABNORMAL HIGH (ref 0.05–0.27)

## 2022-06-04 LAB — MAGNESIUM: Magnesium: 2.5 mg/dL — ABNORMAL HIGH (ref 1.7–2.4)

## 2022-06-04 MED ORDER — PANTOPRAZOLE SODIUM 40 MG PO TBEC
40.0000 mg | DELAYED_RELEASE_TABLET | Freq: Every day | ORAL | Status: DC
  Administered 2022-06-04 – 2022-06-06 (×3): 40 mg via ORAL
  Filled 2022-06-04 (×3): qty 1

## 2022-06-04 MED ORDER — INSULIN ASPART 100 UNIT/ML IJ SOLN: 0.0000 [IU] | Freq: Three times a day (TID) | INTRAMUSCULAR | Status: DC

## 2022-06-04 MED ORDER — LORAZEPAM 2 MG/ML IJ SOLN
1.0000 mg | Freq: Once | INTRAMUSCULAR | Status: AC
  Administered 2022-06-04: 1 mg via INTRAVENOUS
  Filled 2022-06-04: qty 1

## 2022-06-04 MED ORDER — INSULIN ASPART 100 UNIT/ML IJ SOLN: 0.0000 [IU] | Freq: Every day | INTRAMUSCULAR | Status: DC

## 2022-06-04 MED ORDER — OXYCODONE HCL 5 MG PO TABS
5.0000 mg | ORAL_TABLET | ORAL | Status: DC | PRN
  Administered 2022-06-04 – 2022-06-05 (×4): 10 mg via ORAL
  Filled 2022-06-04 (×4): qty 2
  Filled 2022-06-04: qty 1

## 2022-06-04 MED ORDER — ENSURE MAX PROTEIN PO LIQD: 11.0000 [oz_av] | Freq: Two times a day (BID) | ORAL | Status: DC

## 2022-06-04 MED ORDER — INSULIN GLARGINE-YFGN 100 UNIT/ML ~~LOC~~ SOLN
15.0000 [IU] | Freq: Every day | SUBCUTANEOUS | Status: DC
  Administered 2022-06-04 – 2022-06-05 (×2): 15 [IU] via SUBCUTANEOUS
  Filled 2022-06-04 (×3): qty 0.15

## 2022-06-04 MED ORDER — ADULT MULTIVITAMIN W/MINERALS CH
1.0000 | ORAL_TABLET | Freq: Every day | ORAL | Status: DC
  Administered 2022-06-05 – 2022-06-06 (×2): 1 via ORAL
  Filled 2022-06-04 (×2): qty 1

## 2022-06-04 MED ORDER — METOCLOPRAMIDE HCL 5 MG PO TABS
10.0000 mg | ORAL_TABLET | Freq: Three times a day (TID) | ORAL | Status: DC
  Administered 2022-06-04 – 2022-06-06 (×5): 10 mg via ORAL
  Filled 2022-06-04: qty 1
  Filled 2022-06-04 (×4): qty 2
  Filled 2022-06-04: qty 1

## 2022-06-04 MED ORDER — INSULIN ASPART 100 UNIT/ML IJ SOLN
0.0000 [IU] | Freq: Three times a day (TID) | INTRAMUSCULAR | Status: DC
  Administered 2022-06-04: 3 [IU] via SUBCUTANEOUS
  Administered 2022-06-05: 7 [IU] via SUBCUTANEOUS
  Filled 2022-06-04 (×3): qty 1

## 2022-06-04 MED ORDER — INSULIN ASPART 100 UNIT/ML IJ SOLN
2.0000 [IU] | Freq: Three times a day (TID) | INTRAMUSCULAR | Status: DC
  Administered 2022-06-04 – 2022-06-05 (×2): 2 [IU] via SUBCUTANEOUS
  Filled 2022-06-04 (×3): qty 1

## 2022-06-04 MED ORDER — METOPROLOL TARTRATE 25 MG PO TABS
12.5000 mg | ORAL_TABLET | Freq: Two times a day (BID) | ORAL | Status: DC
  Administered 2022-06-04 – 2022-06-06 (×4): 12.5 mg via ORAL
  Filled 2022-06-04 (×5): qty 1

## 2022-06-04 MED ORDER — LEVETIRACETAM 750 MG PO TABS
750.0000 mg | ORAL_TABLET | Freq: Two times a day (BID) | ORAL | Status: DC
  Administered 2022-06-04 – 2022-06-06 (×4): 750 mg via ORAL
  Filled 2022-06-04 (×5): qty 1

## 2022-06-04 NOTE — Inpatient Diabetes Management (Signed)
Inpatient Diabetes Program Recommendations  AACE/ADA: New Consensus Statement on Inpatient Glycemic Control (2015)  Target Ranges:  Prepandial:   less than 140 mg/dL      Peak postprandial:   less than 180 mg/dL (1-2 hours)      Critically ill patients:  140 - 180 mg/dL   Lab Results  Component Value Date   GLUCAP 198 (H) 06/04/2022   HGBA1C 13.9 (H) 05/06/2020    Latest Reference Range & Units 06/04/22 05:33  Sodium 135 - 145 mmol/L 137  Potassium 3.5 - 5.1 mmol/L 3.2 (L)  Chloride 98 - 111 mmol/L 98  CO2 22 - 32 mmol/L 29  Glucose 70 - 99 mg/dL 156 (H)  BUN 6 - 20 mg/dL 12  Creatinine 0.44 - 1.00 mg/dL 0.61  Calcium 8.9 - 10.3 mg/dL 8.9  Anion gap 5 - 15  10  Magnesium 1.7 - 2.4 mg/dL 2.5 (H)  GFR, Estimated >60 mL/min >60  (L): Data is abnormally low (H): Data is abnormally high  Diabetes history: DM2 Outpatient Diabetes medications: 70/30 10 BID  Current orders for Inpatient glycemic control: Semglee 15 units qd, Novolog 0-15 units tid, 0-5 units hs  Inpatient Diabetes Program Recommendations:   Please consider: -Decrease Novolog correction to 0-9 units tid, 0-5 units hs -Add Novolog 2 units tid meal coverage if eats 50% meals if postprandial CBG >180.  Pending A1c  Thank you, Nani Gasser. Shayleen Eppinger, RN, MSN, CDE  Diabetes Coordinator Inpatient Glycemic Control Team Team Pager [phone] (8am-5pm) 06/04/2022 10:50 AM

## 2022-06-04 NOTE — Progress Notes (Signed)
Initial Nutrition Assessment  DOCUMENTATION CODES:   Not applicable  INTERVENTION:   Ensure Max protein supplement BID, each supplement provides 150kcal and 30g of protein.  MVI po daily   Pt at high refeed risk; recommend monitor potassium, magnesium and phosphorus labs daily until stable  Daily weights   NUTRITION DIAGNOSIS:   Inadequate oral intake related to acute illness as evidenced by per patient/family report.  GOAL:   Patient will meet greater than or equal to 90% of their needs  MONITOR:   PO intake, Supplement acceptance, Labs, Weight trends, I & O's, Skin  REASON FOR ASSESSMENT:   Malnutrition Screening Tool    ASSESSMENT:   42 y/o female with h/o pseudoseizures, cannabinoid hyperemesis syndrome, DM, GERD, anxiety, duodenitis, SLE and gastroparesis who is admitted with DKA and seizure like activity.  Met with pt in room today. Pt lethargic and does not provide good history. Pt reports that she does not feel well today. Pt reports nausea today which appears to be chronic. Pt with h/o gastroparesis; pt takes reglan at home. Pt initiated on a carbohydrate controlled diet today. RD will add supplements and MVI to help pt meet her estimated needs. Pt is at high refeed risk. Will hold on any diet education today as pt does not feel well. Per chart, pt appears weight stable at baseline.   Medications reviewed and include: lovenox, insulin, reglan, MVI, nicotine, protonix, carafate, LRS w/ 5% dextrose '@125ml'$ /hr  Labs reviewed: K 3.2(L), Mg 2.5(H) P 3.0 wnl- 3/11 Wbc- 16.5(H) Cbgs- 222, 207, 205, 198, 174, 166, 159, 173 x 24 hrs  AIC 13.9(H)- 2/22  NUTRITION - FOCUSED PHYSICAL EXAM:  Flowsheet Row Most Recent Value  Orbital Region No depletion  Upper Arm Region Mild depletion  Thoracic and Lumbar Region No depletion  Buccal Region No depletion  Temple Region No depletion  Clavicle Bone Region Mild depletion  Clavicle and Acromion Bone Region Mild depletion   Scapular Bone Region No depletion  Dorsal Hand No depletion  Patellar Region Moderate depletion  Anterior Thigh Region Moderate depletion  Posterior Calf Region Moderate depletion  Edema (RD Assessment) None  Hair Reviewed  Eyes Reviewed  Mouth Reviewed  Skin Reviewed  Nails Reviewed   Diet Order:   Diet Order             Diet Carb Modified Fluid consistency: Thin; Room service appropriate? Yes  Diet effective now                  EDUCATION NEEDS:   Not appropriate for education at this time  Skin:  Skin Assessment: Reviewed RN Assessment  Last BM:  pta  Height:   Ht Readings from Last 1 Encounters:  06/03/22 '5\' 5"'$  (1.651 m)    Weight:   Wt Readings from Last 1 Encounters:  06/03/22 58.5 kg    Ideal Body Weight:  56.8 kg  BMI:  Body mass index is 21.46 kg/m.  Estimated Nutritional Needs:   Kcal:  1700-1900kcal/day  Protein:  85-95g/day  Fluid:  1.7-1.9L/day  Koleen Distance MS, RD, LDN Please refer to [facility] for RD and/or RD on-call/weekend/after hours pager

## 2022-06-04 NOTE — Progress Notes (Signed)
PROGRESS NOTE    Rachael Gray  [id number] DOB: 1980-08-17 DOA: 06/03/2022 [facility]    Brief Narrative:  42 y.o. female with medical history significant of diabetes mellitus, gastroparesis, cannabinoidal hyperemesis syndrome, GERD, anxiety, seizure/pseudoseizure, lupus, tobacco abuse, duodenitis, esophagitis, GERD,  anemia, DKA, substance abuse, who presents with seizure and  shaking.   Pt states that she had 3- 4 seizures at home, described as "tonic-clonic seizure" by patient.  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  She is supposed to take Keppra 750 mg twice daily, but she has not taken this medication for more than half year.  Pt is constantly shaking back-and-forth in bed in the ED.  No seizure activity during the interview.  States that when she has pain, she keeps shaking her body. Patient states that she has nausea, vomiting and abdominal pain.  She has multiple episodes of bilious vomiting.  No diarrhea.  Her abdominal pain is diffuse, constant, severe, nonradiating.  Denies symptoms of UTI.  No fever or chills.  No chest pain, cough, shortness breath.  3/12: Patient admitted to stepdown unit and was initiated on insulin gtt.  Mild DKA resolved.  Anion gap closed.  Will be transition to subcutaneous regimen.  Diabetes coordinator consulted.  Patient has persistent shaking activity.  Not consistent with true seizure.  Patient is mentating during these episodes.  Endorsing severe abdominal pain.  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  Anion gap is closed.  BHB trending down.  Bicarbonate improving.  Suspect secondary to medication nonadherence. Plan: Transition to subcutaneous insulin regimen Carb modified diet CBG before meals and at bedtime LR at 100 cc/h until tolerating p.o. 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  Patient is supposed to take NPH insulin 10 units twice daily, but she is not taking medications for long time -On basal bolus regimen while admitted.  Diabetes coordinator following   Diabetic gastroparesis Intractable abdominal pain Intractable nausea vomiting Nausea vomiting and abdominal pain are most likely due to multifactorial etiology, including history of duodenitis, gastroparesis, cannabinoid hyperemesis syndrome.  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  She has consistent shaking activity that is not consistent with true seizure.  She is mentating clearly, sitting up, maintaining airway during the episodes.  Received 1 g Keppra load in ED.  CT head negative Plan: Continue Keppra 750 mg p.o. twice daily Ativan as needed breakthrough seizure activity Seizure precautions Can consider consultation with neurology or psychiatry during hospitalization if symptoms do not improve  Duodenitis -Protonix 40 mg twice daily -Carafate  Leukocytosis: WBC 18.2, no fever, no source of infection identified.  Likely reactive -Follow-up with  CBC   DVT prophylaxis: SQ Lovenox Code Status: Full Family Communication: None today Disposition Plan: Status is: Inpatient Remains inpatient appropriate because: Resolving DKA, seizure/pseudoseizure, intractable abdominal pain secondary to diabetic gastroparesis   Level of care: Med-Surg  Consultants:  None  Procedures:  None  Antimicrobials: None   Subjective: Seen and examined.  Mentating clearly.  Appears uncomfortable.  Endorsing abdominal pain.  Shaking activity noted.  Objective: Vitals:   06/04/22 1200 06/04/22 1205 06/04/22 1252 06/04/22 1300  BP:  (!) 152/82  104/60  Pulse: 97 88 87 88  Resp: 19 (!) 8 (!) 0 (!) 0  Temp:   98.5 F (36.9 C)   TempSrc:   Oral   SpO2: 99% 100% 100% 100%  Weight:      Height:        Intake/Output Summary (Last 24 hours) at 06/04/2022 1357 Last data filed at 06/04/2022 1300 Gross per 24 hour  Intake 4556.54 ml  Output 1775 ml  Net 2781.54 ml   Filed Weights   06/03/22 1037 06/03/22 1600  Weight: 61 kg 58.5 kg    Examination:  General exam: Constant shaking activity Respiratory system: Lungs clear.  Normal work of breathing.  Room air Cardiovascular system: Tachycardic, regular rhythm, no murmurs Gastrointestinal system: Soft, nondistended, tender to palpation, hypoactive bowel sounds Central nervous system: Alert and oriented.  Consistent shaking activity Extremities: Symmetric 5 x 5 power. Skin: No rashes, lesions or ulcers Psychiatry: Judgement and insight appear impaired. Mood & affect appropriate.     Data Reviewed: I have personally reviewed following labs and imaging studies  CBC: Recent Labs  Lab 06/02/22 2015 06/03/22 1058 06/04/22 0533  WBC 13.5* 18.2* 16.5*  NEUTROABS  --  16.0*  --   HGB 13.5 14.2 12.3  HCT 40.9 42.8 37.5  MCV 87.0 86.8 88.2  PLT 238 256 [id number]   Basic Metabolic Panel: Recent Labs  Lab 06/03/22 1637 06/03/22 2132 06/04/22 0029 06/04/22 0515 06/04/22 0533  NA 135 136 139  135 137  K 3.4* 3.0* 3.1* 3.5 3.2*  CL 98 98 101 99 98  CO2 '22 26 31 22 29  '$ GLUCOSE 346* 188* 119* 154* 156*  BUN '17 15 15 11 12  '$ CREATININE 0.76 0.64 0.63 0.61 0.61  CALCIUM 8.8* 8.8* 8.8* 8.3* 8.9  MG 2.0  --   --   --  2.5*  PHOS 3.0  --   --   --   --    GFR: Estimated Creatinine Clearance: 84.1 mL/min (by C-G formula based on SCr of 0.61 mg/dL). Liver Function Tests: Recent Labs  Lab 06/03/22 1058  AST 27  ALT 11  ALKPHOS 57  BILITOT 0.8  PROT 9.1*  ALBUMIN 4.8   Recent Labs  Lab 06/03/22 1058  LIPASE 31   No results for input(s): "AMMONIA" in the last 168 hours. Coagulation Profile: No results for input(s): "INR", "PROTIME" in the last 168 hours. Cardiac Enzymes: No results for input(s): "CKTOTAL", "CKMB", "CKMBINDEX", "TROPONINI" in the last 168 hours. BNP (last 3 results) No results for input(s): "PROBNP" in the last 8760 hours. HbA1C: No results for input(s): "HGBA1C" in the last 72 hours. CBG: Recent Labs  Lab 06/04/22 0859 06/04/22 1001 06/04/22 1102 06/04/22 1130 06/04/22 1228  GLUCAP 174* 198* 205* 207* 222*   Lipid Profile: No results for input(s): "CHOL", "HDL", "LDLCALC", "TRIG", "CHOLHDL", "LDLDIRECT" in the last 72 hours.  Thyroid Function Tests: No results for input(s): "TSH", "T4TOTAL", "FREET4", "T3FREE", "THYROIDAB" in the last 72 hours. Anemia Panel: No results for input(s): "VITAMINB12", "FOLATE", "FERRITIN", "TIBC", "IRON", "RETICCTPCT" in the last 72 hours. Sepsis Labs: No results for input(s): "PROCALCITON", "LATICACIDVEN" in the last 168 hours.  Recent Results (from the past 240 hour(s))  Resp panel by RT-PCR (RSV, Flu A&B, Covid) Anterior Nasal Swab     Status: None   Collection Time: 06/03/22 10:26 AM   Specimen: Anterior Nasal Swab  Result Value Ref Range Status   SARS Coronavirus 2 by RT PCR NEGATIVE NEGATIVE Final    Comment: (NOTE) SARS-CoV-2 target nucleic acids are NOT DETECTED.  The SARS-CoV-2 RNA is generally  detectable in upper respiratory specimens during the acute phase of infection. The lowest concentration of SARS-CoV-2 viral copies this assay can detect is 138 copies/mL. A negative result does not preclude SARS-Cov-2 infection and should not be used as the sole basis for treatment or other patient management decisions. A negative result may occur with  improper specimen collection/handling, submission of specimen other than nasopharyngeal swab, presence of viral mutation(s) within the areas targeted by this assay, and inadequate number of viral copies(<138 copies/mL). A negative result must be combined with clinical observations, patient history, and epidemiological information. The expected result is Negative.  Fact Sheet for Patients:  EntrepreneurPulse.com.au  Fact Sheet for Healthcare Providers:  IncredibleEmployment.be  This test is no t yet approved or cleared by the [place] FDA and  has been authorized for detection and/or diagnosis of SARS-CoV-2 by FDA under an Emergency Use Authorization (EUA). This EUA will remain  in effect (meaning this test can be used) for the duration of the COVID-19 declaration under Section 564(b)(1) of the Act, 21 U.S.C.section 360bbb-3(b)(1), unless the authorization is terminated  or revoked sooner.       Influenza A by PCR NEGATIVE NEGATIVE Final   Influenza B by PCR NEGATIVE NEGATIVE Final    Comment: (NOTE) The Xpert Xpress SARS-CoV-2/FLU/RSV plus assay is intended as an aid in the diagnosis of influenza from Nasopharyngeal swab specimens and should not be used as a sole basis for treatment. Nasal washings and aspirates are unacceptable for Xpert Xpress SARS-CoV-2/FLU/RSV testing.  Fact Sheet for Patients: EntrepreneurPulse.com.au  Fact Sheet for Healthcare Providers: IncredibleEmployment.be  This test is not yet approved or cleared by the [place] FDA  and has been authorized for detection and/or diagnosis of SARS-CoV-2 by FDA under an Emergency Use Authorization (EUA). This EUA will remain in effect (meaning this test can be used) for the duration of the COVID-19 declaration under Section 564(b)(1) of the Act, 21 U.S.C. section 360bbb-3(b)(1), unless the authorization is terminated or revoked.     Resp Syncytial Virus by PCR NEGATIVE NEGATIVE Final    Comment: (NOTE) Fact Sheet for Patients: EntrepreneurPulse.com.au  Fact Sheet for Healthcare Providers: IncredibleEmployment.be  This test is not yet approved or cleared by the [place] FDA and has been authorized for detection and/or diagnosis of SARS-CoV-2 by FDA under an Emergency Use Authorization (EUA). This EUA will remain in effect (meaning this test can be used) for the duration of the COVID-19 declaration under Section 564(b)(1) of the Act, 21 U.S.C. section 360bbb-3(b)(1), unless the authorization is terminated or revoked.  Performed at [facility], [address] [place], [place] [address]   MRSA Next Gen by PCR, Nasal     Status: None   Collection Time: 06/03/22  4:30 PM   Specimen: Nasal Mucosa;  Nasal Swab  Result Value Ref Range Status   MRSA by PCR Next Gen NOT DETECTED NOT DETECTED Final    Comment: (NOTE) The GeneXpert MRSA Assay (FDA approved for NASAL specimens only), is one component of a comprehensive MRSA colonization surveillance program. It is not intended to diagnose MRSA infection nor to guide or monitor treatment for MRSA infections. Test performance is not FDA approved in patients less than 65 years old. Performed at [facility], [address] [place], [place] [address]          Radiology Studies: CT ABDOMEN PELVIS WO CONTRAST  Result Date: 06/04/2022 CLINICAL DATA:  Abdominal pain, acute, nonlocalized. Nausea, vomiting and abdominal pain. Bilious vomiting. EXAM: CT ABDOMEN  AND PELVIS WITHOUT CONTRAST TECHNIQUE: Multidetector CT imaging of the abdomen and pelvis was performed following the standard protocol without IV contrast. RADIATION DOSE REDUCTION: This exam was performed according to the departmental dose-optimization program which includes automated exposure control, adjustment of the mA and/or kV according to patient size and/or use of iterative reconstruction technique. COMPARISON:  CT abdomen/pelvis 10/07/2021. FINDINGS: Lower chest: No acute abnormality. Hepatobiliary: No focal liver abnormality is seen. Status post cholecystectomy. No biliary dilatation. Pancreas: Unremarkable. No pancreatic ductal dilatation or surrounding inflammatory changes. Spleen: Normal in size without focal abnormality. Adrenals/Urinary Tract: Unremarkable adrenal glands. Punctate calyceal calculus in the interpolar left kidney. No hydronephrosis. Bladder is unremarkable. Stomach/Bowel: Normal stomach and duodenum. No dilated loops of small bowel. Appendix is not visualized. No bowel wall thickening or surrounding inflammation. Vascular/Lymphatic: Atherosclerotic calcifications of the abdominal aorta and its branches. No abdominal or pelvic lymphadenopathy. Reproductive: Uterus and bilateral adnexa are unremarkable. Other: No abdominal wall hernia or abnormality. No abdominopelvic ascites. Musculoskeletal: No acute or significant osseous findings. IMPRESSION: 1. No acute abdominopelvic abnormality. 2. Punctate calyceal calculus in the interpolar left kidney. No hydronephrosis. 3. Aortic Atherosclerosis (ICD10-I70.0). Electronically Signed   By: Emmit Alexanders M.D.   On: 06/04/2022 09:51   CT HEAD WO CONTRAST (5MM)  Result Date: 06/04/2022 CLINICAL DATA:  Seizure disorder, clinical change EXAM: CT HEAD WITHOUT CONTRAST TECHNIQUE: Contiguous axial images were obtained from the base of the skull through the vertex without intravenous contrast. RADIATION DOSE REDUCTION: This exam was performed  according to the departmental dose-optimization program which includes automated exposure control, adjustment of the mA and/or kV according to patient size and/or use of iterative reconstruction technique. COMPARISON:  CT head 10/07/2021 FINDINGS: Brain: No evidence of large-territorial acute infarction. No parenchymal hemorrhage. No mass lesion. No extra-axial collection. No mass effect or midline shift. No hydrocephalus. Basilar cisterns are patent. Vascular: No hyperdense vessel. Skull: No acute fracture or focal lesion. Sinuses/Orbits: Paranasal sinuses and mastoid air cells are clear. The orbits are unremarkable. Other: None. IMPRESSION: No acute intracranial abnormality. Electronically Signed   By: Iven Finn M.D.   On: 06/04/2022 00:32        Scheduled Meds:  Chlorhexidine Gluconate Cloth  6 each Topical QHS   enoxaparin (LOVENOX) injection  40 mg Subcutaneous Q24H   insulin aspart  0-5 Units Subcutaneous QHS   insulin aspart  0-9 Units Subcutaneous TID WC   insulin aspart  2 Units Subcutaneous TID WC   insulin glargine-yfgn  15 Units Subcutaneous Daily   levETIRAcetam  750 mg Oral BID   metoCLOPramide  10 mg Oral TID AC   metoprolol tartrate  12.5 mg Oral BID   [START ON 06/05/2022] multivitamin with minerals  1 tablet Oral Daily   nicotine  21 mg Transdermal Daily   pantoprazole  40 mg Oral Daily   Ensure Max Protein  11 oz Oral BID   sucralfate  1 g Oral QID   Continuous Infusions:  dextrose 5% lactated ringers Stopped (06/04/22 1236)   insulin Stopped (06/04/22 1236)   lactated ringers 100 mL/hr at 06/04/22 1245     LOS: 0 days      Sidney Ace, MD [facility]   If 7PM-7AM, please contact night-coverage  06/04/2022, 1:57 PM

## 2022-06-05 DIAGNOSIS — E1010 Type 1 diabetes mellitus with ketoacidosis without coma: Secondary | ICD-10-CM | POA: Diagnosis not present

## 2022-06-05 LAB — MAGNESIUM
Magnesium: 1.7 mg/dL (ref 1.7–2.4)
Magnesium: 1.8 mg/dL (ref 1.7–2.4)

## 2022-06-05 LAB — CBC WITH DIFFERENTIAL/PLATELET
Abs Immature Granulocytes: 0.05 10*3/uL (ref 0.00–0.07)
Basophils Absolute: 0.1 10*3/uL (ref 0.0–0.1)
Basophils Relative: 1 %
Eosinophils Absolute: 0 10*3/uL (ref 0.0–0.5)
Eosinophils Relative: 0 %
HCT: 34.6 % — ABNORMAL LOW (ref 36.0–46.0)
Hemoglobin: 11.4 g/dL — ABNORMAL LOW (ref 12.0–15.0)
Immature Granulocytes: 1 %
Lymphocytes Relative: 40 %
Lymphs Abs: 3.7 10*3/uL (ref 0.7–4.0)
MCH: 28.9 pg (ref 26.0–34.0)
MCHC: 32.9 g/dL (ref 30.0–36.0)
MCV: 87.8 fL (ref 80.0–100.0)
Monocytes Absolute: 0.6 10*3/uL (ref 0.1–1.0)
Monocytes Relative: 6 %
Neutro Abs: 4.9 10*3/uL (ref 1.7–7.7)
Neutrophils Relative %: 52 %
Platelets: 156 10*3/uL (ref 150–400)
RBC: 3.94 MIL/uL (ref 3.87–5.11)
RDW: 12.6 % (ref 11.5–15.5)
Smear Review: NORMAL
WBC: 9.3 10*3/uL (ref 4.0–10.5)
nRBC: 0 % (ref 0.0–0.2)

## 2022-06-05 LAB — BASIC METABOLIC PANEL
Anion gap: 6 (ref 5–15)
Anion gap: 9 (ref 5–15)
BUN: 7 mg/dL (ref 6–20)
BUN: 5 mg/dL — ABNORMAL LOW (ref 6–20)
CO2: 30 mmol/L (ref 22–32)
CO2: 29 mmol/L (ref 22–32)
Calcium: 8.7 mg/dL — ABNORMAL LOW (ref 8.9–10.3)
Calcium: 8.6 mg/dL — ABNORMAL LOW (ref 8.9–10.3)
Chloride: 99 mmol/L (ref 98–111)
Chloride: 99 mmol/L (ref 98–111)
Creatinine, Ser: 0.58 mg/dL (ref 0.44–1.00)
Creatinine, Ser: 0.65 mg/dL (ref 0.44–1.00)
GFR, Estimated: >60 mL/min (ref >60)
GFR, Estimated: >60 mL/min (ref >60)
Glucose, Bld: 267 mg/dL — ABNORMAL HIGH (ref 70–99)
Glucose, Bld: 180 mg/dL — ABNORMAL HIGH (ref 70–99)
Potassium: 2.7 mmol/L — CL (ref 3.5–5.1)
Potassium: 3.4 mmol/L — ABNORMAL LOW (ref 3.5–5.1)
Sodium: 135 mmol/L (ref 135–145)
Sodium: 137 mmol/L (ref 135–145)

## 2022-06-05 LAB — PHOSPHORUS
Phosphorus: 2 mg/dL — ABNORMAL LOW (ref 2.5–4.6)
Phosphorus: 2.9 mg/dL (ref 2.5–4.6)

## 2022-06-05 LAB — GLUCOSE, CAPILLARY
Glucose-Capillary: 301 mg/dL — ABNORMAL HIGH (ref 70–99)
Glucose-Capillary: 280 mg/dL — ABNORMAL HIGH (ref 70–99)
Glucose-Capillary: 260 mg/dL — ABNORMAL HIGH (ref 70–99)
Glucose-Capillary: 64 mg/dL — ABNORMAL LOW (ref 70–99)
Glucose-Capillary: 69 mg/dL — ABNORMAL LOW (ref 70–99)
Glucose-Capillary: 87 mg/dL (ref 70–99)

## 2022-06-05 LAB — HEMOGLOBIN A1C
Hgb A1c MFr Bld: 10.3 % — ABNORMAL HIGH (ref 4.8–5.6)
Mean Plasma Glucose: 249 mg/dL

## 2022-06-05 MED ORDER — INSULIN ASPART 100 UNIT/ML IJ SOLN
4.0000 [IU] | Freq: Three times a day (TID) | INTRAMUSCULAR | Status: DC
  Administered 2022-06-05: 4 [IU] via SUBCUTANEOUS
  Filled 2022-06-05: qty 1

## 2022-06-05 MED ORDER — MAGNESIUM OXIDE -MG SUPPLEMENT 400 (240 MG) MG PO TABS
800.0000 mg | ORAL_TABLET | Freq: Once | ORAL | Status: AC
  Administered 2022-06-05: 800 mg via ORAL
  Filled 2022-06-05: qty 2

## 2022-06-05 MED ORDER — POTASSIUM PHOSPHATES 15 MMOLE/5ML IV SOLN
30.0000 mmol | Freq: Once | INTRAVENOUS | Status: AC
  Administered 2022-06-05: 30 mmol via INTRAVENOUS
  Filled 2022-06-05: qty 10

## 2022-06-05 MED ORDER — POTASSIUM CHLORIDE CRYS ER 20 MEQ PO TBCR
40.0000 meq | EXTENDED_RELEASE_TABLET | Freq: Two times a day (BID) | ORAL | Status: AC
  Administered 2022-06-05 (×2): 40 meq via ORAL
  Filled 2022-06-05 (×2): qty 2

## 2022-06-05 NOTE — TOC Progression Note (Signed)
Transition of Care (TOC) - Progression Note    Patient Details  Name: Rachael Gray MRN: [id number] Date of Birth: 1980/05/23  Transition of Care [facility]) CM/SW [place], RN Phone Number: 06/05/2022, 1:06 PM  Clinical Narrative:    Met with the patient in the room She reports that her PCP told her not to take the Insulin because they were not sure if she was a type 1 or 2 diabetic She reports that she can afford her medication I let her know that the provider here will prescribe needed meds and that the information would e sent to her PCP and she would need to follow up with the PCP in a week or so after DC She stated that her grandmother provides transportation to the doctor She reports no needs at this time I let her know that at any time of she can not afford her copay and has medicaid that the pharmacy will waive the copay if she tells them she can not afford it   Expected Discharge Plan: Home/Self Care Barriers to Discharge: No Barriers Identified  Expected Discharge Plan and Services   Discharge Planning Services: CM Consult                     DME Arranged: N/A DME Agency: NA       HH Arranged: NA           Social Determinants of Health (SDOH) Interventions SDOH Screenings   Food Insecurity: No Food Insecurity (08/01/2018)  Transportation Needs: No Transportation Needs (08/01/2018)  Financial Resource Strain: Low Risk  (08/01/2018)  Physical Activity: Inactive (08/01/2018)  Social Connections: Moderately Isolated (08/01/2018)  Stress: Stress Concern Present (08/01/2018)  Tobacco Use: High Risk (06/02/2022)    Readmission Risk Interventions    06/28/2020    2:24 PM  Readmission Risk Prevention Plan  Transportation Screening Complete  PCP or Specialist Appt within 3-5 Days Complete  HRI or [place] Complete  Social Work Consult for [place] Planning/Counseling Complete  Palliative Care Screening Not Applicable  Medication Review  Press photographer) Complete

## 2022-06-05 NOTE — Progress Notes (Signed)
Progress Note   Patient: Rachael Gray [id number] DOB: 1980-11-16 DOA: 06/03/2022     1 DOS: the patient was seen and examined on 06/05/2022   Brief hospital course:  42 y.o. female with medical history significant of diabetes mellitus, gastroparesis, cannabinoidal hyperemesis syndrome, GERD, anxiety, seizure/pseudoseizure, lupus, tobacco abuse, duodenitis, esophagitis, GERD,  anemia, DKA, substance abuse, who presents with seizure and  shaking.   Pt states that she had 3- 4 seizures at home, described as "tonic-clonic seizure" by patient.  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  She is supposed to take Keppra 750 mg twice daily, but she has not taken this medication for more than half year.  Pt is constantly shaking back-and-forth in bed in the ED.  No seizure activity during the interview.  States that when she has pain, she keeps shaking her body. Patient states that she has nausea, vomiting and abdominal pain.  She has multiple episodes of bilious vomiting.  No diarrhea.  Her abdominal pain is diffuse, constant, severe, nonradiating.  Denies symptoms of UTI.  No fever or chills.  No chest pain, cough, shortness breath.   3/12: Patient admitted to stepdown unit and was initiated on insulin gtt.  Mild DKA resolved.  Anion gap closed.  Will be transition to subcutaneous regimen.  Diabetes coordinator consulted.  Patient has persistent shaking activity.  Not consistent with true seizure.  Patient is mentating during these episodes.  Endorsing severe abdominal pain.  3/13: Patient is seen and examined at the bedside.  Feels fine and denies having any nausea, no vomiting or diarrhea.  Noted to have severe hypokalemia.      Assessment and Plan: DKA (diabetic ketoacidosis) ([place]) Patient presented with signs of mild DKA with elevated blood sugar and elevated anion gap and beta hydroxybutyric acid.   Anion gap has closed.  BHB trending down.  Bicarbonate improving.  Suspect  secondary to medication nonadherence. 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   Patient is supposed to take NPH insulin 10 units twice daily, but she has not taken any insulin for a long time -On basal bolus regimen while admitted.  Diabetes coordinator following   Diabetic gastroparesis Intractable abdominal pain Intractable nausea vomiting Resolved Nausea, vomiting and abdominal pain are most likely due to multifactorial etiology, including history of duodenitis, gastroparesis, cannabinoid hyperemesis syndrome.   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  She has consistent shaking activity that is not consistent with true seizure.  She is mentating clearly, sitting up, maintaining airway during the episodes.  Received 1 g Keppra load in ED.  CT head negative Continue Keppra 750 mg p.o. twice daily Ativan as needed breakthrough seizure activity Seizure precautions Can consider consultation with neurology or psychiatry during hospitalization if symptoms do not improve    Duodenitis -Protonix 40 mg twice daily -Carafate   Leukocytosis:  WBC 18.2 on admission, no fever, no source of infection identified.  Likely reactive Normalized  Subjective: Patient is seen and examined at the bedside.  Feels well and has no new complaints  Physical Exam: Vitals:   06/04/22 1830 06/04/22 2143 06/04/22 2332 06/05/22  0742  BP: 127/75 (!) 106/55 108/66 (!) 146/85  Pulse: 79 78 76 93  Resp: '15  16 16  '$ Temp: 98.3 F (36.8 C)  98 F (36.7 C) 98 F (36.7 C)  TempSrc: Oral  Oral   SpO2: 100%  98% 100%  Weight:      Height:       Physical Exam Vitals and nursing note reviewed.  Constitutional:      Appearance: Normal appearance.  HENT:     Head: Normocephalic and atraumatic.     Nose: Nose normal.     Mouth/Throat:     Mouth: Mucous membranes are moist.  Eyes:     Conjunctiva/sclera: Conjunctivae normal.  Cardiovascular:     Rate and Rhythm: Normal rate and regular rhythm.  Pulmonary:     Effort: Pulmonary effort is normal.     Breath sounds: Normal breath sounds.  Abdominal:     General: Abdomen is flat. Bowel sounds are normal.     Palpations: Abdomen is soft.  Musculoskeletal:        General: Normal range of motion.     Cervical back: Normal range of motion and neck supple.  Skin:    General: Skin is warm and dry.  Neurological:     General: No focal deficit present.     Mental Status: She is alert.  Psychiatric:        Mood and Affect: Mood normal.        Behavior: Behavior normal.     Data Reviewed: Labs reviewed.  Noted to have significant hypokalemia, hypophosphatemia and A1c of 10.3 There are no new results to review at this time. Family Communication:   Disposition: Status is: Inpatient Remains inpatient appropriate because: Needs electrolyte replacement with plans for discharge in a.m.  Planned Discharge Destination: Home    Time spent: 35 minutes  Author: Collier Bullock, MD 06/05/2022 12:30 PM  For on call review [email or web address].

## 2022-06-05 NOTE — Plan of Care (Signed)
  Problem: Coping: Goal: Ability to adjust to condition or change in health will improve Outcome: Progressing   Problem: Health Behavior/Discharge Planning: Goal: Ability to identify and utilize available resources and services will improve Outcome: Progressing Goal: Ability to manage health-related needs will improve Outcome: Progressing   Problem: Education: Goal: Ability to describe self-care measures that may prevent or decrease complications (Diabetes Survival Skills Education) will improve Outcome: Progressing Goal: Individualized Educational Video(s) Outcome: Progressing   Problem: Metabolic: Goal: Ability to maintain appropriate glucose levels will improve Outcome: Progressing   Problem: Nutritional: Goal: Maintenance of adequate nutrition will improve Outcome: Progressing Goal: Maintenance of adequate weight for body size and type will improve Outcome: Progressing

## 2022-06-05 NOTE — Inpatient Diabetes Management (Signed)
Inpatient Diabetes Program Recommendations  AACE/ADA: New Consensus Statement on Inpatient Glycemic Control (2015)  Target Ranges:  Prepandial:   less than 140 mg/dL      Peak postprandial:   less than 180 mg/dL (1-2 hours)      Critically ill patients:  140 - 180 mg/dL   Lab Results  Component Value Date   GLUCAP 301 (H) 06/05/2022   HGBA1C 10.3 (H) 06/03/2022    Review of Glycemic Control  Diabetes history: DM2 Outpatient Diabetes medications: 70/30 10 units BID Current orders for Inpatient glycemic control: Lantus 15 units daily, Novolog 0-9 units TID with meals, Novolog 0-5 units QHS, Novolog 2 units TID with meals  Inpatient Diabetes Program Recommendations:   Please consider: -Decrease Novolog correction to 0-6 units tid, 0-5 units hs   Spoke with pt about A1C 10.3 (average blood glucose 249 over the past 2-3 months) and explained what an A1C is, basic pathophysiology of DM Type 2, basic home care, basic diabetes diet nutrition principles, importance of checking CBGs and maintaining good CBG control to prevent long-term and short-term complications. Reviewed signs and symptoms of hyperglycemia and hypoglycemia and how to treat hypoglycemia at home. Also reviewed blood sugar goals at home.  Patient states she has been drinking koolaid and coffee with regular sugar but willing to change to Splenda instead to assist with glucose control.  Thank you, Nani Gasser. Ronney Honeywell, RN, MSN, CDE  Diabetes Coordinator Inpatient Glycemic Control Team Team Pager [phone] (8am-5pm) 06/05/2022 9:23 AM

## 2022-06-05 NOTE — Plan of Care (Signed)
  Problem: Pain Managment: Goal: General experience of comfort will improve Outcome: Progressing   Problem: Nutritional: Goal: Maintenance of adequate nutrition will improve Outcome: Progressing Goal: Progress toward achieving an optimal weight will improve Outcome: Progressing   Problem: Nutritional: Goal: Maintenance of adequate nutrition will improve Outcome: Progressing Goal: Maintenance of adequate weight for body size and type will improve Outcome: Progressing

## 2022-06-05 NOTE — Plan of Care (Signed)

## 2022-06-06 DIAGNOSIS — E1010 Type 1 diabetes mellitus with ketoacidosis without coma: Secondary | ICD-10-CM | POA: Diagnosis not present

## 2022-06-06 LAB — MAGNESIUM: Magnesium: 1.6 mg/dL — ABNORMAL LOW (ref 1.7–2.4)

## 2022-06-06 LAB — BASIC METABOLIC PANEL
Anion gap: 5 (ref 5–15)
BUN: 7 mg/dL (ref 6–20)
CO2: 28 mmol/L (ref 22–32)
Calcium: 8.7 mg/dL — ABNORMAL LOW (ref 8.9–10.3)
Chloride: 102 mmol/L (ref 98–111)
Creatinine, Ser: 0.52 mg/dL (ref 0.44–1.00)
GFR, Estimated: >60 mL/min (ref >60)
Glucose, Bld: 84 mg/dL (ref 70–99)
Potassium: 3.3 mmol/L — ABNORMAL LOW (ref 3.5–5.1)
Sodium: 135 mmol/L (ref 135–145)

## 2022-06-06 LAB — GLUCOSE, CAPILLARY: Glucose-Capillary: 99 mg/dL (ref 70–99)

## 2022-06-06 LAB — PHOSPHORUS: Phosphorus: 2.6 mg/dL (ref 2.5–4.6)

## 2022-06-06 MED ORDER — METOCLOPRAMIDE HCL 5 MG PO TABS: 5.0000 mg | ORAL_TABLET | Freq: Three times a day (TID) | ORAL | 0 refills | Status: DC

## 2022-06-06 MED ORDER — INSULIN GLARGINE-YFGN 100 UNIT/ML ~~LOC~~ SOLN: 10.0000 [IU] | Freq: Every day | SUBCUTANEOUS | 11 refills | Status: DC

## 2022-06-06 MED ORDER — LEVETIRACETAM 750 MG PO TABS: 750.0000 mg | ORAL_TABLET | Freq: Two times a day (BID) | ORAL | 1 refills | Status: DC

## 2022-06-06 MED ORDER — MAGNESIUM SULFATE 2 GM/50ML IV SOLN
2.0000 g | Freq: Once | INTRAVENOUS | Status: AC
  Administered 2022-06-06: 2 g via INTRAVENOUS
  Filled 2022-06-06: qty 50

## 2022-06-06 MED ORDER — POTASSIUM CHLORIDE CRYS ER 20 MEQ PO TBCR
40.0000 meq | EXTENDED_RELEASE_TABLET | Freq: Once | ORAL | Status: DC
  Filled 2022-06-06: qty 2

## 2022-06-06 MED ORDER — INSULIN ASPART 100 UNIT/ML IJ SOLN: 2.0000 [IU] | Freq: Three times a day (TID) | INTRAMUSCULAR | 11 refills | Status: DC

## 2022-06-06 MED ORDER — INSULIN PEN NEEDLE 29G X 12.7MM MISC: 10.0000 [IU] | Freq: Every day | 0 refills | Status: DC

## 2022-06-06 MED ORDER — INSULIN GLARGINE-YFGN 100 UNIT/ML ~~LOC~~ SOLN
10.0000 [IU] | Freq: Every day | SUBCUTANEOUS | Status: DC
  Administered 2022-06-06: 10 [IU] via SUBCUTANEOUS
  Filled 2022-06-06: qty 0.1

## 2022-06-06 MED ORDER — INSULIN ASPART 100 UNIT/ML IJ SOLN
2.0000 [IU] | Freq: Three times a day (TID) | INTRAMUSCULAR | Status: DC
  Administered 2022-06-06: 2 [IU] via SUBCUTANEOUS
  Filled 2022-06-06: qty 1

## 2022-06-06 MED ORDER — METOPROLOL TARTRATE 25 MG PO TABS: 12.5000 mg | ORAL_TABLET | Freq: Two times a day (BID) | ORAL | 1 refills | Status: DC

## 2022-06-06 MED ORDER — SODIUM CHLORIDE 0.9 % IV SOLN: INTRAVENOUS | Status: DC | PRN

## 2022-06-06 NOTE — Plan of Care (Signed)
Problem: Education: Goal: Knowledge of General Education information will improve Description: Including pain rating scale, medication(s)/side effects and non-pharmacologic comfort measures 06/06/2022 1049 by Rogelia Rohrer, LPN Outcome: Adequate for Discharge 06/06/2022 1048 by Rogelia Rohrer, LPN Outcome: Progressing 06/06/2022 1048 by Rogelia Rohrer, LPN Outcome: Progressing   Problem: Health Behavior/Discharge Planning: Goal: Ability to manage health-related needs will improve 06/06/2022 1049 by Rogelia Rohrer, LPN Outcome: Adequate for Discharge 06/06/2022 1048 by Rogelia Rohrer, LPN Outcome: Progressing 06/06/2022 1048 by Rogelia Rohrer, LPN Outcome: Progressing   Problem: Clinical Measurements: Goal: Ability to maintain clinical measurements within normal limits will improve 06/06/2022 1049 by Rogelia Rohrer, LPN Outcome: Adequate for Discharge 06/06/2022 1048 by Rogelia Rohrer, LPN Outcome: Progressing 06/06/2022 1048 by Rogelia Rohrer, LPN Outcome: Progressing Goal: Will remain free from infection 06/06/2022 1049 by Rogelia Rohrer, LPN Outcome: Adequate for Discharge 06/06/2022 1048 by Rogelia Rohrer, LPN Outcome: Progressing 06/06/2022 1048 by Rogelia Rohrer, LPN Outcome: Progressing Goal: Diagnostic test results will improve 06/06/2022 1049 by Rogelia Rohrer, LPN Outcome: Adequate for Discharge 06/06/2022 1048 by Rogelia Rohrer, LPN Outcome: Progressing 06/06/2022 1048 by Rogelia Rohrer, LPN Outcome: Progressing Goal: Respiratory complications will improve 06/06/2022 1049 by Rogelia Rohrer, LPN Outcome: Adequate for Discharge 06/06/2022 1048 by Rogelia Rohrer, LPN Outcome: Progressing 06/06/2022 1048 by Rogelia Rohrer, LPN Outcome: Progressing Goal: Cardiovascular complication will be avoided 06/06/2022 1049 by Rogelia Rohrer, LPN Outcome: Adequate for Discharge 06/06/2022 1048 by Rogelia Rohrer, LPN Outcome:  Progressing 06/06/2022 1048 by Rogelia Rohrer, LPN Outcome: Progressing   Problem: Activity: Goal: Risk for activity intolerance will decrease 06/06/2022 1049 by Rogelia Rohrer, LPN Outcome: Adequate for Discharge 06/06/2022 1048 by Rogelia Rohrer, LPN Outcome: Progressing 06/06/2022 1048 by Rogelia Rohrer, LPN Outcome: Progressing   Problem: Nutrition: Goal: Adequate nutrition will be maintained 06/06/2022 1049 by Rogelia Rohrer, LPN Outcome: Adequate for Discharge 06/06/2022 1048 by Rogelia Rohrer, LPN Outcome: Progressing 06/06/2022 1048 by Rogelia Rohrer, LPN Outcome: Progressing   Problem: Coping: Goal: Level of anxiety will decrease 06/06/2022 1049 by Rogelia Rohrer, LPN Outcome: Adequate for Discharge 06/06/2022 1048 by Rogelia Rohrer, LPN Outcome: Progressing 06/06/2022 1048 by Rogelia Rohrer, LPN Outcome: Progressing   Problem: Elimination: Goal: Will not experience complications related to bowel motility 06/06/2022 1049 by Rogelia Rohrer, LPN Outcome: Adequate for Discharge 06/06/2022 1048 by Rogelia Rohrer, LPN Outcome: Progressing 06/06/2022 1048 by Rogelia Rohrer, LPN Outcome: Progressing Goal: Will not experience complications related to urinary retention 06/06/2022 1049 by Rogelia Rohrer, LPN Outcome: Adequate for Discharge 06/06/2022 1048 by Rogelia Rohrer, LPN Outcome: Progressing 06/06/2022 1048 by Rogelia Rohrer, LPN Outcome: Progressing   Problem: Pain Managment: Goal: General experience of comfort will improve 06/06/2022 1049 by Rogelia Rohrer, LPN Outcome: Adequate for Discharge 06/06/2022 1048 by Rogelia Rohrer, LPN Outcome: Progressing 06/06/2022 1048 by Rogelia Rohrer, LPN Outcome: Progressing   Problem: Safety: Goal: Ability to remain free from injury will improve 06/06/2022 1049 by Rogelia Rohrer, LPN Outcome: Adequate for Discharge 06/06/2022 1048 by Rogelia Rohrer, LPN Outcome:  Progressing 06/06/2022 1048 by Rogelia Rohrer, LPN Outcome: Progressing   Problem: Skin Integrity: Goal: Risk for impaired skin integrity will decrease 06/06/2022 1049 by Rogelia Rohrer, LPN Outcome: Adequate for Discharge 06/06/2022 1048 by Rogelia Rohrer, LPN Outcome: Progressing 06/06/2022 1048 by Rogelia Rohrer, LPN  Outcome: Progressing   Problem: Education: Goal: Ability to describe self-care measures that may prevent or decrease complications (Diabetes Survival Skills Education) will improve 06/06/2022 1049 by Rogelia Rohrer, LPN Outcome: Adequate for Discharge 06/06/2022 1048 by Rogelia Rohrer, LPN Outcome: Progressing 06/06/2022 1048 by Rogelia Rohrer, LPN Outcome: Progressing Goal: Individualized Educational Video(s) 06/06/2022 1049 by Rogelia Rohrer, LPN Outcome: Adequate for Discharge 06/06/2022 1048 by Rogelia Rohrer, LPN Outcome: Progressing 06/06/2022 1048 by Rogelia Rohrer, LPN Outcome: Progressing   Problem: Coping: Goal: Ability to adjust to condition or change in health will improve 06/06/2022 1049 by Rogelia Rohrer, LPN Outcome: Adequate for Discharge 06/06/2022 1048 by Rogelia Rohrer, LPN Outcome: Progressing 06/06/2022 1048 by Rogelia Rohrer, LPN Outcome: Progressing   Problem: Fluid Volume: Goal: Ability to maintain a balanced intake and output will improve 06/06/2022 1049 by Rogelia Rohrer, LPN Outcome: Adequate for Discharge 06/06/2022 1048 by Rogelia Rohrer, LPN Outcome: Progressing 06/06/2022 1048 by Rogelia Rohrer, LPN Outcome: Progressing   Problem: Health Behavior/Discharge Planning: Goal: Ability to identify and utilize available resources and services will improve 06/06/2022 1049 by Rogelia Rohrer, LPN Outcome: Adequate for Discharge 06/06/2022 1048 by Rogelia Rohrer, LPN Outcome: Progressing 06/06/2022 1048 by Rogelia Rohrer, LPN Outcome: Progressing Goal: Ability to manage health-related  needs will improve 06/06/2022 1049 by Rogelia Rohrer, LPN Outcome: Adequate for Discharge 06/06/2022 1048 by Rogelia Rohrer, LPN Outcome: Progressing 06/06/2022 1048 by Rogelia Rohrer, LPN Outcome: Progressing   Problem: Metabolic: Goal: Ability to maintain appropriate glucose levels will improve 06/06/2022 1049 by Rogelia Rohrer, LPN Outcome: Adequate for Discharge 06/06/2022 1048 by Rogelia Rohrer, LPN Outcome: Progressing 06/06/2022 1048 by Rogelia Rohrer, LPN Outcome: Progressing   Problem: Nutritional: Goal: Maintenance of adequate nutrition will improve 06/06/2022 1049 by Rogelia Rohrer, LPN Outcome: Adequate for Discharge 06/06/2022 1048 by Rogelia Rohrer, LPN Outcome: Progressing 06/06/2022 1048 by Rogelia Rohrer, LPN Outcome: Progressing Goal: Progress toward achieving an optimal weight will improve 06/06/2022 1049 by Rogelia Rohrer, LPN Outcome: Adequate for Discharge 06/06/2022 1048 by Rogelia Rohrer, LPN Outcome: Progressing 06/06/2022 1048 by Rogelia Rohrer, LPN Outcome: Progressing   Problem: Skin Integrity: Goal: Risk for impaired skin integrity will decrease 06/06/2022 1049 by Rogelia Rohrer, LPN Outcome: Adequate for Discharge 06/06/2022 1048 by Rogelia Rohrer, LPN Outcome: Progressing 06/06/2022 1048 by Rogelia Rohrer, LPN Outcome: Progressing   Problem: Tissue Perfusion: Goal: Adequacy of tissue perfusion will improve 06/06/2022 1049 by Rogelia Rohrer, LPN Outcome: Adequate for Discharge 06/06/2022 1048 by Rogelia Rohrer, LPN Outcome: Progressing 06/06/2022 1048 by Rogelia Rohrer, LPN Outcome: Progressing   Problem: Education: Goal: Ability to describe self-care measures that may prevent or decrease complications (Diabetes Survival Skills Education) will improve 06/06/2022 1049 by Rogelia Rohrer, LPN Outcome: Adequate for Discharge 06/06/2022 1048 by Rogelia Rohrer, LPN Outcome: Progressing 06/06/2022  1048 by Rogelia Rohrer, LPN Outcome: Progressing Goal: Individualized Educational Video(s) 06/06/2022 1049 by Rogelia Rohrer, LPN Outcome: Adequate for Discharge 06/06/2022 1048 by Rogelia Rohrer, LPN Outcome: Progressing 06/06/2022 1048 by Rogelia Rohrer, LPN Outcome: Progressing   Problem: Cardiac: Goal: Ability to maintain an adequate cardiac output will improve 06/06/2022 1049 by Rogelia Rohrer, LPN Outcome: Adequate for Discharge 06/06/2022 1048 by Rogelia Rohrer, LPN Outcome: Progressing 06/06/2022 1048 by Rogelia Rohrer, LPN Outcome: Progressing   Problem: Health Behavior/Discharge Planning: Goal: Ability  to identify and utilize available resources and services will improve 06/06/2022 1049 by Rogelia Rohrer, LPN Outcome: Adequate for Discharge 06/06/2022 1048 by Rogelia Rohrer, LPN Outcome: Progressing 06/06/2022 1048 by Rogelia Rohrer, LPN Outcome: Progressing Goal: Ability to manage health-related needs will improve 06/06/2022 1049 by Rogelia Rohrer, LPN Outcome: Adequate for Discharge 06/06/2022 1048 by Rogelia Rohrer, LPN Outcome: Progressing 06/06/2022 1048 by Rogelia Rohrer, LPN Outcome: Progressing   Problem: Fluid Volume: Goal: Ability to achieve a balanced intake and output will improve 06/06/2022 1049 by Rogelia Rohrer, LPN Outcome: Adequate for Discharge 06/06/2022 1048 by Rogelia Rohrer, LPN Outcome: Progressing 06/06/2022 1048 by Rogelia Rohrer, LPN Outcome: Progressing   Problem: Metabolic: Goal: Ability to maintain appropriate glucose levels will improve 06/06/2022 1049 by Rogelia Rohrer, LPN Outcome: Adequate for Discharge 06/06/2022 1048 by Rogelia Rohrer, LPN Outcome: Progressing 06/06/2022 1048 by Rogelia Rohrer, LPN Outcome: Progressing   Problem: Nutritional: Goal: Maintenance of adequate nutrition will improve 06/06/2022 1049 by Rogelia Rohrer, LPN Outcome: Adequate for Discharge 06/06/2022  1048 by Rogelia Rohrer, LPN Outcome: Progressing 06/06/2022 1048 by Rogelia Rohrer, LPN Outcome: Progressing Goal: Maintenance of adequate weight for body size and type will improve 06/06/2022 1049 by Rogelia Rohrer, LPN Outcome: Adequate for Discharge 06/06/2022 1048 by Rogelia Rohrer, LPN Outcome: Progressing 06/06/2022 1048 by Rogelia Rohrer, LPN Outcome: Progressing   Problem: Respiratory: Goal: Will regain and/or maintain adequate ventilation 06/06/2022 1049 by Rogelia Rohrer, LPN Outcome: Adequate for Discharge 06/06/2022 1048 by Rogelia Rohrer, LPN Outcome: Progressing 06/06/2022 1048 by Rogelia Rohrer, LPN Outcome: Progressing   Problem: Urinary Elimination: Goal: Ability to achieve and maintain adequate renal perfusion and functioning will improve 06/06/2022 1049 by Rogelia Rohrer, LPN Outcome: Adequate for Discharge 06/06/2022 1048 by Rogelia Rohrer, LPN Outcome: Progressing 06/06/2022 1048 by Rogelia Rohrer, LPN Outcome: Progressing

## 2022-06-06 NOTE — Discharge Summary (Signed)
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 *  Hospital Course: Rachael Gray is a 42 y.o. female with medical history significant of diabetes mellitus, gastroparesis, cannabinoidal hyperemesis syndrome, GERD, anxiety, seizure/pseudoseizure, lupus, tobacco abuse, duodenitis, esophagitis, GERD,  anemia, DKA, substance abuse, who presents with seizure and  shaking.   Pt states that she had 3- 4 seizures at home, described as "tonic-clonic seizure" by patient.  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  She is supposed to take Keppra 750 mg twice daily, but she has not taken this medication for more than half year.  Pt is constantly shaking back-and-forth in bed in the ED.  No seizure activity during the interview.  States that when she has pain, she keeps shaking her body. Patient states that she has nausea, vomiting and abdominal pain.  She has multiple episodes of bilious vomiting.  No diarrhea.  Her abdominal pain is diffuse, constant, severe, nonradiating.  Denies symptoms of UTI.  No fever or chills.  No chest pain, cough, shortness breath.     Assessment and Plan:  DKA (diabetic ketoacidosis) ([place]) Patient presented with signs of mild DKA with elevated blood sugar and  elevated anion gap and beta hydroxybutyric acid.   Anion gap has closed.  BHB trending down.  Bicarbonate improving.  Suspect secondary to medication nonadherence. Patient was supposed to be on Novolin 70/30 10 units twice a day but has been noncompliant. She will be discharged home on Lantus 10 units daily and Humalog 2 units with meals. Continue carb modified diet Patient has been counseled on the need to be compliant with her prescribed medications.    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   Patient is supposed to take NPH insulin 10 units twice daily, but she has not taken any insulin for a long time Will discharge patient home on Lantus 10 units daily and Humalog 2 units AC meals   Diabetic gastroparesis Intractable abdominal pain Intractable nausea vomiting Resolved Nausea, vomiting and abdominal pain are most likely due to multifactorial etiology, including history of duodenitis, gastroparesis, cannabinoid hyperemesis syndrome.   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  She has consistent shaking activity that is not consistent with true seizure.  She is mentating clearly, sitting up, maintaining airway during the episodes.  Received 1 g Keppra load in ED.  CT head negative Continue Keppra 750 mg p.o. twice daily Ativan as needed breakthrough seizure activity Seizure  precautions Can consider consultation with neurology or psychiatry during hospitalization if symptoms do not improve     Duodenitis -Protonix 40 mg twice daily -Carafate   Leukocytosis:  WBC 18.2 on admission, no  fever, no source of infection identified.  Likely reactive Normalized           Consultants: None Procedures performed: None Disposition: Home Diet recommendation:  Discharge Diet Orders (From admission, onward)     Start     Ordered   06/06/22 0000  Diet Carb Modified        06/06/22 1109           Carb modified diet DISCHARGE MEDICATION: Allergies as of 06/06/2022       Reactions   Dilantin [phenytoin Sodium Extended] Hives   Doxycycline Swelling   Other    Tomato Rash   Tylenol [acetaminophen] Rash        Medication List     STOP taking these medications    cephALEXin 500 MG capsule Commonly known as: KEFLEX   NovoLIN 70/30 (70-30) 100 UNIT/ML injection Generic drug: insulin NPH-regular Human   ondansetron 4 MG disintegrating tablet Commonly known as: ZOFRAN-ODT   pantoprazole 40 MG tablet Commonly known as: Protonix   sucralfate 1 g tablet Commonly known as: Carafate       TAKE these medications    insulin aspart 100 UNIT/ML injection Commonly known as: novoLOG Inject 2 Units into the skin 3 (three) times daily with meals.   insulin glargine-yfgn 100 UNIT/ML injection Commonly known as: SEMGLEE Inject 0.1 mLs (10 Units total) into the skin daily. Start taking on: June 07, 2022   Insulin Pen Needle 29G X 12.7MM Misc 10 Units by Does not apply route daily.   levETIRAcetam 750 MG tablet Commonly known as: KEPPRA Take 1 tablet (750 mg total) by mouth 2 (two) times daily.   metoCLOPramide 5 MG tablet Commonly known as: Reglan Take 1 tablet (5 mg total) by mouth 4 (four) times daily -  before meals and at bedtime for 5 days. What changed:  medication strength how much to take when to take this reasons to take this   metoprolol tartrate 25 MG tablet Commonly known as: LOPRESSOR Take 0.5 tablets (12.5 mg total) by mouth 2 (two) times daily.        Discharge Exam: Filed Weights   06/03/22 1037 06/03/22 1600  Weight: 61 kg  58.5 kg    Constitutional:      Appearance: Normal appearance.  HENT:     Head: Normocephalic and atraumatic.     Nose: Nose normal.     Mouth/Throat:     Mouth: Mucous membranes are moist.  Eyes:     Conjunctiva/sclera: Conjunctivae normal.  Cardiovascular:     Rate and Rhythm: Normal rate and regular rhythm.  Pulmonary:     Effort: Pulmonary effort is normal.     Breath sounds: Normal breath sounds.  Abdominal:     General: Abdomen is flat. Bowel sounds are normal.     Palpations: Abdomen is soft.  Musculoskeletal:        General: Normal range of motion.     Cervical back: Normal range of motion and neck supple.  Skin:    General: Skin is warm and dry.  Neurological:     General: No focal deficit present.     Mental Status: She is alert.  Psychiatric:        Mood and Affect: Mood normal.  Behavior: Behavior normal.      Condition at discharge: stable  The results of significant diagnostics from this hospitalization (including imaging, microbiology, ancillary and laboratory) are listed below for reference.   Imaging Studies: CT ABDOMEN PELVIS WO CONTRAST  Result Date: 06/04/2022 CLINICAL DATA:  Abdominal pain, acute, nonlocalized. Nausea, vomiting and abdominal pain. Bilious vomiting. EXAM: CT ABDOMEN AND PELVIS WITHOUT CONTRAST TECHNIQUE: Multidetector CT imaging of the abdomen and pelvis was performed following the standard protocol without IV contrast. RADIATION DOSE REDUCTION: This exam was performed according to the departmental dose-optimization program which includes automated exposure control, adjustment of the mA and/or kV according to patient size and/or use of iterative reconstruction technique. COMPARISON:  CT abdomen/pelvis 10/07/2021. FINDINGS: Lower chest: No acute abnormality. Hepatobiliary: No focal liver abnormality is seen. Status post cholecystectomy. No biliary dilatation. Pancreas: Unremarkable. No pancreatic ductal dilatation or surrounding  inflammatory changes. Spleen: Normal in size without focal abnormality. Adrenals/Urinary Tract: Unremarkable adrenal glands. Punctate calyceal calculus in the interpolar left kidney. No hydronephrosis. Bladder is unremarkable. Stomach/Bowel: Normal stomach and duodenum. No dilated loops of small bowel. Appendix is not visualized. No bowel wall thickening or surrounding inflammation. Vascular/Lymphatic: Atherosclerotic calcifications of the abdominal aorta and its branches. No abdominal or pelvic lymphadenopathy. Reproductive: Uterus and bilateral adnexa are unremarkable. Other: No abdominal wall hernia or abnormality. No abdominopelvic ascites. Musculoskeletal: No acute or significant osseous findings. IMPRESSION: 1. No acute abdominopelvic abnormality. 2. Punctate calyceal calculus in the interpolar left kidney. No hydronephrosis. 3. Aortic Atherosclerosis (ICD10-I70.0). Electronically Signed   By: Emmit Alexanders M.D.   On: 06/04/2022 09:51   CT HEAD WO CONTRAST (5MM)  Result Date: 06/04/2022 CLINICAL DATA:  Seizure disorder, clinical change EXAM: CT HEAD WITHOUT CONTRAST TECHNIQUE: Contiguous axial images were obtained from the base of the skull through the vertex without intravenous contrast. RADIATION DOSE REDUCTION: This exam was performed according to the departmental dose-optimization program which includes automated exposure control, adjustment of the mA and/or kV according to patient size and/or use of iterative reconstruction technique. COMPARISON:  CT head 10/07/2021 FINDINGS: Brain: No evidence of large-territorial acute infarction. No parenchymal hemorrhage. No mass lesion. No extra-axial collection. No mass effect or midline shift. No hydrocephalus. Basilar cisterns are patent. Vascular: No hyperdense vessel. Skull: No acute fracture or focal lesion. Sinuses/Orbits: Paranasal sinuses and mastoid air cells are clear. The orbits are unremarkable. Other: None. IMPRESSION: No acute intracranial  abnormality. Electronically Signed   By: Iven Finn M.D.   On: 06/04/2022 00:32    Microbiology: Results for orders placed or performed during the hospital encounter of 06/03/22  Resp panel by RT-PCR (RSV, Flu A&B, Covid) Anterior Nasal Swab     Status: None   Collection Time: 06/03/22 10:26 AM   Specimen: Anterior Nasal Swab  Result Value Ref Range Status   SARS Coronavirus 2 by RT PCR NEGATIVE NEGATIVE Final    Comment: (NOTE) SARS-CoV-2 target nucleic acids are NOT DETECTED.  The SARS-CoV-2 RNA is generally detectable in upper respiratory specimens during the acute phase of infection. The lowest concentration of SARS-CoV-2 viral copies this assay can detect is 138 copies/mL. A negative result does not preclude SARS-Cov-2 infection and should not be used as the sole basis for treatment or other patient management decisions. A negative result may occur with  improper specimen collection/handling, submission of specimen other than nasopharyngeal swab, presence of viral mutation(s) within the areas targeted by this assay, and inadequate number of viral copies(<138 copies/mL). A negative result must be  combined with clinical observations, patient history, and epidemiological information. The expected result is Negative.  Fact Sheet for Patients:  EntrepreneurPulse.com.au  Fact Sheet for Healthcare Providers:  IncredibleEmployment.be  This test is no t yet approved or cleared by the [place] FDA and  has been authorized for detection and/or diagnosis of SARS-CoV-2 by FDA under an Emergency Use Authorization (EUA). This EUA will remain  in effect (meaning this test can be used) for the duration of the COVID-19 declaration under Section 564(b)(1) of the Act, 21 U.S.C.section 360bbb-3(b)(1), unless the authorization is terminated  or revoked sooner.       Influenza A by PCR NEGATIVE NEGATIVE Final   Influenza B by PCR NEGATIVE NEGATIVE  Final    Comment: (NOTE) The Xpert Xpress SARS-CoV-2/FLU/RSV plus assay is intended as an aid in the diagnosis of influenza from Nasopharyngeal swab specimens and should not be used as a sole basis for treatment. Nasal washings and aspirates are unacceptable for Xpert Xpress SARS-CoV-2/FLU/RSV testing.  Fact Sheet for Patients: EntrepreneurPulse.com.au  Fact Sheet for Healthcare Providers: IncredibleEmployment.be  This test is not yet approved or cleared by the [place] FDA and has been authorized for detection and/or diagnosis of SARS-CoV-2 by FDA under an Emergency Use Authorization (EUA). This EUA will remain in effect (meaning this test can be used) for the duration of the COVID-19 declaration under Section 564(b)(1) of the Act, 21 U.S.C. section 360bbb-3(b)(1), unless the authorization is terminated or revoked.     Resp Syncytial Virus by PCR NEGATIVE NEGATIVE Final    Comment: (NOTE) Fact Sheet for Patients: EntrepreneurPulse.com.au  Fact Sheet for Healthcare Providers: IncredibleEmployment.be  This test is not yet approved or cleared by the [place] FDA and has been authorized for detection and/or diagnosis of SARS-CoV-2 by FDA under an Emergency Use Authorization (EUA). This EUA will remain in effect (meaning this test can be used) for the duration of the COVID-19 declaration under Section 564(b)(1) of the Act, 21 U.S.C. section 360bbb-3(b)(1), unless the authorization is terminated or revoked.  Performed at [facility], [address] [place], [place] [address]   MRSA Next Gen by PCR, Nasal     Status: None   Collection Time: 06/03/22  4:30 PM   Specimen: Nasal Mucosa; Nasal Swab  Result Value Ref Range Status   MRSA by PCR Next Gen NOT DETECTED NOT DETECTED Final    Comment: (NOTE) The GeneXpert MRSA Assay (FDA approved for NASAL specimens only), is one component of a  comprehensive MRSA colonization surveillance program. It is not intended to diagnose MRSA infection nor to guide or monitor treatment for MRSA infections. Test performance is not FDA approved in patients less than 26 years old. Performed at [facility], [address] [place], [place] [address]     Labs: CBC: Recent Labs  Lab 06/02/22 2015 06/03/22 1058 06/04/22 0533 06/05/22 0710  WBC 13.5* 18.2* 16.5* 9.3  NEUTROABS  --  16.0*  --  4.9  HGB 13.5 14.2 12.3 11.4*  HCT 40.9 42.8 37.5 34.6*  MCV 87.0 86.8 88.2 87.8  PLT 238 256 172 [id number]   Basic Metabolic Panel: Recent Labs  Lab 06/03/22 1637 06/03/22 2132 06/04/22 0515 06/04/22 0533 06/05/22 0710 06/05/22 1935 06/06/22 0520  NA 135   < > 135 137 135 137 135  K 3.4*   < > 3.5 3.2* 2.7* 3.4* 3.3*  CL 98   < > 99 98 99 99 102  CO2 22   < >  $'22 29 30 29 28  'w$ GLUCOSE 346*   < > 154* 156* 267* 180* 84  BUN 17   < > '11 12 7 '$ 5* 7  CREATININE 0.76   < > 0.61 0.61 0.58 0.65 0.52  CALCIUM 8.8*   < > 8.3* 8.9 8.7* 8.6* 8.7*  MG 2.0  --   --  2.5* 1.7 1.8 1.6*  PHOS 3.0  --   --   --  2.0* 2.9 2.6   < > = values in this interval not displayed.   Liver Function Tests: Recent Labs  Lab 06/03/22 1058  AST 27  ALT 11  ALKPHOS 57  BILITOT 0.8  PROT 9.1*  ALBUMIN 4.8   CBG: Recent Labs  Lab 06/05/22 1658 06/05/22 2041 06/05/22 2116 06/05/22 2132 06/06/22 0822  GLUCAP 260* 64* 69* 87 99    Discharge time spent: greater than 30 minutes.  Signed: Collier [place], MD [facility] 06/06/2022

## 2022-06-24 DIAGNOSIS — Z419 Encounter for procedure for purposes other than remedying health state, unspecified: Secondary | ICD-10-CM | POA: Diagnosis not present

## 2022-07-15 ENCOUNTER — Observation Stay
Admission: EM | Admit: 2022-07-15 | Discharge: 2022-07-16 | DRG: 880 | Disposition: A | Discharge status: Home / Self Care | Payer: Medicaid Other | Attending: Internal Medicine | Admitting: Internal Medicine

## 2022-07-15 DIAGNOSIS — E1165 Type 2 diabetes mellitus with hyperglycemia: Secondary | ICD-10-CM | POA: Diagnosis not present

## 2022-07-15 DIAGNOSIS — N390 Urinary tract infection, site not specified: Secondary | ICD-10-CM

## 2022-07-15 DIAGNOSIS — F1290 Cannabis use, unspecified, uncomplicated: Secondary | ICD-10-CM | POA: Diagnosis present

## 2022-07-15 DIAGNOSIS — F17210 Nicotine dependence, cigarettes, uncomplicated: Secondary | ICD-10-CM | POA: Diagnosis present

## 2022-07-15 DIAGNOSIS — R569 Unspecified convulsions: Secondary | ICD-10-CM | POA: Diagnosis not present

## 2022-07-15 DIAGNOSIS — Z881 Allergy status to other antibiotic agents status: Secondary | ICD-10-CM

## 2022-07-15 DIAGNOSIS — R1110 Vomiting, unspecified: Secondary | ICD-10-CM | POA: Diagnosis present

## 2022-07-15 DIAGNOSIS — R4182 Altered mental status, unspecified: Secondary | ICD-10-CM | POA: Diagnosis not present

## 2022-07-15 DIAGNOSIS — R000 Tachycardia, unspecified: Secondary | ICD-10-CM | POA: Diagnosis not present

## 2022-07-15 DIAGNOSIS — I10 Essential (primary) hypertension: Secondary | ICD-10-CM | POA: Insufficient documentation

## 2022-07-15 DIAGNOSIS — Z888 Allergy status to other drugs, medicaments and biological substances status: Secondary | ICD-10-CM

## 2022-07-15 DIAGNOSIS — Z794 Long term (current) use of insulin: Secondary | ICD-10-CM

## 2022-07-15 DIAGNOSIS — Z91018 Allergy to other foods: Secondary | ICD-10-CM

## 2022-07-15 DIAGNOSIS — Z9049 Acquired absence of other specified parts of digestive tract: Secondary | ICD-10-CM

## 2022-07-15 DIAGNOSIS — R112 Nausea with vomiting, unspecified: Secondary | ICD-10-CM

## 2022-07-15 DIAGNOSIS — E1143 Type 2 diabetes mellitus with diabetic autonomic (poly)neuropathy: Secondary | ICD-10-CM | POA: Diagnosis present

## 2022-07-15 DIAGNOSIS — A4150 Gram-negative sepsis, unspecified: Secondary | ICD-10-CM | POA: Diagnosis present

## 2022-07-15 DIAGNOSIS — K3184 Gastroparesis: Secondary | ICD-10-CM | POA: Diagnosis present

## 2022-07-15 DIAGNOSIS — M329 Systemic lupus erythematosus, unspecified: Secondary | ICD-10-CM | POA: Diagnosis present

## 2022-07-15 DIAGNOSIS — G40909 Epilepsy, unspecified, not intractable, without status epilepticus: Secondary | ICD-10-CM

## 2022-07-15 DIAGNOSIS — Z79899 Other long term (current) drug therapy: Secondary | ICD-10-CM | POA: Diagnosis not present

## 2022-07-15 DIAGNOSIS — Z743 Need for continuous supervision: Secondary | ICD-10-CM | POA: Diagnosis not present

## 2022-07-15 DIAGNOSIS — R0689 Other abnormalities of breathing: Secondary | ICD-10-CM | POA: Diagnosis not present

## 2022-07-15 DIAGNOSIS — Z886 Allergy status to analgesic agent status: Secondary | ICD-10-CM

## 2022-07-15 DIAGNOSIS — E878 Other disorders of electrolyte and fluid balance, not elsewhere classified: Secondary | ICD-10-CM | POA: Diagnosis not present

## 2022-07-15 DIAGNOSIS — F445 Conversion disorder with seizures or convulsions: Secondary | ICD-10-CM | POA: Diagnosis not present

## 2022-07-15 DIAGNOSIS — K219 Gastro-esophageal reflux disease without esophagitis: Secondary | ICD-10-CM | POA: Diagnosis present

## 2022-07-15 DIAGNOSIS — R739 Hyperglycemia, unspecified: Secondary | ICD-10-CM

## 2022-07-15 DIAGNOSIS — R404 Transient alteration of awareness: Secondary | ICD-10-CM | POA: Diagnosis not present

## 2022-07-15 NOTE — ED Triage Notes (Signed)
Pt presents to ER via ems from home with c/o non-stop seizures that started around 2230 tonight, witnessed by daughter.  Per ems pt has hx of seizures, and takes keppra at home.  Pt had been feeling sick today, and unknown if pt has taken all her meds today.  Ems reports pt seized for appx 15 min in their presence.  Reports pt had grand mal seizures.  Pt given  IM versed, 2.5mg  IV versed, and  zofran for n/v en route.  Pt is A&O x3 on arrival. Speech noted to be slurred.

## 2022-07-16 ENCOUNTER — Emergency Department: Payer: Medicaid Other

## 2022-07-16 ENCOUNTER — Ambulatory Visit: Payer: Medicaid Other

## 2022-07-16 ENCOUNTER — Other Ambulatory Visit: Payer: Self-pay

## 2022-07-16 DIAGNOSIS — Z888 Allergy status to other drugs, medicaments and biological substances status: Secondary | ICD-10-CM | POA: Diagnosis not present

## 2022-07-16 DIAGNOSIS — F1290 Cannabis use, unspecified, uncomplicated: Secondary | ICD-10-CM | POA: Diagnosis present

## 2022-07-16 DIAGNOSIS — A4150 Gram-negative sepsis, unspecified: Secondary | ICD-10-CM | POA: Diagnosis present

## 2022-07-16 DIAGNOSIS — R4182 Altered mental status, unspecified: Secondary | ICD-10-CM

## 2022-07-16 DIAGNOSIS — K219 Gastro-esophageal reflux disease without esophagitis: Secondary | ICD-10-CM | POA: Diagnosis present

## 2022-07-16 DIAGNOSIS — I10 Essential (primary) hypertension: Secondary | ICD-10-CM | POA: Diagnosis not present

## 2022-07-16 DIAGNOSIS — Z9049 Acquired absence of other specified parts of digestive tract: Secondary | ICD-10-CM | POA: Diagnosis not present

## 2022-07-16 DIAGNOSIS — R1110 Vomiting, unspecified: Secondary | ICD-10-CM | POA: Diagnosis present

## 2022-07-16 DIAGNOSIS — E1143 Type 2 diabetes mellitus with diabetic autonomic (poly)neuropathy: Secondary | ICD-10-CM | POA: Diagnosis present

## 2022-07-16 DIAGNOSIS — Z886 Allergy status to analgesic agent status: Secondary | ICD-10-CM | POA: Diagnosis not present

## 2022-07-16 DIAGNOSIS — N390 Urinary tract infection, site not specified: Secondary | ICD-10-CM | POA: Diagnosis not present

## 2022-07-16 DIAGNOSIS — K3184 Gastroparesis: Secondary | ICD-10-CM | POA: Diagnosis present

## 2022-07-16 DIAGNOSIS — Z794 Long term (current) use of insulin: Secondary | ICD-10-CM

## 2022-07-16 DIAGNOSIS — R569 Unspecified convulsions: Secondary | ICD-10-CM | POA: Diagnosis not present

## 2022-07-16 DIAGNOSIS — F17210 Nicotine dependence, cigarettes, uncomplicated: Secondary | ICD-10-CM | POA: Diagnosis present

## 2022-07-16 DIAGNOSIS — Z79899 Other long term (current) drug therapy: Secondary | ICD-10-CM | POA: Diagnosis not present

## 2022-07-16 DIAGNOSIS — G40909 Epilepsy, unspecified, not intractable, without status epilepticus: Secondary | ICD-10-CM

## 2022-07-16 DIAGNOSIS — E878 Other disorders of electrolyte and fluid balance, not elsewhere classified: Secondary | ICD-10-CM | POA: Diagnosis present

## 2022-07-16 DIAGNOSIS — Z91018 Allergy to other foods: Secondary | ICD-10-CM | POA: Diagnosis not present

## 2022-07-16 DIAGNOSIS — E1165 Type 2 diabetes mellitus with hyperglycemia: Secondary | ICD-10-CM | POA: Diagnosis not present

## 2022-07-16 DIAGNOSIS — F445 Conversion disorder with seizures or convulsions: Secondary | ICD-10-CM | POA: Diagnosis present

## 2022-07-16 DIAGNOSIS — Z881 Allergy status to other antibiotic agents status: Secondary | ICD-10-CM | POA: Diagnosis not present

## 2022-07-16 DIAGNOSIS — M329 Systemic lupus erythematosus, unspecified: Secondary | ICD-10-CM | POA: Diagnosis present

## 2022-07-16 LAB — CBC
HCT: 42.2 % (ref 36.0–46.0)
HCT: 42.9 % (ref 36.0–46.0)
Hemoglobin: 13.8 g/dL (ref 12.0–15.0)
Hemoglobin: 14.1 g/dL (ref 12.0–15.0)
MCH: 28.3 pg (ref 26.0–34.0)
MCH: 28.5 pg (ref 26.0–34.0)
MCHC: 32.7 g/dL (ref 30.0–36.0)
MCHC: 32.9 g/dL (ref 30.0–36.0)
MCV: 86.7 fL (ref 80.0–100.0)
MCV: 86.7 fL (ref 80.0–100.0)
Platelets: 253 10*3/uL (ref 150–400)
Platelets: 293 10*3/uL (ref 150–400)
RBC: 4.87 MIL/uL (ref 3.87–5.11)
RBC: 4.95 MIL/uL (ref 3.87–5.11)
RDW: 12.8 % (ref 11.5–15.5)
RDW: 12.8 % (ref 11.5–15.5)
WBC: 12.3 10*3/uL — ABNORMAL HIGH (ref 4.0–10.5)
WBC: 12.7 10*3/uL — ABNORMAL HIGH (ref 4.0–10.5)
nRBC: 0 % (ref 0.0–0.2)
nRBC: 0 % (ref 0.0–0.2)

## 2022-07-16 LAB — COMPREHENSIVE METABOLIC PANEL
ALT: 10 U/L (ref 0–44)
AST: 17 U/L (ref 15–41)
Albumin: 4.1 g/dL (ref 3.5–5.0)
Alkaline Phosphatase: 51 U/L (ref 38–126)
Anion gap: 15 (ref 5–15)
BUN: 15 mg/dL (ref 6–20)
CO2: 25 mmol/L (ref 22–32)
Calcium: 9.3 mg/dL (ref 8.9–10.3)
Chloride: 95 mmol/L — ABNORMAL LOW (ref 98–111)
Creatinine, Ser: 0.78 mg/dL (ref 0.44–1.00)
GFR, Estimated: >60 mL/min (ref >60)
Glucose, Bld: 346 mg/dL — ABNORMAL HIGH (ref 70–99)
Potassium: 3.6 mmol/L (ref 3.5–5.1)
Sodium: 135 mmol/L (ref 135–145)
Total Bilirubin: 0.9 mg/dL (ref 0.3–1.2)
Total Protein: 7.8 g/dL (ref 6.5–8.1)

## 2022-07-16 LAB — URINALYSIS, ROUTINE W REFLEX MICROSCOPIC
Bacteria, UA: NONE SEEN
Bilirubin Urine: NEGATIVE
Glucose, UA: >=500 mg/dL — AB
Ketones, ur: 20 mg/dL — AB
Nitrite: NEGATIVE
Protein, ur: 100 mg/dL — AB
Specific Gravity, Urine: 1.032 — ABNORMAL HIGH (ref 1.005–1.030)
pH: 6 (ref 5.0–8.0)

## 2022-07-16 LAB — BASIC METABOLIC PANEL
Anion gap: 18 — ABNORMAL HIGH (ref 5–15)
BUN: 15 mg/dL (ref 6–20)
CO2: 20 mmol/L — ABNORMAL LOW (ref 22–32)
Calcium: 9.3 mg/dL (ref 8.9–10.3)
Chloride: 97 mmol/L — ABNORMAL LOW (ref 98–111)
Creatinine, Ser: 0.64 mg/dL (ref 0.44–1.00)
GFR, Estimated: >60 mL/min (ref >60)
Glucose, Bld: 298 mg/dL — ABNORMAL HIGH (ref 70–99)
Potassium: 4.1 mmol/L (ref 3.5–5.1)
Sodium: 135 mmol/L (ref 135–145)

## 2022-07-16 LAB — URINE DRUG SCREEN, QUALITATIVE (ARMC ONLY)
Amphetamines, Ur Screen: NOT DETECTED
Barbiturates, Ur Screen: NOT DETECTED
Benzodiazepine, Ur Scrn: POSITIVE — AB
Cannabinoid 50 Ng, Ur ~~LOC~~: POSITIVE — AB
Cocaine Metabolite,Ur ~~LOC~~: NOT DETECTED
MDMA (Ecstasy)Ur Screen: NOT DETECTED
Methadone Scn, Ur: NOT DETECTED
Opiate, Ur Screen: NOT DETECTED
Phencyclidine (PCP) Ur S: NOT DETECTED

## 2022-07-16 LAB — POC URINE PREG, ED: Preg Test, Ur: NEGATIVE

## 2022-07-16 LAB — MAGNESIUM: Magnesium: 1.8 mg/dL (ref 1.7–2.4)

## 2022-07-16 LAB — BLOOD GAS, VENOUS
Acid-Base Excess: 5.3 mmol/L — ABNORMAL HIGH (ref 0.0–2.0)
Bicarbonate: 31.1 mmol/L — ABNORMAL HIGH (ref 20.0–28.0)
O2 Saturation: 47.1 %
Patient temperature: 37
pCO2, Ven: 49 mmHg (ref 44–60)
pH, Ven: 7.41 (ref 7.25–7.43)
pO2, Ven: 32 mmHg (ref 32–45)

## 2022-07-16 LAB — PROCALCITONIN: Procalcitonin: <0.1 ng/mL

## 2022-07-16 LAB — GLUCOSE, CAPILLARY
Glucose-Capillary: 287 mg/dL — ABNORMAL HIGH (ref 70–99)
Glucose-Capillary: 274 mg/dL — ABNORMAL HIGH (ref 70–99)

## 2022-07-16 LAB — CORTISOL-AM, BLOOD: Cortisol - AM: 51 ug/dL — ABNORMAL HIGH (ref 6.7–22.6)

## 2022-07-16 LAB — BETA-HYDROXYBUTYRIC ACID: Beta-Hydroxybutyric Acid: 0.89 mmol/L — ABNORMAL HIGH (ref 0.05–0.27)

## 2022-07-16 LAB — SALICYLATE LEVEL: Salicylate Lvl: <7 mg/dL — ABNORMAL LOW (ref 7.0–30.0)

## 2022-07-16 LAB — ACETAMINOPHEN LEVEL: Acetaminophen (Tylenol), Serum: <10 ug/mL — ABNORMAL LOW (ref 10–30)

## 2022-07-16 LAB — PROTIME-INR
INR: 1.1 (ref 0.8–1.2)
Prothrombin Time: 14.5 seconds (ref 11.4–15.2)

## 2022-07-16 LAB — ETHANOL: Alcohol, Ethyl (B): <10 mg/dL (ref <10)

## 2022-07-16 MED ORDER — ONDANSETRON HCL 4 MG PO TABS: 4.0000 mg | ORAL_TABLET | Freq: Four times a day (QID) | ORAL | Status: DC | PRN

## 2022-07-16 MED ORDER — ENOXAPARIN SODIUM 40 MG/0.4ML IJ SOSY
40.0000 mg | PREFILLED_SYRINGE | INTRAMUSCULAR | Status: DC
  Administered 2022-07-16: 40 mg via SUBCUTANEOUS
  Filled 2022-07-16: qty 0.4

## 2022-07-16 MED ORDER — SODIUM CHLORIDE 0.9 % IV SOLN
1.0000 g | Freq: Once | INTRAVENOUS | Status: AC
  Administered 2022-07-16: 1 g via INTRAVENOUS
  Filled 2022-07-16: qty 10

## 2022-07-16 MED ORDER — LORAZEPAM 2 MG/ML PO CONC: 2.0000 mg | Freq: Once | ORAL | Status: DC

## 2022-07-16 MED ORDER — SODIUM CHLORIDE 0.9 % IV SOLN
150.0000 mL/h | INTRAVENOUS | Status: DC
  Administered 2022-07-16: 150 mL/h via INTRAVENOUS

## 2022-07-16 MED ORDER — SODIUM CHLORIDE 0.9 % IV SOLN: 2.0000 g | INTRAVENOUS | Status: DC

## 2022-07-16 MED ORDER — GLUCERNA SHAKE PO LIQD
237.0000 mL | Freq: Three times a day (TID) | ORAL | Status: DC
  Administered 2022-07-16: 237 mL via ORAL

## 2022-07-16 MED ORDER — INSULIN GLARGINE-YFGN 100 UNIT/ML ~~LOC~~ SOLN
10.0000 [IU] | Freq: Every day | SUBCUTANEOUS | Status: DC
  Administered 2022-07-16: 10 [IU] via SUBCUTANEOUS
  Filled 2022-07-16: qty 0.1

## 2022-07-16 MED ORDER — TRAZODONE HCL 50 MG PO TABS: 25.0000 mg | ORAL_TABLET | Freq: Every evening | ORAL | Status: DC | PRN

## 2022-07-16 MED ORDER — INSULIN ASPART 100 UNIT/ML IJ SOLN: 0.0000 [IU] | Freq: Every day | INTRAMUSCULAR | Status: DC

## 2022-07-16 MED ORDER — LEVETIRACETAM 750 MG PO TABS
750.0000 mg | ORAL_TABLET | Freq: Two times a day (BID) | ORAL | Status: DC
  Administered 2022-07-16: 750 mg via ORAL
  Filled 2022-07-16: qty 1

## 2022-07-16 MED ORDER — MAGNESIUM HYDROXIDE 400 MG/5ML PO SUSP: 30.0000 mL | Freq: Every day | ORAL | Status: DC | PRN

## 2022-07-16 MED ORDER — LORAZEPAM 2 MG/ML IJ SOLN
INTRAMUSCULAR | Status: AC
  Filled 2022-07-16: qty 1

## 2022-07-16 MED ORDER — INSULIN ASPART 100 UNIT/ML IJ SOLN
0.0000 [IU] | Freq: Three times a day (TID) | INTRAMUSCULAR | Status: DC
  Administered 2022-07-16: 5 [IU] via SUBCUTANEOUS
  Filled 2022-07-16: qty 1

## 2022-07-16 MED ORDER — METOCLOPRAMIDE HCL 5 MG PO TABS
5.0000 mg | ORAL_TABLET | Freq: Three times a day (TID) | ORAL | Status: DC
  Administered 2022-07-16: 5 mg via ORAL
  Filled 2022-07-16 (×2): qty 1

## 2022-07-16 MED ORDER — ONDANSETRON HCL 4 MG/2ML IJ SOLN: 4.0000 mg | Freq: Four times a day (QID) | INTRAMUSCULAR | Status: DC | PRN

## 2022-07-16 MED ORDER — METOPROLOL TARTRATE 25 MG PO TABS
12.5000 mg | ORAL_TABLET | Freq: Two times a day (BID) | ORAL | Status: DC
  Administered 2022-07-16: 12.5 mg via ORAL
  Filled 2022-07-16: qty 1

## 2022-07-16 MED ORDER — METOCLOPRAMIDE HCL 5 MG PO TABS: 5.0000 mg | ORAL_TABLET | Freq: Four times a day (QID) | ORAL | Status: DC | PRN

## 2022-07-16 MED ORDER — LORAZEPAM 2 MG/ML IJ SOLN: 1.0000 mg | INTRAMUSCULAR | Status: DC | PRN

## 2022-07-16 MED ORDER — SODIUM CHLORIDE 0.9 % IV SOLN
750.0000 mg | Freq: Once | INTRAVENOUS | Status: AC
  Administered 2022-07-16: 750 mg via INTRAVENOUS
  Filled 2022-07-16: qty 7.5

## 2022-07-16 MED ORDER — ADULT MULTIVITAMIN W/MINERALS CH
1.0000 | ORAL_TABLET | Freq: Every day | ORAL | Status: DC
  Administered 2022-07-16: 1 via ORAL
  Filled 2022-07-16: qty 1

## 2022-07-16 MED ORDER — SODIUM CHLORIDE 0.9 % IV BOLUS
1000.0000 mL | Freq: Once | INTRAVENOUS | Status: AC
  Administered 2022-07-16: 1000 mL via INTRAVENOUS

## 2022-07-16 MED ORDER — LORAZEPAM 2 MG/ML IJ SOLN
2.0000 mg | Freq: Once | INTRAMUSCULAR | Status: AC
  Administered 2022-07-16: 2 mg via INTRAVENOUS

## 2022-07-16 MED ORDER — LORAZEPAM 2 MG/ML IJ SOLN
2.0000 mg | Freq: Once | INTRAMUSCULAR | Status: AC
  Administered 2022-07-16: 2 mg via INTRAVENOUS
  Filled 2022-07-16: qty 1

## 2022-07-16 NOTE — Assessment & Plan Note (Signed)
-   We will continue her Lopressor. 

## 2022-07-16 NOTE — Assessment & Plan Note (Signed)
-   The patient will be placed on supplement coverage with NovoLog with frequent fingerstick blood glucose measures. - We will continue her basal Semglee coverage. - She will be hydrated with IV normal saline as mentioned above.

## 2022-07-16 NOTE — Inpatient Diabetes Management (Signed)
Inpatient Diabetes Program Recommendations  AACE/ADA: New Consensus Statement on Inpatient Glycemic Control (2015)  Target Ranges:  Prepandial:   less than 140 mg/dL      Peak postprandial:   less than 180 mg/dL (1-2 hours)      Critically ill patients:  140 - 180 mg/dL    Latest Reference Range & Units 07/16/22 00:02  Glucose 70 - 99 mg/dL 956 (H)  (H): Data is abnormally high    Admit with: Seizures/ Sepsis due to UTI  History: DM2, Lupus  Home DM Meds: Novolog 2 units TID with meals       Semglee 10 units Daily  Current Orders: Semglee 10 units Daily    MD- Please consider placing orders for Novolog Sensitive Correction Scale/ SSI (0-9 units) TID AC + HS     --Will follow patient during hospitalization--  Ambrose [place] RN, MSN, CDCES Diabetes Coordinator Inpatient Glycemic Control Team Team Pager: [phone] (8a-5p)

## 2022-07-16 NOTE — H&P (Signed)
[place]   PATIENT NAME: Rachael Gray    MR#:  [id number]  DATE OF BIRTH:  November 17, 1980  DATE OF ADMISSION:  07/15/2022  PRIMARY CARE PHYSICIAN: [facility]   Patient is coming from: Home  REQUESTING/REFERRING PHYSICIAN:, Chiquita Loth, MD  CHIEF COMPLAINT:   Chief Complaint  Patient presents with   Seizures    HISTORY OF PRESENT ILLNESS:  Rachael Gray is a 42 y.o. African-American female with medical history significant for type 2 diabetes mellitus, essential hypertension, gastroparesis, lupus, seizures and pseudoseizures, presented to the emergency room with acute onset of suspected seizure that lasted about 10 minutes per EMS.  The patient was given a total of 7.5 mg of IV Versed.  She had seizure-like activity in the ER for which she was given 750 mg of IV Keppra and 2 mg of IV Ativan.  It was thought to be seizure-like activity in the ER as the patient was awake and talking during this episode.  She admitted to urinary frequency and urgency with dysuria since yesterday.  She has been having low back pain but denies any flank pain or hematuria.  No cough or wheezing or dyspnea.  No fever or chills.  No nausea or vomiting or abdominal pain.  No paresthesias or focal muscle weakness.  ED Course: When she came to the ER, BP was 150/97 with a heart rate of 105 and otherwise normal vital signs.  Later on respiratory rate was 24.  Labs revealed hyperglycemia of 346 and hypochloremia of 95 with otherwise unremarkable CMP.  VBG showed bicarbonate of 31.1 and pH 7.41.  CBC showed leukocytosis 12.7.  Tylenol level was less than 10 salicylate less than 7.  Beta-hydroxybutyrate was elevated at 0.89 and urine pregnancy test was negative.  UA came back with 11-20 WBCs and 100 protein with small leukocytes and 20 ketones and more than 500 glucose with no bacteria. EKG as reviewed by me : Sinus tachycardia with a rate of 105 with poor R wave progression and Q waves  inferiorly. Imaging: Portable chest x-ray showed no acute cardiopulmonary disease. Noncontrasted head CT scan showed no acute intracranial normalities.  The patient was given 750 mg of IV IV Keppra, 2 mg of IV Ativan and 1 L bolus of IV normal saline as well as 1 g of IV Rocephin.  She will be admitted to a medical telemetry bed for further evaluation and management. PAST MEDICAL HISTORY:   Past Medical History:  Diagnosis Date   Collagen vascular disease    Diabetes mellitus without complication    Gastroparesis    Lupus    Pseudoseizures    Seizures     PAST SURGICAL HISTORY:   Past Surgical History:  Procedure Laterality Date   APPENDECTOMY     CHOLECYSTECTOMY     ESOPHAGOGASTRODUODENOSCOPY N/A 06/28/2020   Procedure: ESOPHAGOGASTRODUODENOSCOPY (EGD);  Surgeon: Pasty Spillers, MD;  Location: [facility] ENDOSCOPY;  Service: Endoscopy;  Laterality: N/A;    SOCIAL HISTORY:   Social History   Tobacco Use   Smoking status: Every Day    Packs/day: 0.50    Years: 10.00    Additional pack years: 0.00    Total pack years: 5.00    Types: Cigarettes   Smokeless tobacco: Never  Substance Use Topics   Alcohol use: No    FAMILY HISTORY:   Family History  Problem Relation Age of Onset   Breast cancer Maternal Grandmother     DRUG ALLERGIES:  Allergies  Allergen Reactions   Dilantin [Phenytoin Sodium Extended] Hives   Doxycycline Swelling   Other    Tomato Rash   Tylenol [Acetaminophen] Rash    REVIEW OF SYSTEMS:   ROS As per history of present illness. All pertinent systems were reviewed above. Constitutional, HEENT, cardiovascular, respiratory, GI, GU, musculoskeletal, neuro, psychiatric, endocrine, integumentary and hematologic systems were reviewed and are otherwise negative/unremarkable except for positive findings mentioned above in the HPI.   MEDICATIONS AT HOME:   Prior to Admission medications   Medication Sig Start Date End Date Taking? Authorizing  Provider  insulin aspart (NOVOLOG) 100 UNIT/ML injection Inject 2 Units into the skin 3 (three) times daily with meals. 06/06/22  Yes Agbata, Tochukwu, MD  insulin glargine-yfgn (SEMGLEE) 100 UNIT/ML injection Inject 0.1 mLs (10 Units total) into the skin daily. 06/07/22  Yes Agbata, Tochukwu, MD  Insulin Pen Needle 29G X 12.7MM MISC 10 Units by Does not apply route daily. 06/06/22  Yes Agbata, Tochukwu, MD  levETIRAcetam (KEPPRA) 750 MG tablet Take 1 tablet (750 mg total) by mouth 2 (two) times daily. 06/06/22  Yes Agbata, Tochukwu, MD  metoprolol tartrate (LOPRESSOR) 25 MG tablet Take 0.5 tablets (12.5 mg total) by mouth 2 (two) times daily. 06/06/22  Yes Agbata, Tochukwu, MD  metoCLOPramide (REGLAN) 5 MG tablet Take 1 tablet (5 mg total) by mouth 4 (four) times daily -  before meals and at bedtime for 5 days. 06/06/22 06/11/22  Agbata, Elwyn Lade, MD      VITAL SIGNS:  Blood pressure (!) 143/91, pulse 100, temperature 98 F (36.7 C), resp. rate (!) 24, height  (1.651 m), weight 72.6 kg, SpO2 100 %.  PHYSICAL EXAMINATION:  Physical Exam  GENERAL:  42 y.o.-year-old African-American female patient lying in the bed with no acute distress.  EYES: Pupils equal, round, reactive to light and accommodation. No scleral icterus. Extraocular muscles intact.  HEENT: Head atraumatic, normocephalic. Oropharynx and nasopharynx clear.  NECK:  Supple, no jugular venous distention. No thyroid enlargement, no tenderness.  LUNGS: Normal breath sounds bilaterally, no wheezing, rales,rhonchi or crepitation. No use of accessory muscles of respiration.  CARDIOVASCULAR: Regular rate and rhythm, S1, S2 normal. No murmurs, rubs, or gallops.  ABDOMEN: Soft, nondistended, nontender. Bowel sounds present. No organomegaly or mass.  EXTREMITIES: No pedal edema, cyanosis, or clubbing.  NEUROLOGIC: Cranial nerves II through XII are intact. Muscle strength 5/5 in all extremities. Sensation intact. Gait not checked.  The patient  was talking and responding to questions while having so the seizure-like activity with whole body shaking. PSYCHIATRIC: The patient is alert and oriented x 3.  Normal affect and good eye contact. SKIN: No obvious rash, lesion, or ulcer.   LABORATORY PANEL:   CBC Recent Labs  Lab 07/16/22 0002  WBC 12.7*  HGB 14.1  HCT 42.9  PLT 253   ------------------------------------------------------------------------------------------------------------------  Chemistries  Recent Labs  Lab 07/16/22 0002  NA 135  K 3.6  CL 95*  CO2 25  GLUCOSE 346*  BUN 15  CREATININE 0.78  CALCIUM 9.3  MG 1.8  AST 17  ALT 10  ALKPHOS 51  BILITOT 0.9   ------------------------------------------------------------------------------------------------------------------  Cardiac Enzymes No results for input(s): "TROPONINI" in the last 168 hours. ------------------------------------------------------------------------------------------------------------------  RADIOLOGY:  CT Head Wo Contrast  Result Date: 07/16/2022 CLINICAL DATA:  Mental status change, unknown cause, status epilepticus EXAM: CT HEAD WITHOUT CONTRAST TECHNIQUE: Contiguous axial images were obtained from the base of the skull through the vertex without intravenous contrast.  RADIATION DOSE REDUCTION: This exam was performed according to the departmental dose-optimization program which includes automated exposure control, adjustment of the mA and/or kV according to patient size and/or use of iterative reconstruction technique. COMPARISON:  None Available. FINDINGS: Brain: Normal anatomic configuration. No abnormal intra or extra-axial mass lesion or fluid collection. No abnormal mass effect or midline shift. No evidence of acute intracranial hemorrhage or infarct. Ventricular size is normal. Cerebellum unremarkable. Vascular: Unremarkable Skull: Intact Sinuses/Orbits: Paranasal sinuses are clear. Orbits are unremarkable. Other: Mastoid air cells  and middle ear cavities are clear. IMPRESSION: 1. No acute intracranial abnormality. Electronically Signed   By: Helyn Numbers M.D.   On: 07/16/2022 02:08   DG Chest Port 1 View  Result Date: 07/16/2022 CLINICAL DATA:  Seizure EXAM: PORTABLE CHEST 1 VIEW COMPARISON:  10/07/2021 FINDINGS: The heart size and mediastinal contours are within normal limits. Both lungs are clear. The visualized skeletal structures are unremarkable. IMPRESSION: No active disease. Electronically Signed   By: Helyn Numbers M.D.   On: 07/16/2022 00:23      IMPRESSION AND PLAN:  Assessment and Plan: * Sepsis due to gram-negative UTI - The patient will be admitted to a medical telemetry bed. - Sepsis manifested by tachycardia, tachypnea and leukocytosis of 12.7. - We will continue hydration with IV normal saline. - We will continue antibiotic therapy with IV Rocephin. - We will follow blood and urine cultures.   Seizure disorder - The patient had seizure-like activity in the ED witnessed by myself and the ED physician. - She will be placed on seizure precautions. - We will continue Keppra and placed on as needed IV Ativan. - EEG will be obtained. - Neurology consult will be obtained. - I notified Dr. Selina Cooley about the patient.  Uncontrolled type 2 diabetes mellitus with hyperglycemia, with long-term current use of insulin - The patient will be placed on supplement coverage with NovoLog with frequent fingerstick blood glucose measures. - We will continue her basal Semglee coverage. - She will be hydrated with IV normal saline as mentioned above.  Essential hypertension - We will continue her Lopressor.   DVT prophylaxis: Lovenox.  Advanced Care Planning:  Code Status: full code.  Family Communication:  The plan of care was discussed in details with the patient (and family). I answered all questions. The patient agreed to proceed with the above mentioned plan. Further management will depend upon hospital  course. Disposition Plan: Back to previous home environment Consults called: Neurology. All the records are reviewed and case discussed with ED provider.  Status is: Inpatient  At the time of the admission, it appears that the appropriate admission status for this patient is inpatient.  This is judged to be reasonable and necessary in order to provide the required intensity of service to ensure the patient's safety given the presenting symptoms, physical exam findings and initial radiographic and laboratory data in the context of comorbid conditions.  The patient requires inpatient status due to high intensity of service, high risk of further deterioration and high frequency of surveillance required.  I certify that at the time of admission, it is my clinical judgment that the patient will require inpatient hospital care extending more than 2 midnights.                            Dispo: The patient is from: Home              Anticipated d/c  is to: Home              Patient currently is not medically stable to d/c.              Difficult to place patient: No  Hannah Beat M.D on 07/16/2022 at 4:34 AM  [facility]   From 7 PM-7 AM, contact night-coverage [email or web address]  CC: Primary care physician; [facility]

## 2022-07-16 NOTE — ED Provider Notes (Signed)
[facility] Provider Note    Event Date/Time   First MD Initiated Contact with Patient 07/15/22 2358     (approximate)   History   Seizures   HPI  Level V caveat: Limited by postictal state  Rachael Gray is a 42 y.o. female brought to the ED via EMS from home with a chief complaint of seizures.  Patient with a history of seizure disorder, on Keppra.  Also with a history of diabetic gastroparesis as well as pseudoseizures and cannabinoid hyperemesis syndrome.  Family states patient had been feeling sick today and skipped her medications.  Reportedly had tonic-clonic seizure witnessed by family and EMS.  She was given 5 mg IM Versed followed by 2.5 mg IV Versed and 4 mg IV Zofran en route.  No seizure activity on arrival.  Patient is groggy but able to follow simple commands.  Rest of history is limited secondary to postictal state.     Past Medical History   Past Medical History:  Diagnosis Date   Collagen vascular disease    Diabetes mellitus without complication    Gastroparesis    Lupus    Pseudoseizures    Seizures      Active Problem List   Patient Active Problem List   Diagnosis Date Noted   Cannabinoid hyperemesis syndrome 06/03/2022   Leukocytosis 06/03/2022   Hypomagnesemia 06/03/2022   Hypoglycemia 08/26/2020   GERD (gastroesophageal reflux disease) 08/26/2020   Hypothermia 08/26/2020   Abdominal pain 08/26/2020   Seizures    Lupus    Poorly controlled type 2 diabetes mellitus with gastroparesis    Abnormal CT scan, esophagus    Acute esophagitis    Gastric erythema    DKA (diabetic ketoacidosis) 01/25/2020   Tobacco abuse disorder 01/25/2020   SIRS due to non-infectious process without acute organ dysfunction 01/25/2020   DKA, type 2 01/25/2020   Cannabis use disorder, moderate, dependence 12/01/2016   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   Hypokalemia 12/24/2015   Anemia 12/24/2015   Diabetes 12/24/2015    Gastroparesis due to DM    Acute respiratory alkalosis 12/22/2015   Duodenitis 12/22/2015   Vomiting 12/16/2015   Nausea & vomiting 12/10/2014   Narcotic abuse 12/09/2014   Sepsis 12/07/2014   Anxiety    Pyelonephritis 12/04/2014   Lactic acidosis 12/04/2014    Class: Acute     Past Surgical History   Past Surgical History:  Procedure Laterality Date   APPENDECTOMY     CHOLECYSTECTOMY     ESOPHAGOGASTRODUODENOSCOPY N/A 06/28/2020   Procedure: ESOPHAGOGASTRODUODENOSCOPY (EGD);  Surgeon: Pasty Spillers, MD;  Location: [facility] ENDOSCOPY;  Service: Endoscopy;  Laterality: N/A;     Home Medications   Prior to Admission medications   Medication Sig Start Date End Date Taking? Authorizing Provider  insulin aspart (NOVOLOG) 100 UNIT/ML injection Inject 2 Units into the skin 3 (three) times daily with meals. 06/06/22   Agbata, Tochukwu, MD  insulin glargine-yfgn (SEMGLEE) 100 UNIT/ML injection Inject 0.1 mLs (10 Units total) into the skin daily. 06/07/22   Agbata, Elwyn Lade, MD  Insulin Pen Needle 29G X 12.7MM MISC 10 Units by Does not apply route daily. 06/06/22   Lucile Shutters, MD  levETIRAcetam (KEPPRA) 750 MG tablet Take 1 tablet (750 mg total) by mouth 2 (two) times daily. 06/06/22   Lucile Shutters, MD  metoCLOPramide (REGLAN) 5 MG tablet Take 1 tablet (5 mg total) by mouth 4 (four) times daily -  before meals and at bedtime  for 5 days. 06/06/22 06/11/22  Lucile Shutters, MD  metoprolol tartrate (LOPRESSOR) 25 MG tablet Take 0.5 tablets (12.5 mg total) by mouth 2 (two) times daily. 06/06/22   Lucile Shutters, MD     Allergies  Dilantin [phenytoin sodium extended], Doxycycline, Other, Tomato, and Tylenol [acetaminophen]   Family History   Family History  Problem Relation Age of Onset   Breast cancer Maternal Grandmother      Physical Exam  Triage Vital Signs: ED Triage Vitals  Enc Vitals Group     BP      Pulse      Resp      Temp      Temp src      SpO2       Weight      Height      Head Circumference      Peak Flow      Pain Score      Pain Loc      Pain Edu?      Excl. in GC?     Updated Vital Signs: BP (!) 143/91   Pulse 100   Temp 98 F (36.7 C) (Axillary)   Resp (!) 24   Ht 5\' 5"  (1.651 m)   Wt 72.6 kg   SpO2 100%   BMI 26.63 kg/m    General: Drowsy, mild to moderate distress.  CV:  Mildly tachycardic.  Good peripheral perfusion.  Resp:  Normal effort.  CTAB. Abd:  Nontender.  No distention.  Emesis noted on close. Other:  Postictal, drowsy but able to follow simple commands.  MAE x 4.  No petechiae.   ED Results / Procedures / Treatments  Labs (all labs ordered are listed, but only abnormal results are displayed) Labs Reviewed  CBC - Abnormal; Notable for the following components:      Result Value   WBC 12.7 (*)    All other components within normal limits  COMPREHENSIVE METABOLIC PANEL - Abnormal; Notable for the following components:   Chloride 95 (*)    Glucose, Bld 346 (*)    All other components within normal limits  ACETAMINOPHEN LEVEL - Abnormal; Notable for the following components:   Acetaminophen (Tylenol), Serum <10 (*)    All other components within normal limits  SALICYLATE LEVEL - Abnormal; Notable for the following components:   Salicylate Lvl <7.0 (*)    All other components within normal limits  URINALYSIS, ROUTINE W REFLEX MICROSCOPIC - Abnormal; Notable for the following components:   Color, Urine YELLOW (*)    APPearance CLEAR (*)    Specific Gravity, Urine 1.032 (*)    Glucose, UA >=500 (*)    Hgb urine dipstick SMALL (*)    Ketones, ur 20 (*)    Protein, ur 100 (*)    Leukocytes,Ua SMALL (*)    All other components within normal limits  URINE DRUG SCREEN, QUALITATIVE ([facility] ONLY) - Abnormal; Notable for the following components:   Cannabinoid 50 Ng, Ur [place] POSITIVE (*)    Benzodiazepine, Ur Scrn POSITIVE (*)    All other components within normal limits  BETA-HYDROXYBUTYRIC ACID -  Abnormal; Notable for the following components:   Beta-Hydroxybutyric Acid 0.89 (*)    All other components within normal limits  BLOOD GAS, VENOUS - Abnormal; Notable for the following components:   Bicarbonate 31.1 (*)    Acid-Base Excess 5.3 (*)    All other components within normal limits  POC URINE PREG, ED - Normal  ETHANOL  MAGNESIUM  LEVETIRACETAM LEVEL     EKG  ED ECG REPORT I, Nathan Moctezuma J, the attending physician, personally viewed and interpreted this ECG.   Date: 07/16/2022  EKG Time: 2359  Rate: 105  Rhythm: sinus tachycardia  Axis: Normal  Intervals:none  ST&T Change: Nonspecific    RADIOLOGY I have independently visualized and interpreted patient's x-ray and CT scan as well as noted the radiology interpretation:  X-ray: No acute cardiopulmonary process  CT head: No ICH  Official radiology report(s): CT Head Wo Contrast  Result Date: 07/16/2022 CLINICAL DATA:  Mental status change, unknown cause, status epilepticus EXAM: CT HEAD WITHOUT CONTRAST TECHNIQUE: Contiguous axial images were obtained from the base of the skull through the vertex without intravenous contrast. RADIATION DOSE REDUCTION: This exam was performed according to the departmental dose-optimization program which includes automated exposure control, adjustment of the mA and/or kV according to patient size and/or use of iterative reconstruction technique. COMPARISON:  None Available. FINDINGS: Brain: Normal anatomic configuration. No abnormal intra or extra-axial mass lesion or fluid collection. No abnormal mass effect or midline shift. No evidence of acute intracranial hemorrhage or infarct. Ventricular size is normal. Cerebellum unremarkable. Vascular: Unremarkable Skull: Intact Sinuses/Orbits: Paranasal sinuses are clear. Orbits are unremarkable. Other: Mastoid air cells and middle ear cavities are clear. IMPRESSION: 1. No acute intracranial abnormality. Electronically Signed   By: Helyn Numbers  M.D.   On: 07/16/2022 02:08   DG Chest Port 1 View  Result Date: 07/16/2022 CLINICAL DATA:  Seizure EXAM: PORTABLE CHEST 1 VIEW COMPARISON:  10/07/2021 FINDINGS: The heart size and mediastinal contours are within normal limits. Both lungs are clear. The visualized skeletal structures are unremarkable. IMPRESSION: No active disease. Electronically Signed   By: Helyn Numbers M.D.   On: 07/16/2022 00:23     PROCEDURES:  Critical Care performed: Yes, see critical care procedure note(s)  CRITICAL CARE Performed by: Irean Hong   Total critical care time: 30 minutes  Critical care time was exclusive of separately billable procedures and treating other patients.  Critical care was necessary to treat or prevent imminent or life-threatening deterioration.  Critical care was time spent personally by me on the following activities: development of treatment plan with patient and/or surrogate as well as nursing, discussions with consultants, evaluation of patient's response to treatment, examination of patient, obtaining history from patient or surrogate, ordering and performing treatments and interventions, ordering and review of laboratory studies, ordering and review of radiographic studies, pulse oximetry and re-evaluation of patient's condition.   Marland Kitchen1-3 Lead EKG Interpretation  Performed by: Irean Hong, MD Authorized by: Irean Hong, MD     Interpretation: abnormal     ECG rate:  105   ECG rate assessment: tachycardic     Rhythm: sinus tachycardia     Ectopy: none     Conduction: normal   Comments:     Patient placed on cardiac monitor to evaluate for arrhythmias    MEDICATIONS ORDERED IN ED: Medications  cefTRIAXone (ROCEPHIN) 1 g in sodium chloride 0.9 % 100 mL IVPB (has no administration in time range)  levETIRAcetam (KEPPRA) 750 mg in sodium chloride 0.9 % 100 mL IVPB (0 mg Intravenous Stopped 07/16/22 0103)  sodium chloride 0.9 % bolus 1,000 mL (1,000 mLs Intravenous New  Bag/Given 07/16/22 0034)  LORazepam (ATIVAN) injection 2 mg (2 mg Intravenous Given 07/16/22 0033)     IMPRESSION / MDM / ASSESSMENT AND PLAN / ED COURSE  I reviewed the triage  vital signs and the nursing notes.                             42 year old female presenting with seizures. Differential diagnosis includes, but is not limited to, alcohol, illicit or prescription medications, or other toxic ingestion; intracranial pathology such as stroke or intracerebral hemorrhage; fever or infectious causes including sepsis; hypoxemia and/or hypercarbia; uremia; trauma; endocrine related disorders such as diabetes, hypoglycemia, and thyroid-related diseases; hypertensive encephalopathy; etc. personally reviewed patient's records and note hospitalization 3/11 to 06/06/2022 for DKA, seizures.  It is noted in the discharge summary that patient admitted to medical noncompliance with her Keppra.  Patient's presentation is most consistent with acute presentation with potential threat to life or bodily function.  The patient is on the cardiac monitor to evaluate for evidence of arrhythmia and/or significant heart rate changes.  Obtain lab work, imaging studies.  Seizure pads in place.  Initiate IV fluid resuscitation, IV Keppra.  Will reassess.  Clinical Course as of 07/16/22 0259  Tue Jul 16, 2022  0025 Patient with her arms up over her head, rhythmical shaking.  Eyes open.  Able to answer questions.  States she is compliant with her Keppra.  States she feels shaky and that Ativan usually helps. [JS]  0235 Laboratory results demonstrate mild leukocytosis, hyperglycemia without elevation of anion gap, leukocyte positive UTI.  VBG unremarkable.  Will administer IV Rocephin.  CT head and chest x-ray are clear. [JS]  0258 Continues with nonepileptic seizures.  She will stop when asked to stop.  Remains altered.  Given hyperglycemia, possible true epileptic seizure per EMS, UTI, will consult hospitalist services  for valuation and admission. [JS]    Clinical Course User Index [JS] Irean Hong, MD     FINAL CLINICAL IMPRESSION(S) / ED DIAGNOSES   Final diagnoses:  Seizures  Psychogenic nonepileptic seizure  Lower urinary tract infectious disease  Hyperglycemia  Marijuana use     Rx / DC Orders   ED Discharge Orders     None        Note:  This document was prepared using Dragon voice recognition software and may include unintentional dictation errors.   Irean Hong, MD 07/16/22 [phone]

## 2022-07-16 NOTE — Assessment & Plan Note (Signed)
>>  ASSESSMENT AND PLAN FOR SEIZURE DISORDER (HCC) WRITTEN ON 07/16/2022  4:34 AM BY MANSY, JAN A, MD  - The patient had seizure-like activity in the ED witnessed by myself and the ED physician. - She will be placed on seizure precautions. - We will continue Keppra  and placed on as needed IV Ativan . - EEG will be obtained. - Neurology consult will be obtained. - I notified Dr. Doretta Gant about the patient.

## 2022-07-16 NOTE — Discharge Summary (Signed)
Physician Discharge Summary  Rachael Gray ZOX:[id number] DOB: [birth year] DOA: 07/15/2022  [facility]  Admit date: 07/15/2022 Discharge date: 07/16/2022  Admitted From: Home Disposition:  Home  Recommendations for Outpatient Follow-up:  Follow up with PCP in 1-2 weeks Establish care with [facility] Health:No  Equipment/Devices:None  Discharge Condition:Stable  CODE STATUS:FULL  Diet recommendation: Carb mod  Brief/Interim Summary:  42 y.o. African-American female with medical history significant for type 2 diabetes mellitus, essential hypertension, gastroparesis, lupus, seizures and pseudoseizures, presented to the emergency room with acute onset of suspected seizure that lasted about 10 minutes per EMS.  The patient was given a total of 7.5 mg of IV Versed.  She had seizure-like activity in the ER for which she was given 750 mg of IV Keppra and 2 mg of IV Ativan.  It was thought to be seizure-like activity in the ER as the patient was awake and talking during this episode.  She admitted to urinary frequency and urgency with dysuria since yesterday.  She has been having low back pain but denies any flank pain or hematuria.  No cough or wheezing or dyspnea.  No fever or chills.  No nausea or vomiting or abdominal pain.  No paresthesias or focal muscle weakness.   4/23.  Seen in consultation by neurology.  EEG ordered and reviewed.  Negative for epileptic activity.  Patient's presentation is inconsistent with true epilepsy.  Keppra is being stopped.  Lengthy discussion with patient and daughter at time of discharge.  Discussed that these psychogenic nonepileptic episodes may be psychiatric in nature.  Recommend follow-up with outpatient psychiatry.  Instructions given to follow-up at [facility], walk-in behavioral health clinic    Discharge Diagnoses:  Principal Problem:   Sepsis due to gram-negative UTI Active Problems:   Seizure disorder   Uncontrolled type 2  diabetes mellitus with hyperglycemia, with long-term current use of insulin   Essential hypertension  Psychogenic nonepileptic seizures Unclear nature of episodes.  EEG negative.  Discussed case with neurology.  Keppra being stopped.  Follow-up outpatient with behavioral health.  Instructions given for follow-up at [facility].  Sepsis due to gram-negative UTI, ruled out Presentation inconsistent with infection.  Procalcitonin negative.  Urinalysis inconsistent with infection.  Intravenous antibiotics stopped.  No antibiotics indicated on discharge.  Discharge Instructions  Discharge Instructions     Ambulatory referral to Psychiatry   Complete by: As directed    Diet - low sodium heart healthy   Complete by: As directed    Increase activity slowly   Complete by: As directed       Allergies as of 07/16/2022       Reactions   Dilantin [phenytoin Sodium Extended] Hives   Doxycycline Swelling   Other    Tomato Rash   Tylenol [acetaminophen] Rash        Medication List     STOP taking these medications    levETIRAcetam 750 MG tablet Commonly known as: KEPPRA       TAKE these medications    insulin aspart 100 UNIT/ML injection Commonly known as: novoLOG Inject 2 Units into the skin 3 (three) times daily with meals.   insulin glargine-yfgn 100 UNIT/ML injection Commonly known as: SEMGLEE Inject 0.1 mLs (10 Units total) into the skin daily.   Insulin Pen Needle 29G X 12.7MM Misc 10 Units by Does not apply route daily.   metoCLOPramide 5 MG tablet Commonly known as: Reglan Take 1 tablet (5 mg total) by  mouth 4 (four) times daily -  before meals and at bedtime for 5 days.   metoprolol tartrate 25 MG tablet Commonly known as: LOPRESSOR Take 0.5 tablets (12.5 mg total) by mouth 2 (two) times daily.        Allergies  Allergen Reactions   Dilantin [Phenytoin Sodium Extended] Hives   Doxycycline Swelling   Other    Tomato Rash   Tylenol [Acetaminophen]  Rash    Consultations: Neurology   Procedures/Studies: EEG adult  Result Date: 07/23/2022 Jefferson Fuel, MD     07-23-2022  1:46 PM Routine EEG Report Rachael Gray 42 y.o. female with a history of altered mental status who is undergoing an EEG to evaluate for seizures. Report: This EEG was acquired with electrodes placed according to the International 10-20 electrode system (including Fp1, Fp2, F3, F4, C3, C4, P3, P4, O1, O2, T3, T4, T5, T6, A1, A2, Fz, Cz, Pz). The following electrodes were missing or displaced: none. The occipital dominant rhythm was 10 Hz. This activity is reactive to stimulation. Drowsiness was manifested by background fragmentation; deeper stages of sleep were identified by K complexes and sleep spindles. There was no focal slowing. There were no interictal epileptiform discharges. There were no electrographic seizures identified. Photic stimulation and hyperventilation were not performed. Impression: This EEG was obtained while awake and asleep and is normal.   Clinical Correlation: Normal EEGs, however, do not rule out epilepsy. Bing Neighbors, MD [facility] [phone] If 7pm- 7am, please page neurology on call as listed in AMION.   CT Head Wo Contrast  Result Date: 07/23/2022 CLINICAL DATA:  Mental status change, unknown cause, status epilepticus EXAM: CT HEAD WITHOUT CONTRAST TECHNIQUE: Contiguous axial images were obtained from the base of the skull through the vertex without intravenous contrast. RADIATION DOSE REDUCTION: This exam was performed according to the departmental dose-optimization program which includes automated exposure control, adjustment of the mA and/or kV according to patient size and/or use of iterative reconstruction technique. COMPARISON:  None Available. FINDINGS: Brain: Normal anatomic configuration. No abnormal intra or extra-axial mass lesion or fluid collection. No abnormal mass effect or midline shift. No evidence of  acute intracranial hemorrhage or infarct. Ventricular size is normal. Cerebellum unremarkable. Vascular: Unremarkable Skull: Intact Sinuses/Orbits: Paranasal sinuses are clear. Orbits are unremarkable. Other: Mastoid air cells and middle ear cavities are clear. IMPRESSION: 1. No acute intracranial abnormality. Electronically Signed   By: Helyn Numbers M.D.   On: 07/23/22 02:08   DG Chest Port 1 View  Result Date: 2022-07-23 CLINICAL DATA:  Seizure EXAM: PORTABLE CHEST 1 VIEW COMPARISON:  10/07/2021 FINDINGS: The heart size and mediastinal contours are within normal limits. Both lungs are clear. The visualized skeletal structures are unremarkable. IMPRESSION: No active disease. Electronically Signed   By: Helyn Numbers M.D.   On: 2022-07-23 00:23      Subjective: Seen and examined on the day of discharge.  Tearful, rocking back and forth but communicating and mentating clearly.  Stable for discharge home.  Discharge Exam: Vitals:   07/23/22 0854 2022/07/23 1250  BP: (!) 143/80   Pulse: (!) 118 86  Resp: 20   Temp: 98.3 F (36.8 C)   SpO2: 99%    Vitals:   23-Jul-2022 0411 July 23, 2022 0539 Jul 23, 2022 0854 07-23-2022 1250  BP:  (!) 114/96 (!) 143/80   Pulse:  (!) 121 (!) 118 86  Resp:  20 20   Temp: 98 F (36.7 C) 98.6 F (37 C) 98.3  F (36.8 C)   TempSrc:      SpO2:  100% 99%   Weight:      Height:        General: Patient awake, alert.  Rocking back and forth.  Tearful. Cardiovascular: RRR, S1/S2 +, no rubs, no gallops Respiratory: CTA bilaterally, no wheezing, no rhonchi Abdominal: Soft, NT, ND, bowel sounds + Extremities: no edema, no cyanosis    The results of significant diagnostics from this hospitalization (including imaging, microbiology, ancillary and laboratory) are listed below for reference.     Microbiology: No results found for this or any previous visit (from the past 240 hour(s)).   Labs: BNP (last 3 results) No results for input(s): "BNP" in the last 8760  hours. Basic Metabolic Panel: Recent Labs  Lab 07/16/22 0002 07/16/22 0852  NA 135 135  K 3.6 4.1  CL 95* 97*  CO2 25 20*  GLUCOSE 346* 298*  BUN 15 15  CREATININE 0.78 0.64  CALCIUM 9.3 9.3  MG 1.8  --    Liver Function Tests: Recent Labs  Lab 07/16/22 0002  AST 17  ALT 10  ALKPHOS 51  BILITOT 0.9  PROT 7.8  ALBUMIN 4.1   No results for input(s): "LIPASE", "AMYLASE" in the last 168 hours. No results for input(s): "AMMONIA" in the last 168 hours. CBC: Recent Labs  Lab 07/16/22 0002 07/16/22 0852  WBC 12.7* 12.3*  HGB 14.1 13.8  HCT 42.9 42.2  MCV 86.7 86.7  PLT 253 293   Cardiac Enzymes: No results for input(s): "CKTOTAL", "CKMB", "CKMBINDEX", "TROPONINI" in the last 168 hours. BNP: Invalid input(s): "POCBNP" CBG: Recent Labs  Lab 07/16/22 0900 07/16/22 1153  GLUCAP 287* 274*   D-Dimer No results for input(s): "DDIMER" in the last 72 hours. Hgb A1c No results for input(s): "HGBA1C" in the last 72 hours. Lipid Profile No results for input(s): "CHOL", "HDL", "LDLCALC", "TRIG", "CHOLHDL", "LDLDIRECT" in the last 72 hours. Thyroid function studies No results for input(s): "TSH", "T4TOTAL", "T3FREE", "THYROIDAB" in the last 72 hours.  Invalid input(s): "FREET3" Anemia work up No results for input(s): "VITAMINB12", "FOLATE", "FERRITIN", "TIBC", "IRON", "RETICCTPCT" in the last 72 hours. Urinalysis    Component Value Date/Time   COLORURINE YELLOW (A) 07/16/2022 0002   APPEARANCEUR CLEAR (A) 07/16/2022 0002   APPEARANCEUR HAZY 06/15/2014 2100   LABSPEC 1.032 (H) 07/16/2022 0002   LABSPEC 1.018 06/15/2014 2100   PHURINE 6.0 07/16/2022 0002   GLUCOSEU >=500 (A) 07/16/2022 0002   GLUCOSEU >=500 mg/dL 16/12/9602 5409   HGBUR SMALL (A) 07/16/2022 0002   BILIRUBINUR NEGATIVE 07/16/2022 0002   BILIRUBINUR NEGATIVE 06/15/2014 2100   KETONESUR 20 (A) 07/16/2022 0002   PROTEINUR 100 (A) 07/16/2022 0002   UROBILINOGEN 1.0 02/09/2008 1519   NITRITE NEGATIVE  07/16/2022 0002   LEUKOCYTESUR SMALL (A) 07/16/2022 0002   LEUKOCYTESUR TRACE 06/15/2014 2100   Sepsis Labs Recent Labs  Lab 07/16/22 0002 07/16/22 0852  WBC 12.7* 12.3*   Microbiology No results found for this or any previous visit (from the past 240 hour(s)).   Time coordinating discharge: Over 30 minutes  SIGNED:   Tresa Moore, MD  [facility] 07/16/2022, 3:18 PM Pager   If 7PM-7AM, please contact night-coverage

## 2022-07-16 NOTE — Progress Notes (Signed)
Eeg done 

## 2022-07-16 NOTE — Procedures (Signed)
Routine EEG Report  Rachael Gray is a 42 y.o. female with a history of altered mental status who is undergoing an EEG to evaluate for seizures.  Report: This EEG was acquired with electrodes placed according to the International 10-20 electrode system (including Fp1, Fp2, F3, F4, C3, C4, P3, P4, O1, O2, T3, T4, T5, T6, A1, A2, Fz, Cz, Pz). The following electrodes were missing or displaced: none.  The occipital dominant rhythm was 10 Hz. This activity is reactive to stimulation. Drowsiness was manifested by background fragmentation; deeper stages of sleep were identified by K complexes and sleep spindles. There was no focal slowing. There were no interictal epileptiform discharges. There were no electrographic seizures identified. Photic stimulation and hyperventilation were not performed.  Impression: This EEG was obtained while awake and asleep and is normal.    Clinical Correlation: Normal EEGs, however, do not rule out epilepsy.  Bing Neighbors, MD [facility] [phone]  If 7pm- 7am, please page neurology on call as listed in AMION.

## 2022-07-16 NOTE — Assessment & Plan Note (Signed)
-   The patient will be admitted to a medical telemetry bed. - Sepsis manifested by tachycardia, tachypnea and leukocytosis of 12.7. - We will continue hydration with IV normal saline. - We will continue antibiotic therapy with IV Rocephin. - We will follow blood and urine cultures.

## 2022-07-16 NOTE — ED Notes (Signed)
While in the room with pt, this nurse observed the pt removing the IV from her hand that was just placed and secured with extra measures to ensure it remained. This was the third IV placed on this pt, when asked why she removed, she states "I don't know". Pt is alert and oriented as she has been the duration of her visit, able to voice concerns/questions as well as make requests for water. The pt also refuses to stay in the bed, especially after being instructed to stay in bed, her response being "well there is no bed alarm". Bed alarm then began to go off, pt proceeds to slide towards the foot of the bed to get off. Pt able to stop "seizure-like activity" when asked to keep extremity straight.

## 2022-07-16 NOTE — Progress Notes (Signed)
PIV consult: Long 22g placed R forearm. Pt rocking back and forth in bed, was able to hold still for IV insertion, requesting something to drink. NT at bedside. RN aware.

## 2022-07-16 NOTE — Progress Notes (Signed)
Initial Nutrition Assessment  DOCUMENTATION CODES:   Not applicable  INTERVENTION:   -Glucerna Shake po TID, each supplement provides 220 kcal and 10 grams of protein  -MVI with minerals daily  NUTRITION DIAGNOSIS:   Increased nutrient needs related to chronic illness (lupus) as evidenced by estimated needs.  GOAL:   Patient will meet greater than or equal to 90% of their needs  MONITOR:   PO intake, Supplement acceptance  REASON FOR ASSESSMENT:   Malnutrition Screening Tool    ASSESSMENT:   Pt with medical history significant for type 2 diabetes mellitus, essential hypertension, gastroparesis, lupus, seizures and pseudoseizures, presented with acute onset of suspected seizure that lasted about 10 minutes per EMS.  Pt admitted with sepsis due to gram negative UTI and seizure disorder.   4/23- s/p EEG- normal  Reviewed I/O's: +108 ml x 24 hours  Case discussed with sitter at bedside. Pt just returned from EED; she reports pt has been non-cooperative with care.   Pt lying in bed at time of visit. She did not respond to touch. No family at bedside to provide additional history.   Pt would greatly benefit from addition of oral nutrition supplements secondary to gastroparesis.   Reviewed wt hx; no wt loss noted over the past 9 months.   Medications reviewed and include keppra, ativan, and 0.9% sodium chloride infusion @ 150 ml/hr.   Lab Results  Component Value Date   HGBA1C 10.3 (H) 06/03/2022   PTA DM medications are 2 units insulin aspart TID with meals and 10 units inuslin glargine-yfgn daily.   Labs reviewed: CBGS: 247-287 (inpatient orders for glycemic control are 0-5 units insulin aspart daily at bedtime, 0-9 units insulin aspart TID with meals and 10 units insulin glargine-yfgn daily).    NUTRITION - FOCUSED PHYSICAL EXAM:  Flowsheet Row Most Recent Value  Orbital Region No depletion  Upper Arm Region No depletion  Thoracic and Lumbar Region No depletion   Buccal Region No depletion  Temple Region No depletion  Clavicle Bone Region No depletion  Clavicle and Acromion Bone Region No depletion  Scapular Bone Region No depletion  Dorsal Hand No depletion  Patellar Region No depletion  Anterior Thigh Region No depletion  Posterior Calf Region No depletion  Edema (RD Assessment) Mild  Hair Reviewed  Eyes Reviewed  Mouth Reviewed  Skin Reviewed  Nails Reviewed       Diet Order:   Diet Order             Diet Carb Modified Fluid consistency: Thin; Room service appropriate? Yes  Diet effective now                   EDUCATION NEEDS:   Not appropriate for education at this time  Skin:  Skin Assessment: Reviewed RN Assessment  Last BM:  07/16/22 (type 5)  Height:   Ht Readings from Last 1 Encounters:  07/16/22  (1.651 m)    Weight:   Wt Readings from Last 1 Encounters:  07/16/22 72.6 kg    Ideal Body Weight:  56.8 kg  BMI:  Body mass index is 26.63 kg/m.  Estimated Nutritional Needs:   Kcal:  1800-2000  Protein:  90-105 grams  Fluid:  > 1.8 L    Levada Schilling, RD, LDN, CDCES Registered Dietitian II Certified Diabetes Care and Education Specialist Please refer to [facility] for RD and/or RD on-call/weekend/after hours pager

## 2022-07-16 NOTE — ED Notes (Signed)
Patient transported to CT 

## 2022-07-16 NOTE — Assessment & Plan Note (Addendum)
-   The patient had seizure-like activity in the ED witnessed by myself and the ED physician. - She will be placed on seizure precautions. - We will continue Keppra and placed on as needed IV Ativan. - EEG will be obtained. - Neurology consult will be obtained. - I notified Dr. Selina Cooley about the patient.

## 2022-07-16 NOTE — TOC Initial Note (Signed)
Transition of Care [facility]) - Initial/Assessment Note    Patient Details  Name: Rachael Gray MRN: [id number] Date of Birth: [birth year]  Transition of Care [facility]) CM/SW Contact:    Marlowe Sax, RN Phone Number: 07/16/2022, 10:16 AM  Clinical Narrative:                  Transition of Care (TOC) Screening Note   Patient Details  Name: Rachael Gray Date of Birth: Jan 29, 1981   Transition of Care [facility]) CM/SW Contact:    Marlowe Sax, RN Phone Number: 07/16/2022, 10:16 AM   Her grandmother provides transportation She has a PCP She has Medicaid    Transition of Care Department (TOC) has reviewed patient and no TOC needs have been identified at this time. We will continue to monitor patient advancement through interdisciplinary progression rounds. If new patient transition needs arise, please place a TOC consult.    Expected Discharge Plan: Home/Self Care Barriers to Discharge: Continued Medical Work up   Patient Goals and CMS Choice            Expected Discharge Plan and Services       Living arrangements for the past 2 months: Single Family Home                   DME Agency: NA       HH Arranged: NA HH Agency: NA        Prior Living Arrangements/Services Living arrangements for the past 2 months: Single Family Home Lives with:: Adult Children Patient language and need for interpreter reviewed:: Yes Do you feel safe going back to the place where you live?: Yes      Need for Family Participation in Patient Care: No (Comment) Care giver support system in place?: Yes (comment)   Criminal Activity/Legal Involvement Pertinent to Current Situation/Hospitalization: No - Comment as needed  Activities of Daily Living      Permission Sought/Granted   Permission granted to share information with : Yes, Verbal Permission Granted              Emotional Assessment Appearance:: Appears stated age Attitude/Demeanor/Rapport:  Engaged Affect (typically observed): Pleasant Orientation: : Oriented to Self, Oriented to Place, Oriented to  Time, Oriented to Situation Alcohol / Substance Use: Not Applicable Psych Involvement: No (comment)  Admission diagnosis:  Lower urinary tract infectious disease [N39.0] Seizures [R56.9] Hyperglycemia [R73.9] Marijuana use [F12.90] Psychogenic nonepileptic seizure [F44.5] Sepsis due to gram-negative UTI [A41.50, N39.0] Nausea and vomiting, unspecified vomiting type [R11.2] Patient Active Problem List   Diagnosis Date Noted   Sepsis due to gram-negative UTI 07/16/2022   Seizure disorder 07/16/2022   Uncontrolled type 2 diabetes mellitus with hyperglycemia, with long-term current use of insulin 07/16/2022   Essential hypertension 07/16/2022   Cannabinoid hyperemesis syndrome 06/03/2022   Leukocytosis 06/03/2022   Hypomagnesemia 06/03/2022   Hypoglycemia 08/26/2020   GERD (gastroesophageal reflux disease) 08/26/2020   Hypothermia 08/26/2020   Abdominal pain 08/26/2020   Seizures    Lupus    Poorly controlled type 2 diabetes mellitus with gastroparesis    Abnormal CT scan, esophagus    Acute esophagitis    Gastric erythema    DKA (diabetic ketoacidosis) 01/25/2020   Tobacco abuse disorder 01/25/2020   SIRS due to non-infectious process without acute organ dysfunction 01/25/2020   DKA, type 2 01/25/2020   Cannabis use disorder, moderate, dependence 12/01/2016   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   Hypokalemia 12/24/2015   Anemia  12/24/2015   Diabetes 12/24/2015   Gastroparesis due to DM    Acute respiratory alkalosis 12/22/2015   Duodenitis 12/22/2015   Vomiting 12/16/2015   Nausea & vomiting 12/10/2014   Narcotic abuse 12/09/2014   Sepsis 12/07/2014   Anxiety    Pyelonephritis 12/04/2014   Lactic acidosis 12/04/2014    Class: Acute   PCP:  [facility] Pharmacy:   [facility] [address], [place] - [address] [place]  [place] [address] Phone: [phone] Fax: [phone]  [id number] [place], [place] - [address] [place] [address] Phone: [phone] Fax: [phone]     Social Determinants of Health (SDOH) Social History: SDOH Screenings   Food Insecurity: No Food Insecurity (08/01/2018)  Transportation Needs: No Transportation Needs (08/01/2018)  Financial Resource Strain: Low Risk  (08/01/2018)  Physical Activity: Inactive (08/01/2018)  Social Connections: Moderately Isolated (08/01/2018)  Stress: Stress Concern Present (08/01/2018)  Tobacco Use: High Risk (07/16/2022)   SDOH Interventions:     Readmission Risk Interventions    06/28/2020    2:24 PM  Readmission Risk Prevention Plan  Transportation Screening Complete  PCP or Specialist Appt within 3-5 Days Complete  HRI or Home Care Consult Complete  Social Work Consult for Recovery Care Planning/Counseling Complete  Palliative Care Screening Not Applicable  Medication Review Ocean[employer]) Complete

## 2022-07-17 LAB — LEVETIRACETAM LEVEL: Levetiracetam Lvl: 9.5 ug/mL — ABNORMAL LOW (ref 10.0–40.0)

## 2022-07-24 DIAGNOSIS — Z419 Encounter for procedure for purposes other than remedying health state, unspecified: Secondary | ICD-10-CM | POA: Diagnosis not present

## 2022-07-31 DIAGNOSIS — F332 Major depressive disorder, recurrent severe without psychotic features: Secondary | ICD-10-CM | POA: Diagnosis not present

## 2022-08-08 DIAGNOSIS — F332 Major depressive disorder, recurrent severe without psychotic features: Secondary | ICD-10-CM | POA: Diagnosis not present

## 2022-08-24 DIAGNOSIS — Z419 Encounter for procedure for purposes other than remedying health state, unspecified: Secondary | ICD-10-CM | POA: Diagnosis not present

## 2022-08-27 DIAGNOSIS — F332 Major depressive disorder, recurrent severe without psychotic features: Secondary | ICD-10-CM | POA: Diagnosis not present

## 2022-09-05 DIAGNOSIS — F332 Major depressive disorder, recurrent severe without psychotic features: Secondary | ICD-10-CM | POA: Diagnosis not present

## 2022-09-19 DIAGNOSIS — F332 Major depressive disorder, recurrent severe without psychotic features: Secondary | ICD-10-CM | POA: Diagnosis not present

## 2022-09-23 DIAGNOSIS — Z419 Encounter for procedure for purposes other than remedying health state, unspecified: Secondary | ICD-10-CM | POA: Diagnosis not present

## 2022-10-24 DIAGNOSIS — F332 Major depressive disorder, recurrent severe without psychotic features: Secondary | ICD-10-CM | POA: Diagnosis not present

## 2022-10-24 DIAGNOSIS — Z419 Encounter for procedure for purposes other than remedying health state, unspecified: Secondary | ICD-10-CM | POA: Diagnosis not present

## 2022-11-24 DIAGNOSIS — Z419 Encounter for procedure for purposes other than remedying health state, unspecified: Secondary | ICD-10-CM | POA: Diagnosis not present

## 2022-12-24 DIAGNOSIS — Z419 Encounter for procedure for purposes other than remedying health state, unspecified: Secondary | ICD-10-CM | POA: Diagnosis not present

## 2023-01-24 DIAGNOSIS — Z419 Encounter for procedure for purposes other than remedying health state, unspecified: Secondary | ICD-10-CM | POA: Diagnosis not present

## 2023-02-23 DIAGNOSIS — Z419 Encounter for procedure for purposes other than remedying health state, unspecified: Secondary | ICD-10-CM | POA: Diagnosis not present

## 2023-03-26 DIAGNOSIS — Z419 Encounter for procedure for purposes other than remedying health state, unspecified: Secondary | ICD-10-CM | POA: Diagnosis not present

## 2023-03-30 ENCOUNTER — Other Ambulatory Visit: Payer: Self-pay

## 2023-03-30 ENCOUNTER — Emergency Department: Payer: 59

## 2023-03-30 ENCOUNTER — Emergency Department
Admission: EM | Admit: 2023-03-30 | Discharge: 2023-03-30 | Disposition: A | Discharge status: Home / Self Care | Payer: 59 | Attending: Emergency Medicine | Admitting: Emergency Medicine

## 2023-03-30 DIAGNOSIS — B9689 Other specified bacterial agents as the cause of diseases classified elsewhere: Secondary | ICD-10-CM | POA: Diagnosis not present

## 2023-03-30 DIAGNOSIS — R739 Hyperglycemia, unspecified: Secondary | ICD-10-CM | POA: Diagnosis not present

## 2023-03-30 DIAGNOSIS — R000 Tachycardia, unspecified: Secondary | ICD-10-CM | POA: Diagnosis not present

## 2023-03-30 DIAGNOSIS — R1111 Vomiting without nausea: Secondary | ICD-10-CM | POA: Diagnosis not present

## 2023-03-30 DIAGNOSIS — Z20822 Contact with and (suspected) exposure to covid-19: Secondary | ICD-10-CM | POA: Insufficient documentation

## 2023-03-30 DIAGNOSIS — R251 Tremor, unspecified: Secondary | ICD-10-CM | POA: Diagnosis not present

## 2023-03-30 DIAGNOSIS — E1165 Type 2 diabetes mellitus with hyperglycemia: Secondary | ICD-10-CM | POA: Diagnosis not present

## 2023-03-30 DIAGNOSIS — F445 Conversion disorder with seizures or convulsions: Secondary | ICD-10-CM

## 2023-03-30 DIAGNOSIS — R569 Unspecified convulsions: Secondary | ICD-10-CM | POA: Diagnosis not present

## 2023-03-30 DIAGNOSIS — N390 Urinary tract infection, site not specified: Secondary | ICD-10-CM

## 2023-03-30 DIAGNOSIS — R6883 Chills (without fever): Secondary | ICD-10-CM | POA: Diagnosis not present

## 2023-03-30 DIAGNOSIS — Z743 Need for continuous supervision: Secondary | ICD-10-CM | POA: Diagnosis not present

## 2023-03-30 LAB — HCG, QUANTITATIVE, PREGNANCY: hCG, Beta Chain, Quant, S: 1 m[IU]/mL (ref <5)

## 2023-03-30 LAB — BLOOD GAS, VENOUS
Acid-Base Excess: 3.2 mmol/L — ABNORMAL HIGH (ref 0.0–2.0)
Bicarbonate: 28.5 mmol/L — ABNORMAL HIGH (ref 20.0–28.0)
O2 Saturation: 97.5 %
Patient temperature: 37
pCO2, Ven: 45 mm[Hg] (ref 44–60)
pH, Ven: 7.41 (ref 7.25–7.43)
pO2, Ven: 75 mm[Hg] — ABNORMAL HIGH (ref 32–45)

## 2023-03-30 LAB — LACTIC ACID, PLASMA
Lactic Acid, Venous: 2.6 mmol/L (ref 0.5–1.9)
Lactic Acid, Venous: 2.5 mmol/L (ref 0.5–1.9)

## 2023-03-30 LAB — COMPREHENSIVE METABOLIC PANEL
ALT: 8 U/L (ref 0–44)
AST: 14 U/L — ABNORMAL LOW (ref 15–41)
Albumin: 4.3 g/dL (ref 3.5–5.0)
Alkaline Phosphatase: 56 U/L (ref 38–126)
Anion gap: 15 (ref 5–15)
BUN: 12 mg/dL (ref 6–20)
CO2: 24 mmol/L (ref 22–32)
Calcium: 9.8 mg/dL (ref 8.9–10.3)
Chloride: 95 mmol/L — ABNORMAL LOW (ref 98–111)
Creatinine, Ser: 0.71 mg/dL (ref 0.44–1.00)
GFR, Estimated: >60 mL/min (ref >60)
Glucose, Bld: 400 mg/dL — ABNORMAL HIGH (ref 70–99)
Potassium: 3.9 mmol/L (ref 3.5–5.1)
Sodium: 134 mmol/L — ABNORMAL LOW (ref 135–145)
Total Bilirubin: 0.5 mg/dL (ref 0.0–1.2)
Total Protein: 8.3 g/dL — ABNORMAL HIGH (ref 6.5–8.1)

## 2023-03-30 LAB — BASIC METABOLIC PANEL
Anion gap: 17 — ABNORMAL HIGH (ref 5–15)
BUN: 14 mg/dL (ref 6–20)
CO2: 24 mmol/L (ref 22–32)
Calcium: 9.7 mg/dL (ref 8.9–10.3)
Chloride: 99 mmol/L (ref 98–111)
Creatinine, Ser: 0.62 mg/dL (ref 0.44–1.00)
GFR, Estimated: >60 mL/min (ref >60)
Glucose, Bld: 138 mg/dL — ABNORMAL HIGH (ref 70–99)
Potassium: 3.7 mmol/L (ref 3.5–5.1)
Sodium: 140 mmol/L (ref 135–145)

## 2023-03-30 LAB — CBC
HCT: 40.7 % (ref 36.0–46.0)
Hemoglobin: 13.7 g/dL (ref 12.0–15.0)
MCH: 29 pg (ref 26.0–34.0)
MCHC: 33.7 g/dL (ref 30.0–36.0)
MCV: 86 fL (ref 80.0–100.0)
Platelets: 277 10*3/uL (ref 150–400)
RBC: 4.73 MIL/uL (ref 3.87–5.11)
RDW: 12.8 % (ref 11.5–15.5)
WBC: 12.8 10*3/uL — ABNORMAL HIGH (ref 4.0–10.5)
nRBC: 0 % (ref 0.0–0.2)

## 2023-03-30 LAB — URINALYSIS, ROUTINE W REFLEX MICROSCOPIC
Bilirubin Urine: NEGATIVE
Glucose, UA: >=500 mg/dL — AB
Ketones, ur: 20 mg/dL — AB
Nitrite: POSITIVE — AB
Protein, ur: >=300 mg/dL — AB
Specific Gravity, Urine: 1.032 — ABNORMAL HIGH (ref 1.005–1.030)
pH: 5 (ref 5.0–8.0)

## 2023-03-30 LAB — URINE DRUG SCREEN, QUALITATIVE (ARMC ONLY)
Amphetamines, Ur Screen: NOT DETECTED
Barbiturates, Ur Screen: NOT DETECTED
Benzodiazepine, Ur Scrn: POSITIVE — AB
Cannabinoid 50 Ng, Ur ~~LOC~~: POSITIVE — AB
Cocaine Metabolite,Ur ~~LOC~~: NOT DETECTED
MDMA (Ecstasy)Ur Screen: NOT DETECTED
Methadone Scn, Ur: NOT DETECTED
Opiate, Ur Screen: NOT DETECTED
Phencyclidine (PCP) Ur S: NOT DETECTED
Tricyclic, Ur Screen: NOT DETECTED

## 2023-03-30 LAB — BETA-HYDROXYBUTYRIC ACID: Beta-Hydroxybutyric Acid: 1.19 mmol/L — ABNORMAL HIGH (ref 0.05–0.27)

## 2023-03-30 LAB — CBG MONITORING, ED
Glucose-Capillary: 398 mg/dL — ABNORMAL HIGH (ref 70–99)
Glucose-Capillary: 355 mg/dL — ABNORMAL HIGH (ref 70–99)
Glucose-Capillary: 128 mg/dL — ABNORMAL HIGH (ref 70–99)

## 2023-03-30 LAB — LIPASE, BLOOD: Lipase: 26 U/L (ref 11–51)

## 2023-03-30 LAB — RESP PANEL BY RT-PCR (RSV, FLU A&B, COVID)  RVPGX2
Influenza A by PCR: NEGATIVE
Influenza B by PCR: NEGATIVE
Resp Syncytial Virus by PCR: NEGATIVE
SARS Coronavirus 2 by RT PCR: NEGATIVE

## 2023-03-30 MED ORDER — INSULIN ASPART 100 UNIT/ML IJ SOLN
4.0000 [IU] | Freq: Once | INTRAMUSCULAR | Status: AC
  Administered 2023-03-30: 4 [IU] via INTRAVENOUS
  Filled 2023-03-30: qty 1

## 2023-03-30 MED ORDER — LORAZEPAM 2 MG/ML IJ SOLN
1.0000 mg | Freq: Once | INTRAMUSCULAR | Status: AC
  Administered 2023-03-30: 1 mg via INTRAVENOUS
  Filled 2023-03-30: qty 1

## 2023-03-30 MED ORDER — CEFPODOXIME PROXETIL 200 MG PO TABS: 200.0000 mg | ORAL_TABLET | Freq: Two times a day (BID) | ORAL | 0 refills | Status: AC

## 2023-03-30 MED ORDER — KETOROLAC TROMETHAMINE 30 MG/ML IJ SOLN
15.0000 mg | Freq: Once | INTRAMUSCULAR | Status: AC
  Administered 2023-03-30: 15 mg via INTRAVENOUS
  Filled 2023-03-30: qty 1

## 2023-03-30 MED ORDER — LACTATED RINGERS IV BOLUS
1000.0000 mL | Freq: Once | INTRAVENOUS | Status: AC
  Administered 2023-03-30: 1000 mL via INTRAVENOUS

## 2023-03-30 NOTE — ED Notes (Signed)
 Pt tearful at bedside requesting water. MD made aware.

## 2023-03-30 NOTE — ED Notes (Signed)
 Pt c/o abd pain 10/10. Requesting pain meds and water.

## 2023-03-30 NOTE — ED Provider Notes (Addendum)
 Emergency department handoff note  Care of this patient was signed out to me at the end of the previous provider shift.  All pertinent patient information was conveyed and all questions were answered.  Patient pending repeat lab labs, p.o. tolerance and improvement in patient's mental status.  Patient had urinalysis that was positive for nitrites, leukocytes, many bacteria.  Patient also had urine drug screen positive for cannabis and benzodiazepines.  Patient's repeat BMP did come back with elevated anion gap however patient CO2 has not decreased and I have little concern for DKA at this time.  I offered admission to the patient however she refused at this time stating that she wanted to go home and that we were not doing anything for her pain.  When asked where patient is hurting she says it is from my seizures.  As patient is not having any further seizure activity, she was encouraged that this pain should subside soon.  Patient does show signs of of urinary tract infection and will be treated with cefpodoxime  in the outpatient setting.  The patient has been reexamined and is ready to be discharged.  All diagnostic results have been reviewed and discussed with the patient/family.  Care plan has been outlined and the patient/family understands all current diagnoses, results, and treatment plans.  There are no new complaints, changes, or physical findings at this time.  All questions have been addressed and answered.  Patient was instructed to, and agrees to follow-up with their primary care physician as well as return to the emergency department if any new or worsening symptoms develop.   Jossie Artist POUR, MD 03/30/23 1801    Andee Chivers K, MD 03/30/23 [phone]

## 2023-03-30 NOTE — ED Triage Notes (Signed)
 Pt in via [facility] for seizure like activity. Per EMS pt is seizing. Pt having seizure like activity at bedside but is A&O and able to answer questions and follow commands. MD at bedside.     7.5mg  of versed  given per EMS  Vitals per EMS: HR -140's [id number]

## 2023-03-30 NOTE — ED Notes (Signed)
 P: labs, reeval, DC   Merwyn Katos, MD 03/30/23 [phone]

## 2023-03-30 NOTE — ED Notes (Signed)
 NT [name] attempting to grab a BP. Unable to get a BP at this time due to seizure like activity from pt.

## 2023-03-30 NOTE — ED Notes (Signed)
 Pt still exhibiting seizure like activity holding arms out and shaking herself up and down on the bed. Pt stated she needed to vomit but was unable to. Denies nausea at this time.

## 2023-03-30 NOTE — ED Provider Notes (Signed)
 [facility] Provider Note    Event Date/Time   First MD Initiated Contact with Patient 03/30/23 1339     (approximate)   History   Seizures   HPI  Rachael Gray is a 43 y.o. female who reports she has had several seizures today.  Patient relates that she will have seizures if she is having pain.  She reports that she has been feeling achy and fatigued for a couple of days.  No headache, no chest pain no shortness of breath.  She reports that she has not sought medical care as she has several children at home whom she cares for.  She does have a history of seizures.  EMS reports the patient was having full body shaking, but also conversant and speaking with them.  The family gave her a dose of her Keppra  and EMS gave her 2 doses of midazolam   Condition advises that she feels achy.  She is not having any pain in any 1 spot though her whole body just feels fatigued and achy.       Physical Exam   Triage Vital Signs: ED Triage Vitals  Encounter Vitals Group     BP --      Systolic BP Percentile --      Diastolic BP Percentile --      Pulse Rate 03/30/23 1347 (!) 167     Resp 03/30/23 1347 (!) 48     Temp --      Temp src --      SpO2 03/30/23 1347 95 %     Weight 03/30/23 1342 160 lb 0.9 oz (72.6 kg)     Height 03/30/23 1342 5' 5 (1.651 m)     Head Circumference --      Peak Flow --      Pain Score 03/30/23 1341 7     Pain Loc --      Pain Education --      Exclude from Growth Chart --     Most recent vital signs: Vitals:   03/30/23 1358 03/30/23 1600  BP: 126/84 (!) 153/109  Pulse:  (!) 127  Resp:  20  SpO2:  95%     General: Awake, almost doing sit up like flexions of the abdomen, shaking her hands with wide movements and her arms and upper extremities.  This does not appear to represent obvious tonic-clonic activity, and she is awake and fully alert and oriented.  She follows commands.  When I asked her to be calm so I can  listen to her lung sounds and evaluate further she is able to control her shaking and actually stopped shaking for about 30 seconds to allow me to complete physical examination. CV:  Good peripheral perfusion.  Normal tones rate slightly tachycardic Resp:  Normal effort.  Clear bilateral.  When asked to calm her respiratory pattern becomes normal, she is able to control her breathing and does not exhibit any respiratory distress Abd:  No distention.  Soft nontender nondistended throughout Other:  Follows commands moves all extremities, when asked to rest her shaking arms and legs she does so.   ED Results / Procedures / Treatments   Labs (all labs ordered are listed, but only abnormal results are displayed) Labs Reviewed  URINALYSIS, ROUTINE W REFLEX MICROSCOPIC - Abnormal; Notable for the following components:      Result Value   Color, Urine YELLOW (*)    APPearance HAZY (*)    Specific  Gravity, Urine 1.032 (*)    Glucose, UA >=500 (*)    Hgb urine dipstick SMALL (*)    Ketones, ur 20 (*)    Protein, ur >=300 (*)    Nitrite POSITIVE (*)    Leukocytes,Ua TRACE (*)    Bacteria, UA MANY (*)    All other components within normal limits  CBC - Abnormal; Notable for the following components:   WBC 12.8 (*)    All other components within normal limits  COMPREHENSIVE METABOLIC PANEL - Abnormal; Notable for the following components:   Sodium 134 (*)    Chloride 95 (*)    Glucose, Bld 400 (*)    Total Protein 8.3 (*)    AST 14 (*)    All other components within normal limits  URINE DRUG SCREEN, QUALITATIVE ([facility] ONLY) - Abnormal; Notable for the following components:   Cannabinoid 50 Ng, Ur [place] POSITIVE (*)    Benzodiazepine, Ur Scrn POSITIVE (*)    All other components within normal limits  BLOOD GAS, VENOUS - Abnormal; Notable for the following components:   pO2, Ven 75 (*)    Bicarbonate 28.5 (*)    Acid-Base Excess 3.2 (*)    All other components within normal limits   BETA-HYDROXYBUTYRIC ACID - Abnormal; Notable for the following components:   Beta-Hydroxybutyric Acid 1.19 (*)    All other components within normal limits  LACTIC ACID, PLASMA - Abnormal; Notable for the following components:   Lactic Acid, Venous 2.6 (*)    All other components within normal limits  BASIC METABOLIC PANEL - Abnormal; Notable for the following components:   Glucose, Bld 138 (*)    Anion gap 17 (*)    All other components within normal limits  LACTIC ACID, PLASMA - Abnormal; Notable for the following components:   Lactic Acid, Venous 2.5 (*)    All other components within normal limits  CBG MONITORING, ED - Abnormal; Notable for the following components:   Glucose-Capillary 398 (*)    All other components within normal limits  CBG MONITORING, ED - Abnormal; Notable for the following components:   Glucose-Capillary 355 (*)    All other components within normal limits  CBG MONITORING, ED - Abnormal; Notable for the following components:   Glucose-Capillary 128 (*)    All other components within normal limits  RESP PANEL BY RT-PCR (RSV, FLU A&B, COVID)  [id number]  HCG, QUANTITATIVE, PREGNANCY  LIPASE, BLOOD    RADIOLOGY Chest x-ray inter by me as negative for acute finding    PROCEDURES:  Critical Care performed: No  Procedures   MEDICATIONS ORDERED IN ED: Medications  LORazepam  (ATIVAN ) injection 1 mg (1 mg Intravenous Given 03/30/23 1402)  lactated ringers  bolus 1,000 mL (0 mLs Intravenous Stopped 03/30/23 1746)  insulin  aspart (novoLOG ) injection 4 Units (4 Units Intravenous Given 03/30/23 1455)  ketorolac  (TORADOL ) 30 MG/ML injection 15 mg (15 mg Intravenous Given 03/30/23 1623)     IMPRESSION / MDM / ASSESSMENT AND PLAN / ED COURSE  I reviewed the triage vital signs and the nursing notes.                                Patient's presentation is most consistent with acute complicated illness / injury requiring diagnostic workup.  I spent time reviewing  the patient's prior charts and discharge summaries, it seems that she has presented in the past with shaking type activity suspicious  for psychogenic seizures on prior admissions as well.  In the past she has been found to have a history of underlying metabolic abnormalities DKA, lupus, substance abuse etc.  On presentation her symptoms of shaking, gesturing with her hands, but being fully alert oriented and conversing with me at the same time do not appear to be consistent with an acute generalized epileptic seizure at this time.  Rather this I think is more of an outward expression or underlying psychiatric causation.  She may well have underlying medical illness though prompting her to demonstrate this pseudoseizure like tremulousness/activity.  However, I am inclined to believe that this is not from acute epilepsy.  Will provide medical workup including CBC metabolic panel, evaluate for infectious causes viral illnesses etc.  She has no focal abdominal pain.  Her initial vital signs include tachycardia and tachypnea but she is also breathing rapidly sitting up back-and-forth with waving her hands back-and-forth in a rhythmic fashion.  Will give 1 mg of IV Ativan  for anxiolysis   Ongoing care assigned to Dr. Burlene with plan to follow-up on reassessment, pending urinalysis and follow-up on hyperglycemia/repeat glucose and metabolic panel       FINAL CLINICAL IMPRESSION(S) / ED DIAGNOSES   Final diagnoses:  Psychogenic nonepileptic seizure  Hyperglycemia  Lower urinary tract infectious disease     Rx / DC Orders   ED Discharge Orders          Ordered    cefpodoxime  (VANTIN ) 200 MG tablet  2 times daily        03/30/23 1757             Note:  This document was prepared using Dragon voice recognition software and may include unintentional dictation errors.   Dicky Anes, MD 03/31/23 [phone]

## 2023-03-30 NOTE — ED Notes (Addendum)
 Pt found walking around in the ER hallways. This RN asked if the pt was leaving. Pt stated I'm walking around. This RN explained to pt that she would need to return to her rm or if she was leaving that IV's would need to be taken out. Pt headed back to her rm and is currently standing in doorway.

## 2023-04-26 DIAGNOSIS — Z419 Encounter for procedure for purposes other than remedying health state, unspecified: Secondary | ICD-10-CM | POA: Diagnosis not present

## 2023-05-24 DIAGNOSIS — Z419 Encounter for procedure for purposes other than remedying health state, unspecified: Secondary | ICD-10-CM | POA: Diagnosis not present

## 2023-07-05 DIAGNOSIS — Z419 Encounter for procedure for purposes other than remedying health state, unspecified: Secondary | ICD-10-CM | POA: Diagnosis not present

## 2023-07-28 ENCOUNTER — Emergency Department

## 2023-07-28 ENCOUNTER — Inpatient Hospital Stay
Admission: EM | Admit: 2023-07-28 | Discharge: 2023-07-30 | DRG: 379 | Disposition: A | Discharge status: Home / Self Care | Attending: Internal Medicine | Admitting: Internal Medicine

## 2023-07-28 ENCOUNTER — Other Ambulatory Visit: Payer: Self-pay

## 2023-07-28 DIAGNOSIS — M329 Systemic lupus erythematosus, unspecified: Secondary | ICD-10-CM | POA: Diagnosis present

## 2023-07-28 DIAGNOSIS — I10 Essential (primary) hypertension: Secondary | ICD-10-CM | POA: Diagnosis present

## 2023-07-28 DIAGNOSIS — F17210 Nicotine dependence, cigarettes, uncomplicated: Secondary | ICD-10-CM | POA: Diagnosis present

## 2023-07-28 DIAGNOSIS — Z91018 Allergy to other foods: Secondary | ICD-10-CM

## 2023-07-28 DIAGNOSIS — Z803 Family history of malignant neoplasm of breast: Secondary | ICD-10-CM

## 2023-07-28 DIAGNOSIS — Z886 Allergy status to analgesic agent status: Secondary | ICD-10-CM

## 2023-07-28 DIAGNOSIS — D72829 Elevated white blood cell count, unspecified: Secondary | ICD-10-CM | POA: Diagnosis present

## 2023-07-28 DIAGNOSIS — K922 Gastrointestinal hemorrhage, unspecified: Secondary | ICD-10-CM | POA: Diagnosis present

## 2023-07-28 DIAGNOSIS — Z888 Allergy status to other drugs, medicaments and biological substances status: Secondary | ICD-10-CM

## 2023-07-28 DIAGNOSIS — Z881 Allergy status to other antibiotic agents status: Secondary | ICD-10-CM

## 2023-07-28 DIAGNOSIS — K920 Hematemesis: Principal | ICD-10-CM | POA: Diagnosis present

## 2023-07-28 DIAGNOSIS — R3129 Other microscopic hematuria: Secondary | ICD-10-CM | POA: Diagnosis present

## 2023-07-28 DIAGNOSIS — K3184 Gastroparesis: Secondary | ICD-10-CM | POA: Diagnosis present

## 2023-07-28 DIAGNOSIS — E876 Hypokalemia: Secondary | ICD-10-CM | POA: Diagnosis present

## 2023-07-28 DIAGNOSIS — Z794 Long term (current) use of insulin: Secondary | ICD-10-CM

## 2023-07-28 DIAGNOSIS — R110 Nausea: Secondary | ICD-10-CM | POA: Diagnosis not present

## 2023-07-28 DIAGNOSIS — F445 Conversion disorder with seizures or convulsions: Principal | ICD-10-CM | POA: Diagnosis present

## 2023-07-28 DIAGNOSIS — E1143 Type 2 diabetes mellitus with diabetic autonomic (poly)neuropathy: Secondary | ICD-10-CM | POA: Diagnosis present

## 2023-07-28 DIAGNOSIS — E119 Type 2 diabetes mellitus without complications: Secondary | ICD-10-CM

## 2023-07-28 DIAGNOSIS — G40909 Epilepsy, unspecified, not intractable, without status epilepticus: Secondary | ICD-10-CM

## 2023-07-28 DIAGNOSIS — F419 Anxiety disorder, unspecified: Secondary | ICD-10-CM | POA: Diagnosis present

## 2023-07-28 DIAGNOSIS — E1165 Type 2 diabetes mellitus with hyperglycemia: Secondary | ICD-10-CM | POA: Diagnosis present

## 2023-07-28 DIAGNOSIS — R739 Hyperglycemia, unspecified: Secondary | ICD-10-CM | POA: Diagnosis not present

## 2023-07-28 DIAGNOSIS — R410 Disorientation, unspecified: Secondary | ICD-10-CM | POA: Diagnosis not present

## 2023-07-28 DIAGNOSIS — F1220 Cannabis dependence, uncomplicated: Secondary | ICD-10-CM | POA: Diagnosis present

## 2023-07-28 DIAGNOSIS — R000 Tachycardia, unspecified: Secondary | ICD-10-CM | POA: Diagnosis not present

## 2023-07-28 DIAGNOSIS — O479 False labor, unspecified: Secondary | ICD-10-CM | POA: Diagnosis not present

## 2023-07-28 LAB — URINALYSIS, W/ REFLEX TO CULTURE (INFECTION SUSPECTED)
Bacteria, UA: NONE SEEN
Bilirubin Urine: NEGATIVE
Glucose, UA: >=500 mg/dL — AB
Ketones, ur: 20 mg/dL — AB
Leukocytes,Ua: NEGATIVE
Nitrite: NEGATIVE
Protein, ur: >=300 mg/dL — AB
Specific Gravity, Urine: 1.021 (ref 1.005–1.030)
pH: 5 (ref 5.0–8.0)

## 2023-07-28 LAB — CBC WITH DIFFERENTIAL/PLATELET
Abs Immature Granulocytes: 0.11 10*3/uL — ABNORMAL HIGH (ref 0.00–0.07)
Basophils Absolute: 0.1 10*3/uL (ref 0.0–0.1)
Basophils Relative: 0 %
Eosinophils Absolute: 11.6 10*3/uL — ABNORMAL HIGH (ref 0.0–0.5)
Eosinophils Relative: 36 %
HCT: 39.2 % (ref 36.0–46.0)
Hemoglobin: 13.3 g/dL (ref 12.0–15.0)
Immature Granulocytes: 0 %
Lymphocytes Relative: 3 %
Lymphs Abs: 1.1 10*3/uL (ref 0.7–4.0)
MCH: 29 pg (ref 26.0–34.0)
MCHC: 33.9 g/dL (ref 30.0–36.0)
MCV: 85.4 fL (ref 80.0–100.0)
Monocytes Absolute: 1.4 10*3/uL — ABNORMAL HIGH (ref 0.1–1.0)
Monocytes Relative: 4 %
Neutro Abs: 17.7 10*3/uL — ABNORMAL HIGH (ref 1.7–7.7)
Neutrophils Relative %: 57 %
Platelets: 240 10*3/uL (ref 150–400)
RBC: 4.59 MIL/uL (ref 3.87–5.11)
RDW: 13.2 % (ref 11.5–15.5)
Smear Review: NORMAL
WBC: 31.9 10*3/uL — ABNORMAL HIGH (ref 4.0–10.5)
nRBC: 0 % (ref 0.0–0.2)

## 2023-07-28 LAB — POC URINE PREG, ED: Preg Test, Ur: NEGATIVE

## 2023-07-28 LAB — URINE DRUG SCREEN, QUALITATIVE (ARMC ONLY)
Amphetamines, Ur Screen: NOT DETECTED
Barbiturates, Ur Screen: NOT DETECTED
Benzodiazepine, Ur Scrn: POSITIVE — AB
Cannabinoid 50 Ng, Ur ~~LOC~~: POSITIVE — AB
Cocaine Metabolite,Ur ~~LOC~~: NOT DETECTED
MDMA (Ecstasy)Ur Screen: NOT DETECTED
Methadone Scn, Ur: NOT DETECTED
Opiate, Ur Screen: NOT DETECTED
Phencyclidine (PCP) Ur S: NOT DETECTED
Tricyclic, Ur Screen: NOT DETECTED

## 2023-07-28 LAB — COMPREHENSIVE METABOLIC PANEL WITH GFR
ALT: 11 U/L (ref 0–44)
AST: 22 U/L (ref 15–41)
Albumin: 4.9 g/dL (ref 3.5–5.0)
Alkaline Phosphatase: 46 U/L (ref 38–126)
Anion gap: 17 — ABNORMAL HIGH (ref 5–15)
BUN: 16 mg/dL (ref 6–20)
CO2: 21 mmol/L — ABNORMAL LOW (ref 22–32)
Calcium: 9.8 mg/dL (ref 8.9–10.3)
Chloride: 98 mmol/L (ref 98–111)
Creatinine, Ser: 0.63 mg/dL (ref 0.44–1.00)
GFR, Estimated: >60 mL/min (ref >60)
Glucose, Bld: 255 mg/dL — ABNORMAL HIGH (ref 70–99)
Potassium: 3.6 mmol/L (ref 3.5–5.1)
Sodium: 136 mmol/L (ref 135–145)
Total Bilirubin: 1 mg/dL (ref 0.0–1.2)
Total Protein: 8.6 g/dL — ABNORMAL HIGH (ref 6.5–8.1)

## 2023-07-28 LAB — TYPE AND SCREEN
ABO/RH(D): A POS
Antibody Screen: NEGATIVE

## 2023-07-28 LAB — ETHANOL: Alcohol, Ethyl (B): <15 mg/dL (ref <15)

## 2023-07-28 LAB — PROTIME-INR
INR: 1.1 (ref 0.8–1.2)
Prothrombin Time: 14.3 s (ref 11.4–15.2)

## 2023-07-28 LAB — PH, GASTRIC FLUID (GASTROCCULT CARD): pH, Gastric: 4

## 2023-07-28 LAB — HCG, QUANTITATIVE, PREGNANCY: hCG, Beta Chain, Quant, S: <1 m[IU]/mL (ref <5)

## 2023-07-28 LAB — APTT: aPTT: <22 s — ABNORMAL LOW (ref 24–36)

## 2023-07-28 LAB — LIPASE, BLOOD: Lipase: 24 U/L (ref 11–51)

## 2023-07-28 MED ORDER — PANTOPRAZOLE SODIUM 40 MG IV SOLR
40.0000 mg | Freq: Once | INTRAVENOUS | Status: AC
  Administered 2023-07-28: 40 mg via INTRAVENOUS
  Filled 2023-07-28: qty 10

## 2023-07-28 MED ORDER — SODIUM CHLORIDE 0.9 % IV BOLUS
1000.0000 mL | Freq: Once | INTRAVENOUS | Status: AC
  Administered 2023-07-28: 1000 mL via INTRAVENOUS

## 2023-07-28 MED ORDER — METOCLOPRAMIDE HCL 5 MG/ML IJ SOLN: 10.0000 mg | INTRAMUSCULAR | Status: DC

## 2023-07-28 MED ORDER — IOHEXOL 300 MG/ML  SOLN
100.0000 mL | Freq: Once | INTRAMUSCULAR | Status: AC | PRN
  Administered 2023-07-28: 100 mL via INTRAVENOUS

## 2023-07-28 MED ORDER — HALOPERIDOL LACTATE 5 MG/ML IJ SOLN
3.0000 mg | Freq: Once | INTRAMUSCULAR | Status: AC
  Administered 2023-07-28: 3 mg via INTRAVENOUS
  Filled 2023-07-28: qty 1

## 2023-07-28 MED ORDER — SODIUM CHLORIDE 0.9 % IV SOLN
2.0000 g | Freq: Once | INTRAVENOUS | Status: AC
  Administered 2023-07-28: 2 g via INTRAVENOUS
  Filled 2023-07-28: qty 20

## 2023-07-28 MED ORDER — METRONIDAZOLE 500 MG/100ML IV SOLN
500.0000 mg | Freq: Once | INTRAVENOUS | Status: AC
  Administered 2023-07-29: 500 mg via INTRAVENOUS
  Filled 2023-07-28: qty 100

## 2023-07-28 NOTE — ED Triage Notes (Signed)
 Patient arrives from home by [facility] with seizurelike activity.  EMS describes seizure on scene and gave 2.5mg  of versed .  She calmed down, but began getting nauseous.  She was given 4mg  of Zofran  IV.  She began having spasms again, and received another 2.5 mg Versed .  Patient arrives vomiting with blood tinged emesis.

## 2023-07-28 NOTE — ED Notes (Signed)
 Patient emesis when placed on gastroccult card showed positive for blood when exposed to test solution.  The control on the card showed a valid result.

## 2023-07-28 NOTE — ED Provider Notes (Signed)
 [facility] Provider Note    Event Date/Time   First MD Initiated Contact with Patient 07/28/23 1908     (approximate)   History   Chief Complaint: Seizures   HPI  Rachael Gray is a 43 y.o. female with a history of diabetes, hypertension, pseudoseizures who was brought to the ED by EMS due to suspected seizure.  EMS report a generalized tonic-clonic activity, resolved after giving 2.5 mg of intramuscular Versed .  They gave additional 2.5 mg afterward due to agitation, patient was awake and interactive at that time.  On arrival to the ED she complains of generalized abdominal pain.  She is vomiting bloody liquid.  Reports compliance with her medications.        Past Medical History:  Diagnosis Date   Collagen vascular disease (HCC)    Diabetes mellitus without complication (HCC)    Gastroparesis    Lupus    Pseudoseizures    Seizures [facility])     Current Outpatient Rx   Order #: [id number] Class: Normal   Order #: [id number] Class: Normal   Order #: [id number] Class: Normal   Order #: [id number] Class: Normal   Order #: [id number] Class: Normal    Past Surgical History:  Procedure Laterality Date   APPENDECTOMY     CHOLECYSTECTOMY     ESOPHAGOGASTRODUODENOSCOPY N/A 06/28/2020   Procedure: ESOPHAGOGASTRODUODENOSCOPY (EGD);  Surgeon: Irby Mannan, MD;  Location: [facility] ENDOSCOPY;  Service: Endoscopy;  Laterality: N/A;    Physical Exam   Triage Vital Signs: ED Triage Vitals  Encounter Vitals Group     BP      Systolic BP Percentile      Diastolic BP Percentile      Pulse      Resp      Temp      Temp src      SpO2      Weight      Height      Head Circumference      Peak Flow      Pain Score      Pain Loc      Pain Education      Exclude from Growth Chart     Most recent vital signs: Vitals:   07/28/23 2030 07/28/23 2130  BP: (!) 142/79 (!) 154/83  Pulse: (!) 109 (!) 103  Resp: 16 13  Temp:    SpO2: 100% 99%     General: Awake, ill-appearing.  CV:  Good peripheral perfusion.  Tachycardia heart rate 130 Resp:  Normal effort.  Clear to auscultation bilaterally Abd:  No distention.  Soft without focal tenderness Other:  Moving all extremities.   ED Results / Procedures / Treatments   Labs (all labs ordered are listed, but only abnormal results are displayed) Labs Reviewed  COMPREHENSIVE METABOLIC PANEL WITH GFR - Abnormal; Notable for the following components:      Result Value   CO2 21 (*)    Glucose, Bld 255 (*)    Total Protein 8.6 (*)    Anion gap 17 (*)    All other components within normal limits  CBC WITH DIFFERENTIAL/PLATELET - Abnormal; Notable for the following components:   WBC 31.9 (*)    Neutro Abs 17.7 (*)    Monocytes Absolute 1.4 (*)    Eosinophils Absolute 11.6 (*)    Abs Immature Granulocytes 0.11 (*)    All other components within normal limits  APTT - Abnormal; Notable for the following components:   aPTT <  22 (*)    All other components within normal limits  URINALYSIS, W/ REFLEX TO CULTURE (INFECTION SUSPECTED) - Abnormal; Notable for the following components:   Color, Urine YELLOW (*)    APPearance CLEAR (*)    Glucose, UA >=500 (*)    Hgb urine dipstick MODERATE (*)    Ketones, ur 20 (*)    Protein, ur >=300 (*)    All other components within normal limits  ETHANOL  LIPASE, BLOOD  PROTIME-INR  PH, GASTRIC FLUID (GASTROCCULT CARD)  HCG, QUANTITATIVE, PREGNANCY  URINE DRUG SCREEN, QUALITATIVE ([facility] ONLY)  PH, GASTRIC FLUID (GASTROCCULT CARD)  POC URINE PREG, ED  TYPE AND SCREEN     EKG Interpreted by me Sinus tachycardia rate 140.  Normal axis and intervals.  Poor R wave progression.  Normal ST segments and T waves   RADIOLOGY CT abdomen pelvis pending   PROCEDURES:  .Critical Care  Performed by: Jacquie Maudlin, MD Authorized by: Jacquie Maudlin, MD   Critical care provider statement:    Critical care time (minutes):  35    Critical care time was exclusive of:  Separately billable procedures and treating other patients   Critical care was necessary to treat or prevent imminent or life-threatening deterioration of the following conditions:  Sepsis, dehydration and circulatory failure   Critical care was time spent personally by me on the following activities:  Development of treatment plan with patient or surrogate, discussions with consultants, evaluation of patient's response to treatment, examination of patient, obtaining history from patient or surrogate, ordering and performing treatments and interventions, ordering and review of laboratory studies, ordering and review of radiographic studies, pulse oximetry, re-evaluation of patient's condition and review of old charts   Care discussed with: admitting provider      MEDICATIONS ORDERED IN ED: Medications  sodium chloride  0.9 % bolus 1,000 mL (0 mLs Intravenous Stopped 07/28/23 2101)  pantoprazole  (PROTONIX ) injection 40 mg (40 mg Intravenous Given 07/28/23 1953)  haloperidol  lactate (HALDOL ) injection 3 mg (3 mg Intravenous Given 07/28/23 1954)     IMPRESSION / MDM / ASSESSMENT AND PLAN / ED COURSE  I reviewed the triage vital signs and the nursing notes.  DDx: Upper GI bleed, gastroparesis, anemia, electrolyte derangement, AKI, dehydration, cannabinoid hyperemesis, bowel obstruction, pancreatitis  Patient's presentation is most consistent with acute presentation with potential threat to life or bodily function.  Patient brought to the ED after seizure-like activity at home.  Now awake and alert but vomiting what looks like bloody emesis.  Will give Protonix , Reglan , fluid bolus while obtaining labs.   Clinical Course as of 07/28/23 2243  Mon Jul 28, 2023  2148 Gastroccult confirms bloody emesis. Wbc 31k - CT ordered. Will need to admit for ugib. VS stable so far. Will obtain B. Cx, lactate, and give abx for suspected sepsis.  [PS]    Clinical Course User  Index [PS] Jacquie Maudlin, MD     FINAL CLINICAL IMPRESSION(S) / ED DIAGNOSES   Final diagnoses:  Hematemesis with nausea  Type 2 diabetes mellitus without complication, with long-term current use of insulin  (HCC)     Rx / DC Orders   ED Discharge Orders     None        Note:  This document was prepared using Dragon voice recognition software and may include unintentional dictation errors.   Jacquie Maudlin, MD 07/28/23 [phone]

## 2023-07-29 ENCOUNTER — Inpatient Hospital Stay

## 2023-07-29 DIAGNOSIS — E876 Hypokalemia: Secondary | ICD-10-CM | POA: Diagnosis not present

## 2023-07-29 DIAGNOSIS — E1165 Type 2 diabetes mellitus with hyperglycemia: Secondary | ICD-10-CM | POA: Diagnosis not present

## 2023-07-29 DIAGNOSIS — Z91018 Allergy to other foods: Secondary | ICD-10-CM | POA: Diagnosis not present

## 2023-07-29 DIAGNOSIS — Z9049 Acquired absence of other specified parts of digestive tract: Secondary | ICD-10-CM | POA: Diagnosis not present

## 2023-07-29 DIAGNOSIS — K922 Gastrointestinal hemorrhage, unspecified: Secondary | ICD-10-CM | POA: Diagnosis present

## 2023-07-29 DIAGNOSIS — I10 Essential (primary) hypertension: Secondary | ICD-10-CM

## 2023-07-29 DIAGNOSIS — M329 Systemic lupus erythematosus, unspecified: Secondary | ICD-10-CM | POA: Diagnosis not present

## 2023-07-29 DIAGNOSIS — K920 Hematemesis: Secondary | ICD-10-CM | POA: Diagnosis not present

## 2023-07-29 DIAGNOSIS — E1143 Type 2 diabetes mellitus with diabetic autonomic (poly)neuropathy: Secondary | ICD-10-CM

## 2023-07-29 DIAGNOSIS — R000 Tachycardia, unspecified: Secondary | ICD-10-CM | POA: Diagnosis not present

## 2023-07-29 DIAGNOSIS — F17210 Nicotine dependence, cigarettes, uncomplicated: Secondary | ICD-10-CM | POA: Diagnosis not present

## 2023-07-29 DIAGNOSIS — K3184 Gastroparesis: Secondary | ICD-10-CM | POA: Diagnosis not present

## 2023-07-29 DIAGNOSIS — R569 Unspecified convulsions: Secondary | ICD-10-CM | POA: Diagnosis not present

## 2023-07-29 DIAGNOSIS — F419 Anxiety disorder, unspecified: Secondary | ICD-10-CM | POA: Diagnosis not present

## 2023-07-29 DIAGNOSIS — F445 Conversion disorder with seizures or convulsions: Secondary | ICD-10-CM | POA: Diagnosis not present

## 2023-07-29 DIAGNOSIS — Z881 Allergy status to other antibiotic agents status: Secondary | ICD-10-CM | POA: Diagnosis not present

## 2023-07-29 DIAGNOSIS — D72829 Elevated white blood cell count, unspecified: Secondary | ICD-10-CM

## 2023-07-29 DIAGNOSIS — G40909 Epilepsy, unspecified, not intractable, without status epilepticus: Secondary | ICD-10-CM | POA: Diagnosis not present

## 2023-07-29 DIAGNOSIS — Z794 Long term (current) use of insulin: Secondary | ICD-10-CM | POA: Diagnosis not present

## 2023-07-29 DIAGNOSIS — Z886 Allergy status to analgesic agent status: Secondary | ICD-10-CM | POA: Diagnosis not present

## 2023-07-29 DIAGNOSIS — R109 Unspecified abdominal pain: Secondary | ICD-10-CM | POA: Diagnosis not present

## 2023-07-29 DIAGNOSIS — R3129 Other microscopic hematuria: Secondary | ICD-10-CM | POA: Diagnosis present

## 2023-07-29 DIAGNOSIS — Z803 Family history of malignant neoplasm of breast: Secondary | ICD-10-CM | POA: Diagnosis not present

## 2023-07-29 DIAGNOSIS — Z888 Allergy status to other drugs, medicaments and biological substances status: Secondary | ICD-10-CM | POA: Diagnosis not present

## 2023-07-29 DIAGNOSIS — F1220 Cannabis dependence, uncomplicated: Secondary | ICD-10-CM | POA: Diagnosis not present

## 2023-07-29 LAB — PH, GASTRIC FLUID (GASTROCCULT CARD)

## 2023-07-29 LAB — CBG MONITORING, ED
Glucose-Capillary: 235 mg/dL — ABNORMAL HIGH (ref 70–99)
Glucose-Capillary: 81 mg/dL (ref 70–99)
Glucose-Capillary: 127 mg/dL — ABNORMAL HIGH (ref 70–99)
Glucose-Capillary: 143 mg/dL — ABNORMAL HIGH (ref 70–99)

## 2023-07-29 LAB — HEMOGLOBIN A1C
Hgb A1c MFr Bld: 9.4 % — ABNORMAL HIGH (ref 4.8–5.6)
Mean Plasma Glucose: 223.08 mg/dL

## 2023-07-29 LAB — BASIC METABOLIC PANEL WITH GFR
Anion gap: 16 — ABNORMAL HIGH (ref 5–15)
BUN: 14 mg/dL (ref 6–20)
CO2: 20 mmol/L — ABNORMAL LOW (ref 22–32)
Calcium: 9.4 mg/dL (ref 8.9–10.3)
Chloride: 101 mmol/L (ref 98–111)
Creatinine, Ser: 0.4 mg/dL — ABNORMAL LOW (ref 0.44–1.00)
GFR, Estimated: >60 mL/min (ref >60)
Glucose, Bld: 236 mg/dL — ABNORMAL HIGH (ref 70–99)
Potassium: 3.4 mmol/L — ABNORMAL LOW (ref 3.5–5.1)
Sodium: 137 mmol/L (ref 135–145)

## 2023-07-29 LAB — CBC
HCT: 43.9 % (ref 36.0–46.0)
Hemoglobin: 14.3 g/dL (ref 12.0–15.0)
MCH: 28.4 pg (ref 26.0–34.0)
MCHC: 32.6 g/dL (ref 30.0–36.0)
MCV: 87.1 fL (ref 80.0–100.0)
Platelets: 220 10*3/uL (ref 150–400)
RBC: 5.04 MIL/uL (ref 3.87–5.11)
RDW: 13.3 % (ref 11.5–15.5)
WBC: 25.8 10*3/uL — ABNORMAL HIGH (ref 4.0–10.5)
nRBC: 0 % (ref 0.0–0.2)

## 2023-07-29 LAB — HEMOGLOBIN AND HEMATOCRIT, BLOOD
HCT: 39.8 % (ref 36.0–46.0)
Hemoglobin: 13 g/dL (ref 12.0–15.0)

## 2023-07-29 LAB — LACTIC ACID, PLASMA
Lactic Acid, Venous: 2.1 mmol/L (ref 0.5–1.9)
Lactic Acid, Venous: 1.7 mmol/L (ref 0.5–1.9)

## 2023-07-29 MED ORDER — SODIUM CHLORIDE 0.9 % IV BOLUS
1000.0000 mL | Freq: Once | INTRAVENOUS | Status: AC
  Administered 2023-07-29: 1000 mL via INTRAVENOUS

## 2023-07-29 MED ORDER — METOCLOPRAMIDE HCL 10 MG PO TABS
5.0000 mg | ORAL_TABLET | Freq: Three times a day (TID) | ORAL | Status: DC
Start: 2023-07-29 — End: 2023-07-30
  Administered 2023-07-29 – 2023-07-30 (×5): 5 mg via ORAL
  Filled 2023-07-29 (×5): qty 1

## 2023-07-29 MED ORDER — PANTOPRAZOLE SODIUM 40 MG IV SOLR
40.0000 mg | Freq: Two times a day (BID) | INTRAVENOUS | Status: DC
  Administered 2023-07-29 – 2023-07-30 (×3): 40 mg via INTRAVENOUS
  Filled 2023-07-29 (×4): qty 10

## 2023-07-29 MED ORDER — ACETAMINOPHEN 650 MG RE SUPP: 650.0000 mg | Freq: Four times a day (QID) | RECTAL | Status: DC | PRN

## 2023-07-29 MED ORDER — SODIUM CHLORIDE 0.9 % IV SOLN: INTRAVENOUS | Status: DC

## 2023-07-29 MED ORDER — MAGNESIUM HYDROXIDE 400 MG/5ML PO SUSP: 30.0000 mL | Freq: Every day | ORAL | Status: DC | PRN

## 2023-07-29 MED ORDER — MIRTAZAPINE 15 MG PO TBDP
45.0000 mg | ORAL_TABLET | Freq: Every day | ORAL | Status: DC
  Administered 2023-07-29: 45 mg via ORAL
  Filled 2023-07-29 (×2): qty 3

## 2023-07-29 MED ORDER — METOPROLOL TARTRATE 25 MG PO TABS
12.5000 mg | ORAL_TABLET | Freq: Two times a day (BID) | ORAL | Status: DC
  Administered 2023-07-29 – 2023-07-30 (×3): 12.5 mg via ORAL
  Filled 2023-07-29 (×3): qty 1

## 2023-07-29 MED ORDER — ONDANSETRON HCL 4 MG/2ML IJ SOLN
4.0000 mg | Freq: Four times a day (QID) | INTRAMUSCULAR | Status: DC | PRN
  Administered 2023-07-29 – 2023-07-30 (×2): 4 mg via INTRAVENOUS
  Filled 2023-07-29 (×2): qty 2

## 2023-07-29 MED ORDER — ACETAMINOPHEN 325 MG PO TABS
650.0000 mg | ORAL_TABLET | Freq: Four times a day (QID) | ORAL | Status: DC | PRN
  Filled 2023-07-29: qty 2

## 2023-07-29 MED ORDER — TRAZODONE HCL 50 MG PO TABS: 25.0000 mg | ORAL_TABLET | Freq: Every evening | ORAL | Status: DC | PRN

## 2023-07-29 MED ORDER — LORAZEPAM 2 MG/ML IJ SOLN
1.0000 mg | INTRAMUSCULAR | Status: DC | PRN
  Administered 2023-07-29 – 2023-07-30 (×4): 1 mg via INTRAVENOUS
  Filled 2023-07-29 (×4): qty 1

## 2023-07-29 MED ORDER — SODIUM CHLORIDE 0.9 % IV SOLN: 2.0000 g | INTRAVENOUS | Status: DC

## 2023-07-29 MED ORDER — INSULIN ASPART 100 UNIT/ML IJ SOLN
0.0000 [IU] | INTRAMUSCULAR | Status: DC
  Administered 2023-07-29: 3 [IU] via SUBCUTANEOUS
  Administered 2023-07-29: 7 [IU] via SUBCUTANEOUS
  Administered 2023-07-30 (×2): 3 [IU] via SUBCUTANEOUS
  Filled 2023-07-29 (×4): qty 1

## 2023-07-29 MED ORDER — ENOXAPARIN SODIUM 40 MG/0.4ML IJ SOSY
40.0000 mg | PREFILLED_SYRINGE | INTRAMUSCULAR | Status: DC
  Administered 2023-07-29 – 2023-07-30 (×2): 40 mg via SUBCUTANEOUS
  Filled 2023-07-29 (×3): qty 0.4

## 2023-07-29 NOTE — ED Notes (Signed)
 This RN gave report to Lindaann Requena RN and performed care handoff via phone call. pt updated on plan of care.

## 2023-07-29 NOTE — ED Notes (Signed)
 CBG is 81. First sample was not valid.

## 2023-07-29 NOTE — Assessment & Plan Note (Signed)
 On admission.  Continue antihypertensive therapy.  07-30-2023 pt states she takes no meds at home. Only smokes marijuana.

## 2023-07-29 NOTE — Progress Notes (Addendum)
 PROGRESS NOTE   HPI was taken from Dr. Achilles Holes: Rachael Gray is a 43 y.o. F American female with medical history significant for type 2 diabetes mellitus, lupus, seizures and pseudoseizures as well as collagen vascular disease, presented to the emergency room with acute onset of suspected seizures with tonic-clonic activity that resolved after 2.5 mg of IM Versed  after which she was given additional 2.5 mg due to agitation.  She admitted to hematemesis with bloody vomitus without melena or diarrhea however with generalized abdominal pain.  No fever or chills.  No cough or wheezing or dyspnea or hemoptysis.  No dysuria, oliguria or hematuria or flank pain.  No other bleeding diathesis.  She has been taking her medications on a regular basis.   ED Course: When the patient came to the ER, BP was 158/94 with heart rate of 129 and respiratory to 32.  Labs revealed a CO2 of 21 and glucose of 255 with anion gap of 17 with troponin of 8.6 and otherwise unremarkable CMP.  CBC showed no significant except is a 31.9 with neutrophilia.  Lactic acid was 2.1 and liver 0.7.  Urinalysis showed no anterior protein 1 and 500 glucose but was otherwise unremarkable.  Urine pregnancy test was negative.  Urine drug screen was positive for cannabinoids and benzodiazepine blood cultures were drawn. EKG as reviewed by me : EKG showed sinus tachycardia with rate of 140 with RSR' and borderline prolonged QT interval with QTc of 500 mg. Imaging: Abdominal/pelvic CT scan showed no acute abnormalities.   The patient was given 1 L bolus of acute urinary will be placed on IV Protonix .  She will be admitted to a program of unit bed for further evaluation and management.   KEIARA SYLVE  ZOX:[id number] DOB: [birth year] DOA: 07/28/2023 PCP: [facility]   Assessment & Plan:   Principal Problem:   Upper GI bleeding Active Problems:   Leukocytosis   Seizure disorder (HCC)   Uncontrolled type 2 diabetes mellitus with  hyperglycemia, with long-term current use of insulin  (HCC)   Essential hypertension   Diabetic gastroparesis (HCC)  Assessment and Plan: Upper GI bleeding: but H&H are WNL & trending up today. Continue on PPI. Hold EGD at present as per GI. GI recs apprec. D/c IVFs.   Seizure disorder: w/ hx of pseudoseizures as per HPI. Needs to psych outpatient but pt does not insurance currently as per pt. EEG ordered. IV ativan  prn.  Neuro consulted.   Leukocytosis: likely reactive. Trending down. D/c abxs and monitor   DM2: poorly controlled. Continue on SSI w/ accuchecks   Diabetic gastroparesis: continue on reglan     HTN: continue on metoprolol           DVT prophylaxis:  lovenox   Code Status: full  Family Communication:  Disposition Plan: likely d/c back home   Level of care: Progressive  Status is: Inpatient Remains inpatient appropriate because: EEG ordered, neuro needs to see    Consultants:  Neuro (Dr. Doretta Gant) GI  Procedures:  Antimicrobials:   Subjective: Pt c/o anxiety   Objective: Vitals:   07/29/23 0730 07/29/23 0810 07/29/23 0910 07/29/23 0911  BP:    (!) 162/80  Pulse: 92 98  (!) 103  Resp: 19 (!) 21    Temp:   97.9 F (36.6 C)   TempSrc:   Oral   SpO2: 100% 100%    Weight:      Height:       No intake or output data in the  24 hours ending 07/29/23 0925 Filed Weights   07/28/23 1921  Weight: 72.6 kg    Examination:  General exam: Appears anxious. Disheveled  Respiratory system: Clear to auscultation. Respiratory effort normal. Cardiovascular system: S1 & S2+. No rubs, gallops or clicks.  Gastrointestinal system: Abdomen is nondistended, soft and nontender. Normal bowel sounds heard. Central nervous system: Alert and oriented. Moves all extremities Psychiatry: Judgement and insight appears at baseline. Anxious mood and affect    Data Reviewed: I have personally reviewed following labs and imaging studies  CBC: Recent Labs  Lab  07/28/23 1926 07/29/23 0427  WBC 31.9* 25.8*  NEUTROABS 17.7*  --   HGB 13.3 14.3  HCT 39.2 43.9  MCV 85.4 87.1  PLT 240 220   Basic Metabolic Panel: Recent Labs  Lab 07/28/23 1926 07/29/23 0427  NA 136 137  K 3.6 3.4*  CL 98 101  CO2 21* 20*  GLUCOSE 255* 236*  BUN 16 14  CREATININE 0.63 0.40*  CALCIUM  9.8 9.4   GFR: Estimated Creatinine Clearance: 92.3 mL/min (A) (by C-G formula based on SCr of 0.4 mg/dL (L)). Liver Function Tests: Recent Labs  Lab 07/28/23 1926  AST 22  ALT 11  ALKPHOS 46  BILITOT 1.0  PROT 8.6*  ALBUMIN 4.9   Recent Labs  Lab 07/28/23 1926  LIPASE 24   No results for input(s): "AMMONIA" in the last 168 hours. Coagulation Profile: Recent Labs  Lab 07/28/23 1926  INR 1.1   Cardiac Enzymes: No results for input(s): "CKTOTAL", "CKMB", "CKMBINDEX", "TROPONINI" in the last 168 hours. BNP (last 3 results) No results for input(s): "PROBNP" in the last 8760 hours. HbA1C: No results for input(s): "HGBA1C" in the last 72 hours. CBG: Recent Labs  Lab 07/29/23 0810  GLUCAP 235*   Lipid Profile: No results for input(s): "CHOL", "HDL", "LDLCALC", "TRIG", "CHOLHDL", "LDLDIRECT" in the last 72 hours. Thyroid  Function Tests: No results for input(s): "TSH", "T4TOTAL", "FREET4", "T3FREE", "THYROIDAB" in the last 72 hours. Anemia Panel: No results for input(s): "VITAMINB12", "FOLATE", "FERRITIN", "TIBC", "IRON", "RETICCTPCT" in the last 72 hours. Sepsis Labs: Recent Labs  Lab 07/29/23 0059 07/29/23 0427  LATICACIDVEN 2.1* 1.7    Recent Results (from the past 240 hours)  Blood Culture (routine x 2)     Status: None (Preliminary result)   Collection Time: 07/29/23  1:28 AM   Specimen: BLOOD LEFT ARM  Result Value Ref Range Status   Specimen Description BLOOD LEFT ARM  Final   Special Requests   Final    BOTTLES DRAWN AEROBIC AND ANAEROBIC Blood Culture results may not be optimal due to an inadequate volume of blood received in culture  bottles   Culture   Final    NO GROWTH < 12 HOURS Performed at [facility], [address], [place], [place] [address]    Report Status PENDING  Incomplete  Blood Culture (routine x 2)     Status: None (Preliminary result)   Collection Time: 07/29/23  1:28 AM   Specimen: BLOOD  Result Value Ref Range Status   Specimen Description BLOOD LEFT FA  Final   Special Requests   Final    BOTTLES DRAWN AEROBIC AND ANAEROBIC Blood Culture results may not be optimal due to an inadequate volume of blood received in culture bottles   Culture   Final    NO GROWTH < 12 HOURS Performed at [facility], [address] [place], [place] [address]    Report Status PENDING  Incomplete         Radiology Studies: CT ABDOMEN PELVIS W CONTRAST Result Date: 07/29/2023 CLINICAL DATA:  Abdominal pain EXAM: CT ABDOMEN AND PELVIS WITH CONTRAST TECHNIQUE: Multidetector CT imaging of the abdomen and pelvis was performed using the standard protocol following bolus administration of intravenous contrast. RADIATION DOSE REDUCTION: This exam was performed according to the departmental dose-optimization program which includes automated exposure control, adjustment of the mA and/or kV according to patient size and/or use of iterative reconstruction technique. CONTRAST:  OMNIPAQUE  IOHEXOL  300 MG/ML  SOLN COMPARISON:  06/04/2022 FINDINGS: Lower chest: No acute abnormality Hepatobiliary: No focal liver abnormality is seen. Status post cholecystectomy. No biliary dilatation. Pancreas: No focal abnormality or ductal dilatation. Spleen: No focal abnormality.  Normal size. Adrenals/Urinary Tract: Small left adrenal nodule is stable since prior study and was low-density on prior noncontrast CT compatible with adenoma. Right adrenal gland and kidneys unremarkable. No stones or hydronephrosis. Urinary bladder unremarkable. Stomach/Bowel: Stomach, large and small bowel grossly unremarkable. Vascular/Lymphatic:  Aortic atherosclerosis. No evidence of aneurysm or adenopathy. Reproductive: Uterus and adnexa unremarkable.  No mass. Other: No free fluid or free air. Musculoskeletal: No acute bony abnormality. IMPRESSION: No acute findings in the abdomen or pelvis. Electronically Signed   By: Janeece Mechanic M.D.   On: 07/29/2023 00:16        Scheduled Meds:  enoxaparin  (LOVENOX ) injection  40 mg Subcutaneous Q24H   insulin  aspart  0-20 Units Subcutaneous Q4H   metoCLOPramide   5 mg Oral TID AC & HS   metoprolol  tartrate  12.5 mg Oral BID   pantoprazole  (PROTONIX ) IV  40 mg Intravenous Q12H   Continuous Infusions:  sodium chloride  100 mL/hr at 07/29/23 0251   cefTRIAXone  (ROCEPHIN )  IV       LOS: 0 days        Alphonsus Jeans, MD [facility] Pager [phone]  If 7PM-7AM, please contact night-coverage [email or web address] 07/29/2023, 9:25 AM

## 2023-07-29 NOTE — Assessment & Plan Note (Signed)
 On admission. Will place her on as needed IV Ativan .  Neurology consult to be obtained. I notified Dr. Doretta Gant about the patient.  Will obtain an EEG.  07-30-2023 EEG negative for any seizures. Pt had no seizure activity on her EEG despite pt's shaking, tremors and unresponsiveness. 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 Pt will be referred to outpatient psych. Will stop all AEDs. Pt states she takes no meds at home.

## 2023-07-29 NOTE — H&P (Addendum)
 [place]   PATIENT NAME: Rachael Gray    MR#:  [id number]  DATE OF BIRTH:  [birth year]  DATE OF ADMISSION:  07/28/2023  PRIMARY CARE PHYSICIAN: [facility]   Patient is coming from: Home  REQUESTING/REFERRING PHYSICIAN: Buell Carmin, MD  CHIEF COMPLAINT:   Chief Complaint  Patient presents with   Seizures    HISTORY OF PRESENT ILLNESS:  Rachael Gray is a 43 y.o. F American female with medical history significant for type 2 diabetes mellitus, lupus, seizures and pseudoseizures as well as collagen vascular disease, presented to the emergency room with acute onset of suspected seizures with tonic-clonic activity that resolved after 2.5 mg of IM Versed  after which she was given additional 2.5 mg due to agitation.  She admitted to hematemesis with bloody vomitus without melena or diarrhea however with generalized abdominal pain.  No fever or chills.  No cough or wheezing or dyspnea or hemoptysis.  No dysuria, oliguria or hematuria or flank pain.  No other bleeding diathesis.  She has been taking her medications on a regular basis.  ED Course: When the patient came to the ER, BP was 158/94 with heart rate of 129 and respiratory to 32.  Labs revealed a CO2 of 21 and glucose of 255 with anion gap of 17 with troponin of 8.6 and otherwise unremarkable CMP.  CBC showed no significant except is a 31.9 with neutrophilia.  Lactic acid was 2.1 and liver 0.7.  Urinalysis showed no anterior protein 1 and 500 glucose but was otherwise unremarkable.  Urine pregnancy test was negative.  Urine drug screen was positive for cannabinoids and benzodiazepine blood cultures were drawn. EKG as reviewed by me : EKG showed sinus tachycardia with rate of 140 with RSR' and borderline prolonged QT interval with QTc of 500 mg. Imaging: Abdominal/pelvic CT scan showed no acute abnormalities.  The patient was given 1 L bolus of acute urinary will be placed on IV Protonix .  She will be admitted to a  program of unit bed for further evaluation and management. PAST MEDICAL HISTORY:   Past Medical History:  Diagnosis Date   Collagen vascular disease (HCC)    Diabetes mellitus without complication (HCC)    Gastroparesis    Lupus    Pseudoseizures    Seizures (HCC)     PAST SURGICAL HISTORY:   Past Surgical History:  Procedure Laterality Date   APPENDECTOMY     CHOLECYSTECTOMY     ESOPHAGOGASTRODUODENOSCOPY N/A 06/28/2020   Procedure: ESOPHAGOGASTRODUODENOSCOPY (EGD);  Surgeon: Irby Mannan, MD;  Location: [facility] ENDOSCOPY;  Service: Endoscopy;  Laterality: N/A;    SOCIAL HISTORY:   Social History   Tobacco Use   Smoking status: Every Day    Current packs/day: 0.50    Average packs/day: 0.5 packs/day for 10.0 years (5.0 ttl pk-yrs)    Types: Cigarettes   Smokeless tobacco: Never  Substance Use Topics   Alcohol use: No    FAMILY HISTORY:   Family History  Problem Relation Age of Onset   Breast cancer Maternal Grandmother     DRUG ALLERGIES:   Allergies  Allergen Reactions   Dilantin  [Phenytoin  Sodium Extended] Hives   Doxycycline Swelling   Other    Tomato Rash   Tylenol  [Acetaminophen ] Rash    REVIEW OF SYSTEMS:   ROS As per history of present illness. All pertinent systems were reviewed above. Constitutional, HEENT, cardiovascular, respiratory, GI, GU, musculoskeletal, neuro, psychiatric, endocrine, integumentary and hematologic systems were  reviewed and are otherwise negative/unremarkable except for positive findings mentioned above in the HPI.   MEDICATIONS AT HOME:   Prior to Admission medications   Medication Sig Start Date End Date Taking? Authorizing Provider  insulin  aspart (NOVOLOG ) 100 UNIT/ML injection Inject 2 Units into the skin 3 (three) times daily with meals. 06/06/22   Read Camel, MD  insulin  glargine-yfgn (SEMGLEE ) 100 UNIT/ML injection Inject 0.1 mLs (10 Units total) into the skin daily. 06/07/22   Read Camel, MD   Insulin  Pen Needle 29G X 12.7MM MISC 10 Units by Does not apply route daily. 06/06/22   Agbata, Gregary Lean, MD  metoCLOPramide  (REGLAN ) 5 MG tablet Take 1 tablet (5 mg total) by mouth 4 (four) times daily -  before meals and at bedtime for 5 days. 06/06/22 06/11/22  Read Camel, MD  metoprolol  tartrate (LOPRESSOR ) 25 MG tablet Take 0.5 tablets (12.5 mg total) by mouth 2 (two) times daily. 06/06/22   Agbata, Tochukwu, MD      VITAL SIGNS:  Blood pressure (!) 169/81, pulse (!) 118, temperature 98.1 F (36.7 C), temperature source Oral, resp. rate (!) 22, height 5\' 5"  (1.651 m), weight 72.6 kg, SpO2 99%.  PHYSICAL EXAMINATION:  Physical Exam  GENERAL:  43 y.o.-year-old African-American female patient lying in the bed with no acute distress.  EYES: Pupils equal, round, reactive to light and accommodation. No scleral icterus. Extraocular muscles intact.  HEENT: Head atraumatic, normocephalic. Oropharynx and nasopharynx clear.  NECK:  Supple, no jugular venous distention. No thyroid  enlargement, no tenderness.  LUNGS: Normal breath sounds bilaterally, no wheezing, rales,rhonchi or crepitation. No use of accessory muscles of respiration.  CARDIOVASCULAR: Regular rate and rhythm, S1, S2 normal. No murmurs, rubs, or gallops.  ABDOMEN: Soft, nondistended, nontender. Bowel sounds present. No organomegaly or mass.  EXTREMITIES: No pedal edema, cyanosis, or clubbing.  NEUROLOGIC: Cranial nerves II through XII are intact. Muscle strength 5/5 in all extremities. Sensation intact. Gait not checked.  PSYCHIATRIC: The patient is alert and oriented x 3.  Normal affect and good eye contact. SKIN: No obvious rash, lesion, or ulcer.   LABORATORY PANEL:   CBC Recent Labs  Lab 07/29/23 0427  WBC 25.8*  HGB 14.3  HCT 43.9  PLT 220   ------------------------------------------------------------------------------------------------------------------  Chemistries  Recent Labs  Lab 07/28/23 1926  07/29/23 0427  NA 136 137  K 3.6 3.4*  CL 98 101  CO2 21* 20*  GLUCOSE 255* 236*  BUN 16 14  CREATININE 0.63 0.40*  CALCIUM  9.8 9.4  AST 22  --   ALT 11  --   ALKPHOS 46  --   BILITOT 1.0  --    ------------------------------------------------------------------------------------------------------------------  Cardiac Enzymes No results for input(s): "TROPONINI" in the last 168 hours. ------------------------------------------------------------------------------------------------------------------  RADIOLOGY:  CT ABDOMEN PELVIS W CONTRAST Result Date: 07/29/2023 CLINICAL DATA:  Abdominal pain EXAM: CT ABDOMEN AND PELVIS WITH CONTRAST TECHNIQUE: Multidetector CT imaging of the abdomen and pelvis was performed using the standard protocol following bolus administration of intravenous contrast. RADIATION DOSE REDUCTION: This exam was performed according to the departmental dose-optimization program which includes automated exposure control, adjustment of the mA and/or kV according to patient size and/or use of iterative reconstruction technique. CONTRAST:  OMNIPAQUE  IOHEXOL  300 MG/ML  SOLN COMPARISON:  06/04/2022 FINDINGS: Lower chest: No acute abnormality Hepatobiliary: No focal liver abnormality is seen. Status post cholecystectomy. No biliary dilatation. Pancreas: No focal abnormality or ductal dilatation. Spleen: No focal abnormality.  Normal size. Adrenals/Urinary Tract: Small left adrenal  nodule is stable since prior study and was low-density on prior noncontrast CT compatible with adenoma. Right adrenal gland and kidneys unremarkable. No stones or hydronephrosis. Urinary bladder unremarkable. Stomach/Bowel: Stomach, large and small bowel grossly unremarkable. Vascular/Lymphatic: Aortic atherosclerosis. No evidence of aneurysm or adenopathy. Reproductive: Uterus and adnexa unremarkable.  No mass. Other: No free fluid or free air. Musculoskeletal: No acute bony abnormality. IMPRESSION: No  acute findings in the abdomen or pelvis. Electronically Signed   By: Janeece Mechanic M.D.   On: 07/29/2023 00:16      IMPRESSION AND PLAN:  Assessment and Plan: * Upper GI bleeding - The patient was admitted to a progressive unit bed. - Will continue hydration with IV normal saline. - Will place the patient on IV Protonix . - GI consult to be obtained. - I notified Dr. Mamie Searles about the patient. - Will follow serial hemoglobins and hematocrits.   Seizure disorder (HCC) - Will place her on as needed IV Ativan . - Neurology consult to be obtained. - I notified Dr. Doretta Gant about the patient. - Will obtain an EEG.  Leukocytosis - This is likely stress demargination. - No clear infectious etiology at this time. - Will empirically place her on IV Rocephin  in the setting of her GI bleeding.  Uncontrolled type 2 diabetes mellitus with hyperglycemia, with long-term current use of insulin  (HCC) - The patient will be placed on frequent feedings of glucose measures with subsequent coverage with NovoLog  to avoid developing DKA. - Will continue basal coverage.  Diabetic gastroparesis (HCC) Will continue Reglan .  Essential hypertension - Continue antihypertensive therapy.       DVT prophylaxis: Lovenox .  Advanced Care Planning:  Code Status: full code.  Family Communication:  The plan of care was discussed in details with the patient (and family). I answered all questions. The patient agreed to proceed with the above mentioned plan. Further management will depend upon hospital course. Disposition Plan: Back to previous home environment Consults called: GI and neurology All the records are reviewed and case discussed with ED provider.  Status is: Inpatient    At the time of the admission, it appears that the appropriate admission status for this patient is inpatient.  This is judged to be reasonable and necessary in order to provide the required intensity of service to ensure the  patient's safety given the presenting symptoms, physical exam findings and initial radiographic and laboratory data in the context of comorbid conditions.  The patient requires inpatient status due to high intensity of service, high risk of further deterioration and high frequency of surveillance required.  I certify that at the time of admission, it is my clinical judgment that the patient will require inpatient hospital care extending more than 2 midnights.                            Dispo: The patient is from: Home              Anticipated d/c is to: Home              Patient currently is not medically stable to d/c.              Difficult to place patient: No  Virgene Griffin M.D on 07/29/2023 at 7:06 AM  [facility]   From 7 PM-7 AM, contact night-coverage [email or web address]  CC: Primary care physician; [facility]

## 2023-07-29 NOTE — Assessment & Plan Note (Signed)
 On admission. The patient will be placed on frequent feedings of glucose measures with subsequent coverage with NovoLog  to avoid developing DKA. - Will continue basal coverage.  07-30-2023 stable

## 2023-07-29 NOTE — ED Notes (Signed)
 Lab has been consulted to draw lactic and cultures

## 2023-07-29 NOTE — ED Notes (Addendum)
 Lab at bedside

## 2023-07-29 NOTE — ED Notes (Signed)
 This RN received report from Steffi Edu RN and performed care handoff. This RN introduced self to pt. Call light in reach, bed wheels locked, side rails raised, pt updated on plan of care. High fall risk precautions in place. Rounding completed.

## 2023-07-29 NOTE — ED Notes (Signed)
Received report on pt.

## 2023-07-29 NOTE — ED Notes (Signed)
 Pt sinus tach on monitor. Pt pumping arms in bed and cannot sit still. PRN ativan  will be given.

## 2023-07-29 NOTE — ED Notes (Signed)
 Pt continues to rock back and forth. Per hospitalist, ok to give PRN ativan  for this indication.

## 2023-07-29 NOTE — ED Notes (Signed)
 3 Nurses, and 2 lab technicians have been unable to acquire blood cultures from patient

## 2023-07-29 NOTE — Care Plan (Signed)
 Agree with note as documented by NP [facility]. Patient denies any hematemesis and states she has not had a bowel movement in 1-2 weeks. Patient is behaving somewhat strangely. Given normal hemoglobin will not plan any invasive procedure at this point. Can continue PPI and monitor for overt GI bleeding.  Rachael Bevel MD, MPH [facility] GI

## 2023-07-29 NOTE — Progress Notes (Signed)
 Eeg done

## 2023-07-29 NOTE — H&P (Signed)
 Came came  Consultation  Referring Provider:   Dr Achilles Holes   Admit date 07/29/23 Consult date    07/29/23     Reason for Consultation:     Hematemesis         HPI:   Rachael Gray is a 43 y.o. female  medical history significant for type 2 diabetes mellitus, gastroparesis, lupus, seizures and pseudoseizures as well as collagen vascular disease- she presented for seizure exacerbation which responded to versed . At some point there was concern for hematemesis. At this time patient states she is here due to her seizures. She is a difficult historian- she is somewhat drowsy but is sometimes inappropriate with verbalizations ( ie, "I'm a girl" when asked about her symptoms) and had some provocative gesturing which she stopped when asked.  She does say she sometimes has periumbilical abdominal pain and NV but is vague when questioned and does not often answer directly. She denies etoh but does endorse smoking 3 packs of cigarettes/d and at least 4 marijuana cigarettes/d. Denies cocaine and other illicits.  She denies any nsaids. States she is not taking any acid reducing or other gi meds at home. She was on both reglan  and PPI at one time in past. She has not been following with GI as an outpatient - last seen by Dr Cozetta Divers 06/30/20 while hospitalized- to continue bid ppi and she was starting to evaulate some elevation in transaminases. Liver enzymes normal on this admission. Lipase normal. No anemia however she has some significant leukocytosis. Platelets normal. Pt/inr normal. Lactic acid elevated. Ua + glucose/protein/microscopic hematuria. Had CT A/P yesterday without any acute findings.  PREVIOUS ENDOSCOPIES:            EGD 4.6/22- Dr Tully Gainer- La Grade C esophagitis, gastritis, normal duodenum. There was no h pylori/dysplasia/malignancy EGD 02/2013- Dr Peg Bouton- normal esophagus, gastritis normal duodenum EGD 1/14- Dr Peg Bouton- normal esophagus, bilious gastritis with some evidence of prior (not current  ) bleeding  Past Medical History:  Diagnosis Date   Collagen vascular disease (HCC)    Diabetes mellitus without complication (HCC)    Gastroparesis    Lupus    Pseudoseizures    Seizures (HCC)     Past Surgical History:  Procedure Laterality Date   APPENDECTOMY     CHOLECYSTECTOMY     ESOPHAGOGASTRODUODENOSCOPY N/A 06/28/2020   Procedure: ESOPHAGOGASTRODUODENOSCOPY (EGD);  Surgeon: Irby Mannan, MD;  Location: [facility] ENDOSCOPY;  Service: Endoscopy;  Laterality: N/A;  Laparotomy/right oopherectomy  Family History  Problem Relation Age of Onset   Breast cancer Maternal Grandmother      Social History   Tobacco Use   Smoking status: Every Day    Current packs/day: 0.50    Average packs/day: 0.5 packs/day for 10.0 years (5.0 ttl pk-yrs)    Types: Cigarettes   Smokeless tobacco: Never  Vaping Use   Vaping status: Never Used  Substance Use Topics   Alcohol use: No   Drug use: Yes    Types: Marijuana    Comment: yesterday    Prior to Admission medications   Medication Sig Start Date End Date Taking? Authorizing Provider  insulin  aspart (NOVOLOG ) 100 UNIT/ML injection Inject 2 Units into the skin 3 (three) times daily with meals. Patient not taking: Reported on 07/29/2023 06/06/22   Read Camel, MD  insulin  glargine-yfgn (SEMGLEE ) 100 UNIT/ML injection Inject 0.1 mLs (10 Units total) into the skin daily. Patient not taking: Reported on 07/29/2023 06/07/22   Read Camel, MD  Insulin   Pen Needle 29G X 12.7MM MISC 10 Units by Does not apply route daily. Patient not taking: Reported on 07/29/2023 06/06/22   Read Camel, MD  metoCLOPramide  (REGLAN ) 5 MG tablet Take 1 tablet (5 mg total) by mouth 4 (four) times daily -  before meals and at bedtime for 5 days. Patient not taking: Reported on 07/29/2023 06/06/22 06/11/22  Read Camel, MD  metoprolol  tartrate (LOPRESSOR ) 25 MG tablet Take 0.5 tablets (12.5 mg total) by mouth 2 (two) times daily. Patient not taking:  Reported on 07/29/2023 06/06/22   Read Camel, MD    Current Facility-Administered Medications  Medication Dose Route Frequency Provider Last Rate Last Admin   acetaminophen  (TYLENOL ) tablet 650 mg  650 mg Oral Q6H PRN Mansy, Jan A, MD       Or   acetaminophen  (TYLENOL ) suppository 650 mg  650 mg Rectal Q6H PRN Mansy, Jan A, MD       enoxaparin  (LOVENOX ) injection 40 mg  40 mg Subcutaneous Q24H Mansy, Jan A, MD   40 mg at 07/29/23 0805   insulin  aspart (novoLOG ) injection 0-20 Units  0-20 Units Subcutaneous Q4H Mansy, Jan A, MD   7 Units at 07/29/23 7829   LORazepam  (ATIVAN ) injection 1 mg  1 mg Intravenous Q1H PRN Mansy, Jan A, MD   1 mg at 07/29/23 5621   magnesium  hydroxide (MILK OF MAGNESIA) suspension 30 mL  30 mL Oral Daily PRN Mansy, Jan A, MD       metoCLOPramide  (REGLAN ) tablet 5 mg  5 mg Oral TID AC & HS Mansy, Jan A, MD   5 mg at 07/29/23 3086   metoprolol  tartrate (LOPRESSOR ) tablet 12.5 mg  12.5 mg Oral BID Mansy, Jan A, MD   12.5 mg at 07/29/23 5784   pantoprazole  (PROTONIX ) injection 40 mg  40 mg Intravenous Q12H Mansy, Jan A, MD   40 mg at 07/29/23 6962   traZODone  (DESYREL ) tablet 25 mg  25 mg Oral QHS PRN Mansy, Anastasio Kaska, MD       Current Outpatient Medications  Medication Sig Dispense Refill   insulin  aspart (NOVOLOG ) 100 UNIT/ML injection Inject 2 Units into the skin 3 (three) times daily with meals. (Patient not taking: Reported on 07/29/2023) 10 mL 11   insulin  glargine-yfgn (SEMGLEE ) 100 UNIT/ML injection Inject 0.1 mLs (10 Units total) into the skin daily. (Patient not taking: Reported on 07/29/2023) 10 mL 11   Insulin  Pen Needle 29G X 12.7MM MISC 10 Units by Does not apply route daily. (Patient not taking: Reported on 07/29/2023) 30 each 0   metoCLOPramide  (REGLAN ) 5 MG tablet Take 1 tablet (5 mg total) by mouth 4 (four) times daily -  before meals and at bedtime for 5 days. (Patient not taking: Reported on 07/29/2023) 20 tablet 0   metoprolol  tartrate (LOPRESSOR ) 25 MG tablet  Take 0.5 tablets (12.5 mg total) by mouth 2 (two) times daily. (Patient not taking: Reported on 07/29/2023) 30 tablet 1    Allergies as of 07/28/2023 - Review Complete 07/28/2023  Allergen Reaction Noted   Dilantin  [phenytoin  sodium extended] Hives 11/29/2016   Doxycycline Swelling 11/04/2014   Other  03/01/2014   Tomato Rash 08/26/2020   Tylenol  [acetaminophen ] Rash 11/04/2014     Review of Systems:    All systems reviewed and negative except where noted in HPI.     Physical Exam:  Vital signs in last 24 hours: Temp:  [97.9 F (36.6 C)-98.2 F (36.8 C)] 97.9 F (36.6 C) (05/06 0910)  Pulse Rate:  [92-129] 111 (05/06 0915) Resp:  [13-32] 20 (05/06 0915) BP: (142-169)/(68-94) 161/74 (05/06 0915) SpO2:  [97 %-100 %] 97 % (05/06 0915) Weight:  [72.6 kg] 72.6 kg (05/05 1921)   General:   Drowsy woman  in NAD Head:  Normocephalic and atraumatic. Eyes:   No icterus.   Conjunctiva pink. Ears:  Normal auditory acuity. Mouth: Mucosa pink moist, no lesions. Neck:  Supple; no masses felt Lungs:  Respirations even and unlabored. Lungs clear to auscultation bilaterally.   No wheezes, crackles, or rhonchi.  Heart:  S1S2, RRR, no MRG. No edema. Abdomen:   Flat, soft, nondistended, nontender. Normal bowel sounds. No appreciable masses or hepatomegaly. No rebound signs or other peritoneal signs. Rectal:  Not performed.  Msk:  MAEW x4, No clubbing or cyanosis. Strength 5/5. Symmetrical without gross deformities. Neurologic:  Alert and  oriented x4;  Cranial nerves II-XII intact.  Skin:  Warm, dry, pink without significant lesions or rashes. Psych:  Drowsy, wakes easily. Some avoidance of questions and inappropriate statements/gestures  LAB RESULTS: Recent Labs    07/28/23 1926 07/29/23 0427  WBC 31.9* 25.8*  HGB 13.3 14.3  HCT 39.2 43.9  PLT 240 220   BMET Recent Labs    07/28/23 1926 07/29/23 0427  NA 136 137  K 3.6 3.4*  CL 98 101  CO2 21* 20*  GLUCOSE 255* 236*  BUN 16  14  CREATININE 0.63 0.40*  CALCIUM  9.8 9.4   LFT Recent Labs    07/28/23 1926  PROT 8.6*  ALBUMIN 4.9  AST 22  ALT 11  ALKPHOS 46  BILITOT 1.0   PT/INR Recent Labs    07/28/23 1926  LABPROT 14.3  INR 1.1    STUDIES: CT ABDOMEN PELVIS W CONTRAST Result Date: 07/29/2023 CLINICAL DATA:  Abdominal pain EXAM: CT ABDOMEN AND PELVIS WITH CONTRAST TECHNIQUE: Multidetector CT imaging of the abdomen and pelvis was performed using the standard protocol following bolus administration of intravenous contrast. RADIATION DOSE REDUCTION: This exam was performed according to the departmental dose-optimization program which includes automated exposure control, adjustment of the mA and/or kV according to patient size and/or use of iterative reconstruction technique. CONTRAST:  OMNIPAQUE  IOHEXOL  300 MG/ML  SOLN COMPARISON:  06/04/2022 FINDINGS: Lower chest: No acute abnormality Hepatobiliary: No focal liver abnormality is seen. Status post cholecystectomy. No biliary dilatation. Pancreas: No focal abnormality or ductal dilatation. Spleen: No focal abnormality.  Normal size. Adrenals/Urinary Tract: Small left adrenal nodule is stable since prior study and was low-density on prior noncontrast CT compatible with adenoma. Right adrenal gland and kidneys unremarkable. No stones or hydronephrosis. Urinary bladder unremarkable. Stomach/Bowel: Stomach, large and small bowel grossly unremarkable. Vascular/Lymphatic: Aortic atherosclerosis. No evidence of aneurysm or adenopathy. Reproductive: Uterus and adnexa unremarkable.  No mass. Other: No free fluid or free air. Musculoskeletal: No acute bony abnormality. IMPRESSION: No acute findings in the abdomen or pelvis. Electronically Signed   By: Janeece Mechanic M.D.   On: 07/29/2023 00:16       Impression / Plan:   Hematemesis- patient now denies- but may have have bit tongue during seizure which got her here- as chart seems to indicate a provider witnessed some  blood in vomitus? Her hemoglobin has remained normal since presentation to ED. BUN normal. No melena. Has history of gastroparesis and esophagitis- do agree with PPI and metoclopramide . Recommended avoiding cannabis as this can worsen her gi condition- do recommend tobacco cessation as well. Says sometimes she goes to  [facility] for counselling. Will hold egd at present but could reconsider as clinically appopriate Leukocytosis/elevated lactic acid- unclear- unsure this is gi related-would consider other etiologies - does her diabetes contribute- her adherence to care for this is also doubtful Abnormal behaviour/seizures/medical nonadherence- agree with neuro consult. - would also consider psych eval- patient needs some outpatient care-note it has been recommended for her in past but she has not followed through   Thank you very much for this consult. These services were provided by Corrinne Din, NP-C, in collaboration with Shane Darling, MD, with whom I have discussed this patient in full.   Corrinne Din, NP-C

## 2023-07-29 NOTE — ED Notes (Signed)
 Pt continues to undulate in bed. Placed BP cuff back on pt. Attending provider saw pt today and is aware.

## 2023-07-29 NOTE — ED Notes (Signed)
 Pt continues to sleep.

## 2023-07-29 NOTE — ED Notes (Signed)
 Pt is ST on monitor. Pt cannot sit still. Pumping arms and undulating in bed. Will give PRN ativan .

## 2023-07-29 NOTE — ED Notes (Signed)
 Unable to draw H&H. Lab called. Pt awake now, moving chest forward and backward rhythmically in bed.

## 2023-07-29 NOTE — Assessment & Plan Note (Addendum)
 On admission. Will place her on as needed IV Ativan .  Neurology consult to be obtained. I notified Dr. Doretta Gant about the patient.  Will obtain an EEG.  07-30-2023 EEG negative for any seizures. Pt had no seizure activity on her EEG despite pt's shaking, tremors and unresponsiveness. 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 Pt will be referred to outpatient psych. Will stop all AEDs. Pt states she takes no meds at home.

## 2023-07-29 NOTE — ED Notes (Signed)
 EEG tech at bedside.

## 2023-07-29 NOTE — ED Notes (Signed)
 Pt alert again. Pt has removed monitoring leads.

## 2023-07-29 NOTE — Assessment & Plan Note (Signed)
 Will continue Reglan .

## 2023-07-29 NOTE — ED Notes (Signed)
 Called CCMD to place patient on central monitoring

## 2023-07-29 NOTE — ED Notes (Signed)
 Pt was undulating nonstop but has now fallen asleep. Lights dimmed.

## 2023-07-29 NOTE — Assessment & Plan Note (Addendum)
 On admission. - This is likely stress demargination. - No clear infectious etiology at this time. - Will empirically place her on IV Rocephin  in the setting of her GI bleeding.  07-30-2023 no indication for ABX. Rocephin  stopped

## 2023-07-29 NOTE — ED Notes (Signed)
 Pt given apple juice and water. Pt resting in bed at this time with no other needs.

## 2023-07-29 NOTE — Assessment & Plan Note (Addendum)
 On admission. The patient was admitted to a progressive unit bed. - Will continue hydration with IV normal saline. - Will place the patient on IV Protonix . - GI consult to be obtained. - I notified Dr. Mamie Searles about the patient. - Will follow serial hemoglobins and hematocrits.  07-30-2023 GI has no plans for EGD/Colonscopy. Pt's HgB has been stable. No further reports of any GI bleeding.

## 2023-07-29 NOTE — ED Notes (Signed)
 Pt is shaking arms and legs constantly. Has been this way since arrival to surge and per prior RN report. Pt in no acute distress.

## 2023-07-30 DIAGNOSIS — I10 Essential (primary) hypertension: Secondary | ICD-10-CM | POA: Diagnosis not present

## 2023-07-30 DIAGNOSIS — K922 Gastrointestinal hemorrhage, unspecified: Secondary | ICD-10-CM | POA: Diagnosis not present

## 2023-07-30 DIAGNOSIS — R569 Unspecified convulsions: Secondary | ICD-10-CM

## 2023-07-30 DIAGNOSIS — F1220 Cannabis dependence, uncomplicated: Secondary | ICD-10-CM | POA: Diagnosis not present

## 2023-07-30 DIAGNOSIS — E876 Hypokalemia: Secondary | ICD-10-CM

## 2023-07-30 DIAGNOSIS — F445 Conversion disorder with seizures or convulsions: Secondary | ICD-10-CM

## 2023-07-30 LAB — BASIC METABOLIC PANEL WITH GFR
Anion gap: 15 (ref 5–15)
BUN: 14 mg/dL (ref 6–20)
CO2: 22 mmol/L (ref 22–32)
Calcium: 9.3 mg/dL (ref 8.9–10.3)
Chloride: 101 mmol/L (ref 98–111)
Creatinine, Ser: 0.68 mg/dL (ref 0.44–1.00)
GFR, Estimated: >60 mL/min (ref >60)
Glucose, Bld: 117 mg/dL — ABNORMAL HIGH (ref 70–99)
Potassium: 3.1 mmol/L — ABNORMAL LOW (ref 3.5–5.1)
Sodium: 138 mmol/L (ref 135–145)

## 2023-07-30 LAB — CBG MONITORING, ED: Glucose-Capillary: 126 mg/dL — ABNORMAL HIGH (ref 70–99)

## 2023-07-30 LAB — CBC
HCT: 40.7 % (ref 36.0–46.0)
Hemoglobin: 13.6 g/dL (ref 12.0–15.0)
MCH: 28.8 pg (ref 26.0–34.0)
MCHC: 33.4 g/dL (ref 30.0–36.0)
MCV: 86.2 fL (ref 80.0–100.0)
Platelets: 249 10*3/uL (ref 150–400)
RBC: 4.72 MIL/uL (ref 3.87–5.11)
RDW: 13.4 % (ref 11.5–15.5)
WBC: 19.6 10*3/uL — ABNORMAL HIGH (ref 4.0–10.5)
nRBC: 0 % (ref 0.0–0.2)

## 2023-07-30 LAB — HIV ANTIBODY (ROUTINE TESTING W REFLEX): HIV Screen 4th Generation wRfx: NONREACTIVE

## 2023-07-30 LAB — GLUCOSE, CAPILLARY
Glucose-Capillary: 139 mg/dL — ABNORMAL HIGH (ref 70–99)
Glucose-Capillary: 103 mg/dL — ABNORMAL HIGH (ref 70–99)
Glucose-Capillary: 148 mg/dL — ABNORMAL HIGH (ref 70–99)

## 2023-07-30 MED ORDER — ORAL CARE MOUTH RINSE: 15.0000 mL | OROMUCOSAL | Status: DC | PRN

## 2023-07-30 MED ORDER — PEN NEEDLES 31G X 5 MM MISC: 1.0000 | Freq: Three times a day (TID) | 0 refills | Status: AC

## 2023-07-30 MED ORDER — POTASSIUM CHLORIDE 20 MEQ PO PACK
40.0000 meq | PACK | ORAL | Status: DC
Start: 2023-07-30 — End: 2023-07-30
  Administered 2023-07-30: 40 meq via ORAL
  Filled 2023-07-30: qty 2

## 2023-07-30 MED ORDER — LANCET DEVICE MISC: 1.0000 | Freq: Three times a day (TID) | 0 refills | Status: AC

## 2023-07-30 MED ORDER — PANTOPRAZOLE SODIUM 40 MG PO TBEC: 40.0000 mg | DELAYED_RELEASE_TABLET | Freq: Every day | ORAL | Status: DC

## 2023-07-30 MED ORDER — LANCETS MISC
1.0000 | Freq: Three times a day (TID) | 0 refills | Status: AC
Start: 2023-07-30 — End: ?

## 2023-07-30 MED ORDER — BLOOD GLUCOSE MONITORING SUPPL DEVI: 1.0000 | Freq: Three times a day (TID) | 0 refills | Status: AC

## 2023-07-30 MED ORDER — BLOOD GLUCOSE TEST VI STRP: 1.0000 | ORAL_STRIP | Freq: Three times a day (TID) | 0 refills | Status: AC

## 2023-07-30 MED ORDER — METOPROLOL TARTRATE 25 MG PO TABS: 12.5000 mg | ORAL_TABLET | Freq: Two times a day (BID) | ORAL | 0 refills | Status: AC

## 2023-07-30 NOTE — ED Notes (Signed)
Secretary called for pt transport.

## 2023-07-30 NOTE — Progress Notes (Signed)
 PROGRESS NOTE    Rachael Gray  WUJ:[id number] DOB: 1980-06-26 DOA: 07/28/2023 [facility]  Subjective: Patient seen and examined.  Patient was medically rocking back and forth on the bed.  This stopped when I asked her to be still in order to perform lung auscultation.  She states that her "shakes" which I determined to mean her constant rhythmic rocking back and forth in her bed has been chronic for a long time.  She states that she smokes marijuana on a daily basis.  She also smokes "hemp".  She denies any chronic opiate abuse.  Her urine drug screen is positive for benzodiazepines.  She was not given IV Ativan  until 07/29/2023.  Her UDS was positive for benzodiazepines on 07/28/2023.  She denies using benzodiazepines that she is bought off the street.  Neurologic consultation is pending.  Her EEG shows no change in her EEG despite her rhythmic shaking and "unresponsiveness".   Hospital Course: HPI: Rachael Gray is a 43 y.o. F American female with medical history significant for type 2 diabetes mellitus, lupus, seizures and pseudoseizures as well as collagen vascular disease, presented to the emergency room with acute onset of suspected seizures with tonic-clonic activity that resolved after 2.5 mg of IM Versed  after which she was given additional 2.5 mg due to agitation.  She admitted to hematemesis with bloody vomitus without melena or diarrhea however with generalized abdominal pain.  No fever or chills.  No cough or wheezing or dyspnea or hemoptysis.  No dysuria, oliguria or hematuria or flank pain.  No other bleeding diathesis.  She has been taking her medications on a regular basis.   ED Course: When the patient came to the ER, BP was 158/94 with heart rate of 129 and respiratory to 32.  Labs revealed a CO2 of 21 and glucose of 255 with anion gap of 17 with troponin of 8.6 and otherwise unremarkable CMP.  CBC showed no significant except is a 31.9 with neutrophilia.  Lactic  acid was 2.1 and liver 0.7.  Urinalysis showed no anterior protein 1 and 500 glucose but was otherwise unremarkable.  Urine pregnancy test was negative.  Urine drug screen was positive for cannabinoids and benzodiazepine blood cultures were drawn. EKG as reviewed by me : EKG showed sinus tachycardia with rate of 140 with RSR' and borderline prolonged QT interval with QTc of 500 mg. Imaging: Abdominal/pelvic CT scan showed no acute abnormalities.   The patient was given 1 L bolus of acute urinary will be placed on IV Protonix .  She will be admitted to a progressive unit bed for further evaluation and management.  Significant Events: Admitted 07/28/2023 for upper GI bleed   Significant Labs: Na 136, K 3.6, CO2 of 21, BUN 16, scr 0.63, glu 255 ETOH <10 Lipase 34 INR 1.1 WBC 31.9, HgB 13.3, plt 240 UDS +THC, +Benzos  Significant Imaging Studies: CT abd/pelvis No acute findings in the abdomen or pelvis.   Antibiotic Therapy: Anti-infectives (From admission, onward)    Start     Dose/Rate Route Frequency Ordered Stop   07/29/23 2300  cefTRIAXone  (ROCEPHIN ) 2 g in sodium chloride  0.9 % 100 mL IVPB  Status:  Discontinued        2 g 200 mL/hr over 30 Minutes Intravenous Every 24 hours 07/29/23 0658 07/29/23 0927   07/28/23 2245  cefTRIAXone  (ROCEPHIN ) 2 g in sodium chloride  0.9 % 100 mL IVPB        2 g 200 mL/hr over 30  Minutes Intravenous  Once 07/28/23 2244 07/29/23 0037   07/28/23 2245  metroNIDAZOLE  (FLAGYL ) IVPB 500 mg        500 mg 100 mL/hr over 60 Minutes Intravenous  Once 07/28/23 2244 07/29/23 0204       Procedures:   Consultants: GI    Assessment and Plan: * Upper GI bleeding On admission. The patient was admitted to a progressive unit bed. - Will continue hydration with IV normal saline. - Will place the patient on IV Protonix . - GI consult to be obtained. - I notified Dr. Mamie Searles about the patient. - Will follow serial hemoglobins and hematocrits.  07-30-2023 GI  has no plans for EGD/Colonscopy. Pt's HgB has been stable. No further reports of any GI bleeding.  Psychogenic nonepileptic seizure On admission. Will place her on as needed IV Ativan .  Neurology consult to be obtained. I notified Dr. Doretta Gant about the patient.  Will obtain an EEG.  07-30-2023 EEG negative for any seizures. Pt had no seizure activity on her EEG despite pt's shaking, tremors and unresponsiveness. 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 Pt will be referred to outpatient psych. Will stop all AEDs. Pt states she takes no meds at home.  Diabetic gastroparesis (HCC) 07-30-2023 stop reglan . It can cause rhythmic movement.s  Essential hypertension On admission.  Continue antihypertensive therapy.  07-30-2023 pt states she takes no meds at home. Only smokes marijuana.  Uncontrolled type 2 diabetes mellitus with hyperglycemia, with long-term current use of insulin  (HCC) On admission. The patient will be placed on frequent feedings of glucose measures with subsequent coverage with NovoLog  to avoid developing DKA. - Will continue basal coverage.  07-30-2023 stable  Leukocytosis On admission. - This is likely stress demargination. - No clear infectious etiology at this time. - Will empirically place her on IV Rocephin  in the setting of her GI bleeding.  07-30-2023 no indication for ABX. Rocephin  stopped  Cannabis use disorder, moderate, dependence (HCC) 07-30-2023 pt advised to stop smoking marijuana.   DVT prophylaxis: enoxaparin  (LOVENOX ) injection 40 mg Start: 07/29/23 0800    Code Status: Full Code Family Communication: no family at bedside Disposition Plan: return home Reason for continuing need for hospitalization: discussed with neurology. Ok for DC.  Objective: Vitals:   07/30/23 0812 07/30/23 0856 07/30/23 1015 07/30/23 1153  BP: (!) 158/99 (!) 158/99  (!) 144/107  Pulse: (!) 123 (!) 123  (!) 135  Resp: 18  18 20   Temp: 98.4 F (36.9 C)   97.8  F (36.6 C)  TempSrc: Oral   Oral  SpO2: 100%   100%  Weight:      Height:        Intake/Output Summary (Last 24 hours) at 07/30/2023 1328 Last data filed at 07/30/2023 1056 Gross per 24 hour  Intake 0 ml  Output 200 ml  Net -200 ml   Filed Weights   07/28/23 1921 07/30/23 0100  Weight: 72.6 kg 57.4 kg    Examination:  Physical Exam Vitals and nursing note reviewed.  Constitutional:      General: She is not in acute distress.    Appearance: She is not toxic-appearing.     Comments: Chronically ill appearing  HENT:     Head: Normocephalic and atraumatic.     Nose: Nose normal.  Eyes:     General: No scleral icterus. Cardiovascular:     Rate and Rhythm: Regular rhythm. Tachycardia present.  Pulmonary:     Effort: Pulmonary effort is normal.  Breath sounds: Normal breath sounds.  Abdominal:     General: Abdomen is flat. Bowel sounds are normal.     Palpations: Abdomen is soft.  Musculoskeletal:     Right lower leg: No edema.     Left lower leg: No edema.  Skin:    General: Skin is warm and dry.  Neurological:     Mental Status: She is alert.     Comments: Rhythmic rocking back and forth in bed. She would voluntarily stop her shaking when I asked her to so I could listen to her lungs. She shaking/tremors are completely voluntary.     Data Reviewed: I have personally reviewed following labs and imaging studies  CBC: Recent Labs  Lab 07/28/23 1926 07/29/23 0427 07/29/23 1324 07/30/23 0245  WBC 31.9* 25.8*  --  19.6*  NEUTROABS 17.7*  --   --   --   HGB 13.3 14.3 13.0 13.6  HCT 39.2 43.9 39.8 40.7  MCV 85.4 87.1  --  86.2  PLT 240 220  --  249   Basic Metabolic Panel: Recent Labs  Lab 07/28/23 1926 07/29/23 0427 07/30/23 0245  NA 136 137 138  K 3.6 3.4* 3.1*  CL 98 101 101  CO2 21* 20* 22  GLUCOSE 255* 236* 117*  BUN 16 14 14   CREATININE 0.63 0.40* 0.68  CALCIUM  9.8 9.4 9.3   GFR: Estimated Creatinine Clearance: 83.3 mL/min (by C-G formula  based on SCr of 0.68 mg/dL). Liver Function Tests: Recent Labs  Lab 07/28/23 1926  AST 22  ALT 11  ALKPHOS 46  BILITOT 1.0  PROT 8.6*  ALBUMIN 4.9   Recent Labs  Lab 07/28/23 1926  LIPASE 24    Coagulation Profile: Recent Labs  Lab 07/28/23 1926  INR 1.1   HbA1C: Recent Labs    07/29/23 0427  HGBA1C 9.4*   CBG: Recent Labs  Lab 07/29/23 2032 07/30/23 0048 07/30/23 0331 07/30/23 0811 07/30/23 1152  GLUCAP 143* 126* 139* 103* 148*   Sepsis Labs: Recent Labs  Lab 07/29/23 0059 07/29/23 0427  LATICACIDVEN 2.1* 1.7    Recent Results (from the past 240 hours)  Blood Culture (routine x 2)     Status: None (Preliminary result)   Collection Time: 07/29/23  1:28 AM   Specimen: BLOOD LEFT ARM  Result Value Ref Range Status   Specimen Description BLOOD LEFT ARM  Final   Special Requests   Final    BOTTLES DRAWN AEROBIC AND ANAEROBIC Blood Culture results may not be optimal due to an inadequate volume of blood received in culture bottles   Culture   Final    NO GROWTH 1 DAY Performed at [facility], [address], [place], [place] [address]    Report Status PENDING  Incomplete  Blood Culture (routine x 2)     Status: None (Preliminary result)   Collection Time: 07/29/23  1:28 AM   Specimen: BLOOD  Result Value Ref Range Status   Specimen Description BLOOD LEFT FA  Final   Special Requests   Final    BOTTLES DRAWN AEROBIC AND ANAEROBIC Blood Culture results may not be optimal due to an inadequate volume of blood received in culture bottles   Culture   Final    NO GROWTH 1 DAY Performed at [facility], [address] [place], [place] [address]    Report Status PENDING  Incomplete     Radiology Studies: EEG adult Result Date: 07/30/2023 Eleni Griffin, MD  07/30/2023  1:04 PM Routine EEG Report Laiken L Furches is a 43 y.o. female with a history of spells who is undergoing an EEG to evaluate for seizures. Report: This EEG was  acquired with electrodes placed according to the International 10-20 electrode system (including Fp1, Fp2, F3, F4, C3, C4, P3, P4, O1, O2, T3, T4, T5, T6, A1, A2, Fz, Cz, Pz). The following electrodes were missing or displaced: none. The occipital dominant rhythm was 8.5 Hz. This activity is reactive to stimulation. Drowsiness was manifested by background fragmentation; deeper stages of sleep were identified by K complexes and sleep spindles. There was no focal slowing. There were no interictal epileptiform discharges. There were no electrographic seizures identified. There was no abnormal response to photic stimulation or hyperventilation. Impression and clinical correlation: This EEG was obtained while awake and asleep and is normal. There were numerous types of her typical spells captured including unresponsiveness, whole body shaking, head shaking, and whole body shaking with head shaking as well.  In all types except the unresponsiveness she was able to follow commands and answer questions correctly during the events.  There was no change in the EEG before during or after these events and it was normal throughout.  These events are NOT epileptic. Greg Leaks, MD [facility] [phone] If 7pm- 7am, please page neurology on call as listed in AMION.   CT ABDOMEN PELVIS W CONTRAST Result Date: 07/29/2023 CLINICAL DATA:  Abdominal pain EXAM: CT ABDOMEN AND PELVIS WITH CONTRAST TECHNIQUE: Multidetector CT imaging of the abdomen and pelvis was performed using the standard protocol following bolus administration of intravenous contrast. RADIATION DOSE REDUCTION: This exam was performed according to the departmental dose-optimization program which includes automated exposure control, adjustment of the mA and/or kV according to patient size and/or use of iterative reconstruction technique. CONTRAST:  OMNIPAQUE  IOHEXOL  300 MG/ML  SOLN COMPARISON:  06/04/2022 FINDINGS: Lower chest: No acute  abnormality Hepatobiliary: No focal liver abnormality is seen. Status post cholecystectomy. No biliary dilatation. Pancreas: No focal abnormality or ductal dilatation. Spleen: No focal abnormality.  Normal size. Adrenals/Urinary Tract: Small left adrenal nodule is stable since prior study and was low-density on prior noncontrast CT compatible with adenoma. Right adrenal gland and kidneys unremarkable. No stones or hydronephrosis. Urinary bladder unremarkable. Stomach/Bowel: Stomach, large and small bowel grossly unremarkable. Vascular/Lymphatic: Aortic atherosclerosis. No evidence of aneurysm or adenopathy. Reproductive: Uterus and adnexa unremarkable.  No mass. Other: No free fluid or free air. Musculoskeletal: No acute bony abnormality. IMPRESSION: No acute findings in the abdomen or pelvis. Electronically Signed   By: Janeece Mechanic M.D.   On: 07/29/2023 00:16    Scheduled Meds:  enoxaparin  (LOVENOX ) injection  40 mg Subcutaneous Q24H   insulin  aspart  0-20 Units Subcutaneous Q4H   metoprolol  tartrate  12.5 mg Oral BID   pantoprazole   40 mg Oral Daily   potassium chloride   40 mEq Oral Q4H   Continuous Infusions:   LOS: 1 day   Time spent: 40 minutes  Unk Garb, DO  [facility]  07/30/2023, 1:28 PM

## 2023-07-30 NOTE — Procedures (Signed)
 Routine EEG Report  Rachael Gray is a 43 y.o. female with a history of spells who is undergoing an EEG to evaluate for seizures.  Report: This EEG was acquired with electrodes placed according to the International 10-20 electrode system (including Fp1, Fp2, F3, F4, C3, C4, P3, P4, O1, O2, T3, T4, T5, T6, A1, A2, Fz, Cz, Pz). The following electrodes were missing or displaced: none.  The occipital dominant rhythm was 8.5 Hz. This activity is reactive to stimulation. Drowsiness was manifested by background fragmentation; deeper stages of sleep were identified by K complexes and sleep spindles. There was no focal slowing. There were no interictal epileptiform discharges. There were no electrographic seizures identified. There was no abnormal response to photic stimulation or hyperventilation.   Impression and clinical correlation: This EEG was obtained while awake and asleep and is normal. There were numerous types of her typical spells captured including unresponsiveness, whole body shaking, head shaking, and whole body shaking with head shaking as well.  In all types except the unresponsiveness she was able to follow commands and answer questions correctly during the events.  There was no change in the EEG before during or after these events and it was normal throughout.  These events are NOT epileptic.  Greg Leaks, MD [facility] [phone]  If 7pm- 7am, please page neurology on call as listed in AMION.

## 2023-07-30 NOTE — TOC CM/SW Note (Signed)
 Transition of Care [facility]) - Inpatient Brief Assessment   Patient Details  Name: Rachael Gray MRN: [id number] Date of Birth: 1980-12-13  Transition of Care [facility]) CM/SW Contact:    Odilia Bennett, LCSW Phone Number: 07/30/2023, 1:45 PM   Clinical Narrative: Patient has orders to discharge home today. CSW reviewed chart. SDOH flag for social isolation. Added resources to AVS. No other TOC needs identified. CSW signing off.  Transition of Care Asessment: Insurance and Status: Insurance coverage has been reviewed Patient has primary care physician: Yes Home environment has been reviewed: Single family home Prior level of function:: Not documented Prior/Current Home Services: No current home services Social Drivers of Health Review: SDOH reviewed interventions complete Readmission risk has been reviewed: Yes Transition of care needs: no transition of care needs at this time

## 2023-07-30 NOTE — Subjective & Objective (Signed)
 Patient seen and examined.  Patient was medically rocking back and forth on the bed.  This stopped when I asked her to be still in order to perform lung auscultation.  She states that her "shakes" which I determined to mean her constant rhythmic rocking back and forth in her bed has been chronic for a long time.  She states that she smokes marijuana on a daily basis.  She also smokes "hemp".  She denies any chronic opiate abuse.  Her urine drug screen is positive for benzodiazepines.  She was not given IV Ativan  until 07/29/2023.  Her UDS was positive for benzodiazepines on 07/28/2023.  She denies using benzodiazepines that she is bought off the street.  Neurologic consultation is pending.  Her EEG shows no change in her EEG despite her rhythmic shaking and "unresponsiveness".

## 2023-07-30 NOTE — Discharge Summary (Signed)
 [facility] Physician Discharge Summary   Patient name: Rachael Gray  Admit date:     07/28/2023  Discharge date: 07/30/2023  Attending Physician: Virgene Griffin [[id number]]  Discharge Physician: Unk Garb   [facility]  Admitted From: Home  Disposition:  Home  Recommendations for Outpatient Follow-up:  Follow up with PCP in 1-2 weeks Outpatient psych referral made  Home Health:No Equipment/Devices: None  Discharge Condition:Stable CODE STATUS:FULL Diet recommendation: Diabetic Fluid Restriction: None  Hospital Summary: HPI: Rachael Gray is a 43 y.o. F American female with medical history significant for type 2 diabetes mellitus, lupus, seizures and pseudoseizures as well as collagen vascular disease, presented to the emergency room with acute onset of suspected seizures with tonic-clonic activity that resolved after 2.5 mg of IM Versed  after which she was given additional 2.5 mg due to agitation.  She admitted to hematemesis with bloody vomitus without melena or diarrhea however with generalized abdominal pain.  No fever or chills.  No cough or wheezing or dyspnea or hemoptysis.  No dysuria, oliguria or hematuria or flank pain.  No other bleeding diathesis.  She has been taking her medications on a regular basis.   ED Course: When the patient came to the ER, BP was 158/94 with heart rate of 129 and respiratory to 32.  Labs revealed a CO2 of 21 and glucose of 255 with anion gap of 17 with troponin of 8.6 and otherwise unremarkable CMP.  CBC showed no significant except is a 31.9 with neutrophilia.  Lactic acid was 2.1 and liver 0.7.  Urinalysis showed no anterior protein 1 and 500 glucose but was otherwise unremarkable.  Urine pregnancy test was negative.  Urine drug screen was positive for cannabinoids and benzodiazepine blood cultures were drawn. EKG as reviewed by me : EKG showed sinus tachycardia with rate of 140 with RSR' and borderline prolonged QT interval  with QTc of 500 mg. Imaging: Abdominal/pelvic CT scan showed no acute abnormalities.   The patient was given 1 L bolus of acute urinary will be placed on IV Protonix .  She will be admitted to a progressive unit bed for further evaluation and management.  Significant Events: Admitted 07/28/2023 for upper GI bleed   Significant Labs: Na 136, K 3.6, CO2 of 21, BUN 16, scr 0.63, glu 255 ETOH <10 Lipase 34 INR 1.1 WBC 31.9, HgB 13.3, plt 240 UDS +THC, +Benzos  Significant Imaging Studies: CT abd/pelvis No acute findings in the abdomen or pelvis.   Antibiotic Therapy: Anti-infectives (From admission, onward)    Start     Dose/Rate Route Frequency Ordered Stop   07/29/23 2300  cefTRIAXone  (ROCEPHIN ) 2 g in sodium chloride  0.9 % 100 mL IVPB  Status:  Discontinued        2 g 200 mL/hr over 30 Minutes Intravenous Every 24 hours 07/29/23 0658 07/29/23 0927   07/28/23 2245  cefTRIAXone  (ROCEPHIN ) 2 g in sodium chloride  0.9 % 100 mL IVPB        2 g 200 mL/hr over 30 Minutes Intravenous  Once 07/28/23 2244 07/29/23 0037   07/28/23 2245  metroNIDAZOLE  (FLAGYL ) IVPB 500 mg        500 mg 100 mL/hr over 60 Minutes Intravenous  Once 07/28/23 2244 07/29/23 0204       Procedures:   Consultants: GI   Hospital Course by Problem: * Upper GI bleeding On admission. The patient was admitted to a progressive unit bed. - Will continue hydration with IV normal saline. -  Will place the patient on IV Protonix . - GI consult to be obtained. - I notified Dr. Mamie Searles about the patient. - Will follow serial hemoglobins and hematocrits.  07-30-2023 GI has no plans for EGD/Colonscopy. Pt's HgB has been stable. No further reports of any GI bleeding.  Psychogenic nonepileptic seizure On admission. Will place her on as needed IV Ativan .  Neurology consult to be obtained. I notified Dr. Doretta Gant about the patient.  Will obtain an EEG.  07-30-2023 EEG negative for any seizures. Pt had no seizure activity on  her EEG despite pt's shaking, tremors and unresponsiveness. 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 Pt will be referred to outpatient psych. Will stop all AEDs. Pt states she takes no meds at home.  Diabetic gastroparesis (HCC) 07-30-2023 stop reglan . It can cause rhythmic movement.s  Essential hypertension On admission.  Continue antihypertensive therapy.  07-30-2023 pt states she takes no meds at home. Only smokes marijuana.  Uncontrolled type 2 diabetes mellitus with hyperglycemia, with long-term current use of insulin  (HCC) On admission. The patient will be placed on frequent feedings of glucose measures with subsequent coverage with NovoLog  to avoid developing DKA. - Will continue basal coverage.  07-30-2023 stable  Leukocytosis On admission. - This is likely stress demargination. - No clear infectious etiology at this time. - Will empirically place her on IV Rocephin  in the setting of her GI bleeding.  07-30-2023 no indication for ABX. Rocephin  stopped  Cannabis use disorder, moderate, dependence (HCC) 07-30-2023 pt advised to stop smoking marijuana.  Hypokalemia 07-30-2023 replete with oral KCl    Discharge Diagnoses:  Principal Problem:   Upper GI bleeding Active Problems:   Psychogenic nonepileptic seizure   Cannabis use disorder, moderate, dependence (HCC)   Leukocytosis   Uncontrolled type 2 diabetes mellitus with hyperglycemia, with long-term current use of insulin  (HCC)   Essential hypertension   Diabetic gastroparesis (HCC)   Hypokalemia   Discharge Instructions  Discharge Instructions     Ambulatory referral to Psychiatry   Complete by: As directed    Diet - low sodium heart healthy   Complete by: As directed    Discharge instructions   Complete by: As directed    1. Follow up with your primary care provider in 1-2 weeks following discharge from hospital.   Increase activity slowly   Complete by: As directed       Allergies as  of 07/30/2023       Reactions   Dilantin  [phenytoin  Sodium Extended] Hives   Doxycycline Swelling   Other    Tomato Rash   Tylenol  [acetaminophen ] Rash        Medication List     STOP taking these medications    insulin  aspart 100 UNIT/ML injection Commonly known as: novoLOG    insulin  glargine-yfgn 100 UNIT/ML injection Commonly known as: SEMGLEE    metoCLOPramide  5 MG tablet Commonly known as: Reglan        TAKE these medications    Blood Glucose Monitoring Suppl Devi 1 each by Does not apply route 3 (three) times daily. May dispense any manufacturer covered by patient's insurance.   BLOOD GLUCOSE TEST STRIPS Strp 1 each by Does not apply route 3 (three) times daily. Use as directed to check blood sugar. May dispense any manufacturer covered by patient's insurance and fits patient's device.   Lancet Device Misc 1 each by Does not apply route 3 (three) times daily. May dispense any manufacturer covered by patient's insurance.   Lancets Misc 1 each  by Does not apply route 3 (three) times daily. Use as directed to check blood sugar. May dispense any manufacturer covered by patient's insurance and fits patient's device.   metoprolol  tartrate 25 MG tablet Commonly known as: LOPRESSOR  Take 0.5 tablets (12.5 mg total) by mouth 2 (two) times daily.   Pen Needles 31G X 5 MM Misc 1 each by Does not apply route 3 (three) times daily. May dispense any manufacturer covered by patient's insurance. What changed:  medication strength how much to take when to take this additional instructions        Allergies  Allergen Reactions   Dilantin  [Phenytoin  Sodium Extended] Hives   Doxycycline Swelling   Other    Tomato Rash   Tylenol  [Acetaminophen ] Rash    Discharge Exam: Vitals:   07/30/23 1015 07/30/23 1153  BP:  (!) 144/107  Pulse:  (!) 135  Resp: 18 20  Temp:  97.8 F (36.6 C)  SpO2:  100%    Physical Exam Vitals and nursing note reviewed.  Constitutional:       General: She is not in acute distress.    Appearance: She is not toxic-appearing.     Comments: Chronically ill appearing  HENT:     Head: Normocephalic and atraumatic.     Nose: Nose normal.  Eyes:     General: No scleral icterus. Cardiovascular:     Rate and Rhythm: Regular rhythm. Tachycardia present.  Pulmonary:     Effort: Pulmonary effort is normal.     Breath sounds: Normal breath sounds.  Abdominal:     General: Abdomen is flat. Bowel sounds are normal.     Palpations: Abdomen is soft.  Musculoskeletal:     Right lower leg: No edema.     Left lower leg: No edema.  Skin:    General: Skin is warm and dry.  Neurological:     Mental Status: She is alert.     Comments: Rhythmic rocking back and forth in bed. She would voluntarily stop her shaking when I asked her to so I could listen to her lungs. She shaking/tremors are completely voluntary.     The results of significant diagnostics from this hospitalization (including imaging, microbiology, ancillary and laboratory) are listed below for reference.    Microbiology: Recent Results (from the past 240 hours)  Blood Culture (routine x 2)     Status: None (Preliminary result)   Collection Time: 07/29/23  1:28 AM   Specimen: BLOOD LEFT ARM  Result Value Ref Range Status   Specimen Description BLOOD LEFT ARM  Final   Special Requests   Final    BOTTLES DRAWN AEROBIC AND ANAEROBIC Blood Culture results may not be optimal due to an inadequate volume of blood received in culture bottles   Culture   Final    NO GROWTH 1 DAY Performed at [facility], [address], [place], [place] [address]    Report Status PENDING  Incomplete  Blood Culture (routine x 2)     Status: None (Preliminary result)   Collection Time: 07/29/23  1:28 AM   Specimen: BLOOD  Result Value Ref Range Status   Specimen Description BLOOD LEFT FA  Final   Special Requests   Final    BOTTLES DRAWN AEROBIC AND ANAEROBIC Blood Culture results  may not be optimal due to an inadequate volume of blood received in culture bottles   Culture   Final    NO GROWTH 1 DAY Performed at [facility], [address] [place], [place] [address]    Report Status PENDING  Incomplete     Labs:  Basic Metabolic Panel: Recent Labs  Lab 07/28/23 1926 07/29/23 0427 07/30/23 0245  NA 136 137 138  K 3.6 3.4* 3.1*  CL 98 101 101  CO2 21* 20* 22  GLUCOSE 255* 236* 117*  BUN 16 14 14   CREATININE 0.63 0.40* 0.68  CALCIUM  9.8 9.4 9.3   Liver Function Tests: Recent Labs  Lab 07/28/23 1926  AST 22  ALT 11  ALKPHOS 46  BILITOT 1.0  PROT 8.6*  ALBUMIN 4.9   Recent Labs  Lab 07/28/23 1926  LIPASE 24   CBC: Recent Labs  Lab 07/28/23 1926 07/29/23 0427 07/29/23 1324 07/30/23 0245  WBC 31.9* 25.8*  --  19.6*  NEUTROABS 17.7*  --   --   --   HGB 13.3 14.3 13.0 13.6  HCT 39.2 43.9 39.8 40.7  MCV 85.4 87.1  --  86.2  PLT 240 220  --  249   CBG: Recent Labs  Lab 07/29/23 2032 07/30/23 0048 07/30/23 0331 07/30/23 0811 07/30/23 1152  GLUCAP 143* 126* 139* 103* 148*   Hgb A1c Recent Labs    07/29/23 0427  HGBA1C 9.4*   Urinalysis    Component Value Date/Time   COLORURINE YELLOW (A) 07/28/2023 2224   APPEARANCEUR CLEAR (A) 07/28/2023 2224   PHURINE 5.0 07/28/2023 2224   GLUCOSEU >=500 (A) 07/28/2023 2224   HGBUR MODERATE (A) 07/28/2023 2224   BILIRUBINUR NEGATIVE 07/28/2023 2224   KETONESUR 20 (A) 07/28/2023 2224   PROTEINUR >=300 (A) 07/28/2023 2224   NITRITE NEGATIVE 07/28/2023 2224   LEUKOCYTESUR NEGATIVE 07/28/2023 2224   Sepsis Labs Recent Labs  Lab 07/28/23 1926 07/29/23 0427 07/30/23 0245  WBC 31.9* 25.8* 19.6*    Procedures/Studies: EEG adult Result Date: 07/30/2023 Eleni Griffin, MD     07/30/2023  1:04 PM Routine EEG Report Rachael Gray is a 43 y.o. female with a history of spells who is undergoing an EEG to evaluate for seizures. Report: This EEG was acquired with electrodes  placed according to the International 10-20 electrode system (including Fp1, Fp2, F3, F4, C3, C4, P3, P4, O1, O2, T3, T4, T5, T6, A1, A2, Fz, Cz, Pz). The following electrodes were missing or displaced: none. The occipital dominant rhythm was 8.5 Hz. This activity is reactive to stimulation. Drowsiness was manifested by background fragmentation; deeper stages of sleep were identified by K complexes and sleep spindles. There was no focal slowing. There were no interictal epileptiform discharges. There were no electrographic seizures identified. There was no abnormal response to photic stimulation or hyperventilation. Impression and clinical correlation: This EEG was obtained while awake and asleep and is normal. There were numerous types of her typical spells captured including unresponsiveness, whole body shaking, head shaking, and whole body shaking with head shaking as well.  In all types except the unresponsiveness she was able to follow commands and answer questions correctly during the events.  There was no change in the EEG before during or after these events and it was normal throughout.  These events are NOT epileptic. Greg Leaks, MD [facility] [phone] If 7pm- 7am, please page neurology on call as listed in AMION.   CT ABDOMEN PELVIS W CONTRAST Result Date: 07/29/2023 CLINICAL DATA:  Abdominal pain EXAM: CT ABDOMEN AND PELVIS WITH CONTRAST TECHNIQUE: Multidetector CT imaging of the abdomen and pelvis was performed using the standard protocol following bolus  administration of intravenous contrast. RADIATION DOSE REDUCTION: This exam was performed according to the departmental dose-optimization program which includes automated exposure control, adjustment of the mA and/or kV according to patient size and/or use of iterative reconstruction technique. CONTRAST:  OMNIPAQUE  IOHEXOL  300 MG/ML  SOLN COMPARISON:  06/04/2022 FINDINGS: Lower chest: No acute abnormality Hepatobiliary: No  focal liver abnormality is seen. Status post cholecystectomy. No biliary dilatation. Pancreas: No focal abnormality or ductal dilatation. Spleen: No focal abnormality.  Normal size. Adrenals/Urinary Tract: Small left adrenal nodule is stable since prior study and was low-density on prior noncontrast CT compatible with adenoma. Right adrenal gland and kidneys unremarkable. No stones or hydronephrosis. Urinary bladder unremarkable. Stomach/Bowel: Stomach, large and small bowel grossly unremarkable. Vascular/Lymphatic: Aortic atherosclerosis. No evidence of aneurysm or adenopathy. Reproductive: Uterus and adnexa unremarkable.  No mass. Other: No free fluid or free air. Musculoskeletal: No acute bony abnormality. IMPRESSION: No acute findings in the abdomen or pelvis. Electronically Signed   By: Janeece Mechanic M.D.   On: 07/29/2023 00:16    Time coordinating discharge: 55 mins  SIGNED:  Unk Garb, DO [facility] 07/30/23, 1:35 PM

## 2023-07-30 NOTE — Hospital Course (Signed)
 HPI: Rachael Gray is a 43 y.o. F American female with medical history significant for type 2 diabetes mellitus, lupus, seizures and pseudoseizures as well as collagen vascular disease, presented to the emergency room with acute onset of suspected seizures with tonic-clonic activity that resolved after 2.5 mg of IM Versed  after which she was given additional 2.5 mg due to agitation.  She admitted to hematemesis with bloody vomitus without melena or diarrhea however with generalized abdominal pain.  No fever or chills.  No cough or wheezing or dyspnea or hemoptysis.  No dysuria, oliguria or hematuria or flank pain.  No other bleeding diathesis.  She has been taking her medications on a regular basis.   ED Course: When the patient came to the ER, BP was 158/94 with heart rate of 129 and respiratory to 32.  Labs revealed a CO2 of 21 and glucose of 255 with anion gap of 17 with troponin of 8.6 and otherwise unremarkable CMP.  CBC showed no significant except is a 31.9 with neutrophilia.  Lactic acid was 2.1 and liver 0.7.  Urinalysis showed no anterior protein 1 and 500 glucose but was otherwise unremarkable.  Urine pregnancy test was negative.  Urine drug screen was positive for cannabinoids and benzodiazepine blood cultures were drawn. EKG as reviewed by me : EKG showed sinus tachycardia with rate of 140 with RSR' and borderline prolonged QT interval with QTc of 500 mg. Imaging: Abdominal/pelvic CT scan showed no acute abnormalities.   The patient was given 1 L bolus of acute urinary will be placed on IV Protonix .  She will be admitted to a progressive unit bed for further evaluation and management.  Significant Events: Admitted 07/28/2023 for upper GI bleed   Significant Labs: Na 136, K 3.6, CO2 of 21, BUN 16, scr 0.63, glu 255 ETOH <10 Lipase 34 INR 1.1 WBC 31.9, HgB 13.3, plt 240 UDS +THC, +Benzos  Significant Imaging Studies: CT abd/pelvis No acute findings in the abdomen or pelvis.    Antibiotic Therapy: Anti-infectives (From admission, onward)    Start     Dose/Rate Route Frequency Ordered Stop   07/29/23 2300  cefTRIAXone  (ROCEPHIN ) 2 g in sodium chloride  0.9 % 100 mL IVPB  Status:  Discontinued        2 g 200 mL/hr over 30 Minutes Intravenous Every 24 hours 07/29/23 0658 07/29/23 0927   07/28/23 2245  cefTRIAXone  (ROCEPHIN ) 2 g in sodium chloride  0.9 % 100 mL IVPB        2 g 200 mL/hr over 30 Minutes Intravenous  Once 07/28/23 2244 07/29/23 0037   07/28/23 2245  metroNIDAZOLE  (FLAGYL ) IVPB 500 mg        500 mg 100 mL/hr over 60 Minutes Intravenous  Once 07/28/23 2244 07/29/23 1610       Procedures:   Consultants: GI

## 2023-07-30 NOTE — Progress Notes (Signed)
   07/30/23 0812  Assess: MEWS Score  Temp 98.4 F (36.9 C)  BP (!) 158/99  MAP (mmHg) 111  Pulse Rate (!) 123  ECG Heart Rate (!) 122  Resp 18  Level of Consciousness Alert  SpO2 100 %  O2 Device Room Air  Assess: MEWS Score  MEWS Temp 0  MEWS Systolic 0  MEWS Pulse 2  MEWS RR 0  MEWS LOC 0  MEWS Score 2  MEWS Score Color Yellow  Assess: if the MEWS score is Yellow or Red  Were vital signs accurate and taken at a resting state? Yes  Does the patient meet 2 or more of the SIRS criteria? Yes  Does the patient have a confirmed or suspected source of infection? No  MEWS guidelines implemented  Yes, yellow  Treat  MEWS Interventions Considered administering scheduled or prn medications/treatments as ordered  Take Vital Signs  Increase Vital Sign Frequency  Yellow: Q2hr x1, continue Q4hrs until patient remains green for 12hrs  Escalate  MEWS: Escalate Yellow: Discuss with charge nurse and consider notifying provider and/or RRT  Notify: Charge Nurse/RN  Name of Charge Nurse/RN Notified LaShara, RN  Provider Notification  Provider Name/Title Dr. Unk Garb  Date Provider Notified 07/30/23  Time Provider Notified [phone]  Method of Notification Page  Notification Reason Other (Comment) (Yellow MEWS)  Provider response No new orders  Date of Provider Response 07/30/23  Time of Provider Response 0818  Assess: SIRS CRITERIA  SIRS Temperature  0  SIRS Respirations  0  SIRS Pulse 1  SIRS WBC 1  SIRS Score Sum  2   Notified Dr. Farrel Hones that patient's heart rate is elevated sinus tach in 120s and patient is now a yellow MEWS. MD acknowledged. No new orders given.

## 2023-07-30 NOTE — Discharge Instructions (Signed)

## 2023-07-30 NOTE — Progress Notes (Signed)
 Discharge instructions given to and reviewed with patient and her daughter. Both patient and daughter verbalized understanding of all discharge instructions including new prescriptions and follow up care. Patient left unit alert and no distress noted by wheelchair with Autry Legions, NT and daughter at side.

## 2023-07-30 NOTE — Assessment & Plan Note (Signed)
 07-30-2023 pt advised to stop smoking marijuana.

## 2023-07-30 NOTE — Assessment & Plan Note (Signed)
 07-30-2023 replete with oral KCl

## 2023-07-30 NOTE — Plan of Care (Signed)

## 2023-07-30 NOTE — Progress Notes (Addendum)
 Notified Dr. Farrel Hones that patient is very restless and agitated, RN concerned that patient could fall or have an outburst, heart rate 120-140 sinus tach. Attempting to get patient moved closer to the nursing station. MD acknowledged, no new orders give at this time.

## 2023-08-03 LAB — CULTURE, BLOOD (ROUTINE X 2)
Culture: NO GROWTH
Culture: NO GROWTH

## 2023-08-04 DIAGNOSIS — Z419 Encounter for procedure for purposes other than remedying health state, unspecified: Secondary | ICD-10-CM | POA: Diagnosis not present

## 2023-09-04 DIAGNOSIS — Z419 Encounter for procedure for purposes other than remedying health state, unspecified: Secondary | ICD-10-CM | POA: Diagnosis not present

## 2023-09-18 ENCOUNTER — Emergency Department

## 2023-09-18 ENCOUNTER — Other Ambulatory Visit: Payer: Self-pay

## 2023-09-18 ENCOUNTER — Emergency Department
Admission: EM | Admit: 2023-09-18 | Discharge: 2023-09-19 | Disposition: A | Discharge status: Home / Self Care | Attending: Emergency Medicine | Admitting: Emergency Medicine

## 2023-09-18 DIAGNOSIS — F445 Conversion disorder with seizures or convulsions: Secondary | ICD-10-CM | POA: Diagnosis not present

## 2023-09-18 DIAGNOSIS — R0789 Other chest pain: Secondary | ICD-10-CM | POA: Diagnosis not present

## 2023-09-18 DIAGNOSIS — R079 Chest pain, unspecified: Secondary | ICD-10-CM | POA: Diagnosis not present

## 2023-09-18 LAB — CBC
HCT: 42.2 % (ref 36.0–46.0)
Hemoglobin: 14 g/dL (ref 12.0–15.0)
MCH: 28.4 pg (ref 26.0–34.0)
MCHC: 33.2 g/dL (ref 30.0–36.0)
MCV: 85.6 fL (ref 80.0–100.0)
Platelets: 224 10*3/uL (ref 150–400)
RBC: 4.93 MIL/uL (ref 3.87–5.11)
RDW: 12.6 % (ref 11.5–15.5)
WBC: 12.1 10*3/uL — ABNORMAL HIGH (ref 4.0–10.5)
nRBC: 0 % (ref 0.0–0.2)

## 2023-09-18 LAB — TROPONIN I (HIGH SENSITIVITY): Troponin I (High Sensitivity): 4 ng/L (ref <18)

## 2023-09-18 LAB — BASIC METABOLIC PANEL WITH GFR
Anion gap: 17 — ABNORMAL HIGH (ref 5–15)
BUN: 12 mg/dL (ref 6–20)
CO2: 24 mmol/L (ref 22–32)
Calcium: 9.9 mg/dL (ref 8.9–10.3)
Chloride: 97 mmol/L — ABNORMAL LOW (ref 98–111)
Creatinine, Ser: 0.58 mg/dL (ref 0.44–1.00)
GFR, Estimated: >60 mL/min (ref >60)
Glucose, Bld: 259 mg/dL — ABNORMAL HIGH (ref 70–99)
Potassium: 3.4 mmol/L — ABNORMAL LOW (ref 3.5–5.1)
Sodium: 138 mmol/L (ref 135–145)

## 2023-09-18 LAB — CBG MONITORING, ED: Glucose-Capillary: 261 mg/dL — ABNORMAL HIGH (ref 70–99)

## 2023-09-18 MED ORDER — MIDAZOLAM HCL 2 MG/2ML IJ SOLN
1.0000 mg | Freq: Once | INTRAMUSCULAR | Status: AC
  Administered 2023-09-19: 1 mg via INTRAVENOUS
  Filled 2023-09-18: qty 2

## 2023-09-18 MED ORDER — LACTATED RINGERS IV BOLUS
1000.0000 mL | Freq: Once | INTRAVENOUS | Status: AC
  Administered 2023-09-19: 1000 mL via INTRAVENOUS

## 2023-09-18 NOTE — ED Provider Notes (Signed)
 [facility] Provider Note    Event Date/Time   First MD Initiated Contact with Patient 09/18/23 2256     (approximate)   History   Chest Pain   HPI Rachael Gray is a 43 y.o. female well-known to the emergency department for prior visits related to gastroparesis versus cannabinoid hyperemesis syndrome and pseudoseizures, and she also has a history of lupus, collagen vascular disease, and type 2 diabetes.  She presents tonight for chest pain and persistent repetitive movements.  She said that her shaking has been going on for 4 days.  Her adult daughter is with her at bedside.  The patient states that she does not know why they started 4 days ago.  She said that they get better sometimes but then they start back up.  She said that people have told her repeatedly that it is all in my head but she knows something is wrong.  She was in the hospital about 6 or 7 weeks ago for similar issues and they were not able to tell her why she was having symptoms and her EEG was normal.  She denies fever or shortness of breath.  She said that her chest is aching, possibly because of all of the moving around, although she is not sure if the chest pain was what caused her symptoms to flareup in the first place.  Her daughter said that over the last 6 or 7 years, this happens for all sorts of reasons, such as when the patient is on her period, when she has any sort of pain, if she gets upset, etc.  The patient has been told in the past that she needs to stop smoking marijuana but she confirmed that she continues to smoke.     Physical Exam   Triage Vital Signs: ED Triage Vitals  Encounter Vitals Group     BP 09/18/23 1938 (!) 153/96     Girls Systolic BP Percentile --      Girls Diastolic BP Percentile --      Boys Systolic BP Percentile --      Boys Diastolic BP Percentile --      Pulse Rate 09/18/23 1938 (!) 130     Resp 09/18/23 1938 (!) 32     Temp 09/18/23 1939  98.8 F (37.1 C)     Temp Source 09/18/23 1939 Oral     SpO2 09/18/23 1938 98 %     Weight 09/18/23 1933 65.8 kg (145 lb)     Height 09/18/23 1933 1.651 m (5' 5)     Head Circumference --      Peak Flow --      Pain Score 09/18/23 1932 7     Pain Loc --      Pain Education --      Exclude from Growth Chart --     Most recent vital signs: Vitals:   09/18/23 2300 09/18/23 2323  BP:  (!) 94/47  Pulse: (!) 125 (!) 102  Resp: 17 18  Temp:    SpO2: 97% 97%    General: Awake, alert.  Patient is rocking back and forth, almost a writhing movement, fully awake, alert, coherent, and conversant. CV:  Good peripheral perfusion.  Tachycardia in the 130s.  Because of all the patient's movement, we cannot get a good blood pressure, but she has normal heart sounds and regular rhythm. Resp:  Normal effort. Speaking easily and comfortably despite the shaking movements, no accessory muscle usage  nor intercostal retractions.  Lungs clear to auscultation. Abd:  No distention.  Soft.  Patient reports mild tenderness to palpation throughout the abdomen but there is no evidence of peritonitis and no guarding.   ED Results / Procedures / Treatments   Labs (all labs ordered are listed, but only abnormal results are displayed) Labs Reviewed  BASIC METABOLIC PANEL WITH GFR - Abnormal; Notable for the following components:      Result Value   Potassium 3.4 (*)    Chloride 97 (*)    Glucose, Bld 259 (*)    Anion gap 17 (*)    All other components within normal limits  CBC - Abnormal; Notable for the following components:   WBC 12.1 (*)    All other components within normal limits  CBG MONITORING, ED - Abnormal; Notable for the following components:   Glucose-Capillary 261 (*)    All other components within normal limits  CK  POC URINE PREG, ED  TROPONIN I (HIGH SENSITIVITY)     EKG  ED ECG REPORT I, Darleene Dome, the attending physician, personally viewed and interpreted this ECG.  Date:  09/18/2023 EKG Time: 19: 28 Rate: 140 Rhythm: Sinus tachycardia QRS Axis: normal Intervals: normal ST/T Wave abnormalities: Non-specific ST segment / T-wave changes, but no clear evidence of acute ischemia. Narrative Interpretation: no definitive evidence of acute ischemia; does not meet STEMI criteria.  Computer interpreted as SVT (P waves I believe that the false interpretation was due to extensive artifact from the patient's motion  RADIOLOGY I independently viewed and interpreted the patient's two-view chest x-ray.  No evidence of pneumonia, pneumothorax, nor other acute/emergent abnormality.  I also read the radiologist's report, which confirmed no acute findings.   PROCEDURES:  Critical Care performed: No  Procedures    IMPRESSION / MDM / ASSESSMENT AND PLAN / ED COURSE  I reviewed the triage vital signs and the nursing notes.                              Differential diagnosis includes, but is not limited to, pseudoseizures, nonspecific secondary gain, electrolyte or metabolic abnormality, rhabdomyolysis, medication or drug side effect  Patient's presentation is most consistent with acute presentation with potential threat to life or bodily function.  Labs/studies ordered: High-sensitivity troponin, BMP, CBC, CK, EKG, two-view chest x-ray  Interventions/Medications given:  Medications  lactated ringers  bolus 1,000 mL (0 mLs Intravenous Stopped 09/19/23 0134)  midazolam  (VERSED ) injection 1 mg (1 mg Intravenous Given 09/19/23 0007)    (Note:  hospital course my include additional interventions and/or labs/studies not listed above.)   I am well acquainted with the patient's history and have seen her several times in the past.  Her initial triage blood pressure was likely accurate but we cannot get a good repeat because of her repeated movements.  I believe the tachycardia as result of her ongoing shaking.  She may be volume depleted.  I asked her if she has any control  over the movements, if for example she could hold still while getting an IV.  She says she thought she could.  We will place an IV and provide LR 1 L IV bolus.  She has a slightly prolonged QTc interval, and while I think the droperidol  or haloperidol  would be the best medication for her, I cannot give it due to the QTc.  Instead I will give Versed  1 mg IV to see if this  acts as a calming agent.  I reviewed the medical record including the discharge summary and neurology notes from Dr. Matthews during her hospitalization 6 to 7 weeks ago.  She had no evidence of seizure activity on her EEG and her pseudoseizures are well-documented.  I do not feel she needs any imaging and by her own admission this behavior seems to be voluntary at least to a degree.  She is comfortable with the plan for some fluids and some medicine to help calm her and then she can follow-up as an outpatient.  There is no indication she is having an emergent or life-threatening medical condition at this time.  Her labs are generally reassuring, no significant abnormalities.  Mild anion gap elevation but I believe this is due to the ongoing movements/behaviors and will likely improve with LR 1 L IV bolus.  Nothing to suggest DKA at this point  The patient is on the cardiac monitor to evaluate for evidence of arrhythmia and/or significant heart rate changes.   Clinical Course as of 09/19/23 0139  Fri Sep 19, 2023  0015 The patient's nurse, Will, let me know that the patient completely stopped all of her movements when Will was starting the IV, dropping her heart rate down into the 80s just during the time took to start the IV [CF]  0038 CK Total: 41 Normal CK [CF]  0137 Patient reportedly still rocking back-and-forth (she resumed her movements after the IV was space), but she just informed us  that she is ready to go because her ride is leaving.  There is no evidence she has an emergent medical condition at this time and she is  appropriate for discharge.  I recommended close outpatient follow-up [CF]    Clinical Course User Index [CF] Gordan Huxley, MD     FINAL CLINICAL IMPRESSION(S) / ED DIAGNOSES   Final diagnoses:  Psychogenic nonepileptic seizure     Rx / DC Orders   ED Discharge Orders     None        Note:  This document was prepared using Dragon voice recognition software and may include unintentional dictation errors.   Gordan Huxley, MD 09/19/23 [phone]

## 2023-09-18 NOTE — ED Triage Notes (Addendum)
 Patient C/O mid-sternal chest pain and feeling like she has to writhe and shake that has been going on for four days. Patient states that she does not have any cardiac history besides hypertension. She states that she does smoke marijuana, but does not feel like this is contributing.Patient denies any other sick symptoms at this time. She also states that she does have a history of anxiety but does not take medication. Patient states that this has happened before, and they told her it was all in her head.

## 2023-09-19 LAB — CK: Total CK: 41 U/L (ref 38–234)

## 2023-09-19 NOTE — Discharge Summary (Signed)
 Pt able to hold still without any tremors when asked for IV insertion.

## 2023-09-19 NOTE — ED Notes (Signed)
 Pt called out requesting to be discharged, her ride is on the way. Gordan, MD notified. Pt refused d/c VS at this time. D/c instructions reviewed w/ verbalization of understanding. Pt ambulatory w/ steady gait upon d/c.

## 2023-09-19 NOTE — Discharge Instructions (Signed)
 Your workup in the Emergency Department today was reassuring.  We did not find any specific abnormalities.  We recommend you drink plenty of fluids, take your regular medications and/or any new ones prescribed today, and follow up with the doctor(s) listed in these documents as recommended.  Return to the Emergency Department if you develop new or worsening symptoms that concern you.

## 2023-10-04 DIAGNOSIS — Z419 Encounter for procedure for purposes other than remedying health state, unspecified: Secondary | ICD-10-CM | POA: Diagnosis not present

## 2023-10-22 DIAGNOSIS — R112 Nausea with vomiting, unspecified: Secondary | ICD-10-CM | POA: Diagnosis not present

## 2023-10-22 DIAGNOSIS — R221 Localized swelling, mass and lump, neck: Secondary | ICD-10-CM | POA: Diagnosis not present

## 2023-10-22 DIAGNOSIS — E1165 Type 2 diabetes mellitus with hyperglycemia: Secondary | ICD-10-CM | POA: Diagnosis not present

## 2023-10-23 DIAGNOSIS — E1165 Type 2 diabetes mellitus with hyperglycemia: Secondary | ICD-10-CM | POA: Diagnosis not present

## 2023-10-23 DIAGNOSIS — K3184 Gastroparesis: Secondary | ICD-10-CM | POA: Diagnosis not present

## 2023-10-23 DIAGNOSIS — F17210 Nicotine dependence, cigarettes, uncomplicated: Secondary | ICD-10-CM | POA: Diagnosis not present

## 2023-10-23 DIAGNOSIS — F419 Anxiety disorder, unspecified: Secondary | ICD-10-CM | POA: Diagnosis not present

## 2023-10-23 DIAGNOSIS — R000 Tachycardia, unspecified: Secondary | ICD-10-CM | POA: Diagnosis not present

## 2023-10-23 DIAGNOSIS — R112 Nausea with vomiting, unspecified: Secondary | ICD-10-CM | POA: Diagnosis not present

## 2023-10-23 NOTE — Consults (Signed)
 [facility] Acute Telepsychiatry Initial Inpatient Consult   SERVICE DATE: October 23, 2023  Assessment:   Rachael Gray is a 43 y.o. female with pertinent past medical and psychiatric diagnoses of gastroparesis, seizures and non-epileptic seizures, cannabinoid hyperemesis syndrome, PNES, lupus, type 2 diabetes, collagen vascular disease, and adjustment disorder admitted 10/22/2023 11:45 PM for nausea and vomiting.  Patient was seen in consultation by Psychiatry at the request of Beverley Lang Ash, MD with [facility]) for evaluation of Depression.   Patient endorses symptoms of depression and anxiety, including poor sleep, poor appetite, anhedonia, hopelessness, and feelings of worthlessness in the setting of several psychosocial stressors.  Patient utilizing THC several times a week to manage her anxiety. Explored with patient role of THC in relation to her cannabinoid hyperemesis and worsening anxiety and mood. Patient engaged in motivational interview and reports willingness to consider further reduction vs cessation of THC use.  The patient's current presentation is most consistent with her historical diagnosis of adjustment disorder with mixed anxiety and depressed mood and THC use disorder  Reports previous engagement in group therapy but recently discontinued care due to reports of exhausting her benefits. Psychiatric medication previously prescribed by PCP but reports missing several appointments due to lack of transportation. Recommend at this time initiating Remeron  nightly and hydroxyzine  PRN. As patient reports [facility] will no longer provide her therapy due to exhausting her mental health benefits recommend engagement of SW to assist the patient in finding outpatient services. Consideration for referral to [facility].   Please see below for detailed recommendations.  Risk Assessment: A suicide and violence risk assessment was performed as part of this evaluation. Specific  questioning about thoughts, plans, suicidal intent, and self-harm: Denies thoughts of self-harm. Denies suicidal ideation, plans, or intent. . Risk factors for self-harm/suicide: previous suicide attempt(s), feelings of hopelessness, sense of isolation, barriers to accessing mental health treatment, and current substance abuse. Protective factors against self-harm/suicide:  lack of active SI, no known access to weapons or firearms, restricted access to highly lethal means of suicide, no previous suicide attempts in the last 6 months, motivation for treatment, and utilization of positive coping skills. Risk factors for harm to others: high emotional distress and limited work history. Protective factors against harm to others: no known history of violence towards others, no known violence towards others in the last 6 months, no known history of threats of harm towards others, no known homicidal ideation in the last 6 months, no commands hallucinations to harm others in the last 6 months, no active symptoms of psychosis, and low impulsivity.   Recommendations:   Safety Considerations: -- Based on this psychiatric evaluation, we estimate the patient to be at low risk for suicide in the current setting. We recommend routine level of observation per hospital policy.   IVC Status:  This patient does NOT meet IVC criteria due to lack of acute or imminent danger to self or others. -- Would not recommend initiation of IVC process at this time.  Medication Recommendations:  --Remeron  7.5 mg PO HS  --Hydroxyzine  25 mg PO QID PRN anxiety --Patient would benefit from consult to pastoral care for her spiritual needs  --Daily 1ppd smoker; consideration for nicotine  patch    Medical Decision Making Capacity:  -- A formal capacity assessement was not performed as a part of this evaluation.  If specific capacity questions arise, please contact our team as below.   Further Work-up:  -- Continue to work-up and  treat possible medical conditions that may be contributing to current presentation.  -- While the patient is receiving medications that may prolong QTc and increase risk for torsades:    - MONITOR and KEEP Mg>2 and K>4     - MONITOR QTc regularly.  If QTc on tele strip >457ms, obtain 12-lead EKG.  Disposition:  -- There are no psychiatric contraindications to discharging this patient when medically appropriate. -- We have encouraged the patient to establish outpatient mental health care by contacting either their insurance carrier or their local Managed Care Organization, [facility] (Phone [phone]; Crisis Line [phone]).  PLEASE INCLUDE IN PATIENT AVS. --Consideration for consult to SW to connect patient with outpatient mental health support   Behavioral / Environmental:  -- Utilize compassion and acknowledge the patient's experiences while setting clear and realistic expectations for care.   Outpatient resources:  Walk-in clinics and Crisis centers: ++[place]++ [facility], [place], [place]  Same Day Access Hours:  Monday, Wednesday and Friday, 8am - 3pm Walk-In Crisis Hours: 7 days/wk, 8am - 8pm [address], [place], [place] [address]  Have help come to you.  ++MOBILE CRISIS ++  The Mobile Crisis Response Team provides a number of integrated services including: . Evaluation of immediate need for crisis/emergency services . Crisis intervention and assistance in de-escalating the situation . Risk assessment and evaluation for more intensive services  . Short-term crisis management until other supports/services are in place  . Liaison between individual, families, providers and emergency services  . Coordination of appropriate services for ongoing treatment and follow up   LOCAL MOBILE CRISIS TEAMS:  [place]: [employer], [phone]   If you have Medicaid or are uninsured you can also contact. COUNTY AGENCY  [place],  [place], [place], [place] [employer] is available 24 hours a day, 7 days a week. [phone]  [place], [place] [facility] is available 24 hours a day, 7 days a week. [phone]  [place] [facility] is available 24 hours a day, 7 days a week. [phone]  For a full list please visit [email or web address]     I was outside the hospital.  I spent 15 minutes on the real-time audio and video with the patient for a a consultation. Total time was 98 minutes and over 50% of the service was counseling and/or coordination of care within the patient unit.    The patient and/or parent/guardian has been advised of the potential risks and limitations of this mode of treatment (including, but not limited to, the absence of in-person examination) and has agreed to be treated using telemedicine. The  Patient's, patient's guardian's and/or patient's family's questions regarding telemedicine have been answered.    Roselie JONELLE Pate, DO    Subjective:   REASON FOR CONSULT: Patient is seen in consultation at the request of Beverley Lang Ash, MD for evaluation of Depression and Medication management.  Consult Question: Significant depression, previously on medication, currently unmedicated 2/2 loss of care   PDMP and Rx medication dispense reports: Unremarkable  PATIENT INTERVIEW: Patient tearful, reports she is having a rough year. Reports her 42 year old is now living with her father and she is bummed out. Reports she has been experiencing depression and anxiety. Reports poor sleep and poor appetite. Denies having hobbies or interest in hobbies at this time. Reports hopelessness, worthlessness. Denies current or past engagement in therapy. Reports taking Remeron  in the past and finding it helpful. Reports previously  her PCP was prescribing her psychiatric medication. Reports she  stopped going to her PCP due to lack of transportation.   Reports one prior suicide attempt via cutting at age 9. Reports past struggles with passive suicidal ideations. Denies suicidal ideation at this time. Reports she last experienced SI a long time ago.  Reports her 61 year old daughter is part of her support system. Reports her daughter does the shopping for the family. Reports she struggles to do the shopping due to feeling overwhelmed by the amount of people. Reports she is largely withdrawn to her home and her house.   Reports feeling anxious and overwhelmed her in the hospital. Reports shaking. Reports requesting time to herself, breathing, and saying gospel. Discussed with patient pastoral support. Patient request consult to pastoral care. Discussed with patient I will relay this request to her primary team.   Reports for the past 3 months she has been working to reduce her THC use. Explored with patient the connection between her THC use and her nausea vomiting as well as the connection between her THC use and worsening anxiety.   Reports engaging in group therapy with [facility] up until a month ago. Reports she was told her medicaid would not longer cover therapy services.     COLLATERAL INFORMATION:  Deferred due to lack of acute safety concerns and patients willingness to engage in interview and seek resources for support  PAST PSYCHIATRIC HISTORY: Prior psychiatric diagnoses: adjustment disorder with mixed anxiety and depressed mood, THC use disorder Psychiatric hospitalizations: Denies  Inpatient substance abuse treatment: Denies  Suicide attempts: Reports a past Suicide attempt at age 32 via cutting herself  Non-suicidal self-injury: history of cutting Last cut two years ago  Medication trials/compliance: Lexapro, remeron , trazadone, hydroxyzine , Cymbalta,   Current/previous mental health treatment: none Previouslt at therapy at [facility] until they stopped covering her treatment.    SOCIAL HISTORY: - Living situation: the patient lives with their family. (32 year old daughter) - Family Contact: Daughter - Relationship Status: Single  - Children: Yes; 2 daughters  - Income/Employment/Disability: Currently Unemployed, vocational support not requested  - Education: 11th grade  - Current/Prior Legal: None - Access to Firearms: None   SUBSTANCE USE: Tobacco use: Current use, cigarettes (1 pack/day). Alcohol use: Denies current use. Drug use: UDS (+) THC 2-3 days out of the week   FAMILY HISTORY: The patient's family history includes Cancer in her maternal grandmother; Diabetes in her maternal grandmother; Heart disease in her father..  Medical/surgical history, medications, and allergies reviewed and updated as appropriate.  Objective:   VITAL SIGNS: BP 129/75   Pulse 99   Temp 36.8 C (98.3 F) (Tympanic)   Resp 18   Ht 165.1 cm (5' 5)   Wt 65.8 kg (145 lb)   SpO2 98%   BMI 24.13 kg/m   Mental Status Exam: Appearance:   disheveled, sitting in bed, appears older than stated age, uncomfortable appearing, and tired appearing  Behavior:  appropriate eye contact and fidgety  Motor:  no abnormal movements noted  Speech/Language:   normal rate, rhythm, volume  Mood:   Terrible  Affect:  Anxious and tearful  Thought process and Associations:  logical, linear, clear, coherent, goal directed  Thought Content:     No grandiose, self-referential, persecutory, or paranoid delusions noted  Perceptual disturbances:    denies auditory and visual hallucinations and behavior not concerning for response to internal stimuli   Thoughts of self harm  denies thoughts of self-harm.  Denies suicidal ideation, plans, or intent.   Thoughts of harm to others  denies any thought of harming others/no homicidal ideation  Orientation:  Oriented to person, place, time, and general circumstances  Attention and Concentration:  Able to fully concentrate and attend  Memory:  not  formally tested, but grossly intact   Fund of knowledge:   Consistent with level of education and development  Insight:    Fair  Judgment:   Fair  Impulse Control:  Fair   TEST RESULTS: I have reviewed the labs, studies, and imaging from the last 24 hours if available.

## 2023-10-24 DIAGNOSIS — R000 Tachycardia, unspecified: Secondary | ICD-10-CM | POA: Diagnosis not present

## 2023-10-24 DIAGNOSIS — R112 Nausea with vomiting, unspecified: Secondary | ICD-10-CM | POA: Diagnosis not present

## 2023-10-24 DIAGNOSIS — F419 Anxiety disorder, unspecified: Secondary | ICD-10-CM | POA: Diagnosis not present

## 2023-10-24 DIAGNOSIS — E1165 Type 2 diabetes mellitus with hyperglycemia: Secondary | ICD-10-CM | POA: Diagnosis not present

## 2023-10-28 DIAGNOSIS — Q279 Congenital malformation of peripheral vascular system, unspecified: Secondary | ICD-10-CM | POA: Diagnosis not present

## 2023-10-28 DIAGNOSIS — R221 Localized swelling, mass and lump, neck: Secondary | ICD-10-CM | POA: Diagnosis not present

## 2023-10-31 DIAGNOSIS — F32A Depression, unspecified: Secondary | ICD-10-CM | POA: Diagnosis not present

## 2023-10-31 DIAGNOSIS — R7989 Other specified abnormal findings of blood chemistry: Secondary | ICD-10-CM | POA: Diagnosis not present

## 2023-10-31 DIAGNOSIS — Z7689 Persons encountering health services in other specified circumstances: Secondary | ICD-10-CM | POA: Diagnosis not present

## 2023-10-31 DIAGNOSIS — R112 Nausea with vomiting, unspecified: Secondary | ICD-10-CM | POA: Diagnosis not present

## 2023-10-31 DIAGNOSIS — R030 Elevated blood-pressure reading, without diagnosis of hypertension: Secondary | ICD-10-CM | POA: Diagnosis not present

## 2023-10-31 DIAGNOSIS — F419 Anxiety disorder, unspecified: Secondary | ICD-10-CM | POA: Diagnosis not present

## 2023-10-31 DIAGNOSIS — R259 Unspecified abnormal involuntary movements: Secondary | ICD-10-CM | POA: Diagnosis not present

## 2023-10-31 DIAGNOSIS — E1165 Type 2 diabetes mellitus with hyperglycemia: Secondary | ICD-10-CM | POA: Diagnosis not present

## 2023-11-04 DIAGNOSIS — Z419 Encounter for procedure for purposes other than remedying health state, unspecified: Secondary | ICD-10-CM | POA: Diagnosis not present

## 2023-11-06 DIAGNOSIS — M542 Cervicalgia: Secondary | ICD-10-CM | POA: Diagnosis not present

## 2023-11-06 DIAGNOSIS — R221 Localized swelling, mass and lump, neck: Secondary | ICD-10-CM | POA: Diagnosis not present

## 2023-12-05 DIAGNOSIS — Z419 Encounter for procedure for purposes other than remedying health state, unspecified: Secondary | ICD-10-CM | POA: Diagnosis not present

## 2024-02-04 DIAGNOSIS — Z419 Encounter for procedure for purposes other than remedying health state, unspecified: Secondary | ICD-10-CM | POA: Diagnosis not present
